# Patient Record
Sex: Male | Born: 1950
Health system: Southern US, Community
[De-identification: ages and names within clinical notes are randomized; demographics above are authoritative.]

## PROBLEM LIST (undated history)

## (undated) ENCOUNTER — Emergency Department (HOSPITAL_COMMUNITY): Admission: EM | Payer: Self-pay | Source: Home / Self Care

## (undated) DIAGNOSIS — Z950 Presence of cardiac pacemaker: Secondary | ICD-10-CM

## (undated) DIAGNOSIS — L259 Unspecified contact dermatitis, unspecified cause: Secondary | ICD-10-CM

## (undated) DIAGNOSIS — I10 Essential (primary) hypertension: Secondary | ICD-10-CM

## (undated) DIAGNOSIS — N179 Acute kidney failure, unspecified: Secondary | ICD-10-CM

## (undated) DIAGNOSIS — I2510 Atherosclerotic heart disease of native coronary artery without angina pectoris: Secondary | ICD-10-CM

## (undated) DIAGNOSIS — E109 Type 1 diabetes mellitus without complications: Secondary | ICD-10-CM

## (undated) DIAGNOSIS — N189 Chronic kidney disease, unspecified: Secondary | ICD-10-CM

## (undated) DIAGNOSIS — I6350 Cerebral infarction due to unspecified occlusion or stenosis of unspecified cerebral artery: Secondary | ICD-10-CM

## (undated) DIAGNOSIS — I509 Heart failure, unspecified: Secondary | ICD-10-CM

## (undated) DIAGNOSIS — E785 Hyperlipidemia, unspecified: Secondary | ICD-10-CM

## (undated) DIAGNOSIS — Z95810 Presence of automatic (implantable) cardiac defibrillator: Secondary | ICD-10-CM

## (undated) DIAGNOSIS — I219 Acute myocardial infarction, unspecified: Secondary | ICD-10-CM

## (undated) DIAGNOSIS — E039 Hypothyroidism, unspecified: Secondary | ICD-10-CM

## (undated) DIAGNOSIS — Z87442 Personal history of urinary calculi: Secondary | ICD-10-CM

## (undated) HISTORY — PX: TONSILLECTOMY: SUR1361

## (undated) HISTORY — DX: Hyperlipidemia, unspecified: E78.5

## (undated) HISTORY — PX: WISDOM TOOTH EXTRACTION: SHX21

## (undated) HISTORY — DX: Atherosclerotic heart disease of native coronary artery without angina pectoris: I25.10

## (undated) HISTORY — DX: Acute kidney failure, unspecified: N17.9

## (undated) HISTORY — PX: TONSILECTOMY, ADENOIDECTOMY, BILATERAL MYRINGOTOMY AND TUBES: SHX2538

## (undated) HISTORY — DX: Unspecified contact dermatitis, unspecified cause: L25.9

## (undated) HISTORY — DX: Cerebral infarction due to unspecified occlusion or stenosis of unspecified cerebral artery: I63.50

## (undated) HISTORY — DX: Type 1 diabetes mellitus without complications: E10.9

## (undated) HISTORY — DX: Essential (primary) hypertension: I10

## (undated) HISTORY — PX: INSERT / REPLACE / REMOVE PACEMAKER: SUR710

## (undated) HISTORY — PX: CORONARY STENT PLACEMENT: SHX1402

## (undated) HISTORY — DX: Chronic kidney disease, unspecified: N18.9

---
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
## 1997-06-28 ENCOUNTER — Ambulatory Visit (HOSPITAL_COMMUNITY): Admission: RE | Admit: 1997-06-28 | Discharge: 1997-06-28 | Payer: Self-pay | Admitting: Podiatry

## 2001-08-04 ENCOUNTER — Encounter (HOSPITAL_COMMUNITY): Admission: RE | Admit: 2001-08-04 | Discharge: 2001-11-02 | Payer: Self-pay | Admitting: Cardiovascular Disease

## 2001-08-26 ENCOUNTER — Ambulatory Visit (HOSPITAL_COMMUNITY): Admission: RE | Admit: 2001-08-26 | Discharge: 2001-08-26 | Payer: Self-pay | Admitting: Cardiovascular Disease

## 2001-09-01 ENCOUNTER — Encounter: Admission: RE | Admit: 2001-09-01 | Discharge: 2001-11-04 | Payer: Self-pay | Admitting: Cardiovascular Disease

## 2002-01-15 ENCOUNTER — Encounter: Payer: Self-pay | Admitting: Cardiovascular Disease

## 2002-01-15 ENCOUNTER — Ambulatory Visit (HOSPITAL_COMMUNITY): Admission: RE | Admit: 2002-01-15 | Discharge: 2002-01-15 | Payer: Self-pay | Admitting: Cardiovascular Disease

## 2005-01-07 ENCOUNTER — Ambulatory Visit: Payer: Self-pay | Admitting: Internal Medicine

## 2005-01-18 ENCOUNTER — Ambulatory Visit: Payer: Self-pay | Admitting: Internal Medicine

## 2005-03-01 ENCOUNTER — Ambulatory Visit: Payer: Self-pay | Admitting: Internal Medicine

## 2005-03-06 ENCOUNTER — Encounter: Admission: RE | Admit: 2005-03-06 | Discharge: 2005-06-04 | Payer: Self-pay | Admitting: Internal Medicine

## 2005-05-02 ENCOUNTER — Ambulatory Visit: Payer: Self-pay | Admitting: Internal Medicine

## 2005-07-04 ENCOUNTER — Ambulatory Visit: Payer: Self-pay | Admitting: Internal Medicine

## 2005-09-06 ENCOUNTER — Ambulatory Visit: Payer: Self-pay | Admitting: Endocrinology

## 2005-09-17 ENCOUNTER — Ambulatory Visit: Payer: Self-pay | Admitting: Endocrinology

## 2005-10-30 ENCOUNTER — Ambulatory Visit: Payer: Self-pay | Admitting: Endocrinology

## 2006-01-03 ENCOUNTER — Ambulatory Visit: Payer: Self-pay | Admitting: Endocrinology

## 2006-04-11 ENCOUNTER — Ambulatory Visit: Payer: Self-pay | Admitting: Internal Medicine

## 2006-04-11 LAB — CONVERTED CEMR LAB
ALT: 40 units/L (ref 0–40)
AST: 35 units/L (ref 0–37)
Albumin: 3.4 g/dL — ABNORMAL LOW (ref 3.5–5.2)
Alkaline Phosphatase: 67 units/L (ref 39–117)
BUN: 19 mg/dL (ref 6–23)
Basophils Absolute: 0.1 10*3/uL (ref 0.0–0.1)
Basophils Relative: 0.9 % (ref 0.0–1.0)
Bilirubin, Direct: 0.1 mg/dL (ref 0.0–0.3)
CO2: 32 meq/L (ref 19–32)
Calcium: 9.3 mg/dL (ref 8.4–10.5)
Chloride: 104 meq/L (ref 96–112)
Cholesterol: 154 mg/dL (ref 0–200)
Creatinine, Ser: 1.1 mg/dL (ref 0.4–1.5)
Creatinine,U: 197.7 mg/dL
Eosinophils Absolute: 0.4 10*3/uL (ref 0.0–0.6)
Eosinophils Relative: 4.9 % (ref 0.0–5.0)
GFR calc Af Amer: 90 mL/min
GFR calc non Af Amer: 74 mL/min
Glucose, Bld: 167 mg/dL — ABNORMAL HIGH (ref 70–99)
HCT: 43.9 % (ref 39.0–52.0)
HDL: 54 mg/dL (ref >39.0)
Hemoglobin: 15.4 g/dL (ref 13.0–17.0)
Hgb A1c MFr Bld: 7.2 % — ABNORMAL HIGH (ref 4.6–6.0)
LDL Cholesterol: 87 mg/dL (ref 0–99)
Lymphocytes Relative: 18.3 % (ref 12.0–46.0)
MCHC: 35 g/dL (ref 30.0–36.0)
MCV: 95 fL (ref 78.0–100.0)
Microalb Creat Ratio: 4 mg/g (ref 0.0–30.0)
Microalb, Ur: 0.8 mg/dL (ref 0.0–1.9)
Monocytes Absolute: 1.5 10*3/uL — ABNORMAL HIGH (ref 0.2–0.7)
Monocytes Relative: 17.6 % — ABNORMAL HIGH (ref 3.0–11.0)
Neutro Abs: 5 10*3/uL (ref 1.4–7.7)
Neutrophils Relative %: 58.3 % (ref 43.0–77.0)
PSA: 0.95 ng/mL (ref 0.10–4.00)
Platelets: 210 10*3/uL (ref 150–400)
Potassium: 5 meq/L (ref 3.5–5.1)
RBC: 4.63 M/uL (ref 4.22–5.81)
RDW: 12.1 % (ref 11.5–14.6)
Sodium: 141 meq/L (ref 135–145)
TSH: 3.98 microintl units/mL (ref 0.35–5.50)
Total Bilirubin: 0.7 mg/dL (ref 0.3–1.2)
Total CHOL/HDL Ratio: 2.9
Total Protein: 6.5 g/dL (ref 6.0–8.3)
Triglycerides: 65 mg/dL (ref 0–149)
VLDL: 13 mg/dL (ref 0–40)
WBC: 8.6 10*3/uL (ref 4.5–10.5)

## 2006-04-18 ENCOUNTER — Ambulatory Visit: Payer: Self-pay | Admitting: Internal Medicine

## 2006-05-09 ENCOUNTER — Ambulatory Visit: Payer: Self-pay | Admitting: Endocrinology

## 2006-05-12 ENCOUNTER — Ambulatory Visit: Payer: Self-pay

## 2006-05-14 ENCOUNTER — Ambulatory Visit: Payer: Self-pay | Admitting: Cardiovascular Disease

## 2006-05-30 ENCOUNTER — Ambulatory Visit: Payer: Self-pay | Admitting: Cardiovascular Disease

## 2006-05-30 LAB — CONVERTED CEMR LAB
BUN: 21 mg/dL (ref 6–23)
Basophils Absolute: 0 10*3/uL (ref 0.0–0.1)
Basophils Relative: 0.5 % (ref 0.0–1.0)
CO2: 31 meq/L (ref 19–32)
Calcium: 9 mg/dL (ref 8.4–10.5)
Chloride: 110 meq/L (ref 96–112)
Creatinine, Ser: 1.2 mg/dL (ref 0.4–1.5)
Eosinophils Absolute: 0.3 10*3/uL (ref 0.0–0.6)
Eosinophils Relative: 3.8 % (ref 0.0–5.0)
GFR calc Af Amer: 81 mL/min
GFR calc non Af Amer: 67 mL/min
Glucose, Bld: 122 mg/dL — ABNORMAL HIGH (ref 70–99)
HCT: 40.7 % (ref 39.0–52.0)
Hemoglobin: 14.4 g/dL (ref 13.0–17.0)
INR: 0.8 — ABNORMAL LOW (ref 0.9–2.0)
Lymphocytes Relative: 23.5 % (ref 12.0–46.0)
MCHC: 35.4 g/dL (ref 30.0–36.0)
MCV: 94.7 fL (ref 78.0–100.0)
Monocytes Absolute: 1 10*3/uL — ABNORMAL HIGH (ref 0.2–0.7)
Monocytes Relative: 12.1 % — ABNORMAL HIGH (ref 3.0–11.0)
Neutro Abs: 4.7 10*3/uL (ref 1.4–7.7)
Neutrophils Relative %: 60.1 % (ref 43.0–77.0)
Platelets: 232 10*3/uL (ref 150–400)
Potassium: 4.2 meq/L (ref 3.5–5.1)
Prothrombin Time: 10.9 s (ref 10.0–14.0)
RBC: 4.3 M/uL (ref 4.22–5.81)
RDW: 11.9 % (ref 11.5–14.6)
Sodium: 144 meq/L (ref 135–145)
WBC: 7.9 10*3/uL (ref 4.5–10.5)
aPTT: 21.1 s — ABNORMAL LOW (ref 26.5–36.5)

## 2006-06-02 ENCOUNTER — Ambulatory Visit: Payer: Self-pay | Admitting: Cardiovascular Disease

## 2006-06-02 ENCOUNTER — Inpatient Hospital Stay (HOSPITAL_BASED_OUTPATIENT_CLINIC_OR_DEPARTMENT_OTHER): Admission: RE | Admit: 2006-06-02 | Discharge: 2006-06-02 | Payer: Self-pay | Admitting: Cardiovascular Disease

## 2006-06-03 ENCOUNTER — Ambulatory Visit (HOSPITAL_COMMUNITY): Admission: RE | Admit: 2006-06-03 | Discharge: 2006-06-03 | Payer: Self-pay | Admitting: Cardiovascular Disease

## 2006-06-16 ENCOUNTER — Ambulatory Visit: Payer: Self-pay | Admitting: Internal Medicine

## 2006-08-06 ENCOUNTER — Ambulatory Visit: Payer: Self-pay | Admitting: Internal Medicine

## 2006-08-11 DIAGNOSIS — I10 Essential (primary) hypertension: Secondary | ICD-10-CM

## 2006-08-11 DIAGNOSIS — I252 Old myocardial infarction: Secondary | ICD-10-CM

## 2006-08-11 DIAGNOSIS — E785 Hyperlipidemia, unspecified: Secondary | ICD-10-CM

## 2006-08-11 DIAGNOSIS — Z9861 Coronary angioplasty status: Secondary | ICD-10-CM

## 2006-08-11 DIAGNOSIS — I2510 Atherosclerotic heart disease of native coronary artery without angina pectoris: Secondary | ICD-10-CM

## 2006-08-11 DIAGNOSIS — E109 Type 1 diabetes mellitus without complications: Secondary | ICD-10-CM

## 2006-08-11 HISTORY — DX: Hyperlipidemia, unspecified: E78.5

## 2006-08-11 HISTORY — DX: Type 1 diabetes mellitus without complications: E10.9

## 2006-08-11 HISTORY — DX: Atherosclerotic heart disease of native coronary artery without angina pectoris: I25.10

## 2006-08-11 HISTORY — DX: Essential (primary) hypertension: I10

## 2006-08-15 ENCOUNTER — Ambulatory Visit: Payer: Self-pay | Admitting: Endocrinology

## 2006-10-31 ENCOUNTER — Encounter: Payer: Self-pay | Admitting: *Deleted

## 2006-12-13 ENCOUNTER — Encounter: Payer: Self-pay | Admitting: Emergency Medicine

## 2006-12-14 ENCOUNTER — Ambulatory Visit: Payer: Self-pay | Admitting: Cardiology

## 2006-12-14 ENCOUNTER — Ambulatory Visit: Payer: Self-pay | Admitting: *Deleted

## 2006-12-14 ENCOUNTER — Ambulatory Visit: Payer: Self-pay | Admitting: Internal Medicine

## 2006-12-14 ENCOUNTER — Inpatient Hospital Stay (HOSPITAL_COMMUNITY): Admission: EM | Admit: 2006-12-14 | Discharge: 2006-12-15 | Payer: Self-pay | Admitting: Internal Medicine

## 2006-12-15 ENCOUNTER — Ambulatory Visit: Payer: Self-pay | Admitting: Internal Medicine

## 2006-12-15 ENCOUNTER — Encounter: Payer: Self-pay | Admitting: Internal Medicine

## 2006-12-15 ENCOUNTER — Telehealth: Payer: Self-pay | Admitting: Internal Medicine

## 2006-12-19 ENCOUNTER — Ambulatory Visit: Payer: Self-pay | Admitting: Internal Medicine

## 2006-12-19 DIAGNOSIS — Z8673 Personal history of transient ischemic attack (TIA), and cerebral infarction without residual deficits: Secondary | ICD-10-CM

## 2006-12-19 DIAGNOSIS — I6350 Cerebral infarction due to unspecified occlusion or stenosis of unspecified cerebral artery: Secondary | ICD-10-CM

## 2006-12-19 HISTORY — DX: Cerebral infarction due to unspecified occlusion or stenosis of unspecified cerebral artery: I63.50

## 2006-12-23 ENCOUNTER — Telehealth: Payer: Self-pay | Admitting: Internal Medicine

## 2007-01-20 ENCOUNTER — Ambulatory Visit: Payer: Self-pay | Admitting: Internal Medicine

## 2007-02-09 ENCOUNTER — Ambulatory Visit: Payer: Self-pay | Admitting: Internal Medicine

## 2007-02-11 ENCOUNTER — Telehealth: Payer: Self-pay | Admitting: Internal Medicine

## 2007-02-19 ENCOUNTER — Ambulatory Visit: Payer: Self-pay | Admitting: Endocrinology

## 2007-03-27 ENCOUNTER — Encounter: Payer: Self-pay | Admitting: Endocrinology

## 2007-06-05 ENCOUNTER — Ambulatory Visit: Payer: Self-pay | Admitting: Internal Medicine

## 2007-06-05 LAB — CONVERTED CEMR LAB
ALT: 24 units/L (ref 0–53)
AST: 25 units/L (ref 0–37)
Albumin: 3.4 g/dL — ABNORMAL LOW (ref 3.5–5.2)
Alkaline Phosphatase: 61 units/L (ref 39–117)
Bilirubin, Direct: 0.1 mg/dL (ref 0.0–0.3)
Cholesterol: 155 mg/dL (ref 0–200)
HDL: 39.2 mg/dL (ref >39.0)
Hgb A1c MFr Bld: 8.1 % — ABNORMAL HIGH (ref 4.6–6.0)
LDL Cholesterol: 101 mg/dL — ABNORMAL HIGH (ref 0–99)
Total Bilirubin: 0.8 mg/dL (ref 0.3–1.2)
Total CHOL/HDL Ratio: 4
Total Protein: 5.8 g/dL — ABNORMAL LOW (ref 6.0–8.3)
Triglycerides: 73 mg/dL (ref 0–149)
VLDL: 15 mg/dL (ref 0–40)

## 2007-06-26 ENCOUNTER — Ambulatory Visit: Payer: Self-pay | Admitting: Internal Medicine

## 2007-10-09 ENCOUNTER — Ambulatory Visit: Payer: Self-pay | Admitting: Internal Medicine

## 2007-10-09 LAB — CONVERTED CEMR LAB
ALT: 24 units/L (ref 0–53)
AST: 25 units/L (ref 0–37)
Albumin: 3.2 g/dL — ABNORMAL LOW (ref 3.5–5.2)
Alkaline Phosphatase: 60 units/L (ref 39–117)
BUN: 17 mg/dL (ref 6–23)
Bilirubin, Direct: 0.1 mg/dL (ref 0.0–0.3)
CO2: 31 meq/L (ref 19–32)
Calcium: 8.7 mg/dL (ref 8.4–10.5)
Chloride: 113 meq/L — ABNORMAL HIGH (ref 96–112)
Cholesterol: 137 mg/dL (ref 0–200)
Creatinine, Ser: 1.1 mg/dL (ref 0.4–1.5)
GFR calc Af Amer: 89 mL/min
GFR calc non Af Amer: 74 mL/min
Glucose, Bld: 100 mg/dL — ABNORMAL HIGH (ref 70–99)
HDL: 43.7 mg/dL (ref >39.0)
Hgb A1c MFr Bld: 7.5 % — ABNORMAL HIGH (ref 4.6–6.0)
LDL Cholesterol: 81 mg/dL (ref 0–99)
Potassium: 4.5 meq/L (ref 3.5–5.1)
Sodium: 147 meq/L — ABNORMAL HIGH (ref 135–145)
Total Bilirubin: 0.7 mg/dL (ref 0.3–1.2)
Total CHOL/HDL Ratio: 3.1
Total Protein: 6.2 g/dL (ref 6.0–8.3)
Triglycerides: 64 mg/dL (ref 0–149)
VLDL: 13 mg/dL (ref 0–40)

## 2007-10-23 ENCOUNTER — Ambulatory Visit: Payer: Self-pay | Admitting: Internal Medicine

## 2007-11-20 ENCOUNTER — Encounter: Payer: Self-pay | Admitting: Internal Medicine

## 2007-12-24 ENCOUNTER — Telehealth (INDEPENDENT_AMBULATORY_CARE_PROVIDER_SITE_OTHER): Payer: Self-pay | Admitting: *Deleted

## 2008-01-28 ENCOUNTER — Telehealth (INDEPENDENT_AMBULATORY_CARE_PROVIDER_SITE_OTHER): Payer: Self-pay | Admitting: *Deleted

## 2008-02-12 ENCOUNTER — Ambulatory Visit: Payer: Self-pay | Admitting: Internal Medicine

## 2008-02-12 LAB — CONVERTED CEMR LAB
ALT: 26 units/L (ref 0–53)
AST: 25 units/L (ref 0–37)
Albumin: 3.6 g/dL (ref 3.5–5.2)
Alkaline Phosphatase: 76 units/L (ref 39–117)
BUN: 19 mg/dL (ref 6–23)
Bilirubin, Direct: 0.1 mg/dL (ref 0.0–0.3)
CO2: 29 meq/L (ref 19–32)
Calcium: 9.1 mg/dL (ref 8.4–10.5)
Chloride: 108 meq/L (ref 96–112)
Creatinine, Ser: 1.2 mg/dL (ref 0.4–1.5)
GFR calc Af Amer: 81 mL/min
GFR calc non Af Amer: 67 mL/min
Glucose, Bld: 317 mg/dL — ABNORMAL HIGH (ref 70–99)
Hgb A1c MFr Bld: 8.3 % — ABNORMAL HIGH (ref 4.6–6.0)
Potassium: 5.3 meq/L — ABNORMAL HIGH (ref 3.5–5.1)
Sodium: 142 meq/L (ref 135–145)
Total Bilirubin: 0.7 mg/dL (ref 0.3–1.2)
Total Protein: 6.2 g/dL (ref 6.0–8.3)

## 2008-02-26 ENCOUNTER — Ambulatory Visit: Payer: Self-pay | Admitting: Internal Medicine

## 2008-04-13 ENCOUNTER — Ambulatory Visit: Payer: Self-pay | Admitting: Endocrinology

## 2008-06-17 ENCOUNTER — Ambulatory Visit: Payer: Self-pay | Admitting: Internal Medicine

## 2008-06-17 LAB — CONVERTED CEMR LAB
Albumin: 3.4 g/dL — ABNORMAL LOW (ref 3.5–5.2)
Alkaline Phosphatase: 66 units/L (ref 39–117)
BUN: 17 mg/dL (ref 6–23)
Bilirubin, Direct: 0 mg/dL (ref 0.0–0.3)
CO2: 30 meq/L (ref 19–32)
Chloride: 112 meq/L (ref 96–112)
Cholesterol: 152 mg/dL (ref 0–200)
Creatinine, Ser: 1.3 mg/dL (ref 0.4–1.5)
Hgb A1c MFr Bld: 8 % — ABNORMAL HIGH (ref 4.6–6.5)
LDL Cholesterol: 94 mg/dL (ref 0–99)
Potassium: 4.6 meq/L (ref 3.5–5.1)
Total CHOL/HDL Ratio: 4
Total Protein: 6.2 g/dL (ref 6.0–8.3)
VLDL: 14.6 mg/dL (ref 0.0–40.0)

## 2008-07-07 ENCOUNTER — Ambulatory Visit: Payer: Self-pay | Admitting: Internal Medicine

## 2008-11-18 ENCOUNTER — Ambulatory Visit: Payer: Self-pay | Admitting: Internal Medicine

## 2008-11-18 LAB — CONVERTED CEMR LAB
ALT: 42 units/L (ref 0–53)
Chloride: 105 meq/L (ref 96–112)
Cholesterol: 173 mg/dL (ref 0–200)
GFR calc non Af Amer: 66.28 mL/min (ref >60)
HDL: 50.9 mg/dL (ref >39.00)
Hgb A1c MFr Bld: 8 % — ABNORMAL HIGH (ref 4.6–6.5)
LDL Cholesterol: 109 mg/dL — ABNORMAL HIGH (ref 0–99)
Potassium: 4 meq/L (ref 3.5–5.1)
Sodium: 139 meq/L (ref 135–145)
Total Protein: 6.2 g/dL (ref 6.0–8.3)
VLDL: 13.4 mg/dL (ref 0.0–40.0)

## 2008-12-02 ENCOUNTER — Ambulatory Visit: Payer: Self-pay | Admitting: Internal Medicine

## 2009-03-10 ENCOUNTER — Ambulatory Visit: Payer: Self-pay | Admitting: Internal Medicine

## 2009-03-10 LAB — CONVERTED CEMR LAB
ALT: 26 units/L (ref 0–53)
AST: 23 units/L (ref 0–37)
Albumin: 3.5 g/dL (ref 3.5–5.2)
Calcium: 8.8 mg/dL (ref 8.4–10.5)
Cholesterol: 138 mg/dL (ref 0–200)
GFR calc non Af Amer: 60.36 mL/min (ref >60)
Glucose, Bld: 161 mg/dL — ABNORMAL HIGH (ref 70–99)
HDL: 57 mg/dL (ref >39.00)
Hgb A1c MFr Bld: 8.2 % — ABNORMAL HIGH (ref 4.6–6.5)
Sodium: 142 meq/L (ref 135–145)
Total Protein: 6.3 g/dL (ref 6.0–8.3)
Triglycerides: 62 mg/dL (ref 0.0–149.0)
VLDL: 12.4 mg/dL (ref 0.0–40.0)

## 2009-03-24 ENCOUNTER — Ambulatory Visit: Payer: Self-pay | Admitting: Internal Medicine

## 2009-04-21 ENCOUNTER — Ambulatory Visit: Payer: Self-pay | Admitting: Endocrinology

## 2009-04-21 LAB — CONVERTED CEMR LAB
Creatinine,U: 31.1 mg/dL
Microalb, Ur: 0.2 mg/dL (ref 0.0–1.9)

## 2009-06-16 ENCOUNTER — Ambulatory Visit: Payer: Self-pay | Admitting: Endocrinology

## 2009-07-14 ENCOUNTER — Ambulatory Visit: Payer: Self-pay | Admitting: Internal Medicine

## 2009-07-14 LAB — CONVERTED CEMR LAB
Albumin: 3.6 g/dL (ref 3.5–5.2)
Alkaline Phosphatase: 68 units/L (ref 39–117)
CO2: 31 meq/L (ref 19–32)
Chloride: 107 meq/L (ref 96–112)
Creatinine, Ser: 1.1 mg/dL (ref 0.4–1.5)
Glucose, Bld: 141 mg/dL — ABNORMAL HIGH (ref 70–99)
LDL Cholesterol: 99 mg/dL (ref 0–99)
Sodium: 146 meq/L — ABNORMAL HIGH (ref 135–145)
Total Bilirubin: 0.9 mg/dL (ref 0.3–1.2)
Total CHOL/HDL Ratio: 3
Triglycerides: 78 mg/dL (ref 0.0–149.0)

## 2009-08-08 ENCOUNTER — Ambulatory Visit: Payer: Self-pay | Admitting: Internal Medicine

## 2009-08-24 ENCOUNTER — Ambulatory Visit: Payer: Self-pay | Admitting: Psychology

## 2009-09-08 ENCOUNTER — Ambulatory Visit: Payer: Self-pay | Admitting: Endocrinology

## 2009-09-08 ENCOUNTER — Ambulatory Visit: Payer: Self-pay | Admitting: Psychology

## 2009-09-08 LAB — CONVERTED CEMR LAB: Hgb A1c MFr Bld: 8.2 % — ABNORMAL HIGH (ref 4.6–6.5)

## 2009-09-21 ENCOUNTER — Telehealth: Payer: Self-pay | Admitting: Endocrinology

## 2009-10-27 ENCOUNTER — Ambulatory Visit: Payer: Self-pay | Admitting: Psychology

## 2009-12-01 ENCOUNTER — Ambulatory Visit: Payer: Self-pay | Admitting: Internal Medicine

## 2009-12-01 LAB — CONVERTED CEMR LAB
BUN: 20 mg/dL (ref 6–23)
Cholesterol: 217 mg/dL — ABNORMAL HIGH (ref 0–200)
Creatinine, Ser: 1.2 mg/dL (ref 0.4–1.5)
Direct LDL: 133 mg/dL
GFR calc non Af Amer: 66.04 mL/min (ref >60)
HDL: 52.6 mg/dL (ref >39.00)
Hgb A1c MFr Bld: 8.4 % — ABNORMAL HIGH (ref 4.6–6.5)
Total Bilirubin: 1.1 mg/dL (ref 0.3–1.2)
VLDL: 16.8 mg/dL (ref 0.0–40.0)

## 2009-12-15 ENCOUNTER — Ambulatory Visit: Payer: Self-pay | Admitting: Internal Medicine

## 2009-12-15 ENCOUNTER — Ambulatory Visit: Payer: Self-pay | Admitting: Endocrinology

## 2009-12-15 DIAGNOSIS — L259 Unspecified contact dermatitis, unspecified cause: Secondary | ICD-10-CM

## 2009-12-15 DIAGNOSIS — L309 Dermatitis, unspecified: Secondary | ICD-10-CM

## 2009-12-15 HISTORY — DX: Unspecified contact dermatitis, unspecified cause: L25.9

## 2010-01-12 ENCOUNTER — Encounter: Payer: Self-pay | Admitting: Endocrinology

## 2010-03-06 NOTE — Assessment & Plan Note (Signed)
Summary: F/U PER PT/CD   Vital Signs:  Patient profile:   60 year old male Height:      69 inches (175.26 cm) Weight:      188.31 pounds (85.60 kg) O2 Sat:      97 % on Room air Temp:     97.6 degrees F (36.44 degrees C) oral Pulse rate:   73 / minute BP sitting:   128 / 80  (left arm) Cuff size:   regular  Vitals Entered By: Josph Macho RMA (April 21, 2009 9:27 AM)  O2 Flow:  Room air CC: Follow-up visit/ CF Is Patient Diabetic? Yes   CC:  Follow-up visit/ CF.  History of Present Illness: pt states he feels well in general.  no cbg record, but states cbg's are often low in the afternoon.  he says it is highest in am (higher than at hs, even if no hs-snack).  Current Medications (verified): 1)  Ramipril 5 Mg  Caps (Ramipril) .... 2 By Mouth Once Daily or As Directed 2)  Lipitor 80 Mg  Tabs (Atorvastatin Calcium) .... Take 1 By Mouth Qhs 3)  Nitroglycerin 0.4 Mg Subl (Nitroglycerin) .... One Tablet Under Tongue Every 5 Minutes As Needed For Chest Pain---May Repeat Times Three 4)  Advil 200 Mg Tabs (Ibuprofen) .... Take 1 Tablet By Mouth Every Eight Hours As Needed 5)  Co Q-10 100 Mg Caps (Coenzyme Q10) .... Take Once A Day 6)  Freestyle Lancets  Misc (Lancets) 7)  Levemir Flexpen 100 Unit/ml Soln (Insulin Detemir) .... Inject 14  Unit Subcutaneously  Every Night 8)  Novolog Flexpen 100 Unit/ml Soln (Insulin Aspart) .... Tid (Qac) 11-18-10 Units 9)  Aspirin Ec 325 Mg  Tbec (Aspirin) .... Two Times A Day 10)  Freestyle Test   Strp (Glucose Blood) .... Qid, and Lancets, Variable Glucoses 250.01 11)  Mucinex Dm 30-600 Mg Xr12h-Tab (Dextromethorphan-Guaifenesin) .[name].. 1 By Mouth Qd 12)  Bd U/f Short Pen Needle 31g X 8 Mm Misc (Insulin Pen Needle) .... Qid  Allergies (verified): 1)  * No Known Drug Allergies Group  Past History:  Past Medical History: Last updated: 12/19/2006 Diabetes mellitus, type I Hyperlipidemia Hypertension Myocardial infarction, hx of  07/14/01 Coronary artery disease stroke - 2008  Social History: Reviewed history from 04/13/2008 and no changes required. 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    Physical Exam  General:  normal appearance.   Pulses:  dorsalis pedis intact bilat.   Extremities:  no deformity.  no ulcer on the feet.  feet are of normal color and temp.  no edema  Neurologic:  sensation is intact to touch on the feet  Additional Exam:  a1c=8.2   Impression & Recommendations:  Problem # 1:  DIABETES MELLITUS, TYPE I (ICD-250.01) the pattern of cbg's indicates he needs more levemir, and less ovolog  Medications Added to Medication List This Visit: 1)  Levemir Flexpen 100 Unit/ml Soln (Insulin detemir) .... Inject 15  unit subcutaneously  every night 2)  Novolog Flexpen 100 Unit/ml Soln (Insulin aspart) .... Tid (qac) 11-17-10 units  Other Orders: TLB-Microalbumin/Creat Ratio, Urine (82043-MALB) Est. Patient Level III ([address]109)  Patient Instructions: 1)  increase levemir to 15 units at night. 2)  decrease novolog to (just before each meal) 11-17-10 units 3)  check your blood sugar 4 times a day--before the 3 meals, and at bedtime.  also check if you have symptoms of your blood  sugar being too high or too low.  please keep a record of the readings and bring it to your next appointment here.  please call us sooner if you are having low blood sugar episodes. 4)  Please schedule a follow-up appointment in 2 months.

## 2010-03-06 NOTE — Assessment & Plan Note (Signed)
Summary: 4 mo rov/mm PT RSC/NJR   Vital Signs:  Patient profile:   60 year old male Weight:      186 pounds BMI:     27.57 Temp:     98.1 degrees F oral Pulse rate:   66 / minute Pulse rhythm:   regular Resp:     12 per minute BP sitting:   106 / 78  (left arm) Cuff size:   regular  Vitals Entered By: Gladis Riffle, RN (August 08, 2009 9:12 AM) CC: 4 month rov, labs done--CBGs usually 110-118 in AM except last week 200 at home, lower in PM--recovering from the "crud" Is Patient Diabetic? Yes Did you bring your meter with you today? Yes   CC:  4 month rov, labs done--CBGs usually 110-118 in AM except last week 200 at home, and lower in PM--recovering from the "crud".  History of Present Illness:  Follow-Up Visit      This is a 60 year old man who presents for Follow-up visit.  The patient denies chest pain and palpitations.  Since the last visit the patient notes no new problems or concerns.  The patient reports taking meds as prescribed.  When questioned about possible medication side effects, the patient notes none.    All other systems reviewed and were negative   Preventive Screening-Counseling & Management  Alcohol-Tobacco     Smoking Status: quit     Year Quit: 2002  Current Problems (verified): 1)  Cva  (ICD-434.91) 2)  Coronary Artery Disease  (ICD-414.00) 3)  Myocardial Infarction, Hx of  (ICD-412) 4)  Hypertension  (ICD-401.9) 5)  Hyperlipidemia  (ICD-272.4) 6)  Diabetes Mellitus, Type I  (ICD-250.01)  Current Medications (verified): 1)  Ramipril 5 Mg  Caps (Ramipril) .... 2 By Mouth Once Daily or As Directed 2)  Lipitor 80 Mg  Tabs (Atorvastatin Calcium) .... Take 1 By Mouth Qhs 3)  Nitroglycerin 0.4 Mg Subl (Nitroglycerin) .... One Tablet Under Tongue Every 5 Minutes As Needed For Chest Pain---May Repeat Times Three 4)  Advil 200 Mg Tabs (Ibuprofen) .... Take 1 Tablet By Mouth Every Eight Hours As Needed 5)  Co Q-10 100 Mg Caps (Coenzyme Q10) .... Take Once A  Day 6)  Freestyle Lancets  Misc (Lancets) 7)  Levemir Flexpen 100 Unit/ml Soln (Insulin Detemir) .... Inject 16  Unit Subcutaneously  Every Night 8)  Novolog Flexpen 100 Unit/ml Soln (Insulin Aspart) .... Tid (Qac) 11-16-10 Units 9)  Aspirin Ec 325 Mg  Tbec (Aspirin) .... Two Times A Day 10)  Freestyle Test   Strp (Glucose Blood) .... Qid, and Lancets, Variable Glucoses 250.01 11)  Mucinex Dm 30-600 Mg Xr12h-Tab (Dextromethorphan-Guaifenesin) .[name].. 1 By Mouth Qd 12)  Bd U/f Short Pen Needle 31g X 8 Mm Misc (Insulin Pen Needle) .... 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   Head:  normocephalic and atraumatic.   Eyes:  pupils equal and pupils round.   Ears:  R ear normal and L ear normal.  Nose:  no external deformity and no external erythema.   Neck:  supple, full ROM, and no masses.   Chest Wall:  No deformities, masses, tenderness or gynecomastia noted. Lungs:  normal respiratory effort and no accessory muscle use.   Heart:  normal rate and regular rhythm.   Abdomen:  soft, non-tender, normal bowel sounds, no distention, no masses, no guarding, and no rigidity.   Msk:  No deformity or scoliosis noted of thoracic or lumbar spine.   Neurologic:  cranial nerves II-XII intact and gait normal.   Skin:  turgor normal and color normal.   Psych:  good eye contact and not anxious  appearing.     Impression & Recommendations:  Problem # 1:  DIABETES MELLITUS, TYPE I (ICD-250.01)  not adequately controlled continue current medications  home CBGs are variable (50-320) he will try to write down his sliding scale His updated medication list for this problem includes:    Ramipril 5 Mg Caps (Ramipril) .[name]... 2 by mouth once daily or as directed    Levemir Flexpen 100 Unit/ml Soln (Insulin detemir) ..... Inject 16  unit subcutaneously  every night    Novolog Flexpen 100 Unit/ml Soln (Insulin aspart) .[name]... Tid (qac) 11-16-10 units    Aspirin Ec 325 Mg Tbec (Aspirin) .[name]..[name] Two times a day  Labs Reviewed: Creat: 1.1 (07/14/2009)     Last Eye Exam: normal (12/05/2008) Reviewed HgBA1c results: 8.1 (07/14/2009)  8.1 (06/16/2009)  Problem # 2:  CORONARY ARTERY DISEASE (ICD-414.00) controlled continue current medications  His updated medication list for this problem includes:    Ramipril 5 Mg Caps (Ramipril) .[name]... 2 by mouth once daily or as directed    Nitroglycerin 0.4 Mg Subl (Nitroglycerin) ..... One tablet under tongue every 5 minutes as needed for chest pain---may repeat times three    Aspirin Ec 325 Mg Tbec (Aspirin) .[name]..[name] Two times a day  Labs Reviewed: Chol: 166 (07/14/2009)   HDL: 51.80 (07/14/2009)   LDL: 99 (07/14/2009)   TG: 78.0 (07/14/2009)  Problem # 3:  HYPERLIPIDEMIA (ICD-272.4)  His updated medication list for this problem includes:    Lipitor 80 Mg Tabs (Atorvastatin calcium) .[name]... Take 1 by mouth qhs  Labs Reviewed: SGOT: 32 (07/14/2009)   SGPT: 30 (07/14/2009)   HDL:51.80 (07/14/2009), 57.00 (03/10/2009)  LDL:99 (07/14/2009), 69 (03/10/2009)  Chol:166 (07/14/2009), 138 (03/10/2009)  Trig:78.0 (07/14/2009), 62.0 (03/10/2009)  Problem # 4:  HYPERTENSION (ICD-401.9) conttrolled continue current medications  His updated medication list for this problem includes:    Ramipril 5 Mg Caps (Ramipril) .[name]... 2 by mouth once daily or as directed  BP  today: 106/78 Prior BP: 110/80 (06/16/2009)  Labs Reviewed: K+: 4.1 (07/14/2009) Creat: : 1.1 (07/14/2009)   Chol: 166 (07/14/2009)   HDL: 51.80 (07/14/2009)   LDL: 99 (07/14/2009)   TG: 78.0 (07/14/2009)  Complete Medication List: 1)  Ramipril 5 Mg Caps (Ramipril) .... 2 by mouth once daily or as directed 2)  Lipitor 80 Mg Tabs (Atorvastatin calcium) .... Take 1 by mouth qhs 3)  Nitroglycerin 0.4 Mg Subl (Nitroglycerin) .... One tablet under tongue every 5 minutes as needed for chest pain---may repeat times three 4)  Advil 200 Mg Tabs (Ibuprofen) .... Take 1 tablet by mouth every eight hours as needed 5)  Co Q-10 100 Mg Caps (Coenzyme q10) .... Take once a day 6)  Freestyle Lancets Misc (Lancets) 7)  Levemir Flexpen 100 Unit/ml Soln (Insulin detemir) .... Inject 16  unit subcutaneously  every night 8)  Novolog Flexpen 100 Unit/ml Soln (Insulin aspart) .... Tid (qac) 11-16-10 units 9)  Aspirin Ec 325 Mg Tbec (Aspirin) .... Two times a day 10)  Freestyle Test Strp (Glucose blood) .... Qid, and lancets, variable glucoses 250.01 11)  Mucinex Dm 30-600 Mg Xr12h-tab (Dextromethorphan-guaifenesin) .[name].. 1 by mouth qd 12)  Bd U/f Short Pen Needle 31g X 8 Mm Misc (Insulin pen needle) .... Qid  Other Orders: Psychology Referral (Psychology)  Patient Instructions: 1)  Please schedule a follow-up appointment in 4 months. 2)  labs one week prior to visit 3)  lipids---272.4 4)  lfts-995.2 5)  bmet-995.2 6)  A1C-250.02 7)

## 2010-03-06 NOTE — Assessment & Plan Note (Signed)
Summary: 4 MONTH FUP//CCM   Vital Signs:  Patient profile:   60 year old male Weight:      185 pounds Temp:     98.7 degrees F oral Pulse rate:   68 / minute Pulse rhythm:   regular BP sitting:   112 / 72  (left arm) Cuff size:   regular  Vitals Entered By: Alfred Levins, CMA (December 15, 2009 8:02 AM) CC: f/u on labs   CC:  f/u on labs.  History of Present Illness:  Follow-Up Visit      This is a 60 year old man who presents for Follow-up visit.  The patient denies chest pain and palpitations.  Since the last visit the patient notes no new problems or concerns.  The patient reports taking meds as prescribed, monitoring blood sugars, and dietary compliance.  When questioned about possible medication side effects, the patient notes none.  (not taking lipitor)  All other systems reviewed and were negative   Current Problems (verified): 1)  Anger  (ICD-312.00) 2)  Cva  (ICD-434.91) 3)  Coronary Artery Disease  (ICD-414.00) 4)  Myocardial Infarction, Hx of  (ICD-412) 5)  Hypertension  (ICD-401.9) 6)  Hyperlipidemia  (ICD-272.4) 7)  Diabetes Mellitus, Type I  (ICD-250.01)  Current Medications (verified): 1)  Ramipril 5 Mg  Caps (Ramipril) .... 2 By Mouth Once Daily or As Directed 2)  Nitroglycerin 0.4 Mg Subl (Nitroglycerin) .... One Tablet Under Tongue Every 5 Minutes As Needed For Chest Pain---May Repeat Times Three 3)  Advil 200 Mg Tabs (Ibuprofen) .... Take 1 Tablet By Mouth Every Eight Hours As Needed 4)  Co Q-10 100 Mg Caps (Coenzyme Q10) .... Take Once A Day 5)  Freestyle Lancets  Misc (Lancets) 6)  Aspirin Ec 325 Mg  Tbec (Aspirin) .... Two Times A Day 7)  Freestyle Test   Strp (Glucose Blood) .... Qid, and Lancets, Variable Glucoses 250.01 8)  Mucinex Dm 30-600 Mg Xr12h-Tab (Dextromethorphan-Guaifenesin) .[name].. 1 By Mouth Qd 9)  Bd U/f Short Pen Needle 31g X 8 Mm Misc (Insulin Pen Needle) .... Qid 10)  Novolog Mix 70/30 Flexpen 70-30 % Susp (Insulin Aspart Prot &  Aspart) .... 26 Units Am (With Breakfast) and 12 Units Pm (With Evening Meal), and Pen Needles Bid  Allergies (verified): No Known Drug Allergies  Physical Exam  General:  well-developed well-nourished male in no acute distress. HEENT exam atraumatic, normocephalic. Chest clear to auscultation cardiac exam S1-S2 are regular. Neck is supple without lymphadenopathy or jugular venous distention. Abdominal exam active bowel sounds, soft, nontender. Extremities with no clubbing cyanosis or edema. Peripheral pulses are normal.   Impression & Recommendations:  Problem # 1:  ANGER (ICD-312.00) resolved  Problem # 2:  CORONARY ARTERY DISEASE (ICD-414.00) no symptoms. Continue current medications. His updated medication list for this problem includes:    Ramipril 5 Mg Caps (Ramipril) .[name]... 2 by mouth once daily or as directed    Nitroglycerin 0.4 Mg Subl (Nitroglycerin) ..... One tablet under tongue every 5 minutes as needed for chest pain---may repeat times three    Aspirin Ec 325 Mg Tbec (Aspirin) .[name]..[name] Two times a day  Labs Reviewed: Chol: 217 (12/01/2009)   HDL: 52.60 (12/01/2009)   LDL: 99 (07/14/2009)   TG: 84.0 (12/01/2009)  Problem # 3:  DIABETES MELLITUS, TYPE I (ICD-250.01) controlled continue current medications  His updated medication list for this problem includes:    Ramipril 5 Mg Caps (Ramipril) .[name]... 2 by mouth once daily or  as directed    Aspirin Ec 325 Mg Tbec (Aspirin) .[name]..[name] Two times a day    Novolog Mix 70/30 Flexpen 70-30 % Susp (Insulin aspart prot & aspart) .[name]... 26 units am (with breakfast) and 12 units pm (with evening meal), and pen needles bid  Labs Reviewed: Creat: 1.2 (12/01/2009)     Last Eye Exam: normal (12/05/2008) Reviewed HgBA1c results: 8.4 (12/01/2009)  8.2 (09/08/2009)  Problem # 4:  CVA (ICD-434.91) no recurrence His updated medication list for this problem includes:    Aspirin Ec 325 Mg Tbec (Aspirin) .[name]..[name] Two times a day  Complete  Medication List: 1)  Ramipril 5 Mg Caps (Ramipril) .... 2 by mouth once daily or as directed 2)  Nitroglycerin 0.4 Mg Subl (Nitroglycerin) .... One tablet under tongue every 5 minutes as needed for chest pain---may repeat times three 3)  Advil 200 Mg Tabs (Ibuprofen) .... Take 1 tablet by mouth every eight hours as needed 4)  Co Q-10 100 Mg Caps (Coenzyme q10) .... Take once a day 5)  Freestyle Lancets Misc (Lancets) 6)  Aspirin Ec 325 Mg Tbec (Aspirin) .... Two times a day 7)  Freestyle Test Strp (Glucose blood) .... Qid, and lancets, variable glucoses 250.01 8)  Mucinex Dm 30-600 Mg Xr12h-tab (Dextromethorphan-guaifenesin) .[name].. 1 by mouth qd 9)  Bd U/f Short Pen Needle 31g X 8 Mm Misc (Insulin pen needle) .... Qid 10)  Novolog Mix 70/30 Flexpen 70-30 % Susp (Insulin aspart prot & aspart) .... 26 units am (with breakfast) and 12 units pm (with evening meal), and pen needles bid 11)  Lipitor 40 Mg Tabs (Atorvastatin calcium) .... Take 1 tab by mouth daily 12)  Triamcinolone Acetonide 0.5 % Oint (Triamcinolone acetonide) .... Apply at bedtime and wear latex gloves at night  Patient Instructions: 1)  Please schedule a follow-up appointment in 4 months. 2)  labs one week prior to visit 3)  lipids---272.4 4)  lfts-995.2 5)  bmet-995.2 6)  A1C-250.02 7)     Prescriptions: TRIAMCINOLONE ACETONIDE 0.5 % OINT (TRIAMCINOLONE ACETONIDE) apply at bedtime and wear latex gloves at night  #60 grams x 2   Entered and Authorized by:   Birdie Sons MD   Signed by:   Birdie Sons MD on 12/15/2009   Method used:   Electronically to        [facility] [phone]* (retail)       [address]       [place], [place]  [address]       Ph: [phone] or [phone]       Fax: [phone]   RxID:   [id number] LIPITOR 40 MG TABS (ATORVASTATIN CALCIUM) Take 1 tab by mouth daily  #90 x 3   Entered and Authorized by:   Birdie Sons MD   Signed by:   Birdie Sons MD on  12/15/2009   Method used:   Electronically to        [facility] [phone]* (retail)       [address]       [place], [place]  [address]       Ph: [phone] or [phone]       Fax: [phone]   RxID:   [id number]    Orders Added: 1)  Est. Patient Level IV [70623]

## 2010-03-06 NOTE — Assessment & Plan Note (Signed)
Summary: 3 month follow up-lb   Vital Signs:  Patient profile:   60 year old male Height:      69 inches (175.26 cm) Weight:      188.13 pounds (85.51 kg) BMI:     27.88 O2 Sat:      97 % on Room air Temp:     97.3 degrees F (36.28 degrees C) oral Pulse rate:   67 / minute Pulse rhythm:   regular BP sitting:   120 / 78  (left arm) Cuff size:   regular  Vitals Entered By: Brenton Grills CMA Duncan Dull) (December 15, 2009 11:19 AM)  O2 Flow:  Room air CC: 3 month F/U/aj Is Patient Diabetic? Yes   CC:  3 month F/U/aj.  History of Present Illness: pt says he likes the two times a day insulin.  no cbg record, but states cbg's are 250 in am and 150 in the afternoon.  pt states he feels well in general.  Current Medications (verified): 1)  Ramipril 5 Mg  Caps (Ramipril) .... 2 By Mouth Once Daily or As Directed 2)  Nitroglycerin 0.4 Mg Subl (Nitroglycerin) .... One Tablet Under Tongue Every 5 Minutes As Needed For Chest Pain---May Repeat Times Three 3)  Advil 200 Mg Tabs (Ibuprofen) .... Take 1 Tablet By Mouth Every Eight Hours As Needed 4)  Co Q-10 100 Mg Caps (Coenzyme Q10) .... Take Once A Day 5)  Freestyle Lancets  Misc (Lancets) 6)  Aspirin Ec 325 Mg  Tbec (Aspirin) .... Two Times A Day 7)  Freestyle Test   Strp (Glucose Blood) .... Qid, and Lancets, Variable Glucoses 250.01 8)  Mucinex Dm 30-600 Mg Xr12h-Tab (Dextromethorphan-Guaifenesin) .[name].. 1 By Mouth Qd 9)  Bd U/f Short Pen Needle 31g X 8 Mm Misc (Insulin Pen Needle) .... Qid 10)  Novolog Mix 70/30 Flexpen 70-30 % Susp (Insulin Aspart Prot & Aspart) .... 26 Units Am (With Breakfast) and 12 Units Pm (With Evening Meal), and Pen Needles Bid 11)  Lipitor 40 Mg Tabs (Atorvastatin Calcium) .... Take 1 Tab By Mouth Daily 12)  Triamcinolone Acetonide 0.5 % Oint (Triamcinolone Acetonide) .... Apply At Bedtime and Wear Latex Gloves At Night  Allergies (verified): No Known Drug Allergies  Past History:  Past Medical History: Last  updated: 12/19/2006 Diabetes mellitus, type I Hyperlipidemia Hypertension Myocardial infarction, hx of 07/14/01 Coronary artery disease stroke - 2008  Review of Systems  The patient denies hypoglycemia.    Physical Exam  General:  normal appearance.   Pulses:  dorsalis pedis intact bilat.   Extremities:  no deformity.  no ulcer on the feet.  feet are of normal color and temp.  no edema  Neurologic:  sensation is intact to touch on the feet  Additional Exam:   Hemoglobin A1C       [H]  8.4 %                       4.6-6.5    Impression & Recommendations:  Problem # 1:  DIABETES MELLITUS, TYPE I (ICD-250.01) needs increased rx  HgbA1C: 8.4 (12/01/2009)  Medications Added to Medication List This Visit: 1)  Novolog Mix 70/30 Flexpen 70-30 % Susp (Insulin aspart prot & aspart) .... 26 units am (with breakfast) and 28 units pm (with evening meal), and pen needles bid  Other Orders: Est. Patient Level III ([address])  Patient Instructions: 1)  tests are being ordered for you today.  a few days after the  on this type of insulin, meals should not be missed or delayed. 2)  take novolog 70/30, 26 units am and 18 units with the evening meal. 3)  howver, if you anticipate exertion during the day, take only 20 units that morning.   4)  Please schedule a follow-up appointment in 3 months. Prescriptions: NOVOLOG MIX 70/30 FLEXPEN 70-30 % SUSP (INSULIN ASPART PROT & ASPART) 26 units am (with breakfast) and 28 units pm (with evening meal), and pen needles bid  #2 boxes x 11   Entered and Authorized by:   Minus Breeding MD   Signed by:   Minus Breeding MD on 12/15/2009   Method used:   Electronically to        [facility] [phone]* (retail)       [address]       [place], [place]  [address]       Ph: [phone] or [phone]       Fax: [phone]   RxID:   [id number]    Orders Added: 1)  Est. Patient Level III [66440]

## 2010-03-06 NOTE — Miscellaneous (Signed)
Summary: [facility]   Imported By: Lester Austin 04/28/2009 08:54:08  _____________________________________________________________________  External Attachment:    Type:   Image     Comment:   External Document

## 2010-03-06 NOTE — Assessment & Plan Note (Signed)
Summary: 3 MTH FU----D/T---STC   Vital Signs:  Patient profile:   60 year old male Height:      69 inches (175.26 cm) Weight:      190.50 pounds (86.59 kg) BMI:     28.23 O2 Sat:      95 % on Room air Temp:     97.4 degrees F (36.33 degrees C) oral Pulse rate:   62 / minute Pulse rhythm:   regular BP sitting:   108 / 76  (left arm) Cuff size:   regular  Vitals Entered By: Brenton Grills MA (September 08, 2009 8:33 AM)  O2 Flow:  Room air CC: 3 mo F/U/pt states he is using 18 units of Levemir at night and 6 unitf of Novolog before bed/aj   CC:  3 mo F/U/pt states he is using 18 units of Levemir at night and 6 unitf of Novolog before bed/aj.  History of Present Illness: no cbg record, but states cbg's are sometimes low before lunch, or in the afternoon.  it is highest in am.  he says his hs-snack is small.  he takes 6 units novolog for the hs-snack.  pt states he feels well in general.  Current Medications (verified): 1)  Ramipril 5 Mg  Caps (Ramipril) .... 2 By Mouth Once Daily or As Directed 2)  Lipitor 80 Mg  Tabs (Atorvastatin Calcium) .... Take 1 By Mouth Qhs 3)  Nitroglycerin 0.4 Mg Subl (Nitroglycerin) .... One Tablet Under Tongue Every 5 Minutes As Needed For Chest Pain---May Repeat Times Three 4)  Advil 200 Mg Tabs (Ibuprofen) .... Take 1 Tablet By Mouth Every Eight Hours As Needed 5)  Co Q-10 100 Mg Caps (Coenzyme Q10) .... Take Once A Day 6)  Freestyle Lancets  Misc (Lancets) 7)  Levemir Flexpen 100 Unit/ml Soln (Insulin Detemir) .... Inject 16  Unit Subcutaneously  Every Night 8)  Novolog Flexpen 100 Unit/ml Soln (Insulin Aspart) .... Tid (Qac) 11-16-10 Units 9)  Aspirin Ec 325 Mg  Tbec (Aspirin) .... Two Times A Day 10)  Freestyle Test   Strp (Glucose Blood) .... Qid, and Lancets, Variable Glucoses 250.01 11)  Mucinex Dm 30-600 Mg Xr12h-Tab (Dextromethorphan-Guaifenesin) .[name].. 1 By Mouth Qd 12)  Bd U/f Short Pen Needle 31g X 8 Mm Misc (Insulin Pen Needle) ....  Qid  Allergies (verified): No Known Drug Allergies  Past History:  Past Medical History: Last updated: 12/19/2006 Diabetes mellitus, type I Hyperlipidemia Hypertension Myocardial infarction, hx of 07/14/01 Coronary artery disease stroke - 2008  Social History: Reviewed history from 04/21/2009 and no changes required. 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    Physical Exam  General:  normal appearance.   Neck:  Supple without thyroid enlargement or tenderness.    Impression & Recommendations:  Problem # 1:  DIABETES MELLITUS, TYPE I (ICD-250.01) he may need a simpler regimen  Medications Added to Medication List This Visit: 1)  Levemir Flexpen 100 Unit/ml Soln (Insulin detemir) .... Inject 18 units subcutaneously  every night 2)  Novolog Flexpen 100 Unit/ml Soln (Insulin aspart) .... Tid (qac) 10-16-10 units  Other Orders: TLB-A1C / Hgb A1C (Glycohemoglobin) (83036-A1C) TLB-A1C / Hgb A1C (Glycohemoglobin) (83036-A1C) Est. Patient Level III (29562)  Patient Instructions: 1)  tests are being ordered for you today.  a few days after the test(s), please call [phone] to hear your test results. 2)  pending the test results, please: 3)  increase  levemir to 18 units at night. 4)  decrease novolog to (just before each meal) 10-16-10 units.  if you eat a bedtime snack, continue to take 6 units novolog for it.   5)  check your blood sugar 4 times a day--before the 3 meals, and at bedtime.  also check if you have symptoms of your blood sugar being too high or too low.  please keep a record of the readings and bring it to your next appointment here.  please call us sooner if you are having low blood sugar episodes. 6)  Please schedule a follow-up appointment in 3 months. 7)  (update: i left message on phone-tree:  i offered two times a day premix).

## 2010-03-06 NOTE — Assessment & Plan Note (Signed)
Summary: 4 month rov/njr   Vital Signs:  Patient profile:   60 year old male Height:      69 inches Weight:      184 pounds BMI:     27.27 Temp:     98.1 degrees F oral Pulse rate:   74 / minute BP sitting:   104 / 78  (left arm) Cuff size:   regular  Vitals Entered By: Kern Reap CMA Duncan Dull) (March 24, 2009 7:59 AM)   History of Present Illness:  Follow-Up Visit      This is a 60 year old man who presents for Follow-up visit.  The patient denies chest pain and palpitations.  Since the last visit the patient notes no new problems or concerns.  The patient reports taking meds as prescribed and dietary noncompliance.  When questioned about possible medication side effects, the patient notes none.    All other systems reviewed and were negative   Current Problems (verified): 1)  Cva  (ICD-434.91) 2)  Coronary Artery Disease  (ICD-414.00) 3)  Myocardial Infarction, Hx of  (ICD-412) 4)  Hypertension  (ICD-401.9) 5)  Hyperlipidemia  (ICD-272.4) 6)  Diabetes Mellitus, Type I  (ICD-250.01)  Current Medications (verified): 1)  Ramipril 5 Mg  Caps (Ramipril) .... 2 By Mouth Once Daily or As Directed 2)  Lipitor 80 Mg  Tabs (Atorvastatin Calcium) .... Take 1 By Mouth Qhs 3)  Nitroglycerin 0.4 Mg Subl (Nitroglycerin) .... One Tablet Under Tongue Every 5 Minutes As Needed For Chest Pain---May Repeat Times Three 4)  Advil 200 Mg Tabs (Ibuprofen) .... Take 1 Tablet By Mouth Every Eight Hours As Needed 5)  Co Q-10 100 Mg Caps (Coenzyme Q10) .... Take Once A Day 6)  Freestyle Lancets  Misc (Lancets) 7)  Levemir Flexpen 100 Unit/ml Soln (Insulin Detemir) .... Inject 14  Unit Subcutaneously  Every Night 8)  Novolog Flexpen 100 Unit/ml Soln (Insulin Aspart) .... Tid (Qac) 11-18-10 Units 9)  Aspirin Ec 325 Mg  Tbec (Aspirin) .... Two Times A Day 10)  Freestyle Test   Strp (Glucose Blood) .... Qid, and Lancets, Variable Glucoses 250.01 11)  Mucinex Dm 30-600 Mg Xr12h-Tab  (Dextromethorphan-Guaifenesin) .[name].. 1 By Mouth Qd 12)  Bd U/f Short Pen Needle 31g X 8 Mm Misc (Insulin Pen Needle) .... 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   Head:  normocephalic and atraumatic.   Eyes:  pupils equal and pupils round.   Ears:  R ear normal and L ear normal.   Neck:  supple, full ROM, and no masses.   Chest Wall:  No deformities, masses, tenderness or gynecomastia noted. Lungs:  normal respiratory effort and no accessory muscle use.   Heart:  normal rate and regular rhythm.   Abdomen:  soft, non-tender, normal bowel sounds, no distention, no masses, no guarding, and no rigidity.   Msk:  normal ROM, no joint tenderness, no joint swelling, no joint warmth, and no redness over joints.   Pulses:  R radial normal and L radial normal.  Neurologic:  cranial nerves II-XII intact and gait normal.   Skin:  turgor normal and color normal.   Psych:  normally interactive and good eye contact.    Diabetes Management Exam:    Eye Exam:       Eye Exam done elsewhere          Date: 12/05/2008          Results: normal          Done by: dr scott   Impression & Recommendations:  Problem # 1:  DIABETES MELLITUS, TYPE I (ICD-250.01) reasonable control but could be better advised increase diet control do not want to increase insulin as he does have occasonal hypoglycemia His updated medication list for this  problem includes:    Ramipril 5 Mg Caps (Ramipril) .[name]... 2 by mouth once daily or as directed    Levemir Flexpen 100 Unit/ml Soln (Insulin detemir) ..... Inject 14  unit subcutaneously  every night    Novolog Flexpen 100 Unit/ml Soln (Insulin aspart) .[name]... Tid (qac) 11-18-10 units    Aspirin Ec 325 Mg Tbec (Aspirin) .[name]..[name] Two times a day  Problem # 2:  CORONARY ARTERY DISEASE (ICD-414.00)  His updated medication list for this problem includes:    Ramipril 5 Mg Caps (Ramipril) .[name]... 2 by mouth once daily or as directed    Nitroglycerin 0.4 Mg Subl (Nitroglycerin) ..... One tablet under tongue every 5 minutes as needed for chest pain---may repeat times three    Aspirin Ec 325 Mg Tbec (Aspirin) .[name]..[name] Two times a day  Labs Reviewed: Chol: 138 (03/10/2009)   HDL: 57.00 (03/10/2009)   LDL: 69 (03/10/2009)   TG: 62.0 (03/10/2009)  Problem # 3:  HYPERLIPIDEMIA (ICD-272.4)  His updated medication list for this problem includes:    Lipitor 80 Mg Tabs (Atorvastatin calcium) .[name]... Take 1 by mouth qhs  Labs Reviewed: SGOT: 23 (03/10/2009)   SGPT: 26 (03/10/2009)   HDL:57.00 (03/10/2009), 50.90 (11/18/2008)  LDL:69 (03/10/2009), 109 (16/11/9602)  Chol:138 (03/10/2009), 173 (11/18/2008)  Trig:62.0 (03/10/2009), 67.0 (11/18/2008)  Complete Medication List: 1)  Ramipril 5 Mg Caps (Ramipril) .... 2 by mouth once daily or as directed 2)  Lipitor 80 Mg Tabs (Atorvastatin calcium) .... Take 1 by mouth qhs 3)  Nitroglycerin 0.4 Mg Subl (Nitroglycerin) .... One tablet under tongue every 5 minutes as needed for chest pain---may repeat times three 4)  Advil 200 Mg Tabs (Ibuprofen) .... Take 1 tablet by mouth every eight hours as needed 5)  Co Q-10 100 Mg Caps (Coenzyme q10) .... Take once a day 6)  Freestyle Lancets Misc (Lancets) 7)  Levemir Flexpen 100 Unit/ml Soln (Insulin detemir) .... Inject 14  unit subcutaneously  every night 8)  Novolog Flexpen 100 Unit/ml Soln (Insulin aspart) .... Tid (qac)  11-18-10 units 9)  Aspirin Ec 325 Mg Tbec (Aspirin) .... Two times a day 10)  Freestyle Test Strp (Glucose blood) .... Qid, and lancets, variable glucoses 250.01 11)  Mucinex Dm 30-600 Mg Xr12h-tab (Dextromethorphan-guaifenesin) .[name].. 1 by mouth qd 12)  Bd U/f Short Pen Needle 31g X 8 Mm Misc (Insulin pen needle) .... Qid  Patient Instructions: 1)  Please schedule a follow-up appointment in 4 months. 2)  labs one week prior to visit 3)  lipids---272.4 4)  lfts-995.2 5)  bmet-995.2 6)  A1C-250.02 7)

## 2010-03-06 NOTE — Assessment & Plan Note (Signed)
Summary: 2 MTH FU  STC   Vital Signs:  Patient profile:   60 year old male Height:      69 inches (175.26 cm) Weight:      190.38 pounds (86.54 kg) O2 Sat:      95 % on Room air Temp:     97.1 degrees F (36.17 degrees C) oral Pulse rate:   76 / minute BP sitting:   110 / 80  (left arm) Cuff size:   regular  Vitals Entered By: Josph Macho RMA (Jun 16, 2009 8:15 AM)  O2 Flow:  Room air CC: 2 month follow up/ CF Is Patient Diabetic? Yes   CC:  2 month follow up/ CF.  History of Present Illness: pt states he feels well in general.  no cbg record, but states cbg's are still low in the afternoon.  it is still highest in the morning (higher than at hs).  he again declines pump therapy.  Current Medications (verified): 1)  Ramipril 5 Mg  Caps (Ramipril) .... 2 By Mouth Once Daily or As Directed 2)  Lipitor 80 Mg  Tabs (Atorvastatin Calcium) .... Take 1 By Mouth Qhs 3)  Nitroglycerin 0.4 Mg Subl (Nitroglycerin) .... One Tablet Under Tongue Every 5 Minutes As Needed For Chest Pain---May Repeat Times Three 4)  Advil 200 Mg Tabs (Ibuprofen) .... Take 1 Tablet By Mouth Every Eight Hours As Needed 5)  Co Q-10 100 Mg Caps (Coenzyme Q10) .... Take Once A Day 6)  Freestyle Lancets  Misc (Lancets) 7)  Levemir Flexpen 100 Unit/ml Soln (Insulin Detemir) .... Inject 15  Unit Subcutaneously  Every Night 8)  Novolog Flexpen 100 Unit/ml Soln (Insulin Aspart) .... Tid (Qac) 11-17-10 Units 9)  Aspirin Ec 325 Mg  Tbec (Aspirin) .... Two Times A Day 10)  Freestyle Test   Strp (Glucose Blood) .... Qid, and Lancets, Variable Glucoses 250.01 11)  Mucinex Dm 30-600 Mg Xr12h-Tab (Dextromethorphan-Guaifenesin) .[name].. 1 By Mouth Qd 12)  Bd U/f Short Pen Needle 31g X 8 Mm Misc (Insulin Pen Needle) .... Qid  Allergies (verified): 1)  * No Known Drug Allergies Group  Past History:  Past Medical History: Last updated: 12/19/2006 Diabetes mellitus, type I Hyperlipidemia Hypertension Myocardial  infarction, hx of 07/14/01 Coronary artery disease stroke - 2008  Review of Systems  The patient denies syncope.    Physical Exam  General:  normal appearance.   Skin:  insulin injection sites at anterior abdomen are normal  Additional Exam:  Hemoglobin A1C       [H]  8.1 %   Impression & Recommendations:  Problem # 1:  DIABETES MELLITUS, TYPE I (ICD-250.01) needs increased rx  Medications Added to Medication List This Visit: 1)  Levemir Flexpen 100 Unit/ml Soln (Insulin detemir) .... Inject 16  unit subcutaneously  every night 2)  Novolog Flexpen 100 Unit/ml Soln (Insulin aspart) .... Tid (qac) 11-16-10 units  Other Orders: TLB-A1C / Hgb A1C (Glycohemoglobin) (83036-A1C) Est. Patient Level III ([address]109)  Patient Instructions: 1)  tests are being ordered for you today.  a few days after the test(s), please call [phone] to hear your test results. 2)  pending the test results, please: 3)  increase levemir to 16 units at night. 4)  decrease novolog to (just before each meal) 11-16-10 units 5)  check your blood sugar 4 times a day--before the 3 meals, and at bedtime.  also check if you have symptoms of your blood sugar being too high or too low.  please keep a record of the readings and bring it to your next appointment here.  please call us sooner if you are having low blood sugar episodes. 6)  Please schedule a follow-up appointment in 3 months. 7)  (update: i left message on phone-tree:  rx as we discussed)

## 2010-03-06 NOTE — Progress Notes (Signed)
  Phone Note Call from Patient Call back at [facility] Phone [phone]   Caller: Patient Summary of Call: Pt called stating that he would like to start two times a day premix Insulin as MD suggested on PT. Pt advised MD out of office and will wait for his return. Initial call taken by: Margaret Pyle, CMA,  September 21, 2009 4:14 PM  Follow-up for Phone Call        change to novolog mix pen 26 units am and 12 units pm.  ret < 2 weeks Follow-up by: Minus Breeding MD,  September 25, 2009 12:34 PM  Additional Follow-up for Phone Call Additional follow up Details #1::        Pt informed, will call back to sch ROV Additional Follow-up by: Margaret Pyle, CMA,  September 25, 2009 1:53 PM    New/Updated Medications: NOVOLOG MIX 70/30 FLEXPEN 70-30 % SUSP (INSULIN ASPART PROT & ASPART) 26 units am (with breakfast) and 12 units pm (with evening meal), and pen needles bid Prescriptions: NOVOLOG MIX 70/30 FLEXPEN 70-30 % SUSP (INSULIN ASPART PROT & ASPART) 26 units am (with breakfast) and 12 units pm (with evening meal), and pen needles bid  #1 box x 11   Entered and Authorized by:   Minus Breeding MD   Signed by:   Minus Breeding MD on 09/25/2009   Method used:   Electronically to        [facility] [phone]* (retail)       [address]       [place], [place]  [address]       Ph: [phone] or [phone]       Fax: [phone]   RxID:   [id number]

## 2010-03-08 NOTE — Letter (Signed)
Summary: [facility]   Imported By: Sherian Rein 01/17/2010 10:43:03  _____________________________________________________________________  External Attachment:    Type:   Image     Comment:   External Document

## 2010-03-23 ENCOUNTER — Ambulatory Visit: Payer: Self-pay | Admitting: Endocrinology

## 2010-03-25 ENCOUNTER — Other Ambulatory Visit: Payer: Self-pay | Admitting: Internal Medicine

## 2010-04-06 ENCOUNTER — Encounter: Payer: Self-pay | Admitting: Endocrinology

## 2010-04-06 ENCOUNTER — Ambulatory Visit (INDEPENDENT_AMBULATORY_CARE_PROVIDER_SITE_OTHER): Payer: 59 | Admitting: Endocrinology

## 2010-04-06 ENCOUNTER — Other Ambulatory Visit: Payer: Self-pay

## 2010-04-06 ENCOUNTER — Other Ambulatory Visit (INDEPENDENT_AMBULATORY_CARE_PROVIDER_SITE_OTHER): Payer: Managed Care, Other (non HMO) | Admitting: Internal Medicine

## 2010-04-06 DIAGNOSIS — E119 Type 2 diabetes mellitus without complications: Secondary | ICD-10-CM

## 2010-04-06 DIAGNOSIS — T887XXA Unspecified adverse effect of drug or medicament, initial encounter: Secondary | ICD-10-CM

## 2010-04-06 DIAGNOSIS — E785 Hyperlipidemia, unspecified: Secondary | ICD-10-CM

## 2010-04-06 DIAGNOSIS — I10 Essential (primary) hypertension: Secondary | ICD-10-CM

## 2010-04-06 DIAGNOSIS — E109 Type 1 diabetes mellitus without complications: Secondary | ICD-10-CM

## 2010-04-06 LAB — LIPID PANEL
HDL: 60.5 mg/dL (ref >39.00)
Total CHOL/HDL Ratio: 3
VLDL: 20.4 mg/dL (ref 0.0–40.0)

## 2010-04-06 LAB — BASIC METABOLIC PANEL
Calcium: 9.7 mg/dL (ref 8.4–10.5)
GFR: 54.76 mL/min — ABNORMAL LOW (ref >60.00)
Sodium: 145 mEq/L (ref 135–145)

## 2010-04-06 LAB — HEPATIC FUNCTION PANEL
Albumin: 3.9 g/dL (ref 3.5–5.2)
Alkaline Phosphatase: 64 U/L (ref 39–117)
Bilirubin, Direct: 0.1 mg/dL (ref 0.0–0.3)

## 2010-04-06 LAB — HEMOGLOBIN A1C: Hgb A1c MFr Bld: 9.1 % — ABNORMAL HIGH (ref 4.6–6.5)

## 2010-04-07 ENCOUNTER — Encounter: Payer: Self-pay | Admitting: Internal Medicine

## 2010-04-12 NOTE — Assessment & Plan Note (Signed)
Summary: 3 MTH FU STC   Vital Signs:  Patient profile:   60 year old male Height:      69 inches (175.26 cm) Weight:      184.13 pounds (83.70 kg) BMI:     27.29 O2 Sat:      95 % on Room air Temp:     97.5 degrees F (36.39 degrees C) oral Pulse rate:   60 / minute Pulse rhythm:   regular BP sitting:   118 / 74  (left arm) Cuff size:   regular  Vitals Entered By: Brenton Grills CMA Duncan Dull) (April 06, 2010 8:32 AM)  O2 Flow:  Room air CC: 3 month F/U/aj Is Patient Diabetic? Yes   CC:  3 month F/U/aj.  History of Present Illness: pt states he feels well in general.  he seldom has hypoglycemia, and these episodes are mild.  it usually happens before lunch, or before supper, if meals are smaller than anticipated.  it is highest in am (sometimes over 200).    Current Medications (verified): 1)  Ramipril 5 Mg  Caps (Ramipril) .... 2 By Mouth Once Daily or As Directed 2)  Nitroglycerin 0.4 Mg Subl (Nitroglycerin) .... One Tablet Under Tongue Every 5 Minutes As Needed For Chest Pain---May Repeat Times Three 3)  Advil 200 Mg Tabs (Ibuprofen) .... Take 1 Tablet By Mouth Every Eight Hours As Needed 4)  Co Q-10 100 Mg Caps (Coenzyme Q10) .... Take Once A Day 5)  Freestyle Lancets  Misc (Lancets) 6)  Aspirin Ec 325 Mg  Tbec (Aspirin) .... Two Times A Day 7)  Freestyle Test   Strp (Glucose Blood) .... Qid, and Lancets, Variable Glucoses 250.01 8)  Mucinex Dm 30-600 Mg Xr12h-Tab (Dextromethorphan-Guaifenesin) .[name].. 1 By Mouth Qd 9)  Bd U/f Short Pen Needle 31g X 8 Mm Misc (Insulin Pen Needle) .... Qid 10)  Novolog Mix 70/30 Flexpen 70-30 % Susp (Insulin Aspart Prot & Aspart) .... 26 Units Am (With Breakfast) and 28 Units Pm (With Evening Meal), and Pen Needles Bid 11)  Lipitor 40 Mg Tabs (Atorvastatin Calcium) .... Take 1 Tab By Mouth Daily 12)  Triamcinolone Acetonide 0.5 % Oint (Triamcinolone Acetonide) .... Apply At Bedtime and Wear Latex Gloves At Night  Allergies (verified): No Known  Drug Allergies  Past History:  Past Medical History: Last updated: 12/19/2006 Diabetes mellitus, type I Hyperlipidemia Hypertension Myocardial infarction, hx of 07/14/01 Coronary artery disease stroke - 2008  Review of Systems  The patient denies syncope.    Physical Exam  General:  normal appearance.   Skin:  injection sites at the anterior adomen are without lesions, except for a few ecchymoses.     Impression & Recommendations:  Problem # 1:  DIABETES MELLITUS, TYPE I (ICD-250.01) uncertain control  Other Orders: TLB-A1C / Hgb A1C (Glycohemoglobin) (83036-A1C) Est. Patient Level III (16[address])  Patient Instructions: 1)  blood tests are being ordered for you today.  please call [phone] to hear your test results. 2)  pending the test results, please continue the same insulin for now.   3)  Please schedule a follow-up appointment in 3 months.   Orders Added: 1)  TLB-A1C / Hgb A1C (Glycohemoglobin) [83036-A1C] 2)  Est. Patient Level III [81191]   Immunization History:  Influenza Immunization History:    Influenza:  historical (11/04/2009)   Immunization History:  Influenza Immunization History:    Influenza:  Historical (11/04/2009)

## 2010-04-20 ENCOUNTER — Ambulatory Visit: Payer: Self-pay | Admitting: Internal Medicine

## 2010-05-04 ENCOUNTER — Ambulatory Visit: Payer: Self-pay | Admitting: Internal Medicine

## 2010-05-04 ENCOUNTER — Ambulatory Visit (INDEPENDENT_AMBULATORY_CARE_PROVIDER_SITE_OTHER): Payer: Managed Care, Other (non HMO) | Admitting: Internal Medicine

## 2010-05-04 ENCOUNTER — Encounter: Payer: Self-pay | Admitting: Internal Medicine

## 2010-05-04 ENCOUNTER — Other Ambulatory Visit: Payer: Self-pay | Admitting: Endocrinology

## 2010-05-04 VITALS — BP 112/80 | HR 88 | Temp 97.8°F | Ht 68.5 in | Wt 185.0 lb

## 2010-05-04 DIAGNOSIS — I6350 Cerebral infarction due to unspecified occlusion or stenosis of unspecified cerebral artery: Secondary | ICD-10-CM

## 2010-05-04 DIAGNOSIS — E109 Type 1 diabetes mellitus without complications: Secondary | ICD-10-CM

## 2010-05-04 DIAGNOSIS — E785 Hyperlipidemia, unspecified: Secondary | ICD-10-CM

## 2010-05-04 DIAGNOSIS — I2510 Atherosclerotic heart disease of native coronary artery without angina pectoris: Secondary | ICD-10-CM

## 2010-05-04 MED ORDER — GLUCOSE BLOOD VI STRP: ORAL_STRIP | Status: DC

## 2010-05-04 MED ORDER — INSULIN DETEMIR 100 UNIT/ML ~~LOC~~ SOLN: 20.0000 [IU] | Freq: Every day | SUBCUTANEOUS | Status: DC

## 2010-05-04 MED ORDER — INSULIN LISPRO 100 UNIT/ML ~~LOC~~ SOLN: SUBCUTANEOUS | Status: DC

## 2010-05-04 NOTE — Assessment & Plan Note (Signed)
Fair control Note HDL

## 2010-05-04 NOTE — Assessment & Plan Note (Signed)
Poor control on 7030 insulin. Needs aggressive care. Discussed options. He would like to go back to a basal insulin with meal time short acting insulin. I have reviewed medications with him. We'll start Levemir 20 units with a sliding scale of Humalog. Patient is intelligent and understands the instructions. He also started exercising. He has noted in the past with stress exercise they will have hypoglycemia. He will call me with any symptoms.

## 2010-05-04 NOTE — Progress Notes (Signed)
  Subjective:    Patient ID: John Carrillo, male    DOB: Jul 13, 1950, 60 y.o.   MRN: [id number]  HPI   patient comes in for followup of multiple medical problems including type 2 diabetes, hyperlipidemia, hypertension. The patient does not check blood sugar or blood pressure at home. The patetient does not follow an exercise or diet program. The patient denies any polyuria, polydipsia.  In the past the patient has gone to diabetic treatment center. The patient is tolerating medications  Without difficulty. The patient does admit to medication compliance.    Past Medical History  Diagnosis Date  . CORONARY ARTERY DISEASE 08/11/2006  . CVA 12/19/2006  . DIABETES MELLITUS, TYPE I 08/11/2006  . ECZEMA, HANDS 12/15/2009  . HYPERLIPIDEMIA 08/11/2006  . HYPERTENSION 08/11/2006  . MYOCARDIAL INFARCTION, HX OF 08/11/2006   Past Surgical History  Procedure Date  . Tonsilectomy, adenoidectomy, bilateral myringotomy and tubes   . Coronary stent placement     reports that he quit smoking about 10 years ago. He does not have any smokeless tobacco history on file. He reports that he drinks alcohol. His drug history not on file. family history is negative for Diabetes. No Known Allergies   Review of Systems     patient denies chest pain, shortness of breath, orthopnea. Denies lower extremity edema, abdominal pain, change in appetite, change in bowel movements. Patient denies rashes, musculoskeletal complaints. No other specific complaints in a complete review of systems.   Objective:   Physical Exam   well-developed well-nourished male in no acute distress. HEENT exam atraumatic, normocephalic, neck supple without jugular venous distention. Chest clear to auscultation cardiac exam S1-S2 are regular. Abdominal exam overweight with bowel sounds, soft and nontender. Extremities no edema. Neurologic exam is alert with a normal gait.        Assessment & Plan:

## 2010-05-04 NOTE — Assessment & Plan Note (Signed)
Has no symptoms. We'll continue current medications. Needs aggressive risk factor modification especially in relation to diabetes.

## 2010-05-04 NOTE — Assessment & Plan Note (Signed)
No symptoms. No recurrent symptoms. Aggressive risk factor modification.

## 2010-06-19 NOTE — Letter (Signed)
Jun 16, 2006    John Pick. [name], MD, [facility]  [address], [address]  [place], [place] [address]   RE:  John Carrillo  MRN:  [id number]  /  DOB:  1950-12-29   Dear John Carrillo:   It was a pleasure to see John Carrillo at your request.  As you recall  he is a 60 year old gentleman who had an MI in 2003 that [place].  He was given lytic therapy which failed, and  he was then airvac'd to [place] at [facility] where he was stented.  He has had no  problems with recurrent chest pain.  He does have some modest residual  disease which is by catheterization done two weeks ago.  Ejection  fraction at catheterization was 30-35%.   MRI was also undertaken which demonstrated ejection fraction of 37%.  He  was referred for consideration of ICD implantation.   He has no orthopnea, peripheral edema or nocturnal dyspnea.  He does  have mild restriction in his stamina, but is able to hike and  competitively sail and do most everything he wants.   He has had no syncope.   He does have a history of palpitations which are aggravated by caffeine.  He describes these as irregular.   In the event that they are thromboembolic, risk factors are notable for  the recent diagnosis of diabetes.  They are, otherwise, negative for  hypertension or prior stroke.  His LV dysfunction is concerning in this  regard.   PAST MEDICAL HISTORY:  In addition to the above is largely negative.   REVIEW OF SYSTEMS:  Noncontributory for multiple organ systems.   PRIOR SURGICAL HISTORY:  Negative apart from T&A.   SOCIAL HISTORY:  Does not use cigarettes or recreational drugs.  He does  drink alcohol occasionally.  He is married.  He has four children  ranging in age from 68 to 31.  He works at [employer] with  [employer].   CURRENT MEDICATIONS:  1. Aspirin 325.  2. Altace 2.5.  3. NovoLog.  4. __________  .  5. Lipitor.  6. He is currently not on a beta blocker.   ALLERGIES:  He has no  known drug allergies.   PHYSICAL EXAMINATION:  GENERAL:  He is a middle-aged Caucasian male  appearing his stated age of 37.  VITAL SIGNS:  Blood pressure 112/76, pulse 79, weight 184.  HEENT:  Demonstrated no __________  .  NECK:  Neck veins flat, carotids brisk and full bilaterally without  bruits.  BACK:  Without kyphosis, scoliosis.  LUNGS:  Clear.  HEART:  Heart sounds regular without murmurs or gallops.  ABDOMEN:  Soft with active bowel sounds without midline pulsation or  hepatomegaly.  Femoral pulses 2+, distal pulses were intact.  EXTREMITIES:  There is no clubbing, cyanosis or edema.  NEUROLOGICAL:  Grossly normal.  SKIN:  Warm and dry.   His electrocardiogram demonstrated sinus rhythm at 79 with intervals of  0.2/0.16/0.44.  The axis was leftward at -40.   IMPRESSION:  1. Ischemic cardiomyopathy      a.     Prior myocardial infarction.      b.     Ejection fraction 30-35% by catheterization, 37% by MR.  2. Left bundle branch block with left axis deviation.  3. Class 2a congestive heart failure.  4. Irregular tachycardic palpitations with question atrial      fibrillation with thromboembolic risk factors including:  a.     Hypertension.      b.     Left ventricle dysfunction.   John Carrillo,  John Carrillo has ischemic heart disease with depressed left  ventricle function.  Based on current criteria he would be a candidate  for ICD implantation.  We reviewed this extensively including the  potential complications including but not limited to death, perforation,  infection, lead dislodgement, device malfunction.  He understands these  risks.   We also discussed enrollment in the DETERMINE registry, and he will  consider that if he proceeds with ICD implantation.   I note that he is not on a beta blocker, and I will plan to review this  with you.  It may have been that his previous bradycardia precluded  this.   Further I am concerned about his irregular palpitations,  and I wonder  whether this does not represent atrial fibrillation.  In the event that  he comes to ICD implantation, I would anticipate a dual chamber device  which would allow [place] to monitor for atrial fibrillation as well as to  treat with beta blockers and avoid the concerns for underlying  bradycardia.   Thank you for the consultation.    Sincerely,      John Salvia, MD, [facility]  Electronically Signed    SCK/MedQ  DD: 06/16/2006  DT: 06/16/2006  Job #: [id number]   CC:   John Mole. Swords, MD  John Dunker. Everardo All, MD

## 2010-06-19 NOTE — Cardiovascular Report (Signed)
John Carrillo, MASSING              ACCOUNT NO.:  [id number]   MEDICAL RECORD NO.:  [id number]          PATIENT TYPE:  OIB   LOCATION:  1962                         FACILITY:  [facility]   PHYSICIAN:  Peter C. [name], MD, FACCDATE OF BIRTH:  1950-10-17   DATE OF PROCEDURE:  DATE OF DISCHARGE:  06/02/2006                            CARDIAC CATHETERIZATION   __________   INDICATIONS:  The patient is a 60 year old gentleman with previous large  anterior wall MI,  late lytic therapy and rescue angioplasty at [facility] in 2003.  Had abnormal Myoview with large anterior wall  scar and inferior wall ischemia mid apical level.   FINDINGS:  Cine catheterization done with 4-French catheter from right  femoral artery.   Left main coronary was normal.   The stent in the proximal LAD was widely patent.  The first diagonal  branch had 20-30 multiple discrete lesions.  There three small diagonal  branches following which were normal.  The mid and distal LAD had 20-30%  multiple discrete lesions.   Circumflex coronary artery had an AV groove branch and large OM.  It was  normal.   Right coronary artery had 30% tubular disease proximally.  The distal  RCA appeared to have a 50% to at most 60% lesion.  There appeared to be  an aneurysmal sac segment adjacent to this.  The distal RCA had 20-30%  multiple discrete lesions.   RAO ventriculography showed akinesis of the mid anterior wall and  dyskinesis of the distal anterior wall apex and inferior apex.  There  was no mural thrombus.  Overall LV ejection fraction was in the 30-35%  range.  There was no MR.  No gradient across the aortic valve.  Left  ventricular pressure was 115/11.  Aortic pressure is 115/71.   IMPRESSION:  I will review the films Dr. Juanda Chance.  However, I do not  think that the RCA need intervention.   Of more concern is the fact that the patient has appears to have had  some remodeling and a decrease in his ejection  fraction.  He will have a  cardiac MRI to quantitate his ejection fraction and assess scar.  I  suspect he will need an AICD and we will tentatively make plans for him  to see our EP doctors in the office as soon as possible and talk to him  about implantation of a defibrillator.      Noralyn Pick. [name], MD, [facility]  Electronically Signed     PCN/MEDQ  D:  06/02/2006  T:  06/02/2006  Job:  [id number]

## 2010-06-19 NOTE — Discharge Summary (Signed)
NAMECEDARIUS, John Carrillo              ACCOUNT NO.:  [id number]   MEDICAL RECORD NO.:  [id number]         PATIENT TYPE:  LINP   LOCATION:                               FACILITY[facility]  [facility]   PHYSICIAN:  Rosalyn Gess. Norins, MD  DATE OF BIRTH:  1950/08/04   DATE OF ADMISSION:  12/14/2006  DATE OF DISCHARGE:  12/15/2006                               DISCHARGE SUMMARY   ADMITTING DIAGNOSIS:  History of possible cerebrovascular accident with  speech changes and visual changes.   CONSULTANTS:  None.   PROCEDURES:  CT of the brain performed November 8th with patchy  subcortical white matter disease in the left parietal lobe suggestive of  subacute stroke.  No evidence of acute hemorrhage or midline shift.  1. MRI of the brain with and without contrast, which revealed a      cluster of acute to subacute strokes in the left parietal cortical,      subcortical region, consistent with branch vessel territory in the      left MCA.  No sign of hemorrhage, swelling or mass effect.  The      area of involvement of this clusters stroke was 4 cm in diameter,      consistent with middle cerebral artery territory infarction.   HISTORY OF PRESENT ILLNESS:  The patient has been traveling back from  [facility].  He experienced 24 hours of confusion with slurred speech,  headache, dizziness, and did report visual changes with constriction or  loss of peripheral vision.  By the time of this presentation to the  emergency department, he was generally improved with resolution of most  of his symptoms.  He had no fever or chills.  He denied any trauma.  He  had no alcohol or substance use.  CT the brain in the ER as noted.  He  was admitted for further evaluation and rule-out.   Please see the past medical history, family history, and social history  contained in the admission note.  Please see medication list at that  time including:  1. Aspirin 325 daily.  2. Altace 2.5 mg daily.  3. Lipitor 80 mg daily.  4.  NovoLog 6 units before breakfast, 10 units before lunch, 6 units      before supper.  5. Levemir 8 units nightly.   HOSPITAL COURSE:  1. The patient was admitted to a telemetry unit.  Cardiac enzymes were      negative with troponin of 0.02 and CK-MB of 2.2.  Telemetry      tracings were normal and telemetry was discontinued.  The patient's      symptoms had resolved.  He did have followup MRI scan with findings      as noted.  A carotid Doppler is ordered and pending at time of      discharge dictation.  With the patient's symptoms having resolved,      it is felt reasonable to discharge the patient home.  He will be      sent home on Aggrenox instead of aspirin, and continue his other  medications.  Based on the findings of his carotid Doppler, he may      need further evaluation, and this will be coronary by Dr. Birdie Sons, the patient's primary physician.  2. Diabetes.  The patient had a hemoglobin A1c of 7.7%, indicating      suboptimal control.  We will leave adjustment of his home regimen      to Dr. Cato Mulligan.  3. Hypertension.  The patient's blood pressure is very stable on his      present medical regimen, and he will continue the same.   DISCHARGE EXAMINATION:  Temperature of 98.2, blood pressure 127/76,  pulse 62, respirations 18, O2 sats 97% on room air.  GENERAL APPEARANCE:  Well-nourished, well-developed, Caucasian gentleman  in no acute distress.  HEENT: Exam was unremarkable.  CHEST:  Clear with no rales, wheezes or rhonchi.  CARDIOVASCULAR:  2+ radial pulse.  He had no JVD.  There were no carotid  bruits.  His precordium was quiet.  He had a regular rate and rhythm  without murmurs, rubs or gallops.  NEUROLOGIC EXAM:  The patient is awake, alert, oriented to person,  place, time and context.  Speech is clear.  Cognition is normal.  Cranial nerves II-XII are grossly intact with normal facial symmetry and  movement.  Extraocular muscles were intact.  Pupils  were equal, round,  reactive.  The patient had normal shoulder shrug.  He had no deviation  of the tongue or uvula.  Motor strength was 5/5 and equal throughout.  DTRs were 2+ and symmetrical throughout.  Cerebellar function:  The  patient had no resting tremor.  No cogwheeling.  He had normal gait and  station.  He had a negative Romberg.  The patient had normal gait.  He  was noted to have slight difficulty with tandem gait, indicated mild  ataxia.   FINAL LABORATORY:  CK-MB was 2.01 on November 9th with a relative index  of 1.  Hemoglobin A1c 7.7%.  Homocystine 6.9.  Lipid panel with a  cholesterol of 203, triglycerides of 81, HDL was 54, LDL cholesterol was  133.   DISPOSITION:  The patient is discharged home.  He will follow up with  Dr. Birdie Sons.  He will continue on his present medical regimen,  although I suspect he may need modification of his anti-lipemic  medications to attain a goal of less 100 for sure, and possibly less  than 80.  He will be started on Aggrenox.  He will continue on Altace  2.5 mg daily.  He will continue on his present insulin regimen of  Levemir 8 units at bedtime with NovoLog at 6, 10 and 6.   The patient will see Dr. Birdie Sons in followup in 5-10 days.   CONDITION ON DISCHARGE:  At time of discharge dictation is stable and  improved.      Rosalyn Gess Norins, MD  Electronically Signed     MEN/MEDQ  D:  12/15/2006  T:  12/15/2006  Job:  [id number]   cc:   Valetta Mole. Swords, MD  [address] [place]  [place] [address]

## 2010-06-19 NOTE — H&P (Signed)
John Carrillo, [name] NO.:  [id number]   MEDICAL RECORD NO.:  [id number]          PATIENT TYPE:  EMS   LOCATION:  MAJO                         FACILITY:  [facility]   PHYSICIAN:  Corwin Levins, MD      DATE OF BIRTH:  10-Sep-1950   DATE OF ADMISSION:  12/13/2006  DATE OF DISCHARGE:                              HISTORY & PHYSICAL   CHIEF COMPLAINT:  24 hours confused and then slurred speech with  headache and dizziness.   HISTORY OF PRESENT ILLNESS:  [name] is a 60 year old white male here  with the above.  He is already much improved overall, per the wife.  There is no fever, trauma, alcohol use, chest pain, shortness of breath,  or other symptoms.  Head CT is negative in the ER.  He is now for  admission for probable TIA and CVA.  Of note is that he and the wife has  spent the last several days in [facility], and has flown back and  basically came straight to the ER.   PAST MEDICAL HISTORY:  ILLNESSES:  1. History of coronary artery disease, status post MI 2003 with known      ejection fraction of 30 to 35%, but with ischemic cardiomyopathy.  2. Hypertension.  3. Hyperlipidemia.  4. Diabetes mellitus.  5. Status post coronary stent.  6. Status post T&A.   ALLERGIES:  No known drug allergies.   CURRENT MEDICATIONS:  1. Aspirin 325 mg 1 p.o. daily.  2. Altace 2.5 mg 1 p.o. daily.  3. Nitroglycerin sublingual p.r.n.  4. Lipitor 80 mg 1 p.o. daily.  5. NovoLog sliding scale insulin, usually on the lines of 6, 10 and 6.  6. Levemir 8 units subcu q.h.s.   SOCIAL HISTORY:  No tobacco, no alcohol.  Currently married.  Lives in  [place].   FAMILY HISTORY:  Otherwise noncontributory.   REVIEW OF SYSTEMS:  Otherwise noncontributory.   PHYSICAL EXAMINATION:  VITAL SIGNS:  He is afebrile.  Heart rate 85,  blood pressure 146/96, O2 saturation 97%, respirations 18.  EENT:  Sclerae are clear.  TMs are clear.  Pharynx within normal limits.  NECK:  Without  lymphadenopathy, JVD, thyromegaly.  CHEST:  No rales or wheezing.  CARDIAC:  Regular rate and rhythm.  ABDOMEN:  Soft, nontender, positive bowel sounds.  EXTREMITIES:  No edema.  NEUROLOGIC:  Mild aphasia persists.  Otherwise, nonfocal.   LABS:  Head CT negative.  CPK-MB negative x1.  CMET with glucose 46.  Otherwise within normal limits.  White blood cell count 9.1, hemoglobin  13.8.   ASSESSMENT AND PLAN:  1. Neurological:  Probable transient ischemic attack/cerebrovascular      accident.  He is to be admitted. Check head MRI as well as carotid      Doppler and 2-D echocardiogram.  Apply telemetry, rule out      arrhythmia.  Add Aggrenox b.i.d. and assess for risk factor as      above.  2. Diabetes mellitus with hyperlipidemia:  Will check lipids and A1c.  3. Hypertension:  Otherwise stable.  Continue  medications.  4. Cardiovascular:  Otherwise stable.  Continue medications.  5. Prophylaxis:  Give Lovenox subcutaneous.  6. Code status:  Full code.      Corwin Levins, MD  Electronically Signed     JWJ/MEDQ  D:  12/14/2006  T:  12/14/2006  Job:  [id number]   cc:   John [name] John Mulligan, MD  Duke Salvia, MD, [facility]  John Carrillo All, MD

## 2010-06-22 NOTE — Cardiovascular Report (Signed)
[place]  Patient:    John Carrillo, John A. Visit Number: [id number] MRN: [id number]          Service Type: CAT Location: [facility] 2856 01 Attending Physician:  Colon Branch Dictated by:   Noralyn Pick [name], M.D. [facility] Proc. Date: 08/26/01 Admit Date:  08/26/2001 Discharge Date: 08/26/2001                          Cardiac Catheterization  PROCEDURE PERFORMED: Coronary arteriography.  INDICATIONS: Recent anterior MI, treated with lytics and rescue angioplasty at [facility]. Early followup stress Cardiolite study showing significant anterior apical wall defect with peri-infarct ischemia. This study is done to rule out early re-stenosis  Standard catheterization was done from the right femoral artery.  Standard Judkins catheters were used. A Noto catheter was used to engage the right coronary artery without damping.  CORONARY ARTERIOGRAPHY: Left main coronary artery had 20% discrete stenosis.  Left anterior descending artery was normal. The proximal stent was widely patent with normal TIMI flow. The first diagonal branch had 20-30% multiple discrete lesions.  The circumflex coronary artery was nondominant and it was normal.  The right coronary artery was dominant. It had aneurysmal segments in the proximal and distal portion. There were 30-40% multiple discrete lesions in the proximal mid and distal portion. The PDA and posterolateral branches were normal sized diseased.  RIGHT ANTERIOR OBLIQUE VENTRICULOGRAPHY: The RAO ventriculography revealed significant hypokinesis of the anterior wall and apex. The ejection fraction was in the 40% range. There was no mural thrombus. There was no gradient across the aortic valve and no MR.  Aortic pressure was 99/60, LV pressure was 99/5.  IMPRESSION: Unfortunately, the patient has had a significant anterior wall myocardial infarction, most likely due to failed lytics and late  rescue angioplasty. However, there is no evidence of myocardial in jeopardy in regards to re-stenosis. The patient will finish his cardiac rehabilitation. He needs aggressive cholesterol modification given the aneurysmal disease in his right coronary artery. Hopefully, he will continue to have some improved LV function. We will probably check a resting MUGA scan in 6-12 months to further assess his LV function. He is on an ACE inhibitor and his blood pressure is well-controlled. He will be discharged later today so long as his groin heals well. Dictated by:   Noralyn Pick [name], M.D. [facility] Attending Physician:  Colon Branch DD:  08/26/01 TD:  08/30/01 Job: [id number]

## 2010-06-22 NOTE — Consult Note (Signed)
[facility]                            ENDOCRINOLOGY CONSULTATION   John Carrillo, John Carrillo                     MRN:          [id number]  DATE:09/06/2005                            DOB:          06-17-1950    REFERRING PHYSICIAN:  Valetta Mole. Swords, MD   REASON FOR REFERRAL:  Diabetes.   HISTORY OF PRESENT ILLNESS:  This is a 60 year old man with an 3-month  history of diabetes. His only known complication is coronary artery disease.  He currently takes three oral agents and his most recent hemoglobin A1C was  good several months ago, but he states his glucose's since then have  increased to the high 100's. He describes his diet as excellent and his  exercise regimen is fair.  Symptomatically, he has 1 year of moderate hair  loss throughout the head, but no associated numbness of the feet.   PAST MEDICAL HISTORY:  Dyslipidemia.   MEDICATIONS:  Januvia, Starlix, Metformin, Lipitor, Altace, and aspirin.   SOCIAL HISTORY:  He is married. 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   FAMILY HISTORY:  Negative for diabetes.   REVIEW OF SYSTEMS:  He has lost 60 pounds in the past year. He denies  hypoglycemia.   PHYSICAL EXAMINATION:  VITAL SIGNS:  Blood pressure is 114/75, heart rate  74, temperature is 97.4, the weight is 154.  GENERAL:  No distress. He is of lean body habitus.  SKIN:  Not diaphoretic.  I do not see a rash.  HEENT:  Normal hair distribution. Pharynx - mucous membranes are moist.  NECK:  Thyroid normal.  CHEST:  Clear to auscultation, no respiratory distress.  CARDIOVASCULAR:  No JVD, no edema. Regular rate and rhythm, no murmur. Pedal  pulses are intact and there is no bruit at the carotid arteries.  FEET:  Normal color and temperature. Dorsalis pedis pulses are intact  bilaterally.  NEUROLOGIC:  Alert and oriented, does not appear anxious nor depressed and  sensation is intact to touch on the feet.   LABORATORY STUDIES:  Hemoglobin A1C 6.4 on May 31 of 2007. Urine micro  albumin normal in January, 2007.   IMPRESSION:  #1. He has several factors favoring a diagnosis of adult onset  type 1 diabetes. These include his lean body habitus, failure of oral  agents, lack of family history, and relatively abrupt onset.  #2. Hair loss of uncertain etiology.   PLAN:  1.  Refer to Georgiana Spinner, diabetes educator to learn insulin injections.      Because he may be very insulin sensitive, I have advised Lantus 3 units      daily, and NovoLog 1 unit t.i.d. (q.a.c.).  2.  Return in about 3 weeks with a record of home glucose's.  3.  I see him as being a pump candidate soon, so he is going to come in for      fasting, C-peptide and anti-gad antibodies.  4.  Check TSH.  Sean A. Everardo All, MD   SAE/MedQ  DD:  09/08/2005  DT:  09/08/2005  Job #:  [id number]   cc:   Alvira Monday, C.D.E.

## 2010-06-22 NOTE — Assessment & Plan Note (Signed)
[facility]                            CARDIOLOGY OFFICE NOTE   John Carrillo                     MRN:          [id number]  DATE:05/14/2006                            DOB:          06-08-1950    John Carrillo returns today for followup.   He is status post a distant anterior wall MI in June 2003.  He was  treated at [facility].  He got thrombolytics and late rescue  angioplasty.  He has a significant anterior apical defect with an EF of  45 percent.   He has been having occasional twinges of chest pain, although they do  not sound quite anginal to me.  He had a perfusion study done May 12, 2006, which showed an anterior apical wall infarct as well as an  inferior infarct with periinfarct ischemia involving the inferior wall  at mid and apical levels.  I brought John Carrillo in and showed him the  pictures and explained to him that I was a little bit concerned about  the inferior changes and his EF had also dropped to 36 percent.   Given his history of ischemic cardiomyopathy, I think that further  investigation is warranted.   REVIEW OF SYSTEMS:  No significant PND, orthopnea.  No palpitations or  syncope.   Ten point review of systems is otherwise negative.   ALLERGIES:  No known allergies.   MEDICATIONS:  1. An aspirin a day.  2. Coenzyme-Q  3. Altace 2.5 a day.  4. Levemir.  5. Lipitor   The patient is currently not on Plavix.   His EKG has shown anterior wall infarct with left axis deviation and he  developed a left bundle branch block at the time of the stress test.   PHYSICAL EXAMINATION:  VITAL SIGNS:  Blood pressure of 120/70.  Pulse  70, regular.  HEENT:  Normal.  NECK:  Carotids normal without bruit.  LUNGS:  Clear.  CARDIAC:  S1, S2, without murmur, rub, gallop, or click.  ABDOMEN:  Non __________  EXTREMITIES:  No edema.  NEUROLOGIC:  Nonfocal.  SKIN:  Warm and dry.   IMPRESSION:  History of anterior  wall myocardial infarction.  Decrease  in left ventricular function.  Question new inferior defect with  reversibility.  The patient will continue his aspirin, beta blocker, and  ACE inhibitor.   We will make arrangements to do a heart catheterization to define his  anatomy.  He did not have significant disease in the right or circumflex  at the time of his anterior myocardial infarction in 2003.  I think that  depending on what we need to do with his coronaries, the patient would  be a good candidate for a cardiac MRI to assess viability and scar  tissue, and possibly be enrolled in the Determine trial.  His ejection  fraction is fairly low and he is getting close to meeting criteria for  defibrillator.  He will probably be evaluated by EP after his heart  catheterization.   His twinges of pain do not sound unstable and I think that we  can do his  heart catheterization as an outpatient.   Further recommendations will be based on this.     Noralyn Pick. [name], MD, [facility]  Electronically Signed    PCN/MedQ  DD: 05/14/2006  DT: 05/15/2006  Job #: [id number]

## 2010-07-13 ENCOUNTER — Ambulatory Visit: Payer: Managed Care, Other (non HMO) | Admitting: Endocrinology

## 2010-07-27 ENCOUNTER — Ambulatory Visit: Payer: Managed Care, Other (non HMO) | Admitting: Endocrinology

## 2010-08-14 ENCOUNTER — Encounter: Payer: Self-pay | Admitting: *Deleted

## 2010-08-17 ENCOUNTER — Other Ambulatory Visit (INDEPENDENT_AMBULATORY_CARE_PROVIDER_SITE_OTHER): Payer: 59

## 2010-08-17 DIAGNOSIS — E109 Type 1 diabetes mellitus without complications: Secondary | ICD-10-CM

## 2010-08-17 LAB — BASIC METABOLIC PANEL
BUN: 21 mg/dL (ref 6–23)
CO2: 30 mEq/L (ref 19–32)
Chloride: 109 mEq/L (ref 96–112)
Glucose, Bld: 213 mg/dL — ABNORMAL HIGH (ref 70–99)
Potassium: 5.6 mEq/L — ABNORMAL HIGH (ref 3.5–5.1)

## 2010-08-17 LAB — LIPID PANEL
HDL: 68 mg/dL (ref >39.00)
LDL Cholesterol: 107 mg/dL — ABNORMAL HIGH (ref 0–99)
Total CHOL/HDL Ratio: 3
Triglycerides: 90 mg/dL (ref 0.0–149.0)
VLDL: 18 mg/dL (ref 0.0–40.0)

## 2010-08-17 LAB — HEPATIC FUNCTION PANEL
Albumin: 4.1 g/dL (ref 3.5–5.2)
Total Bilirubin: 0.7 mg/dL (ref 0.3–1.2)

## 2010-08-24 ENCOUNTER — Ambulatory Visit: Payer: Managed Care, Other (non HMO) | Admitting: Internal Medicine

## 2010-08-24 ENCOUNTER — Encounter: Payer: Self-pay | Admitting: Endocrinology

## 2010-08-24 ENCOUNTER — Ambulatory Visit (INDEPENDENT_AMBULATORY_CARE_PROVIDER_SITE_OTHER): Payer: 59 | Admitting: Endocrinology

## 2010-08-24 VITALS — BP 118/78 | HR 74 | Temp 98.3°F | Ht 69.0 in | Wt 185.8 lb

## 2010-08-24 DIAGNOSIS — E109 Type 1 diabetes mellitus without complications: Secondary | ICD-10-CM

## 2010-08-24 NOTE — Patient Instructions (Addendum)
good diet and exercise habits significanly improve the control of your diabetes.  please let me know if you wish to be referred to a dietician.  high blood sugar is very risky to your health.  you should see an eye doctor every year. controlling your blood pressure and cholesterol drastically reduces the damage diabetes does to your body.  this also applies to quitting smoking.  please discuss these with your doctor.  you should take an aspirin every day, unless you have been advised by a doctor not to. check your blood sugar 4 times a day--before the 3 meals, and at bedtime.  also check if you have symptoms of your blood sugar being too high or too low.  please keep a record of the readings and bring it to your next appointment here.  please call us sooner if you are having low blood sugar episodes. Please make a follow-up appointment in 3 months. Continue levemir 20 units at bedtime Take humalog 3x a day (just before each meal) 06-13-13 units.

## 2010-08-24 NOTE — Progress Notes (Signed)
Subjective:    Patient ID: John Carrillo, male    DOB: 1950-05-25, 60 y.o.   MRN: [id number]  HPI no cbg record, but states cbg's are sometimes as low as 50, before lunch.  He takes a varying dosage of humalog, based on the size of the meal.  He says he usually takes humalog tid (qac) approx 07-14-08 units.  He says cbg's are highest at hs, and in am.  pt states he feels well in general. Past Medical History  Diagnosis Date  . CORONARY ARTERY DISEASE 08/11/2006  . CVA 12/19/2006    2008  . DIABETES MELLITUS, TYPE I 08/11/2006  . ECZEMA, HANDS 12/15/2009  . HYPERLIPIDEMIA 08/11/2006  . HYPERTENSION 08/11/2006  . MYOCARDIAL INFARCTION, HX OF 08/11/2006    07/14/01    Past Surgical History  Procedure Date  . Tonsilectomy, adenoidectomy, bilateral myringotomy and tubes   . Coronary stent placement     History   Social History  . Marital Status: Married    Spouse Name: N/A    Number of Children: N/A  . Years of Education: N/A   Occupational History  . Writer   Social History Main Topics  . Smoking status: Former Smoker    Quit date: 05/03/2000  . Smokeless tobacco: Not on file  . Alcohol Use: Yes     1-2/week  . Drug Use: Not on file  . Sexually Active: Not on file   Other Topics Concern  . Not on file   Social History Narrative   Regular exercise-no    Current Outpatient Prescriptions on File Prior to Visit  Medication Sig Dispense Refill  . aspirin 325 MG tablet Take 325 mg by mouth daily.        [name] atorvastatin (LIPITOR) 40 MG tablet Take 40 mg by mouth daily.        [name] dextromethorphan-guaiFENesin (MUCINEX DM) 30-600 MG per 12 hr tablet Take 1 tablet by mouth every 12 (twelve) hours.        [name] glucose blood (FREESTYLE TEST STRIPS) test strip Use as instructed  300 each  11  . ibuprofen (ADVIL,MOTRIN) 200 MG tablet Take 200 mg by mouth every 6 (six) hours as needed.        . insulin detemir (LEVEMIR FLEXPEN) 100 UNIT/ML injection Inject 20  Units into the skin at bedtime.  10 mL  12  . insulin lispro (HUMALOG KWIKPEN) 100 UNIT/ML injection 6 units with each meal or as directed  10 mL  12  . Insulin Pen Needle (B-D ULTRAFINE III SHORT PEN) 31G X 8 MM MISC by Does not apply route 4 (four) times daily.        . Lancets (FREESTYLE) lancets 1 each by Other route 4 (four) times daily. Use as instructed       . nitroGLYCERIN (NITROSTAT) 0.4 MG SL tablet Place 0.4 mg under the tongue every 5 (five) minutes as needed.        . ramipril (ALTACE) 5 MG capsule take 2 capsules by mouth once daily or as directed  60 capsule  2  . triamcinolone (KENALOG) 0.5 % cream Apply 1 application topically at bedtime and may repeat dose one time if needed.          No Known Allergies  Family History  Problem Relation Age of Onset  . Diabetes Neg Hx     BP 118/78  Pulse 74  Temp(Src) 98.3 F (36.8 C) (Oral)  Ht 5\' 9"  (  1.753 m)  Wt 185 lb 12.8 oz (84.278 kg)  BMI 27.44 kg/m2  SpO2 96%    Review of Systems Denies loc.    Objective:   Physical Exam Pulses: dorsalis pedis intact bilat.   Feet: no deformity.  no ulcer on the feet.  feet are of normal color and temp.  no edema Neuro: sensation is intact to touch on the feet.     Lab Results  Component Value Date   HGBA1C 8.3* 08/17/2010     Assessment & Plan:

## 2010-08-31 ENCOUNTER — Ambulatory Visit: Payer: Managed Care, Other (non HMO) | Admitting: Internal Medicine

## 2010-09-05 ENCOUNTER — Telehealth: Payer: Self-pay | Admitting: Endocrinology

## 2010-09-05 NOTE — Telephone Encounter (Signed)
Please change--same sig

## 2010-09-05 NOTE — Telephone Encounter (Signed)
CHANGED INSURANCE TO [employer].  HUMALOG AND FREESTYLE STRIPS ARE NOT COVERED.  NOVOLOG AND ACCU-CHEK TESTER AND STRIPS ARE COVERED.  PT WANTS TO CHANGE TO THESE.

## 2010-09-06 MED ORDER — ACCU-CHEK AVIVA PLUS W/DEVICE KIT: 1.0000 | PACK | Freq: Once | Status: DC

## 2010-09-06 MED ORDER — GLUCOSE BLOOD VI STRP: ORAL_STRIP | Status: DC

## 2010-09-06 MED ORDER — INSULIN ASPART 100 UNIT/ML ~~LOC~~ SOLN: SUBCUTANEOUS | Status: DC

## 2010-09-06 NOTE — Telephone Encounter (Signed)
Rx changed and sent to pharmacy. Pt informed.

## 2010-09-11 ENCOUNTER — Other Ambulatory Visit: Payer: Self-pay | Admitting: *Deleted

## 2010-09-11 MED ORDER — INSULIN ASPART 100 UNIT/ML ~~LOC~~ SOLN: SUBCUTANEOUS | Status: DC

## 2010-09-11 NOTE — Telephone Encounter (Signed)
R'cd fax from [facility] stating that pt is requesting Novolog Flexpen instead of vials

## 2010-09-17 ENCOUNTER — Other Ambulatory Visit: Payer: Self-pay | Admitting: *Deleted

## 2010-09-17 MED ORDER — ACCU-CHEK SOFT TOUCH LANCETS MISC: Status: DC

## 2010-09-17 NOTE — Telephone Encounter (Signed)
R'cd fax from [facility] for refill of Accu-Chek lancets  Last OV-08/24/2010

## 2010-10-09 ENCOUNTER — Ambulatory Visit: Payer: Self-pay | Admitting: Internal Medicine

## 2010-10-09 ENCOUNTER — Other Ambulatory Visit: Payer: Self-pay | Admitting: Internal Medicine

## 2010-10-15 ENCOUNTER — Other Ambulatory Visit: Payer: Self-pay | Admitting: *Deleted

## 2010-10-15 MED ORDER — ACCU-CHEK MULTICLIX LANCETS MISC: Status: DC

## 2010-10-15 NOTE — Telephone Encounter (Signed)
R'cd fax from [facility] stating that pt needs new rx for lancets. Accu-Chek Multiclick lancets are compatible with pt's meter.

## 2010-11-13 LAB — COMPREHENSIVE METABOLIC PANEL
ALT: 22
AST: 22
Albumin: 3.4 — ABNORMAL LOW
Alkaline Phosphatase: 68
BUN: 23
CO2: 28
Calcium: 9.1
Chloride: 103
Creatinine, Ser: 1.21
GFR calc Af Amer: >60
GFR calc non Af Amer: >60
Glucose, Bld: 46 — ABNORMAL LOW
Potassium: 3.7
Sodium: 140
Total Bilirubin: 0.4
Total Protein: 6.4

## 2010-11-13 LAB — BASIC METABOLIC PANEL
Calcium: 9.1
Chloride: 103
Creatinine, Ser: 1.31
GFR calc Af Amer: >60

## 2010-11-13 LAB — CBC
HCT: 40.1
Hemoglobin: 13.8
MCHC: 34.4
MCV: 95.2
MCV: 96.7
Platelets: 250
RBC: 4.21 — ABNORMAL LOW
RBC: 4.05 — ABNORMAL LOW
RDW: 13
WBC: 9.1
WBC: 10.5

## 2010-11-13 LAB — POCT CARDIAC MARKERS: CKMB, poc: 1.4

## 2010-11-13 LAB — DIFFERENTIAL
Basophils Absolute: 0.1
Basophils Relative: 1
Eosinophils Absolute: 0.4
Eosinophils Relative: 4
Lymphocytes Relative: 32
Lymphs Abs: 2.9
Monocytes Absolute: 1 — ABNORMAL HIGH
Monocytes Relative: 11
Neutro Abs: 4.7
Neutrophils Relative %: 52

## 2010-11-13 LAB — TROPONIN I
Troponin I: 0.02
Troponin I: 0.03

## 2010-11-13 LAB — URINALYSIS, ROUTINE W REFLEX MICROSCOPIC
Bilirubin Urine: NEGATIVE
Glucose, UA: 100 — AB
Hgb urine dipstick: NEGATIVE
Ketones, ur: NEGATIVE
Nitrite: NEGATIVE
Protein, ur: NEGATIVE
Specific Gravity, Urine: 1.022
Urobilinogen, UA: 1
pH: 7

## 2010-11-13 LAB — HEMOGLOBIN A1C
Hgb A1c MFr Bld: 7.7 — ABNORMAL HIGH
Mean Plasma Glucose: 197

## 2010-11-13 LAB — CK TOTAL AND CKMB (NOT AT ARMC)
CK, MB: 2
CK, MB: 2.1
Relative Index: 1
Relative Index: 1.1
Total CK: 233 — ABNORMAL HIGH
Total CK: 190
Total CK: 192

## 2010-11-13 LAB — LIPID PANEL: VLDL: 16

## 2010-11-13 LAB — APTT: aPTT: 22 — ABNORMAL LOW

## 2010-11-13 LAB — PROTIME-INR
INR: 0.8
Prothrombin Time: 11.7

## 2010-11-14 ENCOUNTER — Ambulatory Visit: Payer: Self-pay | Admitting: Internal Medicine

## 2010-11-15 ENCOUNTER — Ambulatory Visit (INDEPENDENT_AMBULATORY_CARE_PROVIDER_SITE_OTHER): Payer: 59 | Admitting: Internal Medicine

## 2010-11-15 ENCOUNTER — Encounter: Payer: Self-pay | Admitting: Internal Medicine

## 2010-11-15 VITALS — BP 114/76 | HR 72 | Temp 98.2°F | Ht 68.0 in | Wt 186.0 lb

## 2010-11-15 DIAGNOSIS — E109 Type 1 diabetes mellitus without complications: Secondary | ICD-10-CM

## 2010-11-15 DIAGNOSIS — I2510 Atherosclerotic heart disease of native coronary artery without angina pectoris: Secondary | ICD-10-CM

## 2010-11-15 DIAGNOSIS — Z0000 Encounter for general adult medical examination without abnormal findings: Secondary | ICD-10-CM

## 2010-11-15 DIAGNOSIS — Z23 Encounter for immunization: Secondary | ICD-10-CM

## 2010-11-15 DIAGNOSIS — I10 Essential (primary) hypertension: Secondary | ICD-10-CM

## 2010-11-15 LAB — BASIC METABOLIC PANEL
CO2: 28 mEq/L (ref 19–32)
Calcium: 9.4 mg/dL (ref 8.4–10.5)
Creatinine, Ser: 1.4 mg/dL (ref 0.4–1.5)
GFR: 55.55 mL/min — ABNORMAL LOW (ref >60.00)

## 2010-11-15 LAB — LIPID PANEL
Cholesterol: 176 mg/dL (ref 0–200)
LDL Cholesterol: 84 mg/dL (ref 0–99)

## 2010-11-15 LAB — HEPATIC FUNCTION PANEL
Alkaline Phosphatase: 60 U/L (ref 39–117)
Bilirubin, Direct: 0 mg/dL (ref 0.0–0.3)
Total Protein: 6.9 g/dL (ref 6.0–8.3)

## 2010-11-15 NOTE — Progress Notes (Signed)
  Subjective:    Patient ID: John Carrillo, male    DOB: 1950-11-12, 60 y.o.   MRN: [id number]  HPI  patient comes in for followup of multiple medical problems including type 2 diabetes, hyperlipidemia, hypertension. The patient does not check blood sugar or blood pressure at home. The patetient does not follow an exercise or diet program. The patient denies any polyuria, polydipsia.  In the past the patient has gone to diabetic treatment center. The patient is tolerating medications  Without difficulty. The patient does admit to medication compliance.   A.m. cbgs are the highest---can be as high as 160.  Pm sugars as high as 150.   Has some hypoglycemic events: maybe one weekly.   Past Medical History  Diagnosis Date  . CORONARY ARTERY DISEASE 08/11/2006  . CVA 12/19/2006    2008  . DIABETES MELLITUS, TYPE I 08/11/2006  . ECZEMA, HANDS 12/15/2009  . HYPERLIPIDEMIA 08/11/2006  . HYPERTENSION 08/11/2006  . MYOCARDIAL INFARCTION, HX OF 08/11/2006    07/14/01   Past Surgical History  Procedure Date  . Tonsilectomy, adenoidectomy, bilateral myringotomy and tubes   . Coronary stent placement     reports that he quit smoking about 10 years ago. He does not have any smokeless tobacco history on file. He reports that he drinks alcohol. His drug history not on file. family history is negative for Diabetes. No Known Allergies    Review of Systems  patient comes in for followup of multiple medical problems including type 2 diabetes, hyperlipidemia, hypertension. The patient does not check blood sugar or blood pressure at home. The patetient does not follow an exercise or diet program. The patient denies any polyuria, polydipsia.  In the past the patient has gone to diabetic treatment center. The patient is tolerating medications  Without difficulty. The patient does admit to medication compliance.      Objective:   Physical Exam  well-developed well-nourished male in no acute distress. HEENT  exam atraumatic, normocephalic, neck supple without jugular venous distention. Chest clear to auscultation cardiac exam S1-S2 are regular. Abdominal exam overweight with bowel sounds, soft and nontender. Extremities no edema. Neurologic exam is alert with a normal gait.        Assessment & Plan:

## 2010-11-15 NOTE — Assessment & Plan Note (Signed)
Check labs today.

## 2010-11-15 NOTE — Assessment & Plan Note (Signed)
No sxs Risk factor modification 

## 2010-11-15 NOTE — Assessment & Plan Note (Signed)
Well controlled Continue meds 

## 2010-12-14 ENCOUNTER — Ambulatory Visit (INDEPENDENT_AMBULATORY_CARE_PROVIDER_SITE_OTHER): Payer: 59 | Admitting: Endocrinology

## 2010-12-14 ENCOUNTER — Encounter: Payer: Self-pay | Admitting: Endocrinology

## 2010-12-14 VITALS — BP 114/80 | HR 64 | Temp 98.5°F | Ht 69.0 in | Wt 187.0 lb

## 2010-12-14 DIAGNOSIS — E109 Type 1 diabetes mellitus without complications: Secondary | ICD-10-CM

## 2010-12-14 NOTE — Patient Instructions (Addendum)
good diet and exercise habits significanly improve the control of your diabetes.  please let me know if you wish to be referred to a dietician.  high blood sugar is very risky to your health.  you should see an eye doctor every year. controlling your blood pressure and cholesterol drastically reduces the damage diabetes does to your body.  this also applies to quitting smoking.  please discuss these with your doctor.  you should take an aspirin every day, unless you have been advised by a doctor not to. check your blood sugar 4 times a day--before the 3 meals, and at bedtime.  also check if you have symptoms of your blood sugar being too high or too low.  please keep a record of the readings and bring it to your next appointment here.  please call us sooner if you are having low blood sugar episodes. Please make a follow-up appointment in 3 months. increase levemir to 23 units at bedtime Take a fixed amount of humalog 3x a day (just before each meal) 05-17-10 units.  If exertion is anticipated, subtract 3 units from that injection.

## 2010-12-14 NOTE — Progress Notes (Signed)
Subjective:    Patient ID: John Carrillo, male    DOB: 05-16-1950, 60 y.o.   MRN: [id number]  HPI Pt returns for f/u of type 1 dm (2008).  He takes a varying dosage of novolog, approx 1 unit/15 grams of carbohydrate.  pt states he feels well in general.  He has hypoglycemia with exertion.  no cbg record, but states cbg's are highest in am (200).   Past Medical History  Diagnosis Date  . CORONARY ARTERY DISEASE 08/11/2006  . CVA 12/19/2006    2008  . DIABETES MELLITUS, TYPE I 08/11/2006  . ECZEMA, HANDS 12/15/2009  . HYPERLIPIDEMIA 08/11/2006  . HYPERTENSION 08/11/2006  . MYOCARDIAL INFARCTION, HX OF 08/11/2006    07/14/01    Past Surgical History  Procedure Date  . Tonsilectomy, adenoidectomy, bilateral myringotomy and tubes   . Coronary stent placement     History   Social History  . Marital Status: Married    Spouse Name: N/A    Number of Children: N/A  . Years of Education: N/A   Occupational History  . Writer   Social History Main Topics  . Smoking status: Former Smoker    Quit date: 05/03/2000  . Smokeless tobacco: Not on file  . Alcohol Use: Yes     1-2/week  . Drug Use: Not on file  . Sexually Active: Not on file   Other Topics Concern  . Not on file   Social History Narrative   Regular exercise-no    Current Outpatient Prescriptions on File Prior to Visit  Medication Sig Dispense Refill  . aspirin 325 MG tablet Take 325 mg by mouth daily.        [name] atorvastatin (LIPITOR) 40 MG tablet Take 40 mg by mouth daily.        . Blood Glucose Monitoring Suppl (ACCU-CHEK AVIVA PLUS) W/DEVICE KIT 1 Device by Does not apply route once.  1 kit  0  . dextromethorphan-guaiFENesin (MUCINEX DM) 30-600 MG per 12 hr tablet Take 1 tablet by mouth every 12 (twelve) hours.        [name] glucose blood (ACCU-CHEK AVIVA) test strip Use as directed to check blood sugar four times daily dx 250.01. Variable glucoses  120 each  11  . ibuprofen (ADVIL,MOTRIN) 200  MG tablet Take 200 mg by mouth every 6 (six) hours as needed.        . Insulin Pen Needle (B-D ULTRAFINE III SHORT PEN) 31G X 8 MM MISC by Does not apply route 4 (four) times daily.        . Lancets (ACCU-CHEK MULTICLIX) lancets Use as instructed to check blood sugar four times daily  200 each  3  . nitroGLYCERIN (NITROSTAT) 0.4 MG SL tablet Place 0.4 mg under the tongue every 5 (five) minutes as needed.        . ramipril (ALTACE) 5 MG capsule take 2 capsules by mouth once daily or as directed  60 capsule  2  . triamcinolone (KENALOG) 0.5 % cream Apply 1 application topically at bedtime and may repeat dose one time if needed.          No Known Allergies  Family History  Problem Relation Age of Onset  . Diabetes Neg Hx     BP 114/80  Pulse 64  Temp(Src) 98.5 F (36.9 C) (Oral)  Ht 5\' 9"  (1.753 m)  Wt 187 lb (84.823 kg)  BMI 27.62 kg/m2  SpO2 98%  Review of Systems Denies  loc    Objective:   Physical Exam VITAL SIGNS:  See vs page GENERAL: no distress SKIN:  Insulin injection sites at the anterior abdomen are normal   Lab Results  Component Value Date   HGBA1C 8.0* 11/15/2010      Assessment & Plan:  Type 1 dm, needs increased rx.  therapy limited by noncompliance with insulin dosing.  i'll do the best i can.

## 2010-12-20 ENCOUNTER — Other Ambulatory Visit: Payer: 59 | Admitting: Internal Medicine

## 2010-12-27 ENCOUNTER — Other Ambulatory Visit: Payer: Self-pay | Admitting: Internal Medicine

## 2010-12-27 ENCOUNTER — Other Ambulatory Visit: Payer: Self-pay | Admitting: Endocrinology

## 2011-03-22 ENCOUNTER — Encounter: Payer: Self-pay | Admitting: Endocrinology

## 2011-03-22 ENCOUNTER — Ambulatory Visit (INDEPENDENT_AMBULATORY_CARE_PROVIDER_SITE_OTHER): Payer: 59 | Admitting: Endocrinology

## 2011-03-22 ENCOUNTER — Other Ambulatory Visit (INDEPENDENT_AMBULATORY_CARE_PROVIDER_SITE_OTHER): Payer: 59

## 2011-03-22 VITALS — BP 112/84 | HR 64 | Temp 96.8°F | Ht 69.0 in | Wt 186.0 lb

## 2011-03-22 DIAGNOSIS — E109 Type 1 diabetes mellitus without complications: Secondary | ICD-10-CM

## 2011-03-22 LAB — HEMOGLOBIN A1C: Hgb A1c MFr Bld: 8.1 % — ABNORMAL HIGH (ref 4.6–6.5)

## 2011-03-22 NOTE — Progress Notes (Signed)
Subjective:    Patient ID: John Carrillo, male    DOB: 1950/05/19, 61 y.o.   MRN: [id number]  HPI Pt returns for f/u of type 1 dm (2008).  He had to reduce levemir to 20 units qhs, due to hypoglycemia.  He says exercise (which is much better recently) lowers his cbg's.  pt states he feels well in general. Past Medical History  Diagnosis Date  . CORONARY ARTERY DISEASE 08/11/2006  . CVA 12/19/2006    2008  . DIABETES MELLITUS, TYPE I 08/11/2006  . ECZEMA, HANDS 12/15/2009  . HYPERLIPIDEMIA 08/11/2006  . HYPERTENSION 08/11/2006  . MYOCARDIAL INFARCTION, HX OF 08/11/2006    07/14/01    Past Surgical History  Procedure Date  . Tonsilectomy, adenoidectomy, bilateral myringotomy and tubes   . Coronary stent placement     History   Social History  . Marital Status: Married    Spouse Name: N/A    Number of Children: N/A  . Years of Education: N/A   Occupational History  . Writer   Social History Main Topics  . Smoking status: Former Smoker    Quit date: 05/03/2000  . Smokeless tobacco: Not on file  . Alcohol Use: Yes     1-2/week  . Drug Use: Not on file  . Sexually Active: Not on file   Other Topics Concern  . Not on file   Social History Narrative   Regular exercise-no    Current Outpatient Prescriptions on File Prior to Visit  Medication Sig Dispense Refill  . aspirin 325 MG tablet Take 325 mg by mouth daily.        [name] atorvastatin (LIPITOR) 40 MG tablet take 1 tablet by mouth once daily  90 tablet  3  . BD ULTRA-FINE PEN NEEDLES 29G X 12.7MM MISC use as directed twice a day  100 each  11  . Blood Glucose Monitoring Suppl (ACCU-CHEK AVIVA PLUS) W/DEVICE KIT 1 Device by Does not apply route once.  1 kit  0  . dextromethorphan-guaiFENesin (MUCINEX DM) 30-600 MG per 12 hr tablet Take 1 tablet by mouth every 12 (twelve) hours.        [name] glucose blood (ACCU-CHEK AVIVA) test strip Use as directed to check blood sugar four times daily dx 250.01.  Variable glucoses  120 each  11  . ibuprofen (ADVIL,MOTRIN) 200 MG tablet Take 200 mg by mouth every 6 (six) hours as needed.        . insulin aspart (NOVOLOG) 100 UNIT/ML injection Inject into the skin 3 times daily before meals 4-12-12units       . insulin detemir (LEVEMIR) 100 UNIT/ML injection Inject 23 Units into the skin at bedtime.        . Insulin Pen Needle (B-D ULTRAFINE III SHORT PEN) 31G X 8 MM MISC by Does not apply route 4 (four) times daily.        . Lancets (ACCU-CHEK MULTICLIX) lancets Use as instructed to check blood sugar four times daily  200 each  3  . nitroGLYCERIN (NITROSTAT) 0.4 MG SL tablet Place 0.4 mg under the tongue every 5 (five) minutes as needed.        . ramipril (ALTACE) 5 MG capsule take 2 capsules by mouth once daily or as directed  60 capsule  2  . triamcinolone (KENALOG) 0.5 % cream Apply 1 application topically at bedtime and may repeat dose one time if needed.  No Known Allergies  Family History  Problem Relation Age of Onset  . Diabetes Neg Hx     BP 112/84  Pulse 64  Temp(Src) 96.8 F (36 C) (Oral)  Ht 5\' 9"  (1.753 m)  Wt 186 lb (84.369 kg)  BMI 27.47 kg/m2  SpO2 97%  Review of Systems Denies loc    Objective:   Physical Exam VITAL SIGNS:  See vs page GENERAL: no distress Pulses: dorsalis pedis intact bilat.   Feet: no deformity.  no ulcer on the feet.  feet are of normal color and temp.  no edema.  On the left gret toenail, he has a minor injury from toenail clipper.   Neuro: sensation is intact to touch on the feet   Lab Results  Component Value Date   HGBA1C 8.1* 03/22/2011      Assessment & Plan:  DM.  needs increased rx

## 2011-03-22 NOTE — Patient Instructions (Addendum)
check your blood sugar 4 times a day--before the 3 meals, and at bedtime.  also check if you have symptoms of your blood sugar being too high or too low.  please keep a record of the readings and bring it to your next appointment here.  please call us sooner if you are having low blood sugar episodes. Please make a follow-up appointment in 3 months. continue levemir 20 units at bedtime.   Take a fixed amount of humalog 3x a day (just before each meal) 05-17-10 units.  If exertion is anticipated, subtract 3 units from that injection.   (update: i left message on phone-tree:  increase humalog to (just before each meal) 06-17-11 units).

## 2011-04-22 ENCOUNTER — Other Ambulatory Visit: Payer: Self-pay | Admitting: Internal Medicine

## 2011-05-17 ENCOUNTER — Ambulatory Visit: Payer: 59 | Admitting: Internal Medicine

## 2011-05-20 ENCOUNTER — Encounter: Payer: Self-pay | Admitting: Internal Medicine

## 2011-05-20 ENCOUNTER — Ambulatory Visit (INDEPENDENT_AMBULATORY_CARE_PROVIDER_SITE_OTHER): Payer: 59 | Admitting: Internal Medicine

## 2011-05-20 VITALS — BP 112/74 | HR 84 | Temp 98.0°F | Wt 184.0 lb

## 2011-05-20 DIAGNOSIS — I2510 Atherosclerotic heart disease of native coronary artery without angina pectoris: Secondary | ICD-10-CM

## 2011-05-20 DIAGNOSIS — E109 Type 1 diabetes mellitus without complications: Secondary | ICD-10-CM

## 2011-05-20 DIAGNOSIS — I10 Essential (primary) hypertension: Secondary | ICD-10-CM

## 2011-05-20 NOTE — Assessment & Plan Note (Signed)
No sxs Will continue current medications

## 2011-05-20 NOTE — Assessment & Plan Note (Signed)
Followed by endocrinology 

## 2011-05-20 NOTE — Assessment & Plan Note (Signed)
BP Readings from Last 3 Encounters:  05/20/11 112/74  03/22/11 112/84  12/14/10 114/80   Controlled Continue meds

## 2011-05-20 NOTE — Progress Notes (Signed)
Patient ID: John Carrillo, male   DOB: [birth year], 61 y.o.   MRN: [id number] patient comes in for followup of multiple medical problems including type 2 diabetes, hyperlipidemia, hypertension. The patient does not check blood sugar or blood pressure at home. The patetient does not follow an exercise or diet program. The patient denies any polyuria, polydipsia.  In the past the patient has gone to diabetic treatment center. The patient is tolerating medications  Without difficulty. The patient does admit to medication compliance.   Past Medical History  Diagnosis Date  . CORONARY ARTERY DISEASE 08/11/2006  . CVA 12/19/2006    2008  . DIABETES MELLITUS, TYPE I 08/11/2006  . ECZEMA, HANDS 12/15/2009  . HYPERLIPIDEMIA 08/11/2006  . HYPERTENSION 08/11/2006  . MYOCARDIAL INFARCTION, HX OF 08/11/2006    07/14/01    History   Social History  . Marital Status: Married    Spouse Name: N/A    Number of Children: N/A  . Years of Education: N/A   Occupational History  . Writer   Social History Main Topics  . Smoking status: Former Smoker    Quit date: 05/03/2000  . Smokeless tobacco: Not on file  . Alcohol Use: Yes     1-2/week  . Drug Use: Not on file  . Sexually Active: Not on file   Other Topics Concern  . Not on file   Social History Narrative   Regular exercise-no    Past Surgical History  Procedure Date  . Tonsilectomy, adenoidectomy, bilateral myringotomy and tubes   . Coronary stent placement     Family History  Problem Relation Age of Onset  . Diabetes Neg Hx     No Known Allergies  Current Outpatient Prescriptions on File Prior to Visit  Medication Sig Dispense Refill  . aspirin 325 MG tablet Take 325 mg by mouth daily.        [name] atorvastatin (LIPITOR) 40 MG tablet take 1 tablet by mouth once daily  90 tablet  3  . BD ULTRA-FINE PEN NEEDLES 29G X 12.7MM MISC use as directed twice a day  100 each  11  . Blood Glucose Monitoring Suppl  (ACCU-CHEK AVIVA PLUS) W/DEVICE KIT 1 Device by Does not apply route once.  1 kit  0  . glucose blood (ACCU-CHEK AVIVA) test strip Use as directed to check blood sugar four times daily dx 250.01. Variable glucoses  120 each  11  . ibuprofen (ADVIL,MOTRIN) 200 MG tablet Take 200 mg by mouth every 6 (six) hours as needed.        . insulin aspart (NOVOLOG) 100 UNIT/ML injection Inject into the skin 3 times daily before meals 4-12-12units       . insulin detemir (LEVEMIR) 100 UNIT/ML injection Inject 20 Units into the skin at bedtime.       . Insulin Pen Needle (B-D ULTRAFINE III SHORT PEN) 31G X 8 MM MISC by Does not apply route 4 (four) times daily.        . Lancets (ACCU-CHEK MULTICLIX) lancets Use as instructed to check blood sugar four times daily  200 each  3  . nitroGLYCERIN (NITROSTAT) 0.4 MG SL tablet Place 0.4 mg under the tongue every 5 (five) minutes as needed.        . ramipril (ALTACE) 5 MG capsule take 2 capsules by mouth once daily or as directed  60 capsule  2     patient denies chest pain, shortness of breath,  orthopnea. Denies lower extremity edema, abdominal pain, change in appetite, change in bowel movements. Patient denies rashes, musculoskeletal complaints. No other specific complaints in a complete review of systems.   BP 112/74  Pulse 84  Temp(Src) 98 F (36.7 C) (Oral)  Wt 184 lb (83.462 kg)  well-developed well-nourished male in no acute distress. HEENT exam atraumatic, normocephalic, neck supple without jugular venous distention. Chest clear to auscultation cardiac exam S1-S2 are regular. Abdominal exam overweight with bowel sounds, soft and nontender. Extremities no edema. Neurologic exam is alert with a normal gait.

## 2011-06-14 ENCOUNTER — Other Ambulatory Visit (INDEPENDENT_AMBULATORY_CARE_PROVIDER_SITE_OTHER): Payer: 59

## 2011-06-14 ENCOUNTER — Ambulatory Visit (INDEPENDENT_AMBULATORY_CARE_PROVIDER_SITE_OTHER): Payer: 59 | Admitting: Endocrinology

## 2011-06-14 ENCOUNTER — Encounter: Payer: Self-pay | Admitting: Endocrinology

## 2011-06-14 VITALS — BP 122/82 | HR 62 | Temp 98.2°F | Ht 69.0 in | Wt 183.0 lb

## 2011-06-14 DIAGNOSIS — I798 Other disorders of arteries, arterioles and capillaries in diseases classified elsewhere: Secondary | ICD-10-CM

## 2011-06-14 DIAGNOSIS — E109 Type 1 diabetes mellitus without complications: Secondary | ICD-10-CM

## 2011-06-14 DIAGNOSIS — E1059 Type 1 diabetes mellitus with other circulatory complications: Secondary | ICD-10-CM

## 2011-06-14 LAB — HEMOGLOBIN A1C: Hgb A1c MFr Bld: 8.3 % — ABNORMAL HIGH (ref 4.6–6.5)

## 2011-06-14 LAB — MICROALBUMIN / CREATININE URINE RATIO
Creatinine,U: 133 mg/dL
Microalb Creat Ratio: 0.5 mg/g (ref 0.0–30.0)

## 2011-06-14 NOTE — Patient Instructions (Addendum)
check your blood sugar 4 times a day--before the 3 meals, and at bedtime.  also check if you have symptoms of your blood sugar being too high or too low.  please keep a record of the readings and bring it to your next appointment here.  please call us sooner if you are having low blood sugar episodes. Please make a follow-up appointment in 3 months. continue levemir 20 units at bedtime.   Take a fixed amount of humalog 3x a day (just before each meal) 06-17-11 units.  If exertion is anticipated, subtract 3 units from that injection.    blood tests are being requested for you today.  You will receive a letter with results.  good diet and exercise habits significanly improve the control of your diabetes.  please let me know if you wish to be referred to a dietician.  high blood sugar is very risky to your health.  you should see an eye doctor every year. controlling your blood pressure and cholesterol drastically reduces the damage diabetes does to your body.  this also applies to quitting smoking.  please discuss these with your doctor.  you should take an aspirin every day, unless you have been advised by a doctor not to.

## 2011-06-14 NOTE — Progress Notes (Signed)
Subjective:    Patient ID: John Carrillo, male    DOB: 1950/04/10, 61 y.o.   MRN: [id number]  HPI Pt returns for f/u of type 1 dm (dx'ed 2008, complicated by CVA and CAD).  no cbg record, but states cbg's are highest in am, but he does not check at hs.  pt states he feels well in general. Past Medical History  Diagnosis Date  . CORONARY ARTERY DISEASE 08/11/2006  . CVA 12/19/2006    2008  . DIABETES MELLITUS, TYPE I 08/11/2006  . ECZEMA, HANDS 12/15/2009  . HYPERLIPIDEMIA 08/11/2006  . HYPERTENSION 08/11/2006  . MYOCARDIAL INFARCTION, HX OF 08/11/2006    07/14/01    Past Surgical History  Procedure Date  . Tonsilectomy, adenoidectomy, bilateral myringotomy and tubes   . Coronary stent placement     History   Social History  . Marital Status: Married    Spouse Name: N/A    Number of Children: N/A  . Years of Education: N/A   Occupational History  . Writer   Social History Main Topics  . Smoking status: Former Smoker    Quit date: 05/03/2000  . Smokeless tobacco: Not on file  . Alcohol Use: Yes     1-2/week  . Drug Use: Not on file  . Sexually Active: Not on file   Other Topics Concern  . Not on file   Social History Narrative   Regular exercise-no    Current Outpatient Prescriptions on File Prior to Visit  Medication Sig Dispense Refill  . aspirin 325 MG tablet Take 325 mg by mouth daily.        [name] atorvastatin (LIPITOR) 40 MG tablet take 1 tablet by mouth once daily  90 tablet  3  . BD ULTRA-FINE PEN NEEDLES 29G X 12.7MM MISC use as directed twice a day  100 each  11  . Blood Glucose Monitoring Suppl (ACCU-CHEK AVIVA PLUS) W/DEVICE KIT 1 Device by Does not apply route once.  1 kit  0  . glucose blood (ACCU-CHEK AVIVA) test strip Use as directed to check blood sugar four times daily dx 250.01. Variable glucoses  120 each  11  . ibuprofen (ADVIL,MOTRIN) 200 MG tablet Take 200 mg by mouth every 6 (six) hours as needed.        . insulin  aspart (NOVOLOG) 100 UNIT/ML injection Inject into the skin 3 times daily before meals 4-12-12units       . insulin detemir (LEVEMIR) 100 UNIT/ML injection Inject 20 Units into the skin at bedtime.       . Insulin Pen Needle (B-D ULTRAFINE III SHORT PEN) 31G X 8 MM MISC by Does not apply route 4 (four) times daily.        . Lancets (ACCU-CHEK MULTICLIX) lancets Use as instructed to check blood sugar four times daily  200 each  3  . nitroGLYCERIN (NITROSTAT) 0.4 MG SL tablet Place 0.4 mg under the tongue every 5 (five) minutes as needed.        . ramipril (ALTACE) 5 MG capsule take 2 capsules by mouth once daily or as directed  60 capsule  2    No Known Allergies  Family History  Problem Relation Age of Onset  . Diabetes Neg Hx     BP 122/82  Pulse 62  Temp(Src) 98.2 F (36.8 C) (Oral)  Ht 5\' 9"  (1.753 m)  Wt 183 lb (83.008 kg)  BMI 27.02 kg/m2  SpO2 96%  Review of  Systems denies hypoglycemia    Objective:   Physical Exam VITAL SIGNS:  See vs page GENERAL: no distress SKIN:  Insulin injection sites at the anterior abdomen are normal.     Assessment & Plan:  DM, with apparently improved control

## 2011-06-17 ENCOUNTER — Telehealth: Payer: Self-pay | Admitting: *Deleted

## 2011-06-17 NOTE — Telephone Encounter (Signed)
Called pt to inform of A1c results, pt informed (letter also mailed to pt). 

## 2011-06-21 ENCOUNTER — Other Ambulatory Visit: Payer: Self-pay | Admitting: Internal Medicine

## 2011-09-16 ENCOUNTER — Other Ambulatory Visit: Payer: Self-pay | Admitting: Endocrinology

## 2011-09-27 ENCOUNTER — Other Ambulatory Visit (INDEPENDENT_AMBULATORY_CARE_PROVIDER_SITE_OTHER): Payer: 59

## 2011-09-27 ENCOUNTER — Encounter: Payer: Self-pay | Admitting: Endocrinology

## 2011-09-27 ENCOUNTER — Ambulatory Visit (INDEPENDENT_AMBULATORY_CARE_PROVIDER_SITE_OTHER): Payer: 59 | Admitting: Endocrinology

## 2011-09-27 VITALS — BP 130/90 | HR 80 | Temp 97.9°F | Resp 16 | Ht 70.0 in | Wt 185.0 lb

## 2011-09-27 DIAGNOSIS — E1059 Type 1 diabetes mellitus with other circulatory complications: Secondary | ICD-10-CM

## 2011-09-27 DIAGNOSIS — I798 Other disorders of arteries, arterioles and capillaries in diseases classified elsewhere: Secondary | ICD-10-CM

## 2011-09-27 LAB — MICROALBUMIN / CREATININE URINE RATIO: Microalb Creat Ratio: 0.2 mg/g (ref 0.0–30.0)

## 2011-09-27 NOTE — Progress Notes (Signed)
Subjective:    Patient ID: John Carrillo, male    DOB: 1950-03-31, 61 y.o.   MRN: [id number]  HPI Pt returns for f/u of type 1 dm (dx'ed 2008, complicated by CVA and CAD).  no cbg record, but states cbg's are highest in am, but he does not check at hs.  He says it is lowest in the afternoon.  pt states he feels well in general.  He still takes a varying dosage of novolog.   Past Medical History  Diagnosis Date  . CORONARY ARTERY DISEASE 08/11/2006  . CVA 12/19/2006    2008  . DIABETES MELLITUS, TYPE I 08/11/2006  . ECZEMA, HANDS 12/15/2009  . HYPERLIPIDEMIA 08/11/2006  . HYPERTENSION 08/11/2006  . MYOCARDIAL INFARCTION, HX OF 08/11/2006    07/14/01    Past Surgical History  Procedure Date  . Tonsilectomy, adenoidectomy, bilateral myringotomy and tubes   . Coronary stent placement     History   Social History  . Marital Status: Married    Spouse Name: N/A    Number of Children: N/A  . Years of Education: N/A   Occupational History  . Writer   Social History Main Topics  . Smoking status: Former Smoker    Quit date: 05/03/2000  . Smokeless tobacco: Not on file  . Alcohol Use: Yes     1-2/week  . Drug Use: Not on file  . Sexually Active: Not on file   Other Topics Concern  . Not on file   Social History Narrative   Regular exercise-no    Current Outpatient Prescriptions on File Prior to Visit  Medication Sig Dispense Refill  . aspirin 325 MG tablet Take 325 mg by mouth daily.        [name] atorvastatin (LIPITOR) 40 MG tablet take 1 tablet by mouth once daily  90 tablet  3  . BD ULTRA-FINE PEN NEEDLES 29G X 12.7MM MISC use as directed twice a day  100 each  11  . Blood Glucose Monitoring Suppl (ACCU-CHEK AVIVA PLUS) W/DEVICE KIT 1 Device by Does not apply route once.  1 kit  0  . ibuprofen (ADVIL,MOTRIN) 200 MG tablet Take 200 mg by mouth every 6 (six) hours as needed.        . insulin aspart (NOVOLOG FLEXPEN) 100 UNIT/ML injection Inject  subcutaneously three times a day (just before each meal) 06-17-11 units  15 mL  5  . insulin detemir (LEVEMIR FLEXPEN) 100 UNIT/ML injection       . Insulin Pen Needle (B-D ULTRAFINE III SHORT PEN) 31G X 8 MM MISC by Does not apply route 4 (four) times daily.        . Lancets (ACCU-CHEK MULTICLIX) lancets Use as instructed to check blood sugar four times daily  200 each  3  . nitroGLYCERIN (NITROSTAT) 0.4 MG SL tablet Place 0.4 mg under the tongue every 5 (five) minutes as needed.        . ramipril (ALTACE) 5 MG capsule take 2 capsules by mouth once daily or as directed  60 capsule  2    No Known Allergies  Family History  Problem Relation Age of Onset  . Diabetes Neg Hx     BP 130/90  Pulse 80  Temp 97.9 F (36.6 C) (Oral)  Resp 16  Ht 5\' 10"  (1.778 m)  Wt 185 lb (83.915 kg)  BMI 26.54 kg/m2    Review of Systems Denies LOC    Objective:  Physical Exam Pulses: dorsalis pedis intact bilat.   Feet: no deformity.  no ulcer on the feet.  feet are of normal color and temp.  no edema Neuro: sensation is intact to touch on the feet.     Lab Results  Component Value Date   HGBA1C 7.9* 09/27/2011      Assessment & Plan:  DM.  needs increased rx

## 2011-09-27 NOTE — Patient Instructions (Addendum)
check your blood sugar 4 times a day--before the 3 meals, and at bedtime.  also check if you have symptoms of your blood sugar being too high or too low.  please keep a record of the readings and bring it to your next appointment here.  please call us sooner if you are having low blood sugar episodes. Please make a follow-up appointment in 3 months. increase levemir to 22 units at bedtime.   Take a fixed amount of novolog 3x a day (just before each meal) 06-17-11 units.  If exertion is anticipated, subtract 3 units from that injection.    blood tests are being requested for you today.  You will receive a letter with results.

## 2011-10-01 ENCOUNTER — Telehealth: Payer: Self-pay | Admitting: *Deleted

## 2011-10-01 NOTE — Telephone Encounter (Signed)
Called pt to inform of lab results, pt informed (letter also mailed to pt). 

## 2011-11-15 ENCOUNTER — Encounter: Payer: Self-pay | Admitting: Internal Medicine

## 2011-11-15 ENCOUNTER — Ambulatory Visit: Payer: 59 | Admitting: Internal Medicine

## 2011-11-15 ENCOUNTER — Ambulatory Visit (INDEPENDENT_AMBULATORY_CARE_PROVIDER_SITE_OTHER): Payer: 59 | Admitting: Internal Medicine

## 2011-11-15 VITALS — BP 124/84 | HR 68 | Temp 98.0°F | Ht 69.0 in | Wt 183.0 lb

## 2011-11-15 DIAGNOSIS — Z2911 Encounter for prophylactic immunotherapy for respiratory syncytial virus (RSV): Secondary | ICD-10-CM

## 2011-11-15 DIAGNOSIS — Z23 Encounter for immunization: Secondary | ICD-10-CM

## 2011-11-15 DIAGNOSIS — I10 Essential (primary) hypertension: Secondary | ICD-10-CM

## 2011-11-15 DIAGNOSIS — E785 Hyperlipidemia, unspecified: Secondary | ICD-10-CM

## 2011-11-15 DIAGNOSIS — Z0000 Encounter for general adult medical examination without abnormal findings: Secondary | ICD-10-CM

## 2011-11-15 DIAGNOSIS — E1059 Type 1 diabetes mellitus with other circulatory complications: Secondary | ICD-10-CM

## 2011-11-15 DIAGNOSIS — I2510 Atherosclerotic heart disease of native coronary artery without angina pectoris: Secondary | ICD-10-CM

## 2011-11-15 LAB — BASIC METABOLIC PANEL
CO2: 29 mEq/L (ref 19–32)
Chloride: 108 mEq/L (ref 96–112)
Glucose, Bld: 88 mg/dL (ref 70–99)
Potassium: 4.9 mEq/L (ref 3.5–5.1)
Sodium: 144 mEq/L (ref 135–145)

## 2011-11-15 LAB — CBC WITH DIFFERENTIAL/PLATELET
Basophils Absolute: 0 10*3/uL (ref 0.0–0.1)
Eosinophils Absolute: 0.3 10*3/uL (ref 0.0–0.7)
HCT: 44.4 % (ref 39.0–52.0)
Hemoglobin: 14.6 g/dL (ref 13.0–17.0)
Lymphs Abs: 2.4 10*3/uL (ref 0.7–4.0)
MCHC: 32.8 g/dL (ref 30.0–36.0)
Neutro Abs: 5 10*3/uL (ref 1.4–7.7)
RDW: 13.3 % (ref 11.5–14.6)

## 2011-11-15 LAB — HEPATIC FUNCTION PANEL: Total Bilirubin: 0.8 mg/dL (ref 0.3–1.2)

## 2011-11-15 LAB — LIPID PANEL
HDL: 53.5 mg/dL (ref >39.00)
Total CHOL/HDL Ratio: 3
VLDL: 17.8 mg/dL (ref 0.0–40.0)

## 2011-11-15 NOTE — Progress Notes (Signed)
Patient ID: John Carrillo, male   DOB: Sep 01, 1950, 61 y.o.   MRN: [id number]   patient comes in for followup of multiple medical problems including type 1 diabetes, hyperlipidemia, hypertension. The patient does check blood sugar (40s-300s)- "they are all over the place (mostly "my fault") .[name] The patetient does not follow an exercise or diet program. The patient denies any polyuria, polydipsia.  In the past the patient has gone to diabetic treatment center. The patient is tolerating medications  Without difficulty. The patient does admit to medication compliance.   Past Medical History  Diagnosis Date  . CORONARY ARTERY DISEASE 08/11/2006  . CVA 12/19/2006    2008  . DIABETES MELLITUS, TYPE I 08/11/2006  . ECZEMA, HANDS 12/15/2009  . HYPERLIPIDEMIA 08/11/2006  . HYPERTENSION 08/11/2006  . MYOCARDIAL INFARCTION, HX OF 08/11/2006    07/14/01    History   Social History  . Marital Status: Married    Spouse Name: N/A    Number of Children: N/A  . Years of Education: N/A   Occupational History  . Writer   Social History Main Topics  . Smoking status: Former Smoker    Quit date: 05/03/2000  . Smokeless tobacco: Not on file  . Alcohol Use: Yes     1-2/week  . Drug Use: Not on file  . Sexually Active: Not on file   Other Topics Concern  . Not on file   Social History Narrative   Regular exercise-no    Past Surgical History  Procedure Date  . Tonsilectomy, adenoidectomy, bilateral myringotomy and tubes   . Coronary stent placement     Family History  Problem Relation Age of Onset  . Diabetes Neg Hx     No Known Allergies  Current Outpatient Prescriptions on File Prior to Visit  Medication Sig Dispense Refill  . ACCU-CHEK AVIVA PLUS test strip 1 each by Other route 4 (four) times daily.       [name] aspirin 325 MG tablet Take 325 mg by mouth daily.        . BD ULTRA-FINE PEN NEEDLES 29G X 12.7MM MISC use as directed twice a day  100 each  11  .  ibuprofen (ADVIL,MOTRIN) 200 MG tablet Take 200 mg by mouth every 6 (six) hours as needed.        . insulin detemir (LEVEMIR FLEXPEN) 100 UNIT/ML injection 21-22 units qhs      . Insulin Pen Needle (B-D ULTRAFINE III SHORT PEN) 31G X 8 MM MISC by Does not apply route 4 (four) times daily.        . nitroGLYCERIN (NITROSTAT) 0.4 MG SL tablet Place 0.4 mg under the tongue every 5 (five) minutes as needed.        [name] DISCONTD: atorvastatin (LIPITOR) 40 MG tablet take 1 tablet by mouth once daily  90 tablet  3  . DISCONTD: ramipril (ALTACE) 5 MG capsule take 2 capsules by mouth once daily or as directed  60 capsule  2     patient denies chest pain, shortness of breath, orthopnea. Denies lower extremity edema, abdominal pain, change in appetite, change in bowel movements. Patient denies rashes, musculoskeletal complaints. No other specific complaints in a complete review of systems.   BP 124/84  Pulse 68  Temp 98 F (36.7 C) (Oral)  Ht 5\' 9"  (1.753 m)  Wt 183 lb (83.008 kg)  BMI 27.02 kg/m2   well-developed well-nourished male in no acute distress. HEENT exam atraumatic, normocephalic,  neck supple without jugular venous distention. Chest clear to auscultation cardiac exam S1-S2 are regular. Abdominal exam overweight with bowel sounds, soft and nontender. Extremities no edema. Neurologic exam is alert with a normal gait.

## 2011-11-15 NOTE — Assessment & Plan Note (Signed)
Needs labs Check today 

## 2011-11-16 ENCOUNTER — Other Ambulatory Visit: Payer: Self-pay | Admitting: Endocrinology

## 2011-11-17 NOTE — Assessment & Plan Note (Signed)
BP Readings from Last 3 Encounters:  11/15/11 124/84  09/27/11 130/90  06/14/11 122/82

## 2011-11-17 NOTE — Assessment & Plan Note (Signed)
No sxs Continue risk factor modification 

## 2011-11-17 NOTE — Assessment & Plan Note (Signed)
Lipid Panel     Component Value Date/Time   CHOL 179 11/15/2011 1050   TRIG 89.0 11/15/2011 1050   HDL 53.50 11/15/2011 1050   CHOLHDL 3 11/15/2011 1050   VLDL 17.8 11/15/2011 1050   LDLCALC 108* 11/15/2011 1050    Fair control

## 2011-12-02 ENCOUNTER — Other Ambulatory Visit: Payer: Self-pay | Admitting: Internal Medicine

## 2011-12-27 ENCOUNTER — Encounter: Payer: Self-pay | Admitting: Endocrinology

## 2011-12-27 ENCOUNTER — Ambulatory Visit (INDEPENDENT_AMBULATORY_CARE_PROVIDER_SITE_OTHER): Payer: 59 | Admitting: Endocrinology

## 2011-12-27 VITALS — BP 132/80 | HR 76 | Temp 97.8°F | Wt 184.0 lb

## 2011-12-27 DIAGNOSIS — E1059 Type 1 diabetes mellitus with other circulatory complications: Secondary | ICD-10-CM

## 2011-12-27 NOTE — Progress Notes (Signed)
Subjective:    Patient ID: John Carrillo, male    DOB: January 13, 1951, 61 y.o.   MRN: [id number]  HPI Pt returns for f/u of type 1 dm (dx'ed 2008, complicated by CVA and CAD).  no cbg record, but states cbg's are sometimes low during the day, with exertion.  He takes a varying dosage of novolog.   Past Medical History  Diagnosis Date  . CORONARY ARTERY DISEASE 08/11/2006  . CVA 12/19/2006    2008  . DIABETES MELLITUS, TYPE I 08/11/2006  . ECZEMA, HANDS 12/15/2009  . HYPERLIPIDEMIA 08/11/2006  . HYPERTENSION 08/11/2006  . MYOCARDIAL INFARCTION, HX OF 08/11/2006    07/14/01    Past Surgical History  Procedure Date  . Tonsilectomy, adenoidectomy, bilateral myringotomy and tubes   . Coronary stent placement     History   Social History  . Marital Status: Married    Spouse Name: N/A    Number of Children: N/A  . Years of Education: N/A   Occupational History  . Writer   Social History Main Topics  . Smoking status: Former Smoker    Quit date: 05/03/2000  . Smokeless tobacco: Not on file  . Alcohol Use: Yes     Comment: 1-2/week  . Drug Use: Not on file  . Sexually Active: Not on file   Other Topics Concern  . Not on file   Social History Narrative   Regular exercise-no    Current Outpatient Prescriptions on File Prior to Visit  Medication Sig Dispense Refill  . ACCU-CHEK AVIVA PLUS test strip 1 each by Other route 4 (four) times daily.       [name] ACCU-CHEK AVIVA PLUS test strip CHECK BLOOD SUGAR four times a day  100 strip  11  . aspirin 325 MG tablet Take 325 mg by mouth daily.        [name] atorvastatin (LIPITOR) 40 MG tablet       . BD ULTRA-FINE PEN NEEDLES 29G X 12.7MM MISC use as directed twice a day  100 each  11  . ibuprofen (ADVIL,MOTRIN) 200 MG tablet Take 200 mg by mouth every 6 (six) hours as needed.        . insulin aspart (NOVOLOG) 100 UNIT/ML injection Inject subcutaneously three times a day (just before each meal) 05-17-10 units      .  insulin detemir (LEVEMIR FLEXPEN) 100 UNIT/ML injection 21-22 units qhs      . Insulin Pen Needle (B-D ULTRAFINE III SHORT PEN) 31G X 8 MM MISC by Does not apply route 4 (four) times daily.        . nitroGLYCERIN (NITROSTAT) 0.4 MG SL tablet Place 0.4 mg under the tongue every 5 (five) minutes as needed.        . ramipril (ALTACE) 5 MG capsule       . ramipril (ALTACE) 5 MG capsule take 2 capsules by mouth once daily or as directed  30 capsule  2    No Known Allergies  Family History  Problem Relation Age of Onset  . Diabetes Neg Hx     BP 132/80  Pulse 76  Temp 97.8 F (36.6 C) (Oral)  Wt 184 lb (83.462 kg)  SpO2 96%    Review of Systems Denies LOC    Objective:   Physical Exam VITAL SIGNS:  See vs page GENERAL: no distress SKIN:  Insulin injection sites at the anterior abdomen are normal.  Assessment & Plan:  DM, uncertain control

## 2011-12-27 NOTE — Patient Instructions (Addendum)
check your blood sugar 4 times a day--before the 3 meals, and at bedtime.  also check if you have symptoms of your blood sugar being too high or too low.  please keep a record of the readings and bring it to your next appointment here.  please call us sooner if you are having low blood sugar episodes. Please make a follow-up appointment in 3 months. Take levemir, 22 units at bedtime.   Take novolog 3x a day (just before each meal) 06-17-11 units.  If exertion is anticipated, subtract 3 units from that injection.    blood tests are being requested for you today.  We'll contact you with results.

## 2011-12-30 ENCOUNTER — Telehealth: Payer: Self-pay | Admitting: *Deleted

## 2011-12-30 NOTE — Telephone Encounter (Signed)
Message copied by Elnora Morrison on Mon Dec 30, 2011  3:27 PM ------      Message from: Romero Belling      Created: Mon Dec 30, 2011  8:23 AM       please call patient:      Blood sugar is still high      Please take a fixed amount of novolog, as listed on the paper      i'll see you next time.

## 2011-12-30 NOTE — Telephone Encounter (Signed)
PATIENT NOTIFIED OF HGBA1C RESULTS AND INSTRUCTIONS OF TAKING FIXED AMOUNT OF NOVOLOG AS LISTED ON PATIENT PAPER . PATIENT UNDERSTANDS DIRECTIONS OF THIS.

## 2012-02-21 ENCOUNTER — Other Ambulatory Visit: Payer: Self-pay | Admitting: Internal Medicine

## 2012-03-18 ENCOUNTER — Encounter: Payer: Self-pay | Admitting: Family

## 2012-03-18 ENCOUNTER — Ambulatory Visit (INDEPENDENT_AMBULATORY_CARE_PROVIDER_SITE_OTHER): Payer: 59 | Admitting: Family

## 2012-03-18 VITALS — BP 140/90 | HR 81 | Temp 98.6°F | Wt 191.0 lb

## 2012-03-18 DIAGNOSIS — B354 Tinea corporis: Secondary | ICD-10-CM

## 2012-03-18 MED ORDER — CLOTRIMAZOLE-BETAMETHASONE 1-0.05 % EX CREA: TOPICAL_CREAM | Freq: Two times a day (BID) | CUTANEOUS | Status: DC

## 2012-03-18 MED ORDER — FLUCONAZOLE 150 MG PO TABS: 150.0000 mg | ORAL_TABLET | Freq: Once | ORAL | Status: DC

## 2012-03-18 NOTE — Patient Instructions (Addendum)
1. Selsun blue shampoo OTC twice daily to the affected areas. Leave x 5-10 minutes then rinse 2. Diflucan 150mg  once a week x 4 weeks.  3. Lotrisone cream to the affected area twice a day.   Yeast Infection of the Skin Some yeast on the skin is normal, but sometimes it causes an infection. If you have a yeast infection, it shows up as white or light brown patches on brown skin. You can see it better in the summer on tan skin. It causes light-colored holes in your suntan. It can happen on any area of the body. This cannot be passed from person to person. HOME CARE  Scrub your skin daily with a dandruff shampoo. Your rash may take a couple weeks to get well.  Do not scratch or itch the rash. GET HELP RIGHT AWAY IF:   You get another infection from scratching. The skin may get warm, red, and may ooze fluid.  The infection does not seem to be getting better. MAKE SURE YOU:  Understand these instructions.  Will watch your condition.  Will get help right away if you are not doing well or get worse. Document Released: 01/04/2008 Document Revised: 04/15/2011 Document Reviewed: 01/04/2008 [facility] Patient Information 2013 [place], [place].

## 2012-03-18 NOTE — Progress Notes (Signed)
Subjective:    Patient ID: John Carrillo, male    DOB: 08/06/1950, 62 y.o.   MRN: [id number]  HPI 62 year old white male, nonsmoker is in today with complaints of a red rash noted originally began underneath his armpits x3 weeks ago. It has since spread to his chest and in his groin area. He has changed laundry detergents. Denies any contact with any other rashes. No one at home with the rash. Has a history of eczema but feels this rash is different. He has a history of type 2 diabetes uncontrolled with last hemoglobin A1c 8.1. He has not taken his insulin this morning.   Review of Systems  Constitutional: Negative.   HENT: Negative.   Respiratory: Negative.   Cardiovascular: Negative.   Gastrointestinal: Negative.   Endocrine: Negative for polydipsia and polyphagia.  Genitourinary: Negative.   Musculoskeletal: Negative.   Skin: Positive for rash.       Red, waxy rash noted underneath arms scattered over the chest and in the groin area.  Allergic/Immunologic: Negative.   Neurological: Negative.   Hematological: Negative.   Psychiatric/Behavioral: Negative.    Past Medical History  Diagnosis Date  . CORONARY ARTERY DISEASE 08/11/2006  . CVA 12/19/2006    2008  . DIABETES MELLITUS, TYPE I 08/11/2006  . ECZEMA, HANDS 12/15/2009  . HYPERLIPIDEMIA 08/11/2006  . HYPERTENSION 08/11/2006  . MYOCARDIAL INFARCTION, HX OF 08/11/2006    07/14/01    History   Social History  . Marital Status: Married    Spouse Name: N/A    Number of Children: N/A  . Years of Education: N/A   Occupational History  . Writer   Social History Main Topics  . Smoking status: Former Smoker    Quit date: 05/03/2000  . Smokeless tobacco: Not on file  . Alcohol Use: Yes     Comment: 1-2/week  . Drug Use: Not on file  . Sexually Active: Not on file   Other Topics Concern  . Not on file   Social History Narrative   Regular exercise-no    Past Surgical History  Procedure  Laterality Date  . Tonsilectomy, adenoidectomy, bilateral myringotomy and tubes    . Coronary stent placement      Family History  Problem Relation Age of Onset  . Diabetes Neg Hx     No Known Allergies  Current Outpatient Prescriptions on File Prior to Visit  Medication Sig Dispense Refill  . ACCU-CHEK AVIVA PLUS test strip 1 each by Other route 4 (four) times daily.       [name] ACCU-CHEK AVIVA PLUS test strip CHECK BLOOD SUGAR four times a day  100 strip  11  . aspirin 325 MG tablet Take 325 mg by mouth daily.        [name] atorvastatin (LIPITOR) 40 MG tablet       . atorvastatin (LIPITOR) 40 MG tablet take 1 tablet by mouth once daily  30 tablet  3  . BD ULTRA-FINE PEN NEEDLES 29G X 12.7MM MISC use as directed twice a day  100 each  11  . ibuprofen (ADVIL,MOTRIN) 200 MG tablet Take 200 mg by mouth every 6 (six) hours as needed.        . insulin aspart (NOVOLOG) 100 UNIT/ML injection Inject subcutaneously three times a day (just before each meal) 05-17-10 units      . insulin detemir (LEVEMIR FLEXPEN) 100 UNIT/ML injection 21-22 units qhs      . Insulin Pen Needle (B-D  ULTRAFINE III SHORT PEN) 31G X 8 MM MISC by Does not apply route 4 (four) times daily.        . nitroGLYCERIN (NITROSTAT) 0.4 MG SL tablet Place 0.4 mg under the tongue every 5 (five) minutes as needed.        . ramipril (ALTACE) 5 MG capsule       . ramipril (ALTACE) 5 MG capsule take 2 capsules by mouth once daily or as directed  30 capsule  2  . Lancets (ACCU-CHEK MULTICLIX) lancets Use as instructed to check blood sugar four times daily  200 each  3   No current facility-administered medications on file prior to visit.    BP 140/90  Pulse 81  Temp(Src) 98.6 F (37 C) (Oral)  Wt 191 lb (86.637 kg)  BMI 28.19 kg/m2  SpO2 98%chart    Objective:   Physical Exam  Constitutional: He is oriented to person, place, and time. He appears well-developed and well-nourished.  Neck: Normal range of motion. Neck supple.   Cardiovascular: Normal rate, regular rhythm and normal heart sounds.   Pulmonary/Chest: Effort normal and breath sounds normal.  Neurological: He is alert and oriented to person, place, and time.  Skin: Rash noted. There is erythema.  Rash to the underarms bilaterally that spread over the chest and in the groin area.   Psychiatric: He has a normal mood and affect.          Assessment & Plan:  Assessment: 1. Tinea corporis 2. Type 2 diabetes uncontrolled 3. Eczema  Plan: Diflucan 150 mg once weekly x4 weeks. Selsun Blue shampoo  applied the affected area and leave 5-10 minutes and redness twice a. Lotrisone cream  Applied to the affected area twice a day. Patient advised to take his medications as scheduled to prevent hyperglycemia. Recheck as scheduled, and as needed.

## 2012-04-03 ENCOUNTER — Ambulatory Visit: Payer: 59 | Admitting: Endocrinology

## 2012-04-15 ENCOUNTER — Other Ambulatory Visit: Payer: Self-pay

## 2012-04-15 MED ORDER — CLOTRIMAZOLE-BETAMETHASONE 1-0.05 % EX CREA: TOPICAL_CREAM | Freq: Two times a day (BID) | CUTANEOUS | Status: DC

## 2012-04-17 ENCOUNTER — Encounter: Payer: Self-pay | Admitting: Endocrinology

## 2012-04-17 ENCOUNTER — Ambulatory Visit (INDEPENDENT_AMBULATORY_CARE_PROVIDER_SITE_OTHER): Payer: 59 | Admitting: Endocrinology

## 2012-04-17 VITALS — BP 132/74 | HR 80 | Wt 190.0 lb

## 2012-04-17 DIAGNOSIS — E1059 Type 1 diabetes mellitus with other circulatory complications: Secondary | ICD-10-CM

## 2012-04-17 MED ORDER — CLOTRIMAZOLE-BETAMETHASONE 1-0.05 % EX CREA: TOPICAL_CREAM | Freq: Two times a day (BID) | CUTANEOUS | Status: DC

## 2012-04-17 NOTE — Patient Instructions (Addendum)
check your blood sugar 4 times a day--before the 3 meals, and at bedtime.  also check if you have symptoms of your blood sugar being too high or too low.  please keep a record of the readings and bring it to your next appointment here.  please call us sooner if you are having low blood sugar episodes. Please make a follow-up appointment in 3 months.   Take levemir, 22 units at bedtime.   Take novolog 3x a day (just before each meal) 06-17-11 units.  If exertion is anticipated, subtract 5 units from that injection.    blood tests are being requested for you today.  We'll contact you with results.

## 2012-04-17 NOTE — Progress Notes (Signed)
Subjective:    Patient ID: John Carrillo, male    DOB: [birth year], 62 y.o.   MRN: [id number]  HPI Pt returns for f/u of type 1 dm (dx'ed 2008, complicated by CVA and CAD; he has never had severe hypoglycemia or DKA).  no cbg record, but states cbg's are still extremely variable.  He has hypoglycemia approx qod.  There is no trend throughout the day.  He still takes a varying dosage of novolog.  Past Medical History  Diagnosis Date  . CORONARY ARTERY DISEASE 08/11/2006  . CVA 12/19/2006    2008  . DIABETES MELLITUS, TYPE I 08/11/2006  . ECZEMA, HANDS 12/15/2009  . HYPERLIPIDEMIA 08/11/2006  . HYPERTENSION 08/11/2006  . MYOCARDIAL INFARCTION, HX OF 08/11/2006    07/14/01    Past Surgical History  Procedure Laterality Date  . Tonsilectomy, adenoidectomy, bilateral myringotomy and tubes    . Coronary stent placement      History   Social History  . Marital Status: Married    Spouse Name: N/A    Number of Children: N/A  . Years of Education: N/A   Occupational History  . Writer   Social History Main Topics  . Smoking status: Former Smoker    Quit date: 05/03/2000  . Smokeless tobacco: Not on file  . Alcohol Use: Yes     Comment: 1-2/week  . Drug Use: Not on file  . Sexually Active: Not on file   Other Topics Concern  . Not on file   Social History Narrative   Regular exercise-no    Current Outpatient Prescriptions on File Prior to Visit  Medication Sig Dispense Refill  . ACCU-CHEK AVIVA PLUS test strip 1 each by Other route 4 (four) times daily.       [name] ACCU-CHEK AVIVA PLUS test strip CHECK BLOOD SUGAR four times a day  100 strip  11  . aspirin 325 MG tablet Take 325 mg by mouth daily.        [name] atorvastatin (LIPITOR) 40 MG tablet       . atorvastatin (LIPITOR) 40 MG tablet take 1 tablet by mouth once daily  30 tablet  3  . BD ULTRA-FINE PEN NEEDLES 29G X 12.7MM MISC use as directed twice a day  100 each  11  . fluconazole (DIFLUCAN) 150 MG  tablet Take 1 tablet (150 mg total) by mouth once. Once a week x 4 weeks  4 tablet  0  . ibuprofen (ADVIL,MOTRIN) 200 MG tablet Take 200 mg by mouth every 6 (six) hours as needed.        . insulin aspart (NOVOLOG) 100 UNIT/ML injection Inject subcutaneously three times a day (just before each meal) 05-17-10 units      . insulin detemir (LEVEMIR FLEXPEN) 100 UNIT/ML injection 21-22 units qhs      . Insulin Pen Needle (B-D ULTRAFINE III SHORT PEN) 31G X 8 MM MISC by Does not apply route 4 (four) times daily.        . nitroGLYCERIN (NITROSTAT) 0.4 MG SL tablet Place 0.4 mg under the tongue every 5 (five) minutes as needed.        . ramipril (ALTACE) 5 MG capsule       . ramipril (ALTACE) 5 MG capsule take 2 capsules by mouth once daily or as directed  30 capsule  2  . Lancets (ACCU-CHEK MULTICLIX) lancets Use as instructed to check blood sugar four times daily  200 each  3  No current facility-administered medications on file prior to visit.   No Known Allergies  Family History  Problem Relation Age of Onset  . Diabetes Neg Hx    BP 132/74  Pulse 80  Wt 190 lb (86.183 kg)  BMI 28.05 kg/m2  SpO2 97%  Review of Systems Denies LOC    Objective:   Physical Exam Pulses: dorsalis pedis intact bilat.   Feet: no deformity.  no ulcer on the feet.  feet are of normal color and temp.  no edema Neuro: sensation is intact to touch on the feet   Lab Results  Component Value Date   HGBA1C 8.2* 04/17/2012      Assessment & Plan:  DM: needs increased rx.  i again advised pt to take a fixed dosage of insulin--he agrees

## 2012-05-04 ENCOUNTER — Ambulatory Visit: Payer: 59 | Admitting: Internal Medicine

## 2012-05-29 ENCOUNTER — Ambulatory Visit: Payer: 59 | Admitting: Internal Medicine

## 2012-06-27 ENCOUNTER — Other Ambulatory Visit: Payer: Self-pay | Admitting: Endocrinology

## 2012-06-27 ENCOUNTER — Other Ambulatory Visit: Payer: Self-pay | Admitting: Internal Medicine

## 2012-06-30 ENCOUNTER — Other Ambulatory Visit: Payer: Self-pay | Admitting: *Deleted

## 2012-06-30 ENCOUNTER — Ambulatory Visit: Payer: 59 | Admitting: Internal Medicine

## 2012-06-30 MED ORDER — INSULIN ASPART 100 UNIT/ML ~~LOC~~ SOLN: SUBCUTANEOUS | Status: DC

## 2012-07-24 ENCOUNTER — Encounter: Payer: Self-pay | Admitting: Endocrinology

## 2012-07-24 ENCOUNTER — Ambulatory Visit (INDEPENDENT_AMBULATORY_CARE_PROVIDER_SITE_OTHER): Payer: 59 | Admitting: Endocrinology

## 2012-07-24 ENCOUNTER — Telehealth: Payer: Self-pay | Admitting: *Deleted

## 2012-07-24 VITALS — BP 122/80 | HR 78 | Ht 69.0 in | Wt 186.0 lb

## 2012-07-24 DIAGNOSIS — E1059 Type 1 diabetes mellitus with other circulatory complications: Secondary | ICD-10-CM

## 2012-07-24 MED ORDER — INSULIN ASPART PROT & ASPART (70-30 MIX) 100 UNIT/ML PEN: 15.0000 [IU] | PEN_INJECTOR | Freq: Two times a day (BID) | SUBCUTANEOUS | Status: DC

## 2012-07-24 NOTE — Progress Notes (Signed)
Subjective:    Patient ID: John Carrillo, male    DOB: 19-Mar-1950, 62 y.o.   MRN: [id number]  HPI Pt returns for f/u of type 1 dm (dx'ed 2008; He has mild if any neuropathy of the lower extremities; he has associated complications of CVA and CAD; he has never had severe hypoglycemia or DKA).  no cbg record, but states cbg's are still extremely variable.  He has hypoglycemia in the middle of almost every night.  There is no trend throughout the day.  He still takes a varying dosage of novolog.   Past Medical History  Diagnosis Date  . CORONARY ARTERY DISEASE 08/11/2006  . CVA 12/19/2006    2008  . DIABETES MELLITUS, TYPE I 08/11/2006  . ECZEMA, HANDS 12/15/2009  . HYPERLIPIDEMIA 08/11/2006  . HYPERTENSION 08/11/2006  . MYOCARDIAL INFARCTION, HX OF 08/11/2006    07/14/01    Past Surgical History  Procedure Laterality Date  . Tonsilectomy, adenoidectomy, bilateral myringotomy and tubes    . Coronary stent placement      History   Social History  . Marital Status: Married    Spouse Name: N/A    Number of Children: N/A  . Years of Education: N/A   Occupational History  . Writer   Social History Main Topics  . Smoking status: Former Smoker    Quit date: 05/03/2000  . Smokeless tobacco: Not on file  . Alcohol Use: Yes     Comment: 1-2/week  . Drug Use: Not on file  . Sexually Active: Not on file   Other Topics Concern  . Not on file   Social History Narrative   Regular exercise-no    Current Outpatient Prescriptions on File Prior to Visit  Medication Sig Dispense Refill  . ACCU-CHEK AVIVA PLUS test strip 1 each by Other route 4 (four) times daily.       [name] ACCU-CHEK AVIVA PLUS test strip CHECK BLOOD SUGAR four times a day  100 strip  11  . aspirin 325 MG tablet Take 325 mg by mouth daily.        [name] atorvastatin (LIPITOR) 40 MG tablet       . atorvastatin (LIPITOR) 40 MG tablet take 1 tablet by mouth once daily  30 tablet  3  . BD ULTRA-FINE PEN  NEEDLES 29G X 12.7MM MISC use as directed twice a day  100 each  11  . clotrimazole-betamethasone (LOTRISONE) cream Apply topically 2 (two) times daily.  45 g  0  . fluconazole (DIFLUCAN) 150 MG tablet Take 1 tablet (150 mg total) by mouth once. Once a week x 4 weeks  4 tablet  0  . ibuprofen (ADVIL,MOTRIN) 200 MG tablet Take 200 mg by mouth every 6 (six) hours as needed.        . Insulin Pen Needle (B-D ULTRAFINE III SHORT PEN) 31G X 8 MM MISC by Does not apply route 4 (four) times daily.        . nitroGLYCERIN (NITROSTAT) 0.4 MG SL tablet Place 0.4 mg under the tongue every 5 (five) minutes as needed.        . ramipril (ALTACE) 5 MG capsule take 2 capsules by mouth once daily or as directed  60 capsule  2  . Lancets (ACCU-CHEK MULTICLIX) lancets Use as instructed to check blood sugar four times daily  200 each  3   No current facility-administered medications on file prior to visit.   No Known Allergies  Family  History  Problem Relation Age of Onset  . Diabetes Neg Hx    BP 122/80  Pulse 78  Ht 5\' 9"  (1.753 m)  Wt 186 lb (84.369 kg)  BMI 27.45 kg/m2  SpO2 98%  Review of Systems Denies LOC and weight change    Objective:   Physical Exam VITAL SIGNS:  See vs page GENERAL: no distress  Lab Results  Component Value Date   HGBA1C 7.9* 07/24/2012      Assessment & Plan:  DM: he needs a simpler insulin regimen.  The benefits of glycemic control must be weighed against the risks of hypoglycemia.

## 2012-07-24 NOTE — Patient Instructions (Addendum)
check your blood sugar 4 times a day--before the 3 meals, and at bedtime.  also check if you have symptoms of your blood sugar being too high or too low.  please keep a record of the readings and bring it to your next appointment here.  please call us sooner if you are having low blood sugar episodes. Please make a follow-up appointment in 3 months.   Please change both current insulins to humalog 75/25, 25 units with breakfast, and 15 units with supper.  This is probably not enough units.  If this is the case, call and tell us what time of day the sugar is highest.  blood tests are being requested for you today.  We'll contact you with results.

## 2012-07-24 NOTE — Telephone Encounter (Signed)
Called pt and told him his results were not too bad. HgbA1C was 7.9 down from 8.2. However, please change insulins as we discussed, to get rid of the lows at night. Pt understood.

## 2012-08-11 ENCOUNTER — Encounter: Payer: Self-pay | Admitting: Internal Medicine

## 2012-08-11 ENCOUNTER — Ambulatory Visit (INDEPENDENT_AMBULATORY_CARE_PROVIDER_SITE_OTHER): Payer: 59 | Admitting: Internal Medicine

## 2012-08-11 VITALS — BP 114/82 | HR 64 | Temp 98.0°F | Wt 185.0 lb

## 2012-08-11 DIAGNOSIS — E1059 Type 1 diabetes mellitus with other circulatory complications: Secondary | ICD-10-CM

## 2012-08-11 DIAGNOSIS — Z0000 Encounter for general adult medical examination without abnormal findings: Secondary | ICD-10-CM

## 2012-08-11 LAB — BASIC METABOLIC PANEL
BUN: 18 mg/dL (ref 6–23)
CO2: 33 mEq/L — ABNORMAL HIGH (ref 19–32)
Chloride: 106 mEq/L (ref 96–112)
Glucose, Bld: 210 mg/dL — ABNORMAL HIGH (ref 70–99)
Potassium: 5.2 mEq/L — ABNORMAL HIGH (ref 3.5–5.1)

## 2012-08-11 LAB — HEPATIC FUNCTION PANEL
Albumin: 3.7 g/dL (ref 3.5–5.2)
Alkaline Phosphatase: 55 U/L (ref 39–117)

## 2012-08-11 LAB — LIPID PANEL: VLDL: 11 mg/dL (ref 0.0–40.0)

## 2012-08-11 LAB — CBC
HCT: 45.5 % (ref 39.0–52.0)
Hemoglobin: 15.4 g/dL (ref 13.0–17.0)
MCHC: 33.7 g/dL (ref 30.0–36.0)
MCV: 99.6 fl (ref 78.0–100.0)
RDW: 13.5 % (ref 11.5–14.6)

## 2012-08-11 LAB — MICROALBUMIN / CREATININE URINE RATIO
Microalb Creat Ratio: 0.1 mg/g (ref 0.0–30.0)
Microalb, Ur: 0.2 mg/dL (ref 0.0–1.9)

## 2012-08-11 LAB — TSH: TSH: 4.98 u[IU]/mL (ref 0.35–5.50)

## 2012-08-11 MED ORDER — NITROGLYCERIN 0.4 MG SL SUBL: 0.4000 mg | SUBLINGUAL_TABLET | SUBLINGUAL | Status: DC | PRN

## 2012-08-11 NOTE — Progress Notes (Signed)
Patient ID: John Carrillo, male   DOB: 26-Jun-1950, 62 y.o.   MRN: [id number]  Dm- type 1. Home cbgs- 70-220 No polyuria-  Low cbgs are much rarer on current insulin regimen.  Coronary artery disease: Patient has no symptoms.  History of a stroke: No recurrent symptoms. Patient remains active. He sails competitively.  Hypertension hyperlipidemia: Patient complaint with medications.  Reviewed past medical history, medications, social history.  Review of systems no specific complaints in a complete review of systems.  Physical exam reviewed vital signs. Chest clear auscultation. Cardiac exam S1-S2 are regular. Abdominal exam active muscles, soft. Extremities no edema. Neurologic exam is alert. He has a normal gait.

## 2012-08-12 NOTE — Assessment & Plan Note (Signed)
Will check laboratory work today. Patient needs aggressive risk factor modification for coronary artery disease and for secondary stroke prevention. Patient also has hypertension hyperlipidemia. Will check laboratory work. We'll concentrate on eating A1c to goal which is less than 7%.

## 2012-08-13 ENCOUNTER — Other Ambulatory Visit: Payer: Self-pay

## 2012-10-16 ENCOUNTER — Encounter: Payer: Self-pay | Admitting: Endocrinology

## 2012-10-16 ENCOUNTER — Ambulatory Visit (INDEPENDENT_AMBULATORY_CARE_PROVIDER_SITE_OTHER): Payer: 59 | Admitting: Endocrinology

## 2012-10-16 VITALS — BP 124/80 | HR 80 | Ht 68.0 in | Wt 187.0 lb

## 2012-10-16 DIAGNOSIS — E1059 Type 1 diabetes mellitus with other circulatory complications: Secondary | ICD-10-CM

## 2012-10-16 LAB — HEMOGLOBIN A1C: Hgb A1c MFr Bld: 9.3 % — ABNORMAL HIGH (ref 4.6–6.5)

## 2012-10-16 NOTE — Progress Notes (Signed)
Subjective:    Patient ID: John Carrillo, male    DOB: 1950-07-27, 62 y.o.   MRN: [id number]  HPI Pt returns for f/u of type 1 dm (dx'ed 2008; He has mild if any neuropathy of the lower extremities; he has associated complications of CVA, PAD, and CAD; he has never had severe hypoglycemia or DKA; he chose a bid insulin regimen).  no cbg record, but states cbg's are still extremely variable.  He has hypoglycemia approx once a week.  It happens most frequently after lunch is delayed.   It is highest in am (often over 200). Past Medical History  Diagnosis Date  . CORONARY ARTERY DISEASE 08/11/2006  . CVA 12/19/2006    2008  . DIABETES MELLITUS, TYPE I 08/11/2006  . ECZEMA, HANDS 12/15/2009  . HYPERLIPIDEMIA 08/11/2006  . HYPERTENSION 08/11/2006  . MYOCARDIAL INFARCTION, HX OF 08/11/2006    07/14/01    Past Surgical History  Procedure Laterality Date  . Tonsilectomy, adenoidectomy, bilateral myringotomy and tubes    . Coronary stent placement      History   Social History  . Marital Status: Married    Spouse Name: N/A    Number of Children: N/A  . Years of Education: N/A   Occupational History  . Writer   Social History Main Topics  . Smoking status: Former Smoker    Quit date: 05/03/2000  . Smokeless tobacco: Not on file  . Alcohol Use: Yes     Comment: 1-2/week  . Drug Use: Not on file  . Sexual Activity: Not on file   Other Topics Concern  . Not on file   Social History Narrative   Regular exercise-no    Current Outpatient Prescriptions on File Prior to Visit  Medication Sig Dispense Refill  . ACCU-CHEK AVIVA PLUS test strip CHECK BLOOD SUGAR four times a day  100 strip  11  . aspirin 325 MG tablet Take 325 mg by mouth daily.        [name] atorvastatin (LIPITOR) 20 MG tablet Take 20 mg by mouth daily.      . BD ULTRA-FINE PEN NEEDLES 29G X 12.7MM MISC use as directed twice a day  100 each  11  . ibuprofen (ADVIL,MOTRIN) 200 MG tablet Take 200  mg by mouth every 6 (six) hours as needed.        . Insulin Aspart Prot & Aspart (70-30) 100 UNIT/ML SUPN Inject 15-25 Units into the skin 2 (two) times daily. 21 units with breakfast, and 25 units with the evening meal      . Lancets (ACCU-CHEK MULTICLIX) lancets Use as instructed to check blood sugar four times daily  200 each  3  . nitroGLYCERIN (NITROSTAT) 0.4 MG SL tablet Place 1 tablet (0.4 mg total) under the tongue every 5 (five) minutes as needed.  30 tablet  5  . ramipril (ALTACE) 5 MG capsule        No current facility-administered medications on file prior to visit.   No Known Allergies  Family History  Problem Relation Age of Onset  . Diabetes Neg Hx    BP 124/80  Pulse 80  Ht 5\' 8"  (1.727 m)  Wt 187 lb (84.823 kg)  BMI 28.44 kg/m2  SpO2 98%  Review of Systems Denies LOC and weight change.     Objective:   Physical Exam VITAL SIGNS:  See vs page. GENERAL: no distress. SKIN:  Insulin injection sites at the anterior abdomen are  normal.       Assessment & Plan:  DM: This insulin regimen was chosen from multiple options, for its simplicity.  The benefits of glycemic control must be weighed against the risks of hypoglycemia.  He needs increased rx.   CAD: in this context, he should avoid hypoglycemia.

## 2012-10-16 NOTE — Patient Instructions (Addendum)
check your blood sugar 4 times a day--before the 3 meals, and at bedtime.  also check if you have symptoms of your blood sugar being too high or too low.  please keep a record of the readings and bring it to your next appointment here.  please call us sooner if you are having low blood sugar episodes. Please make a follow-up appointment in 3 months.   Please increase the insulin to 20 units with breakfast, and 30 units with the evening meal On this type of insulin schedule, you should eat meals on a regular schedule.  If a meal is missed or significantly delayed, your blood sugar could go low. blood tests are being requested for you today.  We'll contact you with results.

## 2012-11-15 ENCOUNTER — Other Ambulatory Visit: Payer: Self-pay | Admitting: Internal Medicine

## 2012-12-19 ENCOUNTER — Other Ambulatory Visit: Payer: Self-pay | Admitting: Endocrinology

## 2013-01-18 ENCOUNTER — Other Ambulatory Visit: Payer: Self-pay | Admitting: Internal Medicine

## 2013-01-22 ENCOUNTER — Ambulatory Visit (INDEPENDENT_AMBULATORY_CARE_PROVIDER_SITE_OTHER): Payer: 59 | Admitting: Endocrinology

## 2013-01-22 ENCOUNTER — Encounter: Payer: Self-pay | Admitting: Endocrinology

## 2013-01-22 VITALS — BP 118/78 | HR 58 | Temp 98.0°F | Ht 68.0 in | Wt 187.0 lb

## 2013-01-22 DIAGNOSIS — E1059 Type 1 diabetes mellitus with other circulatory complications: Secondary | ICD-10-CM

## 2013-01-22 LAB — HEMOGLOBIN A1C: Hgb A1c MFr Bld: 9.1 % — ABNORMAL HIGH (ref 4.6–6.5)

## 2013-01-22 MED ORDER — GLUCOSE BLOOD VI STRP: 1.0000 | ORAL_STRIP | Freq: Four times a day (QID) | Status: DC

## 2013-01-22 MED ORDER — ONETOUCH VERIO SYNC SYSTEM W/DEVICE KIT
1.0000 | PACK | Freq: Once | Status: DC
Start: 2013-01-22 — End: 2013-02-15

## 2013-01-22 NOTE — Patient Instructions (Addendum)
check your blood sugar 4 times a day--before the 3 meals, and at bedtime.  also check if you have symptoms of your blood sugar being too high or too low.  please keep a record of the readings and bring it to your next appointment here.  please call us sooner if you are having low blood sugar episodes. Please make a follow-up appointment in 3 months.   On this type of insulin schedule, you should eat meals on a regular schedule.  If a meal is missed or significantly delayed, your blood sugar could go low. A diabetes blood test is requested for you today.  We'll contact you with results.

## 2013-01-22 NOTE — Progress Notes (Signed)
Subjective:    Patient ID: John Carrillo, male    DOB: 09/20/1950, 62 y.o.   MRN: [id number]  HPI Pt returns for f/u of type 1 dm (dx'ed 2008, when he presented with weight loss, and glycose was 500; he has mild if any neuropathy of the lower extremities; he has associated complications of CVA, PAD, and CAD; he has been on insulin since 6 months after dx; he has never had severe hypoglycemia or DKA; he chose a simple bid insulin regimen).  no cbg record, but states cbg's are much better recently.  It is in general higher in am than later in the day.  pt states he feels well in general. Past Medical History  Diagnosis Date  . CORONARY ARTERY DISEASE 08/11/2006  . CVA 12/19/2006    2008  . DIABETES MELLITUS, TYPE I 08/11/2006  . ECZEMA, HANDS 12/15/2009  . HYPERLIPIDEMIA 08/11/2006  . HYPERTENSION 08/11/2006  . MYOCARDIAL INFARCTION, HX OF 08/11/2006    07/14/01    Past Surgical History  Procedure Laterality Date  . Tonsilectomy, adenoidectomy, bilateral myringotomy and tubes    . Coronary stent placement      History   Social History  . Marital Status: Married    Spouse Name: N/A    Number of Children: N/A  . Years of Education: N/A   Occupational History  . Writer   Social History Main Topics  . Smoking status: Former Smoker    Quit date: 05/03/2000  . Smokeless tobacco: Not on file  . Alcohol Use: Yes     Comment: 1-2/week  . Drug Use: Not on file  . Sexual Activity: Not on file   Other Topics Concern  . Not on file   Social History Narrative   Regular exercise-no    Current Outpatient Prescriptions on File Prior to Visit  Medication Sig Dispense Refill  . ACCU-CHEK AVIVA PLUS test strip CHECK BLOOD SUGAR 4 TIMES A DAY  100 each  11  . aspirin 325 MG tablet Take 325 mg by mouth daily.        [name] atorvastatin (LIPITOR) 40 MG tablet take 1 tablet by mouth once daily  30 tablet  5  . BD ULTRA-FINE PEN NEEDLES 29G X 12.7MM MISC use as  directed twice a day  100 each  11  . ibuprofen (ADVIL,MOTRIN) 200 MG tablet Take 200 mg by mouth every 6 (six) hours as needed.        . Insulin Aspart Prot & Aspart (70-30) 100 UNIT/ML SUPN Inject into the skin 2 (two) times daily. 20 units with breakfast, and 40 units with the evening meal      . Lancets (ACCU-CHEK MULTICLIX) lancets Use as instructed to check blood sugar four times daily  200 each  3  . nitroGLYCERIN (NITROSTAT) 0.4 MG SL tablet Place 1 tablet (0.4 mg total) under the tongue every 5 (five) minutes as needed.  30 tablet  5  . ramipril (ALTACE) 5 MG capsule take 2 capsules by mouth once daily or as directed  60 capsule  2   No current facility-administered medications on file prior to visit.    No Known Allergies  Family History  Problem Relation Age of Onset  . Diabetes Neg Hx     BP 118/78  Pulse 58  Temp(Src) 98 F (36.7 C) (Oral)  Ht 5\' 8"  (1.727 m)  Wt 187 lb (84.823 kg)  BMI 28.44 kg/m2  SpO2 97%  Review  of Systems denies hypoglycemia and weight change.    Objective:   Physical Exam VITAL SIGNS:  See vs page GENERAL: no distress   Lab Results  Component Value Date   HGBA1C 9.1* 01/22/2013      Assessment & Plan:  DM: This insulin regimen was chosen from multiple options, for its simplicity.  The benefits of glycemic control must be weighed against the risks of hypoglycemia.  He needs increased rx.   CAD: in this context, he should avoid hypoglycemia.

## 2013-02-15 ENCOUNTER — Other Ambulatory Visit: Payer: Self-pay

## 2013-02-15 MED ORDER — ONETOUCH VERIO SYNC SYSTEM W/DEVICE KIT: 1.0000 | PACK | Freq: Once | Status: DC

## 2013-02-15 MED ORDER — GLUCOSE BLOOD VI STRP: 1.0000 | ORAL_STRIP | Freq: Four times a day (QID) | Status: DC

## 2013-02-15 NOTE — Telephone Encounter (Signed)
Pt called stating that insurance would no longer be covering Accu-Check. Insurance will cover One touch. Scripts sent in for One touch monitor and test strips.

## 2013-04-23 ENCOUNTER — Ambulatory Visit: Payer: 59 | Admitting: Endocrinology

## 2013-04-30 ENCOUNTER — Encounter: Payer: Self-pay | Admitting: Endocrinology

## 2013-04-30 ENCOUNTER — Ambulatory Visit (INDEPENDENT_AMBULATORY_CARE_PROVIDER_SITE_OTHER): Payer: 59 | Admitting: Endocrinology

## 2013-04-30 VITALS — BP 124/82 | HR 70 | Temp 97.9°F | Ht 68.0 in | Wt 192.0 lb

## 2013-04-30 DIAGNOSIS — E1059 Type 1 diabetes mellitus with other circulatory complications: Secondary | ICD-10-CM

## 2013-04-30 LAB — HEMOGLOBIN A1C: Hgb A1c MFr Bld: 8.4 % — ABNORMAL HIGH (ref 4.6–6.5)

## 2013-04-30 NOTE — Progress Notes (Signed)
Subjective:    Patient ID: John Carrillo, male    DOB: [birth year], 63 y.o.   MRN: [id number]  HPI Pt returns for f/u of type 1 dm (dx'ed 2008, when he presented with weight loss, and glycose was 500; he has mild if any neuropathy of the lower extremities; he has associated complications of CVA, PAD, and CAD; he has been on insulin since 6 months after dx; he has never had severe hypoglycemia or DKA; he chose a simple bid insulin regimen).  he brings a record of his cbg's, on his phone-app, which i have reviewed today.  It varies from 55-300.  It is in general highest in the afternoon.  However, this depends heavily on his activity level.   Past Medical History  Diagnosis Date  . CORONARY ARTERY DISEASE 08/11/2006  . CVA 12/19/2006    2008  . DIABETES MELLITUS, TYPE I 08/11/2006  . ECZEMA, HANDS 12/15/2009  . HYPERLIPIDEMIA 08/11/2006  . HYPERTENSION 08/11/2006  . MYOCARDIAL INFARCTION, HX OF 08/11/2006    07/14/01    Past Surgical History  Procedure Laterality Date  . Tonsilectomy, adenoidectomy, bilateral myringotomy and tubes    . Coronary stent placement      History   Social History  . Marital Status: Married    Spouse Name: N/A    Number of Children: N/A  . Years of Education: N/A   Occupational History  . Occupational hygienist   Social History Main Topics  . Smoking status: Former Smoker    Quit date: 05/03/2000  . Smokeless tobacco: Not on file  . Alcohol Use: Yes     Comment: 1-2/week  . Drug Use: Not on file  . Sexual Activity: Not on file   Other Topics Concern  . Not on file   Social History Narrative   Regular exercise-no    Current Outpatient Prescriptions on File Prior to Visit  Medication Sig Dispense Refill  . aspirin 325 MG tablet Take 325 mg by mouth daily.        [name] atorvastatin (LIPITOR) 40 MG tablet take 1 tablet by mouth once daily  30 tablet  5  . BD ULTRA-FINE PEN NEEDLES 29G X 12.7MM MISC use as directed twice a day  100 each   11  . Blood Glucose Monitoring Suppl (ONETOUCH VERIO [facility] SYSTEM) W/DEVICE KIT 1 Device by Does not apply route once.  1 kit  0  . glucose blood (ONETOUCH VERIO) test strip 1 each by Other route 4 (four) times daily. And lances 4/day 250.01  120 each  12  . ibuprofen (ADVIL,MOTRIN) 200 MG tablet Take 200 mg by mouth every 6 (six) hours as needed.        . Insulin Aspart Prot & Aspart (70-30) 100 UNIT/ML SUPN Inject into the skin 2 (two) times daily. 25 units with breakfast, and 40 units with the evening meal      . Lancets (ACCU-CHEK MULTICLIX) lancets Use as instructed to check blood sugar four times daily  200 each  3  . nitroGLYCERIN (NITROSTAT) 0.4 MG SL tablet Place 1 tablet (0.4 mg total) under the tongue every 5 (five) minutes as needed.  30 tablet  5  . ramipril (ALTACE) 5 MG capsule take 2 capsules by mouth once daily or as directed  60 capsule  2   No current facility-administered medications on file prior to visit.    No Known Allergies  Family History  Problem Relation Age of Onset  .  Diabetes Neg Hx     BP 124/82  Pulse 70  Temp(Src) 97.9 F (36.6 C) (Oral)  Ht _0  (1.727 m)  Wt 192 lb (87.091 kg)  BMI 29.20 kg/m2  SpO2 97%  Review of Systems He has mild hypoglycemia approx twice a month, usually with activity.  Denies weight change    Objective:   Physical Exam VITAL SIGNS:  See vs page GENERAL: no distress.      Assessment & Plan:  DM: This insulin regimen was chosen from multiple options, for its simplicity.  The benefits of glycemic control must be weighed against the risks of hypoglycemia.  He needs increased rx.   CAD: in this context, he should avoid hypoglycemia.

## 2013-04-30 NOTE — Patient Instructions (Addendum)
check your blood sugar 4 times a day--before the 3 meals, and at bedtime.  also check if you have symptoms of your blood sugar being too high or too low.  please keep a record of the readings and bring it to your next appointment here.  please call us sooner if you are having low blood sugar episodes. Please make a follow-up appointment in 3 months.   On this type of insulin schedule, you should eat meals on a regular schedule.  If a meal is missed or significantly delayed, your blood sugar could go low. A diabetes blood test is requested for you today.  We'll contact you with results.   Please change the morning insulin to 25 units (but only 15-20 units if you are going to be active).

## 2013-07-02 ENCOUNTER — Other Ambulatory Visit: Payer: Self-pay

## 2013-07-02 MED ORDER — INSULIN ASPART PROT & ASPART (70-30 MIX) 100 UNIT/ML PEN: PEN_INJECTOR | SUBCUTANEOUS | Status: DC

## 2013-08-19 ENCOUNTER — Other Ambulatory Visit: Payer: Self-pay | Admitting: Internal Medicine

## 2013-08-20 ENCOUNTER — Ambulatory Visit (INDEPENDENT_AMBULATORY_CARE_PROVIDER_SITE_OTHER): Payer: 59 | Admitting: Endocrinology

## 2013-08-20 ENCOUNTER — Encounter: Payer: Self-pay | Admitting: Endocrinology

## 2013-08-20 VITALS — BP 122/84 | HR 67 | Temp 97.9°F | Ht 68.0 in | Wt 189.0 lb

## 2013-08-20 DIAGNOSIS — E1059 Type 1 diabetes mellitus with other circulatory complications: Secondary | ICD-10-CM

## 2013-08-20 LAB — HEMOGLOBIN A1C: Hgb A1c MFr Bld: 8.9 % — ABNORMAL HIGH (ref 4.6–6.5)

## 2013-08-20 LAB — MICROALBUMIN / CREATININE URINE RATIO
CREATININE, U: 107.7 mg/dL
Microalb Creat Ratio: 0.1 mg/g (ref 0.0–30.0)
Microalb, Ur: 0.1 mg/dL (ref 0.0–1.9)

## 2013-08-20 NOTE — Patient Instructions (Addendum)
check your blood sugar 4 times a day--before the 3 meals, and at bedtime.  also check if you have symptoms of your blood sugar being too high or too low.  please keep a record of the readings and bring it to your next appointment here.  please call us sooner if you are having low blood sugar episodes. Please make a follow-up appointment in 3 months.   On this type of insulin schedule, you should eat meals on a regular schedule.  If a meal is missed or significantly delayed, your blood sugar could go low.   A diabetes blood test is requested for you today.  We'll contact you with results.   Please change the insulin to 20 units with breakfast (but only 15-20 units if you are going to be active), and 35 units with the evening meal.

## 2013-08-20 NOTE — Progress Notes (Signed)
Subjective:    Patient ID: John Carrillo, male    DOB: May 14, 1950, 63 y.o.   MRN: [id number]  HPI Pt returns for f/u of type 1 dm (dx'ed 2008, when he presented with weight loss, and glycose was 500; he has mild if any neuropathy of the lower extremities; he has associated complications of CVA, PAD, and CAD; he has been on insulin since 6 months after dx; he has never had severe hypoglycemia, pancreatitis, or DKA; he chose a simple bid insulin regimen).  no cbg record, but states he says he has hypoglycemia during the day after taking 25 units with breakfast, even if he is not active.  If he takes 40 units in the evening, he has hypoglycemia in the middle of the night.   Past Medical History  Diagnosis Date  . CORONARY ARTERY DISEASE 08/11/2006  . CVA 12/19/2006    2008  . DIABETES MELLITUS, TYPE I 08/11/2006  . ECZEMA, HANDS 12/15/2009  . HYPERLIPIDEMIA 08/11/2006  . HYPERTENSION 08/11/2006  . MYOCARDIAL INFARCTION, HX OF 08/11/2006    07/14/01    Past Surgical History  Procedure Laterality Date  . Tonsilectomy, adenoidectomy, bilateral myringotomy and tubes    . Coronary stent placement      History   Social History  . Marital Status: Married    Spouse Name: N/A    Number of Children: N/A  . Years of Education: N/A   Occupational History  . Occupational hygienist   Social History Main Topics  . Smoking status: Former Smoker    Quit date: 05/03/2000  . Smokeless tobacco: Not on file  . Alcohol Use: Yes     Comment: 1-2/week  . Drug Use: Not on file  . Sexual Activity: Not on file   Other Topics Concern  . Not on file   Social History Narrative   Regular exercise-no    Current Outpatient Prescriptions on File Prior to Visit  Medication Sig Dispense Refill  . aspirin 325 MG tablet Take 325 mg by mouth daily.        [name] atorvastatin (LIPITOR) 40 MG tablet take 1 tablet by mouth once daily  30 tablet  5  . BD ULTRA-FINE PEN NEEDLES 29G X 12.7MM MISC use as  directed twice a day  100 each  11  . Blood Glucose Monitoring Suppl (ONETOUCH VERIO [facility] SYSTEM) W/DEVICE KIT 1 Device by Does not apply route once.  1 kit  0  . glucose blood (ONETOUCH VERIO) test strip 1 each by Other route 4 (four) times daily. And lances 4/day 250.01  120 each  12  . ibuprofen (ADVIL,MOTRIN) 200 MG tablet Take 200 mg by mouth every 6 (six) hours as needed.        . nitroGLYCERIN (NITROSTAT) 0.4 MG SL tablet Place 1 tablet (0.4 mg total) under the tongue every 5 (five) minutes as needed.  30 tablet  5  . ramipril (ALTACE) 5 MG capsule take 2 capsules by mouth once daily as directed  60 capsule  1  . Lancets (ACCU-CHEK MULTICLIX) lancets Use as instructed to check blood sugar four times daily  200 each  3   No current facility-administered medications on file prior to visit.    No Known Allergies  Family History  Problem Relation Age of Onset  . Diabetes Neg Hx     BP 122/84  Pulse 67  Temp(Src) 97.9 F (36.6 C) (Oral)  Ht 5' 8"  (1.727 m)  Wt 189  lb (85.73 kg)  BMI 28.74 kg/m2  SpO2 97%  Review of Systems Denies LOC and weight change.      Objective:   Physical Exam VITAL SIGNS:  See vs page GENERAL: no distress Pulses: dorsalis pedis intact bilat.   Feet: no deformity. normal color and temp.  no edema.   Skin:  no ulcer on the feet.   Neuro: sensation is intact to touch on the feet.    Lab Results  Component Value Date   HGBA1C 8.9* 08/20/2013      Assessment & Plan:  DM: moderate exacerbation. Side-effect of medication: hypoglycemia: this is limiting insulin rx.   Noncompliance with cbg recording: I'll work around this as best I can.     Patient is advised the following: Patient Instructions  check your blood sugar 4 times a day--before the 3 meals, and at bedtime.  also check if you have symptoms of your blood sugar being too high or too low.  please keep a record of the readings and bring it to your next appointment here.  please call us  sooner if you are having low blood sugar episodes. Please make a follow-up appointment in 3 months.   On this type of insulin schedule, you should eat meals on a regular schedule.  If a meal is missed or significantly delayed, your blood sugar could go low.   A diabetes blood test is requested for you today.  We'll contact you with results.   Please change the insulin to 20 units with breakfast (but only 15-20 units if you are going to be active), and 35 units with the evening meal.

## 2013-09-02 ENCOUNTER — Telehealth: Payer: Self-pay | Admitting: Endocrinology

## 2013-09-02 NOTE — Telephone Encounter (Signed)
Rather than change to nph he would rather to vasal and bollous

## 2013-09-03 MED ORDER — INSULIN PEN NEEDLE 29G X 12.7MM MISC: Status: DC

## 2013-09-03 NOTE — Telephone Encounter (Signed)
Called pt. He states that he would prefer to go back to basal and bolus as he was before. Pt informed that Dr. Everardo AllEllison is out of the office and would be back on 09/13/2013. Pt states that he will wait until MD returns to address changes.

## 2013-10-20 ENCOUNTER — Other Ambulatory Visit: Payer: Self-pay

## 2013-10-20 MED ORDER — INSULIN ASPART PROT & ASPART (70-30 MIX) 100 UNIT/ML PEN: PEN_INJECTOR | SUBCUTANEOUS | Status: DC

## 2013-11-26 ENCOUNTER — Encounter: Payer: Self-pay | Admitting: Endocrinology

## 2013-11-26 ENCOUNTER — Ambulatory Visit (INDEPENDENT_AMBULATORY_CARE_PROVIDER_SITE_OTHER): Payer: 59 | Admitting: Endocrinology

## 2013-11-26 VITALS — BP 126/84 | HR 72 | Temp 97.5°F | Wt 183.0 lb

## 2013-11-26 DIAGNOSIS — E1059 Type 1 diabetes mellitus with other circulatory complications: Secondary | ICD-10-CM

## 2013-11-26 DIAGNOSIS — IMO0002 Reserved for concepts with insufficient information to code with codable children: Secondary | ICD-10-CM

## 2013-11-26 DIAGNOSIS — E1051 Type 1 diabetes mellitus with diabetic peripheral angiopathy without gangrene: Secondary | ICD-10-CM

## 2013-11-26 DIAGNOSIS — E1065 Type 1 diabetes mellitus with hyperglycemia: Principal | ICD-10-CM

## 2013-11-26 LAB — LIPID PANEL
Cholesterol: 172 mg/dL (ref 0–200)
HDL: 58 mg/dL (ref >39)
LDL Cholesterol: 102 mg/dL — ABNORMAL HIGH (ref 0–99)
TRIGLYCERIDES: 60 mg/dL (ref <150)
Total CHOL/HDL Ratio: 3 Ratio
VLDL: 12 mg/dL (ref 0–40)

## 2013-11-26 LAB — BASIC METABOLIC PANEL
BUN: 23 mg/dL (ref 6–23)
CO2: 30 meq/L (ref 19–32)
CREATININE: 1.2 mg/dL (ref 0.50–1.35)
Calcium: 9.1 mg/dL (ref 8.4–10.5)
Chloride: 106 mEq/L (ref 96–112)
Glucose, Bld: 64 mg/dL — ABNORMAL LOW (ref 70–99)
Potassium: 5.2 mEq/L (ref 3.5–5.3)
Sodium: 144 mEq/L (ref 135–145)

## 2013-11-26 MED ORDER — INSULIN DETEMIR 100 UNIT/ML FLEXPEN: 20.0000 [IU] | PEN_INJECTOR | Freq: Every day | SUBCUTANEOUS | Status: DC

## 2013-11-26 MED ORDER — INSULIN ASPART 100 UNIT/ML FLEXPEN
PEN_INJECTOR | SUBCUTANEOUS | Status: DC
Start: 2013-11-26 — End: 2013-11-30

## 2013-11-26 NOTE — Progress Notes (Signed)
Subjective:    Patient ID: John Carrillo, male    DOB: 07-Jun-1950, 63 y.o.   MRN: [id number]  HPI Pt returns for f/u of diabetes mellitus: DM type: 1 Dx'ed: 2008. Complications: CVA, PAD, and CAD. Therapy: insulin since 6 months after dx. DKA: never Severe hypoglycemia: never Pancreatitis: never Other: he declines pump Interval history: he brings a record of his cbg's which i have reviewed today, on his phone.  He now takes the novolog 70/30, bid.  He has mild hypoglycemia almost daily, at any time of day.  He says he never misses the insulin.  He wants to go back to multiple daily injections.   Past Medical History  Diagnosis Date  . CORONARY ARTERY DISEASE 08/11/2006  . CVA 12/19/2006    2008  . DIABETES MELLITUS, TYPE I 08/11/2006  . ECZEMA, HANDS 12/15/2009  . HYPERLIPIDEMIA 08/11/2006  . HYPERTENSION 08/11/2006  . MYOCARDIAL INFARCTION, HX OF 08/11/2006    07/14/01    Past Surgical History  Procedure Laterality Date  . Tonsilectomy, adenoidectomy, bilateral myringotomy and tubes    . Coronary stent placement      History   Social History  . Marital Status: Married    Spouse Name: N/A    Number of Children: N/A  . Years of Education: N/A   Occupational History  . Occupational hygienist   Social History Main Topics  . Smoking status: Former Smoker    Quit date: 05/03/2000  . Smokeless tobacco: Not on file  . Alcohol Use: Yes     Comment: 1-2/week  . Drug Use: Not on file  . Sexual Activity: Not on file   Other Topics Concern  . Not on file   Social History Narrative   Regular exercise-no    Current Outpatient Prescriptions on File Prior to Visit  Medication Sig Dispense Refill  . aspirin 325 MG tablet Take 325 mg by mouth daily.        [name] atorvastatin (LIPITOR) 40 MG tablet take 1 tablet by mouth once daily  30 tablet  5  . Blood Glucose Monitoring Suppl (ONETOUCH VERIO [facility] SYSTEM) W/DEVICE KIT 1 Device by Does not apply route once.  1 kit   0  . glucose blood (ONETOUCH VERIO) test strip 1 each by Other route 4 (four) times daily. And lances 4/day 250.01  120 each  12  . ibuprofen (ADVIL,MOTRIN) 200 MG tablet Take 200 mg by mouth every 6 (six) hours as needed.        . Insulin Pen Needle (BD ULTRA-FINE PEN NEEDLES) 29G X 12.7MM MISC use as directed twice a day  200 each  2  . nitroGLYCERIN (NITROSTAT) 0.4 MG SL tablet Place 1 tablet (0.4 mg total) under the tongue every 5 (five) minutes as needed.  30 tablet  5  . ramipril (ALTACE) 5 MG capsule take 2 capsules by mouth once daily as directed  60 capsule  1  . Lancets (ACCU-CHEK MULTICLIX) lancets Use as instructed to check blood sugar four times daily  200 each  3   No current facility-administered medications on file prior to visit.    No Known Allergies  Family History  Problem Relation Age of Onset  . Diabetes Neg Hx     BP 126/84  Pulse 72  Temp(Src) 97.5 F (36.4 C) (Oral)  Wt 183 lb (83.008 kg)  SpO2 95%  Review of Systems Denies LOC and n/v.  He has lost a few lbs.  Objective:   Physical Exam VITAL SIGNS:  See vs page GENERAL: no distress Pulses: dorsalis pedis intact bilat.   Feet: no deformity.  no edema Skin:  no ulcer on the feet.  normal color and temp. Neuro: sensation is intact to touch on the feet.   Lab Results  Component Value Date   HGBA1C 7.0* 11/26/2013       Assessment & Plan:  DM: control is much better Side-effect of rx: hypoglycemia: if he chooses to continue bid insulin, he should decrease the dosage. Weight loss: this is also contributing to improved glycemic control.    Patient is advised the following: Patient Instructions  check your blood sugar 4 times a day--before the 3 meals, and at bedtime.  also check if you have symptoms of your blood sugar being too high or too low.  please keep a record of the readings and bring it to your next appointment here.  please call us sooner if you are having low blood sugar  episodes. Please make a follow-up appointment in 3 months.   A diabetes blood test is requested for you today.  We'll contact you with results.   Please change the insulin back to:  Novolog: 20 units with breakfast 20 units with lunch, and 35 units with the evening meal, and: Levemir, 20 units at bedtime Please continue your weight-loss efforts. As you have had heart problems, you should avoid low-blood sugar.

## 2013-11-26 NOTE — Patient Instructions (Addendum)
check your blood sugar 4 times a day--before the 3 meals, and at bedtime.  also check if you have symptoms of your blood sugar being too high or too low.  please keep a record of the readings and bring it to your next appointment here.  please call us sooner if you are having low blood sugar episodes. Please make a follow-up appointment in 3 months.   A diabetes blood test is requested for you today.  We'll contact you with results.   Please change the insulin back to:  Novolog: 20 units with breakfast 20 units with lunch, and 35 units with the evening meal, and: Levemir, 20 units at bedtime Please continue your weight-loss efforts. As you have had heart problems, you should avoid low-blood sugar.

## 2013-11-27 LAB — HEMOGLOBIN A1C
Hgb A1c MFr Bld: 7 % — ABNORMAL HIGH (ref <5.7)
MEAN PLASMA GLUCOSE: 154 mg/dL — AB (ref <117)

## 2013-11-30 ENCOUNTER — Other Ambulatory Visit: Payer: Self-pay | Admitting: Endocrinology

## 2013-11-30 MED ORDER — INSULIN ASPART 100 UNIT/ML FLEXPEN: PEN_INJECTOR | SUBCUTANEOUS | Status: DC

## 2013-11-30 MED ORDER — INSULIN DETEMIR 100 UNIT/ML FLEXPEN: 20.0000 [IU] | PEN_INJECTOR | Freq: Every day | SUBCUTANEOUS | Status: DC

## 2013-12-16 ENCOUNTER — Other Ambulatory Visit: Payer: Self-pay | Admitting: Family

## 2014-01-07 ENCOUNTER — Other Ambulatory Visit: Payer: Self-pay | Admitting: Family

## 2014-03-02 ENCOUNTER — Other Ambulatory Visit: Payer: Self-pay

## 2014-03-02 MED ORDER — INSULIN ASPART 100 UNIT/ML FLEXPEN: PEN_INJECTOR | SUBCUTANEOUS | Status: DC

## 2014-03-04 ENCOUNTER — Ambulatory Visit (INDEPENDENT_AMBULATORY_CARE_PROVIDER_SITE_OTHER): Payer: 59 | Admitting: Endocrinology

## 2014-03-04 ENCOUNTER — Encounter: Payer: Self-pay | Admitting: Endocrinology

## 2014-03-04 VITALS — BP 128/82 | HR 67 | Temp 98.7°F | Ht 68.0 in | Wt 186.0 lb

## 2014-03-04 DIAGNOSIS — E1059 Type 1 diabetes mellitus with other circulatory complications: Secondary | ICD-10-CM

## 2014-03-04 DIAGNOSIS — E1051 Type 1 diabetes mellitus with diabetic peripheral angiopathy without gangrene: Secondary | ICD-10-CM

## 2014-03-04 DIAGNOSIS — IMO0002 Reserved for concepts with insufficient information to code with codable children: Secondary | ICD-10-CM

## 2014-03-04 DIAGNOSIS — E1065 Type 1 diabetes mellitus with hyperglycemia: Principal | ICD-10-CM

## 2014-03-04 LAB — HEMOGLOBIN A1C: HEMOGLOBIN A1C: 7.7 % — AB (ref 4.6–6.5)

## 2014-03-04 MED ORDER — INSULIN ASPART PROT & ASPART (70-30 MIX) 100 UNIT/ML PEN: PEN_INJECTOR | SUBCUTANEOUS | Status: DC

## 2014-03-04 NOTE — Progress Notes (Signed)
Subjective:    Patient ID: John Carrillo, male    DOB: 02/06/1950, 64 y.o.   MRN: [id number]  HPI Pt returns for f/u of diabetes mellitus: DM type: 1 Dx'ed: 2008. Complications: CVA, PAD, and CAD. Therapy: insulin since 6 months after dx. DKA: never Severe hypoglycemia: never Pancreatitis: never Other: he declines pump Interval history: no cbg record, but states cbg's are well-controlled.  He has hypoglycemia approx once per month.  This usually happens at lunch.  It is highest at hs. He has gone back to the 70/30, and wishes to continue this.   Past Medical History  Diagnosis Date  . CORONARY ARTERY DISEASE 08/11/2006  . CVA 12/19/2006    2008  . DIABETES MELLITUS, TYPE I 08/11/2006  . ECZEMA, HANDS 12/15/2009  . HYPERLIPIDEMIA 08/11/2006  . HYPERTENSION 08/11/2006  . MYOCARDIAL INFARCTION, HX OF 08/11/2006    07/14/01    Past Surgical History  Procedure Laterality Date  . Tonsilectomy, adenoidectomy, bilateral myringotomy and tubes    . Coronary stent placement      History   Social History  . Marital Status: Married    Spouse Name: N/A    Number of Children: N/A  . Years of Education: N/A   Occupational History  . Occupational hygienist   Social History Main Topics  . Smoking status: Former Smoker    Quit date: 05/03/2000  . Smokeless tobacco: Not on file  . Alcohol Use: Yes     Comment: 1-2/week  . Drug Use: Not on file  . Sexual Activity: Not on file   Other Topics Concern  . Not on file   Social History Narrative   Regular exercise-no    Current Outpatient Prescriptions on File Prior to Visit  Medication Sig Dispense Refill  . aspirin 325 MG tablet Take 325 mg by mouth daily.      [name] atorvastatin (LIPITOR) 40 MG tablet take 1 tablet by mouth once daily 30 tablet 5  . Blood Glucose Monitoring Suppl (ONETOUCH VERIO [facility] SYSTEM) W/DEVICE KIT 1 Device by Does not apply route once. 1 kit 0  . glucose blood (ONETOUCH VERIO) test strip 1 each by  Other route 4 (four) times daily. And lances 4/day 250.01 120 each 12  . ibuprofen (ADVIL,MOTRIN) 200 MG tablet Take 200 mg by mouth every 6 (six) hours as needed.      . Insulin Pen Needle (BD ULTRA-FINE PEN NEEDLES) 29G X 12.7MM MISC use as directed twice a day 200 each 2  . nitroGLYCERIN (NITROSTAT) 0.4 MG SL tablet Place 1 tablet (0.4 mg total) under the tongue every 5 (five) minutes as needed. 30 tablet 5  . ramipril (ALTACE) 5 MG capsule take 2 capsule by mouth once daily as directed 60 capsule 0  . Lancets (ACCU-CHEK MULTICLIX) lancets Use as instructed to check blood sugar four times daily 200 each 3   No current facility-administered medications on file prior to visit.    No Known Allergies  Family History  Problem Relation Age of Onset  . Diabetes Neg Hx     BP 128/82 mmHg  Pulse 67  Temp(Src) 98.7 F (37.1 C) (Oral)  Ht _0  (1.727 m)  Wt 186 lb (84.369 kg)  BMI 28.29 kg/m2  SpO2 97%  Review of Systems He denies LOC and weight change    Objective:   Physical Exam VITAL SIGNS:  See vs page GENERAL: no distress Pulses: dorsalis pedis intact bilat.   MSK: no  deformity of the feet CV: no leg edema Skin:  no ulcer on the feet.  normal color and temp on the feet. Neuro: sensation is intact to touch on the feet.    Lab Results  Component Value Date   HGBA1C 7.7* 03/04/2014      Assessment & Plan:  DM: this is the best control this pt should aim for, given this regimen, which does match insulin to her changing needs throughout the day.   Patient is advised the following: Patient Instructions  check your blood sugar 4 times a day--before the 3 meals, and at bedtime.  also check if you have symptoms of your blood sugar being too high or too low.  please keep a record of the readings and bring it to your next appointment here.  please call us sooner if you are having low blood sugar episodes. Please make a follow-up appointment in 4 months.   A diabetes blood test  is requested for you today.  We'll contact you with results.

## 2014-03-04 NOTE — Patient Instructions (Addendum)
check your blood sugar 4 times a day--before the 3 meals, and at bedtime.  also check if you have symptoms of your blood sugar being too high or too low.  please keep a record of the readings and bring it to your next appointment here.  please call us sooner if you are having low blood sugar episodes. Please make a follow-up appointment in 4 months.   A diabetes blood test is requested for you today.  We'll contact you with results.

## 2014-03-11 ENCOUNTER — Telehealth: Payer: Self-pay

## 2014-03-11 ENCOUNTER — Other Ambulatory Visit: Payer: Self-pay

## 2014-03-11 MED ORDER — GLUCOSE BLOOD VI STRP: 1.0000 | ORAL_STRIP | Freq: Four times a day (QID) | Status: DC

## 2014-03-11 NOTE — Telephone Encounter (Signed)
Pt hasnt been on this med since 07/15. Is this ok to refill?

## 2014-03-11 NOTE — Telephone Encounter (Signed)
Rx request for ramipril 5 mg capusle-Take 2 capsule by mouth once daily as directed #60  Pharm:  [facility]  Pls advise.  Pt has upcoming establish appt on 5.20.2016

## 2014-03-11 NOTE — Telephone Encounter (Signed)
Yes that's fine 

## 2014-03-14 MED ORDER — RAMIPRIL 5 MG PO CAPS: 5.0000 mg | ORAL_CAPSULE | Freq: Two times a day (BID) | ORAL | Status: DC

## 2014-03-14 NOTE — Telephone Encounter (Signed)
Medication refilled

## 2014-06-23 ENCOUNTER — Encounter: Payer: Self-pay | Admitting: Family Medicine

## 2014-06-24 ENCOUNTER — Ambulatory Visit (INDEPENDENT_AMBULATORY_CARE_PROVIDER_SITE_OTHER): Payer: 59 | Admitting: Endocrinology

## 2014-06-24 ENCOUNTER — Ambulatory Visit (INDEPENDENT_AMBULATORY_CARE_PROVIDER_SITE_OTHER): Payer: 59 | Admitting: Family Medicine

## 2014-06-24 ENCOUNTER — Encounter: Payer: Self-pay | Admitting: Family Medicine

## 2014-06-24 ENCOUNTER — Encounter: Payer: Self-pay | Admitting: Endocrinology

## 2014-06-24 VITALS — BP 132/88 | HR 68 | Temp 97.6°F | Wt 185.0 lb

## 2014-06-24 VITALS — BP 134/88 | HR 77 | Ht 68.0 in | Wt 183.0 lb

## 2014-06-24 DIAGNOSIS — I10 Essential (primary) hypertension: Secondary | ICD-10-CM | POA: Diagnosis not present

## 2014-06-24 DIAGNOSIS — E1059 Type 1 diabetes mellitus with other circulatory complications: Secondary | ICD-10-CM | POA: Diagnosis not present

## 2014-06-24 DIAGNOSIS — E1051 Type 1 diabetes mellitus with diabetic peripheral angiopathy without gangrene: Secondary | ICD-10-CM

## 2014-06-24 DIAGNOSIS — I2510 Atherosclerotic heart disease of native coronary artery without angina pectoris: Secondary | ICD-10-CM | POA: Diagnosis not present

## 2014-06-24 DIAGNOSIS — I639 Cerebral infarction, unspecified: Secondary | ICD-10-CM | POA: Diagnosis not present

## 2014-06-24 DIAGNOSIS — Z1211 Encounter for screening for malignant neoplasm of colon: Secondary | ICD-10-CM

## 2014-06-24 DIAGNOSIS — E785 Hyperlipidemia, unspecified: Secondary | ICD-10-CM

## 2014-06-24 DIAGNOSIS — Z23 Encounter for immunization: Secondary | ICD-10-CM | POA: Diagnosis not present

## 2014-06-24 DIAGNOSIS — IMO0002 Reserved for concepts with insufficient information to code with codable children: Secondary | ICD-10-CM

## 2014-06-24 DIAGNOSIS — Z87891 Personal history of nicotine dependence: Secondary | ICD-10-CM | POA: Insufficient documentation

## 2014-06-24 DIAGNOSIS — E1065 Type 1 diabetes mellitus with hyperglycemia: Secondary | ICD-10-CM

## 2014-06-24 LAB — LIPID PANEL
CHOL/HDL RATIO: 4
Cholesterol: 252 mg/dL — ABNORMAL HIGH (ref 0–200)
HDL: 61.7 mg/dL (ref >39.00)
LDL Cholesterol: 172 mg/dL — ABNORMAL HIGH (ref 0–99)
NONHDL: 190.3
Triglycerides: 94 mg/dL (ref 0.0–149.0)
VLDL: 18.8 mg/dL (ref 0.0–40.0)

## 2014-06-24 LAB — CBC
HCT: 45.8 % (ref 39.0–52.0)
Hemoglobin: 15.6 g/dL (ref 13.0–17.0)
MCHC: 34 g/dL (ref 30.0–36.0)
MCV: 96.7 fl (ref 78.0–100.0)
Platelets: 254 K/uL (ref 150.0–400.0)
RBC: 4.74 Mil/uL (ref 4.22–5.81)
RDW: 13.9 % (ref 11.5–15.5)
WBC: 8.7 K/uL (ref 4.0–10.5)

## 2014-06-24 LAB — COMPREHENSIVE METABOLIC PANEL WITH GFR
ALT: 19 U/L (ref 0–53)
AST: 19 U/L (ref 0–37)
Albumin: 4 g/dL (ref 3.5–5.2)
Alkaline Phosphatase: 67 U/L (ref 39–117)
BUN: 22 mg/dL (ref 6–23)
CO2: 31 meq/L (ref 19–32)
Calcium: 9.8 mg/dL (ref 8.4–10.5)
Chloride: 107 meq/L (ref 96–112)
Creatinine, Ser: 1.26 mg/dL (ref 0.40–1.50)
GFR: 61.47 mL/min
Glucose, Bld: 92 mg/dL (ref 70–99)
Potassium: 4.8 meq/L (ref 3.5–5.1)
Sodium: 144 meq/L (ref 135–145)
Total Bilirubin: 0.6 mg/dL (ref 0.2–1.2)
Total Protein: 7 g/dL (ref 6.0–8.3)

## 2014-06-24 LAB — TSH: TSH: 7.3 u[IU]/mL — ABNORMAL HIGH (ref 0.35–4.50)

## 2014-06-24 LAB — HEMOGLOBIN A1C: Hgb A1c MFr Bld: 7.6 % — ABNORMAL HIGH (ref 4.6–6.5)

## 2014-06-24 MED ORDER — NITROGLYCERIN 0.4 MG SL SUBL: 0.4000 mg | SUBLINGUAL_TABLET | SUBLINGUAL | Status: DC | PRN

## 2014-06-24 MED ORDER — TRIAMCINOLONE ACETONIDE 0.1 % EX CREA: 1.0000 "application " | TOPICAL_CREAM | Freq: Two times a day (BID) | CUTANEOUS | Status: DC

## 2014-06-24 MED ORDER — RAMIPRIL 5 MG PO CAPS: 5.0000 mg | ORAL_CAPSULE | Freq: Two times a day (BID) | ORAL | Status: DC

## 2014-06-24 MED ORDER — ATORVASTATIN CALCIUM 40 MG PO TABS: 40.0000 mg | ORAL_TABLET | Freq: Every day | ORAL | Status: DC

## 2014-06-24 NOTE — Patient Instructions (Addendum)
check your blood sugar 4 times a day--before the 3 meals, and at bedtime.  also check if you have symptoms of your blood sugar being too high or too low.  please keep a record of the readings and bring it to your next appointment here.  please call us sooner if you are having low blood sugar episodes. Please make a follow-up appointment in 4 months.   A diabetes blood test is requested for you today.  We'll contact you with results.   For now, please change the insulin to 12 units with breakfast, and 25 units with the evening meal.

## 2014-06-24 NOTE — Assessment & Plan Note (Signed)
Asymptomatic. Continue medical management with BP, lipid control and ASA

## 2014-06-24 NOTE — Progress Notes (Signed)
John ConchStephen Elainna Eshleman, MD Phone: [phone]  Subjective:  Patient presents today to establish care with me as their new primary care provider. Patient was formerly a patient of Dr. Cato MulliganSwords. Chief complaint-noted.   Hypertension-controlled on manual repeat CAD with history MI- asymptomatic, controlled with medical management. Does not see cards.  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 No myalgias.    The following were reviewed and entered/updated in epic: Past Medical History  Diagnosis Date  . CORONARY ARTERY DISEASE 08/11/2006  . CVA 12/19/2006    2008  . DIABETES MELLITUS, TYPE I 08/11/2006  . ECZEMA, HANDS 12/15/2009  . HYPERLIPIDEMIA 08/11/2006  . HYPERTENSION 08/11/2006  . MYOCARDIAL INFARCTION, HX OF 08/11/2006    07/14/01   Patient Active Problem List   Diagnosis Date Noted  . Type 1 diabetes, uncontrolled, with peripheral circulatory disorder 06/14/2011    Priority: High  . CVA (cerebral infarction) 12/19/2006    Priority: High  . CAD (coronary artery disease) with history MI 08/11/2006    Priority: High  . Hyperlipidemia 08/11/2006    Priority: Medium  . Essential hypertension 08/11/2006    Priority: Medium  . Former smoker 06/24/2014    Priority: Low  . Eczema of both hands 12/15/2009    Priority: Low   Past Surgical History  Procedure Laterality Date  . Tonsilectomy, adenoidectomy, bilateral myringotomy and tubes    . Coronary stent placement      Family History  Problem Relation Age of Onset  .  Diabetes Neg Hx   . CAD Father     age 63  . CAD Paternal Grandfather     8770s  . CAD Paternal Grandmother     1270s  . Brain cancer Maternal Grandmother     Medications- reviewed and updated Current Outpatient Prescriptions  Medication Sig Dispense Refill  . aspirin 325 MG tablet Take 325 mg by mouth daily.      [name]. glucose blood (ONETOUCH VERIO) test strip 1 each by Other route 4 (four) times daily. And lances 4/day E11.9 120 each 3  . ibuprofen (ADVIL,MOTRIN) 200 MG tablet Take 200 mg by mouth every 6 (six) hours as needed.      . insulin aspart (NOVOLOG) 100 UNIT/ML FlexPen Inject into the skin 3 (three) times daily with meals. 12 units with breakfast, and 25 units with the evening meal, and pen needles 3/day    . Insulin Pen Needle (BD ULTRA-FINE PEN NEEDLES) 29G X 12.7MM MISC use as directed twice a day 200 each 2  . ramipril (ALTACE) 5 MG capsule Take 1 capsule (5 mg total) by mouth 2 (two) times daily. 60 capsule 3  . atorvastatin (LIPITOR) 40 MG tablet take 1 tablet by mouth once daily 30 tablet 5  . Lancets (ACCU-CHEK MULTICLIX) lancets Use as instructed to check blood sugar four times daily 200 each 3  . nitroGLYCERIN (NITROSTAT) 0.4 MG SL tablet Place 1 tablet (0.4 mg total) under the tongue every 5 (five) minutes as needed. (Patient not taking: Reported on 06/24/2014) 30 tablet 5   Allergies-reviewed and  updated No Known Allergies  History   Social History  . Marital Status: Married    Spouse Name: N/A  . Number of Children: N/A  . Years of Education: N/A   Occupational History  . Writer   Social History Main Topics  . Smoking status: Former Smoker -- 1.00 packs/day for 30 years    Types: Cigarettes    Quit date: 05/03/2000  . Smokeless tobacco: Not on file  . Alcohol Use: 0.0 oz/week    0 Standard drinks or equivalent per week     Comment: 1-2/month  . Drug Use: No  . Sexual Activity: Not on file   Other Topics Concern  . None    Social History Narrative   Married (kids are patients here, wife seen elsewhere). 3 step children, 1 biological. No grandkids.       Emergency planning/management officer for general [employer]      Hobbies: race sail boats, adrenaline related activities    ROS--See HPI   Objective: BP 132/88 mmHg  Pulse 68  Temp(Src) 97.6 F (36.4 C)  Wt 185 lb (83.915 kg) Gen: NAD, resting comfortably HEENT: Mucous membranes are moist. Oropharynx normal. Turbinates erythematous with some yellow drainage. TM largely normal. R sided sinus pressure.  CV: RRR no murmurs rubs or gallops Lungs: CTAB no crackles, wheeze, rhonchi Abdomen: soft/nontender/nondistended/normal bowel sounds. No rebound or guarding.  Ext: no edema Skin: warm, dry, no rash Neuro: grossly normal, moves all extremities, PERRLA   Assessment/Plan:  CAD (coronary artery disease) with history MI Asymptomatic. Continue medical management with BP, lipid control and ASA   CVA (cerebral infarction) No recurrence. Continue medical management with BP, lipid control and ASA   Hyperlipidemia Very poor control off atorvastatin . Refilled and advised daily use   Essential hypertension Actually only taking Ramipril  daily and controlled so continue current usage   Viral sinusitis S: history of sinus issues. 1 week sinus pressure with nasal congestion primarily on right side A/P: discussed would call in augmentin if symptoms persist 1-2 weeks despite flonase start, patient lieks to avoid oral meds as possible as one of reasons for delay  Agrees to colonoscopy Orders Placed This Encounter  Procedures  . Pneumococcal conjugate vaccine 13-valent  . CBC    [place]  . Comprehensive metabolic panel    [place]    Order Specific Question:  Has the patient fasted?    Answer:  No  . Lipid panel    [place]    Order Specific Question:  Has the patient fasted?    Answer:  No  . TSH    [place]  . Ambulatory referral to Gastroenterology     Referral Priority:  Routine    Referral Type:  Consultation    Referral Reason:  Specialty Services Required    Requested Specialty:  Gastroenterology    Number of Visits Requested:  1    Meds ordered this encounter  Medications  . ramipril (ALTACE) 5 MG capsule    Sig: Take 1 capsule (5 mg total) by mouth 2 (two) times daily.    Dispense:  60 capsule    Refill:  11  . nitroGLYCERIN (NITROSTAT) 0.4 MG SL tablet    Sig: Place 1 tablet (0.4 mg total) under the tongue every 5 (five) minutes as needed.    Dispense:  30 tablet    Refill:  5  . atorvastatin (LIPITOR) 40 MG tablet    Sig: Take 1 tablet (40 mg total) by  mouth daily.    Dispense:  30 tablet    Refill:  11  . triamcinolone cream (KENALOG) 0.1 %    Sig: Apply 1 application topically 2 (two) times daily. 10 days only at a time    Dispense:  80 g    Refill:  0

## 2014-06-24 NOTE — Assessment & Plan Note (Signed)
Actually only taking Ramipril 5mg  daily and controlled so continue current usage

## 2014-06-24 NOTE — Progress Notes (Signed)
Subjective:    Patient ID: John BolusStephen A Cavazos, male    DOB: 1950-10-26, 64 y.o.   MRN: [id number]  HPI Pt returns for f/u of diabetes mellitus: DM type: 1 Dx'ed: 2008. Complications: CVA, PAD, and CAD. Therapy: insulin since soon after dx. DKA: never Severe hypoglycemia: never Pancreatitis: never Other: he declines pump; he chose BID premixed insulin.  Interval history: no cbg record, but states cbg's are well-controlled.  He has hypoglycemia approx once per week.  This usually happens at lunch.  It is highest at hs and in am.  He sometimes takes as much as 30 units with the evening meal.  Past Medical History  Diagnosis Date  . CORONARY ARTERY DISEASE 08/11/2006  . CVA 12/19/2006    2008  . DIABETES MELLITUS, TYPE I 08/11/2006  . ECZEMA, HANDS 12/15/2009  . HYPERLIPIDEMIA 08/11/2006  . HYPERTENSION 08/11/2006  . MYOCARDIAL INFARCTION, HX OF 08/11/2006    07/14/01    Past Surgical History  Procedure Laterality Date  . Tonsilectomy, adenoidectomy, bilateral myringotomy and tubes    . Coronary stent placement      History   Social History  . Marital Status: Married    Spouse Name: N/A  . Number of Children: N/A  . Years of Education: N/A   Occupational History  . Writerroduction Manager [employer]   Social History Main Topics  . Smoking status: Former Smoker -- 1.00 packs/day for 30 years    Types: Cigarettes    Quit date: 05/03/2000  . Smokeless tobacco: Not on file  . Alcohol Use: 0.0 oz/week    0 Standard drinks or equivalent per week     Comment: 1-2/month  . Drug Use: No  . Sexual Activity: Not on file   Other Topics Concern  . Not on file   Social History Narrative   Married (kids are patients here, wife seen elsewhere). 3 step children, 1 biological. No grandkids.       Emergency planning/management officerroject manager for [employer]      Hobbies: race sail boats, adrenaline related activities    Current Outpatient Prescriptions on File Prior to Visit  Medication Sig Dispense  Refill  . aspirin 325 MG tablet Take 325 mg by mouth daily.      [name]. glucose blood (ONETOUCH VERIO) test strip 1 each by Other route 4 (four) times daily. And lances 4/day E11.9 120 each 3  . ibuprofen (ADVIL,MOTRIN) 200 MG tablet Take 200 mg by mouth every 6 (six) hours as needed.      . Insulin Pen Needle (BD ULTRA-FINE PEN NEEDLES) 29G X 12.7MM MISC use as directed twice a day 200 each 2  . atorvastatin (LIPITOR) 40 MG tablet Take 1 tablet (40 mg total) by mouth daily. 30 tablet 11  . Lancets (ACCU-CHEK MULTICLIX) lancets Use as instructed to check blood sugar four times daily 200 each 3  . nitroGLYCERIN (NITROSTAT) 0.4 MG SL tablet Place 1 tablet (0.4 mg total) under the tongue every 5 (five) minutes as needed. 30 tablet 5  . ramipril (ALTACE) 5 MG capsule Take 1 capsule (5 mg total) by mouth 2 (two) times daily. 60 capsule 11  . triamcinolone cream (KENALOG) 0.1 % Apply 1 application topically 2 (two) times daily. 10 days only at a time 80 g 0   No current facility-administered medications on file prior to visit.    No Known Allergies  Family History  Problem Relation Age of Onset  . Diabetes Neg Hx   .  CAD Father     age 63  . CAD Paternal Grandfather     3870s  . CAD Paternal Grandmother     7870s  . Brain cancer Maternal Grandmother     BP 134/88 mmHg  Pulse 77  Ht 5\' 8"  (1.727 m)  Wt 183 lb (83.008 kg)  BMI 27.83 kg/m2  SpO2 97%  Review of Systems Denies LOC    Objective:   Physical Exam VITAL SIGNS:  See vs page GENERAL: no distress Pulses: dorsalis pedis intact bilat.   MSK: no deformity of the feet CV: no leg edema Skin:  no ulcer on the feet.  normal color and temp on the feet. Neuro: sensation is intact to touch on the feet   Lab Results  Component Value Date   HGBA1C 7.6* 06/24/2014       Assessment & Plan:  DM: this is the best control this pt should aim for, given this regimen, which does match insulin to his changing needs throughout the day.  The  pattern of his cbg's indicates he needs some adjustment in his therapy.  Patient is advised the following: Patient Instructions  check your blood sugar 4 times a day--before the 3 meals, and at bedtime.  also check if you have symptoms of your blood sugar being too high or too low.  please keep a record of the readings and bring it to your next appointment here.  please call us sooner if you are having low blood sugar episodes. Please make a follow-up appointment in 4 months.   A diabetes blood test is requested for you today.  We'll contact you with results.   For now, please change the insulin to 12 units with breakfast, and 25 units with the evening meal.

## 2014-06-24 NOTE — Assessment & Plan Note (Signed)
Very poor control off atorvastatin 40mg . Refilled and advised daily use

## 2014-06-24 NOTE — Patient Instructions (Addendum)
Get eye exam record faxed to us at [phone].  [facility] will call you to schedule repeat colonoscopy.  Great to meet you  BP looked fine this morning- no changes  Check cholesterol off meds. Suspect we will need to restart.   Happy to see you in 6 months for recheck bad cholesterol level back on meds  Would add flonase and if that sinus pressure not doing better in a week or two, give us a call and would call augmentin in for you.

## 2014-06-24 NOTE — Assessment & Plan Note (Signed)
No recurrence. Continue medical management with BP, lipid control and ASA

## 2014-07-14 ENCOUNTER — Telehealth: Payer: Self-pay | Admitting: Internal Medicine

## 2014-07-14 MED ORDER — AMOXICILLIN-POT CLAVULANATE 875-125 MG PO TABS: 1.0000 | ORAL_TABLET | Freq: Two times a day (BID) | ORAL | Status: DC

## 2014-07-14 NOTE — Telephone Encounter (Signed)
What strength of augmentin and directions?

## 2014-07-14 NOTE — Telephone Encounter (Signed)
Pt was seen on 5-20 and per pt was told if sinuses no better md will call abx into [facility] [place].

## 2014-07-14 NOTE — Telephone Encounter (Signed)
Pt notifed.

## 2014-07-14 NOTE — Telephone Encounter (Signed)
I sent it in. Please inform patient.

## 2014-10-28 ENCOUNTER — Ambulatory Visit (INDEPENDENT_AMBULATORY_CARE_PROVIDER_SITE_OTHER): Payer: 59 | Admitting: Endocrinology

## 2014-10-28 ENCOUNTER — Encounter: Payer: Self-pay | Admitting: Endocrinology

## 2014-10-28 ENCOUNTER — Ambulatory Visit (INDEPENDENT_AMBULATORY_CARE_PROVIDER_SITE_OTHER): Payer: 59

## 2014-10-28 VITALS — BP 122/84 | HR 78 | Temp 97.9°F | Ht 68.0 in | Wt 185.0 lb

## 2014-10-28 DIAGNOSIS — Z23 Encounter for immunization: Secondary | ICD-10-CM | POA: Diagnosis not present

## 2014-10-28 DIAGNOSIS — E1059 Type 1 diabetes mellitus with other circulatory complications: Secondary | ICD-10-CM

## 2014-10-28 DIAGNOSIS — E1051 Type 1 diabetes mellitus with diabetic peripheral angiopathy without gangrene: Secondary | ICD-10-CM

## 2014-10-28 DIAGNOSIS — IMO0002 Reserved for concepts with insufficient information to code with codable children: Secondary | ICD-10-CM

## 2014-10-28 DIAGNOSIS — E1065 Type 1 diabetes mellitus with hyperglycemia: Principal | ICD-10-CM

## 2014-10-28 LAB — MICROALBUMIN / CREATININE URINE RATIO
CREATININE, U: 33.2 mg/dL
MICROALB/CREAT RATIO: 2.1 mg/g (ref 0.0–30.0)
Microalb, Ur: <0.7 mg/dL (ref 0.0–1.9)

## 2014-10-28 LAB — POCT GLYCOSYLATED HEMOGLOBIN (HGB A1C): HEMOGLOBIN A1C: 7.8

## 2014-10-28 NOTE — Addendum Note (Signed)
Addended by: Bethann Punches E on: 10/28/2014 08:27 AM   Modules accepted: Orders

## 2014-10-28 NOTE — Progress Notes (Signed)
Subjective:    Patient ID: John Carrillo, male    DOB: 03-17-1950, 64 y.o.   MRN: [id number]  HPI Pt returns for f/u of diabetes mellitus: DM type: 1 Dx'ed: 2008. Complications: CVA, PAD, and CAD. Therapy: insulin since soon after dx. DKA: never Severe hypoglycemia: never Pancreatitis: never Other: he declines pump; he chose BID premixed insulin.  Interval history: He seldom has hypoglycemia, and these episodes are mild.  no cbg record, but states cbg's are highest in am.  pt states he feels well in general. Past Medical History  Diagnosis Date  . CORONARY ARTERY DISEASE 08/11/2006  . CVA 12/19/2006    2008  . DIABETES MELLITUS, TYPE I 08/11/2006  . ECZEMA, HANDS 12/15/2009  . HYPERLIPIDEMIA 08/11/2006  . HYPERTENSION 08/11/2006  . MYOCARDIAL INFARCTION, HX OF 08/11/2006    07/14/01    Past Surgical History  Procedure Laterality Date  . Tonsilectomy, adenoidectomy, bilateral myringotomy and tubes    . Coronary stent placement      Social History   Social History  . Marital Status: Married    Spouse Name: N/A  . Number of Children: N/A  . Years of Education: N/A   Occupational History  . Writer   Social History Main Topics  . Smoking status: Former Smoker -- 1.00 packs/day for 30 years    Types: Cigarettes    Quit date: 05/03/2000  . Smokeless tobacco: Not on file  . Alcohol Use: 0.0 oz/week    0 Standard drinks or equivalent per week     Comment: 1-2/month  . Drug Use: No  . Sexual Activity: Not on file   Other Topics Concern  . Not on file   Social History Narrative   Married (kids are patients here, wife seen elsewhere). 3 step children, 1 biological. No grandkids.       Emergency planning/management officer for [employer]      Hobbies: race sail boats, adrenaline related activities    Current Outpatient Prescriptions on File Prior to Visit  Medication Sig Dispense Refill  . aspirin 325 MG tablet Take 325 mg by mouth daily.      [name]  atorvastatin (LIPITOR) 40 MG tablet Take 1 tablet (40 mg total) by mouth daily. 30 tablet 11  . glucose blood (ONETOUCH VERIO) test strip 1 each by Other route 4 (four) times daily. And lances 4/day E11.9 120 each 3  . ibuprofen (ADVIL,MOTRIN) 200 MG tablet Take 200 mg by mouth every 6 (six) hours as needed.      . insulin aspart (NOVOLOG) 100 UNIT/ML FlexPen Inject into the skin 3 (three) times daily with meals. 11 units with breakfast, and 26 units with the evening meal, and pen needles 3/day    . Insulin Pen Needle (BD ULTRA-FINE PEN NEEDLES) 29G X 12.7MM MISC use as directed twice a day 200 each 2  . nitroGLYCERIN (NITROSTAT) 0.4 MG SL tablet Place 1 tablet (0.4 mg total) under the tongue every 5 (five) minutes as needed. 30 tablet 5  . ramipril (ALTACE) 5 MG capsule Take 1 capsule (5 mg total) by mouth 2 (two) times daily. 60 capsule 11  . triamcinolone cream (KENALOG) 0.1 % Apply 1 application topically 2 (two) times daily. 10 days only at a time 80 g 0  . Lancets (ACCU-CHEK MULTICLIX) lancets Use as instructed to check blood sugar four times daily 200 each 3   No current facility-administered medications on file prior to visit.    No  Known Allergies  Family History  Problem Relation Age of Onset  . Diabetes Neg Hx   . CAD Father     age 60  . CAD Paternal Grandfather     78s  . CAD Paternal Grandmother     10s  . Brain cancer Maternal Grandmother     BP 122/84 mmHg  Pulse 78  Temp(Src) 97.9 F (36.6 C) (Oral)  Ht  (1.727 m)  Wt 185 lb (83.915 kg)  BMI 28.14 kg/m2  SpO2 97%  Review of Systems Denies LOC    Objective:   Physical Exam VITAL SIGNS:  See vs page GENERAL: no distress Pulses: dorsalis pedis intact bilat.   MSK: no deformity of the feet CV: no leg edema Skin:  no ulcer on the feet.  normal color and temp on the feet. Neuro: sensation is intact to touch on the feet  A1c=7.8%     Assessment & Plan:  DM: The pattern of his cbg's indicates he  needs some adjustment in his therapy  Patient is advised the following: Patient Instructions  check your blood sugar 4 times a day--before the 3 meals, and at bedtime.  also check if you have symptoms of your blood sugar being too high or too low.  please keep a record of the readings and bring it to your next appointment here.  please call us sooner if you are having low blood sugar episodes. Please make a follow-up appointment in 4-5 months.   A diabetes urine test is requested for you today.  We'll contact you with results.   For now, please change the insulin to 11 units with breakfast, and 26 units with the evening meal.

## 2014-10-28 NOTE — Patient Instructions (Addendum)
check your blood sugar 4 times a day--before the 3 meals, and at bedtime.  also check if you have symptoms of your blood sugar being too high or too low.  please keep a record of the readings and bring it to your next appointment here.  please call us sooner if you are having low blood sugar episodes. Please make a follow-up appointment in 4-5 months.   A diabetes urine test is requested for you today.  We'll contact you with results.   For now, please change the insulin to 11 units with breakfast, and 26 units with the evening meal.

## 2014-12-19 ENCOUNTER — Other Ambulatory Visit: Payer: Self-pay

## 2014-12-19 MED ORDER — GLUCOSE BLOOD VI STRP
1.0000 | ORAL_STRIP | Freq: Four times a day (QID) | Status: DC
Start: 2014-12-19 — End: 2015-06-16

## 2014-12-23 ENCOUNTER — Encounter: Payer: Self-pay | Admitting: Gastroenterology

## 2014-12-23 ENCOUNTER — Encounter: Payer: Self-pay | Admitting: Family Medicine

## 2014-12-23 ENCOUNTER — Ambulatory Visit (INDEPENDENT_AMBULATORY_CARE_PROVIDER_SITE_OTHER): Payer: 59 | Admitting: Family Medicine

## 2014-12-23 ENCOUNTER — Other Ambulatory Visit: Payer: Self-pay | Admitting: Family Medicine

## 2014-12-23 VITALS — BP 120/82 | HR 75 | Temp 98.4°F | Wt 178.0 lb

## 2014-12-23 DIAGNOSIS — I10 Essential (primary) hypertension: Secondary | ICD-10-CM

## 2014-12-23 DIAGNOSIS — E785 Hyperlipidemia, unspecified: Secondary | ICD-10-CM

## 2014-12-23 DIAGNOSIS — Z20828 Contact with and (suspected) exposure to other viral communicable diseases: Secondary | ICD-10-CM

## 2014-12-23 DIAGNOSIS — I2510 Atherosclerotic heart disease of native coronary artery without angina pectoris: Secondary | ICD-10-CM | POA: Diagnosis not present

## 2014-12-23 DIAGNOSIS — Z1211 Encounter for screening for malignant neoplasm of colon: Secondary | ICD-10-CM | POA: Diagnosis not present

## 2014-12-23 LAB — TSH: TSH: 7.31 u[IU]/mL — AB (ref 0.35–4.50)

## 2014-12-23 LAB — LDL CHOLESTEROL, DIRECT: Direct LDL: 100 mg/dL

## 2014-12-23 NOTE — Progress Notes (Signed)
Tana Conch, MD  Subjective:  John Carrillo is a 64 y.o. year old very pleasant male patient who presents for/with See problem oriented charting ROS- No chest pain or shortness of breath or dyspnea on exertion. No headache or blurry vision.   Past Medical History-  Patient Active Problem List   Diagnosis Date Noted  . Type 1 diabetes, uncontrolled, with peripheral circulatory disorder (HCC) 06/14/2011    Priority: High  . CVA (cerebral infarction) 12/19/2006    Priority: High  . CAD (coronary artery disease) with history MI 08/11/2006    Priority: High  . Hyperlipidemia 08/11/2006    Priority: Medium  . Essential hypertension 08/11/2006    Priority: Medium  . Former smoker 06/24/2014    Priority: Low  . Eczema of both hands 12/15/2009    Priority: Low    Medications- reviewed and updated Current Outpatient Prescriptions  Medication Sig Dispense Refill  . aspirin 325 MG tablet Take 325 mg by mouth daily.      [name] atorvastatin (LIPITOR) 40 MG tablet Take 1 tablet (40 mg total) by mouth daily. 30 tablet 11  . glucose blood (ONETOUCH VERIO) test strip 1 each by Other route 4 (four) times daily. And lances 4/day E11.9 120 each 3  . insulin aspart (NOVOLOG) 100 UNIT/ML FlexPen Inject into the skin 3 (three) times daily with meals. 11 units with breakfast, and 26 units with the evening meal, and pen needles 3/day    . Insulin Pen Needle (BD ULTRA-FINE PEN NEEDLES) 29G X 12.7MM MISC use as directed twice a day 200 each 2  . ramipril (ALTACE) 5 MG capsule Take 1 capsule (5 mg total) by mouth 2 (two) times daily. 60 capsule 11  . triamcinolone cream (KENALOG) 0.1 % Apply 1 application topically 2 (two) times daily. 10 days only at a time 80 g 0  . ibuprofen (ADVIL,MOTRIN) 200 MG tablet Take 200 mg by mouth every 6 (six) hours as needed.      . Lancets (ACCU-CHEK MULTICLIX) lancets Use as instructed to check blood sugar four times daily 200 each 3  . nitroGLYCERIN (NITROSTAT) 0.4 MG SL  tablet Place 1 tablet (0.4 mg total) under the tongue every 5 (five) minutes as needed. (Patient not taking: Reported on 12/23/2014) 30 tablet 5   No current facility-administered medications for this visit.    Objective: BP 120/82 mmHg  Pulse 75  Temp(Src) 98.4 F (36.9 C)  Wt 178 lb (80.74 kg) Gen: NAD, resting comfortably CV: RRR no murmurs rubs or gallops Lungs: CTAB no crackles, wheeze, rhonchi Abdomen: soft/nontender/nondistended/normal bowel sounds. No rebound or guarding.  Ext: no edema, 2+ PT pulses Skin: warm, dry Neuro: grossly normal, moves all extremities  Assessment/Plan:  CAD (coronary artery disease) with history MI S: asymptomatic. Compliant with aspirin and statin though LDL above goal (could be thyroid related)  Saw Dr. [name] previously. Age 16, large anterior wall MI, late lytic therapy and rescue stent at [facility] in 2003. Had abnormal Myoview with large anterior wall scar and inferior wall ischemia mid apical level after MI. Has been at least >5 years since stress testing.  A/P: Patient has done very well but I would at least like for him to touch base with cardiology. Have referred at this time. Also, need to push LDL to <70 (see HLD)  Hyperlipidemia S: improved controlled on atorvastatin  again. No myalgias. But LDL goal <70 with CAD Lab Results  Component Value Date   CHOL  252* 06/24/2014   HDL 61.70 06/24/2014   LDLCALC 172* 06/24/2014   LDLDIRECT 100.0 12/23/2014   TRIG 94.0 06/24/2014   CHOLHDL 4 06/24/2014  A/P: TSH mildly elevated- repeat 6-8 weeks both LDL and TSH. If TSH normal and LDL remains above goal- increase to 80mg  atorvastatin    Essential hypertension S: controlled.  On ramipril 5mg  daily  BP Readings from Last 3 Encounters:  12/23/14 120/82  10/28/14 122/84  06/24/14 132/88  A/P:Continue current meds:  Doing well   6 months- if patient wants to see cards once yearly and see me every 6 months in between  reasonable Return precautions advised.   Orders Placed This Encounter  . Ambulatory referral to Gastroenterology    Referral Priority:  Routine    Referral Type:  Consultation    Referral Reason:  Specialty Services Required    Number of Visits Requested:  1  . Ambulatory referral to Cardiology    Referral Priority:  Routine    Referral Type:  Consultation    Referral Reason:  Specialty Services Required    Referred to Provider:  Wendall StadePeter C Nishan, MD    Requested Specialty:  Cardiology    Number of Visits Requested:  1  Dr. [name]  nonfasting Results for orders placed or performed in visit on 12/23/14 (from the past 24 hour(s))  LDL cholesterol, direct     Status: None   Collection Time: 12/23/14 10:38 AM  Result Value Ref Range   Direct LDL 100.0 mg/dL  TSH     Status: Abnormal   Collection Time: 12/23/14 10:38 AM  Result Value Ref Range   TSH 7.31 (H) 0.35 - 4.50 uIU/mL

## 2014-12-23 NOTE — Patient Instructions (Addendum)
Get eye exam scheduled and have result faxed to us at [phone].  [facility] will call you to schedule colonoscopy.  Refer to Dr. [name]- will likely take some time to get in but reasonable as long as no symptoms  Blood pressure looks great  Update labs- LDL (bad cholesterol), TSH (slightly off last visit), and Hepatitis C per national screening recommendations

## 2014-12-24 LAB — HEPATITIS C ANTIBODY: HCV Ab: NEGATIVE

## 2014-12-24 NOTE — Assessment & Plan Note (Signed)
S: asymptomatic. Compliant with aspirin and statin though LDL above goal (could be thyroid related)  Saw Dr. [name] previously. Age 64, large anterior wall MI, late lytic therapy and rescue stent at [facility] in 2003. Had abnormal Myoview with large anterior wall scar and inferior wall ischemia mid apical level after MI. Has been at least >5 years since stress testing.  A/P: Patient has done very well but I would at least like for him to touch base with cardiology. Have referred at this time. Also, need to push LDL to <70 (see HLD)

## 2014-12-24 NOTE — Assessment & Plan Note (Signed)
S: controlled.  On ramipril 5mg  daily  BP Readings from Last 3 Encounters:  12/23/14 120/82  10/28/14 122/84  06/24/14 132/88  A/P:Continue current meds:  Doing well

## 2014-12-24 NOTE — Assessment & Plan Note (Signed)
S: improved controlled on atorvastatin 40mg  again. No myalgias. But LDL goal <70 with CAD Lab Results  Component Value Date   CHOL 252* 06/24/2014   HDL 61.70 06/24/2014   LDLCALC 172* 06/24/2014   LDLDIRECT 100.0 12/23/2014   TRIG 94.0 06/24/2014   CHOLHDL 4 06/24/2014  A/P: TSH mildly elevated- repeat 6-8 weeks both LDL and TSH. If TSH normal and LDL remains above goal- increase to 80mg  atorvastatin

## 2014-12-26 ENCOUNTER — Other Ambulatory Visit: Payer: Self-pay | Admitting: Family Medicine

## 2014-12-26 DIAGNOSIS — E785 Hyperlipidemia, unspecified: Secondary | ICD-10-CM

## 2014-12-28 ENCOUNTER — Other Ambulatory Visit: Payer: Self-pay | Admitting: Family Medicine

## 2014-12-28 DIAGNOSIS — E785 Hyperlipidemia, unspecified: Secondary | ICD-10-CM

## 2015-01-14 ENCOUNTER — Emergency Department (HOSPITAL_COMMUNITY): Payer: 59

## 2015-01-14 ENCOUNTER — Inpatient Hospital Stay (HOSPITAL_COMMUNITY)
Admission: EM | Admit: 2015-01-14 | Discharge: 2015-01-17 | DRG: 247 | Disposition: A | Discharge status: Home / Self Care | Payer: 59 | Attending: Cardiology | Admitting: Cardiology

## 2015-01-14 ENCOUNTER — Encounter (HOSPITAL_COMMUNITY): Payer: Self-pay | Admitting: Oncology

## 2015-01-14 DIAGNOSIS — R079 Chest pain, unspecified: Secondary | ICD-10-CM

## 2015-01-14 DIAGNOSIS — I252 Old myocardial infarction: Secondary | ICD-10-CM

## 2015-01-14 DIAGNOSIS — E1129 Type 2 diabetes mellitus with other diabetic kidney complication: Secondary | ICD-10-CM

## 2015-01-14 DIAGNOSIS — Z955 Presence of coronary angioplasty implant and graft: Secondary | ICD-10-CM

## 2015-01-14 DIAGNOSIS — I255 Ischemic cardiomyopathy: Secondary | ICD-10-CM | POA: Diagnosis present

## 2015-01-14 DIAGNOSIS — L309 Dermatitis, unspecified: Secondary | ICD-10-CM | POA: Diagnosis present

## 2015-01-14 DIAGNOSIS — I25110 Atherosclerotic heart disease of native coronary artery with unstable angina pectoris: Secondary | ICD-10-CM | POA: Diagnosis present

## 2015-01-14 DIAGNOSIS — E785 Hyperlipidemia, unspecified: Secondary | ICD-10-CM | POA: Diagnosis present

## 2015-01-14 DIAGNOSIS — R7989 Other specified abnormal findings of blood chemistry: Secondary | ICD-10-CM

## 2015-01-14 DIAGNOSIS — I509 Heart failure, unspecified: Secondary | ICD-10-CM

## 2015-01-14 DIAGNOSIS — Z8249 Family history of ischemic heart disease and other diseases of the circulatory system: Secondary | ICD-10-CM | POA: Diagnosis not present

## 2015-01-14 DIAGNOSIS — Z794 Long term (current) use of insulin: Secondary | ICD-10-CM

## 2015-01-14 DIAGNOSIS — I5022 Chronic systolic (congestive) heart failure: Secondary | ICD-10-CM | POA: Diagnosis present

## 2015-01-14 DIAGNOSIS — N182 Chronic kidney disease, stage 2 (mild): Secondary | ICD-10-CM | POA: Diagnosis present

## 2015-01-14 DIAGNOSIS — I130 Hypertensive heart and chronic kidney disease with heart failure and stage 1 through stage 4 chronic kidney disease, or unspecified chronic kidney disease: Secondary | ICD-10-CM | POA: Diagnosis present

## 2015-01-14 DIAGNOSIS — Z7982 Long term (current) use of aspirin: Secondary | ICD-10-CM

## 2015-01-14 DIAGNOSIS — R778 Other specified abnormalities of plasma proteins: Secondary | ICD-10-CM

## 2015-01-14 DIAGNOSIS — E1022 Type 1 diabetes mellitus with diabetic chronic kidney disease: Secondary | ICD-10-CM | POA: Diagnosis present

## 2015-01-14 DIAGNOSIS — I447 Left bundle-branch block, unspecified: Secondary | ICD-10-CM | POA: Diagnosis present

## 2015-01-14 DIAGNOSIS — Z8673 Personal history of transient ischemic attack (TIA), and cerebral infarction without residual deficits: Secondary | ICD-10-CM

## 2015-01-14 DIAGNOSIS — I471 Supraventricular tachycardia: Secondary | ICD-10-CM | POA: Diagnosis present

## 2015-01-14 DIAGNOSIS — I2510 Atherosclerotic heart disease of native coronary artery without angina pectoris: Secondary | ICD-10-CM

## 2015-01-14 DIAGNOSIS — N183 Chronic kidney disease, stage 3 unspecified: Secondary | ICD-10-CM | POA: Diagnosis present

## 2015-01-14 DIAGNOSIS — I10 Essential (primary) hypertension: Secondary | ICD-10-CM | POA: Diagnosis present

## 2015-01-14 DIAGNOSIS — I214 Non-ST elevation (NSTEMI) myocardial infarction: Secondary | ICD-10-CM | POA: Diagnosis present

## 2015-01-14 DIAGNOSIS — Z9861 Coronary angioplasty status: Secondary | ICD-10-CM

## 2015-01-14 DIAGNOSIS — I2583 Coronary atherosclerosis due to lipid rich plaque: Secondary | ICD-10-CM | POA: Diagnosis present

## 2015-01-14 DIAGNOSIS — Z87891 Personal history of nicotine dependence: Secondary | ICD-10-CM

## 2015-01-14 DIAGNOSIS — R0789 Other chest pain: Secondary | ICD-10-CM | POA: Diagnosis present

## 2015-01-14 DIAGNOSIS — I200 Unstable angina: Secondary | ICD-10-CM | POA: Diagnosis present

## 2015-01-14 LAB — CBC
HCT: 47.5 % (ref 39.0–52.0)
HEMOGLOBIN: 16.2 g/dL (ref 13.0–17.0)
MCH: 33.4 pg (ref 26.0–34.0)
MCHC: 34.1 g/dL (ref 30.0–36.0)
MCV: 97.9 fL (ref 78.0–100.0)
Platelets: 211 10*3/uL (ref 150–400)
RBC: 4.85 MIL/uL (ref 4.22–5.81)
RDW: 13.2 % (ref 11.5–15.5)
WBC: 15.1 10*3/uL — ABNORMAL HIGH (ref 4.0–10.5)

## 2015-01-14 LAB — BASIC METABOLIC PANEL
ANION GAP: 11 (ref 5–15)
BUN: 22 mg/dL — ABNORMAL HIGH (ref 6–20)
CALCIUM: 9.2 mg/dL (ref 8.9–10.3)
CO2: 25 mmol/L (ref 22–32)
Chloride: 106 mmol/L (ref 101–111)
Creatinine, Ser: 1.38 mg/dL — ABNORMAL HIGH (ref 0.61–1.24)
GFR, EST NON AFRICAN AMERICAN: 53 mL/min — AB (ref >60)
GLUCOSE: 177 mg/dL — AB (ref 65–99)
Potassium: 3.8 mmol/L (ref 3.5–5.1)
Sodium: 142 mmol/L (ref 135–145)

## 2015-01-14 LAB — I-STAT TROPONIN, ED: TROPONIN I, POC: 0.18 ng/mL — AB (ref 0.00–0.08)

## 2015-01-14 LAB — CBG MONITORING, ED: Glucose-Capillary: 162 mg/dL — ABNORMAL HIGH (ref 65–99)

## 2015-01-14 MED ORDER — DILTIAZEM HCL 25 MG/5ML IV SOLN: 15.0000 mg | Freq: Once | INTRAVENOUS | Status: DC

## 2015-01-14 MED ORDER — MORPHINE SULFATE (PF) 4 MG/ML IV SOLN
4.0000 mg | Freq: Once | INTRAVENOUS | Status: AC
Start: 2015-01-14 — End: 2015-01-14
  Administered 2015-01-14: 4 mg via INTRAVENOUS
  Filled 2015-01-14: qty 1

## 2015-01-14 MED ORDER — DILTIAZEM LOAD VIA INFUSION
15.0000 mg | Freq: Once | INTRAVENOUS | Status: AC
  Administered 2015-01-14: 15 mg via INTRAVENOUS
  Filled 2015-01-14: qty 15

## 2015-01-14 MED ORDER — ONDANSETRON HCL 4 MG/2ML IJ SOLN
4.0000 mg | Freq: Once | INTRAMUSCULAR | Status: AC
  Administered 2015-01-14: 4 mg via INTRAVENOUS
  Filled 2015-01-14: qty 2

## 2015-01-14 MED ORDER — HEPARIN (PORCINE) IN NACL 100-0.45 UNIT/ML-% IJ SOLN
1300.0000 [IU]/h | INTRAMUSCULAR | Status: DC
  Administered 2015-01-14: 900 [IU]/h via INTRAVENOUS
  Administered 2015-01-15: 1200 [IU]/h via INTRAVENOUS
  Filled 2015-01-14 (×3): qty 250

## 2015-01-14 MED ORDER — HEPARIN BOLUS VIA INFUSION
4000.0000 [IU] | Freq: Once | INTRAVENOUS | Status: AC
  Administered 2015-01-14: 4000 [IU] via INTRAVENOUS
  Filled 2015-01-14: qty 4000

## 2015-01-14 MED ORDER — DILTIAZEM HCL 100 MG IV SOLR
INTRAVENOUS | Status: AC
  Administered 2015-01-14: 15 mg/h
  Filled 2015-01-14: qty 100

## 2015-01-14 NOTE — ED Provider Notes (Addendum)
CSN: [id number]     Arrival date & time 01/14/15  2120 History   First MD Initiated Contact with Patient 01/14/15 2146     Chief Complaint  Patient presents with  . Chest Pain     (Consider location/radiation/quality/duration/timing/severity/associated sxs/prior Treatment) HPI Comments: Patient is a 64 year old male history of coronary artery disease status post stenting in the LAD, CVA, diabetes, hyperlipidemia, hypertension who presents today with right-sided chest pain radiating into the shoulder, shortness of breath and palpitations. Patient states for the last 3-4 days he's had URI symptoms with nasal congestion, cough, general malaise. He denies any fever, abdominal pain or vomiting. Earlier today he had  about 30 minutes of chest discomfort which resolved but then after dinner tonight a proximally 2 hours prior to arrival he developed sudden sharp intense right chest pain into the right shoulder, shortness of breath, palpitations that did not improve. No prior history of same. He sees Dr. [name] from cardiology and has a stress test scheduled for January which is just routine.  Patient is a 64 y.o. male presenting with chest pain. The history is provided by the patient.  Chest Pain   Past Medical History  Diagnosis Date  . CORONARY ARTERY DISEASE 08/11/2006  . CVA 12/19/2006    2008  . DIABETES MELLITUS, TYPE I 08/11/2006  . ECZEMA, HANDS 12/15/2009  . HYPERLIPIDEMIA 08/11/2006  . HYPERTENSION 08/11/2006  . MYOCARDIAL INFARCTION, HX OF 08/11/2006    07/14/01   Past Surgical History  Procedure Laterality Date  . Tonsilectomy, adenoidectomy, bilateral myringotomy and tubes    . Coronary stent placement     Family History  Problem Relation Age of Onset  . Diabetes Neg Hx   . CAD Father     age 63  . CAD Paternal Grandfather     5670s  . CAD Paternal Grandmother     3070s  . Brain cancer Maternal Grandmother    Social History  Substance Use Topics  . Smoking status: Former Smoker  -- 1.00 packs/day for 30 years    Types: Cigarettes    Quit date: 05/03/2000  . Smokeless tobacco: None  . Alcohol Use: 0.0 oz/week    0 Standard drinks or equivalent per week     Comment: 1-2/month    Review of Systems  Cardiovascular: Positive for chest pain.  All other systems reviewed and are negative.     Allergies  Review of patient's allergies indicates no known allergies.  Home Medications   Prior to Admission medications   Medication Sig Start Date End Date Taking? Authorizing Provider  aspirin 325 MG tablet Take 325 mg by mouth daily.      Historical Provider, MD  atorvastatin (LIPITOR) 40 MG tablet Take 1 tablet (40 mg total) by mouth daily. 06/24/14   Shelva MajesticStephen O Hunter, MD  glucose blood (ONETOUCH VERIO) test strip 1 each by Other route 4 (four) times daily. And lances 4/day E11.9 12/19/14   Romero BellingSean Ellison, MD  ibuprofen (ADVIL,MOTRIN) 200 MG tablet Take 200 mg by mouth every 6 (six) hours as needed.      Historical Provider, MD  insulin aspart (NOVOLOG) 100 UNIT/ML FlexPen Inject into the skin 3 (three) times daily with meals. 11 units with breakfast, and 26 units with the evening meal, and pen needles 3/day    Historical Provider, MD  Insulin Pen Needle (BD ULTRA-FINE PEN NEEDLES) 29G X 12.7MM MISC use as directed twice a day 09/03/13   Romero BellingSean Ellison, MD  Lancets (ACCU-CHEK  MULTICLIX) lancets Use as instructed to check blood sugar four times daily 10/15/10 08/11/13  Romero Belling, MD  nitroGLYCERIN (NITROSTAT) 0.4 MG SL tablet Place 1 tablet (0.4 mg total) under the tongue every 5 (five) minutes as needed. Patient not taking: Reported on 12/23/2014 06/24/14   [name], MD  ramipril (ALTACE) 5 MG capsule Take 1 capsule (5 mg total) by mouth 2 (two) times daily. 06/24/14   [name], MD  triamcinolone cream (KENALOG) 0.1 % Apply 1 application topically 2 (two) times daily. 10 days only at a time 06/24/14   [name], MD   BP 142/107 mmHg  Pulse 153  Resp 13   SpO2 99% Physical Exam  Constitutional: He is oriented to person, place, and time. He appears well-developed and well-nourished.  Appears uncomfortable but able speak in complete sentences  HENT:  Head: Normocephalic and atraumatic.  Mouth/Throat: Oropharynx is clear and moist.  Eyes: Conjunctivae and EOM are normal. Pupils are equal, round, and reactive to light.  Neck: Normal range of motion. Neck supple.  Cardiovascular: Regular rhythm and intact distal pulses.  Tachycardia present.   No murmur heard. Pulmonary/Chest: Effort normal and breath sounds normal. No respiratory distress. He has no wheezes. He has no rales.  Abdominal: Soft. He exhibits no distension. There is no tenderness. There is no rebound and no guarding.  Musculoskeletal: Normal range of motion. He exhibits edema. He exhibits no tenderness.  Trace edema in bilateral lower extremities  Neurological: He is alert and oriented to person, place, and time.  Skin: Skin is warm. No rash noted. He is diaphoretic. No erythema.  Psychiatric: He has a normal mood and affect. His behavior is normal.  Nursing note and vitals reviewed.   ED Course  .Cardioversion Date/Time: 01/14/2015 11:09 PM Performed by: Gwyneth Sprout Authorized by: Gwyneth Sprout Consent: Verbal consent obtained. Risks and benefits: risks, benefits and alternatives were discussed Consent given by: patient Patient understanding: patient states understanding of the procedure being performed Patient identity confirmed: verbally with patient Time out: Immediately prior to procedure a "time out" was called to verify the correct patient, procedure, equipment, support staff and site/side marked as required. Patient sedated: no Cardioversion basis: emergent Pre-procedure rhythm: supraventricular tachycardia Electrodes placed: anterior-posterior Number of attempts: chemical cardioversion preformed with 2 boluses of cardizem. Post-procedure rhythm: normal  sinus rhythm Complications: no complications Patient tolerance: Patient tolerated the procedure well with no immediate complications Comments: Only chemical cardioversion preformed.  No electrical cardioversion required.   (including critical care time) Labs Review Labs Reviewed  BASIC METABOLIC PANEL - Abnormal; Notable for the following:    Glucose, Bld 177 (*)    BUN 22 (*)    Creatinine, Ser 1.38 (*)    GFR calc non Af Amer 53 (*)    All other components within normal limits  CBC - Abnormal; Notable for the following:    WBC 15.1 (*)    All other components within normal limits  CBG MONITORING, ED - Abnormal; Notable for the following:    Glucose-Capillary 162 (*)    All other components within normal limits  I-STAT TROPOININ, ED - Abnormal; Notable for the following:    Troponin i, poc 0.18 (*)    All other components within normal limits    Imaging Review Dg Chest Portable 1 View  01/14/2015  CLINICAL DATA:  Chest pain. EXAM: PORTABLE CHEST 1 VIEW COMPARISON:  None. FINDINGS: Borderline cardiomegaly is noted. No pneumothorax or pleural effusion is  noted. Right lung is clear. Minimal left basilar subsegmental atelectasis. The visualized skeletal structures are unremarkable. IMPRESSION: Minimal left basilar subsegmental atelectasis. Borderline cardiomegaly. Electronically Signed   By: Lupita Raider, M.D.   On: 01/14/2015 21:58   I have personally reviewed and evaluated these images and lab results as part of my medical decision-making.   EKG Interpretation   Date/Time:  Saturday January 14 2015 21:44:37 EST Ventricular Rate:  87 PR Interval:  195 QRS Duration: 167 QT Interval:  379 QTC Calculation: 456 R Axis:   -24 Text Interpretation:  Sinus rhythm Left bundle branch block similar to EKG  on 12/13/2006 Confirmed by Anitra Lauth  MD, Alphonzo Lemmings ([id number]) on 01/14/2015  9:49:03 PM      MDM   Final diagnoses:  SVT (supraventricular tachycardia) (HCC)  Chest pain,  unspecified chest pain type  Elevated troponin    Patient is a 64 year old male with a significant cardiac history including MI, LAD stent, diabetes who presents today with mild chest discomfort earlier today and then approximately 2 hours prior to arrival said and onset of palpitations, right chest pain radiating into the shoulder, shortness of breath. Patient states for the last several days he has had URI symptoms but denies taking any over-the-counter medications, stimulants, caffeine. No fever, abdominal pain or vomiting. He has had no recent medication changes. He is taken 325 mg of aspirin today prior to arrival.  Upon arrival here patient's heart rate is 170 with an EKG that is abnormal with a widened QRS that appears to be regular. Patient has a known history of left bundle branch block. EKG concern for SVT versus a flutter with a 1:1.  Started with Cardizem bolus. Patient does not take beta blockers chronically. Improvement of heart rate to the 140s after a 15 mg bolus. Patient been given a second milligram Cardizem bolus with conversion to sinus rhythm. Feel most likely that this was SVT. No prior history of the same.  Currently patient is still having right chest pain and shoulder pain but has improved since his rhythm converted.  Troponin is mildly elevated at 0.18. CBC with a leukocytosis of 15,000. Chest x-ray pending to ensure patient has no signs of pneumonia but lung sounds are clear.  11:09 PM Now pain is almost completely resolved. Spoke with cardiology and patient started on a heparin drip. Will admit to [facility].  CRITICAL CARE Performed by: Gwyneth Sprout Total critical care time: 45 minutes Critical care time was exclusive of separately billable procedures and treating other patients. Critical care was necessary to treat or prevent imminent or life-threatening deterioration. Critical care was time spent personally by me on the following activities: development of  treatment plan with patient and/or surrogate as well as nursing, discussions with consultants, evaluation of patient's response to treatment, examination of patient, obtaining history from patient or surrogate, ordering and performing treatments and interventions, ordering and review of laboratory studies, ordering and review of radiographic studies, pulse oximetry and re-evaluation of patient's condition.   Gwyneth Sprout, MD 01/14/15 2310  Gwyneth Sprout, MD 01/14/15 [id number]  Gwyneth Sprout, MD 01/26/15 [phone]

## 2015-01-14 NOTE — ED Notes (Signed)
Verbal order for 15 mg bolus of Cardizem x 2.  First bolus in, second bolus infusing.  Dr. Anitra LauthPlunkett at bedside.

## 2015-01-14 NOTE — Progress Notes (Signed)
ANTICOAGULATION CONSULT NOTE - Initial Consult  Pharmacy Consult for Heparin Indication: chest pain/ACS  No Known Allergies  Patient Measurements: Weight: 177 lb 14.6 oz (80.7 kg) Heparin Dosing Weight:   Vital Signs: Temp: 98.8 F (37.1 C) (12/10 2207) Temp Source: Oral (12/10 2207) BP: 113/95 mmHg (12/10 2330) Pulse Rate: 82 (12/10 2330)  Labs:  Recent Labs  01/14/15 2129  HGB 16.2  HCT 47.5  PLT 211  CREATININE 1.38*    Estimated Creatinine Clearance: 53 mL/min (by C-G formula based on Cr of 1.38).   Medical History: Past Medical History  Diagnosis Date  . CORONARY ARTERY DISEASE 08/11/2006  . CVA 12/19/2006    2008  . DIABETES MELLITUS, TYPE I 08/11/2006  . ECZEMA, HANDS 12/15/2009  . HYPERLIPIDEMIA 08/11/2006  . HYPERTENSION 08/11/2006  . MYOCARDIAL INFARCTION, HX OF 08/11/2006    07/14/01    Medications:  Infusions:  . heparin 900 Units/hr (01/14/15 2347)    Assessment: Patient with chest pain in ED.  Elevated troponin noted.  Cardiology wants heparin per pharmacy for ACS dosing. No oral anticoagulants noted on med rec.   Goal of Therapy:  Heparin level 0.3-0.7 units/ml Monitor platelets by anticoagulation protocol: Yes   Plan:  Heparin bolus 4000 units iv x1 Heparin drip at 900 units/hr Daily CBC Next heparin level at 0800    Darlina GuysGrimsley Jr, Jacquenette ShoneJulian Crowford 01/14/2015,11:48 PM

## 2015-01-14 NOTE — ED Notes (Signed)
Pt presents d/t CP.  Hx of MI.  Tachycardic.

## 2015-01-14 NOTE — ED Notes (Signed)
MD at bedside. 

## 2015-01-14 NOTE — ED Notes (Signed)
ALL EKGs GIVEN TO EDP PLUNKETT FOR REVIEW WHILE AT PT BEDSIDE

## 2015-01-14 NOTE — ED Notes (Signed)
Cardizem infusing at 5 mg per verbal order from Dr. Anitra LauthPlunkett.

## 2015-01-15 DIAGNOSIS — I214 Non-ST elevation (NSTEMI) myocardial infarction: Principal | ICD-10-CM

## 2015-01-15 LAB — TROPONIN I
TROPONIN I: 0.19 ng/mL — AB (ref <0.031)
TROPONIN I: 0.13 ng/mL — AB (ref <0.031)
Troponin I: 0.2 ng/mL — ABNORMAL HIGH (ref <0.031)

## 2015-01-15 LAB — BASIC METABOLIC PANEL
ANION GAP: 6 (ref 5–15)
BUN: 20 mg/dL (ref 6–20)
CALCIUM: 8.5 mg/dL — AB (ref 8.9–10.3)
CO2: 28 mmol/L (ref 22–32)
Chloride: 107 mmol/L (ref 101–111)
Creatinine, Ser: 1.3 mg/dL — ABNORMAL HIGH (ref 0.61–1.24)
GFR, EST NON AFRICAN AMERICAN: 57 mL/min — AB (ref >60)
Glucose, Bld: 84 mg/dL (ref 65–99)
Potassium: 4 mmol/L (ref 3.5–5.1)
Sodium: 141 mmol/L (ref 135–145)

## 2015-01-15 LAB — I-STAT TROPONIN, ED: TROPONIN I, POC: 0.16 ng/mL — AB (ref 0.00–0.08)

## 2015-01-15 LAB — CBC
HCT: 42.2 % (ref 39.0–52.0)
Hemoglobin: 14.1 g/dL (ref 13.0–17.0)
MCH: 32.9 pg (ref 26.0–34.0)
MCHC: 33.4 g/dL (ref 30.0–36.0)
MCV: 98.6 fL (ref 78.0–100.0)
PLATELETS: 147 10*3/uL — AB (ref 150–400)
RBC: 4.28 MIL/uL (ref 4.22–5.81)
RDW: 13.5 % (ref 11.5–15.5)
WBC: 12.6 10*3/uL — AB (ref 4.0–10.5)

## 2015-01-15 LAB — HEPARIN LEVEL (UNFRACTIONATED)
Heparin Unfractionated: 0.27 IU/mL — ABNORMAL LOW (ref 0.30–0.70)
Heparin Unfractionated: 0.25 IU/mL — ABNORMAL LOW (ref 0.30–0.70)

## 2015-01-15 LAB — GLUCOSE, CAPILLARY
GLUCOSE-CAPILLARY: 157 mg/dL — AB (ref 65–99)
Glucose-Capillary: 47 mg/dL — ABNORMAL LOW (ref 65–99)
Glucose-Capillary: 92 mg/dL (ref 65–99)
Glucose-Capillary: 340 mg/dL — ABNORMAL HIGH (ref 65–99)
Glucose-Capillary: 283 mg/dL — ABNORMAL HIGH (ref 65–99)

## 2015-01-15 MED ORDER — SODIUM CHLORIDE 0.9 % IV SOLN
INTRAVENOUS | Status: DC
  Administered 2015-01-15 (×2): via INTRAVENOUS

## 2015-01-15 MED ORDER — INSULIN ASPART 100 UNIT/ML ~~LOC~~ SOLN
0.0000 [IU] | Freq: Every day | SUBCUTANEOUS | Status: DC
  Administered 2015-01-15: 3 [IU] via SUBCUTANEOUS
  Administered 2015-01-16: 5 [IU] via SUBCUTANEOUS

## 2015-01-15 MED ORDER — CARVEDILOL 3.125 MG PO TABS
3.1250 mg | ORAL_TABLET | Freq: Two times a day (BID) | ORAL | Status: DC
  Administered 2015-01-15 – 2015-01-17 (×3): 3.125 mg via ORAL
  Filled 2015-01-15 (×3): qty 1

## 2015-01-15 MED ORDER — ACETAMINOPHEN 325 MG PO TABS: 650.0000 mg | ORAL_TABLET | Freq: Four times a day (QID) | ORAL | Status: DC | PRN

## 2015-01-15 MED ORDER — ASPIRIN EC 81 MG PO TBEC
81.0000 mg | DELAYED_RELEASE_TABLET | Freq: Every day | ORAL | Status: DC
  Administered 2015-01-15: 81 mg via ORAL
  Filled 2015-01-15: qty 1

## 2015-01-15 MED ORDER — INSULIN ASPART 100 UNIT/ML ~~LOC~~ SOLN
0.0000 [IU] | Freq: Three times a day (TID) | SUBCUTANEOUS | Status: DC
  Administered 2015-01-15: 7 [IU] via SUBCUTANEOUS
  Administered 2015-01-15: 2 [IU] via SUBCUTANEOUS
  Administered 2015-01-16: 5 [IU] via SUBCUTANEOUS
  Administered 2015-01-16: 1 [IU] via SUBCUTANEOUS
  Administered 2015-01-17: 07:00:00 5 [IU] via SUBCUTANEOUS

## 2015-01-15 MED ORDER — ATORVASTATIN CALCIUM 40 MG PO TABS
40.0000 mg | ORAL_TABLET | Freq: Every day | ORAL | Status: DC
  Administered 2015-01-15 – 2015-01-17 (×3): 40 mg via ORAL
  Filled 2015-01-15 (×3): qty 1

## 2015-01-15 MED ORDER — DEXTROSE 50 % IV SOLN
INTRAVENOUS | Status: AC
  Administered 2015-01-15: 50 mL
  Filled 2015-01-15: qty 50

## 2015-01-15 MED ORDER — ASPIRIN 81 MG PO CHEW
81.0000 mg | CHEWABLE_TABLET | ORAL | Status: AC
  Administered 2015-01-16: 81 mg via ORAL
  Filled 2015-01-15: qty 1

## 2015-01-15 MED ORDER — ACETAMINOPHEN 650 MG RE SUPP: 650.0000 mg | Freq: Four times a day (QID) | RECTAL | Status: DC | PRN

## 2015-01-15 MED ORDER — SODIUM CHLORIDE 0.9 % WEIGHT BASED INFUSION
3.0000 mL/kg/h | INTRAVENOUS | Status: DC
  Administered 2015-01-16: 3 mL/kg/h via INTRAVENOUS

## 2015-01-15 MED ORDER — SODIUM CHLORIDE 0.9 % IV SOLN: 250.0000 mL | INTRAVENOUS | Status: DC | PRN

## 2015-01-15 MED ORDER — SODIUM CHLORIDE 0.9 % IJ SOLN: 3.0000 mL | INTRAMUSCULAR | Status: DC | PRN

## 2015-01-15 MED ORDER — SODIUM CHLORIDE 0.9 % WEIGHT BASED INFUSION
1.0000 mL/kg/h | INTRAVENOUS | Status: DC
  Administered 2015-01-16: 1 mL/kg/h via INTRAVENOUS

## 2015-01-15 MED ORDER — GLUCAGON HCL RDNA (DIAGNOSTIC) 1 MG IJ SOLR
INTRAMUSCULAR | Status: AC
  Filled 2015-01-15: qty 1

## 2015-01-15 MED ORDER — RAMIPRIL 5 MG PO CAPS
5.0000 mg | ORAL_CAPSULE | Freq: Two times a day (BID) | ORAL | Status: DC
  Administered 2015-01-15 – 2015-01-17 (×5): 5 mg via ORAL
  Filled 2015-01-15 (×6): qty 1

## 2015-01-15 MED ORDER — SODIUM CHLORIDE 0.9 % IJ SOLN
3.0000 mL | Freq: Two times a day (BID) | INTRAMUSCULAR | Status: DC
  Administered 2015-01-16: 3 mL via INTRAVENOUS

## 2015-01-15 MED ORDER — SODIUM CHLORIDE 0.9 % IJ SOLN
3.0000 mL | Freq: Two times a day (BID) | INTRAMUSCULAR | Status: DC
  Administered 2015-01-15 – 2015-01-16 (×3): 3 mL via INTRAVENOUS

## 2015-01-15 NOTE — Progress Notes (Signed)
    Primary cardiologist: Dr. Peter Nishan (not seen in several years)  Patient admitted earlier this morning by the cardiology fellow. He has a reported history of LAD stenting greater than 10 years ago, large anterior wall infarct managed with lytic therapy and rescue PCI at [facility] in 2003. He has not seen Dr. Nishan for several years, had a recent visit with primary care in November with plan for follow-up stress testing arranged in January.  He developed fairly sudden onset "sharp" right lower thoracic discomfort yesterday evening while at a holiday party, this was followed by a feeling of warmth and diaphoresis, subsequently nausea. He never felt his heart racing, but on assessment in the ER was found to have a wide-complex tachycardia in the 150s to 160s. He was treated with Cardizem in the ER with subsequent conversion to sinus rhythm (I do not have the transition for review), and at baseline he has a left bundle-branch block when in sinus rhythm. At this point it is suspected that this was SVT. He does not report any prior history of cardiac arrhythmias.  He does mention what sounds like a cardiomyopathy, LVEF perhaps as low as the 30% range years ago. No recent follow-up for review. His cardiac enzymes are mildly abnormal with troponin I of 0.18 up to 0.20. He has had no further arrhythmias on telemetry monitoring, and is comfortable this morning without active symptoms.  Plan at this time is to manage him for NSTEMI, type I versus type II with SVT, and arrange for diagnostic cardiac catheterization tomorrow. Echocardiogram will be obtained to follow up on LVEF. Continue aspirin, Lipitor, Altace, and heparin. Add low-dose Coreg.   John Carrillo, M.D., F.A.C.C.  

## 2015-01-15 NOTE — ED Notes (Signed)
Report given to [facility]. 

## 2015-01-15 NOTE — Progress Notes (Addendum)
ANTICOAGULATION CONSULT NOTE  Pharmacy Consult for Heparin Indication: chest pain/ACS  No Known Allergies  Patient Measurements: Height: 5\' 9"  (175.3 cm) Weight: 180 lb 1.6 oz (81.693 kg) IBW/kg (Calculated) : 70.7 Heparin Dosing Weight: 81.7kg  Vital Signs: Temp: 97.8 F (36.6 C) (12/11 0527) Temp Source: Oral (12/11 0527) BP: 114/67 mmHg (12/11 0527) Pulse Rate: 72 (12/11 0527)  Labs:  Recent Labs  01/14/15 2129 01/15/15 0244 01/15/15 0913  HGB 16.2 14.1  --   HCT 47.5 42.2  --   PLT 211 147*  --   HEPARINUNFRC  --   --  0.27*  CREATININE 1.38*  --  1.30*  TROPONINI  --  0.20* 0.19*    Estimated Creatinine Clearance: 58.2 mL/min (by C-G formula based on Cr of 1.3).   Medical History: Past Medical History  Diagnosis Date  . CORONARY ARTERY DISEASE 08/11/2006  . CVA 12/19/2006    2008  . DIABETES MELLITUS, TYPE I 08/11/2006  . ECZEMA, HANDS 12/15/2009  . HYPERLIPIDEMIA 08/11/2006  . HYPERTENSION 08/11/2006  . MYOCARDIAL INFARCTION, HX OF 08/11/2006    07/14/01    Medications:  Infusions:  . sodium chloride 75 mL/hr at 01/15/15 0306  . heparin 900 Units/hr (01/14/15 2347)    Assessment: 64 yom with chest pain in ED.  Elevated troponin noted.  Cardiology wants heparin per pharmacy for ACS dosing. No oral anticoagulants noted on med rec. Initial HL slightly low (0.27) on 900 units/h. Hg wnl, plt down to 147. No bleed documented. Cath 12/12.  Goal of Therapy:  Heparin level 0.3-0.7 units/ml Monitor platelets by anticoagulation protocol: Yes   Plan:  Increase heparin to 1050 units/h 6h HL Daily HL/CBC Mon s/sx bleeding Cath 12/12    Babs BertinHaley Donae Kueker, PharmD, [facility]ba [facility] Matre[facility]PS Clinical Pharmacist Pager [phone] 01/15/2015 10:44 AM  ADDENDUM:  HL remains slightly low (0.25) after increase to 1050 units/h. Hg wnl, plt down to 147. No bleed or IV line issues per RN and gtt has not been stopped today. Cath 12/12.  Plan Increase heparin to 1200 units/h 6h HL, daily  HL/CBC Mon s/sx bleeding Cath 12/12  Babs BertinHaley Ericca Labra, PharmD, [facility] Clinical Pharmacist Pager [phone] 01/15/2015 6:08 PM

## 2015-01-15 NOTE — H&P (Signed)
C.c transfer from W/L ED for cp and elevated trop and palpitations  HPI:  64 y/o man with male history of coronary artery disease status post stenting in the LAD, CVA, diabetes, hyperlipidemia, hypertension who presents today with right-sided chest pain radiating into the shoulder, shortness of breath and palpitations. He sees Dr. [name] from cardiology and has a stress test scheduled for January which is just routine. His stent was placed in 2001. He is compliant with meds. He has  Baseline LBBB on ekg. In ed his initial ekg showed wide complex tachycardia lbbb with hr in 170s . He was give cardizem in ed and it converted him to Sinus.  First trop was mildly elevated so transferred to [place]. He was give asa and started on heparin drip  Non smoker, cp free now  Review of Systems:     Cardiac Review of Systems: {Y] = yes  = no  Chest Pain Cove.Etienne    ]  Resting SOB [   ] Exertional SOB  [  ]  Orthopnea [  ]   Pedal Edema [   ]    Palpitations Cove.Etienne  ] Syncope  [  ]   Presyncope [   ]  General Review of Systems: [Y] = yes [  ]=no Constitional: recent weight change [  ]; anorexia [  ]; fatigue [  ]; nausea [  ]; night sweats [  ]; fever [  ]; or chills [  ];                                                                     Dental: poor dentition[  ];   Eye : blurred vision [  ]; diplopia [   ]; vision changes [  ];  Amaurosis fugax[  ]; Resp: cough [  ];  wheezing[  ];  hemoptysis[  ]; shortness of breath[  ]; paroxysmal nocturnal dyspnea[  ]; dyspnea on exertion[  ]; or orthopnea[  ];  GI:  gallstones[  ], vomiting[  ];  dysphagia[  ]; melena[  ];  hematochezia [  ]; heartburn[  ];   GU: kidney stones [  ]; hematuria[  ];   dysuria [  ];  nocturia[  ];               Skin: rash [  ], swelling[  ];, hair loss[  ];  peripheral edema[  ];  or itching[  ]; Musculosketetal: myalgias[  ];  joint swelling[  ];  joint erythema[  ];  joint pain[  ];  back pain[  ];  Heme/Lymph: bruising[  ];   bleeding[  ];  anemia[  ];  Neuro: TIA[  ];  headaches[  ];  stroke[  ];  vertigo[  ];  seizures[  ];   paresthesias[  ];  difficulty walking[  ];  Psych:depression[  ]; anxiety[  ];  Endocrine: diabetes[  ];  thyroid dysfunction[  ];  Other:  Past Medical History  Diagnosis Date  . CORONARY ARTERY DISEASE 08/11/2006  . CVA 12/19/2006    2008  . DIABETES MELLITUS, TYPE I 08/11/2006  . ECZEMA, HANDS 12/15/2009  . HYPERLIPIDEMIA 08/11/2006  . HYPERTENSION 08/11/2006  . MYOCARDIAL INFARCTION,  HX OF 08/11/2006    07/14/01    No current facility-administered medications on file prior to encounter.   Current Outpatient Prescriptions on File Prior to Encounter  Medication Sig Dispense Refill  . aspirin 325 MG tablet Take 325 mg by mouth daily.      [name]. atorvastatin (LIPITOR) 40 MG tablet Take 1 tablet (40 mg total) by mouth daily. 30 tablet 11  . ibuprofen (ADVIL,MOTRIN) 200 MG tablet Take 400 mg by mouth every 6 (six) hours as needed for moderate pain.     . nitroGLYCERIN (NITROSTAT) 0.4 MG SL tablet Place 1 tablet (0.4 mg total) under the tongue every 5 (five) minutes as needed. (Patient taking differently: Place 0.4 mg under the tongue every 5 (five) minutes as needed for chest pain. Chest pain) 30 tablet 5  . ramipril (ALTACE) 5 MG capsule Take 1 capsule (5 mg total) by mouth 2 (two) times daily. 60 capsule 11  . glucose blood (ONETOUCH VERIO) test strip 1 each by Other route 4 (four) times daily. And lances 4/day E11.9 120 each 3  . Insulin Pen Needle (BD ULTRA-FINE PEN NEEDLES) 29G X 12.7MM MISC use as directed twice a day 200 each 2  . Lancets (ACCU-CHEK MULTICLIX) lancets Use as instructed to check blood sugar four times daily 200 each 3      No Known Allergies  Social History   Social History  . Marital Status: Married    Spouse Name: N/A  . Number of Children: N/A  . Years of Education: N/A   Occupational History  . Writerroduction Manager [employer]   Social History Main  Topics  . Smoking status: Former Smoker -- 1.00 packs/day for 30 years    Types: Cigarettes    Quit date: 05/03/2000  . Smokeless tobacco: Not on file  . Alcohol Use: 0.0 oz/week    0 Standard drinks or equivalent per week     Comment: 1-2/month  . Drug Use: No  . Sexual Activity: Not on file   Other Topics Concern  . Not on file   Social History Narrative   Married (kids are patients here, wife seen elsewhere). 3 step children, 1 biological. No grandkids.    In late 2016:   64 yo sophomore at [employer]- consider age 64 retirement   Oldest daughter - [employer]- social work intl business minor religion      Emergency planning/management officerroject manager for [employer]      Hobbies: race sail boats, adrenaline related activities    Family History  Problem Relation Age of Onset  . Diabetes Neg Hx   . CAD Father     age 63  . CAD Paternal Grandfather     8270s  . CAD Paternal Grandmother     1670s  . Brain cancer Maternal Grandmother     PHYSICAL EXAM: Filed Vitals:   01/15/15 0140 01/15/15 0216  BP: 119/80 114/78  Pulse: 80 81  Temp:  97.7 F (36.5 C)  Resp: 18 18   General:  Well appearing. No respiratory difficulty HEENT: normal Neck: supple. no JVD. Carotids 2+ bilat; no bruits. No lymphadenopathy or thryomegaly appreciated. Cor: PMI nondisplaced. Regular rate & rhythm. No rubs, gallops or murmurs. Lungs: clear Abdomen: soft, nontender, nondistended. No hepatosplenomegaly. No bruits or masses. Good bowel sounds. Extremities: no cyanosis, clubbing, rash, edema Neuro: alert & oriented x 3, cranial nerves grossly intact. moves all 4 extremities w/o difficulty. Affect pleasant.  ECG:  Results for orders placed or  performed during the hospital encounter of 01/14/15 (from the past 24 hour(s))  Basic metabolic panel     Status: Abnormal   Collection Time: 01/14/15  9:29 PM  Result Value Ref Range   Sodium 142 135 - 145 mmol/L   Potassium 3.8 3.5 - 5.1 mmol/L   Chloride 106 101 - 111  mmol/L   CO2 25 22 - 32 mmol/L   Glucose, Bld 177 (H) 65 - 99 mg/dL   BUN 22 (H) 6 - 20 mg/dL   Creatinine, Ser 8.29 (H) 0.61 - 1.24 mg/dL   Calcium 9.2 8.9 - 56.2 mg/dL   GFR calc non Af Amer 53 (L) >60 mL/min   GFR calc Af Amer >60 >60 mL/min   Anion gap 11 5 - 15  CBC     Status: Abnormal   Collection Time: 01/14/15  9:29 PM  Result Value Ref Range   WBC 15.1 (H) 4.0 - 10.5 K/uL   RBC 4.85 4.22 - 5.81 MIL/uL   Hemoglobin 16.2 13.0 - 17.0 g/dL   HCT 13.0 86.5 - 78.4 %   MCV 97.9 78.0 - 100.0 fL   MCH 33.4 26.0 - 34.0 pg   MCHC 34.1 30.0 - 36.0 g/dL   RDW 69.6 29.5 - 28.4 %   Platelets 211 150 - 400 K/uL  CBG monitoring, ED     Status: Abnormal   Collection Time: 01/14/15  9:32 PM  Result Value Ref Range   Glucose-Capillary 162 (H) 65 - 99 mg/dL  I-stat troponin, ED (not at [facility], [facility])     Status: Abnormal   Collection Time: 01/14/15  9:34 PM  Result Value Ref Range   Troponin i, poc 0.18 (HH) 0.00 - 0.08 ng/mL   Comment NOTIFIED PHYSICIAN    Comment 3          I-Stat Troponin, ED - 0, 3, 6 hours (not at [facility])     Status: Abnormal   Collection Time: 01/15/15 12:49 AM  Result Value Ref Range   Troponin i, poc 0.16 (HH) 0.00 - 0.08 ng/mL   Comment NOTIFIED PHYSICIAN    Comment 3           Dg Chest Portable 1 View  01/14/2015  CLINICAL DATA:  Chest pain. EXAM: PORTABLE CHEST 1 VIEW COMPARISON:  None. FINDINGS: Borderline cardiomegaly is noted. No pneumothorax or pleural effusion is noted. Right lung is clear. Minimal left basilar subsegmental atelectasis. The visualized skeletal structures are unremarkable. IMPRESSION: Minimal left basilar subsegmental atelectasis. Borderline cardiomegaly. Electronically Signed   By: Lupita Raider, M.D.   On: 01/14/2015 21:58    EKG 1 lbbb, wide complex tachy likely SVT   EKG2 LBBB SR    ASSESSMENT: Elevated trop nstemi in setting of SVT likely type II but he has underlying CAD  HLP IDDM   PLAN/DISCUSSION: 1. Admit to  inpatient 2. Npo 3. Asa , heparin drip 4. Statins, bb,  5. i think its reasonable to proceed directly with left cardiac cath given his prior cad 6. Sliding scale  7. Standard labs  [employer]

## 2015-01-15 NOTE — Progress Notes (Signed)
Blood glucose level was 47 and pt was asymptomatic.  Pt was given dextrose 50% solution increase glucose level while pt is nothing by mouth.  Pt's blood glucose 30 minutes later was 157.  The pt was given 2 Units of Novolog insulin according to the sliding scale.  Will inform next nurse. Harriet Massonavidson, Sander Remedios E, RN

## 2015-01-15 NOTE — Progress Notes (Signed)
01/15/2015 4:44 AM Troponin level has increased from 0.16 to 0.20.  Dr. Loney Lohathore was paged about the increase in troponin level.  No new orders were given.  Will continue to monitor pt. John Carrillo, [name], RN

## 2015-01-15 NOTE — Progress Notes (Signed)
Pt arrived on stretcher with heparin drip going and on 2L oxygen by cannula.  Pt is alert and oriented.  No chest pain currently.  [facility] gave report to nurse.  Pt is resting in bed with call bell at side.  Will continue to monitor pt. Harriet Massonavidson, Amariya Liskey E, RN

## 2015-01-16 ENCOUNTER — Inpatient Hospital Stay (HOSPITAL_COMMUNITY): Payer: 59

## 2015-01-16 ENCOUNTER — Encounter (HOSPITAL_COMMUNITY)
Admission: EM | Disposition: A | Discharge status: Home / Self Care | Payer: 59 | Source: Home / Self Care | Attending: Cardiology

## 2015-01-16 DIAGNOSIS — I200 Unstable angina: Secondary | ICD-10-CM | POA: Diagnosis present

## 2015-01-16 DIAGNOSIS — I2510 Atherosclerotic heart disease of native coronary artery without angina pectoris: Secondary | ICD-10-CM

## 2015-01-16 DIAGNOSIS — R7989 Other specified abnormal findings of blood chemistry: Secondary | ICD-10-CM | POA: Insufficient documentation

## 2015-01-16 DIAGNOSIS — R778 Other specified abnormalities of plasma proteins: Secondary | ICD-10-CM | POA: Insufficient documentation

## 2015-01-16 HISTORY — PX: CARDIAC CATHETERIZATION: SHX172

## 2015-01-16 LAB — HEPARIN LEVEL (UNFRACTIONATED)
HEPARIN UNFRACTIONATED: 0.58 [IU]/mL (ref 0.30–0.70)
Heparin Unfractionated: 0.3 IU/mL (ref 0.30–0.70)

## 2015-01-16 LAB — GLUCOSE, CAPILLARY
GLUCOSE-CAPILLARY: 253 mg/dL — AB (ref 65–99)
GLUCOSE-CAPILLARY: 173 mg/dL — AB (ref 65–99)
GLUCOSE-CAPILLARY: 355 mg/dL — AB (ref 65–99)
Glucose-Capillary: 150 mg/dL — ABNORMAL HIGH (ref 65–99)

## 2015-01-16 LAB — PROTIME-INR
INR: 1.04 (ref 0.00–1.49)
Prothrombin Time: 13.8 seconds (ref 11.6–15.2)

## 2015-01-16 LAB — CBC
HEMATOCRIT: 39.4 % (ref 39.0–52.0)
HEMATOCRIT: 41.9 % (ref 39.0–52.0)
HEMOGLOBIN: 13.8 g/dL (ref 13.0–17.0)
Hemoglobin: 13.3 g/dL (ref 13.0–17.0)
MCH: 32.6 pg (ref 26.0–34.0)
MCH: 32.8 pg (ref 26.0–34.0)
MCHC: 32.9 g/dL (ref 30.0–36.0)
MCHC: 33.8 g/dL (ref 30.0–36.0)
MCV: 99.1 fL (ref 78.0–100.0)
MCV: 97.3 fL (ref 78.0–100.0)
Platelets: 162 10*3/uL (ref 150–400)
Platelets: 180 10*3/uL (ref 150–400)
RBC: 4.23 MIL/uL (ref 4.22–5.81)
RBC: 4.05 MIL/uL — AB (ref 4.22–5.81)
RDW: 13.4 % (ref 11.5–15.5)
RDW: 13.3 % (ref 11.5–15.5)
WBC: 7.2 10*3/uL (ref 4.0–10.5)
WBC: 7.7 10*3/uL (ref 4.0–10.5)

## 2015-01-16 LAB — HEMOGLOBIN A1C
Hgb A1c MFr Bld: 9.3 % — ABNORMAL HIGH (ref 4.8–5.6)
Mean Plasma Glucose: 220 mg/dL

## 2015-01-16 LAB — POCT ACTIVATED CLOTTING TIME: ACTIVATED CLOTTING TIME: 446 s

## 2015-01-16 SURGERY — LEFT HEART CATH AND CORONARY ANGIOGRAPHY

## 2015-01-16 MED ORDER — SODIUM CHLORIDE 0.9 % IJ SOLN: 3.0000 mL | INTRAMUSCULAR | Status: DC | PRN

## 2015-01-16 MED ORDER — HEPARIN (PORCINE) IN NACL 2-0.9 UNIT/ML-% IJ SOLN
INTRAMUSCULAR | Status: AC
  Filled 2015-01-16: qty 1500

## 2015-01-16 MED ORDER — BIVALIRUDIN BOLUS VIA INFUSION - CUPID
INTRAVENOUS | Status: DC | PRN
  Administered 2015-01-16: 61.275 mg via INTRAVENOUS

## 2015-01-16 MED ORDER — ACETAMINOPHEN 325 MG PO TABS: 650.0000 mg | ORAL_TABLET | ORAL | Status: DC | PRN

## 2015-01-16 MED ORDER — SODIUM CHLORIDE 0.9 % IV SOLN: 250.0000 mL | INTRAVENOUS | Status: DC | PRN

## 2015-01-16 MED ORDER — TICAGRELOR 90 MG PO TABS
ORAL_TABLET | ORAL | Status: DC | PRN
  Administered 2015-01-16: 180 mg via ORAL

## 2015-01-16 MED ORDER — TICAGRELOR 90 MG PO TABS
ORAL_TABLET | ORAL | Status: AC
  Filled 2015-01-16: qty 2

## 2015-01-16 MED ORDER — BIVALIRUDIN 250 MG IV SOLR
INTRAVENOUS | Status: AC
  Filled 2015-01-16: qty 250

## 2015-01-16 MED ORDER — VERAPAMIL HCL 2.5 MG/ML IV SOLN
INTRA_ARTERIAL | Status: DC | PRN
  Administered 2015-01-16: 5 mL via INTRA_ARTERIAL

## 2015-01-16 MED ORDER — LIDOCAINE HCL (PF) 1 % IJ SOLN
INTRAMUSCULAR | Status: AC
  Filled 2015-01-16: qty 30

## 2015-01-16 MED ORDER — TICAGRELOR 90 MG PO TABS
90.0000 mg | ORAL_TABLET | Freq: Two times a day (BID) | ORAL | Status: DC
  Administered 2015-01-17 (×2): 90 mg via ORAL
  Filled 2015-01-16 (×2): qty 1

## 2015-01-16 MED ORDER — ONDANSETRON HCL 4 MG/2ML IJ SOLN: 4.0000 mg | Freq: Four times a day (QID) | INTRAMUSCULAR | Status: DC | PRN

## 2015-01-16 MED ORDER — ASPIRIN 81 MG PO CHEW
81.0000 mg | CHEWABLE_TABLET | Freq: Every day | ORAL | Status: DC
  Administered 2015-01-17: 81 mg via ORAL
  Filled 2015-01-16: qty 1

## 2015-01-16 MED ORDER — HEPARIN SODIUM (PORCINE) 1000 UNIT/ML IJ SOLN
INTRAMUSCULAR | Status: DC | PRN
  Administered 2015-01-16: 4000 [IU] via INTRAVENOUS

## 2015-01-16 MED ORDER — SODIUM CHLORIDE 0.9 % IV SOLN
250.0000 mg | INTRAVENOUS | Status: DC | PRN
  Administered 2015-01-16 (×2): 1.75 mg/kg/h via INTRAVENOUS

## 2015-01-16 MED ORDER — SODIUM CHLORIDE 0.9 % WEIGHT BASED INFUSION
1.0000 mL/kg/h | INTRAVENOUS | Status: AC
  Administered 2015-01-16: 19:00:00 1 mL/kg/h via INTRAVENOUS

## 2015-01-16 MED ORDER — NITROGLYCERIN 1 MG/10 ML FOR IR/CATH LAB
INTRA_ARTERIAL | Status: AC
  Filled 2015-01-16: qty 10

## 2015-01-16 MED ORDER — IOHEXOL 350 MG/ML SOLN
INTRAVENOUS | Status: DC | PRN
  Administered 2015-01-16: 160 mL via INTRA_ARTERIAL

## 2015-01-16 MED ORDER — SODIUM CHLORIDE 0.9 % IV SOLN
1.7500 mg/kg/h | INTRAVENOUS | Status: AC
  Administered 2015-01-16: 1.75 mg/kg/h via INTRAVENOUS
  Filled 2015-01-16: qty 250

## 2015-01-16 MED ORDER — MORPHINE SULFATE (PF) 2 MG/ML IV SOLN: 2.0000 mg | INTRAVENOUS | Status: DC | PRN

## 2015-01-16 MED ORDER — SODIUM CHLORIDE 0.9 % IJ SOLN
3.0000 mL | Freq: Two times a day (BID) | INTRAMUSCULAR | Status: DC
  Administered 2015-01-16 – 2015-01-17 (×2): 3 mL via INTRAVENOUS

## 2015-01-16 MED ORDER — VERAPAMIL HCL 2.5 MG/ML IV SOLN
INTRAVENOUS | Status: AC
  Filled 2015-01-16: qty 2

## 2015-01-16 SURGICAL SUPPLY — 25 items
BALLN EUPHORA RX 2.0X12 (BALLOONS) ×2
BALLN EUPHORA RX 2.5X12 (BALLOONS) ×2
BALLN ~~LOC~~ EMERGE MR 3.75X12 (BALLOONS) ×2
BALLN ~~LOC~~ EUPHORA RX 3.75X15 (BALLOONS) ×2
BALLOON EUPHORA RX 2.0X12 (BALLOONS) IMPLANT
BALLOON EUPHORA RX 2.5X12 (BALLOONS) IMPLANT
BALLOON ~~LOC~~ EMERGE MR 3.75X12 (BALLOONS) IMPLANT
BALLOON ~~LOC~~ EUPHORA RX 3.75X15 (BALLOONS) IMPLANT
CATH INFINITI 5FR ANG PIGTAIL (CATHETERS) ×2 IMPLANT
CATH INFINITI JR4 5F (CATHETERS) ×1 IMPLANT
CATH OPTITORQUE TIG 4.0 5F (CATHETERS) ×2 IMPLANT
DEVICE RAD COMP TR BAND LRG (VASCULAR PRODUCTS) ×2 IMPLANT
GLIDESHEATH SLEND A-KIT 6F 22G (SHEATH) ×2 IMPLANT
GLIDESHEATH SLEND SS 6F .021 (SHEATH) IMPLANT
GUIDE CATH RUNWAY 6FR FR4 (CATHETERS) ×1 IMPLANT
KIT ENCORE 26 ADVANTAGE (KITS) ×1 IMPLANT
KIT HEART LEFT (KITS) ×2 IMPLANT
PACK CARDIAC CATHETERIZATION (CUSTOM PROCEDURE TRAY) ×2 IMPLANT
STENT SYNERGY DES 3.5X20 (Permanent Stent) ×1 IMPLANT
SYR MEDRAD MARK V 150ML (SYRINGE) ×2 IMPLANT
TRANSDUCER W/STOPCOCK (MISCELLANEOUS) ×2 IMPLANT
TUBING CIL FLEX 10 FLL-RA (TUBING) ×2 IMPLANT
WIRE ASAHI PROWATER 180CM (WIRE) ×1 IMPLANT
WIRE HI TORQ VERSACORE-J 145CM (WIRE) ×2 IMPLANT
WIRE SAFE-T 1.5MM-J .035X260CM (WIRE) ×2 IMPLANT

## 2015-01-16 NOTE — Interval H&P Note (Signed)
Cath Lab Visit (complete for each Cath Lab visit)  Clinical Evaluation Leading to the Procedure:   ACS: Yes.    Non-ACS:    Anginal Classification: CCS IV  Anti-ischemic medical therapy: No Therapy  Non-Invasive Test Results: No non-invasive testing performed  Prior CABG: No previous CABG      History and Physical Interval Note:  01/16/2015 3:27 PM  John Carrillo  has presented today for surgery, with the diagnosis of cp  The various methods of treatment have been discussed with the patient and family. After consideration of risks, benefits and other options for treatment, the patient has consented to  Procedure(s): Left Heart Cath and Coronary Angiography (N/A) as a surgical intervention .  The patient's history has been reviewed, patient examined, no change in status, stable for surgery.  I have reviewed the patient's chart and labs.  Questions were answered to the patient's satisfaction.     Nanetta BattyBerry, Praise Dolecki

## 2015-01-16 NOTE — Progress Notes (Signed)
  Echocardiogram 2D Echocardiogram has been performed.  Leta JunglingCooper, Corrina Steffensen M 01/16/2015, 1:30 PM

## 2015-01-16 NOTE — Progress Notes (Signed)
ANTICOAGULATION CONSULT NOTE - Follow Up Consult  Pharmacy Consult for Heparin  Indication: chest pain/ACS  No Known Allergies  Patient Measurements: Height: 5\' 9"  (175.3 cm) Weight: 180 lb 1.6 oz (81.693 kg) IBW/kg (Calculated) : 70.7  Vital Signs: Temp: 97.4 F (36.3 C) (12/11 2036) Temp Source: Oral (12/11 2036) BP: 111/78 mmHg (12/11 2036) Pulse Rate: 72 (12/11 2036)  Labs:  Recent Labs  01/14/15 2129 01/15/15 0244 01/15/15 0913 01/15/15 1610 01/15/15 1720 01/16/15 0030  HGB 16.2 14.1  --   --   --  13.3  HCT 47.5 42.2  --   --   --  39.4  PLT 211 147*  --   --   --  162  HEPARINUNFRC  --   --  0.27*  --  0.25* 0.30  CREATININE 1.38*  --  1.30*  --   --   --   TROPONINI  --  0.20* 0.19* 0.13*  --   --     Estimated Creatinine Clearance: 58.2 mL/min (by C-G formula based on Cr of 1.3).   Assessment: Heparin level on low end of therapeutic range after rate increase  Goal of Therapy:  Heparin level 0.3-0.7 units/ml Monitor platelets by anticoagulation protocol: Yes   Plan:  -Increase heparin to 1300 units/hr to prevent sub-therapeutic level -Confirmatory HL at 1000 -Daily CBC/HL -Monitor for bleeding  Abran DukeLedford, Taelar Gronewold 01/16/2015,1:26 AM

## 2015-01-16 NOTE — Progress Notes (Signed)
ANTICOAGULATION CONSULT NOTE - Follow Up Consult  Pharmacy Consult for Heparin  Indication: NSTEMI  No Known Allergies  Patient Measurements: Height: 5\' 9"  (175.3 cm) Weight: 180 lb 1.6 oz (81.693 kg) IBW/kg (Calculated) : 70.7  Vital Signs: Temp: 97.7 F (36.5 C) (12/12 0450) Temp Source: Oral (12/12 0450) BP: 113/82 mmHg (12/12 1002) Pulse Rate: 72 (12/12 0813)  Labs:  Recent Labs  01/14/15 2129 01/15/15 0244  01/15/15 0913 01/15/15 1610 01/15/15 1720 01/16/15 0030 01/16/15 0540 01/16/15 1043  HGB 16.2 14.1  --   --   --   --  13.3 13.8  --   HCT 47.5 42.2  --   --   --   --  39.4 41.9  --   PLT 211 147*  --   --   --   --  162 180  --   LABPROT  --   --   --   --   --   --   --  13.8  --   INR  --   --   --   --   --   --   --  1.04  --   HEPARINUNFRC  --   --   < > 0.27*  --  0.25* 0.30  --  0.58  CREATININE 1.38*  --   --  1.30*  --   --   --   --   --   TROPONINI  --  0.20*  --  0.19* 0.13*  --   --   --   --   < > = values in this interval not displayed.  Estimated Creatinine Clearance: 58.2 mL/min (by C-G formula based on Cr of 1.3).   Assessment: 64 yo male who presented to ED with chest pain. He was started on IV heparin for NSTEMI and plan is for cath today. Heparin level is therapeutic at 0.58 on 1300 units/hr. No bleeding noted, CBC is stable.  Goal of Therapy:  Heparin level 0.3-0.7 units/ml Monitor platelets by anticoagulation protocol: Yes   Plan:  -Continue heparin drip at 1300 units/hr -Daily heparin level and CBC -Monitor for s/sx of bleeding -F/u after cath  [facility] Hospital MckeesportJennifer Barton[place], Pharm.D., BCPS Clinical Pharmacist Pager: [phone] 01/16/2015 11:41 AM

## 2015-01-16 NOTE — Progress Notes (Signed)
Inpatient Diabetes Program Recommendations  AACE/ADA: New Consensus Statement on Inpatient Glycemic Control (2015)  Target Ranges:  Prepandial:   less than 140 mg/dL      Peak postprandial:   less than 180 mg/dL (1-2 hours)      Critically ill patients:  140 - 180 mg/dL   Review of Glycemic Control:  Results for Jethro BolusMORRIS, Sherwin A (MRN [id number]) as of 01/16/2015 12:16  Ref. Range 01/15/2015 06:11 01/15/2015 06:53 01/15/2015 11:35 01/15/2015 16:30 01/15/2015 21:08 01/16/2015 06:50 01/16/2015 11:27  Glucose-Capillary Latest Ref Range: 65-99 mg/dL 47 (L) 536157 (H) 92 644340 (H) 283 (H) 253 (H) 150 (H)  Results for Jethro BolusMORRIS, Bhavesh A (MRN [id number]) as of 01/16/2015 12:16  Ref. Range 10/28/2014 08:26 01/15/2015 05:50  Hemoglobin A1C Latest Ref Range: 4.8-5.6 % 7.8 9.3 (H)   Diabetes history: Type 1 diabetes per H&P Outpatient Diabetes medications: 70/30 Flexpen- 12 units in the AM and 25-30 units q PM Current orders for Inpatient glycemic control:  Novolog sensitive tid with meals and HS  Inpatient Diabetes Program Recommendations:   Note history of Type 1 diabetes per problem list.  Note that patient is NPO.  Please consider increasing frequency of Novolog correction to q 4 hours.  Also consider adding Lantus 20 units daily while NPO.   Thanks, Beryl MeagerJenny Gilmer Kaminsky, RN, BC-ADM Inpatient Diabetes Coordinator Pager [phone] (8a-5p)

## 2015-01-16 NOTE — Progress Notes (Signed)
Utilization review completed. Saverio Kader, RN, BSN. 

## 2015-01-16 NOTE — H&P (View-Only) (Signed)
    Primary cardiologist: Dr. Charlton HawsPeter Nishan (not seen in several years)  Patient admitted earlier this morning by the cardiology fellow. He has a reported history of LAD stenting greater than 10 years ago, large anterior wall infarct managed with lytic therapy and rescue PCI [facility] in 2003. He has not seen Dr. [name] for several years, had a recent visit with primary care in November with plan for follow-up stress testing arranged in January.  He developed fairly sudden onset "sharp" right lower thoracic discomfort yesterday evening while at a holiday party, this was followed by a feeling of warmth and diaphoresis, subsequently nausea. He never felt his heart racing, but on assessment in the ER was found to have a wide-complex tachycardia in the 150s to 160s. He was treated with Cardizem in the ER with subsequent conversion to sinus rhythm (I do not have the transition for review), and at baseline he has a left bundle-branch block when in sinus rhythm. At this point it is suspected that this was SVT. He does not report any prior history of cardiac arrhythmias.  He does mention what sounds like a cardiomyopathy, LVEF perhaps as low as the 30% range years ago. No recent follow-up for review. His cardiac enzymes are mildly abnormal with troponin I of 0.18 up to 0.20. He has had no further arrhythmias on telemetry monitoring, and is comfortable this morning without active symptoms.  Plan at this time is to manage him for NSTEMI, type I versus type II with SVT, and arrange for diagnostic cardiac catheterization tomorrow. Echocardiogram will be obtained to follow up on LVEF. Continue aspirin, Lipitor, Altace, and heparin. Add low-dose Coreg.   Jonelle SidleSamuel G. McDowell, M.D., F.A.C.C.

## 2015-01-17 ENCOUNTER — Inpatient Hospital Stay (HOSPITAL_COMMUNITY): Payer: 59

## 2015-01-17 ENCOUNTER — Encounter (HOSPITAL_COMMUNITY): Payer: Self-pay | Admitting: Cardiovascular Disease

## 2015-01-17 DIAGNOSIS — I447 Left bundle-branch block, unspecified: Secondary | ICD-10-CM | POA: Diagnosis present

## 2015-01-17 DIAGNOSIS — N183 Chronic kidney disease, stage 3 unspecified: Secondary | ICD-10-CM | POA: Diagnosis present

## 2015-01-17 DIAGNOSIS — I471 Supraventricular tachycardia: Secondary | ICD-10-CM

## 2015-01-17 DIAGNOSIS — I255 Ischemic cardiomyopathy: Secondary | ICD-10-CM | POA: Diagnosis present

## 2015-01-17 LAB — BASIC METABOLIC PANEL
ANION GAP: 9 (ref 5–15)
ANION GAP: 8 (ref 5–15)
BUN: 14 mg/dL (ref 6–20)
BUN: 17 mg/dL (ref 6–20)
CALCIUM: 9 mg/dL (ref 8.9–10.3)
CO2: 27 mmol/L (ref 22–32)
CO2: 27 mmol/L (ref 22–32)
Calcium: 8.4 mg/dL — ABNORMAL LOW (ref 8.9–10.3)
Chloride: 101 mmol/L (ref 101–111)
Chloride: 103 mmol/L (ref 101–111)
Creatinine, Ser: 1.37 mg/dL — ABNORMAL HIGH (ref 0.61–1.24)
Creatinine, Ser: 1.4 mg/dL — ABNORMAL HIGH (ref 0.61–1.24)
GFR calc Af Amer: >60 mL/min (ref >60)
GFR, EST NON AFRICAN AMERICAN: 53 mL/min — AB (ref >60)
GFR, EST NON AFRICAN AMERICAN: 52 mL/min — AB (ref >60)
Glucose, Bld: 278 mg/dL — ABNORMAL HIGH (ref 65–99)
Glucose, Bld: 342 mg/dL — ABNORMAL HIGH (ref 65–99)
POTASSIUM: 4.1 mmol/L (ref 3.5–5.1)
Potassium: 4.9 mmol/L (ref 3.5–5.1)
SODIUM: 137 mmol/L (ref 135–145)
Sodium: 138 mmol/L (ref 135–145)

## 2015-01-17 LAB — CBC
HCT: 39.5 % (ref 39.0–52.0)
Hemoglobin: 12.9 g/dL — ABNORMAL LOW (ref 13.0–17.0)
MCH: 32.1 pg (ref 26.0–34.0)
MCHC: 32.7 g/dL (ref 30.0–36.0)
MCV: 98.3 fL (ref 78.0–100.0)
PLATELETS: 167 10*3/uL (ref 150–400)
RBC: 4.02 MIL/uL — AB (ref 4.22–5.81)
RDW: 13.1 % (ref 11.5–15.5)
WBC: 8.1 10*3/uL (ref 4.0–10.5)

## 2015-01-17 LAB — GLUCOSE, CAPILLARY: Glucose-Capillary: 294 mg/dL — ABNORMAL HIGH (ref 65–99)

## 2015-01-17 MED ORDER — NITROGLYCERIN 0.4 MG SL SUBL: 0.4000 mg | SUBLINGUAL_TABLET | SUBLINGUAL | Status: DC | PRN

## 2015-01-17 MED ORDER — SPIRONOLACTONE 25 MG PO TABS
25.0000 mg | ORAL_TABLET | Freq: Every day | ORAL | Status: DC
  Administered 2015-01-17: 11:00:00 25 mg via ORAL
  Filled 2015-01-17: qty 1

## 2015-01-17 MED ORDER — ASPIRIN 81 MG PO CHEW: 81.0000 mg | CHEWABLE_TABLET | Freq: Every day | ORAL | Status: DC

## 2015-01-17 MED ORDER — LIVING WELL WITH DIABETES BOOK
Freq: Once | Status: AC
  Administered 2015-01-17: 1
  Filled 2015-01-17: qty 1

## 2015-01-17 MED ORDER — TICAGRELOR 90 MG PO TABS
90.0000 mg | ORAL_TABLET | Freq: Two times a day (BID) | ORAL | Status: DC
Start: 2015-01-17 — End: 2015-12-11

## 2015-01-17 MED ORDER — ANGIOPLASTY BOOK
Freq: Once | Status: AC
  Administered 2015-01-17: 1
  Filled 2015-01-17: qty 1

## 2015-01-17 MED ORDER — FUROSEMIDE 10 MG/ML IJ SOLN
40.0000 mg | Freq: Once | INTRAMUSCULAR | Status: AC
  Administered 2015-01-17: 40 mg via INTRAVENOUS
  Filled 2015-01-17: qty 4

## 2015-01-17 MED ORDER — ACETAMINOPHEN 325 MG PO TABS: 650.0000 mg | ORAL_TABLET | ORAL | Status: DC | PRN

## 2015-01-17 MED ORDER — CARVEDILOL 3.125 MG PO TABS: 6.2500 mg | ORAL_TABLET | Freq: Two times a day (BID) | ORAL | Status: DC

## 2015-01-17 MED ORDER — HEART ATTACK BOUNCING BOOK
Freq: Once | Status: AC
  Administered 2015-01-17: 04:00:00 1
  Filled 2015-01-17: qty 1

## 2015-01-17 MED ORDER — SPIRONOLACTONE 25 MG PO TABS: 25.0000 mg | ORAL_TABLET | Freq: Every day | ORAL | Status: DC

## 2015-01-17 MED ORDER — CARVEDILOL 6.25 MG PO TABS: 6.2500 mg | ORAL_TABLET | Freq: Two times a day (BID) | ORAL | Status: DC

## 2015-01-17 MED FILL — Heparin Sodium (Porcine) 2 Unit/ML in Sodium Chloride 0.9%: INTRAMUSCULAR | Qty: 1000 | Status: AC

## 2015-01-17 NOTE — Progress Notes (Signed)
CM spoke with pt via phone regarding Brilinta. CM called and confirmed [facility] @ [phone]. CM to  provide pt with Brilinta booklet with 30 day free card enclosed and copay card with explanation. Gae Gallopngela Zachry Hopfensperger RN,BSN,CM [phone]

## 2015-01-17 NOTE — Progress Notes (Addendum)
Patient had episode of short of breath during initial rounds  O2  saturation was 97%RA. Complained cannot catch his breath. Patient HR stable and BP. Breath sound bilaterally diminished lower base and slight fine crackles rt. Side. With coughing episode, placed oxygen and patient preferred sitting upright at the bedside. Had  A first dose of brilinta earlier and was given caffeine drink. Patient was relieved and able to rest and sleep. Dr. Delton SeeNelson made aware of patients symptoms will continue to monitor patient.

## 2015-01-17 NOTE — Progress Notes (Signed)
Inpatient Diabetes Program Recommendations  AACE/ADA: New Consensus Statement on Inpatient Glycemic Control (2015)  Target Ranges:  Prepandial:   less than 140 mg/dL      Peak postprandial:   less than 180 mg/dL (1-2 hours)      Critically ill patients:  140 - 180 mg/dL   Review of Glycemic Control  Diabetes history: DM 2 Outpatient Diabetes medications: 70/30 12 units QAM, 25-30 units QPM depending on CBG Current orders for Inpatient glycemic control: Novolog Sensitive TID + HS scale  Inpatient Diabetes Program Recommendations: Insulin - Basal: Glucose in the 200-300 range currently. Patient takes 70/30 at home with basal insulin component. If eating, please consider ordering 70/30 12 units QAM, and 25 units QPM.  Thanks,  Christena DeemShannon Early Steel RN, MSN, [facility] HospitalCCN Inpatient Diabetes Coordinator Team Pager [phone] (8a-5p)

## 2015-01-17 NOTE — Progress Notes (Signed)
CARDIAC REHAB PHASE I   PRE:  Rate/Rhythm: 78 Sr LBBB    BP: sitting 129/89    SaO2:   MODE:  Ambulation: 900 ft   POST:  Rate/Rhythm: 93 SR LBBB    BP: sitting 157/91     SaO2:   Pt tolerated well until walked up 30 degree incline while talking, became SOB and had to rest. Slowed pace coming back to room. Pt sts this is not his norm, he normally can do any activity he wants without SOB. Pt does sts he had 1 week PTA of feeling like he was drowning while lying down. Long discussion of MI, stent, Brilinta, risk factor modification, HF, daily wts, ex, NTG and CRPII. Voiced understanding. Will refer to CRPII [place] (pt did before). [id number]   Elissa LovettReeve, Ivor Kishi PleasantvilleKristan CES, ACSM 01/17/2015 10:13 AM

## 2015-01-17 NOTE — Progress Notes (Signed)
Subjective:  SOB this am, no chest pain  Objective:  Vital Signs in the last 24 hours: Temp:  [97.3 F (36.3 C)-97.7 F (36.5 C)] 97.3 F (36.3 C) (12/13 0230) Pulse Rate:  [0-85] 85 (12/13 0230) Resp:  [4-25] 22 (12/13 0230) BP: (113-159)/(78-107) 129/90 mmHg (12/13 0230) SpO2:  [0 %-100 %] 98 % (12/13 0230) Weight:  [181 lb 7 oz (82.3 kg)] 181 lb 7 oz (82.3 kg) (12/13 0230)  Intake/Output from previous day:  Intake/Output Summary (Last 24 hours) at 01/17/15 [id number] Last data filed at 01/16/15 2100  Gross per 24 hour  Intake 611.32 ml  Output      0 ml  Net 611.32 ml    Physical Exam: General appearance: alert, cooperative and no distress Lungs: clear to auscultation bilaterally Heart: regular rate and rhythm Extremities: Rt radial site without hematoma Neurologic: Grossly normal   Rate: 82  Rhythm: normal sinus rhythm, premature ventricular contractions (PVC) and LBBB, one couplet  Lab Results:  Recent Labs  01/16/15 0540 01/17/15 0320  WBC 7.2 8.1  HGB 13.8 12.9*  PLT 180 167    Recent Labs  01/15/15 0913 01/17/15 0320  NA 141 137  K 4.0 4.1  CL 107 101  CO2 28 27  GLUCOSE 84 278*  BUN 20 14  CREATININE 1.30* 1.37*    Recent Labs  01/15/15 0913 01/15/15 1610  TROPONINI 0.19* 0.13*    Recent Labs  01/16/15 0540  INR 1.04    Scheduled Meds: . aspirin  81 mg Oral Daily  . atorvastatin  40 mg Oral Daily  . carvedilol  3.125 mg Oral BID WC  . insulin aspart  0-5 Units Subcutaneous QHS  . insulin aspart  0-9 Units Subcutaneous TID WC  . ramipril  5 mg Oral BID  . sodium chloride  3 mL Intravenous Q12H  . ticagrelor  90 mg Oral BID   Continuous Infusions:  PRN Meds:.sodium chloride, acetaminophen, morphine injection, ondansetron (ZOFRAN) IV, sodium chloride   Imaging: Imaging results have been reviewed  Cardiac Studies: Echo 01/16/15 pending  Assessment/Plan:  64 year old male with a history of DM, HLD, and CAD. He was  formerly a patient of Dr. Fabio BeringNishan's. He has a history of anterior wall myocardial infarction 15 years ago [facility]  He was admitted on 01/14/15 after developing chest pain shortness of breath and diaphoresis holiday party. In ED he was noted to be in an supraventricular tachycardia with baseline left bundle-branch block. His enzymes were mildly elevated. He underwent coronary angiogram 12/12 which revealed a patent LAD stent and a high grade RCA stenosis. He received an RCA DES. His LVF was noted to be severly depressed. Dr Allyson SabalBerry has suggested an EP evaluation before discharge.  Principal Problem:   NSTEMI (non-ST elevated myocardial infarction) [facility](HCC) Active Problems:   CAD S/P LAD PCI'03, RCA DES 01/16/15   SVT (supraventricular tachycardia) (HCC)   Unstable angina (HCC)   Dyslipidemia   Type 2 diabetes mellitus with renal manifestations, controlled (HCC)   Cardiomyopathy, ischemic   Chronic renal disease, stage II   Essential hypertension   LBBB (left bundle branch block)   PLAN: I think his dyspnea is secondary to Brilinta. I have contacted EP. He recently increased his Lipitor to 40 mg -LDL 170, (Dr Everardo AllEllison follows).   Corine ShelterLuke Kilroy PA-C 01/17/2015, 8:12 AM [phone]  His exam has rales and he is in CHF currently.  Has LBBB and  wouild benefit from AICD and BiV pacing His EF was known to be low 37% by MRI 2008.  He was referred for AICD then and saw SK.  He decided Not to have an AICD.  I hope he does not have to wait 3 months and wear life vest given chronic nature of his Ischemic DCM>  Will give additional lasix today.  Ok to change brillinta CXR f/u  [employer]

## 2015-01-17 NOTE — Consult Note (Signed)
ELECTROPHYSIOLOGY CONSULT NOTE    Patient ID: John Carrillo MRN: [id number], DOB/AGE: [birth year] 64 y.o.  Admit date: 01/14/2015 Date of Consult: 01/17/2015  Primary Physician: Tana Conch, MD Primary Cardiologist: [name]  Reason for Consultation: evaluate for ICD  HPI:  John Carrillo is a 63 y.o. male with a past medical history significant for CAD, prior CVA, diabetes, hypertension, and hyperlipidemia.  He has a persistently decreased EF and was evaluated by Dr Graciela Husbands in 2008 for primary prevention ICD which he declined at that time.  He did well until the day of admission when he developed right sided chest pain with radiation into the shoulder, shortness of breath and palpitations. On arrival to the ER, he was found to be in SVT which terminated with Cardizem.  Catheterization yesterday demonstrated 95% stenosed mid RCA to distal RCA lesion that was successfully intervened on.  LVEF 25% by cath.  Echo pending.   He has not been seen by cardiology since 2008.  Home medications included ACE-I but no BB.   He currently denies chest pain, recent fevers, chills, nausea or vomiting. He states that he was unaware of tachycardia until he reached the ER and was told that he was in SVT. This is the first episode of tachycardia that he is aware of.   Past Medical History  Diagnosis Date  . CORONARY ARTERY DISEASE 08/11/2006  . CVA 12/19/2006    2008  . DIABETES MELLITUS, TYPE I 08/11/2006  . ECZEMA, HANDS 12/15/2009  . HYPERLIPIDEMIA 08/11/2006  . HYPERTENSION 08/11/2006  . MYOCARDIAL INFARCTION, HX OF 08/11/2006    07/14/01     Surgical History:  Past Surgical History  Procedure Laterality Date  . Tonsilectomy, adenoidectomy, bilateral myringotomy and tubes    . Coronary stent placement    . Cardiac catheterization N/A 01/16/2015    Procedure: Left Heart Cath and Coronary Angiography;  Surgeon: [name], MD;  Location: [facility] INVASIVE CV LAB;  Service: Cardiovascular;   Laterality: N/A;     Prescriptions prior to admission  Medication Sig Dispense Refill Last Dose  . aspirin 325 MG tablet Take 325 mg by mouth daily.     01/14/2015 at Unknown time  . atorvastatin (LIPITOR) 40 MG tablet Take 1 tablet (40 mg total) by mouth daily. 30 tablet 11 01/14/2015 at Unknown time  . ibuprofen (ADVIL,MOTRIN) 200 MG tablet Take 400 mg by mouth every 6 (six) hours as needed for moderate pain.    01/13/2015 at Unknown time  . nitroGLYCERIN (NITROSTAT) 0.4 MG SL tablet Place 1 tablet (0.4 mg total) under the tongue every 5 (five) minutes as needed. (Patient taking differently: Place 0.4 mg under the tongue every 5 (five) minutes as needed for chest pain. Chest pain) 30 tablet 5 01/14/2015 at Unknown time  . NOVOLOG MIX 70/30 FLEXPEN (70-30) 100 UNIT/ML FlexPen Inject 12-30 Units into the skin 2 (two) times daily with a meal. Inject 12 units in the morning and 25-30 units in the evening depending on blood glucose reading  1 01/14/2015 at 2000  . Probiotic Product (PROBIOTIC PO) Take 1 capsule by mouth every morning.   01/13/2015 at Unknown time  . ramipril (ALTACE) 5 MG capsule Take 1 capsule (5 mg total) by mouth 2 (two) times daily. 60 capsule 11 01/14/2015 at Unknown time  . glucose blood (ONETOUCH VERIO) test strip 1 each by Other route 4 (four) times daily. And lances 4/day E11.9 120 each 3 Taking  . Insulin Pen Needle (BD  ULTRA-FINE PEN NEEDLES) 29G X 12.7MM MISC use as directed twice a day 200 each 2 Taking  . Lancets (ACCU-CHEK MULTICLIX) lancets Use as instructed to check blood sugar four times daily 200 each 3 Taking    Inpatient Medications:  . aspirin  81 mg Oral Daily  . atorvastatin  40 mg Oral Daily  . carvedilol  6.25 mg Oral BID WC  . furosemide  40 mg Intravenous Once  . insulin aspart  0-5 Units Subcutaneous QHS  . insulin aspart  0-9 Units Subcutaneous TID WC  . ramipril  5 mg Oral BID  . sodium chloride  3 mL Intravenous Q12H  . spironolactone  25 mg Oral  Daily  . ticagrelor  90 mg Oral BID    Allergies: No Known Allergies  Social History   Social History  . Marital Status: Married    Spouse Name: N/A  . Number of Children: N/A  . Years of Education: N/A   Occupational History  . Writerroduction Manager [employer]   Social History Main Topics  . Smoking status: Former Smoker -- 1.00 packs/day for 30 years    Types: Cigarettes    Quit date: 05/03/2000  . Smokeless tobacco: Not on file  . Alcohol Use: 0.0 oz/week    0 Standard drinks or equivalent per week     Comment: 1-2/month  . Drug Use: No  . Sexual Activity: Not on file   Other Topics Concern  . Not on file   Social History Narrative   Married (kids are patients here, wife seen elsewhere). 3 step children, 1 biological. No grandkids.    In late 2016:   64 yo sophomore at [employer]- consider age 64 retirement   Oldest daughter - college of [employer]- social work intl business minor religion      Emergency planning/management officerroject manager for [employer]      Hobbies: race sail boats, adrenaline related activities     Family History  Problem Relation Age of Onset  . Diabetes Neg Hx   . CAD Father     age 956  . CAD Paternal Grandfather     2570s  . CAD Paternal Grandmother     1670s  . Brain cancer Maternal Grandmother      Review of Systems: All other systems reviewed and are otherwise negative except as noted above.  Physical Exam: Filed Vitals:   01/17/15 0100 01/17/15 0200 01/17/15 0230 01/17/15 0819  BP: 114/79 128/83 129/90 129/73  Pulse: 72 73 85 77  Temp:   97.3 F (36.3 C) 97.6 F (36.4 C)  TempSrc:   Oral Oral  Resp: 23 19 22 20   Height:      Weight:   181 lb 7 oz (82.3 kg)   SpO2: 99% 99% 98% 99%    GEN- The patient is well appearing, alert and oriented x 3 today.   HEENT: normocephalic, atraumatic; sclera clear, conjunctiva pink; hearing intact; oropharynx clear; neck supple  Lungs- Clear to ausculation bilaterally, normal work of breathing. Scattered  rales Heart- Regular rate and rhythm, no murmurs, rubs or gallops  GI- soft, non-tender, non-distended, bowel sounds present  Extremities- no clubbing, cyanosis, or edema; DP/PT/radial pulses 2+ bilaterally MS- no significant deformity or atrophy Skin- warm and dry, no rash or lesion Psych- euthymic mood, full affect Neuro- strength and sensation are intact  Labs:   Lab Results  Component Value Date   WBC 8.1 01/17/2015   HGB 12.9* 01/17/2015   HCT 39.5 01/17/2015  MCV 98.3 01/17/2015   PLT 167 01/17/2015     Recent Labs Lab 01/17/15 0320  NA 137  K 4.1  CL 101  CO2 27  BUN 14  CREATININE 1.37*  CALCIUM 8.4*  GLUCOSE 278*      Radiology/Studies: Dg Chest Portable 1 View 01/14/2015  CLINICAL DATA:  Chest pain. EXAM: PORTABLE CHEST 1 VIEW COMPARISON:  None. FINDINGS: Borderline cardiomegaly is noted. No pneumothorax or pleural effusion is noted. Right lung is clear. Minimal left basilar subsegmental atelectasis. The visualized skeletal structures are unremarkable. IMPRESSION: Minimal left basilar subsegmental atelectasis. Borderline cardiomegaly. Electronically Signed   By: Lupita Raider, M.D.   On: 01/14/2015 21:58    EKG: 01/14/15 short RP tachycardia, rate 170, LBBB 01/17/15 sinus rhythm, rate 69, LBBB, QRS 170  TELEMETRY: sinus rhythm with occasional PVC's  Assessment/Plan: 1.   ICM/CAD Status post RCA intervention this admission Medications have been optimized and beta blocker added this admission.  Per guidelines, would continue to up-titrate BB and ACE-I and reassess EF 90 days post revascularization. If EF remains depressed at that time, would recommend ICD. With LBBB and QRS >12msec, he would be expected to benefit from CRT therapy.  LifeVest could be considered, although the patient has had a depressed EF for several years without VT, syncope, or palpitations.  A thorough conversation was had with Jethro Bolus by an informed provider in which the true  expected benefit from wearable cardiac defibrillators based on current data was presented ["save rate" - 2.2% (1 shock/7.8 patient years) for patients with ischemic cardiomyopathy and 0% for patients with NICM based on recently published data].  He would like to discuss LifeVest further with EP MD.   2.  SVT - short RP tachycardia Terminated with Cardizem Up-titrate BB as able  3.  Chronic systolic heart failure Continue to optimize medications as above Will likely benefit from CRT with QRS >13msec as above  Repeat echo in 3 months. If EF remains depressed despite optimal medical therapy, would recommend CRTD at that time.  Dr [name] to see later today.    Signed, Gypsy Balsam, NP 01/17/2015 10:10 AM   I have seen, examined the patient, and reviewed the above assessment and plan.  On exam, RRR.  Changes to above are made where necessary.  Pt admitted with SVT (short RP) which terminated with adenosine.  S/p revascularization and not previously medically optimized.  I had a long discussion with patient and spouse (by phone) about importance of compliance and medical optimization.  Will need at least 3 months of adequate medical therapy before we could consider ICD implantation.  No indication for lifevest at this time.  Outpatient optimization of medicines by general cardiology If EF remains <35% after 90 days of optimization, I would be happy to see again to discuss ICD.    Co Sign: Hillis Range, MD 01/17/2015 10:27 AM

## 2015-01-17 NOTE — Discharge Summary (Signed)
Patient ID: John Carrillo,  MRN: [id number], DOB/AGE: [birth year] 64 y.o.  Admit date: 01/14/2015 Discharge date: 01/17/2015  Primary Care Provider: Tana Conch, MD Primary Cardiologist: Dr [name] Dr Allred  Discharge Diagnoses Principal Problem:   NSTEMI (non-ST elevated myocardial infarction) [facility]) Active Problems:   CAD S/P LAD PCI'03, RCA DES 01/16/15   SVT (supraventricular tachycardia) (HCC)   Unstable angina (HCC)   Dyslipidemia   Type 2 diabetes mellitus with renal manifestations, controlled (HCC)   Cardiomyopathy, ischemic   Chronic renal disease, stage II   Essential hypertension   LBBB (left bundle branch block)    Procedures: Coronary angiogram and RCA DES 01/16/15   Hospital Course:  64 year old male with a history of DM, HLD, and CAD. He was formerly a patient of Dr. Fabio Bering. He has a history of anterior wall myocardial infarction 15 years ago treated with PCI and stenting of the LAD via right radial approach at [facility]. He was admitted on 01/14/15 after developing chest pain, shortness of breath and diaphoresis at a holiday party. In the ED he was noted to be in an supraventricular tachycardia with baseline left bundle-branch block. He converted to NSR with Adenosine. His enzymes were mildly elevated. He underwent coronary angiogram 12/12 which revealed a patent LAD stent and a high grade RCA stenosis. He received an RCA DES. His LVF was noted to be severely depressed. Dr Allyson Sabal suggested an EP evaluation before discharge and the pt was seen by Dr [name]. ICD and Life Vest were discussed with the pt and his wife. The plan if for optimization of his medical therapy over the next 3 months and then reevaluation of LVF, Life Vest not recommended. He'll f/u with an APP in 7-14 days as a TOC pt and adjustments to his Coreg can be made at that time.    Discharge Vitals:  Blood pressure 129/73, pulse 77, temperature 97.6 F (36.4 C), temperature source Oral, resp.  rate 20, height  (1.753 m), weight 181 lb 7 oz (82.3 kg), SpO2 99 %.    Labs: Results for orders placed or performed during the hospital encounter of 01/14/15 (from the past 24 hour(s))  Glucose, capillary     Status: Abnormal   Collection Time: 01/16/15 11:27 AM  Result Value Ref Range   Glucose-Capillary 150 (H) 65 - 99 mg/dL   Comment 1 Notify RN   POCT Activated clotting time     Status: None   Collection Time: 01/16/15  4:20 PM  Result Value Ref Range   Activated Clotting Time 446 seconds  Glucose, capillary     Status: Abnormal   Collection Time: 01/16/15  5:49 PM  Result Value Ref Range   Glucose-Capillary 173 (H) 65 - 99 mg/dL  Glucose, capillary     Status: Abnormal   Collection Time: 01/16/15  9:41 PM  Result Value Ref Range   Glucose-Capillary 355 (H) 65 - 99 mg/dL   Comment 1 Notify RN    Comment 2 Document in Chart   CBC     Status: Abnormal   Collection Time: 01/17/15  3:20 AM  Result Value Ref Range   WBC 8.1 4.0 - 10.5 K/uL   RBC 4.02 (L) 4.22 - 5.81 MIL/uL   Hemoglobin 12.9 (L) 13.0 - 17.0 g/dL   HCT 46.9 62.9 - 52.8 %   MCV 98.3 78.0 - 100.0 fL   MCH 32.1 26.0 - 34.0 pg   MCHC 32.7 30.0 - 36.0 g/dL  RDW 13.1 11.5 - 15.5 %   Platelets 167 150 - 400 K/uL  Basic metabolic panel     Status: Abnormal   Collection Time: 01/17/15  3:20 AM  Result Value Ref Range   Sodium 137 135 - 145 mmol/L   Potassium 4.1 3.5 - 5.1 mmol/L   Chloride 101 101 - 111 mmol/L   CO2 27 22 - 32 mmol/L   Glucose, Bld 278 (H) 65 - 99 mg/dL   BUN 14 6 - 20 mg/dL   Creatinine, Ser 1.611.37 (H) 0.61 - 1.24 mg/dL   Calcium 8.4 (L) 8.9 - 10.3 mg/dL   GFR calc non Af Amer 53 (L) >60 mL/min   GFR calc Af Amer >60 >60 mL/min   Anion gap 9 5 - 15  Glucose, capillary     Status: Abnormal   Collection Time: 01/17/15  6:37 AM  Result Value Ref Range   Glucose-Capillary 294 (H) 65 - 99 mg/dL   Comment 1 Notify RN    Comment 2 Document in Chart     Disposition:      Follow-up  Information    Follow up with Hillis RangeJames Allred, MD.   Specialty:  Cardiology   Why:  office will contact you    Contact information:   [address] [place] [address] [phone]       Follow up with Charlton HawsPeter Nishan, MD.   Specialty:  Cardiology   Why:  office will contact you to see Dr [name] or an NP/PA in 1-2 weeks   Contact information:   [address] [place] [address] [phone]       Discharge Medications:    Medication List    STOP taking these medications        accu-chek multiclix lancets     aspirin 325 MG tablet  Replaced by:  aspirin 81 MG chewable tablet      TAKE these medications        acetaminophen 325 MG tablet  Commonly known as:  TYLENOL  Take 2 tablets (650 mg total) by mouth every 4 (four) hours as needed for headache or mild pain.     aspirin 81 MG chewable tablet  Chew 1 tablet (81 mg total) by mouth daily.     atorvastatin 40 MG tablet  Commonly known as:  LIPITOR  Take 1 tablet (40 mg total) by mouth daily.     carvedilol 6.25 MG tablet  Commonly known as:  COREG  Take 1 tablet (6.25 mg total) by mouth 2 (two) times daily with a meal.     glucose blood test strip  Commonly known as:  ONETOUCH VERIO  1 each by Other route 4 (four) times daily. And lances 4/day E11.9     ibuprofen 200 MG tablet  Commonly known as:  ADVIL,MOTRIN  Take 400 mg by mouth every 6 (six) hours as needed for moderate pain.     Insulin Pen Needle 29G X 12.7MM Misc  Commonly known as:  BD ULTRA-FINE PEN NEEDLES  use as directed twice a day     nitroGLYCERIN 0.4 MG SL tablet  Commonly known as:  NITROSTAT  Place 1 tablet (0.4 mg total) under the tongue every 5 (five) minutes as needed for chest pain. Chest pain     NOVOLOG MIX 70/30 FLEXPEN (70-30) 100 UNIT/ML FlexPen  Generic drug:  insulin aspart protamine - aspart  Inject 12-30 Units into the skin 2 (two) times daily  with a meal. Inject 12 units in the morning and  25-30 units in the evening depending on blood glucose reading     PROBIOTIC PO  Take 1 capsule by mouth every morning.     ramipril 5 MG capsule  Commonly known as:  ALTACE  Take 1 capsule (5 mg total) by mouth 2 (two) times daily.     spironolactone 25 MG tablet  Commonly known as:  ALDACTONE  Take 1 tablet (25 mg total) by mouth daily.     ticagrelor 90 MG Tabs tablet  Commonly known as:  BRILINTA  Take 1 tablet (90 mg total) by mouth 2 (two) times daily.         Duration of Discharge Encounter: Greater than 30 minutes including physician time.  Jolene Provost PA-C 01/17/2015 11:08 AM

## 2015-01-17 NOTE — Discharge Instructions (Signed)
Cardiomyopathy Cardiomyopathy is a long-term (chronic) disease of the heart muscle (myocardium). Over time, the heart becomes abnormally large, thick, or stiff. This makes it harder for the heart to pump blood and can lead to heart failure. There are several types of cardiomyopathy:  Dilated cardiomyopathy. This type causes the ventricles become large and weak.  Hypertrophic cardiomyopathy. This type causes the heart muscle to thicken.  Restrictive cardiomyopathy. This type causes the heart muscle to become rigid and less elastic.  Ischemic cardiomyopathy. This type involves narrowing arteries that cause the walls of the heart get thinner.  Peripartum cardiomyopathy. This type occurs during pregnancy or shortly after pregnancy. CAUSES The cause of cardiomyopathy is often not known. In some cases, it is passed down (inherited) from a family member who also had cardiomyopathy. The disease may develop as a complication of another medical condition. These conditions can include:  Diabetes.  High blood pressure.  Viral infection of the heart.  Heart attack.  Coronary heart disease. RISK FACTORS You may be more likely to develop cardiomyopathy if you:  Have a family history of cardiomyopathy or other heart problems.  Are overweight or obese.  Use illegal drugs.  Abuse alcohol.  Have diabetes.  Have another disease that can cause cardiomyopathy as a complication. SIGNS AND SYMPTOMS Often, cardiomyopathy has no signs or symptoms. If you do have symptoms, they may include:  Shortness of breath, especially during activity.  Fatigue.  An irregular heartbeat (arrhythmia).  Dizziness, light-headedness, or fainting.  Chest pain.  Swelling in the lower leg or ankle. DIAGNOSIS Your health care provider may suspect cardiomyopathy based on your symptoms and medical history. Your health care provider will also do a physical exam. Other tests done may include:  Blood  tests.  Imaging studies of your heart. These may be done using:  X-rays to check if your heart is enlarged.  Echocardiogram to show the size of your heart and how well it pumps.  MRI.  A test to record the electrical activity of your heart (electrocardiogram or ECG).  A test in which you wear a portable device (event monitor) to record your heart's electrical activity while you go about your day.  A test to monitor your heart's activity while you exercise (stress test).   A procedure to check the blood pressure and blood flow in your heart(cardiac catheterization).  Injection of dye into your arteries before imaging studies are taken (angiogram).  Removal of a sample of heart tissue (biopsy). The sample is examined for problems. TREATMENT Treatment depends on the type of cardiomyopathy you have and the severity of your symptoms. If you are not having any symptoms, you might not need treatment. If you need treatment, it may include:  Lifestyle changes.  Quit smoking, if you smoke.  Maintain a healthy weight. Lose weight if directed by your health care provider.  Eat a healthy diet. Include plenty of fruits, vegetables, and whole grains.  Get regular exercise. Ask your health care provider to suggest some activities that are good for you.  Medicine. You may need to take medicine to:  Lower your blood pressure.  Slow down your heart rate.  Keep your heart beating in a steady rhythm.  Clear excess fluids from your body.  Prevent blood clots.  Surgery. You may need surgery to:  Repair a defect.  Remove thickened tissue.  Implant a device to treat serious heart rhythm problems (implantable cardioverter-defibrillator or ICD).  Replace your heart (heart transplant) if all other treatments  have failed (end stage). HOME CARE INSTRUCTIONS  Take medicines only as directed by your health care provider.  Eat a heart-healthy diet. Work with your health care provider or a  registered dietitian to learn about healthy eating options.  Maintain a healthy weight and stay physically active.  Do not use any tobacco products, including cigarettes, chewing tobacco, or electronic cigarettes. If you need help quitting, ask your health care provider.  Work closely with your health care provider to manage chronic conditions, such as diabetes and high blood pressure.  Limit alcohol intake to no more than one drink per day for nonpregnant women and no more than two drinks per day for men. One drink equals 12 ounces of beer, 5 ounces of wine, or 1 ounces of hard liquor.  Try to get at least 7 hours of sleep each night.  Find ways to manage stress.  Keep all follow-up visits as directed by your health care provider. This is important. SEEK MEDICAL CARE IF:  Your symptoms get worse, even after treatment.  You have new symptoms. SEEK IMMEDIATE MEDICAL CARE IF:  You have severe chest pain.  You have shortness of breath.  You cough up pink, bubbly material.  You have sudden sweating.  You feel nauseous and vomit.  You suddenly become light-headed or dizzy.  You feel your heart beating very fast.  It feels like your heart is skipping beats. These symptoms may represent a serious problem that is an emergency. Do not wait to see if the symptoms will go away. Get medical help right away. Call your local emergency services (911 in the [place] Do not drive yourself to the hospital.   This information is not intended to replace advice given to you by your health care provider. Make sure you discuss any questions you have with your health care provider.   Document Released: 04/05/2004 Document Revised: 02/11/2014 Document Reviewed: 07/01/2013 Elsevier Interactive Patient Education 2016 [employer] Coronary Angiogram With Stent, Care After Refer to this sheet in the next few weeks. These instructions provide you with information about caring for yourself after your  procedure. Your health care provider may also give you more specific instructions. Your treatment has been planned according to current medical practices, but problems sometimes occur. Call your health care provider if you have any problems or questions after your procedure. WHAT TO EXPECT AFTER THE PROCEDURE  After your procedure, it is typical to have the following:  Bruising at the catheter insertion site that usually fades within 1-2 weeks.  Blood collecting in the tissue (hematoma) that may be painful to the touch. It should usually decrease in size and tenderness within 1-2 weeks. HOME CARE INSTRUCTIONS  Take medicines only as directed by your health care provider. Blood thinners may be prescribed after your procedure to improve blood flow through the stent.  You may shower 24-48 hours after the procedure or as directed by your health care provider. Remove the bandage (dressing) and gently wash the catheter insertion site with plain soap and water. Pat the area dry with a clean towel. Do not rub the site, because this may cause bleeding.  Do not take baths, swim, or use a hot tub until your health care provider approves.  Check your catheter insertion site every day for redness, swelling, or drainage.  Do not apply powder or lotion to the site.  Do not lift over 10 lb (4.5 kg) for 5 days after your procedure or as directed by your health  care provider.  Ask your health care provider when it is okay to:  Return to work or school.  Resume usual physical activities or sports.  Resume sexual activity.  Eat a heart-healthy diet. This should include plenty of fresh fruits and vegetables. Meat should be lean cuts. Avoid the following types of food:  Food that is high in salt.  Canned or highly processed food.  Food that is high in saturated fat or sugar.  Fried food.  Make any other lifestyle changes as recommended by your health care provider. These may include:  Not using  any tobacco products, including cigarettes, chewing tobacco, or electronic cigarettes.If you need help quitting, ask your health care provider.  Managing your weight.  Getting regular exercise.  Managing your blood pressure.  Limiting your alcohol intake.  Managing other health problems, such as diabetes.  If you need an MRI after your heart stent has been placed, be sure to tell the health care provider who orders the MRI that you have a heart stent.  Keep all follow-up visits as directed by your health care provider. This is important. SEEK MEDICAL CARE IF:  You have a fever.  You have chills.  You have increased bleeding from the catheter insertion site. Hold pressure on the site. SEEK IMMEDIATE MEDICAL CARE IF:  You develop chest pain or shortness of breath, feel faint, or pass out.  You have unusual pain at the catheter insertion site.  You have redness, warmth, or swelling at the catheter insertion site.  You have drainage (other than a small amount of blood on the dressing) from the catheter insertion site.  The catheter insertion site is bleeding, and the bleeding does not stop after 30 minutes of holding steady pressure on the site.  You develop bleeding from any other place, such as from your rectum. There may be bright red blood in your urine or stool, or it may appear as black, tarry stool.   This information is not intended to replace advice given to you by your health care provider. Make sure you discuss any questions you have with your health care provider.   Document Released: 08/10/2004 Document Revised: 02/11/2014 Document Reviewed: 06/15/2012 Elsevier Interactive Patient Education [employer]

## 2015-01-24 ENCOUNTER — Emergency Department (HOSPITAL_COMMUNITY): Payer: 59

## 2015-01-24 ENCOUNTER — Encounter (HOSPITAL_COMMUNITY): Payer: Self-pay | Admitting: Emergency Medicine

## 2015-01-24 ENCOUNTER — Inpatient Hospital Stay (HOSPITAL_COMMUNITY)
Admission: EM | Admit: 2015-01-24 | Discharge: 2015-01-26 | DRG: 065 | Disposition: A | Discharge status: Home / Self Care | Payer: 59 | Attending: Internal Medicine | Admitting: Internal Medicine

## 2015-01-24 DIAGNOSIS — E875 Hyperkalemia: Secondary | ICD-10-CM | POA: Diagnosis present

## 2015-01-24 DIAGNOSIS — Z8249 Family history of ischemic heart disease and other diseases of the circulatory system: Secondary | ICD-10-CM

## 2015-01-24 DIAGNOSIS — R402362 Coma scale, best motor response, obeys commands, at arrival to emergency department: Secondary | ICD-10-CM | POA: Diagnosis present

## 2015-01-24 DIAGNOSIS — E1065 Type 1 diabetes mellitus with hyperglycemia: Secondary | ICD-10-CM | POA: Diagnosis present

## 2015-01-24 DIAGNOSIS — E785 Hyperlipidemia, unspecified: Secondary | ICD-10-CM | POA: Diagnosis present

## 2015-01-24 DIAGNOSIS — E1165 Type 2 diabetes mellitus with hyperglycemia: Secondary | ICD-10-CM

## 2015-01-24 DIAGNOSIS — G43809 Other migraine, not intractable, without status migrainosus: Secondary | ICD-10-CM | POA: Diagnosis present

## 2015-01-24 DIAGNOSIS — I63411 Cerebral infarction due to embolism of right middle cerebral artery: Principal | ICD-10-CM | POA: Diagnosis present

## 2015-01-24 DIAGNOSIS — I255 Ischemic cardiomyopathy: Secondary | ICD-10-CM | POA: Diagnosis present

## 2015-01-24 DIAGNOSIS — R7309 Other abnormal glucose: Secondary | ICD-10-CM

## 2015-01-24 DIAGNOSIS — I130 Hypertensive heart and chronic kidney disease with heart failure and stage 1 through stage 4 chronic kidney disease, or unspecified chronic kidney disease: Secondary | ICD-10-CM | POA: Diagnosis present

## 2015-01-24 DIAGNOSIS — I447 Left bundle-branch block, unspecified: Secondary | ICD-10-CM | POA: Diagnosis present

## 2015-01-24 DIAGNOSIS — I2510 Atherosclerotic heart disease of native coronary artery without angina pectoris: Secondary | ICD-10-CM

## 2015-01-24 DIAGNOSIS — I252 Old myocardial infarction: Secondary | ICD-10-CM

## 2015-01-24 DIAGNOSIS — H5442 Blindness, left eye, normal vision right eye: Secondary | ICD-10-CM | POA: Diagnosis present

## 2015-01-24 DIAGNOSIS — Z9861 Coronary angioplasty status: Secondary | ICD-10-CM

## 2015-01-24 DIAGNOSIS — R402252 Coma scale, best verbal response, oriented, at arrival to emergency department: Secondary | ICD-10-CM | POA: Diagnosis present

## 2015-01-24 DIAGNOSIS — E119 Type 2 diabetes mellitus without complications: Secondary | ICD-10-CM | POA: Diagnosis present

## 2015-01-24 DIAGNOSIS — E669 Obesity, unspecified: Secondary | ICD-10-CM | POA: Diagnosis present

## 2015-01-24 DIAGNOSIS — H5462 Unqualified visual loss, left eye, normal vision right eye: Secondary | ICD-10-CM | POA: Diagnosis present

## 2015-01-24 DIAGNOSIS — I10 Essential (primary) hypertension: Secondary | ICD-10-CM | POA: Diagnosis present

## 2015-01-24 DIAGNOSIS — Z955 Presence of coronary angioplasty implant and graft: Secondary | ICD-10-CM

## 2015-01-24 DIAGNOSIS — N189 Chronic kidney disease, unspecified: Secondary | ICD-10-CM | POA: Diagnosis present

## 2015-01-24 DIAGNOSIS — N179 Acute kidney failure, unspecified: Secondary | ICD-10-CM | POA: Diagnosis present

## 2015-01-24 DIAGNOSIS — E1022 Type 1 diabetes mellitus with diabetic chronic kidney disease: Secondary | ICD-10-CM | POA: Diagnosis present

## 2015-01-24 DIAGNOSIS — H5440 Blindness, one eye, unspecified eye: Secondary | ICD-10-CM

## 2015-01-24 DIAGNOSIS — Z6825 Body mass index (BMI) 25.0-25.9, adult: Secondary | ICD-10-CM

## 2015-01-24 DIAGNOSIS — Z7982 Long term (current) use of aspirin: Secondary | ICD-10-CM

## 2015-01-24 DIAGNOSIS — Z79899 Other long term (current) drug therapy: Secondary | ICD-10-CM

## 2015-01-24 DIAGNOSIS — I5022 Chronic systolic (congestive) heart failure: Secondary | ICD-10-CM | POA: Diagnosis present

## 2015-01-24 DIAGNOSIS — Z87891 Personal history of nicotine dependence: Secondary | ICD-10-CM

## 2015-01-24 DIAGNOSIS — Z794 Long term (current) use of insulin: Secondary | ICD-10-CM

## 2015-01-24 DIAGNOSIS — I639 Cerebral infarction, unspecified: Secondary | ICD-10-CM | POA: Diagnosis present

## 2015-01-24 DIAGNOSIS — R402142 Coma scale, eyes open, spontaneous, at arrival to emergency department: Secondary | ICD-10-CM | POA: Diagnosis present

## 2015-01-24 DIAGNOSIS — Z8673 Personal history of transient ischemic attack (TIA), and cerebral infarction without residual deficits: Secondary | ICD-10-CM | POA: Diagnosis present

## 2015-01-24 DIAGNOSIS — N183 Chronic kidney disease, stage 3 (moderate): Secondary | ICD-10-CM | POA: Diagnosis present

## 2015-01-24 LAB — CBC
HEMATOCRIT: 44.1 % (ref 39.0–52.0)
Hemoglobin: 14.7 g/dL (ref 13.0–17.0)
MCH: 32.8 pg (ref 26.0–34.0)
MCHC: 33.3 g/dL (ref 30.0–36.0)
MCV: 98.4 fL (ref 78.0–100.0)
PLATELETS: 232 10*3/uL (ref 150–400)
RBC: 4.48 MIL/uL (ref 4.22–5.81)
RDW: 13.2 % (ref 11.5–15.5)
WBC: 7.2 10*3/uL (ref 4.0–10.5)

## 2015-01-24 LAB — DIFFERENTIAL
BASOS ABS: 0 10*3/uL (ref 0.0–0.1)
Basophils Relative: 1 %
Eosinophils Absolute: 0.4 10*3/uL (ref 0.0–0.7)
Eosinophils Relative: 6 %
LYMPHS PCT: 29 %
Lymphs Abs: 2.1 10*3/uL (ref 0.7–4.0)
MONO ABS: 0.8 10*3/uL (ref 0.1–1.0)
Monocytes Relative: 12 %
NEUTROS ABS: 3.8 10*3/uL (ref 1.7–7.7)
Neutrophils Relative %: 52 %

## 2015-01-24 LAB — COMPREHENSIVE METABOLIC PANEL
ALK PHOS: 60 U/L (ref 38–126)
ALT: 38 U/L (ref 17–63)
AST: 30 U/L (ref 15–41)
Albumin: 3.5 g/dL (ref 3.5–5.0)
Anion gap: 8 (ref 5–15)
BUN: 20 mg/dL (ref 6–20)
CALCIUM: 9.4 mg/dL (ref 8.9–10.3)
CO2: 28 mmol/L (ref 22–32)
CREATININE: 1.86 mg/dL — AB (ref 0.61–1.24)
Chloride: 99 mmol/L — ABNORMAL LOW (ref 101–111)
GFR calc non Af Amer: 37 mL/min — ABNORMAL LOW (ref >60)
GFR, EST AFRICAN AMERICAN: 43 mL/min — AB (ref >60)
GLUCOSE: 551 mg/dL — AB (ref 65–99)
Potassium: 5.5 mmol/L — ABNORMAL HIGH (ref 3.5–5.1)
SODIUM: 135 mmol/L (ref 135–145)
Total Bilirubin: 0.8 mg/dL (ref 0.3–1.2)
Total Protein: 6.1 g/dL — ABNORMAL LOW (ref 6.5–8.1)

## 2015-01-24 LAB — I-STAT CHEM 8, ED
BUN: 24 mg/dL — AB (ref 6–20)
CALCIUM ION: 1.15 mmol/L (ref 1.13–1.30)
CHLORIDE: 96 mmol/L — AB (ref 101–111)
CREATININE: 1.8 mg/dL — AB (ref 0.61–1.24)
GLUCOSE: 483 mg/dL — AB (ref 65–99)
HCT: 48 % (ref 39.0–52.0)
Hemoglobin: 16.3 g/dL (ref 13.0–17.0)
POTASSIUM: 5.7 mmol/L — AB (ref 3.5–5.1)
Sodium: 135 mmol/L (ref 135–145)
TCO2: 30 mmol/L (ref 0–100)

## 2015-01-24 LAB — APTT: aPTT: 25 seconds (ref 24–37)

## 2015-01-24 LAB — I-STAT TROPONIN, ED: Troponin i, poc: 0.04 ng/mL (ref 0.00–0.08)

## 2015-01-24 LAB — PROTIME-INR
INR: 0.94 (ref 0.00–1.49)
Prothrombin Time: 12.8 seconds (ref 11.6–15.2)

## 2015-01-24 MED ORDER — SODIUM CHLORIDE 0.9 % IV BOLUS (SEPSIS): 500.0000 mL | Freq: Once | INTRAVENOUS | Status: DC

## 2015-01-24 MED ORDER — INSULIN ASPART 100 UNIT/ML ~~LOC~~ SOLN
10.0000 [IU] | Freq: Once | SUBCUTANEOUS | Status: AC
  Administered 2015-01-24: 10 [IU] via INTRAVENOUS
  Filled 2015-01-24: qty 1

## 2015-01-24 MED ORDER — SODIUM CHLORIDE 0.9 % IV BOLUS (SEPSIS)
1000.0000 mL | Freq: Once | INTRAVENOUS | Status: AC
  Administered 2015-01-24: 1000 mL via INTRAVENOUS

## 2015-01-24 MED ORDER — SODIUM POLYSTYRENE SULFONATE 15 GM/60ML PO SUSP: 15.0000 g | Freq: Once | ORAL | Status: DC

## 2015-01-24 NOTE — ED Notes (Signed)
Pt states normal peripheral vision on right side, but decreased on left. Peripheral vision only out to approximately 45 degrees.

## 2015-01-24 NOTE — ED Notes (Signed)
MD at bedside. 

## 2015-01-24 NOTE — ED Notes (Signed)
Dr. Seymore at the bedside.  

## 2015-01-24 NOTE — ED Provider Notes (Signed)
CSN: [id number]     Arrival date & time 01/24/15  2052 History   First MD Initiated Contact with Patient 01/24/15 2104     Chief Complaint  Patient presents with  . Code Stroke    The patient said he lost vision in his left eye about an hour ago.  The patient also complained of a headache on the left side of his head rating it 5/10.       (Consider location/radiation/quality/duration/timing/severity/associated sxs/prior Treatment) Patient is a 64 y.o. male presenting with eye problem. The history is provided by the patient.  Eye Problem Location:  L eye Quality: blindness. Severity:  Severe Onset quality:  Sudden Duration:  1 hour Timing:  Constant Progression:  Partially resolved Chronicity:  New Context: not contact lens problem, not direct trauma, not using machinery, not scratch and not smoke exposure   Relieved by:  None tried Worsened by:  Nothing tried Ineffective treatments:  None tried Associated symptoms: blurred vision, decreased vision, headaches and numbness   Associated symptoms: no crusting, no discharge, no double vision, no facial rash, no foreign body sensation, no inflammation, no itching, no nausea, no photophobia, no redness, no scotomas, no swelling, no tearing, no tingling, no vomiting and no weakness   Risk factors: no previous injury to eye and no recent URI     Past Medical History  Diagnosis Date  . CORONARY ARTERY DISEASE 08/11/2006  . CVA 12/19/2006    2008  . DIABETES MELLITUS, TYPE I 08/11/2006  . ECZEMA, HANDS 12/15/2009  . HYPERLIPIDEMIA 08/11/2006  . HYPERTENSION 08/11/2006  . MYOCARDIAL INFARCTION, HX OF 08/11/2006    07/14/01   Past Surgical History  Procedure Laterality Date  . Tonsilectomy, adenoidectomy, bilateral myringotomy and tubes    . Coronary stent placement    . Cardiac catheterization N/A 01/16/2015    Procedure: Left Heart Cath and Coronary Angiography;  Surgeon: [name], MD;  Location: [facility] INVAS[facility] CV LAB;  Service:  Cardiovascular;  Laterality: N/A;   Family History  Problem Relation Age of Onset  . Diabetes Neg Hx   . CAD Father     age 62  . CAD Paternal Grandfather     60s  . CAD Paternal Grandmother     43s  . Brain cancer Maternal Grandmother    Social History  Substance Use Topics  . Smoking status: Former Smoker -- 1.00 packs/day for 30 years    Types: Cigarettes    Quit date: 05/03/2000  . Smokeless tobacco: None  . Alcohol Use: 0.0 oz/week    0 Standard drinks or equivalent per week     Comment: 1-2/month    Review of Systems  Constitutional: Negative for fever, appetite change and fatigue.  HENT: Negative for ear pain and trouble swallowing.   Eyes: Positive for blurred vision and visual disturbance. Negative for double vision, photophobia, pain, discharge, redness and itching.  Respiratory: Negative for chest tightness and shortness of breath.   Cardiovascular: Negative for chest pain.  Gastrointestinal: Negative for nausea, vomiting, diarrhea and constipation.  Genitourinary: Negative for dysuria.  Musculoskeletal: Negative for myalgias, back pain and neck pain.  Skin: Negative for rash and wound.  Neurological: Positive for numbness and headaches. Negative for dizziness, tingling, seizures, syncope, facial asymmetry, weakness and light-headedness.  Psychiatric/Behavioral: Negative for confusion and agitation.      Allergies  Review of patient's allergies indicates no known allergies.  Home Medications   Prior to Admission medications   Medication Sig Start  Date End Date Taking? Authorizing Provider  acetaminophen (TYLENOL) 325 MG tablet Take 2 tablets (650 mg total) by mouth every 4 (four) hours as needed for headache or mild pain. 01/17/15  Yes Abelino Derrick, PA-C  aspirin 81 MG chewable tablet Chew 1 tablet (81 mg total) by mouth daily. 01/17/15  Yes Luke K Kilroy, PA-C  atorvastatin (LIPITOR) 40 MG tablet Take 1 tablet (40 mg total) by mouth daily. 06/24/14  Yes  [name], MD  carvedilol (COREG) 6.25 MG tablet Take 1 tablet (6.25 mg total) by mouth 2 (two) times daily with a meal. 01/17/15  Yes Abelino Derrick, PA-C  ibuprofen (ADVIL,MOTRIN) 200 MG tablet Take 400 mg by mouth every 6 (six) hours as needed for moderate pain.    Yes Historical Provider, MD  nitroGLYCERIN (NITROSTAT) 0.4 MG SL tablet Place 1 tablet (0.4 mg total) under the tongue every 5 (five) minutes as needed for chest pain. Chest pain 01/17/15  Yes Luke K Kilroy, PA-C  NOVOLOG MIX 70/30 FLEXPEN (70-30) 100 UNIT/ML FlexPen Inject 12-30 Units into the skin 2 (two) times daily with a meal. Inject 12 units in the morning and 25-30 units in the evening depending on blood glucose reading 12/19/14  Yes Historical Provider, MD  Probiotic Product (PROBIOTIC PO) Take 1 capsule by mouth every morning.   Yes Historical Provider, MD  ramipril (ALTACE) 5 MG capsule Take 1 capsule (5 mg total) by mouth 2 (two) times daily. 06/24/14  Yes [name], MD  spironolactone (ALDACTONE) 25 MG tablet Take 1 tablet (25 mg total) by mouth daily. 01/17/15  Yes Abelino Derrick, PA-C  ticagrelor (BRILINTA) 90 MG TABS tablet Take 1 tablet (90 mg total) by mouth 2 (two) times daily. 01/17/15  Yes Luke K Kilroy, PA-C  glucose blood (ONETOUCH VERIO) test strip 1 each by Other route 4 (four) times daily. And lances 4/day E11.9 12/19/14   Romero Belling, MD  Insulin Pen Needle (BD ULTRA-FINE PEN NEEDLES) 29G X 12.7MM MISC use as directed twice a day 09/03/13   Romero Belling, MD   BP 98/67 mmHg  Pulse 55  Temp(Src) 97.3 F (36.3 C) (Oral)  Resp 13  SpO2 96% Physical Exam  Constitutional: He is oriented to person, place, and time. He appears well-developed and well-nourished. No distress.  HENT:  Head: Normocephalic and atraumatic.  Eyes: Conjunctivae and EOM are normal. Pupils are equal, round, and reactive to light. Right eye exhibits no chemosis, no discharge and no exudate. Left eye exhibits no chemosis and no  discharge.  Neck: Normal range of motion. Neck supple.  Cardiovascular: Normal rate and regular rhythm.  Exam reveals no gallop and no friction rub.   No murmur heard. Pulmonary/Chest: Effort normal and breath sounds normal. No respiratory distress. He has no wheezes. He has no rales.  Abdominal: Soft. Bowel sounds are normal. He exhibits no distension and no mass. There is no tenderness. There is no rebound and no guarding.  Musculoskeletal: Normal range of motion.  Neurological: He is alert and oriented to person, place, and time. He has normal strength. No cranial nerve deficit or sensory deficit. Coordination normal. GCS eye subscore is 4. GCS verbal subscore is 5. GCS motor subscore is 6.  Pt with normal finger to nose and heel to shin exam. Vision change resolved by time of my evaluation.  Skin: Skin is warm and dry. He is not diaphoretic.  Psychiatric: He has a normal mood and affect. His behavior is  normal.    ED Course  Procedures (including critical care time) Labs Review Labs Reviewed  COMPREHENSIVE METABOLIC PANEL - Abnormal; Notable for the following:    Potassium 5.5 (*)    Chloride 99 (*)    Glucose, Bld 551 (*)    Creatinine, Ser 1.86 (*)    Total Protein 6.1 (*)    GFR calc non Af Amer 37 (*)    GFR calc Af Amer 43 (*)    All other components within normal limits  I-STAT CHEM 8, ED - Abnormal; Notable for the following:    Potassium 5.7 (*)    Chloride 96 (*)    BUN 24 (*)    Creatinine, Ser 1.80 (*)    Glucose, Bld 483 (*)    All other components within normal limits  PROTIME-INR  APTT  CBC  DIFFERENTIAL  URINALYSIS, ROUTINE W REFLEX MICROSCOPIC (NOT AT [facility])  I-STAT TROPOININ, ED  CBG MONITORING, ED    Imaging Review Ct Head Wo Contrast  01/24/2015  CLINICAL DATA:  Left visual loss with left-sided headaches EXAM: CT HEAD WITHOUT CONTRAST TECHNIQUE: Contiguous axial images were obtained from the base of the skull through the vertex without intravenous  contrast. COMPARISON:  12/13/2006 FINDINGS: Bony calvarium is intact. Changes of prior ischemia are noted in the left parietal lobe posteriorly. No acute areas of ischemia are noted. No hemorrhage or space-occupying mass lesion is seen. IMPRESSION: Chronic changes without acute abnormality. Electronically Signed   By: Alcide Clever M.D.   On: 01/24/2015 21:48   I have personally reviewed and evaluated these images and lab results as part of my medical decision-making.   EKG Interpretation None      MDM   Final diagnoses:  Blindness of left eye      64 year old Caucasian male with past medical history of CVA, coronary artery disease with stent placement last week currently on Brilinta who presents in the setting of left eye blindness. Patient reports he was at home playing games with family when he had sudden onset of left eye blindness with no prodromal symptoms. Patient reports this lasted for 10-15 minutes before he was brought to the department for further evaluation. On arrival to emergency department patient reported he had some return of vision mostly in temporal side. He reports this vision was blurred. By the time of my evaluation patient reported improvement in all visual fields with mild blurring as compared to opposite side. No significant difference noted on visual acuity examination. Patient did report mild headache on left side of head and some mild numbness around his eye during the event. The symptoms are improving at this time. Patient denies trauma, chest pain, shortness of breath, medication noncompliance, any numbness or tingling in hands or feet or any slurred speech or confusion.  Considering these findings differential diagnoses includes TIA, CRAO, other intraocular abnormality. As patient's symptoms are resolving this time this is likely TIA versus CRAO. We'll obtain full stroke workup including CT head and laboratory analysis. Additionally since patient had recent stent  placement we'll obtain EKG.  EKG was relatively unchanged from previous EKGs and no significant peaked T waves. Patient noted to be hyperkalemic and have acute kidney injury on laboratory analysis. Additionally patient noted to be hyperglycemic. Patient does have history of diabetes. Considering this finding patient given IV fluids and IV insulin. Patient's CT head did not reveal any acute signs of CVA. Laboratory analysis otherwise not abnormal. Due to patient's presentation consistent with likely TIA  neurology was consult. Neurology recommended patient be admitted for further inpatient workup for TIA. Additionally recommended ophthalmology be contacted.  I have discussed case with ophthalmology and they will assess patient inpatient. I discussed case with hospitalist and patient will be admitted to medicine for further TIA workup. Patient stable at time of admission.  Attending has seen and evaluated patient and Dr. Denton LankSteinl is in agreement with plan.  Stacy GardnerAndrew Alvis Pulcini, MD 01/25/15 [id number]  Cathren LaineKevin Steinl, MD 01/30/15 1106

## 2015-01-24 NOTE — Consult Note (Signed)
Consult Reason for Consult: left-eye visual deficit  Referring Physician: Dr Denton Lank  CC: vision loss in left eye  HPI: John Carrillo is an 64 y.o. male hx of CAD, prior CVA, DM, HLD, HTN presenting with sudden change in vision in his left eye. He notes sudden loss of vision in his left eye. Reports vision loss was the majority of the temporal visual field with a crescent shaped intact visual field in his nasal field of the left eye. Reports intact vision and visual fields in his right eye. Denies any associated weakness, sensory or speech deficits. Symptoms lasted around 1-2 hours and is now back to his baseline. Had recent heart cath and stent placement 1 week ago. Had 2D echo on 12/12 showing LV EF of 15% with diffuse hypokinesis. No eye pain, no visual auras during the episode. No jaw claudication, no temporal pain.   Also had transient left periorbital pressure headache during this episode. Not his typical migraine headache. Patient does have a history of migraine with visual aura though he has never had a visual deficit during a migraine.   CT head imaging reviewed, no acute process noted.   Past Medical History  Diagnosis Date  . CORONARY ARTERY DISEASE 08/11/2006  . CVA 12/19/2006    2008  . DIABETES MELLITUS, TYPE I 08/11/2006  . ECZEMA, HANDS 12/15/2009  . HYPERLIPIDEMIA 08/11/2006  . HYPERTENSION 08/11/2006  . MYOCARDIAL INFARCTION, HX OF 08/11/2006    07/14/01    Past Surgical History  Procedure Laterality Date  . Tonsilectomy, adenoidectomy, bilateral myringotomy and tubes    . Coronary stent placement    . Cardiac catheterization N/A 01/16/2015    Procedure: Left Heart Cath and Coronary Angiography;  Surgeon: [name], MD;  Location: [facility] INVASIVE CV LAB;  Service: Cardiovascular;  Laterality: N/A;    Family History  Problem Relation Age of Onset  . Diabetes Neg Hx   . CAD Father     age 72  . CAD Paternal Grandfather     2s  . CAD Paternal Grandmother     8s   . Brain cancer Maternal Grandmother     Social History:  reports that he quit smoking about 14 years ago. His smoking use included Cigarettes. He has a 30 pack-year smoking history. He does not have any smokeless tobacco history on file. He reports that he drinks alcohol. He reports that he does not use illicit drugs.  No Known Allergies  Medications: I have reviewed the patient's current medications.  ROS: Out of a complete 14 system review, the patient complains of only the following symptoms, and all other reviewed systems are negative. +visual field cut  Physical Examination: Filed Vitals:   01/24/15 2315 01/24/15 2330  BP: 116/74 113/78  Pulse: 50 57  Temp:    Resp:  19   Physical Exam  Constitutional: He appears well-developed and well-nourished.  Psych: Affect appropriate to situation Eyes: No scleral injection HENT: No OP obstrucion Head: Normocephalic.  Cardiovascular: Normal rate and regular rhythm.  Respiratory: Effort normal and breath sounds normal.  GI: Soft. Bowel sounds are normal. No distension. There is no tenderness.  Skin: WDI  Neurologic Examination Mental Status: Alert, oriented, thought content appropriate.  Speech fluent without evidence of aphasia.  Able to follow 3 step commands without difficulty. Cranial Nerves: II: optic discs difficult to fully visualize but no apparent defect noted, visual fields grossly normal, pupils equal, round, reactive to light and accommodation III,IV, VI: ptosis  not present, extra-ocular motions intact bilaterally V,VII: smile symmetric, facial light touch sensation normal bilaterally VIII: hearing normal bilaterally IX,X: gag reflex present XI: trapezius strength/neck flexion strength normal bilaterally XII: tongue strength normal  Motor: Right : Upper extremity    Left:     Upper extremity 5/5 deltoid       5/5 deltoid 5/5 biceps      5/5 biceps  5/5 triceps      5/5 triceps 5/5 hand grip      5/5 hand  grip  Lower extremity     Lower extremity 5/5 hip flexor      5/5 hip flexor 5/5 quadricep      5/5 quadriceps  5/5 hamstrings     5/5 hamstrings 5/5 plantar flexion       5/5 plantar flexion 5/5 plantar extension     5/5 plantar extension Tone and bulk:normal tone throughout; no atrophy noted Sensory: Pinprick and light touch intact throughout, bilaterally Deep Tendon Reflexes: 2+ and symmetric throughout Plantars: Right: downgoing   Left: downgoing Cerebellar: normal finger-to-nose, and normal heel-to-shin test Gait: deferred  Laboratory Studies:   Basic Metabolic Panel:  Recent Labs Lab 01/24/15 2116 01/24/15 2118  NA 135 135  K 5.7* 5.5*  CL 96* 99*  CO2  --  28  GLUCOSE 483* 551*  BUN 24* 20  CREATININE 1.80* 1.86*  CALCIUM  --  9.4    Liver Function Tests:  Recent Labs Lab 01/24/15 2118  AST 30  ALT 38  ALKPHOS 60  BILITOT 0.8  PROT 6.1*  ALBUMIN 3.5   No results for input(s): LIPASE, AMYLASE in the last 168 hours. No results for input(s): AMMONIA in the last 168 hours.  CBC:  Recent Labs Lab 01/24/15 2116 01/24/15 2118  WBC  --  7.2  NEUTROABS  --  3.8  HGB 16.3 14.7  HCT 48.0 44.1  MCV  --  98.4  PLT  --  232    Cardiac Enzymes: No results for input(s): CKTOTAL, CKMB, CKMBINDEX, TROPONINI in the last 168 hours.  BNP: Invalid input(s): POCBNP  CBG: No results for input(s): GLUCAP in the last 168 hours.  Microbiology: No results found for this or any previous visit.  Coagulation Studies:  Recent Labs  01/24/15 2118  LABPROT 12.8  INR 0.94    Urinalysis: No results for input(s): COLORURINE, LABSPEC, PHURINE, GLUCOSEU, HGBUR, BILIRUBINUR, KETONESUR, PROTEINUR, UROBILINOGEN, NITRITE, LEUKOCYTESUR in the last 168 hours.  Invalid input(s): APPERANCEUR  Lipid Panel:     Component Value Date/Time   CHOL 252* 06/24/2014 1140   TRIG 94.0 06/24/2014 1140   HDL 61.70 06/24/2014 1140   CHOLHDL 4 06/24/2014 1140   VLDL 18.8  06/24/2014 1140   LDLCALC 172* 06/24/2014 1140    HgbA1C:  Lab Results  Component Value Date   HGBA1C 9.3* 01/15/2015    Urine Drug Screen:  No results found for: LABOPIA, COCAINSCRNUR, LABBENZ, AMPHETMU, THCU, LABBARB  Alcohol Level: No results for input(s): ETH in the last 168 hours.  Other results:  Imaging: Ct Head Wo Contrast  01/24/2015  CLINICAL DATA:  Left visual loss with left-sided headaches EXAM: CT HEAD WITHOUT CONTRAST TECHNIQUE: Contiguous axial images were obtained from the base of the skull through the vertex without intravenous contrast. COMPARISON:  12/13/2006 FINDINGS: Bony calvarium is intact. Changes of prior ischemia are noted in the left parietal lobe posteriorly. No acute areas of ischemia are noted. No hemorrhage or space-occupying mass lesion is seen. IMPRESSION: Chronic  changes without acute abnormality. Electronically Signed   By: Alcide Clever M.D.   On: 01/24/2015 21:48     Assessment/Plan:  64y/o gentleman with history of HTN, HLD, DM, CAD, prior CVA presenting with transient episode of visual deficit in his left eye. Currently asymptomatic. With multiple risk factors cannot rule out TIA. Atypical history for occular migraine. ED discussed case with on-call ophthalmology.   -MRI brain and MRA head -carotid doppler -HbA1c of 9.3 on 12/11 -check lipid panel -continue ASA and Brilinta   Elspeth Cho, DO [facility] [phone]  If 7pm- 7am, please page neurology on call as listed in AMION. 01/24/2015, 11:47 PM

## 2015-01-24 NOTE — ED Notes (Signed)
Patient allowed to eat per Dr. Lonia MadSeymore.

## 2015-01-24 NOTE — ED Notes (Signed)
The patient said he lost vision in his left eye about an hour ago.  The patient also complained of a headache on the left side of his head rating it 5/10.   The patient denies any other  Symptoms and says the vision is getting better.  He also said he just had a stent placed on Monday.  He does have a history of previous stroke but no residual.

## 2015-01-24 NOTE — ED Notes (Signed)
Dr Freida BusmanAllen discussed putting pt straight into room. While code stoke is activated, MD stated symptoms were already resolving. Pt to CT now.

## 2015-01-24 NOTE — ED Notes (Signed)
Called Dr. Denton LankSteinl and reported cbg of 456, preparing to go to CT.

## 2015-01-24 NOTE — ED Notes (Signed)
Critical Lab  551 CBG per lab. Notified MD.

## 2015-01-24 NOTE — ED Notes (Signed)
Pt CBG of 456 mg/dL

## 2015-01-24 NOTE — ED Notes (Signed)
Dr. Steinl at the bedside.  

## 2015-01-24 NOTE — ED Provider Notes (Signed)
MSE was initiated and I personally evaluated the patient and placed orders (if any) at  9:11 PM on January 24, 2015.  The patient appears stable so that the remainder of the MSE may be completed by another provider.  Patient here with sudden onset of left eye blindness approximate one hour prior to arrival. His vision has improved now to mostly involve the temporal aspect of his left visual field. Care has been given to the next team  Lorre NickAnthony Drevin Ortner, MD 01/24/15 2112

## 2015-01-25 ENCOUNTER — Encounter: Payer: Self-pay | Admitting: Ophthalmology

## 2015-01-25 ENCOUNTER — Encounter (HOSPITAL_COMMUNITY): Payer: 59

## 2015-01-25 ENCOUNTER — Inpatient Hospital Stay (HOSPITAL_COMMUNITY): Payer: 59

## 2015-01-25 DIAGNOSIS — Z8673 Personal history of transient ischemic attack (TIA), and cerebral infarction without residual deficits: Secondary | ICD-10-CM | POA: Diagnosis not present

## 2015-01-25 DIAGNOSIS — I2510 Atherosclerotic heart disease of native coronary artery without angina pectoris: Secondary | ICD-10-CM | POA: Diagnosis present

## 2015-01-25 DIAGNOSIS — H5442 Blindness, left eye, normal vision right eye: Secondary | ICD-10-CM | POA: Diagnosis present

## 2015-01-25 DIAGNOSIS — I252 Old myocardial infarction: Secondary | ICD-10-CM | POA: Diagnosis not present

## 2015-01-25 DIAGNOSIS — E1065 Type 1 diabetes mellitus with hyperglycemia: Secondary | ICD-10-CM | POA: Diagnosis present

## 2015-01-25 DIAGNOSIS — E785 Hyperlipidemia, unspecified: Secondary | ICD-10-CM | POA: Diagnosis present

## 2015-01-25 DIAGNOSIS — Z955 Presence of coronary angioplasty implant and graft: Secondary | ICD-10-CM

## 2015-01-25 DIAGNOSIS — N189 Chronic kidney disease, unspecified: Secondary | ICD-10-CM

## 2015-01-25 DIAGNOSIS — E875 Hyperkalemia: Secondary | ICD-10-CM | POA: Diagnosis present

## 2015-01-25 DIAGNOSIS — Z87891 Personal history of nicotine dependence: Secondary | ICD-10-CM | POA: Diagnosis not present

## 2015-01-25 DIAGNOSIS — R402142 Coma scale, eyes open, spontaneous, at arrival to emergency department: Secondary | ICD-10-CM | POA: Diagnosis present

## 2015-01-25 DIAGNOSIS — I6320 Cerebral infarction due to unspecified occlusion or stenosis of unspecified precerebral arteries: Secondary | ICD-10-CM

## 2015-01-25 DIAGNOSIS — R402252 Coma scale, best verbal response, oriented, at arrival to emergency department: Secondary | ICD-10-CM | POA: Diagnosis present

## 2015-01-25 DIAGNOSIS — I340 Nonrheumatic mitral (valve) insufficiency: Secondary | ICD-10-CM | POA: Diagnosis not present

## 2015-01-25 DIAGNOSIS — I5022 Chronic systolic (congestive) heart failure: Secondary | ICD-10-CM | POA: Diagnosis present

## 2015-01-25 DIAGNOSIS — E1165 Type 2 diabetes mellitus with hyperglycemia: Secondary | ICD-10-CM | POA: Diagnosis not present

## 2015-01-25 DIAGNOSIS — Z794 Long term (current) use of insulin: Secondary | ICD-10-CM

## 2015-01-25 DIAGNOSIS — Z7982 Long term (current) use of aspirin: Secondary | ICD-10-CM | POA: Diagnosis not present

## 2015-01-25 DIAGNOSIS — E1022 Type 1 diabetes mellitus with diabetic chronic kidney disease: Secondary | ICD-10-CM | POA: Diagnosis present

## 2015-01-25 DIAGNOSIS — I639 Cerebral infarction, unspecified: Secondary | ICD-10-CM | POA: Diagnosis present

## 2015-01-25 DIAGNOSIS — N183 Chronic kidney disease, stage 3 (moderate): Secondary | ICD-10-CM | POA: Diagnosis present

## 2015-01-25 DIAGNOSIS — R402362 Coma scale, best motor response, obeys commands, at arrival to emergency department: Secondary | ICD-10-CM | POA: Diagnosis present

## 2015-01-25 DIAGNOSIS — H5462 Unqualified visual loss, left eye, normal vision right eye: Secondary | ICD-10-CM

## 2015-01-25 DIAGNOSIS — G43809 Other migraine, not intractable, without status migrainosus: Secondary | ICD-10-CM | POA: Diagnosis present

## 2015-01-25 DIAGNOSIS — Z6825 Body mass index (BMI) 25.0-25.9, adult: Secondary | ICD-10-CM | POA: Diagnosis not present

## 2015-01-25 DIAGNOSIS — I447 Left bundle-branch block, unspecified: Secondary | ICD-10-CM | POA: Diagnosis present

## 2015-01-25 DIAGNOSIS — E119 Type 2 diabetes mellitus without complications: Secondary | ICD-10-CM | POA: Diagnosis present

## 2015-01-25 DIAGNOSIS — Z8249 Family history of ischemic heart disease and other diseases of the circulatory system: Secondary | ICD-10-CM | POA: Diagnosis not present

## 2015-01-25 DIAGNOSIS — N179 Acute kidney failure, unspecified: Secondary | ICD-10-CM | POA: Diagnosis present

## 2015-01-25 DIAGNOSIS — I130 Hypertensive heart and chronic kidney disease with heart failure and stage 1 through stage 4 chronic kidney disease, or unspecified chronic kidney disease: Secondary | ICD-10-CM | POA: Diagnosis present

## 2015-01-25 DIAGNOSIS — I63411 Cerebral infarction due to embolism of right middle cerebral artery: Secondary | ICD-10-CM | POA: Diagnosis present

## 2015-01-25 DIAGNOSIS — E669 Obesity, unspecified: Secondary | ICD-10-CM | POA: Diagnosis present

## 2015-01-25 DIAGNOSIS — I255 Ischemic cardiomyopathy: Secondary | ICD-10-CM | POA: Diagnosis present

## 2015-01-25 DIAGNOSIS — H5440 Blindness, one eye, unspecified eye: Secondary | ICD-10-CM | POA: Insufficient documentation

## 2015-01-25 DIAGNOSIS — I25119 Atherosclerotic heart disease of native coronary artery with unspecified angina pectoris: Secondary | ICD-10-CM

## 2015-01-25 DIAGNOSIS — Z79899 Other long term (current) drug therapy: Secondary | ICD-10-CM | POA: Diagnosis not present

## 2015-01-25 HISTORY — DX: Acute kidney failure, unspecified: N17.9

## 2015-01-25 HISTORY — DX: Acute kidney failure, unspecified: N18.9

## 2015-01-25 LAB — URINALYSIS, ROUTINE W REFLEX MICROSCOPIC
Bilirubin Urine: NEGATIVE
HGB URINE DIPSTICK: NEGATIVE
Ketones, ur: NEGATIVE mg/dL
Leukocytes, UA: NEGATIVE
Nitrite: NEGATIVE
Protein, ur: NEGATIVE mg/dL
SPECIFIC GRAVITY, URINE: 1.014 (ref 1.005–1.030)
pH: 6.5 (ref 5.0–8.0)

## 2015-01-25 LAB — BASIC METABOLIC PANEL
Anion gap: 6 (ref 5–15)
BUN: 23 mg/dL — AB (ref 6–20)
CHLORIDE: 106 mmol/L (ref 101–111)
CO2: 27 mmol/L (ref 22–32)
CREATININE: 1.41 mg/dL — AB (ref 0.61–1.24)
Calcium: 8.6 mg/dL — ABNORMAL LOW (ref 8.9–10.3)
GFR calc Af Amer: 60 mL/min — ABNORMAL LOW (ref >60)
GFR calc non Af Amer: 52 mL/min — ABNORMAL LOW (ref >60)
GLUCOSE: 207 mg/dL — AB (ref 65–99)
Potassium: 4.2 mmol/L (ref 3.5–5.1)
Sodium: 139 mmol/L (ref 135–145)

## 2015-01-25 LAB — GLUCOSE, CAPILLARY
GLUCOSE-CAPILLARY: 198 mg/dL — AB (ref 65–99)
GLUCOSE-CAPILLARY: 115 mg/dL — AB (ref 65–99)
GLUCOSE-CAPILLARY: 377 mg/dL — AB (ref 65–99)
Glucose-Capillary: 152 mg/dL — ABNORMAL HIGH (ref 65–99)
Glucose-Capillary: 255 mg/dL — ABNORMAL HIGH (ref 65–99)

## 2015-01-25 LAB — LIPID PANEL
CHOL/HDL RATIO: 3.1 ratio
CHOLESTEROL: 170 mg/dL (ref 0–200)
HDL: 55 mg/dL (ref >40)
LDL Cholesterol: 102 mg/dL — ABNORMAL HIGH (ref 0–99)
Triglycerides: 63 mg/dL (ref <150)
VLDL: 13 mg/dL (ref 0–40)

## 2015-01-25 LAB — URINE MICROSCOPIC-ADD ON
RBC / HPF: NONE SEEN RBC/hpf (ref 0–5)
Squamous Epithelial / LPF: NONE SEEN

## 2015-01-25 MED ORDER — STROKE: EARLY STAGES OF RECOVERY BOOK
Freq: Once | Status: AC
  Administered 2015-01-25: 01:00:00
  Filled 2015-01-25 (×2): qty 1

## 2015-01-25 MED ORDER — ASPIRIN 81 MG PO CHEW
81.0000 mg | CHEWABLE_TABLET | Freq: Every day | ORAL | Status: DC
  Administered 2015-01-25: 81 mg via ORAL
  Filled 2015-01-25 (×2): qty 1

## 2015-01-25 MED ORDER — CARVEDILOL 6.25 MG PO TABS
6.2500 mg | ORAL_TABLET | Freq: Two times a day (BID) | ORAL | Status: DC
  Administered 2015-01-25 – 2015-01-26 (×3): 6.25 mg via ORAL
  Filled 2015-01-25 (×3): qty 1

## 2015-01-25 MED ORDER — INSULIN ASPART PROT & ASPART (70-30 MIX) 100 UNIT/ML ~~LOC~~ SUSP
25.0000 [IU] | Freq: Every day | SUBCUTANEOUS | Status: DC
  Administered 2015-01-25: 25 [IU] via SUBCUTANEOUS
  Filled 2015-01-25 (×2): qty 10

## 2015-01-25 MED ORDER — INSULIN ASPART PROT & ASPART (70-30 MIX) 100 UNIT/ML ~~LOC~~ SUSP
12.0000 [IU] | Freq: Every day | SUBCUTANEOUS | Status: DC
  Administered 2015-01-25 – 2015-01-26 (×2): 12 [IU] via SUBCUTANEOUS
  Filled 2015-01-25: qty 10

## 2015-01-25 MED ORDER — NITROGLYCERIN 0.4 MG SL SUBL: 0.4000 mg | SUBLINGUAL_TABLET | SUBLINGUAL | Status: DC | PRN

## 2015-01-25 MED ORDER — SACCHAROMYCES BOULARDII 250 MG PO CAPS
250.0000 mg | ORAL_CAPSULE | Freq: Every morning | ORAL | Status: DC
  Administered 2015-01-25 – 2015-01-26 (×2): 250 mg via ORAL
  Filled 2015-01-25 (×2): qty 1

## 2015-01-25 MED ORDER — ACETAMINOPHEN 325 MG PO TABS: 650.0000 mg | ORAL_TABLET | ORAL | Status: DC | PRN

## 2015-01-25 MED ORDER — INSULIN ASPART 100 UNIT/ML ~~LOC~~ SOLN: 0.0000 [IU] | Freq: Three times a day (TID) | SUBCUTANEOUS | Status: DC

## 2015-01-25 MED ORDER — SODIUM CHLORIDE 0.9 % IV SOLN
INTRAVENOUS | Status: AC
  Administered 2015-01-25: 01:00:00 via INTRAVENOUS

## 2015-01-25 MED ORDER — ENOXAPARIN SODIUM 40 MG/0.4ML ~~LOC~~ SOLN
40.0000 mg | SUBCUTANEOUS | Status: DC
  Administered 2015-01-25: 40 mg via SUBCUTANEOUS
  Filled 2015-01-25 (×2): qty 0.4

## 2015-01-25 MED ORDER — ATORVASTATIN CALCIUM 40 MG PO TABS
40.0000 mg | ORAL_TABLET | Freq: Every day | ORAL | Status: DC
  Administered 2015-01-25 – 2015-01-26 (×2): 40 mg via ORAL
  Filled 2015-01-25 (×2): qty 1

## 2015-01-25 MED ORDER — TICAGRELOR 90 MG PO TABS
90.0000 mg | ORAL_TABLET | Freq: Two times a day (BID) | ORAL | Status: DC
  Administered 2015-01-25 – 2015-01-26 (×3): 90 mg via ORAL
  Filled 2015-01-25 (×4): qty 1

## 2015-01-25 NOTE — Progress Notes (Signed)
Patient Demographics  John Carrillo, is a 64 y.o. male, DOB - Apr 10, 1950, ZOX:[id number]  Admit date - 01/24/2015   Admitting Physician Clydie Braun, MD  Outpatient Primary MD for the patient is Tana Conch, MD  LOS - 0   Chief Complaint  Patient presents with  . Code Stroke    The patient said he lost vision in his left eye about an hour ago.  The patient also complained of a headache on the left side of his head rating it 5/10.         Admission HPI/Brief narrative: 64 y.o. male with hx of CAD, prior CVA, DM, HLD, HTN,  presenting with sudden vision loss in his left eye, MRI with evidence of acute CVA in right occipital lobe.  Subjective:   John Carrillo today has, No headache, No chest pain, No abdominal pain - No Nausea, reports vision back at baseline, No Cough - SOB.   Assessment & Plan    Principal Problem:   Visual loss, left eye Active Problems:   Hyperkalemia   Diabetes mellitus with hyperglycemia (HCC)   Acute kidney injury superimposed on chronic kidney disease (HCC)   Stroke (HCC)  Acute CVA - MRI with evidence of acute CVA in nondominant right occipital lobe infarct in the setting of previous L MCA cortical infarcts. - Neurology consult appreciated, felt to be embolic secondary to low EF , plan is for TEE in a.m. by cardiology. - MRI brain acute right occipital lobe and old L MCA territory infarct - MRA distal right PCA occlusion - 2-D echo, done recently, EF 15% - Weighted Doppler pending - In for TEE in a.m. to look for embolic source. - If TEE is negative, will need EP consult for loop recorder. - LDL 102 - Hemoglobin A1c 9.3 - On aspirin and Brilinta.  Hypertension - Continue with home medication  Hyperlipidemia - On Lipitor  CAD - Recent stent placement, continue with aspirin and Brilinta.  Ischemic cardiomyopathy EF 15% - Continue with beta  blockers, hold Aldactone, ramipril, given worsening renal function.  Acute on chronic kidney disease stage III - Continue to hold nephrotoxic medication including NSAIDs, ramipril, Aldactone, isn't IV contrast with cardiac cath.  Diabetes mellitus - GEN with insulin 70/30   Code Status: Full  Family Communication: none at bedside  Disposition Plan: Pending further workup   Procedures  None   Consults   Neurology   Medications  Scheduled Meds: . aspirin  81 mg Oral Daily  . atorvastatin  40 mg Oral Daily  . carvedilol  6.25 mg Oral BID WC  . enoxaparin (LOVENOX) injection  40 mg Subcutaneous Q24H  . insulin aspart protamine- aspart  12 Units Subcutaneous Q breakfast  . insulin aspart protamine- aspart  25 Units Subcutaneous Q supper  . saccharomyces boulardii  250 mg Oral q morning - 10a  . ticagrelor  90 mg Oral BID   Continuous Infusions:  PRN Meds:.acetaminophen, nitroGLYCERIN  DVT Prophylaxis  Lovenox   Lab Results  Component Value Date   PLT 232 01/24/2015    Antibiotics    Anti-infectives    None          Objective:   Filed Vitals:   01/25/15 0232 01/25/15  0303 01/25/15 0502 01/25/15 0900  BP: 103/71 110/66 100/66 97/59  Pulse: 60 54 62 54  Temp: 97.7 F (36.5 C) 97.5 F (36.4 C) 97.6 F (36.4 C) 97.8 F (36.6 C)  TempSrc: Oral Oral Oral Oral  Resp: Weight:  78.472 kg (173 lb)    SpO2: 98% 100% 98% 99%    Wt Readings from Last 3 Encounters:  01/25/15 78.472 kg (173 lb)  01/17/15 82.3 kg (181 lb 7 oz)  12/23/14 80.74 kg (178 lb)     Intake/Output Summary (Last 24 hours) at 01/25/15 1303 Last data filed at 01/25/15 0950  Gross per 24 hour  Intake   1480 ml  Output    500 ml  Net    980 ml     Physical Exam  Awake Alert, Oriented X 3 [place].AT,PERRAL Supple Neck,No JVD Symmetrical Chest wall movement, Good air movement bilaterally, CTAB RRR,No Gallops,Rubs or new Murmurs +ve B.Sounds, Abd Soft, No tenderness,  No organomegaly appriciated, No rebound - guarding or rigidity. No Cyanosis, Clubbing or edema, No new Rash or bruise    Data Review   Micro Results No results found for this or any previous visit (from the past 240 hour(s)).  Radiology Reports Dg Chest 2 View  01/17/2015  CLINICAL DATA:  CHF, recent coronary stent, acute shortness of breath EXAM: CHEST  2 VIEW COMPARISON:  01/14/2015 FINDINGS: mild cardiac enlargement without CHF. Small pleural effusions noted with slight increased bibasilar atelectasis compared 01/14/2015. No pneumothorax. Trachea midline. Atherosclerosis of the aorta. No acute osseous finding. IMPRESSION: Small bilateral pleural effusions with increased bibasilar atelectasis. Borderline cardiomegaly without CHF. Electronically Signed   By: Judie Petit.  Shick M.D.   On: 01/17/2015 11:01   Ct Head Wo Contrast  01/24/2015  CLINICAL DATA:  Left visual loss with left-sided headaches EXAM: CT HEAD WITHOUT CONTRAST TECHNIQUE: Contiguous axial images were obtained from the base of the skull through the vertex without intravenous contrast. COMPARISON:  12/13/2006 FINDINGS: Bony calvarium is intact. Changes of prior ischemia are noted in the left parietal lobe posteriorly. No acute areas of ischemia are noted. No hemorrhage or space-occupying mass lesion is seen. IMPRESSION: Chronic changes without acute abnormality. Electronically Signed   By: Alcide Clever M.D.   On: 01/24/2015 21:48   Mr Maxine Glenn Head Wo Contrast  01/25/2015  CLINICAL DATA:  Temporary LEFT eye blindness concerning for transient ischemic attack. LEFT-sided headaches. EXAM: MRI HEAD WITHOUT CONTRAST MRA HEAD WITHOUT CONTRAST TECHNIQUE: Multiplanar, multiecho pulse sequences of the brain and surrounding structures were obtained without intravenous contrast. Angiographic images of the head were obtained using MRA technique without contrast. COMPARISON:  CT head February 01, 2015 MRI of the brain December 14, 2006 FINDINGS: MRI HEAD  FINDINGS The ventricles and sulci are normal for patient's age. No abnormal parenchymal signal, mass lesions, mass effect. 11 mm focus of faint reduced diffusion in RIGHT periatrial white matter, occipital lobe with low ADC value. Ventricles and sulci are normal for patient's age. Patchy supratentorial white matter T2 hyperintensities. No midline shift, mass effect or mass lesions. LEFT periatrial white matter T2 hyperintensity with low T1 signal. No susceptibility artifact to suggest hemorrhage. No abnormal extra-axial fluid collections. No extra-axial masses though, contrast enhanced sequences would be more sensitive. Normal major intracranial vascular flow voids seen at the skull base. Ocular globes and orbital contents are unremarkable though not tailored for evaluation. No abnormal sellar expansion. No suspicious calvarial bone marrow signal. Craniocervical junction maintained. RIGHT  sphenoid mucosal retention cyst without paranasal sinus air-fluid levels. MRA HEAD FINDINGS Anterior circulation: Normal flow related enhancement of the included cervical, petrous, cavernous and supraclinoid internal carotid arteries. Patent anterior communicating artery. Normal flow related enhancement of the anterior and middle cerebral arteries, including distal segments. Mild luminal irregularity of the middle cerebral arteries. No large vessel occlusion, high-grade stenosis, aneurysm. Posterior circulation: LEFT vertebral artery is dominant. Basilar artery is patent, with normal flow related enhancement of the main branch vessels. Robust LEFT, small RIGHT posterior communicating artery present. Loss of flow related enhancement in distal RIGHT posterior cerebral artery. No large vessel occlusion, high-grade stenosis, abnormal luminal irregularity, aneurysm. IMPRESSION: MRI HEAD: 11 mm focus of acute ischemia RIGHT occipital lobe. Old LEFT MCA territory infarcts and mild to moderate chronic small vessel ischemic disease. MRA  HEAD: Distal RIGHT posterior cerebral artery occlusion versus artifact. Mild luminal irregularity of the middle cerebral arteries compatible with atherosclerosis. Electronically Signed   By: Awilda Metro M.D.   On: 01/25/2015 03:17   Mr Brain Wo Contrast  01/25/2015  CLINICAL DATA:  Temporary LEFT eye blindness concerning for transient ischemic attack. LEFT-sided headaches. EXAM: MRI HEAD WITHOUT CONTRAST MRA HEAD WITHOUT CONTRAST TECHNIQUE: Multiplanar, multiecho pulse sequences of the brain and surrounding structures were obtained without intravenous contrast. Angiographic images of the head were obtained using MRA technique without contrast. COMPARISON:  CT head February 01, 2015 MRI of the brain December 14, 2006 FINDINGS: MRI HEAD FINDINGS The ventricles and sulci are normal for patient's age. No abnormal parenchymal signal, mass lesions, mass effect. 11 mm focus of faint reduced diffusion in RIGHT periatrial white matter, occipital lobe with low ADC value. Ventricles and sulci are normal for patient's age. Patchy supratentorial white matter T2 hyperintensities. No midline shift, mass effect or mass lesions. LEFT periatrial white matter T2 hyperintensity with low T1 signal. No susceptibility artifact to suggest hemorrhage. No abnormal extra-axial fluid collections. No extra-axial masses though, contrast enhanced sequences would be more sensitive. Normal major intracranial vascular flow voids seen at the skull base. Ocular globes and orbital contents are unremarkable though not tailored for evaluation. No abnormal sellar expansion. No suspicious calvarial bone marrow signal. Craniocervical junction maintained. RIGHT sphenoid mucosal retention cyst without paranasal sinus air-fluid levels. MRA HEAD FINDINGS Anterior circulation: Normal flow related enhancement of the included cervical, petrous, cavernous and supraclinoid internal carotid arteries. Patent anterior communicating artery. Normal flow related  enhancement of the anterior and middle cerebral arteries, including distal segments. Mild luminal irregularity of the middle cerebral arteries. No large vessel occlusion, high-grade stenosis, aneurysm. Posterior circulation: LEFT vertebral artery is dominant. Basilar artery is patent, with normal flow related enhancement of the main branch vessels. Robust LEFT, small RIGHT posterior communicating artery present. Loss of flow related enhancement in distal RIGHT posterior cerebral artery. No large vessel occlusion, high-grade stenosis, abnormal luminal irregularity, aneurysm. IMPRESSION: MRI HEAD: 11 mm focus of acute ischemia RIGHT occipital lobe. Old LEFT MCA territory infarcts and mild to moderate chronic small vessel ischemic disease. MRA HEAD: Distal RIGHT posterior cerebral artery occlusion versus artifact. Mild luminal irregularity of the middle cerebral arteries compatible with atherosclerosis. Electronically Signed   By: Awilda Metro M.D.   On: 01/25/2015 03:17   Dg Chest Portable 1 View  01/14/2015  CLINICAL DATA:  Chest pain. EXAM: PORTABLE CHEST 1 VIEW COMPARISON:  None. FINDINGS: Borderline cardiomegaly is noted. No pneumothorax or pleural effusion is noted. Right lung is clear. Minimal left basilar subsegmental atelectasis. The visualized skeletal structures  are unremarkable. IMPRESSION: Minimal left basilar subsegmental atelectasis. Borderline cardiomegaly. Electronically Signed   By: Lupita RaiderJames  Green Jr, M.D.   On: 01/14/2015 21:58     CBC  Recent Labs Lab 01/24/15 2116 01/24/15 2118  WBC  --  7.2  HGB 16.3 14.7  HCT 48.0 44.1  PLT  --  232  MCV  --  98.4  MCH  --  32.8  MCHC  --  33.3  RDW  --  13.2  LYMPHSABS  --  2.1  MONOABS  --  0.8  EOSABS  --  0.4  BASOSABS  --  0.0    Chemistries   Recent Labs Lab 01/24/15 2116 01/24/15 2118 01/25/15 0626  NA 135 135 139  K 5.7* 5.5* 4.2  CL 96* 99* 106  CO2  --  28 27  GLUCOSE 483* 551* 207*  BUN 24* 20 23*  CREATININE  1.80* 1.86* 1.41*  CALCIUM  --  9.4 8.6*  AST  --  30  --   ALT  --  38  --   ALKPHOS  --  60  --   BILITOT  --  0.8  --    ------------------------------------------------------------------------------------------------------------------ estimated creatinine clearance is 53.6 mL/min (by C-G formula based on Cr of 1.41). ------------------------------------------------------------------------------------------------------------------ No results for input(s): HGBA1C in the last 72 hours. ------------------------------------------------------------------------------------------------------------------  Recent Labs  01/25/15 0626  CHOL 170  HDL 55  LDLCALC 102*  TRIG 63  CHOLHDL 3.1   ------------------------------------------------------------------------------------------------------------------ No results for input(s): TSH, T4TOTAL, T3FREE, THYROIDAB in the last 72 hours.  Invalid input(s): FREET3 ------------------------------------------------------------------------------------------------------------------ No results for input(s): VITAMINB12, FOLATE, FERRITIN, TIBC, IRON, RETICCTPCT in the last 72 hours.  Coagulation profile  Recent Labs Lab 01/24/15 2118  INR 0.94    No results for input(s): DDIMER in the last 72 hours.  Cardiac Enzymes No results for input(s): CKMB, TROPONINI, MYOGLOBIN in the last 168 hours.  Invalid input(s): CK ------------------------------------------------------------------------------------------------------------------ Invalid input(s): POCBNP     Time Spent in minutes   25 minutes   Dorothea Yow M.D on 01/25/2015 at 1:03 PM  Between 7am to 7pm - Pager - [phone]  After 7pm go to [email or web address] - password [email or web address]  [facility]   Office  [phone]

## 2015-01-25 NOTE — Progress Notes (Unsigned)
CC: Loss of va os   HPI: Asked to eval pt with loss of va os today. "blurred vision left eye" that gradually came back. Denies eye pain, ha, f/f/f. No previous occurrence.          Past Medical History  Diagnosis Date  . CORONARY ARTERY DISEASE 08/11/2006  . CVA 12/19/2006    2008  . DIABETES MELLITUS, TYPE I 08/11/2006  . ECZEMA, HANDS 12/15/2009  . HYPERLIPIDEMIA 08/11/2006  . HYPERTENSION 08/11/2006  . MYOCARDIAL INFARCTION, HX OF 08/11/2006    07/14/01     Past Surgical History  Procedure Laterality Date  . Tonsilectomy, adenoidectomy, bilateral myringotomy and tubes    . Coronary stent placement    . Cardiac catheterization N/A 01/16/2015    Procedure: Left Heart Cath and Coronary Angiography; Surgeon: Runell GessJonathan J Berry, MD; [facility]; Service: Cardiovascular; Laterality: N/A;      Social History:  reports that he quit smoking about 14 years ago. His smoking use included Cigarettes. He has a 30 pack-year smoking history. He does not have any smokeless tobacco history on file. He reports that he drinks alcohol. He reports that he does not use illicit drugs. Where does patient live--home and with whom if at home? Can patient participate in ADLs? Yes  No Known Allergies  Family History  Problem Relation Age of Onset  . Diabetes Neg Hx   . CAD Father     age 63  . CAD Paternal Grandfather     1870s  . CAD Paternal Grandmother     2170s  . Brain cancer Maternal Grandmother         Prior to Admission medications   Medication Sig Start Date End Date Taking? Authorizing Provider  acetaminophen (TYLENOL) 325 MG tablet Take 2 tablets (650 mg total) by mouth every 4 (four) hours as needed for headache or mild pain. 01/17/15  Yes Abelino DerrickLuke K Kilroy, PA-C  aspirin 81 MG chewable tablet Chew 1 tablet (81 mg total) by mouth daily. 01/17/15  Yes Luke K  Kilroy, PA-C  atorvastatin (LIPITOR) 40 MG tablet Take 1 tablet (40 mg total) by mouth daily. 06/24/14  Yes Shelva MajesticStephen O Hunter, MD  carvedilol (COREG) 6.25 MG tablet Take 1 tablet (6.25 mg total) by mouth 2 (two) times daily with a meal. 01/17/15  Yes Abelino DerrickLuke K Kilroy, PA-C  ibuprofen (ADVIL,MOTRIN) 200 MG tablet Take 400 mg by mouth every 6 (six) hours as needed for moderate pain.    Yes Historical Provider, MD  nitroGLYCERIN (NITROSTAT) 0.4 MG SL tablet Place 1 tablet (0.4 mg total) under the tongue every 5 (five) minutes as needed for chest pain. Chest pain 01/17/15  Yes Luke K Kilroy, PA-C  NOVOLOG MIX 70/30 FLEXPEN (70-30) 100 UNIT/ML FlexPen Inject 12-30 Units into the skin 2 (two) times daily with a meal. Inject 12 units in the morning and 25-30 units in the evening depending on blood glucose reading 12/19/14  Yes Historical Provider, MD  Probiotic Product (PROBIOTIC PO) Take 1 capsule by mouth every morning.   Yes Historical Provider, MD  ramipril (ALTACE) 5 MG capsule Take 1 capsule (5 mg total) by mouth 2 (two) times daily. 06/24/14  Yes Shelva MajesticStephen O Hunter, MD  spironolactone (ALDACTONE) 25 MG tablet Take 1 tablet (25 mg total) by mouth daily. 01/17/15  Yes Abelino DerrickLuke K Kilroy, PA-C  ticagrelor (BRILINTA) 90 MG TABS tablet Take 1 tablet (90 mg total) by mouth 2 (two) times daily. 01/17/15  Yes Eda PaschalLuke K  Kilroy, PA-C  glucose blood (ONETOUCH VERIO) test strip 1 each by Other route 4 (four) times daily. And lances 4/day E11.9 12/19/14   Romero Belling, MD  Insulin Pen Needle (BD ULTRA-FINE PEN NEEDLES) 29G X 12.7MM MISC use as directed twice a day 09/03/13   Romero Belling, MD     Physical Exam: Filed Vitals:   01/24/15 2315 01/24/15 2330 01/24/15 2345 01/25/15 0000  BP: 116/74 113/78 107/70 117/75  Pulse: 50 57 59 64  Temp:      TempSrc:      Resp:  SpO2: 98% 100% 98% 98%       EXAM:  VA:  20/20 od,os iop 14,16 eom full ou cvf full ou Pupil perrl no apd ou  L/l: wnl ou Conj: pinguecula od, w+q os K: clear ou Ac D+Q ou Iris: wnl ou Lens: +ns ou Vit: clear ou  dfe Optic nerve 0.4  S+p ou Mac flat ou Vessel: wnl ou Peripher: flat x 4  A/p: 1. Subjective visual loss os: returned vision. Exam wnl. CVA work up per neurology.  2. Dm: no retinopathy or csme. Min cv risk factors. 3. Cataracts ou: not visually sig.  Fu with his eye provider for routine fu

## 2015-01-25 NOTE — ED Notes (Signed)
Dr. Sumner, neurology, at the bedside.  

## 2015-01-25 NOTE — Progress Notes (Signed)
STROKE TEAM PROGRESS NOTE   HISTORY John Carrillo is an 64 y.o. male hx of CAD, prior CVA, DM, HLD, HTN presenting with sudden change in vision in his left eye. He notes sudden loss of vision in his left eye. Reports vision loss was the majority of the temporal visual field with a crescent shaped intact visual field in his nasal field of the left eye. Reports intact vision and visual fields in his right eye. Denies any associated weakness, sensory or speech deficits. Symptoms lasted around 1-2 hours and is now back to his baseline. Had recent heart cath and stent placement 1 week ago. Had 2D echo on 12/12 showing LV EF of 15% with diffuse hypokinesis. No eye pain, no visual auras during the episode. No jaw claudication, no temporal pain.   Also had transient left periorbital pressure headache during this episode. Not his typical migraine headache. Patient does have a history of migraine with visual aura though he has never had a visual deficit during a migraine.   CT head imaging reviewed, no acute process noted.   Patient was not administered TPA secondary to resolved symptoms. He was admitted for further evaluation and treatment.   SUBJECTIVE (INTERVAL HISTORY) His wufe is at the bedside.  Overall he feels his condition is stable. (they are neighbors of Dr. Pearlean Brownie)   OBJECTIVE Temp:  [97.3 F (36.3 C)-97.8 F (36.6 C)] 97.8 F (36.6 C) (12/21 0900) Pulse Rate:  [50-73] 54 (12/21 0900) Cardiac Rhythm:  [-] Heart block (12/21 0800) Resp:  [10-19] 16 (12/21 0900) BP: (97-137)/(59-100) 97/59 mmHg (12/21 0900) SpO2:  [96 %-100 %] 99 % (12/21 0900) Weight:  [78.472 kg (173 lb)] 78.472 kg (173 lb) (12/21 0303)  CBC:   Recent Labs Lab 01/24/15 2116 01/24/15 2118  WBC  --  7.2  NEUTROABS  --  3.8  HGB 16.3 14.7  HCT 48.0 44.1  MCV  --  98.4  PLT  --  232    Basic Metabolic Panel:   Recent Labs Lab 01/24/15 2118 01/25/15 0626  NA 135 139  K 5.5* 4.2  CL 99* 106  CO2 28 27   GLUCOSE 551* 207*  BUN 20 23*  CREATININE 1.86* 1.41*  CALCIUM 9.4 8.6*    Lipid Panel:     Component Value Date/Time   CHOL 170 01/25/2015 0626   TRIG 63 01/25/2015 0626   HDL 55 01/25/2015 0626   CHOLHDL 3.1 01/25/2015 0626   VLDL 13 01/25/2015 0626   LDLCALC 102* 01/25/2015 0626   HgbA1c:  Lab Results  Component Value Date   HGBA1C 9.3* 01/15/2015   Urine Drug Screen: No results found for: LABOPIA, COCAINSCRNUR, LABBENZ, AMPHETMU, THCU, LABBARB    IMAGING  Ct Head Wo Contrast 01/24/2015  Chronic changes without acute abnormality.   MRI HEAD:  01/25/2015  11 mm focus of acute ischemia RIGHT occipital lobe. Old LEFT MCA territory infarcts and mild to moderate chronic small vessel ischemic disease.   MRA HEAD:  01/25/2015  Distal RIGHT posterior cerebral artery occlusion versus artifact. Mild luminal irregularity of the middle cerebral arteries compatible with atherosclerosis.   2D Echocardiogram  - Left ventricle: Diffuse hypokinesis septal and apical akinesis. The cavity size was severely dilated. Wall thickness was normal.The estimated ejection fraction was 15%. - Mitral valve: There was mild regurgitation. - Left atrium: The atrium was mildly dilated. - Right atrium: The atrium was mildly dilated. - Atrial septum: No defect or patent foramen ovale was identified. -  Tricuspid valve: There was moderate regurgitation. - Pulmonary arteries: PA peak pressure: 59 mm Hg (S).   PHYSICAL EXAM Frail middle-aged Caucasian male not i.n distress. . Afebrile. Head is nontraumatic. Neck is supple without bruit.    Cardiac exam no murmur or gallop. Lungs are clear to auscultation. Distal pulses are well felt. Neurological Exam ;  Awake  Alert oriented x 3. Normal speech and language.eye movements full without nystagmus.fundi were not visualized. Vision acuity and fields appear normal. Hearing is normal. Palatal movements are normal. Face symmetric. Tongue midline. Normal  strength, tone, reflexes and coordination. Normal sensation. Gait deferred. ASSESSMENT/PLAN John Carrillo is a 64 y.o. male with history of CAD, prior CVA, DM, HLD, HTN presenting with loss of vision in his L eye. He did not receive IV t-PA due to resolved symptoms.   Stroke:  Non-dominant right occipital lobe infarct in setting of previous L MCA cortical infarcts, all felt to be  Embolic secondary to low EF source  Resultant  Neurologic deficits resolved  MRI  Acute right occipital lobe and old L MCA territory infarcts. small vessel disease   MRA  Distal R PCA occlusion  Carotid Doppler  pending   2D Echo  EF 15% TEE to look for embolic source. Arranged with [facility] for tomorrow.  If positive for PFO (patent foramen ovale), check bilateral lower extremity venous dopplers to rule out DVT as possible source of stroke. (I have made patient NPO after midnight tonight).  If TEE negative, a [facility] electrophysiologist will consult and consider placement of an implantable loop recorder to evaluate for atrial fibrillation as etiology of stroke. This has been explained to patient/family by Dr. Pearlean BrownieSethi and they are agreeable. - Dr. Johney FrameAllred saw in Sept. Known low EF. Defer decision about loop to EP  LDL 102  HgbA1c 9.3  Lovenox 40 mg sq daily for VTE prophylaxis Diet Carb Modified Fluid consistency:: Thin; Room service appropriate?: Yes  aspirin 81 mg daily and Brilinta prior to admission, now on aspirin 81 mg daily and Brilinta. Given low EF, discussed treatment options with pt and reasoning for various options. For now, will leave and discuss.  Ongoing aggressive stroke risk factor management  Therapy recommendations:  No therapy needs  Disposition:  Return home  Hypertension  Stable  Hyperlipidemia  Home meds:  lipitor 40, resumed in hospital  LDL 102, goal < 70  Continue statin at discharge  Diabetes type I  HgbA1c  9.3, goal < 7.0  Uncontrolled  Other Stroke Risk Factors  Advanced age  Former Cigarette smoker, quit smoking 14 years ago   ETOH use  hx drug use  Obesity, Body mass index is 25.54 kg/(m^2).   Hx stroke/TIA - 2008 without deficits  Coronary artery disease; SVD, chronic systolic HF  s/p stent last week MI  Seen by Dr. Johney FrameAllred 01/17/2015 for LifeVest consideration. Recommended repeat echo in 3 months. If EF remains depressed despite optimal medical therapy, would recommend CRTD at that time.   Atypical hx of occular migraine   Other Active Problems  CKD stage III  Hospital day # 0  Rhoderick MoodyBIBY,SHARON  [facility] See Amion for Pager information 01/25/2015 10:28 AM  I have personally examined this patient, reviewed notes, independently viewed imaging studies, participated in medical decision making and plan of care. I have made any additions or clarifications directly to the above note. Agree with note above. He presented with transient left  hand. Vision loss likely from a cardioembolic right parieto-occipital infarct. He remains at risk for neurological worsening, recurrent stroke, TIA needs ongoing stroke evaluation. Patient clearly needs to be on dual antiplatelet therapy given recent cardiac stent 10 days ago. We will obtain cardiology consult to find  the utility of doing TEE as well as loop recorder implant to determine if he has atrial fibrillation clot in his heart to justify adding anticoagulant. I had a long discussion with the patient and his wife and answered questions.  Delia Heady, MD Medical Director [facility] Pager: [phone] 01/25/2015 8:01 PM    To contact Stroke Continuity provider, please refer to [email or web address]. After hours, contact General Neurology

## 2015-01-25 NOTE — ED Notes (Signed)
Patient returned from MRI.

## 2015-01-25 NOTE — Progress Notes (Signed)
    [facility] has been requested to perform a transesophageal echocardiogram on [name] to r/o embolic source of stroke.  After careful review of history and examination, the risks and benefits of transesophageal echocardiogram have been explained including risks of esophageal damage, perforation (1:10,000 risk), bleeding, pharyngeal hematoma as well as other potential complications associated with conscious sedation including aspiration, arrhythmia, respiratory failure and death. Alternatives to treatment were discussed. The patient had many questions including initiation of anticoagulation without invasive procedure. All questions were answered. Patient is willing to proceed.  TEE - Dr. Thurmon FairMihai Croitoru, MD @ 9000  . NPO after midnight. Meds with sips.   Delshawn Stech, PA-C 01/25/2015 4:05 PM

## 2015-01-25 NOTE — ED Notes (Signed)
Admitting MD at the bedside, patient to travel to MRI, then to new room assignment.

## 2015-01-25 NOTE — ED Notes (Signed)
CBG 178 

## 2015-01-25 NOTE — Consult Note (Signed)
CARDIOLOGY CONSULT NOTE     Patient ID: John Carrillo MRN: [id number] DOB/AGE: [birth year] 64 y.o.  Admit date: 01/24/2015 Referring Physician Delia Heady MD Primary Physician Tana Conch, MD Primary Cardiologist Charlton Haws MD Reason for Consultation CVA  HPI: John Carrillo is a very pleasant 64 yo WM seen at the request of Dr. Randol Kern for evaluation of CVA. He has a history of CAD, prior CVA, HTN, HL and ischemic cardiomyopathy. EF 15%. Presented on 12/10 with SVT and chest pain. He underwent cardiac cath and had stenting of the mid to distal RCA with a DES. He is now on ASA and Brilinta. He was readmitted on 12/20 with sudden vision loss in his left eye.  CT showed no acute hemorrhage. MRI showed a new right occipital lobe CVA. There are old left MCA territory CVAs. Patient has no known history of atrial fibrillation. Was seen by Dr. [name] for EP evaluation for possible ICD on 12/13. Plan was to wait and recheck LV function 3 months after revascularization. He denies any current chest pain, SOB, or palpitations.   Past Medical History  Diagnosis Date  . CORONARY ARTERY DISEASE 08/11/2006  . CVA 12/19/2006    2008  . DIABETES MELLITUS, TYPE I 08/11/2006  . ECZEMA, HANDS 12/15/2009  . HYPERLIPIDEMIA 08/11/2006  . HYPERTENSION 08/11/2006  . MYOCARDIAL INFARCTION, HX OF 08/11/2006    07/14/01    Family History  Problem Relation Age of Onset  . Diabetes Neg Hx   . CAD Father     age 31  . CAD Paternal Grandfather     80s  . CAD Paternal Grandmother     38s  . Brain cancer Maternal Grandmother     Social History   Social History  . Marital Status: Married    Spouse Name: N/A  . Number of Children: N/A  . Years of Education: N/A   Occupational History  . Writer   Social History Main Topics  . Smoking status: Former Smoker -- 1.00 packs/day for 30 years    Types: Cigarettes    Quit date: 05/03/2000  . Smokeless tobacco: Not on file  . Alcohol  Use: 0.0 oz/week    0 Standard drinks or equivalent per week     Comment: 1-2/month  . Drug Use: No  . Sexual Activity: Not on file   Other Topics Concern  . Not on file   Social History Narrative   Married (kids are patients here, wife seen elsewhere). 3 step children, 1 biological. No grandkids.    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 Tonsilectomy, adenoidectomy, bilateral myringotomy and tubes    . Coronary stent placement    . Cardiac catheterization N/A 01/16/2015    Procedure: Left Heart Cath and Coronary Angiography;  Surgeon: [name], MD;  Location: [facility] INVASIVE CV LAB;  Service: Cardiovascular;  Laterality: N/A;     Prescriptions prior to admission  Medication Sig Dispense Refill Last Dose  . acetaminophen (TYLENOL) 325 MG tablet Take 2 tablets (650 mg total) by mouth every 4 (four) hours as needed for headache or mild pain.   01/24/2015 at Unknown time  .  aspirin 81 MG chewable tablet Chew 1 tablet (81 mg total) by mouth daily.   01/24/2015 at Unknown time  . atorvastatin (LIPITOR) 40 MG tablet Take 1 tablet (40 mg total) by mouth daily. 30 tablet 11 01/24/2015 at Unknown time  . carvedilol (COREG) 6.25 MG tablet Take 1 tablet (6.25 mg total) by mouth 2 (two) times daily with a meal. 60 tablet 11 01/24/2015 at 1800  . ibuprofen (ADVIL,MOTRIN) 200 MG tablet Take 400 mg by mouth every 6 (six) hours as needed for moderate pain.    01/24/2015 at Unknown time  . nitroGLYCERIN (NITROSTAT) 0.4 MG SL tablet Place 1 tablet (0.4 mg total) under the tongue every 5 (five) minutes as needed for chest pain. Chest pain 30 tablet 5 unknown  . NOVOLOG MIX 70/30 FLEXPEN (70-30) 100 UNIT/ML FlexPen Inject 12-30  Units into the skin 2 (two) times daily with a meal. Inject 12 units in the morning and 25-30 units in the evening depending on blood glucose reading  1 01/24/2015 at Unknown time  . Probiotic Product (PROBIOTIC PO) Take 1 capsule by mouth every morning.   01/24/2015 at Unknown time  . ramipril (ALTACE) 5 MG capsule Take 1 capsule (5 mg total) by mouth 2 (two) times daily. 60 capsule 11 01/24/2015 at Unknown time  . spironolactone (ALDACTONE) 25 MG tablet Take 1 tablet (25 mg total) by mouth daily. 30 tablet 11 01/24/2015 at Unknown time  . ticagrelor (BRILINTA) 90 MG TABS tablet Take 1 tablet (90 mg total) by mouth 2 (two) times daily. 60 tablet 11 01/24/2015 at Unknown time  . glucose blood (ONETOUCH VERIO) test strip 1 each by Other route 4 (four) times daily. And lances 4/day E11.9 120 each 3 Taking  . Insulin Pen Needle (BD ULTRA-FINE PEN NEEDLES) 29G X 12.7MM MISC use as directed twice a day 200 each 2 Taking    @  ROS: As noted in HPI. All other systems are reviewed and are negative unless otherwise mentioned.   Physical Exam: Blood pressure 97/59, pulse 54, temperature 97.8 F (36.6 C), temperature source Oral, resp. rate 16, weight 78.472 kg (173 lb), SpO2 99 %. Current Weight  01/25/15 78.472 kg (173 lb)  01/17/15 82.3 kg (181 lb 7 oz)  12/23/14 80.74 kg (178 lb)    GENERAL:  Well appearing WM in NAD HEENT:  PERRL, EOMI, sclera are clear. Oropharynx is clear. NECK:  No jugular venous distention, carotid upstroke brisk and symmetric, no bruits, no thyromegaly or adenopathy LUNGS:  Clear to auscultation bilaterally CHEST:  Unremarkable HEART:  RRR,  PMI not displaced or sustained,S1 and S2 within normal limits, no S3, no S4: no clicks, no rubs, no murmurs ABD:  Soft, nontender. BS +, no masses or bruits. No hepatomegaly, no splenomegaly EXT:  2 + pulses throughout, no edema, no cyanosis no clubbing SKIN:  Warm and dry.  No rashes NEURO:  Alert and oriented x 3. Cranial  nerves II through XII intact. PSYCH:  Cognitively intact    Labs:   Lab Results  Component Value Date   WBC 7.2 01/24/2015   HGB 14.7 01/24/2015   HCT 44.1 01/24/2015   MCV 98.4 01/24/2015   PLT 232 01/24/2015    Recent Labs Lab 01/24/15 2118 01/25/15 0626  NA 135 139  K 5.5* 4.2  CL 99* 106  CO2 28 27  BUN 20 23*  CREATININE 1.86* 1.41*  CALCIUM 9.4 8.6*  PROT 6.1*  --   BILITOT 0.8  --  ALKPHOS 60  --   ALT 38  --   AST 30  --   GLUCOSE 551* 207*   Lab Results  Component Value Date   CKTOTAL 192 12/14/2006   CKTOTAL 190 12/14/2006   CKTOTAL 233* 12/14/2006   CKMB 2.0 12/14/2006   CKMB 2.1 12/14/2006   CKMB 2.2 12/14/2006   TROPONINI 0.13* 01/15/2015   TROPONINI 0.19* 01/15/2015   TROPONINI 0.20* 01/15/2015   Lab Results  Component Value Date   CHOL 170 01/25/2015   CHOL 252* 06/24/2014   CHOL 172 11/26/2013   Lab Results  Component Value Date   HDL 55 01/25/2015   HDL 61.70 06/24/2014   HDL 58 11/26/2013   Lab Results  Component Value Date   LDLCALC 102* 01/25/2015   LDLCALC 172* 06/24/2014   LDLCALC 102* 11/26/2013   Lab Results  Component Value Date   TRIG 63 01/25/2015   TRIG 94.0 06/24/2014   TRIG 60 11/26/2013   Lab Results  Component Value Date   CHOLHDL 3.1 01/25/2015   CHOLHDL 4 06/24/2014   CHOLHDL 3.0 11/26/2013   Lab Results  Component Value Date   LDLDIRECT 100.0 12/23/2014   LDLDIRECT 133.0 12/01/2009    No results found for: PROBNP Lab Results  Component Value Date   TSH 7.31* 12/23/2014   Lab Results  Component Value Date   HGBA1C 9.3* 01/15/2015    Radiology: Ct Head Wo Contrast  01/24/2015  CLINICAL DATA:  Left visual loss with left-sided headaches EXAM: CT HEAD WITHOUT CONTRAST TECHNIQUE: Contiguous axial images were obtained from the base of the skull through the vertex without intravenous contrast. COMPARISON:  12/13/2006 FINDINGS: Bony calvarium is intact. Changes of prior ischemia are noted in the  left parietal lobe posteriorly. No acute areas of ischemia are noted. No hemorrhage or space-occupying mass lesion is seen. IMPRESSION: Chronic changes without acute abnormality. Electronically Signed   By: Alcide CleverMark  Lukens M.D.   On: 01/24/2015 21:48   Mr Maxine GlennMra Head Wo Contrast  01/25/2015  CLINICAL DATA:  Temporary LEFT eye blindness concerning for transient ischemic attack. LEFT-sided headaches. EXAM: MRI HEAD WITHOUT CONTRAST MRA HEAD WITHOUT CONTRAST TECHNIQUE: Multiplanar, multiecho pulse sequences of the brain and surrounding structures were obtained without intravenous contrast. Angiographic images of the head were obtained using MRA technique without contrast. COMPARISON:  CT head February 01, 2015 MRI of the brain December 14, 2006 FINDINGS: MRI HEAD FINDINGS The ventricles and sulci are normal for patient's age. No abnormal parenchymal signal, mass lesions, mass effect. 11 mm focus of faint reduced diffusion in RIGHT periatrial white matter, occipital lobe with low ADC value. Ventricles and sulci are normal for patient's age. Patchy supratentorial white matter T2 hyperintensities. No midline shift, mass effect or mass lesions. LEFT periatrial white matter T2 hyperintensity with low T1 signal. No susceptibility artifact to suggest hemorrhage. No abnormal extra-axial fluid collections. No extra-axial masses though, contrast enhanced sequences would be more sensitive. Normal major intracranial vascular flow voids seen at the skull base. Ocular globes and orbital contents are unremarkable though not tailored for evaluation. No abnormal sellar expansion. No suspicious calvarial bone marrow signal. Craniocervical junction maintained. RIGHT sphenoid mucosal retention cyst without paranasal sinus air-fluid levels. MRA HEAD FINDINGS Anterior circulation: Normal flow related enhancement of the included cervical, petrous, cavernous and supraclinoid internal carotid arteries. Patent anterior communicating artery. Normal  flow related enhancement of the anterior and middle cerebral arteries, including distal segments. Mild luminal irregularity of the middle cerebral arteries. No  large vessel occlusion, high-grade stenosis, aneurysm. Posterior circulation: LEFT vertebral artery is dominant. Basilar artery is patent, with normal flow related enhancement of the main branch vessels. Robust LEFT, small RIGHT posterior communicating artery present. Loss of flow related enhancement in distal RIGHT posterior cerebral artery. No large vessel occlusion, high-grade stenosis, abnormal luminal irregularity, aneurysm. IMPRESSION: MRI HEAD: 11 mm focus of acute ischemia RIGHT occipital lobe. Old LEFT MCA territory infarcts and mild to moderate chronic small vessel ischemic disease. MRA HEAD: Distal RIGHT posterior cerebral artery occlusion versus artifact. Mild luminal irregularity of the middle cerebral arteries compatible with atherosclerosis. Electronically Signed   By: Awilda Metro M.D.   On: 01/25/2015 03:17   Mr Brain Wo Contrast  01/25/2015  CLINICAL DATA:  Temporary LEFT eye blindness concerning for transient ischemic attack. LEFT-sided headaches. EXAM: MRI HEAD WITHOUT CONTRAST MRA HEAD WITHOUT CONTRAST TECHNIQUE: Multiplanar, multiecho pulse sequences of the brain and surrounding structures were obtained without intravenous contrast. Angiographic images of the head were obtained using MRA technique without contrast. COMPARISON:  CT head February 01, 2015 MRI of the brain December 14, 2006 FINDINGS: MRI HEAD FINDINGS The ventricles and sulci are normal for patient's age. No abnormal parenchymal signal, mass lesions, mass effect. 11 mm focus of faint reduced diffusion in RIGHT periatrial white matter, occipital lobe with low ADC value. Ventricles and sulci are normal for patient's age. Patchy supratentorial white matter T2 hyperintensities. No midline shift, mass effect or mass lesions. LEFT periatrial white matter T2 hyperintensity  with low T1 signal. No susceptibility artifact to suggest hemorrhage. No abnormal extra-axial fluid collections. No extra-axial masses though, contrast enhanced sequences would be more sensitive. Normal major intracranial vascular flow voids seen at the skull base. Ocular globes and orbital contents are unremarkable though not tailored for evaluation. No abnormal sellar expansion. No suspicious calvarial bone marrow signal. Craniocervical junction maintained. RIGHT sphenoid mucosal retention cyst without paranasal sinus air-fluid levels. MRA HEAD FINDINGS Anterior circulation: Normal flow related enhancement of the included cervical, petrous, cavernous and supraclinoid internal carotid arteries. Patent anterior communicating artery. Normal flow related enhancement of the anterior and middle cerebral arteries, including distal segments. Mild luminal irregularity of the middle cerebral arteries. No large vessel occlusion, high-grade stenosis, aneurysm. Posterior circulation: LEFT vertebral artery is dominant. Basilar artery is patent, with normal flow related enhancement of the main branch vessels. Robust LEFT, small RIGHT posterior communicating artery present. Loss of flow related enhancement in distal RIGHT posterior cerebral artery. No large vessel occlusion, high-grade stenosis, abnormal luminal irregularity, aneurysm. IMPRESSION: MRI HEAD: 11 mm focus of acute ischemia RIGHT occipital lobe. Old LEFT MCA territory infarcts and mild to moderate chronic small vessel ischemic disease. MRA HEAD: Distal RIGHT posterior cerebral artery occlusion versus artifact. Mild luminal irregularity of the middle cerebral arteries compatible with atherosclerosis. Electronically Signed   By: Awilda Metro M.D.   On: 01/25/2015 03:17    EKG: NSR with LAE and LBBB. I have personally reviewed and interpreted this study.   ASSESSMENT AND PLAN:  1. Recurrent CVA- possibly embolic. Potential sources include LV thrombus with  low EF or LA thrombus with intermittent Afib. No LV thrombus by Echo 2 weeks ago. No documented Afib. Normally would consider switching from antiplatelet to anticoagulant therapy but patient requires antiplatelet therapy with recent DES. Before commiting him to triple therapy with significant increased risk of bleeding would like to document cardiac thrombus or Afib. Discussed with Dr. Pearlean Brownie. Will proceed with TEE tomorrow. If thrombus is noted will  start anticoagulation. If not then will proceed with ILR to try and document Afib. If anticoagulation indicated would continue Brilinta and add Eliquis. Would stop ASA at one month post PCI. TEE procedure reviewed with patient in detail and will schedule for am. 2. Ischemic CM with chronic systolic CHF. Well compensated. On aldactone, Coreg, and Altace 3. CAD s/p recent DES RCA. Prior stenting of LAD 2001 4. HTN 5. Hyperlipidemia on lipitor. 6. DM on insulin.   Signed: Peter [place], MDFACC  01/25/2015, 5:10 PM

## 2015-01-25 NOTE — ED Notes (Signed)
Attempted to call report

## 2015-01-25 NOTE — H&P (Addendum)
[facility] History and Physical  John Carrillo WUJ:[id number] DOB: 09-01-1950 DOA: 01/24/2015  Referring physician: ED PCP: Tana Conch, MD   Chief Complaint: left eye blindness  HPI:  Patient is a 64 y/o male with a past medical history of CAD s/p stent (last week), cva w/o residual deficit 8 years ago, and CKD stage III; who present with acute onset of left eye vision loss. Symptoms started prior to dinner at 9-9:30 pm. Lost vision out of left eye field except for a crescent slither of the medial aspect of his visual field. He notified his wife and she brought him to the hospital. During ride to the hospital patient notes that slowly regained vision out of the left eye laterally with complete vision restored 1-2 hours after onset. Associated symptoms include a right sided nasal pressure. Denies any eye discharge, redness, or tearing during the event in question. Reports last eye exam 2 years ago and was told no signs of glaucoma or diabetic retinopathy. Patient recently had a cardiac cath with stent placement one week ago. His last echo showed EF of 15% with diffuse hypokinesis. Patient reports that his hemoglobin A1c was elevated at 9 last week when checked. He also reports associated symptoms of decreased urine output and increased thirst the last few days.   Upon arrival to the emergency department initial CT scan showed no acute infarction. Neurology and ophthalmology consulted in the ED. Initial lab work showed elevated blood glucose 551, anion gap 8, potassium 5.5, Cr 1.86,  BUN 20    Review of Systems  Constitutional: Negative for weight loss and malaise/fatigue.  HENT: Negative for ear pain and tinnitus.   Eyes: Positive for blurred vision and pain. Negative for redness.  Respiratory: Negative for hemoptysis and sputum production.   Cardiovascular: Negative for chest pain, palpitations and leg swelling.  Gastrointestinal: Negative for vomiting and abdominal pain.   Genitourinary: Negative for urgency and hematuria.       Decreased urine output  Musculoskeletal: Positive for joint pain. Negative for falls.  Skin: Negative for itching and rash.  Neurological: Negative for sensory change, speech change and focal weakness.  Endo/Heme/Allergies: Positive for polydipsia. Negative for environmental allergies.  Psychiatric/Behavioral: Negative for hallucinations and substance abuse.    Past Medical History  Diagnosis Date  . CORONARY ARTERY DISEASE 08/11/2006  . CVA 12/19/2006    2008  . DIABETES MELLITUS, TYPE I 08/11/2006  . ECZEMA, HANDS 12/15/2009  . HYPERLIPIDEMIA 08/11/2006  . HYPERTENSION 08/11/2006  . MYOCARDIAL INFARCTION, HX OF 08/11/2006    07/14/01     Past Surgical History  Procedure Laterality Date  . Tonsilectomy, adenoidectomy, bilateral myringotomy and tubes    . Coronary stent placement    . Cardiac catheterization N/A 01/16/2015    Procedure: Left Heart Cath and Coronary Angiography;  Surgeon: [name], MD;  Location: [facility] INVASIVE CV LAB;  Service: Cardiovascular;  Laterality: N/A;      Social History:  reports that he quit smoking about 14 years ago. His smoking use included Cigarettes. He has a 30 pack-year smoking history. He does not have any smokeless tobacco history on file. He reports that he drinks alcohol. He reports that he does not use illicit drugs. Where does patient live--home   and with whom if at home? Can patient participate in ADLs? Yes  No Known Allergies  Family History  Problem Relation Age of Onset  . Diabetes Neg Hx   . CAD Father  age 65  . CAD Paternal Grandfather     67s  . CAD Paternal Grandmother     3s  . Brain cancer Maternal Grandmother         Prior to Admission medications   Medication Sig Start Date End Date Taking? Authorizing Provider  acetaminophen (TYLENOL) 325 MG tablet Take 2 tablets (650 mg total) by mouth every 4 (four) hours as needed for headache or mild pain.  01/17/15  Yes Abelino Derrick, PA-C  aspirin 81 MG chewable tablet Chew 1 tablet (81 mg total) by mouth daily. 01/17/15  Yes Luke K Kilroy, PA-C  atorvastatin (LIPITOR) 40 MG tablet Take 1 tablet (40 mg total) by mouth daily. 06/24/14  Yes [name], MD  carvedilol (COREG) 6.25 MG tablet Take 1 tablet (6.25 mg total) by mouth 2 (two) times daily with a meal. 01/17/15  Yes Abelino Derrick, PA-C  ibuprofen (ADVIL,MOTRIN) 200 MG tablet Take 400 mg by mouth every 6 (six) hours as needed for moderate pain.    Yes Historical Provider, MD  nitroGLYCERIN (NITROSTAT) 0.4 MG SL tablet Place 1 tablet (0.4 mg total) under the tongue every 5 (five) minutes as needed for chest pain. Chest pain 01/17/15  Yes Luke K Kilroy, PA-C  NOVOLOG MIX 70/30 FLEXPEN (70-30) 100 UNIT/ML FlexPen Inject 12-30 Units into the skin 2 (two) times daily with a meal. Inject 12 units in the morning and 25-30 units in the evening depending on blood glucose reading 12/19/14  Yes Historical Provider, MD  Probiotic Product (PROBIOTIC PO) Take 1 capsule by mouth every morning.   Yes Historical Provider, MD  ramipril (ALTACE) 5 MG capsule Take 1 capsule (5 mg total) by mouth 2 (two) times daily. 06/24/14  Yes [name], MD  spironolactone (ALDACTONE) 25 MG tablet Take 1 tablet (25 mg total) by mouth daily. 01/17/15  Yes Abelino Derrick, PA-C  ticagrelor (BRILINTA) 90 MG TABS tablet Take 1 tablet (90 mg total) by mouth 2 (two) times daily. 01/17/15  Yes Luke K Kilroy, PA-C  glucose blood (ONETOUCH VERIO) test strip 1 each by Other route 4 (four) times daily. And lances 4/day E11.9 12/19/14   Romero Belling, MD  Insulin Pen Needle (BD ULTRA-FINE PEN NEEDLES) 29G X 12.7MM MISC use as directed twice a day 09/03/13   Romero Belling, MD     Physical Exam: Filed Vitals:   01/24/15 2315 01/24/15 2330 01/24/15 2345 01/25/15 0000  BP: 116/74 113/78 107/70 117/75  Pulse: 50 57 59 64  Temp:      TempSrc:      Resp:  SpO2: 98% 100% 98%  98%     Constitutional: Vital signs reviewed. Patient is a well-developed and well-nourished in no acute distress and cooperative with exam. Alert and oriented x3.  Head: Normocephalic and atraumatic  Ear: TM normal bilaterally  Mouth: no erythema or exudates, MMM  Eyes: PERRL, EOMI, conjunctivae normal, No scleral icterus.  Neck: Supple, Trachea midline normal ROM, No JVD, mass, thyromegaly, or carotid bruit present.  Cardiovascular: RRR, S1 normal, S2 normal, no MRG, pulses symmetric and intact bilaterally  Pulmonary/Chest: CTAB, no wheezes, rales, or rhonchi  Abdominal: Soft. Non-tender, non-distended, bowel sounds are normal, no masses, organomegaly, or guarding present.  GU: no CVA tenderness Musculoskeletal: No joint deformities, erythema, or stiffness, ROM full and no nontender Ext: no edema and no cyanosis, pulses palpable bilaterally (DP and PT)  Hematology: no cervical, inginal, or axillary adenopathy.  Neurological: A&O x3, Strenght is normal and symmetric bilaterally, cranial nerve II-XII are grossly intact, no focal motor deficit, sensory intact to light touch bilaterally.  Skin: Warm, dry and intact. No rash, cyanosis, or clubbing.  Psychiatric: Normal mood and affect. speech and behavior is normal. Judgment and thought content normal. Cognition and memory are normal.      Data Review   Micro Results No results found for this or any previous visit (from the past 240 hour(s)).  Radiology Reports Dg Chest 2 View  01/17/2015  CLINICAL DATA:  CHF, recent coronary stent, acute shortness of breath EXAM: CHEST  2 VIEW COMPARISON:  01/14/2015 FINDINGS: mild cardiac enlargement without CHF. Small pleural effusions noted with slight increased bibasilar atelectasis compared 01/14/2015. No pneumothorax. Trachea midline. Atherosclerosis of the aorta. No acute osseous finding. IMPRESSION: Small bilateral pleural effusions with increased bibasilar atelectasis. Borderline cardiomegaly  without CHF. Electronically Signed   By: Judie Petit.  Shick M.D.   On: 01/17/2015 11:01   Ct Head Wo Contrast  01/24/2015  CLINICAL DATA:  Left visual loss with left-sided headaches EXAM: CT HEAD WITHOUT CONTRAST TECHNIQUE: Contiguous axial images were obtained from the base of the skull through the vertex without intravenous contrast. COMPARISON:  12/13/2006 FINDINGS: Bony calvarium is intact. Changes of prior ischemia are noted in the left parietal lobe posteriorly. No acute areas of ischemia are noted. No hemorrhage or space-occupying mass lesion is seen. IMPRESSION: Chronic changes without acute abnormality. Electronically Signed   By: Alcide Clever M.D.   On: 01/24/2015 21:48   Dg Chest Portable 1 View  01/14/2015  CLINICAL DATA:  Chest pain. EXAM: PORTABLE CHEST 1 VIEW COMPARISON:  None. FINDINGS: Borderline cardiomegaly is noted. No pneumothorax or pleural effusion is noted. Right lung is clear. Minimal left basilar subsegmental atelectasis. The visualized skeletal structures are unremarkable. IMPRESSION: Minimal left basilar subsegmental atelectasis. Borderline cardiomegaly. Electronically Signed   By: Lupita Raider, M.D.   On: 01/14/2015 21:58     CBC  Recent Labs Lab 01/24/15 2116 01/24/15 2118  WBC  --  7.2  HGB 16.3 14.7  HCT 48.0 44.1  PLT  --  232  MCV  --  98.4  MCH  --  32.8  MCHC  --  33.3  RDW  --  13.2  LYMPHSABS  --  2.1  MONOABS  --  0.8  EOSABS  --  0.4  BASOSABS  --  0.0    Chemistries   Recent Labs Lab 01/24/15 2116 01/24/15 2118  NA 135 135  K 5.7* 5.5*  CL 96* 99*  CO2  --  28  GLUCOSE 483* 551*  BUN 24* 20  CREATININE 1.80* 1.86*  CALCIUM  --  9.4  AST  --  30  ALT  --  38  ALKPHOS  --  60  BILITOT  --  0.8   ------------------------------------------------------------------------------------------------------------------ estimated creatinine clearance is 40.7 mL/min (by C-G formula based on Cr of  1.86). ------------------------------------------------------------------------------------------------------------------ No results for input(s): HGBA1C in the last 72 hours. ------------------------------------------------------------------------------------------------------------------ No results for input(s): CHOL, HDL, LDLCALC, TRIG, CHOLHDL, LDLDIRECT in the last 72 hours. ------------------------------------------------------------------------------------------------------------------ No results for input(s): TSH, T4TOTAL, T3FREE, THYROIDAB in the last 72 hours.  Invalid input(s): FREET3 ------------------------------------------------------------------------------------------------------------------ No results for input(s): VITAMINB12, FOLATE, FERRITIN, TIBC, IRON, RETICCTPCT in the last 72 hours.  Coagulation profile  Recent Labs Lab 01/24/15 2118  INR 0.94    No results for input(s): DDIMER in the last 72 hours.  Cardiac Enzymes No results  for input(s): CKMB, TROPONINI, MYOGLOBIN in the last 168 hours.  Invalid input(s): CK ------------------------------------------------------------------------------------------------------------------ Invalid input(s): POCBNP   CBG: No results for input(s): GLUCAP in the last 168 hours.     EKG: Independently reviewed. Sinus rhythm   Assessment/Plan Principal Problem:  Acute left eye visual field deficit with question of possible TIA/stroke: Now resolved. Patient reports period with left eye visual field deficit which slowly resolved since being admitted to the ED. Initial CT imaging negative for any signs of stroke. Neurology consulted in ED and recommend complete TIA/stroke workup - Ophthalmology/neurology consulted f/u recommendations - MRI/MRA pending - stroke protocol for monitoring - telemetry  Hyperkalemia: History reports recently being started on Brilinta and spironolactone. Initial potassium 5.5 on admission. No  significant t wave peaking. Given IVF and glucose in ed. - continue  NS IVF - Held spironolactone secondary to hyperkalemia    Diabetes mellitus with hyperglycemia: Initial blood glucose 551, with no anion gap. Patient received 1 L of IV fluids and 10 units of NovoLog insulin. Prior to admission reports taking his 7030 NovoLog dose. Patient reports last hemoglobin A1c taken last week was elevated at 9. - Urinalysis pending - Continue patient's 70/30 NovoLog dose in am - Blood glucose checks every 4 hours    Acute kidney injury superimposed on chronic kidney disease stage III:  Baseline creatinine around 1.2-1.3 patient acutely found to have a creatinine of 1.86 on admission. Suspect prerenal in nature as patient found to have hyperglycemia. - IV fluids of normal saline   Previous history of CVA without residual deficits  CADs/p stent:  Patient with recent stent placement last week started on Brilinta and spironolactone EF noted to be 15%. - continue Brilinta  Code Status:   full Family Communication: bedside Disposition Plan: admit   Total time spent 55 minutes.Greater than 50% of this time was spent in counseling, explanation of diagnosis, planning of further management, and coordination of care  Clydie BraunRondell A Smith [facility] Pager [phone]  If 7PM-7AM, please contact night-coverage [email or web address] Password [email or web address] 01/25/2015, 12:21 AM

## 2015-01-26 ENCOUNTER — Encounter (HOSPITAL_COMMUNITY): Payer: Self-pay | Admitting: *Deleted

## 2015-01-26 ENCOUNTER — Inpatient Hospital Stay (HOSPITAL_COMMUNITY): Payer: 59

## 2015-01-26 ENCOUNTER — Encounter (HOSPITAL_COMMUNITY)
Admission: EM | Disposition: A | Discharge status: Home / Self Care | Payer: Self-pay | Source: Home / Self Care | Attending: Internal Medicine

## 2015-01-26 DIAGNOSIS — I639 Cerebral infarction, unspecified: Secondary | ICD-10-CM

## 2015-01-26 DIAGNOSIS — I340 Nonrheumatic mitral (valve) insufficiency: Secondary | ICD-10-CM

## 2015-01-26 HISTORY — PX: TEE WITHOUT CARDIOVERSION: SHX5443

## 2015-01-26 LAB — BASIC METABOLIC PANEL
Anion gap: 10 (ref 5–15)
BUN: 18 mg/dL (ref 6–20)
CALCIUM: 8.7 mg/dL — AB (ref 8.9–10.3)
CHLORIDE: 104 mmol/L (ref 101–111)
CO2: 24 mmol/L (ref 22–32)
CREATININE: 1.12 mg/dL (ref 0.61–1.24)
GFR calc non Af Amer: >60 mL/min (ref >60)
Glucose, Bld: 204 mg/dL — ABNORMAL HIGH (ref 65–99)
Potassium: 3.8 mmol/L (ref 3.5–5.1)
SODIUM: 138 mmol/L (ref 135–145)

## 2015-01-26 LAB — GLUCOSE, CAPILLARY
GLUCOSE-CAPILLARY: 198 mg/dL — AB (ref 65–99)
GLUCOSE-CAPILLARY: 259 mg/dL — AB (ref 65–99)
Glucose-Capillary: 94 mg/dL (ref 65–99)
Glucose-Capillary: 182 mg/dL — ABNORMAL HIGH (ref 65–99)

## 2015-01-26 SURGERY — ECHOCARDIOGRAM, TRANSESOPHAGEAL
Anesthesia: Moderate Sedation

## 2015-01-26 SURGERY — LOOP RECORDER INSERTION
Anesthesia: LOCAL

## 2015-01-26 MED ORDER — FENTANYL CITRATE (PF) 100 MCG/2ML IJ SOLN
INTRAMUSCULAR | Status: DC | PRN
  Administered 2015-01-26: 50 ug via INTRAVENOUS
  Administered 2015-01-26: 25 ug via INTRAVENOUS

## 2015-01-26 MED ORDER — SODIUM CHLORIDE 0.9 % IV SOLN
INTRAVENOUS | Status: DC
  Administered 2015-01-26: 500 mL via INTRAVENOUS

## 2015-01-26 MED ORDER — APIXABAN 5 MG PO TABS: 5.0000 mg | ORAL_TABLET | Freq: Two times a day (BID) | ORAL | Status: DC

## 2015-01-26 MED ORDER — BUTAMBEN-TETRACAINE-BENZOCAINE 2-2-14 % EX AERO
INHALATION_SPRAY | CUTANEOUS | Status: DC | PRN
  Administered 2015-01-26: 2 via TOPICAL

## 2015-01-26 MED ORDER — ASPIRIN 81 MG PO CHEW: 81.0000 mg | CHEWABLE_TABLET | Freq: Every day | ORAL | Status: DC

## 2015-01-26 MED ORDER — APIXABAN 5 MG PO TABS
5.0000 mg | ORAL_TABLET | Freq: Two times a day (BID) | ORAL | Status: DC
  Administered 2015-01-26: 5 mg via ORAL
  Filled 2015-01-26: qty 1

## 2015-01-26 MED ORDER — MIDAZOLAM HCL 5 MG/ML IJ SOLN
INTRAMUSCULAR | Status: AC
  Filled 2015-01-26: qty 2

## 2015-01-26 MED ORDER — FENTANYL CITRATE (PF) 100 MCG/2ML IJ SOLN
INTRAMUSCULAR | Status: AC
  Filled 2015-01-26: qty 2

## 2015-01-26 MED ORDER — MIDAZOLAM HCL 10 MG/2ML IJ SOLN
INTRAMUSCULAR | Status: DC | PRN
  Administered 2015-01-26 (×2): 2 mg via INTRAVENOUS

## 2015-01-26 NOTE — Progress Notes (Signed)
OT Cancellation Note  Patient Details Name: Jethro BolusStephen A Bassinger MRN: [id number] DOB: [birth year]   Cancelled Treatment:    Reason Eval/Treat Not Completed: Patient at procedure or test/ unavailable (Endo). Will attempt to see later today if time allows.  Nils PyleJulia Jarome Trull 01/26/2015, 8:19 AM  Pager: [phone]

## 2015-01-26 NOTE — Discharge Instructions (Signed)
Follow with Primary MD Tana Conch, MD in 7 days   Get CBC, CMP,  checked  by Primary MD next visit.    Activity: As tolerated with Full fall precautions use walker/cane & assistance as needed   Disposition Home    Diet: Heart Healthy  , with feeding assistance and aspiration precautions.  For Heart failure patients - Check your Weight same time everyday, if you gain over 2 pounds, or you develop in leg swelling, experience more shortness of breath or chest pain, call your Primary MD immediately. Follow Cardiac Low Salt Diet and 1.5 lit/day fluid restriction.   On your next visit with your primary care physician please Get Medicines reviewed and adjusted.   Please request your Prim.MD to go over all Hospital Tests and Procedure/Radiological results at the follow up, please get all Hospital records sent to your Prim MD by signing hospital release before you go home.   If you experience worsening of your admission symptoms, develop shortness of breath, life threatening emergency, suicidal or homicidal thoughts you must seek medical attention immediately by calling 911 or calling your MD immediately  if symptoms less severe.  You Must read complete instructions/literature along with all the possible adverse reactions/side effects for all the Medicines you take and that have been prescribed to you. Take any new Medicines after you have completely understood and accpet all the possible adverse reactions/side effects.   Do not drive, operating heavy machinery, perform activities at heights, swimming or participation in water activities or provide baby sitting services if your were admitted for syncope or siezures until you have seen by Primary MD or a Neurologist and advised to do so again.  Do not drive when taking Pain medications.    Do not take more than prescribed Pain, Sleep and Anxiety Medications  Special Instructions: If you have smoked or chewed Tobacco  in the last 2 yrs  please stop smoking, stop any regular Alcohol  and or any Recreational drug use.  Wear Seat belts while driving.   Please note  You were cared for by a hospitalist during your hospital stay. If you have any questions about your discharge medications or the care you received while you were in the hospital after you are discharged, you can call the unit and asked to speak with the hospitalist on call if the hospitalist that took care of you is not available. Once you are discharged, your primary care physician will handle any further medical issues. Please note that NO REFILLS for any discharge medications will be authorized once you are discharged, as it is imperative that you return to your primary care physician (or establish a relationship with a primary care physician if you do not have one) for your aftercare needs so that they can reassess your need for medications and monitor your lab values. Information on my medicine - ELIQUIS (apixaban)  This medication education was reviewed with me or my healthcare representative as part of my discharge preparation.  The pharmacist that spoke with me during my hospital stay was:  Elwin Sleight, [facility]  Why was Eliquis prescribed for you? Eliquis was prescribed for you to reduce the risk of forming blood clots that can cause a stroke if you have a medical condition called atrial fibrillation (a type of irregular heartbeat) OR to reduce the risk of a blood clots forming after orthopedic surgery.  What do You need to know about Eliquis ? Take your Eliquis TWICE DAILY - one tablet  in the morning and one tablet in the evening with or without food.  It would be best to take the doses about the same time each day.  If you have difficulty swallowing the tablet whole please discuss with your pharmacist how to take the medication safely.  Take Eliquis exactly as prescribed by your doctor and DO NOT stop taking Eliquis without talking to the doctor who  prescribed the medication.  Stopping may increase your risk of developing a new clot or stroke.  Refill your prescription before you run out.  After discharge, you should have regular check-up appointments with your healthcare provider that is prescribing your Eliquis.  In the future your dose may need to be changed if your kidney function or weight changes by a significant amount or as you get older.  What do you do if you miss a dose? If you miss a dose, take it as soon as you remember on the same day and resume taking twice daily.  Do not take more than one dose of ELIQUIS at the same time.  Important Safety Information A possible side effect of Eliquis is bleeding. You should call your healthcare provider right away if you experience any of the following: ? Bleeding from an injury or your nose that does not stop. ? Unusual colored urine (red or dark brown) or unusual colored stools (red or black). ? Unusual bruising for unknown reasons. ? A serious fall or if you hit your head (even if there is no bleeding).  Some medicines may interact with Eliquis and might increase your risk of bleeding or clotting while on Eliquis. To help avoid this, consult your healthcare provider or pharmacist prior to using any new prescription or non-prescription medications, including herbals, vitamins, non-steroidal anti-inflammatory drugs (NSAIDs) and supplements.  This website has more information on Eliquis (apixaban): [email or web address].

## 2015-01-26 NOTE — Progress Notes (Signed)
Pt discharged to home via car with wife.  He verbalizes he understands all discharge instructions, future appointments, etc.  He was given a prescription for Eliquis and CM gave him information and packets.  He had all belongings with him. Verbalizes received excellent care.

## 2015-01-26 NOTE — Consult Note (Signed)
ELECTROPHYSIOLOGY CONSULT NOTE  Patient ID: John Carrillo MRN: [id number], DOB/AGE: 03-13-1950   Admit date: 01/24/2015 Date of Consult: 01/26/2015  Primary Physician: Tana Conch, MD Primary Cardiologist: Dr. [name]  Reason for Consultation: Cryptogenic stroke - 01/24/15; recommendations regarding Implantable Loop Recorder  History of Present Illness John Carrillo was admitted on 01/24/2015 with new/recurrent CVA.  His PMHx is of CAD recently presented to [facility] with and SVT/CP underwent cath and PCI to RCA with DES 01/16/15 and is on DAPT, ICM with known EF 15% at that time (no ICD pending re-eval of his EF post intervention), HTN, HLD, and prior CVA.  They first developed symptoms of acute vision loss in the left eye the day of admission while playing pictionary.  Imaging demonstrated Non-dominant right occipital lobe infarct in setting of previous L MCA cortical infarcts, all felt to be Embolic secondary to low EF source.  he has undergone workup for stroke including echocardiogram and carotid dopplers.  The patient has been monitored on telemetry which has demonstrated sinus rhythm with no arrhythmias.  Inpatient stroke work-up is to be completed with a TEE this morning and his carotid US result is pending this morning.  He feels well this morning, denies any kind of symptoms.  Echocardiogram 01/16/15 demonstrated  Study Conclusions - Left ventricle: Diffuse hypokinesis septal and apical akinesis. The cavity size was severely dilated. Wall thickness was normal. The estimated ejection fraction was 15%. - Mitral valve: There was mild regurgitation. - Left atrium: The atrium was mildly dilated. - Right atrium: The atrium was mildly dilated. - Atrial septum: No defect or patent foramen ovale was identified. - Tricuspid valve: There was moderate regurgitation. - Pulmonary arteries: PA peak pressure: 59 mm Hg (S). LA 44mm  Lab work is reviewed.  Prior to admission, the  patient denies chest pain, shortness of breath, dizziness, palpitations, or syncope.   EP has been asked to evaluate for placement of an implantable loop recorder to monitor for atrial fibrillation.     Past Medical History  Diagnosis Date  . CORONARY ARTERY DISEASE 08/11/2006  . CVA 12/19/2006    2008  . DIABETES MELLITUS, TYPE I 08/11/2006  . ECZEMA, HANDS 12/15/2009  . HYPERLIPIDEMIA 08/11/2006  . HYPERTENSION 08/11/2006  . MYOCARDIAL INFARCTION, HX OF 08/11/2006    07/14/01     Surgical History:  Past Surgical History  Procedure Laterality Date  . Tonsilectomy, adenoidectomy, bilateral myringotomy and tubes    . Coronary stent placement    . Cardiac catheterization N/A 01/16/2015    Procedure: Left Heart Cath and Coronary Angiography;  Surgeon: [name], MD;  [facility] INVASIVE CV LAB;  Service: Cardiovascular;  Laterality: N/A;     Prescriptions prior to admission  Medication Sig Dispense Refill Last Dose  . acetaminophen (TYLENOL) 325 MG tablet Take 2 tablets (650 mg total) by mouth every 4 (four) hours as needed for headache or mild pain.   01/24/2015 at Unknown time  . aspirin 81 MG chewable tablet Chew 1 tablet (81 mg total) by mouth daily.   01/24/2015 at Unknown time  . atorvastatin (LIPITOR) 40 MG tablet Take 1 tablet (40 mg total) by mouth daily. 30 tablet 11 01/24/2015 at Unknown time  . carvedilol (COREG) 6.25 MG tablet Take 1 tablet (6.25 mg total) by mouth 2 (two) times daily with a meal. 60 tablet 11 01/24/2015 at 1800  . ibuprofen (ADVIL,MOTRIN) 200 MG tablet Take 400 mg by mouth every 6 (six) hours  as needed for moderate pain.    01/24/2015 at Unknown time  . nitroGLYCERIN (NITROSTAT) 0.4 MG SL tablet Place 1 tablet (0.4 mg total) under the tongue every 5 (five) minutes as needed for chest pain. Chest pain 30 tablet 5 unknown  . NOVOLOG MIX 70/30 FLEXPEN (70-30) 100 UNIT/ML FlexPen Inject 12-30 Units into the skin 2 (two) times daily with a meal. Inject 12  units in the morning and 25-30 units in the evening depending on blood glucose reading  1 01/24/2015 at Unknown time  . Probiotic Product (PROBIOTIC PO) Take 1 capsule by mouth every morning.   01/24/2015 at Unknown time  . ramipril (ALTACE) 5 MG capsule Take 1 capsule (5 mg total) by mouth 2 (two) times daily. 60 capsule 11 01/24/2015 at Unknown time  . spironolactone (ALDACTONE) 25 MG tablet Take 1 tablet (25 mg total) by mouth daily. 30 tablet 11 01/24/2015 at Unknown time  . ticagrelor (BRILINTA) 90 MG TABS tablet Take 1 tablet (90 mg total) by mouth 2 (two) times daily. 60 tablet 11 01/24/2015 at Unknown time  . glucose blood (ONETOUCH VERIO) test strip 1 each by Other route 4 (four) times daily. And lances 4/day E11.9 120 each 3 Taking  . Insulin Pen Needle (BD ULTRA-FINE PEN NEEDLES) 29G X 12.7MM MISC use as directed twice a day 200 each 2 Taking    Inpatient Medications:  . aspirin  81 mg Oral Daily  . atorvastatin  40 mg Oral Daily  . carvedilol  6.25 mg Oral BID WC  . enoxaparin (LOVENOX) injection  40 mg Subcutaneous Q24H  . insulin aspart protamine- aspart  12 Units Subcutaneous Q breakfast  . insulin aspart protamine- aspart  25 Units Subcutaneous Q supper  . saccharomyces boulardii  250 mg Oral q morning - 10a  . ticagrelor  90 mg Oral BID    Allergies: No Known Allergies  Social History   Social History  . Marital Status: Married    Spouse Name: N/A  . Number of Children: N/A  . Years of Education: N/A   Occupational History  . Writer   Social History Main Topics  . Smoking status: Former Smoker -- 1.00 packs/day for 30 years    Types: Cigarettes    Quit date: 05/03/2000  . Smokeless tobacco: Not on file  . Alcohol Use: 0.0 oz/week    0 Standard drinks or equivalent per week     Comment: 1-2/month  . Drug Use: No  . Sexual Activity: Not on file   Other Topics Concern  . Not on file   Social History Narrative   Married (kids  are patients here, wife seen elsewhere). 3 step children, 1 biological. No grandkids.    In late 2016:   35 yo sophomore at [employer]- consider age 60 retirement   Oldest daughter - college of [employer] attorney- social work intl business minor religion      Emergency planning/management officer for [employer]      Hobbies: race sail boats, adrenaline related activities     Family History  Problem Relation Age of Onset  . Diabetes Neg Hx   . CAD Father     age 14  . CAD Paternal Grandfather     4s  . CAD Paternal Grandmother     44s  . Brain cancer Maternal Grandmother       Review of Systems: All other systems reviewed and are otherwise negative except as noted above.  Physical Exam: Ceasar Mons  Vitals:   01/25/15 2100 01/25/15 2137 01/26/15 0207 01/26/15 0627  BP:  115/77 118/83 118/75  Pulse:  58 62 95  Temp:  98 F (36.7 C) 98.3 F (36.8 C) 98 F (36.7 C)  TempSrc:  Oral Oral Oral  Resp:  Height:  (1.753 m)     Weight: 173 lb (78.472 kg)     SpO2:  100% 100% 97%    GEN- The patient is well appearing, alert and oriented x 3 today.   Head- normocephalic, atraumatic Eyes-  Sclera clear, conjunctiva pink Ears- hearing intact Oropharynx- clear Neck- supple Lungs- Clear to ausculation bilaterally, normal work of breathing Heart- Regular rate and rhythm, no murmurs, rubs or gallops  GI- soft, NT, ND Extremities- no clubbing, cyanosis, or edema MS- no significant deformity or atrophy Skin- no rash or lesion Psych- euthymic mood, full affect   Labs:   Lab Results  Component Value Date   WBC 7.2 01/24/2015   HGB 14.7 01/24/2015   HCT 44.1 01/24/2015   MCV 98.4 01/24/2015   PLT 232 01/24/2015    Recent Labs Lab 01/24/15 2118  01/26/15 0340  NA 135  < > 138  K 5.5*  < > 3.8  CL 99*  < > 104  CO2 28  < > 24  BUN 20  < > 18  CREATININE 1.86*  < > 1.12  CALCIUM 9.4  < > 8.7*  PROT 6.1*  --   --   BILITOT 0.8  --   --   ALKPHOS 60  --   --   ALT 38  --   --   AST 30   --   --   GLUCOSE 551*  < > 204*  < > = values in this interval not displayed. Lab Results  Component Value Date   CKTOTAL 192 12/14/2006   CKMB 2.0 12/14/2006   TROPONINI 0.13* 01/15/2015   Lab Results  Component Value Date   CHOL 170 01/25/2015   CHOL 252* 06/24/2014   CHOL 172 11/26/2013   Lab Results  Component Value Date   HDL 55 01/25/2015   HDL 61.70 06/24/2014   HDL 58 11/26/2013   Lab Results  Component Value Date   LDLCALC 102* 01/25/2015   LDLCALC 172* 06/24/2014   LDLCALC 102* 11/26/2013   Lab Results  Component Value Date   TRIG 63 01/25/2015   TRIG 94.0 06/24/2014   TRIG 60 11/26/2013   Lab Results  Component Value Date   CHOLHDL 3.1 01/25/2015   CHOLHDL 4 06/24/2014   CHOLHDL 3.0 11/26/2013   Lab Results  Component Value Date   LDLDIRECT 100.0 12/23/2014   LDLDIRECT 133.0 12/01/2009    No results found for: DDIMER   Radiology/Studies:  Ct Head Wo Contrast 01/24/2015  CLINICAL DATA:  Left visual loss with left-sided headaches EXAM: CT HEAD WITHOUT CONTRAST TECHNIQUE: Contiguous axial images were obtained from the base of the skull through the vertex without intravenous contrast. COMPARISON:  12/13/2006 FINDINGS: Bony calvarium is intact. Changes of prior ischemia are noted in the left parietal lobe posteriorly. No acute areas of ischemia are noted. No hemorrhage or space-occupying mass lesion is seen. IMPRESSION: Chronic changes without acute abnormality. Electronically Signed   By: Alcide Clever M.D.   On: 01/24/2015 21:48   Mr Maxine Glenn Head Wo Contrast 01/25/2015  CLINICAL DATA:  Temporary LEFT eye blindness concerning for transient ischemic attack. LEFT-sided headaches. EXAM: MRI HEAD WITHOUT CONTRAST MRA  HEAD WITHOUT CONTRAST TECHNIQUE: Multiplanar, multiecho pulse sequences of the brain and surrounding structures were obtained without intravenous contrast. Angiographic images of the head were obtained using MRA technique without contrast. COMPARISON:   CT head February 01, 2015 MRI of the brain December 14, 2006 FINDINGS: MRI HEAD FINDINGS The ventricles and sulci are normal for patient's age. No abnormal parenchymal signal, mass lesions, mass effect. 11 mm focus of faint reduced diffusion in RIGHT periatrial white matter, occipital lobe with low ADC value. Ventricles and sulci are normal for patient's age. Patchy supratentorial white matter T2 hyperintensities. No midline shift, mass effect or mass lesions. LEFT periatrial white matter T2 hyperintensity with low T1 signal. No susceptibility artifact to suggest hemorrhage. No abnormal extra-axial fluid collections. No extra-axial masses though, contrast enhanced sequences would be more sensitive. Normal major intracranial vascular flow voids seen at the skull base. Ocular globes and orbital contents are unremarkable though not tailored for evaluation. No abnormal sellar expansion. No suspicious calvarial bone marrow signal. Craniocervical junction maintained. RIGHT sphenoid mucosal retention cyst without paranasal sinus air-fluid levels. MRA HEAD FINDINGS Anterior circulation: Normal flow related enhancement of the included cervical, petrous, cavernous and supraclinoid internal carotid arteries. Patent anterior communicating artery. Normal flow related enhancement of the anterior and middle cerebral arteries, including distal segments. Mild luminal irregularity of the middle cerebral arteries. No large vessel occlusion, high-grade stenosis, aneurysm. Posterior circulation: LEFT vertebral artery is dominant. Basilar artery is patent, with normal flow related enhancement of the main branch vessels. Robust LEFT, small RIGHT posterior communicating artery present. Loss of flow related enhancement in distal RIGHT posterior cerebral artery. No large vessel occlusion, high-grade stenosis, abnormal luminal irregularity, aneurysm. IMPRESSION: MRI HEAD: 11 mm focus of acute ischemia RIGHT occipital lobe. Old LEFT MCA  territory infarcts and mild to moderate chronic small vessel ischemic disease. MRA HEAD: Distal RIGHT posterior cerebral artery occlusion versus artifact. Mild luminal irregularity of the middle cerebral arteries compatible with atherosclerosis. Electronically Signed   By: Awilda Metro M.D.   On: 01/25/2015 03:17   Mr Brain Wo Contrast 01/25/2015  CLINICAL DATA:  Temporary LEFT eye blindness concerning for transient ischemic attack. LEFT-sided headaches. EXAM: MRI HEAD WITHOUT CONTRAST MRA HEAD WITHOUT CONTRAST TECHNIQUE: Multiplanar, multiecho pulse sequences of the brain and surrounding structures were obtained without intravenous contrast. Angiographic images of the head were obtained using MRA technique without contrast. COMPARISON:  CT head February 01, 2015 MRI of the brain December 14, 2006 FINDINGS: MRI HEAD FINDINGS The ventricles and sulci are normal for patient's age. No abnormal parenchymal signal, mass lesions, mass effect. 11 mm focus of faint reduced diffusion in RIGHT periatrial white matter, occipital lobe with low ADC value. Ventricles and sulci are normal for patient's age. Patchy supratentorial white matter T2 hyperintensities. No midline shift, mass effect or mass lesions. LEFT periatrial white matter T2 hyperintensity with low T1 signal. No susceptibility artifact to suggest hemorrhage. No abnormal extra-axial fluid collections. No extra-axial masses though, contrast enhanced sequences would be more sensitive. Normal major intracranial vascular flow voids seen at the skull base. Ocular globes and orbital contents are unremarkable though not tailored for evaluation. No abnormal sellar expansion. No suspicious calvarial bone marrow signal. Craniocervical junction maintained. RIGHT sphenoid mucosal retention cyst without paranasal sinus air-fluid levels. MRA HEAD FINDINGS Anterior circulation: Normal flow related enhancement of the included cervical, petrous, cavernous and supraclinoid  internal carotid arteries. Patent anterior communicating artery. Normal flow related enhancement of the anterior and middle cerebral arteries, including distal  segments. Mild luminal irregularity of the middle cerebral arteries. No large vessel occlusion, high-grade stenosis, aneurysm. Posterior circulation: LEFT vertebral artery is dominant. Basilar artery is patent, with normal flow related enhancement of the main branch vessels. Robust LEFT, small RIGHT posterior communicating artery present. Loss of flow related enhancement in distal RIGHT posterior cerebral artery. No large vessel occlusion, high-grade stenosis, abnormal luminal irregularity, aneurysm. IMPRESSION: MRI HEAD: 11 mm focus of acute ischemia RIGHT occipital lobe. Old LEFT MCA territory infarcts and mild to moderate chronic small vessel ischemic disease. MRA HEAD: Distal RIGHT posterior cerebral artery occlusion versus artifact. Mild luminal irregularity of the middle cerebral arteries compatible with atherosclerosis. Electronically Signed   By: Awilda Metroourtnay  Bloomer M.D.   On: 01/25/2015 03:17     12-lead ECG SR, 1st degree AV block,LBBB All prior EKG's in EPIC reviewed with no documented atrial fibrillation, noting SVT 01/14/15 admission  Telemetry SR only  Assessment and Plan:  1. Cryptogenic stroke The patient presents with cryptogenic stroke.  The patient has a TEE planned for this AM, carotids are pending.  I spoke at length with the patient about monitoring for afib with either a 30 day event monitor or an implantable loop recorder.  Risks, benefits, and alteratives to implantable loop recorder were discussed with the patient today.   At this time, the patient is very clear in his decision not to proceed with implantable loop recorder with concerns about his line of work and a device that has the compability to transmit signal.  A [employer] representative visited with the patient though he remained concerned and at least as of now, he  does not want to proceed with loop implant.  In regards to a event monitor, he said he would let us know if/when he was ready to proceed with this option.   Please call with questions.   Renee Norberto SorensonLynn Ursuy, PA-C 01/26/2015  EP Attending  I agree with the plan as noted above. The patient was not seen by me. He preferred not to undergo insertion of an ILR. He will let us know if he wishes to proceed with ILR.  Leonia ReevesGregg Keeley Sussman,M.D.

## 2015-01-26 NOTE — Progress Notes (Signed)
PT Cancellation Note  Patient Details Name: John Carrillo MRN: [id number] DOB: 25-Oct-1950   Cancelled Treatment:    Reason Eval/Treat Not Completed: Patient at procedure or test/unavailable   [name] 01/26/2015, 8:44 AM Skip Mayerary Ethan Kasperski PT [phone]

## 2015-01-26 NOTE — Op Note (Signed)
INDICATIONS: cryptogenic stroke  PROCEDURE:   Informed consent was obtained prior to the procedure. The risks, benefits and alternatives for the procedure were discussed and the patient comprehended these risks.  Risks include, but are not limited to, cough, sore throat, vomiting, nausea, somnolence, esophageal and stomach trauma or perforation, bleeding, low blood pressure, aspiration, pneumonia, infection, trauma to the teeth and death.    After a procedural time-out, the oropharynx was anesthetized with 20% benzocaine spray. The patient was given 4 mg versed and 75 mcg fentanyl for moderate sedation.   The transesophageal probe was inserted in the esophagus and stomach without difficulty and multiple views were obtained.  The patient was kept under observation until the patient left the procedure room.  The patient left the procedure room in stable condition.   Agitated microbubble saline contrast was administered.  COMPLICATIONS:    There were no immediate complications.  FINDINGS:  Severe LV dysfunction with anteroapical akinesis. No thrombus seen, but prominent spontaneous echo contrast is seen in the LV apex, the left atrium and atrial appendage. No intracardiac shunt. There is late arrival of contrast in the left atrium, pulmonary recirculation.   RECOMMENDATIONS:   Consider anticoagulation, although obvious increased bleeding risk due to mandatory dual antiplatelet therapy. Discussed with Dr. SwazilandJordan.  Time Spent Directly with the Patient:   60 minutes   John Carrillo 01/26/2015, 8:26 AM

## 2015-01-26 NOTE — Progress Notes (Signed)
STROKE TEAM PROGRESS NOTE   HISTORY John BolusStephen A Carrillo is an 64 y.o. male hx of CAD, prior CVA, DM, HLD, HTN presenting with sudden change in vision in his left eye. He notes sudden loss of vision in his left eye. Reports vision loss was the majority of the temporal visual field with a crescent shaped intact visual field in his nasal field of the left eye. Reports intact vision and visual fields in his right eye. Denies any associated weakness, sensory or speech deficits. Symptoms lasted around 1-2 hours and is now back to his baseline. Had recent heart cath and stent placement 1 week ago. Had 2D echo on 12/12 showing LV EF of 15% with diffuse hypokinesis. No eye pain, no visual auras during the episode. No jaw claudication, no temporal pain.   Also had transient left periorbital pressure headache during this episode. Not his typical migraine headache. Patient does have a history of migraine with visual aura though he has never had a visual deficit during a migraine.   CT head imaging reviewed, no acute process noted.   Patient was not administered TPA secondary to resolved symptoms. He was admitted for further evaluation and treatment.   SUBJECTIVE (INTERVAL HISTORY) His wufe is at the bedside.  Overall he feels his condition is stable. Patient returned from TEE  OBJECTIVE Temp:  [97.6 F (36.4 C)-98.3 F (36.8 C)] 97.6 F (36.4 C) (12/22 1014) Pulse Rate:  [55-95] 62 (12/22 1014) Cardiac Rhythm:  [-] Sinus bradycardia;Heart block (12/21 1900) Resp:  [11-18] 16 (12/22 1014) BP: (115-132)/(75-92) 122/75 mmHg (12/22 1014) SpO2:  [94 %-100 %] 100 % (12/22 1014) Weight:  [173 lb (78.472 kg)] 173 lb (78.472 kg) (12/21 2100)  CBC:   Recent Labs Lab 01/24/15 2116 01/24/15 2118  WBC  --  7.2  NEUTROABS  --  3.8  HGB 16.3 14.7  HCT 48.0 44.1  MCV  --  98.4  PLT  --  232    Basic Metabolic Panel:   Recent Labs Lab 01/25/15 0626 01/26/15 0340  NA 139 138  K 4.2 3.8  CL 106 104   CO2 27 24  GLUCOSE 207* 204*  BUN 23* 18  CREATININE 1.41* 1.12  CALCIUM 8.6* 8.7*    Lipid Panel:     Component Value Date/Time   CHOL 170 01/25/2015 0626   TRIG 63 01/25/2015 0626   HDL 55 01/25/2015 0626   CHOLHDL 3.1 01/25/2015 0626   VLDL 13 01/25/2015 0626   LDLCALC 102* 01/25/2015 0626   HgbA1c:  Lab Results  Component Value Date   HGBA1C 9.3* 01/15/2015   Urine Drug Screen: No results found for: LABOPIA, COCAINSCRNUR, LABBENZ, AMPHETMU, THCU, LABBARB    IMAGING  Ct Head Wo Contrast 01/24/2015  Chronic changes without acute abnormality.   MRI HEAD:  01/25/2015  11 mm focus of acute ischemia RIGHT occipital lobe. Old LEFT MCA territory infarcts and mild to moderate chronic small vessel ischemic disease.   MRA HEAD:  01/25/2015  Distal RIGHT posterior cerebral artery occlusion versus artifact. Mild luminal irregularity of the middle cerebral arteries compatible with atherosclerosis.   2D Echocardiogram  - Left ventricle: Diffuse hypokinesis septal and apical akinesis. The cavity size was severely dilated. Wall thickness was normal.The estimated ejection fraction was 15%. - Mitral valve: There was mild regurgitation. - Left atrium: The atrium was mildly dilated. - Right atrium: The atrium was mildly dilated. - Atrial septum: No defect or patent foramen ovale was identified. - Tricuspid valve:  There was moderate regurgitation. - Pulmonary arteries: PA peak pressure: 59 mm Hg (S).   PHYSICAL EXAM Frail middle-aged Caucasian male not i.n distress. . Afebrile. Head is nontraumatic. Neck is supple without bruit.    Cardiac exam no murmur or gallop. Lungs are clear to auscultation. Distal pulses are well felt. Neurological Exam ;  Awake  Alert oriented x 3. Normal speech and language.eye movements full without nystagmus.fundi were not visualized. Vision acuity and fields appear normal. Hearing is normal. Palatal movements are normal. Face symmetric. Tongue  midline. Normal strength, tone, reflexes and coordination. Normal sensation. Gait deferred. ASSESSMENT/PLAN John Carrillo is a 64 y.o. male with history of CAD, prior CVA, DM, HLD, HTN presenting with loss of vision in his L eye. He did not receive IV t-PA due to resolved symptoms.   Stroke:  Non-dominant right occipital lobe infarct in setting of previous L MCA cortical infarcts, all felt to be  Embolic secondary to low EF source  Resultant  Neurologic deficits resolved  MRI  Acute right occipital lobe and old L MCA territory infarcts. small vessel disease   MRA  Distal R PCA occlusion  Carotid Doppler  pending   2D Echo  EF 15% TEE Severe LV dysfunction with anteroapical akinesis. No thrombus seen, but prominent spontaneous echo contrast is seen in the LV apex, the left atrium and atrial appendage.  No intracardiac shunt.    LDL 102  HgbA1c 9.3  Lovenox 40 mg sq daily for VTE prophylaxis Diet Heart Room service appropriate?: Yes; Fluid consistency:: Thin  aspirin 81 mg daily and Brilinta prior to admission, now on aspirin 81 mg daily and Brilinta. Given low EF, discussed treatment options with pt and reasoning for various options. For now, will leave and discuss.  Ongoing aggressive stroke risk factor management  Therapy recommendations:  No therapy needs  Disposition:  Return home  Hypertension  Stable  Hyperlipidemia  Home meds:  lipitor 40, resumed in hospital  LDL 102, goal < 70  Continue statin at discharge  Diabetes type I  HgbA1c 9.3, goal < 7.0  Uncontrolled  Other Stroke Risk Factors  Advanced age  Former Cigarette smoker, quit smoking 14 years ago   ETOH use  hx drug use  Obesity, Body mass index is 25.54 kg/(m^2).   Hx stroke/TIA - 2008 without deficits  Coronary artery disease; SVD, chronic systolic HF  s/p stent last week MI  Seen by Dr. [name] 01/17/2015 for LifeVest consideration. Recommended repeat echo in 3 months. If  EF remains depressed despite optimal medical therapy, would recommend CRTD at that time.   Atypical hx of occular migraine   Other Active Problems  CKD stage III  Hospital day # 1  Annalucia Laino  [facility] See Amion for Pager information 01/26/2015 1:11 PM  I have personally examined this patient, reviewed notes, independently viewed imaging studies, participated in medical decision making and plan of care. I have made any additions or clarifications directly to the above note. Agree with note above. He presented with transient left hand. Vision loss likely from a cardioembolic right parieto-occipital infarct.TEE shows no definite clot but left atrial and LV apex smoke which are precursors for clot and place him at high risk of cardioembolic strokes necessitating anticoagulation for stroke prevention. Patient clearly needs to be on dual antiplatelet therapy given recent cardiac stent 10 days ago.Discussed with Dr Jomarie Longs and Mliss Fritz who all are in agreement.Continue aspirin, brillinta and eliquis 5 mg bid x  4 weeks then stop aspirin and continue brillinta and eliquis long term.No need for loop recorder at present time. I had a long discussion with the patient and his wife and answered questions. I counseled him to avoid injury and to watch for bleeding.Follow up in stroke clinic 2 months. Call for questions.  Delia Heady, MD Medical Director [facility] Pager: [phone] 01/26/2015 1:11 PM    To contact Stroke Continuity provider, please refer to [email or web address]. After hours, contact General Neurology

## 2015-01-26 NOTE — Progress Notes (Signed)
OT Cancellation Note  Patient Details Name: John Carrillo MRN: [id number] DOB: 08/11/1950   Cancelled Treatment:    Reason Eval/Treat Not Completed: OT screened, no needs identified, will sign off. Pt is independent/back to baseline with ADLs and mobility and vision deficits have completely resolved.   Nils PyleJulia Chameka Mcmullen, OTR/L Pager: [phone] 01/26/2015, 1:26 PM

## 2015-01-26 NOTE — Interval H&P Note (Signed)
History and Physical Interval Note:  01/26/2015 8:18 AM  Jethro BolusStephen A Sousa  has presented today for surgery, with the diagnosis of STROKE  The various methods of treatment have been discussed with the patient and family. After consideration of risks, benefits and other options for treatment, the patient has consented to  Procedure(s): TRANSESOPHAGEAL ECHOCARDIOGRAM (TEE) (N/A) as a surgical intervention .  The patient's history has been reviewed, patient examined, no change in status, stable for surgery.  I have reviewed the patient's chart and labs.  Questions were answered to the patient's satisfaction.     Kamron Vanwyhe

## 2015-01-26 NOTE — Progress Notes (Signed)
Echocardiogram Echocardiogram Transesophageal has been performed.  John Carrillo, John Carrillo 01/26/2015, 10:11 AM

## 2015-01-26 NOTE — Progress Notes (Signed)
Pt refused insulin morning.  States he prefers to take after he returns from TEE.

## 2015-01-26 NOTE — Discharge Summary (Signed)
John Carrillo, is a 64 y.o. male  DOB 06/16/1950  MRN [id number].  Admission date:  01/24/2015  Admitting Physician  Clydie Braun, MD  Discharge Date:  01/26/2015   Primary MD  Tana Conch, MD  Recommendations for primary care physician for things to follow:  - Check basic labs including CBC, BMP during next visit. - Patient to continue with aspirin for another 20 days then to stop after that(total of 30 days post stent placement) -  to follow with cardiology in 2 weeks from discharge - To follow with neurology in 2 months from discharge   Admission Diagnosis  Hyperkalemia [E87.5] Elevated glucose [R73.09] Vision loss of left eye [H54.62] Blindness of left eye [H54.42] AKI (acute kidney injury) (HCC) [N17.9]   Discharge Diagnosis  Hyperkalemia [E87.5] Elevated glucose [R73.09] Vision loss of left eye [H54.62] Blindness of left eye [H54.42] AKI (acute kidney injury) (HCC) [N17.9]    Principal Problem:   Visual loss, left eye Active Problems:   Essential hypertension   CAD S/P LAD PCI'03, RCA DES 01/16/15   CVA (cerebral infarction)   LBBB (left bundle branch block)   Hyperkalemia   Diabetes mellitus with hyperglycemia (HCC)   Acute kidney injury superimposed on chronic kidney disease (HCC)   Stroke (HCC)   Chronic systolic CHF (congestive heart failure) (HCC)   Embolic stroke (HCC)   Stroke (cerebrum) (HCC)      Past Medical History  Diagnosis Date  . CORONARY ARTERY DISEASE 08/11/2006  . CVA 12/19/2006    2008  . DIABETES MELLITUS, TYPE I 08/11/2006  . ECZEMA, HANDS 12/15/2009  . HYPERLIPIDEMIA 08/11/2006  . HYPERTENSION 08/11/2006  . MYOCARDIAL INFARCTION, HX OF 08/11/2006    07/14/01    Past Surgical History  Procedure Laterality Date  . Tonsilectomy, adenoidectomy, bilateral myringotomy and tubes    . Coronary stent placement    . Cardiac catheterization N/A 01/16/2015      Procedure: Left Heart Cath and Coronary Angiography;  Surgeon: [name], MD;  Location: [facility] INVASIVE CV LAB;  Service: Cardiovascular;  Laterality: N/A;       History of present illness and  Hospital Course:     Kindly see H&P for history of present illness and admission details, please review complete Labs, Consult reports and Test reports for all details in brief  HPI  from the history and physical done on the day of admission on 01/26/2015 Patient is a 64 y/o male with a past medical history of CAD s/p stent (last week), cva w/o residual deficit 8 years ago, and CKD stage III; who present with acute onset of left eye vision loss. Symptoms started prior to dinner at 9-9:30 pm. Lost vision out of left eye field except for a crescent slither of the medial aspect of his visual field. He notified his wife and she brought him to the hospital. During ride to the hospital patient notes that slowly regained vision out of the left eye laterally with complete vision restored 1-2 hours after  onset. Associated symptoms include a right sided nasal pressure. Denies any eye discharge, redness, or tearing during the event in question. Reports last eye exam 2 years ago and was told no signs of glaucoma or diabetic retinopathy. Patient recently had a cardiac cath with stent placement one week ago. His last echo showed EF of 15% with diffuse hypokinesis. Patient reports that his hemoglobin A1c was elevated at 9 last week when checked. He also reports associated symptoms of decreased urine output and increased thirst the last few days.   Upon arrival to the emergency department initial CT scan showed no acute infarction. Neurology and ophthalmology consulted in the ED. Initial lab work showed elevated blood glucose 551, anion gap 8, potassium 5.5, Cr 1.86, BUN 20    Hospital Course  64 y.o. male with hx of CAD, prior CVA, DM, HLD, HTN, presenting with sudden vision loss in his left eye, MRI with  evidence of acute CVA in right occipital lobe.  Acute CVA - MRI with evidence of acute CVA in nondominant right occipital lobe infarct in the setting of previous L MCA cortical infarcts. - Neurology consult appreciated, felt to be embolic secondary to low EF , patient had TEE 12/22, no thrombus seen, but prominent spontaneous echo contrast is seen in LV apex, as discussed with the neurology Dr. Pearlean Browniesethi and cardiology Dr. Loney Heringritoru would start patient on anticoagulation with Eliquis, continue with Brilinta given  recent stent, and to continue with aspirin total of 30 days post stent placement(another 20 days as an outpatient), and to follow with cardiology and nephrology as an outpatient. - MRI brain acute right occipital lobe and old L MCA territory infarct - MRA distal right PCA occlusion - 2-D echo, done recently, EF 15% - Weighted Doppler pending - LDL 102 - Hemoglobin A1c 9.3 - Continue with Brilinta, started on Eliquis, patient to stop aspirin in 20 days(to finish total 30 days post stent placement)  Hypertension - Continue with home medication  Hyperlipidemia - On Lipitor  CAD - Recent stent placement, continue with aspirin and Brilinta.  Ischemic cardiomyopathy EF 15% - Continue with beta blockers, Aldactone, ramipril,   Acute on chronic kidney disease stage III - Resolved, back to baseline  Diabetes mellitus - Continue  with home meds      Discharge Condition:  stable   Follow UP  Follow-up Information    Follow up with Tana ConchStephen Hunter, MD. Schedule an appointment as soon as possible for a visit in 1 week.   Specialty:  Family Medicine   Contact information:   [address]  [place] [address] [phone]       Follow up with SETHI,PRAMOD, MD. Schedule an appointment as soon as possible for a visit in 2 months.   Specialties:  Neurology, Radiology   Contact information:   [address] [place] [address] [phone]       Follow  up with Charlton HawsPeter Nishan, MD In 2 weeks.   Specialty:  Cardiology   Contact information:   [address] [place] [address] [phone]         Discharge Instructions  and  Discharge Medications    Discharge Instructions    Discharge instructions    Complete by:  As directed   Follow with Primary MD Tana ConchStephen Hunter, MD in 7 days   Get CBC, CMP,  checked  by Primary MD next visit.    Activity: As tolerated with Full fall precautions use walker/cane &  assistance as needed   Disposition Home    Diet: Heart Healthy  , with feeding assistance and aspiration precautions.  For Heart failure patients - Check your Weight same time everyday, if you gain over 2 pounds, or you develop in leg swelling, experience more shortness of breath or chest pain, call your Primary MD immediately. Follow Cardiac Low Salt Diet and 1.5 lit/day fluid restriction.   On your next visit with your primary care physician please Get Medicines reviewed and adjusted.   Please request your Prim.MD to go over all Hospital Tests and Procedure/Radiological results at the follow up, please get all Hospital records sent to your Prim MD by signing hospital release before you go home.   If you experience worsening of your admission symptoms, develop shortness of breath, life threatening emergency, suicidal or homicidal thoughts you must seek medical attention immediately by calling 911 or calling your MD immediately  if symptoms less severe.  You Must read complete instructions/literature along with all the possible adverse reactions/side effects for all the Medicines you take and that have been prescribed to you. Take any new Medicines after you have completely understood and accpet all the possible adverse reactions/side effects.   Do not drive, operating heavy machinery, perform activities at heights, swimming or participation in water activities or provide baby sitting services if your were  admitted for syncope or siezures until you have seen by Primary MD or a Neurologist and advised to do so again.  Do not drive when taking Pain medications.    Do not take more than prescribed Pain, Sleep and Anxiety Medications  Special Instructions: If you have smoked or chewed Tobacco  in the last 2 yrs please stop smoking, stop any regular Alcohol  and or any Recreational drug use.  Wear Seat belts while driving.   Please note  You were cared for by a hospitalist during your hospital stay. If you have any questions about your discharge medications or the care you received while you were in the hospital after you are discharged, you can call the unit and asked to speak with the hospitalist on call if the hospitalist that took care of you is not available. Once you are discharged, your primary care physician will handle any further medical issues. Please note that NO REFILLS for any discharge medications will be authorized once you are discharged, as it is imperative that you return to your primary care physician (or establish a relationship with a primary care physician if you do not have one) for your aftercare needs so that they can reassess your need for medications and monitor your lab values.     Increase activity slowly    Complete by:  As directed             Medication List    STOP taking these medications        ibuprofen 200 MG tablet  Commonly known as:  ADVIL,MOTRIN      TAKE these medications        acetaminophen 325 MG tablet  Commonly known as:  TYLENOL  Take 2 tablets (650 mg total) by mouth every 4 (four) hours as needed for headache or mild pain.     apixaban 5 MG Tabs tablet  Commonly known as:  ELIQUIS  Take 1 tablet (5 mg total) by mouth 2 (two) times daily.     aspirin 81 MG chewable tablet  Chew 1 tablet (81 mg total) by mouth daily.  atorvastatin 40 MG tablet  Commonly known as:  LIPITOR  Take 1 tablet (40 mg total) by mouth daily.      carvedilol 6.25 MG tablet  Commonly known as:  COREG  Take 1 tablet (6.25 mg total) by mouth 2 (two) times daily with a meal.     glucose blood test strip  Commonly known as:  ONETOUCH VERIO  1 each by Other route 4 (four) times daily. And lances 4/day E11.9     Insulin Pen Needle 29G X 12.7MM Misc  Commonly known as:  BD ULTRA-FINE PEN NEEDLES  use as directed twice a day     nitroGLYCERIN 0.4 MG SL tablet  Commonly known as:  NITROSTAT  Place 1 tablet (0.4 mg total) under the tongue every 5 (five) minutes as needed for chest pain. Chest pain     NOVOLOG MIX 70/30 FLEXPEN (70-30) 100 UNIT/ML FlexPen  Generic drug:  insulin aspart protamine - aspart  Inject 12-30 Units into the skin 2 (two) times daily with a meal. Inject 12 units in the morning and 25-30 units in the evening depending on blood glucose reading     PROBIOTIC PO  Take 1 capsule by mouth every morning.     ramipril 5 MG capsule  Commonly known as:  ALTACE  Take 1 capsule (5 mg total) by mouth 2 (two) times daily.     spironolactone 25 MG tablet  Commonly known as:  ALDACTONE  Take 1 tablet (25 mg total) by mouth daily.     ticagrelor 90 MG Tabs tablet  Commonly known as:  BRILINTA  Take 1 tablet (90 mg total) by mouth 2 (two) times daily.          Diet and Activity recommendation: See Discharge Instructions above   Consults obtained - Neurology  Major procedures and Radiology Reports - PLEASE review detailed and final reports for all details, in brief -   TEE 01/26/2015   Dg Chest 2 View  01/17/2015  CLINICAL DATA:  CHF, recent coronary stent, acute shortness of breath EXAM: CHEST  2 VIEW COMPARISON:  01/14/2015 FINDINGS: mild cardiac enlargement without CHF. Small pleural effusions noted with slight increased bibasilar atelectasis compared 01/14/2015. No pneumothorax. Trachea midline. Atherosclerosis of the aorta. No acute osseous finding. IMPRESSION: Small bilateral pleural effusions with increased  bibasilar atelectasis. Borderline cardiomegaly without CHF. Electronically Signed   By: Judie Petit.  Shick M.D.   On: 01/17/2015 11:01   Ct Head Wo Contrast  01/24/2015  CLINICAL DATA:  Left visual loss with left-sided headaches EXAM: CT HEAD WITHOUT CONTRAST TECHNIQUE: Contiguous axial images were obtained from the base of the skull through the vertex without intravenous contrast. COMPARISON:  12/13/2006 FINDINGS: Bony calvarium is intact. Changes of prior ischemia are noted in the left parietal lobe posteriorly. No acute areas of ischemia are noted. No hemorrhage or space-occupying mass lesion is seen. IMPRESSION: Chronic changes without acute abnormality. Electronically Signed   By: Alcide Clever M.D.   On: 01/24/2015 21:48   Mr Maxine Glenn Head Wo Contrast  01/25/2015  CLINICAL DATA:  Temporary LEFT eye blindness concerning for transient ischemic attack. LEFT-sided headaches. EXAM: MRI HEAD WITHOUT CONTRAST MRA HEAD WITHOUT CONTRAST TECHNIQUE: Multiplanar, multiecho pulse sequences of the brain and surrounding structures were obtained without intravenous contrast. Angiographic images of the head were obtained using MRA technique without contrast. COMPARISON:  CT head February 01, 2015 MRI of the brain December 14, 2006 FINDINGS: MRI HEAD FINDINGS The ventricles and sulci are normal  for patient's age. No abnormal parenchymal signal, mass lesions, mass effect. 11 mm focus of faint reduced diffusion in RIGHT periatrial white matter, occipital lobe with low ADC value. Ventricles and sulci are normal for patient's age. Patchy supratentorial white matter T2 hyperintensities. No midline shift, mass effect or mass lesions. LEFT periatrial white matter T2 hyperintensity with low T1 signal. No susceptibility artifact to suggest hemorrhage. No abnormal extra-axial fluid collections. No extra-axial masses though, contrast enhanced sequences would be more sensitive. Normal major intracranial vascular flow voids seen at the skull base.  Ocular globes and orbital contents are unremarkable though not tailored for evaluation. No abnormal sellar expansion. No suspicious calvarial bone marrow signal. Craniocervical junction maintained. RIGHT sphenoid mucosal retention cyst without paranasal sinus air-fluid levels. MRA HEAD FINDINGS Anterior circulation: Normal flow related enhancement of the included cervical, petrous, cavernous and supraclinoid internal carotid arteries. Patent anterior communicating artery. Normal flow related enhancement of the anterior and middle cerebral arteries, including distal segments. Mild luminal irregularity of the middle cerebral arteries. No large vessel occlusion, high-grade stenosis, aneurysm. Posterior circulation: LEFT vertebral artery is dominant. Basilar artery is patent, with normal flow related enhancement of the main branch vessels. Robust LEFT, small RIGHT posterior communicating artery present. Loss of flow related enhancement in distal RIGHT posterior cerebral artery. No large vessel occlusion, high-grade stenosis, abnormal luminal irregularity, aneurysm. IMPRESSION: MRI HEAD: 11 mm focus of acute ischemia RIGHT occipital lobe. Old LEFT MCA territory infarcts and mild to moderate chronic small vessel ischemic disease. MRA HEAD: Distal RIGHT posterior cerebral artery occlusion versus artifact. Mild luminal irregularity of the middle cerebral arteries compatible with atherosclerosis. Electronically Signed   By: Awilda Metro M.D.   On: 01/25/2015 03:17   Mr Brain Wo Contrast  01/25/2015  CLINICAL DATA:  Temporary LEFT eye blindness concerning for transient ischemic attack. LEFT-sided headaches. EXAM: MRI HEAD WITHOUT CONTRAST MRA HEAD WITHOUT CONTRAST TECHNIQUE: Multiplanar, multiecho pulse sequences of the brain and surrounding structures were obtained without intravenous contrast. Angiographic images of the head were obtained using MRA technique without contrast. COMPARISON:  CT head February 01, 2015  MRI of the brain December 14, 2006 FINDINGS: MRI HEAD FINDINGS The ventricles and sulci are normal for patient's age. No abnormal parenchymal signal, mass lesions, mass effect. 11 mm focus of faint reduced diffusion in RIGHT periatrial white matter, occipital lobe with low ADC value. Ventricles and sulci are normal for patient's age. Patchy supratentorial white matter T2 hyperintensities. No midline shift, mass effect or mass lesions. LEFT periatrial white matter T2 hyperintensity with low T1 signal. No susceptibility artifact to suggest hemorrhage. No abnormal extra-axial fluid collections. No extra-axial masses though, contrast enhanced sequences would be more sensitive. Normal major intracranial vascular flow voids seen at the skull base. Ocular globes and orbital contents are unremarkable though not tailored for evaluation. No abnormal sellar expansion. No suspicious calvarial bone marrow signal. Craniocervical junction maintained. RIGHT sphenoid mucosal retention cyst without paranasal sinus air-fluid levels. MRA HEAD FINDINGS Anterior circulation: Normal flow related enhancement of the included cervical, petrous, cavernous and supraclinoid internal carotid arteries. Patent anterior communicating artery. Normal flow related enhancement of the anterior and middle cerebral arteries, including distal segments. Mild luminal irregularity of the middle cerebral arteries. No large vessel occlusion, high-grade stenosis, aneurysm. Posterior circulation: LEFT vertebral artery is dominant. Basilar artery is patent, with normal flow related enhancement of the main branch vessels. Robust LEFT, small RIGHT posterior communicating artery present. Loss of flow related enhancement in distal RIGHT posterior cerebral  artery. No large vessel occlusion, high-grade stenosis, abnormal luminal irregularity, aneurysm. IMPRESSION: MRI HEAD: 11 mm focus of acute ischemia RIGHT occipital lobe. Old LEFT MCA territory infarcts and mild to  moderate chronic small vessel ischemic disease. MRA HEAD: Distal RIGHT posterior cerebral artery occlusion versus artifact. Mild luminal irregularity of the middle cerebral arteries compatible with atherosclerosis. Electronically Signed   By: Awilda Metro M.D.   On: 01/25/2015 03:17   Dg Chest Portable 1 View  01/14/2015  CLINICAL DATA:  Chest pain. EXAM: PORTABLE CHEST 1 VIEW COMPARISON:  None. FINDINGS: Borderline cardiomegaly is noted. No pneumothorax or pleural effusion is noted. Right lung is clear. Minimal left basilar subsegmental atelectasis. The visualized skeletal structures are unremarkable. IMPRESSION: Minimal left basilar subsegmental atelectasis. Borderline cardiomegaly. Electronically Signed   By: Lupita Raider, M.D.   On: 01/14/2015 21:58    Micro Results     No results found for this or any previous visit (from the past 240 hour(s)).     Today   Subjective:   John Carrillo today has no headache,no chest abdominal pain,no new weakness tingling or numbness, feels much better wants to go home today.   Objective:   Blood pressure 122/75, pulse 62, temperature 97.6 F (36.4 C), temperature source Oral, resp. rate 16, height 5\' 9"  (1.753 m), weight 78.472 kg (173 lb), SpO2 100 %.   Intake/Output Summary (Last 24 hours) at 01/26/15 1257 Last data filed at 01/25/15 1823  Gross per 24 hour  Intake    480 ml  Output      0 ml  Net    480 ml    Exam Awake Alert, Oriented x 3, No new F.N deficits, Normal affect [place].AT,PERRAL Supple Neck,No JVD, No cervical lymphadenopathy appriciated.  Symmetrical Chest wall movement, Good air movement bilaterally, CTAB RRR,No Gallops,Rubs or new Murmurs, No Parasternal Heave +ve B.Sounds, Abd Soft, Non tender, No organomegaly appriciated, No rebound -guarding or rigidity. No Cyanosis, Clubbing or edema, No new Rash or bruise  Data Review   CBC w Diff: Lab Results  Component Value Date   WBC 7.2 01/24/2015   HGB 14.7  01/24/2015   HCT 44.1 01/24/2015   PLT 232 01/24/2015   LYMPHOPCT 29 01/24/2015   MONOPCT 12 01/24/2015   EOSPCT 6 01/24/2015   BASOPCT 1 01/24/2015    CMP: Lab Results  Component Value Date   NA 138 01/26/2015   K 3.8 01/26/2015   CL 104 01/26/2015   CO2 24 01/26/2015   BUN 18 01/26/2015   CREATININE 1.12 01/26/2015   CREATININE 1.20 11/26/2013   PROT 6.1* 01/24/2015   ALBUMIN 3.5 01/24/2015   BILITOT 0.8 01/24/2015   ALKPHOS 60 01/24/2015   AST 30 01/24/2015   ALT 38 01/24/2015  .   Total Time in preparing paper work, data evaluation and todays exam - 35 minutes  Azaria Bartell M.D on 01/26/2015 at 12:57 PM  [facility]   Office  [phone]

## 2015-01-26 NOTE — Care Management Note (Addendum)
Case Management Note  Patient Details  Name: John BolusStephen A Carrillo MRN: [id number] Date of Birth: March 23, 1950  Subjective/Objective:                    Action/Plan: Patient discharging home self care on Eliquis. [facility] with [employer] [facility]. Pt given the 30 day free Eliquis card and the $10 co-pay card. Bedside RN updated.   Expected Discharge Date:                  Expected Discharge Plan:    In-House Referral:     Discharge planning Services  CM Consult  Post Acute Care Choice:   Choice offered to:  DME Arranged:    DME Agency:     HH Arranged:   HH Agency:    Status of Service:  Completed, signed off  Medicare Important Message Given:    Date Medicare IM Given:    Medicare IM give by:    Date Additional Medicare IM Given:    Additional Medicare Important Message give by:     If discussed at Long Length of Stay Meetings, dates discussed:    Additional Comments:  Kermit BaloKelli F Katharin Schneider, RN 01/26/2015, 2:00 PM

## 2015-01-26 NOTE — Progress Notes (Signed)
SLP Cancellation Note  Patient Details Name: John Carrillo MRN: [id number] DOB: [birth year]   Cancelled treatment:       Reason Eval/Treat Not Completed: SLP screened, no needs identified, will sign off. Patient and family reporting no speech/cognitive/language deficits other than old language deficits as a result of left CVA 8 years ago. Declined full evaluation.   Ferdinand LangoLeah Marquest Gunkel MA, CCC-SLP [phone]   Ferdinand LangoMcCoy John Carrillo 01/26/2015, 12:16 PM

## 2015-01-26 NOTE — Progress Notes (Signed)
PT Cancellation Note  Patient Details Name: John Carrillo MRN: [id number] DOB: 22-Jan-1951   Cancelled Treatment:    Reason Eval/Treat Not Completed: PT screened, no needs identified, will sign off. Pt independent and at baseline.   [name] 01/26/2015, 12:36 PM [facility] Feick PT [phone]

## 2015-01-26 NOTE — Progress Notes (Signed)
I reviewed findings of TEE with patient. No LA thrombus. Severe LV dysfunction with thick spontaneous Echo contrast in the LV. This is a marker for increased risk of thrombus. At this point I would recommend anticoagulation with Eliquis. Would continue Brilinta for his recent stent. I would stop ASA now since this adds little to Brilinta. Follow up with Dr. [name] as outpatient.  John Ellis [place] MD, [facility]  01/26/2015 11:16 AM

## 2015-01-26 NOTE — H&P (View-Only) (Signed)
CARDIOLOGY CONSULT NOTE     Patient ID: John Carrillo MRN: [id number] DOB/AGE: [birth year] 64 y.o.  Admit date: 01/24/2015 Referring Physician Delia Heady MD Primary Physician Tana Conch, MD Primary Cardiologist Charlton Haws MD Reason for Consultation CVA  HPI: John Carrillo is a very pleasant 64 yo WM seen at the request of John Carrillo for evaluation of CVA. He has a history of CAD, prior CVA, HTN, HL and ischemic cardiomyopathy. EF 15%. Presented on 12/10 with SVT and chest pain. He underwent cardiac cath and had stenting of the mid to distal RCA with a DES. He is now on ASA and Brilinta. He was readmitted on 12/20 with sudden vision loss in his left eye.  CT showed no acute hemorrhage. MRI showed a new right occipital lobe CVA. There are old left MCA territory CVAs. Patient has no known history of atrial fibrillation. Was seen by Dr. [name] for EP evaluation for possible ICD on 12/13. Plan was to wait and recheck LV function 3 months after revascularization. He denies any current chest pain, SOB, or palpitations.   Past Medical History  Diagnosis Date  . CORONARY ARTERY DISEASE 08/11/2006  . CVA 12/19/2006    2008  . DIABETES MELLITUS, TYPE I 08/11/2006  . ECZEMA, HANDS 12/15/2009  . HYPERLIPIDEMIA 08/11/2006  . HYPERTENSION 08/11/2006  . MYOCARDIAL INFARCTION, HX OF 08/11/2006    07/14/01    Family History  Problem Relation Age of Onset  . Diabetes Neg Hx   . CAD Father     age 31  . CAD Paternal Grandfather     80s  . CAD Paternal Grandmother     38s  . Brain cancer Maternal Grandmother     Social History   Social History  . Marital Status: Married    Spouse Name: N/A  . Number of Children: N/A  . Years of Education: N/A   Occupational History  . Writer   Social History Main Topics  . Smoking status: Former Smoker -- 1.00 packs/day for 30 years    Types: Cigarettes    Quit date: 05/03/2000  . Smokeless tobacco: Not on file  . Alcohol  Use: 0.0 oz/week    0 Standard drinks or equivalent per week     Comment: 1-2/month  . Drug Use: No  . Sexual Activity: Not on file   Other Topics Concern  . Not on file   Social History Narrative   Married (kids are patients here, wife seen elsewhere). 3 step children, 1 biological. No grandkids.    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 Tonsilectomy, adenoidectomy, bilateral myringotomy and tubes    . Coronary stent placement    . Cardiac catheterization N/A 01/16/2015    Procedure: Left Heart Cath and Coronary Angiography;  Surgeon: [name], MD;  Location: [facility] INVASIVE CV LAB;  Service: Cardiovascular;  Laterality: N/A;     Prescriptions prior to admission  Medication Sig Dispense Refill Last Dose  . acetaminophen (TYLENOL) 325 MG tablet Take 2 tablets (650 mg total) by mouth every 4 (four) hours as needed for headache or mild pain.   01/24/2015 at Unknown time  .  aspirin 81 MG chewable tablet Chew 1 tablet (81 mg total) by mouth daily.   01/24/2015 at Unknown time  . atorvastatin (LIPITOR) 40 MG tablet Take 1 tablet (40 mg total) by mouth daily. 30 tablet 11 01/24/2015 at Unknown time  . carvedilol (COREG) 6.25 MG tablet Take 1 tablet (6.25 mg total) by mouth 2 (two) times daily with a meal. 60 tablet 11 01/24/2015 at 1800  . ibuprofen (ADVIL,MOTRIN) 200 MG tablet Take 400 mg by mouth every 6 (six) hours as needed for moderate pain.    01/24/2015 at Unknown time  . nitroGLYCERIN (NITROSTAT) 0.4 MG SL tablet Place 1 tablet (0.4 mg total) under the tongue every 5 (five) minutes as needed for chest pain. Chest pain 30 tablet 5 unknown  . NOVOLOG MIX 70/30 FLEXPEN (70-30) 100 UNIT/ML FlexPen Inject 12-30  Units into the skin 2 (two) times daily with a meal. Inject 12 units in the morning and 25-30 units in the evening depending on blood glucose reading  1 01/24/2015 at Unknown time  . Probiotic Product (PROBIOTIC PO) Take 1 capsule by mouth every morning.   01/24/2015 at Unknown time  . ramipril (ALTACE) 5 MG capsule Take 1 capsule (5 mg total) by mouth 2 (two) times daily. 60 capsule 11 01/24/2015 at Unknown time  . spironolactone (ALDACTONE) 25 MG tablet Take 1 tablet (25 mg total) by mouth daily. 30 tablet 11 01/24/2015 at Unknown time  . ticagrelor (BRILINTA) 90 MG TABS tablet Take 1 tablet (90 mg total) by mouth 2 (two) times daily. 60 tablet 11 01/24/2015 at Unknown time  . glucose blood (ONETOUCH VERIO) test strip 1 each by Other route 4 (four) times daily. And lances 4/day E11.9 120 each 3 Taking  . Insulin Pen Needle (BD ULTRA-FINE PEN NEEDLES) 29G X 12.7MM MISC use as directed twice a day 200 each 2 Taking    @  ROS: As noted in HPI. All other systems are reviewed and are negative unless otherwise mentioned.   Physical Exam: Blood pressure 97/59, pulse 54, temperature 97.8 F (36.6 C), temperature source Oral, resp. rate 16, weight 78.472 kg (173 lb), SpO2 99 %. Current Weight  01/25/15 78.472 kg (173 lb)  01/17/15 82.3 kg (181 lb 7 oz)  12/23/14 80.74 kg (178 lb)    GENERAL:  Well appearing WM in NAD HEENT:  PERRL, EOMI, sclera are clear. Oropharynx is clear. NECK:  No jugular venous distention, carotid upstroke brisk and symmetric, no bruits, no thyromegaly or adenopathy LUNGS:  Clear to auscultation bilaterally CHEST:  Unremarkable HEART:  RRR,  PMI not displaced or sustained,S1 and S2 within normal limits, no S3, no S4: no clicks, no rubs, no murmurs ABD:  Soft, nontender. BS +, no masses or bruits. No hepatomegaly, no splenomegaly EXT:  2 + pulses throughout, no edema, no cyanosis no clubbing SKIN:  Warm and dry.  No rashes NEURO:  Alert and oriented x 3. Cranial  nerves II through XII intact. PSYCH:  Cognitively intact    Labs:   Lab Results  Component Value Date   WBC 7.2 01/24/2015   HGB 14.7 01/24/2015   HCT 44.1 01/24/2015   MCV 98.4 01/24/2015   PLT 232 01/24/2015    Recent Labs Lab 01/24/15 2118 01/25/15 0626  NA 135 139  K 5.5* 4.2  CL 99* 106  CO2 28 27  BUN 20 23*  CREATININE 1.86* 1.41*  CALCIUM 9.4 8.6*  PROT 6.1*  --   BILITOT 0.8  --  ALKPHOS 60  --   ALT 38  --   AST 30  --   GLUCOSE 551* 207*   Lab Results  Component Value Date   CKTOTAL 192 12/14/2006   CKTOTAL 190 12/14/2006   CKTOTAL 233* 12/14/2006   CKMB 2.0 12/14/2006   CKMB 2.1 12/14/2006   CKMB 2.2 12/14/2006   TROPONINI 0.13* 01/15/2015   TROPONINI 0.19* 01/15/2015   TROPONINI 0.20* 01/15/2015   Lab Results  Component Value Date   CHOL 170 01/25/2015   CHOL 252* 06/24/2014   CHOL 172 11/26/2013   Lab Results  Component Value Date   HDL 55 01/25/2015   HDL 61.70 06/24/2014   HDL 58 11/26/2013   Lab Results  Component Value Date   LDLCALC 102* 01/25/2015   LDLCALC 172* 06/24/2014   LDLCALC 102* 11/26/2013   Lab Results  Component Value Date   TRIG 63 01/25/2015   TRIG 94.0 06/24/2014   TRIG 60 11/26/2013   Lab Results  Component Value Date   CHOLHDL 3.1 01/25/2015   CHOLHDL 4 06/24/2014   CHOLHDL 3.0 11/26/2013   Lab Results  Component Value Date   LDLDIRECT 100.0 12/23/2014   LDLDIRECT 133.0 12/01/2009    No results found for: PROBNP Lab Results  Component Value Date   TSH 7.31* 12/23/2014   Lab Results  Component Value Date   HGBA1C 9.3* 01/15/2015    Radiology: Ct Head Wo Contrast  01/24/2015  CLINICAL DATA:  Left visual loss with left-sided headaches EXAM: CT HEAD WITHOUT CONTRAST TECHNIQUE: Contiguous axial images were obtained from the base of the skull through the vertex without intravenous contrast. COMPARISON:  12/13/2006 FINDINGS: Bony calvarium is intact. Changes of prior ischemia are noted in the  left parietal lobe posteriorly. No acute areas of ischemia are noted. No hemorrhage or space-occupying mass lesion is seen. IMPRESSION: Chronic changes without acute abnormality. Electronically Signed   By: Alcide CleverMark  Lukens M.D.   On: 01/24/2015 21:48   Mr Maxine GlennMra Head Wo Contrast  01/25/2015  CLINICAL DATA:  Temporary LEFT eye blindness concerning for transient ischemic attack. LEFT-sided headaches. EXAM: MRI HEAD WITHOUT CONTRAST MRA HEAD WITHOUT CONTRAST TECHNIQUE: Multiplanar, multiecho pulse sequences of the brain and surrounding structures were obtained without intravenous contrast. Angiographic images of the head were obtained using MRA technique without contrast. COMPARISON:  CT head February 01, 2015 MRI of the brain December 14, 2006 FINDINGS: MRI HEAD FINDINGS The ventricles and sulci are normal for patient's age. No abnormal parenchymal signal, mass lesions, mass effect. 11 mm focus of faint reduced diffusion in RIGHT periatrial white matter, occipital lobe with low ADC value. Ventricles and sulci are normal for patient's age. Patchy supratentorial white matter T2 hyperintensities. No midline shift, mass effect or mass lesions. LEFT periatrial white matter T2 hyperintensity with low T1 signal. No susceptibility artifact to suggest hemorrhage. No abnormal extra-axial fluid collections. No extra-axial masses though, contrast enhanced sequences would be more sensitive. Normal major intracranial vascular flow voids seen at the skull base. Ocular globes and orbital contents are unremarkable though not tailored for evaluation. No abnormal sellar expansion. No suspicious calvarial bone marrow signal. Craniocervical junction maintained. RIGHT sphenoid mucosal retention cyst without paranasal sinus air-fluid levels. MRA HEAD FINDINGS Anterior circulation: Normal flow related enhancement of the included cervical, petrous, cavernous and supraclinoid internal carotid arteries. Patent anterior communicating artery. Normal  flow related enhancement of the anterior and middle cerebral arteries, including distal segments. Mild luminal irregularity of the middle cerebral arteries. No  large vessel occlusion, high-grade stenosis, aneurysm. Posterior circulation: LEFT vertebral artery is dominant. Basilar artery is patent, with normal flow related enhancement of the main branch vessels. Robust LEFT, small RIGHT posterior communicating artery present. Loss of flow related enhancement in distal RIGHT posterior cerebral artery. No large vessel occlusion, high-grade stenosis, abnormal luminal irregularity, aneurysm. IMPRESSION: MRI HEAD: 11 mm focus of acute ischemia RIGHT occipital lobe. Old LEFT MCA territory infarcts and mild to moderate chronic small vessel ischemic disease. MRA HEAD: Distal RIGHT posterior cerebral artery occlusion versus artifact. Mild luminal irregularity of the middle cerebral arteries compatible with atherosclerosis. Electronically Signed   By: Awilda Metro M.D.   On: 01/25/2015 03:17   Mr Brain Wo Contrast  01/25/2015  CLINICAL DATA:  Temporary LEFT eye blindness concerning for transient ischemic attack. LEFT-sided headaches. EXAM: MRI HEAD WITHOUT CONTRAST MRA HEAD WITHOUT CONTRAST TECHNIQUE: Multiplanar, multiecho pulse sequences of the brain and surrounding structures were obtained without intravenous contrast. Angiographic images of the head were obtained using MRA technique without contrast. COMPARISON:  CT head February 01, 2015 MRI of the brain December 14, 2006 FINDINGS: MRI HEAD FINDINGS The ventricles and sulci are normal for patient's age. No abnormal parenchymal signal, mass lesions, mass effect. 11 mm focus of faint reduced diffusion in RIGHT periatrial white matter, occipital lobe with low ADC value. Ventricles and sulci are normal for patient's age. Patchy supratentorial white matter T2 hyperintensities. No midline shift, mass effect or mass lesions. LEFT periatrial white matter T2 hyperintensity  with low T1 signal. No susceptibility artifact to suggest hemorrhage. No abnormal extra-axial fluid collections. No extra-axial masses though, contrast enhanced sequences would be more sensitive. Normal major intracranial vascular flow voids seen at the skull base. Ocular globes and orbital contents are unremarkable though not tailored for evaluation. No abnormal sellar expansion. No suspicious calvarial bone marrow signal. Craniocervical junction maintained. RIGHT sphenoid mucosal retention cyst without paranasal sinus air-fluid levels. MRA HEAD FINDINGS Anterior circulation: Normal flow related enhancement of the included cervical, petrous, cavernous and supraclinoid internal carotid arteries. Patent anterior communicating artery. Normal flow related enhancement of the anterior and middle cerebral arteries, including distal segments. Mild luminal irregularity of the middle cerebral arteries. No large vessel occlusion, high-grade stenosis, aneurysm. Posterior circulation: LEFT vertebral artery is dominant. Basilar artery is patent, with normal flow related enhancement of the main branch vessels. Robust LEFT, small RIGHT posterior communicating artery present. Loss of flow related enhancement in distal RIGHT posterior cerebral artery. No large vessel occlusion, high-grade stenosis, abnormal luminal irregularity, aneurysm. IMPRESSION: MRI HEAD: 11 mm focus of acute ischemia RIGHT occipital lobe. Old LEFT MCA territory infarcts and mild to moderate chronic small vessel ischemic disease. MRA HEAD: Distal RIGHT posterior cerebral artery occlusion versus artifact. Mild luminal irregularity of the middle cerebral arteries compatible with atherosclerosis. Electronically Signed   By: Awilda Metro M.D.   On: 01/25/2015 03:17    EKG: NSR with LAE and LBBB. I have personally reviewed and interpreted this study.   ASSESSMENT AND PLAN:  1. Recurrent CVA- possibly embolic. Potential sources include LV thrombus with  low EF or LA thrombus with intermittent Afib. No LV thrombus by Echo 2 weeks ago. No documented Afib. Normally would consider switching from antiplatelet to anticoagulant therapy but patient requires antiplatelet therapy with recent DES. Before commiting him to triple therapy with significant increased risk of bleeding would like to document cardiac thrombus or Afib. Discussed with Dr. Pearlean Brownie. Will proceed with TEE tomorrow. If thrombus is noted will  start anticoagulation. If not then will proceed with ILR to try and document Afib. If anticoagulation indicated would continue Brilinta and add Eliquis. Would stop ASA at one month post PCI. TEE procedure reviewed with patient in detail and will schedule for am. 2. Ischemic CM with chronic systolic CHF. Well compensated. On aldactone, Coreg, and Altace 3. CAD s/p recent DES RCA. Prior stenting of LAD 2001 4. HTN 5. Hyperlipidemia on lipitor. 6. DM on insulin.   Signed: Peter [place], MDFACC  01/25/2015, 5:10 PM

## 2015-01-27 ENCOUNTER — Encounter (HOSPITAL_COMMUNITY): Payer: Self-pay | Admitting: Cardiovascular Disease

## 2015-01-27 LAB — GLUCOSE, CAPILLARY
GLUCOSE-CAPILLARY: 456 mg/dL — AB (ref 65–99)
Glucose-Capillary: 173 mg/dL — ABNORMAL HIGH (ref 65–99)

## 2015-01-30 NOTE — Progress Notes (Addendum)
Cardiology Office Note    Date:  01/31/2015   ID:  John Carrillo, DOB 04-06-1950, MRN [id number]  PCP:  Tana Conch, MD  Cardiologist:  Dr. Charlton Haws   Electrophysiologist:  Dr. Hillis Range   Chief Complaint  Patient presents with  . Hospitalization Follow-up    1. NSTEMI >> PCI;  2. s/p CVA  . Coronary Artery Disease  . Cardiomyopathy    History of Present Illness:  John Carrillo is a 64 y.o. male with a hx of CAD status post anterior MI 15 years ago treated with stenting of the LAD, ICM, LBBB, prior CVA, HL, DM 2. Previously followed by Dr. [name]. He was evaluated by Dr. [name] in 2008 for ICD implantation for primary prevention.  He declined at that time.    He was admitted 12/10-12/13 with a non-STEMI. He presented to the emergency room with SVT with baseline LBBB. He converted to NSR with adenosine. LHC demonstrated patent LAD stent and high-grade RCA disease. This was treated with a DES. EF was severely depressed. By echocardiogram, ejection fraction was 15%. He was seen by Dr. [name] for EP evaluation. LifeVest was not recommended. Optimization of medical therapy was recommended over the next 3 months and then reevaluation of LV function.  Readmitted 12/20-12/22 with sudden vision loss in the left eye. MRI confirmed new right occipital lobe CVA. There was also old left MCA territory CVAs. He was seen by cardiology and EP. He declined ILR implantation. TEE demonstrated severe LV dysfunction with thick spontaneous echo contrast in LV. This is a marker for increased risk of thrombus. Anticoagulation was recommended. He was placed on Eliquis. Brilinta was continued. It was recommended that ASA could be stopped as it adds little to Brilinta.    Returns for follow-up. Since DC, he has been doing well. Denies recurrent chest symptoms. Denies significant dyspnea. Denies orthopnea, PND or edema. Denies syncope or near-syncope. Denies fatigue or weakness. He denies any  bleeding issues.   Past Medical History  Diagnosis Date  . CORONARY ARTERY DISEASE 08/11/2006  . CVA 12/19/2006    2008  . DIABETES MELLITUS, TYPE I 08/11/2006  . ECZEMA, HANDS 12/15/2009  . HYPERLIPIDEMIA 08/11/2006  . HYPERTENSION 08/11/2006  . MYOCARDIAL INFARCTION, HX OF 08/11/2006    07/14/01    Past Surgical History  Procedure Laterality Date  . Tonsilectomy, adenoidectomy, bilateral myringotomy and tubes    . Coronary stent placement    . Cardiac catheterization N/A 01/16/2015    Procedure: Left Heart Cath and Coronary Angiography;  Surgeon: [name], MD;  Location: [facility] INVASIVE CV LAB;  Service: Cardiovascular;  Laterality: N/A;  . Tee without cardioversion N/A 01/26/2015    Procedure: TRANSESOPHAGEAL ECHOCARDIOGRAM (TEE);  Surgeon: [name], MD;  Location: [facility] ENDOSCOPY;  Service: Cardiovascular;  Laterality: N/A;    Current Outpatient Prescriptions  Medication Sig Dispense Refill  . acetaminophen (TYLENOL) 325 MG tablet Take 2 tablets (650 mg total) by mouth every 4 (four) hours as needed for headache or mild pain.    [name] apixaban (ELIQUIS) 5 MG TABS tablet Take 1 tablet (5 mg total) by mouth 2 (two) times daily. 60 tablet 11  . atorvastatin (LIPITOR) 40 MG tablet Take 1 tablet (40 mg total) by mouth daily. 30 tablet 11  . carvedilol (COREG) 6.25 MG tablet Take 1 tablet (6.25 mg total) by mouth 2 (two) times daily with a meal. 60 tablet 11  . glucose blood (ONETOUCH VERIO) test strip 1  each by Other route 4 (four) times daily. And lances 4/day E11.9 120 each 3  . Insulin Pen Needle (BD ULTRA-FINE PEN NEEDLES) 29G X 12.7MM MISC use as directed twice a day 200 each 2  . nitroGLYCERIN (NITROSTAT) 0.4 MG SL tablet Place 1 tablet (0.4 mg total) under the tongue every 5 (five) minutes as needed for chest pain. Chest pain 30 tablet 5  . NOVOLOG MIX 70/30 FLEXPEN (70-30) 100 UNIT/ML FlexPen Inject 12-30 Units into the skin 2 (two) times daily with a meal. Inject 12 units in the  morning and 25-30 units in the evening depending on blood glucose reading  1  . Probiotic Product (PROBIOTIC PO) Take 1 capsule by mouth every morning.    . ramipril (ALTACE) 5 MG capsule Take 1 capsule (5 mg total) by mouth daily. 60 capsule 11  . spironolactone (ALDACTONE) 25 MG tablet Take 1 tablet (25 mg total) by mouth daily. 30 tablet 11  . ticagrelor (BRILINTA) 90 MG TABS tablet Take 1 tablet (90 mg total) by mouth 2 (two) times daily. 60 tablet 11   No current facility-administered medications for this visit.    Allergies:   Review of patient's allergies indicates no known allergies.   Social History   Social History  . Marital Status: Married    Spouse Name: N/A  . Number of Children: N/A  . Years of Education: N/A   Occupational History  . Writerroduction Manager [employer]   Social History Main Topics  . Smoking status: Former Smoker -- 1.00 packs/day for 30 years    Types: Cigarettes    Quit date: 05/03/2000  . Smokeless tobacco: None  . Alcohol Use: 0.0 oz/week    0 Standard drinks or equivalent per week     Comment: 1-2/month  . Drug Use: No  . Sexual Activity: Not Asked   Other Topics Concern  . None   Social History Narrative   Married (kids are patients here, wife seen elsewhere). 3 step children, 1 biological. No grandkids.    In late 2016:   89 yo sophomore at [employer]- consider age 89 retirement   Oldest daughter - [employer]- social work intl business minor religion      Emergency planning/management officerroject manager for [employer]      Hobbies: race sail boats, adrenaline related activities     Family History:  The patient's family history includes Brain cancer in his maternal grandmother; CAD in his father, paternal grandfather, and paternal grandmother. There is no history of Diabetes.   ROS:   Please see the history of present illness.    Review of Systems  Cardiovascular: Positive for chest pain.  Respiratory: Positive for shortness of breath.   All  other systems reviewed and are negative.   PHYSICAL EXAM:   VS:  BP 90/58 mmHg  Pulse 55  Ht 5\' 8"  (1.727 m)  Wt 172 lb (78.019 kg)  BMI 26.16 kg/m2  SpO2 96%   GEN: Well nourished, well developed, in no acute distress HEENT: normal Neck: no JVD, no masses Cardiac: Normal S1/S2,  RRR; no murmurs, rubs, or gallops, no edema;  right wrist without hematoma or mass    Respiratory:  clear to auscultation bilaterally; no wheezing, rhonchi or rales GI: soft, nontender, nondistended, + BS MS: no deformity or atrophy Skin: warm and dry, no rash Neuro:  Bilateral strength equal, no focal deficits  Psych: Alert and oriented x 3, normal affect  Wt Readings from Last 3 Encounters:  01/31/15 172 lb (78.019 kg)  01/25/15 173 lb (78.472 kg)  01/17/15 181 lb 7 oz (82.3 kg)      Studies/Labs Reviewed:   EKG:  EKG is  ordered today.  The ekg ordered today demonstrates sinus bradycardia, HR 55, LBBB, first-degree AV block (PR 212 ms), no significant change when compared to prior tracings  Recent Labs: 12/23/2014: TSH 7.31* 01/24/2015: ALT 38; Hemoglobin 14.7; Platelets 232 01/26/2015: BUN 18; Creatinine, Ser 1.12; Potassium 3.8; Sodium 138   Recent Lipid Panel    Component Value Date/Time   CHOL 170 01/25/2015 0626   TRIG 63 01/25/2015 0626   HDL 55 01/25/2015 0626   CHOLHDL 3.1 01/25/2015 0626   VLDL 13 01/25/2015 0626   LDLCALC 102* 01/25/2015 0626   LDLDIRECT 100.0 12/23/2014 1038    Additional studies/ records that were reviewed today include:   TEE 01/26/15 EF 20-25%, anteroseptal/anterior/apical akinesis, grade 2 diastolic dysfunction, moderate apical spontaneous echo contrast indicative of stasis, mild MR, mild LAE with mild intermittent spontaneous echo contrast in the LAA  [facility] 01/16/15 LAD proximal stent patent RCA mid 95% EF 25% PCI: 3.5 x 20 mm Promus premier DES to the mid RCA     Echo 01/16/15 Septal and apical akinesis, diffuse HK, EF 15%, mild MR, mild LAE,  mild RAE, PASP 59 mmHg, moderate TR   ASSESSMENT:    1. CAD S/P LAD PCI'03, RCA DES 01/16/15   2. Cardiomyopathy, ischemic   3. Chronic systolic CHF (congestive heart failure) (HCC)   4. Cerebrovascular accident (CVA) due to embolism of cerebral artery (HCC)   5. Essential hypertension   6. Dyslipidemia   7. SVT (supraventricular tachycardia) (HCC)   8. CKD (chronic kidney disease), stage II     PLAN:  In order of problems listed above:  1. CAD - s/p recent non-STEMI treated with a DES to the RCA. He is no longer on aspirin as he is on Eliquis and Brilinta. We discussed the importance of remaining on Brilinta for 12 months. Continue Brilinta, statin, beta blocker, ACE inhibitor. Refer to cardiac rehabilitation.  2. Ischemic CM - Continue beta blocker, ACE inhibitor, spironolactone. Blood pressure is running low. I will decrease his Ramipril to 5 mg daily. Check follow-up BMET today. Plan follow-up echocardiogram 90 days post PCI. If EF remains <35%, refer back to Dr. [name] for consideration of ICD.  3. Chronic Systolic HF - Volume stable. He is NYHA 2. Continue current therapy.  4. S/p Embolic CVA - As noted, TEE with thick spontaneous echo contrast in LV which is a high risk marker for thromboembolism.  He is now on Eliquis. Repeat echo 90 days.  Obtain follow-up BMET, CBC in 6 weeks.  5. HTN - Blood pressure running low. He is asymptomatic. As noted, I will decrease his ramipril to 5 mg daily.  6. HL -  Continue statin. Obtain follow-up lipids and LFTs in 6 weeks.  7. SVT - Continue beta blocker.  8. CKD - Repeat BMET today.  Creatinine prior to DC was improved.     Medication Adjustments/Labs and Tests Ordered: Current medicines are reviewed at length with the patient today.  Concerns regarding medicines are outlined above.  Medication changes, Labs and Tests ordered today are listed below. Patient Instructions  Medication Instructions:  Your physician has recommended you  make the following change in your medication:  I. Decrease Altace ( 5 mg ) daily 2. eliquis was sent in today for 1 year of refills  Labwork: Your physician recommends that you lab work today: bmet Your physician recommends that you return for a FASTING lipid profile: bmet/cbc/lft/lipid   Testing/Procedures: Your physician has requested that you have an echocardiogram. Echocardiography is a painless test that uses sound waves to create images of your heart. It provides your doctor with information about the size and shape of your heart and how well your heart's chambers and valves are working. This procedure takes approximately one hour. There are no restrictions for this procedure.    Follow-Up: Your physician recommends that you keep your scheduled  follow-up appointment with Dr. [name]   Any Other Special Instructions Will Be Listed Below (If Applicable).   A referral was placed today for Cardiac Rehab at [facility], some one will be contacting you to start rehab.  If you need a refill on your cardiac medications before your next appointment, please call your pharmacy.     Signed, Tereso Newcomer, PA-C  01/31/2015 12:34 PM    [facility] [address] [place], [place], [place]  [address] Phone: [phone]; Fax: [phone]

## 2015-01-31 ENCOUNTER — Encounter: Payer: Self-pay | Admitting: Physician Assistant

## 2015-01-31 ENCOUNTER — Telehealth: Payer: Self-pay | Admitting: Family Medicine

## 2015-01-31 ENCOUNTER — Ambulatory Visit (INDEPENDENT_AMBULATORY_CARE_PROVIDER_SITE_OTHER): Payer: 59 | Admitting: Physician Assistant

## 2015-01-31 VITALS — BP 90/58 | HR 55 | Ht 68.0 in | Wt 172.0 lb

## 2015-01-31 DIAGNOSIS — I10 Essential (primary) hypertension: Secondary | ICD-10-CM

## 2015-01-31 DIAGNOSIS — I6340 Cerebral infarction due to embolism of unspecified cerebral artery: Secondary | ICD-10-CM | POA: Diagnosis not present

## 2015-01-31 DIAGNOSIS — I471 Supraventricular tachycardia: Secondary | ICD-10-CM

## 2015-01-31 DIAGNOSIS — I255 Ischemic cardiomyopathy: Secondary | ICD-10-CM | POA: Diagnosis not present

## 2015-01-31 DIAGNOSIS — I2510 Atherosclerotic heart disease of native coronary artery without angina pectoris: Secondary | ICD-10-CM | POA: Diagnosis not present

## 2015-01-31 DIAGNOSIS — Z9861 Coronary angioplasty status: Secondary | ICD-10-CM

## 2015-01-31 DIAGNOSIS — N182 Chronic kidney disease, stage 2 (mild): Secondary | ICD-10-CM

## 2015-01-31 DIAGNOSIS — I5022 Chronic systolic (congestive) heart failure: Secondary | ICD-10-CM

## 2015-01-31 DIAGNOSIS — E785 Hyperlipidemia, unspecified: Secondary | ICD-10-CM

## 2015-01-31 LAB — BASIC METABOLIC PANEL WITH GFR
BUN: 20 mg/dL (ref 7–25)
CO2: 27 mmol/L (ref 20–31)
Calcium: 9.2 mg/dL (ref 8.6–10.3)
Chloride: 99 mmol/L (ref 98–110)
Creat: 1.24 mg/dL (ref 0.70–1.25)
Glucose, Bld: 231 mg/dL — ABNORMAL HIGH (ref 65–99)
Potassium: 4.4 mmol/L (ref 3.5–5.3)
Sodium: 134 mmol/L — ABNORMAL LOW (ref 135–146)

## 2015-01-31 MED ORDER — APIXABAN 5 MG PO TABS: 5.0000 mg | ORAL_TABLET | Freq: Two times a day (BID) | ORAL | Status: DC

## 2015-01-31 MED ORDER — RAMIPRIL 5 MG PO CAPS: 5.0000 mg | ORAL_CAPSULE | Freq: Every day | ORAL | Status: DC

## 2015-01-31 NOTE — Telephone Encounter (Signed)
Patient called to schedule hospital follow up. Is it ok to use a SDA?

## 2015-01-31 NOTE — Patient Instructions (Signed)
Medication Instructions:  Your physician has recommended you make the following change in your medication:  I. Decrease Altace ( 5 mg ) daily 2. eliquis was sent in today for 1 year of refills   Labwork: Your physician recommends that you lab work today: bmet Your physician recommends that you return for a FASTING lipid profile: bmet/cbc/lft/lipid   Testing/Procedures: Your physician has requested that you have an echocardiogram. Echocardiography is a painless test that uses sound waves to create images of your heart. It provides your doctor with information about the size and shape of your heart and how well your heart's chambers and valves are working. This procedure takes approximately one hour. There are no restrictions for this procedure.    Follow-Up: Your physician recommends that you keep your scheduled  follow-up appointment with Dr. [name]   Any Other Special Instructions Will Be Listed Below (If Applicable).   A referral was placed today for Cardiac Rehab at [facility], some one will be contacting you to start rehab.  If you need a refill on your cardiac medications before your next appointment, please call your pharmacy.

## 2015-01-31 NOTE — Telephone Encounter (Signed)
See below

## 2015-02-01 ENCOUNTER — Telehealth: Payer: Self-pay | Admitting: *Deleted

## 2015-02-01 NOTE — Telephone Encounter (Signed)
Use an 11:30 SDA if possible please

## 2015-02-01 NOTE — Telephone Encounter (Signed)
Lmptcb for lab results 

## 2015-02-01 NOTE — Telephone Encounter (Signed)
Erica see below. 

## 2015-02-02 NOTE — Telephone Encounter (Signed)
Pt notiified of lab results by phone with verbal understanding.

## 2015-02-02 NOTE — Telephone Encounter (Signed)
F/u  Pt returning rn phone call- lab work. Please call back and discuss.

## 2015-02-08 ENCOUNTER — Encounter: Payer: Self-pay | Admitting: Family Medicine

## 2015-02-08 ENCOUNTER — Ambulatory Visit (INDEPENDENT_AMBULATORY_CARE_PROVIDER_SITE_OTHER): Payer: 59 | Admitting: Family Medicine

## 2015-02-08 VITALS — BP 122/70 | HR 60 | Temp 97.8°F | Wt 177.0 lb

## 2015-02-08 DIAGNOSIS — N183 Chronic kidney disease, stage 3 unspecified: Secondary | ICD-10-CM

## 2015-02-08 DIAGNOSIS — I5022 Chronic systolic (congestive) heart failure: Secondary | ICD-10-CM | POA: Diagnosis not present

## 2015-02-08 DIAGNOSIS — Z9861 Coronary angioplasty status: Secondary | ICD-10-CM

## 2015-02-08 DIAGNOSIS — E1122 Type 2 diabetes mellitus with diabetic chronic kidney disease: Secondary | ICD-10-CM

## 2015-02-08 DIAGNOSIS — I2510 Atherosclerotic heart disease of native coronary artery without angina pectoris: Secondary | ICD-10-CM

## 2015-02-08 DIAGNOSIS — Z794 Long term (current) use of insulin: Secondary | ICD-10-CM

## 2015-02-08 DIAGNOSIS — I6310 Cerebral infarction due to embolism of unspecified precerebral artery: Secondary | ICD-10-CM | POA: Diagnosis not present

## 2015-02-08 NOTE — Assessment & Plan Note (Signed)
S: GFR generally just below 60 but also above this at times.  A/P: had AKI in hospital after CVA which resolved by time of discharge. Baseline creatinine in 1.1-1.4 range. On ace-i in case proteinuric. Also BP and lipids controlled. Already avoids nsaids

## 2015-02-08 NOTE — Assessment & Plan Note (Signed)
S: after NSTEMI EF dropped to 15%. Is on ace i ramipril 5mg  as well as spironolactone and coreg.  A/P: plan is for 3 month repeat and if still depressed consider life vest and defib. NYHA II and hopeful that patient's EF improves significantly on current medical therapy.

## 2015-02-08 NOTE — Assessment & Plan Note (Addendum)
S: a1c has slowly risen recently and now uncontrolled A/P:asked patient to return to Dr. Everardo AllEllison as needs tighter glycemic control given CVA and MI recently. CKD III will also benefit from tighter control.

## 2015-02-08 NOTE — Progress Notes (Signed)
Tana Conch, MD  Subjective:  John Carrillo is a 65 y.o. year old very pleasant male patient who presents for/with See problem oriented charting ROS- No chest pain or shortness of breath. No headache or blurry vision.   Past Medical History-  Patient Active Problem List   Diagnosis Date Noted  . Chronic systolic CHF (congestive heart failure) (HCC) 01/25/2015    Priority: High  . Cardiomyopathy, ischemic 01/17/2015    Priority: High  . Type 2 diabetes mellitus with renal manifestations, controlled (HCC) 06/14/2011    Priority: High  . CVA (cerebral infarction) 12/19/2006    Priority: High  . CAD S/P LAD PCI'03, RCA DES 01/16/15 08/11/2006    Priority: High  . CKD (chronic kidney disease), stage III 01/17/2015    Priority: Medium  . Dyslipidemia 08/11/2006    Priority: Medium  . Essential hypertension 08/11/2006    Priority: Medium  . LBBB (left bundle branch block) 01/17/2015    Priority: Low  . SVT (supraventricular tachycardia) (HCC) 01/14/2015    Priority: Low  . Former smoker 06/24/2014    Priority: Low  . Eczema of both hands 12/15/2009    Priority: Low    Medications- reviewed and updated Current Outpatient Prescriptions  Medication Sig Dispense Refill  . apixaban (ELIQUIS) 5 MG TABS tablet Take 1 tablet (5 mg total) by mouth 2 (two) times daily. 60 tablet 11  . atorvastatin (LIPITOR) 40 MG tablet Take 1 tablet (40 mg total) by mouth daily. 30 tablet 11  . carvedilol (COREG) 6.25 MG tablet Take 1 tablet (6.25 mg total) by mouth 2 (two) times daily with a meal. 60 tablet 11  . glucose blood (ONETOUCH VERIO) test strip 1 each by Other route 4 (four) times daily. And lances 4/day E11.9 120 each 3  . Insulin Pen Needle (BD ULTRA-FINE PEN NEEDLES) 29G X 12.7MM MISC use as directed twice a day 200 each 2  . NOVOLOG MIX 70/30 FLEXPEN (70-30) 100 UNIT/ML FlexPen Inject 12-30 Units into the skin 2 (two) times daily with a meal. Inject 12 units in the morning and 25-30  units in the evening depending on blood glucose reading  1  . Probiotic Product (PROBIOTIC PO) Take 1 capsule by mouth every morning.    . ramipril (ALTACE) 5 MG capsule Take 1 capsule (5 mg total) by mouth daily. 60 capsule 11  . spironolactone (ALDACTONE) 25 MG tablet Take 1 tablet (25 mg total) by mouth daily. 30 tablet 11  . ticagrelor (BRILINTA) 90 MG TABS tablet Take 1 tablet (90 mg total) by mouth 2 (two) times daily. 60 tablet 11  . acetaminophen (TYLENOL) 325 MG tablet Take 2 tablets (650 mg total) by mouth every 4 (four) hours as needed for headache or mild pain. (Patient not taking: Reported on 02/08/2015)    . nitroGLYCERIN (NITROSTAT) 0.4 MG SL tablet Place 1 tablet (0.4 mg total) under the tongue every 5 (five) minutes as needed for chest pain. Chest pain (Patient not taking: Reported on 02/08/2015) 30 tablet 5   Objective: BP 122/70 mmHg  Pulse 60  Temp(Src) 97.8 F (36.6 C)  Wt 177 lb (80.287 kg) Gen: NAD, resting comfortably CV: RRR no murmurs rubs or gallops Lungs: CTAB no crackles, wheeze, rhonchi Abdomen: soft/nontender/nondistended/normal bowel sounds. No rebound or guarding.  Ext: no edema Skin: warm, dry Neuro: grossly normal, moves all extremities  Assessment/Plan:  CVA (cerebral infarction) S:01/2016 2nd CVA- left eye blindness resolved before discharge. Started on eliquis as thought emoblic  from low EF and findings on TEE. brilinta was also continued with recent NSTEMI and PTCA in RCA.  A/P: Patient has neuro follow up in February. Given embolic source likely- continue eliquis. Discharge summary mentions using aspirin for 30 days but last cards note before discharge states eliquis and brilinta only.    CAD S/P LAD PCI'03, RCA DES 01/16/15 Recent NSTEMI  S: patient asymptomatic at last visit but we referred to cardiology to have him plugged backed in. Unfortunately developed an NSTEMI 01/14/15 and went to ED and ultimately had RCA DES 01/16/15 and is now on  Brilinta. Has been continued on statin while aspirin stopped as now on eliquis. NSTEMI predated CVA by about 7-10 days and apparently had prior CVA after catheterization.  A/P:continue cardiology follow up. Asymptomatic today again.    CKD (chronic kidney disease), stage III S: GFR generally just below 60 but also above this at times.  A/P: had AKI in hospital after CVA which resolved by time of discharge. Baseline creatinine in 1.1-1.4 range. On ace-i in case proteinuric. Also BP and lipids controlled. Already avoids nsaids   Chronic systolic CHF (congestive heart failure) (HCC) S: after NSTEMI EF dropped to 15%. Is on ace i ramipril 5mg  as well as spironolactone and coreg.  A/P: plan is for 3 month repeat and if still depressed consider life vest and defib. NYHA II and hopeful that patient's EF improves significantly on current medical therapy.    Type 2 diabetes mellitus with renal manifestations, controlled (HCC) S: a1c has slowly risen recently and now uncontrolled A/P:asked patient to return to Dr. Everardo AllEllison as needs tighter glycemic control given CVA and MI recently. CKD III will also benefit from tighter control.    6 months or sooner prn

## 2015-02-08 NOTE — Assessment & Plan Note (Signed)
S:01/2016 2nd CVA- left eye blindness resolved before discharge. Started on eliquis as thought emoblic from low EF and findings on TEE. brilinta was also continued with recent NSTEMI and PTCA in RCA.  A/P: Patient has neuro follow up in February. Given embolic source likely- continue eliquis. Discharge summary mentions using aspirin for 30 days but last cards note before discharge states eliquis and brilinta only.

## 2015-02-08 NOTE — Patient Instructions (Addendum)
No changes  I am going to label you as having chronic kidney disease (but honestly you are right on the border). You are already doing all the right stuff for this (rampril, blood pressure and cholesterol control)

## 2015-02-08 NOTE — Assessment & Plan Note (Addendum)
Recent NSTEMI  S: patient asymptomatic at last visit but we referred to cardiology to have him plugged backed in. Unfortunately developed an NSTEMI 01/14/15 and went to ED and ultimately had RCA DES 01/16/15 and is now on Brilinta. Has been continued on statin while aspirin stopped as now on eliquis. NSTEMI predated CVA by about 7-10 days and apparently had prior CVA after catheterization.  A/P:continue cardiology follow up. Asymptomatic today again.

## 2015-02-16 ENCOUNTER — Ambulatory Visit (HOSPITAL_COMMUNITY): Payer: 59

## 2015-02-17 ENCOUNTER — Other Ambulatory Visit: Payer: Self-pay | Admitting: Family Medicine

## 2015-02-17 ENCOUNTER — Encounter: Payer: Self-pay | Admitting: Family Medicine

## 2015-02-17 ENCOUNTER — Other Ambulatory Visit (INDEPENDENT_AMBULATORY_CARE_PROVIDER_SITE_OTHER): Payer: 59

## 2015-02-17 ENCOUNTER — Ambulatory Visit: Payer: 59 | Admitting: Family Medicine

## 2015-02-17 DIAGNOSIS — E039 Hypothyroidism, unspecified: Secondary | ICD-10-CM | POA: Insufficient documentation

## 2015-02-17 DIAGNOSIS — E785 Hyperlipidemia, unspecified: Secondary | ICD-10-CM | POA: Diagnosis not present

## 2015-02-17 LAB — TSH: TSH: 12.19 u[IU]/mL — AB (ref 0.35–4.50)

## 2015-02-17 LAB — LDL CHOLESTEROL, DIRECT: Direct LDL: 90 mg/dL

## 2015-02-18 ENCOUNTER — Other Ambulatory Visit: Payer: Self-pay | Admitting: Endocrinology

## 2015-02-20 ENCOUNTER — Encounter (HOSPITAL_COMMUNITY): Payer: 59

## 2015-02-20 MED ORDER — LEVOTHYROXINE SODIUM 50 MCG PO TABS: 50.0000 ug | ORAL_TABLET | Freq: Every day | ORAL | Status: DC

## 2015-02-22 ENCOUNTER — Encounter (HOSPITAL_COMMUNITY): Payer: 59

## 2015-02-23 ENCOUNTER — Encounter (HOSPITAL_COMMUNITY)
Admission: RE | Admit: 2015-02-23 | Discharge: 2015-02-23 | Disposition: A | Discharge status: Home / Self Care | Payer: 59 | Source: Ambulatory Visit | Attending: Cardiovascular Disease | Admitting: Cardiovascular Disease

## 2015-02-23 DIAGNOSIS — I471 Supraventricular tachycardia: Secondary | ICD-10-CM | POA: Insufficient documentation

## 2015-02-23 DIAGNOSIS — I5022 Chronic systolic (congestive) heart failure: Secondary | ICD-10-CM | POA: Insufficient documentation

## 2015-02-23 DIAGNOSIS — I10 Essential (primary) hypertension: Secondary | ICD-10-CM | POA: Insufficient documentation

## 2015-02-23 DIAGNOSIS — I255 Ischemic cardiomyopathy: Secondary | ICD-10-CM | POA: Insufficient documentation

## 2015-02-23 DIAGNOSIS — N182 Chronic kidney disease, stage 2 (mild): Secondary | ICD-10-CM | POA: Insufficient documentation

## 2015-02-23 DIAGNOSIS — I2510 Atherosclerotic heart disease of native coronary artery without angina pectoris: Secondary | ICD-10-CM | POA: Insufficient documentation

## 2015-02-23 DIAGNOSIS — E785 Hyperlipidemia, unspecified: Secondary | ICD-10-CM | POA: Insufficient documentation

## 2015-02-23 DIAGNOSIS — I6340 Cerebral infarction due to embolism of unspecified cerebral artery: Secondary | ICD-10-CM | POA: Insufficient documentation

## 2015-02-23 NOTE — Progress Notes (Signed)
Cardiac Rehab Medication Review by a Pharmacist  Does the patient  feel that his/her medications are working for him/her?  yes  Has the patient been experiencing any side effects to the medications prescribed?  yes  Does the patient measure his/her own blood pressure or blood glucose at home?  Only Blood glucose   Does the patient have any problems obtaining medications due to transportation or finances?   no  Understanding of regimen: excellent Understanding of indications: excellent Potential of compliance: excellent    Pharmacist comments: Pt had excellent understanding of his medications and demonstrated compliance with his regimen.  Pt had no cost barriers with his medications.  Pt denied bleeding with his antiplatelet/anticoagulant agents.    John Carrillo 02/23/2015 8:50 AM

## 2015-02-24 ENCOUNTER — Encounter (HOSPITAL_COMMUNITY): Payer: 59

## 2015-02-27 ENCOUNTER — Encounter (HOSPITAL_COMMUNITY)
Admission: RE | Admit: 2015-02-27 | Discharge: 2015-02-27 | Disposition: A | Discharge status: Home / Self Care | Payer: 59 | Source: Ambulatory Visit | Attending: Cardiovascular Disease | Admitting: Cardiovascular Disease

## 2015-02-27 ENCOUNTER — Encounter (HOSPITAL_COMMUNITY): Payer: 59

## 2015-02-27 DIAGNOSIS — I471 Supraventricular tachycardia: Secondary | ICD-10-CM | POA: Diagnosis not present

## 2015-02-27 DIAGNOSIS — E785 Hyperlipidemia, unspecified: Secondary | ICD-10-CM | POA: Diagnosis not present

## 2015-02-27 DIAGNOSIS — I6340 Cerebral infarction due to embolism of unspecified cerebral artery: Secondary | ICD-10-CM | POA: Diagnosis not present

## 2015-02-27 DIAGNOSIS — N182 Chronic kidney disease, stage 2 (mild): Secondary | ICD-10-CM | POA: Diagnosis not present

## 2015-02-27 DIAGNOSIS — I2510 Atherosclerotic heart disease of native coronary artery without angina pectoris: Secondary | ICD-10-CM | POA: Diagnosis not present

## 2015-02-27 DIAGNOSIS — I10 Essential (primary) hypertension: Secondary | ICD-10-CM | POA: Diagnosis not present

## 2015-02-27 DIAGNOSIS — I5022 Chronic systolic (congestive) heart failure: Secondary | ICD-10-CM | POA: Diagnosis not present

## 2015-02-27 DIAGNOSIS — I255 Ischemic cardiomyopathy: Secondary | ICD-10-CM | POA: Diagnosis not present

## 2015-02-27 LAB — GLUCOSE, CAPILLARY
GLUCOSE-CAPILLARY: 223 mg/dL — AB (ref 65–99)
GLUCOSE-CAPILLARY: 212 mg/dL — AB (ref 65–99)

## 2015-02-27 NOTE — Progress Notes (Signed)
Pt started cardiac rehab today.  Pt tolerated light exercise without difficulty. VSS, telemetry-Sinus with a bundle branch and a first degree heart block this has been previously documented, asymptomatic.  Medication list reconciled.  Pt verbalized compliance with medications and denies barriers to compliance. PSYCHOSOCIAL ASSESSMENT:  PHQ-0. Pt exhibits positive coping skills, hopeful outlook with supportive family. No psychosocial needs identified at this time, no psychosocial interventions necessary.    Pt enjoys racing saliboats.   Pt cardiac rehab  goal is  to improve muscle strength .  Pt encouraged to participate in exercising on your own and functional fitness  to increase ability to achieve these goals.   Pt long term cardiac rehab goal is to increase strength and to loose his waistline.  Pt oriented to exercise equipment and routine.  Understanding verbalized. John Carrillo plans to switch to the 6:45 class on Wednesday due to his work schedule. Will continue to monitor the patient throughout  the program.

## 2015-03-01 ENCOUNTER — Encounter (HOSPITAL_COMMUNITY): Payer: 59

## 2015-03-01 ENCOUNTER — Encounter (HOSPITAL_COMMUNITY)
Admission: RE | Admit: 2015-03-01 | Discharge: 2015-03-01 | Disposition: A | Discharge status: Home / Self Care | Payer: 59 | Source: Ambulatory Visit | Attending: Cardiovascular Disease | Admitting: Cardiovascular Disease

## 2015-03-01 DIAGNOSIS — I2510 Atherosclerotic heart disease of native coronary artery without angina pectoris: Secondary | ICD-10-CM | POA: Diagnosis not present

## 2015-03-01 LAB — GLUCOSE, CAPILLARY
GLUCOSE-CAPILLARY: 184 mg/dL — AB (ref 65–99)
Glucose-Capillary: 152 mg/dL — ABNORMAL HIGH (ref 65–99)

## 2015-03-03 ENCOUNTER — Encounter (HOSPITAL_COMMUNITY): Payer: 59

## 2015-03-03 ENCOUNTER — Encounter: Payer: Self-pay | Admitting: Endocrinology

## 2015-03-03 ENCOUNTER — Encounter: Payer: 59 | Admitting: Gastroenterology

## 2015-03-03 ENCOUNTER — Ambulatory Visit (INDEPENDENT_AMBULATORY_CARE_PROVIDER_SITE_OTHER): Payer: 59 | Admitting: Endocrinology

## 2015-03-03 VITALS — BP 122/86 | HR 59 | Temp 97.8°F | Ht 68.0 in | Wt 173.0 lb

## 2015-03-03 DIAGNOSIS — E1122 Type 2 diabetes mellitus with diabetic chronic kidney disease: Secondary | ICD-10-CM | POA: Diagnosis not present

## 2015-03-03 DIAGNOSIS — Z794 Long term (current) use of insulin: Secondary | ICD-10-CM | POA: Diagnosis not present

## 2015-03-03 DIAGNOSIS — N183 Chronic kidney disease, stage 3 unspecified: Secondary | ICD-10-CM

## 2015-03-03 MED ORDER — INSULIN ASPART PROT & ASPART (70-30 MIX) 100 UNIT/ML PEN: PEN_INJECTOR | SUBCUTANEOUS | Status: DC

## 2015-03-03 NOTE — Progress Notes (Signed)
Subjective:    Patient ID: John Carrillo, male    DOB: 08-10-1950, 64 y.o.   MRN: [id number]  HPI Pt returns for f/u of diabetes mellitus: DM type: 1 Dx'ed: 2008. Complications: CVA, PAD, renal insufficiency, and CAD. Therapy: insulin since soon after dx. DKA: never Severe hypoglycemia: never Pancreatitis: never Other: he declines pump; he declines multiple daily injections.   Interval history: He seldom has hypoglycemia, and these episodes are mild.  This usually happens at lunch.  no cbg record, but states cbg's are highest in am.  pt states he feels well in general.  Past Medical History  Diagnosis Date  . CORONARY ARTERY DISEASE 08/11/2006  . CVA 12/19/2006    2008  . DIABETES MELLITUS, TYPE I 08/11/2006  . ECZEMA, HANDS 12/15/2009  . HYPERLIPIDEMIA 08/11/2006  . HYPERTENSION 08/11/2006  . MYOCARDIAL INFARCTION, HX OF 08/11/2006    07/14/01  . Blindness of left eye 01/25/2015  . NSTEMI (non-ST elevated myocardial infarction) (HCC) 01/15/2015  . Acute kidney injury superimposed on chronic kidney disease (HCC) 01/25/2015    Past Surgical History  Procedure Laterality Date  . Tonsilectomy, adenoidectomy, bilateral myringotomy and tubes    . Coronary stent placement    . Cardiac catheterization N/A 01/16/2015    Procedure: Left Heart Cath and Coronary Angiography;  Surgeon: [name], MD;  Location: [facility] INVASIVE CV LAB;  Service: Cardiovascular;  Laterality: N/A;  . Tee without cardioversion N/A 01/26/2015    Procedure: TRANSESOPHAGEAL ECHOCARDIOGRAM (TEE);  Surgeon: [name], MD;  Location: [facility] ENDOSCOPY;  Service: Cardiovascular;  Laterality: N/A;    Social History   Social History  . Marital Status: Married    Spouse Name: N/A  . Number of Children: N/A  . Years of Education: N/A   Occupational History  . Writer   Social History Main Topics  . Smoking status: Former Smoker -- 1.00 packs/day for 30 years    Types: Cigarettes      Quit date: 05/03/2000  . Smokeless tobacco: Not on file  . Alcohol Use: 0.0 oz/week    0 Standard drinks or equivalent per week     Comment: 1-2/month  . Drug Use: No  . Sexual Activity: Not on file   Other Topics Concern  . Not on file   Social History Narrative   Married (kids are patients here, wife seen elsewhere). 3 step children, 1 biological. No grandkids.    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 acetaminophen (TYLENOL) 325 MG tablet Take 2 tablets (650 mg total) by mouth every 4 (four) hours as needed for headache or mild pain.    [name] apixaban (ELIQUIS) 5 MG TABS tablet Take 1 tablet (5 mg total) by mouth 2 (two) times daily. 60 tablet 11  . atorvastatin (LIPITOR) 40 MG tablet Take 1 tablet (40 mg total) by mouth daily. 30 tablet 11  . carvedilol (COREG) 6.25 MG tablet Take 1 tablet (6.25 mg total) by mouth 2 (two) times daily with a meal. 60 tablet 11  . glucose blood (ONETOUCH VERIO) test strip 1 each by Other route 4 (four) times daily.  And lances 4/day E11.9 120 each 3  . Insulin Pen Needle (BD ULTRA-FINE PEN NEEDLES) 29G X 12.7MM MISC use as directed twice a day 200 each 2  . levothyroxine (SYNTHROID, LEVOTHROID) 50 MCG tablet Take 1 tablet (50 mcg total) by mouth daily. 30 tablet 5  . nitroGLYCERIN (NITROSTAT) 0.4 MG SL tablet Place 1 tablet (0.4 mg total) under the tongue every 5 (five) minutes as needed for chest pain. Chest pain 30 tablet 5  . Probiotic Product (PROBIOTIC PO) Take 1 capsule by mouth every morning.    . ramipril (ALTACE) 5 MG capsule Take 1 capsule (5 mg total) by mouth daily. 60 capsule 11  . spironolactone (ALDACTONE) 25 MG tablet Take 1  tablet (25 mg total) by mouth daily. 30 tablet 11  . ticagrelor (BRILINTA) 90 MG TABS tablet Take 1 tablet (90 mg total) by mouth 2 (two) times daily. 60 tablet 11   No current facility-administered medications on file prior to visit.    No Known Allergies  Family History  Problem Relation Age of Onset  . Diabetes Neg Hx   . CAD Father     age 64  . CAD Paternal Grandfather     48s  . CAD Paternal Grandmother     64s  . Brain cancer Maternal Grandmother     BP 122/86 mmHg  Pulse 59  Temp(Src) 97.8 F (36.6 C) (Oral)  Ht  (1.727 m)  Wt 173 lb (78.472 kg)  BMI 26.31 kg/m2  SpO2 97%   Review of Systems Denies LOC    Objective:   Physical Exam VITAL SIGNS:  See vs page GENERAL: no distress Pulses: dorsalis pedis intact bilat.   MSK: no deformity of the feet CV: no leg edema Skin:  no ulcer on the feet.  normal color and temp on the feet. Neuro: sensation is intact to touch on the feet.  Lab Results  Component Value Date   HGBA1C 9.3* 01/15/2015      Assessment & Plan:  DM: poor control.  therapy limited by noncompliance with cbg recording.    Patient is advised the following: Patient Instructions  check your blood sugar 4 times a day--before the 3 meals, and at bedtime.  also check if you have symptoms of your blood sugar being too high or too low.  please keep a record of the readings and bring it to your next appointment here.  please call us sooner if you are having low blood sugar episodes.  Please come back a follow-up appointment in 2 months.   For now, please change the insulin to 10 units with breakfast, and 30 units with the evening meal.   Another option is to take lantus in the morning, and humalog in the evening.  Please let me know if you want to change.

## 2015-03-03 NOTE — Patient Instructions (Addendum)
check your blood sugar 4 times a day--before the 3 meals, and at bedtime.  also check if you have symptoms of your blood sugar being too high or too low.  please keep a record of the readings and bring it to your next appointment here.  please call us sooner if you are having low blood sugar episodes.  Please come back a follow-up appointment in 2 months.   For now, please change the insulin to 10 units with breakfast, and 30 units with the evening meal.   Another option is to take lantus in the morning, and humalog in the evening.  Please let me know if you want to change.

## 2015-03-06 ENCOUNTER — Encounter (HOSPITAL_COMMUNITY): Payer: 59

## 2015-03-08 ENCOUNTER — Encounter (HOSPITAL_COMMUNITY): Payer: 59

## 2015-03-08 ENCOUNTER — Encounter (HOSPITAL_COMMUNITY)
Admission: RE | Admit: 2015-03-08 | Discharge: 2015-03-08 | Disposition: A | Discharge status: Home / Self Care | Payer: 59 | Source: Ambulatory Visit | Attending: Cardiovascular Disease | Admitting: Cardiovascular Disease

## 2015-03-08 DIAGNOSIS — I471 Supraventricular tachycardia: Secondary | ICD-10-CM | POA: Insufficient documentation

## 2015-03-08 DIAGNOSIS — I5022 Chronic systolic (congestive) heart failure: Secondary | ICD-10-CM | POA: Insufficient documentation

## 2015-03-08 DIAGNOSIS — N182 Chronic kidney disease, stage 2 (mild): Secondary | ICD-10-CM | POA: Diagnosis not present

## 2015-03-08 DIAGNOSIS — I10 Essential (primary) hypertension: Secondary | ICD-10-CM | POA: Insufficient documentation

## 2015-03-08 DIAGNOSIS — I6340 Cerebral infarction due to embolism of unspecified cerebral artery: Secondary | ICD-10-CM | POA: Insufficient documentation

## 2015-03-08 DIAGNOSIS — E785 Hyperlipidemia, unspecified: Secondary | ICD-10-CM | POA: Insufficient documentation

## 2015-03-08 DIAGNOSIS — I255 Ischemic cardiomyopathy: Secondary | ICD-10-CM | POA: Diagnosis not present

## 2015-03-08 DIAGNOSIS — I2510 Atherosclerotic heart disease of native coronary artery without angina pectoris: Secondary | ICD-10-CM | POA: Insufficient documentation

## 2015-03-08 LAB — GLUCOSE, CAPILLARY
GLUCOSE-CAPILLARY: 256 mg/dL — AB (ref 65–99)
Glucose-Capillary: 292 mg/dL — ABNORMAL HIGH (ref 65–99)

## 2015-03-08 NOTE — Progress Notes (Signed)
QUALITY OF LIFE SCORE REVIEW  Pt completed Quality of Life survey as a participant in Cardiac Rehab. Scores 21.0 or below are considered low. Pt score very low in several areas Overall 21.90, Health and Function 21.13, socioeconomic 21.86, physiological and spiritual 22.07, family 24.00. Patient quality of life slightly altered by physical constraints which limits ability to perform as prior to recent cardiac illness.  Pt cardiac event was a surprise to him. Pt perceived he was faced with a near death experience. Pt realizes he needs to take better care of himself particular with his DM.  Offered emotional support and reassurance.  Will continue to monitor and intervene as necessary.

## 2015-03-10 ENCOUNTER — Encounter (HOSPITAL_COMMUNITY): Payer: 59

## 2015-03-10 ENCOUNTER — Encounter (HOSPITAL_COMMUNITY)
Admission: RE | Admit: 2015-03-10 | Discharge: 2015-03-10 | Disposition: A | Discharge status: Home / Self Care | Payer: 59 | Source: Ambulatory Visit | Attending: Cardiovascular Disease | Admitting: Cardiovascular Disease

## 2015-03-10 DIAGNOSIS — I2510 Atherosclerotic heart disease of native coronary artery without angina pectoris: Secondary | ICD-10-CM | POA: Diagnosis not present

## 2015-03-10 LAB — GLUCOSE, CAPILLARY
GLUCOSE-CAPILLARY: 165 mg/dL — AB (ref 65–99)
Glucose-Capillary: 114 mg/dL — ABNORMAL HIGH (ref 65–99)

## 2015-03-13 ENCOUNTER — Encounter (HOSPITAL_COMMUNITY): Payer: 59

## 2015-03-15 ENCOUNTER — Encounter (HOSPITAL_COMMUNITY)
Admission: RE | Admit: 2015-03-15 | Discharge: 2015-03-15 | Disposition: A | Discharge status: Home / Self Care | Payer: 59 | Source: Ambulatory Visit | Attending: Cardiovascular Disease | Admitting: Cardiovascular Disease

## 2015-03-15 ENCOUNTER — Encounter (HOSPITAL_COMMUNITY): Payer: 59

## 2015-03-15 DIAGNOSIS — I2510 Atherosclerotic heart disease of native coronary artery without angina pectoris: Secondary | ICD-10-CM | POA: Diagnosis not present

## 2015-03-15 NOTE — Progress Notes (Signed)
Reviewed home exercise with pt today.  Pt plans to walk and attend [facility] for exercise.  Reviewed THR, pulse, RPE, sign and symptoms, NTG use, and when to call 911 or MD.  Pt voiced understanding.    John Carrillo [employer]

## 2015-03-17 ENCOUNTER — Encounter (HOSPITAL_COMMUNITY): Payer: 59

## 2015-03-17 ENCOUNTER — Other Ambulatory Visit (INDEPENDENT_AMBULATORY_CARE_PROVIDER_SITE_OTHER): Payer: 59 | Admitting: *Deleted

## 2015-03-17 ENCOUNTER — Encounter (HOSPITAL_COMMUNITY)
Admission: RE | Admit: 2015-03-17 | Discharge: 2015-03-17 | Disposition: A | Discharge status: Home / Self Care | Payer: 59 | Source: Ambulatory Visit | Attending: Cardiovascular Disease | Admitting: Cardiovascular Disease

## 2015-03-17 DIAGNOSIS — Z9861 Coronary angioplasty status: Secondary | ICD-10-CM

## 2015-03-17 DIAGNOSIS — N182 Chronic kidney disease, stage 2 (mild): Secondary | ICD-10-CM

## 2015-03-17 DIAGNOSIS — I255 Ischemic cardiomyopathy: Secondary | ICD-10-CM

## 2015-03-17 DIAGNOSIS — I5022 Chronic systolic (congestive) heart failure: Secondary | ICD-10-CM

## 2015-03-17 DIAGNOSIS — I10 Essential (primary) hypertension: Secondary | ICD-10-CM

## 2015-03-17 DIAGNOSIS — I471 Supraventricular tachycardia, unspecified: Secondary | ICD-10-CM

## 2015-03-17 DIAGNOSIS — E785 Hyperlipidemia, unspecified: Secondary | ICD-10-CM

## 2015-03-17 DIAGNOSIS — I2510 Atherosclerotic heart disease of native coronary artery without angina pectoris: Secondary | ICD-10-CM | POA: Diagnosis not present

## 2015-03-17 DIAGNOSIS — I6340 Cerebral infarction due to embolism of unspecified cerebral artery: Secondary | ICD-10-CM

## 2015-03-17 LAB — CBC WITH DIFFERENTIAL/PLATELET
BASOS ABS: 0 10*3/uL (ref 0.0–0.1)
Basophils Relative: 0 % (ref 0–1)
EOS ABS: 0.2 10*3/uL (ref 0.0–0.7)
EOS PCT: 3 % (ref 0–5)
HCT: 41.9 % (ref 39.0–52.0)
Hemoglobin: 14.2 g/dL (ref 13.0–17.0)
LYMPHS ABS: 1.5 10*3/uL (ref 0.7–4.0)
Lymphocytes Relative: 20 % (ref 12–46)
MCH: 32.8 pg (ref 26.0–34.0)
MCHC: 33.9 g/dL (ref 30.0–36.0)
MCV: 96.8 fL (ref 78.0–100.0)
MPV: 10.6 fL (ref 8.6–12.4)
Monocytes Absolute: 1.1 10*3/uL — ABNORMAL HIGH (ref 0.1–1.0)
Monocytes Relative: 14 % — ABNORMAL HIGH (ref 3–12)
Neutro Abs: 4.9 10*3/uL (ref 1.7–7.7)
Neutrophils Relative %: 63 % (ref 43–77)
PLATELETS: 198 10*3/uL (ref 150–400)
RBC: 4.33 MIL/uL (ref 4.22–5.81)
RDW: 13.3 % (ref 11.5–15.5)
WBC: 7.7 10*3/uL (ref 4.0–10.5)

## 2015-03-17 LAB — BASIC METABOLIC PANEL
BUN: 23 mg/dL (ref 7–25)
CHLORIDE: 103 mmol/L (ref 98–110)
CO2: 25 mmol/L (ref 20–31)
Calcium: 9.2 mg/dL (ref 8.6–10.3)
Creat: 1.24 mg/dL (ref 0.70–1.25)
Glucose, Bld: 130 mg/dL — ABNORMAL HIGH (ref 65–99)
Potassium: 4.2 mmol/L (ref 3.5–5.3)
Sodium: 138 mmol/L (ref 135–146)

## 2015-03-17 LAB — LIPID PANEL
Cholesterol: 177 mg/dL (ref 125–200)
HDL: 52 mg/dL (ref >=40)
LDL Cholesterol: 105 mg/dL (ref <130)
Total CHOL/HDL Ratio: 3.4 Ratio (ref <=5.0)
Triglycerides: 102 mg/dL (ref <150)
VLDL: 20 mg/dL (ref <30)

## 2015-03-17 LAB — HEPATIC FUNCTION PANEL
ALBUMIN: 3.5 g/dL — AB (ref 3.6–5.1)
ALT: 26 U/L (ref 9–46)
AST: 26 U/L (ref 10–35)
Alkaline Phosphatase: 53 U/L (ref 40–115)
BILIRUBIN INDIRECT: 0.4 mg/dL (ref 0.2–1.2)
Bilirubin, Direct: 0.1 mg/dL (ref <=0.2)
TOTAL PROTEIN: 6.3 g/dL (ref 6.1–8.1)
Total Bilirubin: 0.5 mg/dL (ref 0.2–1.2)

## 2015-03-20 ENCOUNTER — Telehealth: Payer: Self-pay | Admitting: *Deleted

## 2015-03-20 ENCOUNTER — Encounter (HOSPITAL_COMMUNITY): Payer: 59

## 2015-03-20 ENCOUNTER — Encounter (HOSPITAL_COMMUNITY)
Admission: RE | Admit: 2015-03-20 | Discharge: 2015-03-20 | Disposition: A | Discharge status: Home / Self Care | Payer: 59 | Source: Ambulatory Visit | Attending: Cardiovascular Disease | Admitting: Cardiovascular Disease

## 2015-03-20 DIAGNOSIS — Z9861 Coronary angioplasty status: Secondary | ICD-10-CM

## 2015-03-20 DIAGNOSIS — I2510 Atherosclerotic heart disease of native coronary artery without angina pectoris: Secondary | ICD-10-CM | POA: Diagnosis not present

## 2015-03-20 DIAGNOSIS — E785 Hyperlipidemia, unspecified: Secondary | ICD-10-CM

## 2015-03-20 MED ORDER — ATORVASTATIN CALCIUM 80 MG PO TABS: 80.0000 mg | ORAL_TABLET | Freq: Every day | ORAL | Status: DC

## 2015-03-20 NOTE — Telephone Encounter (Signed)
pt has been notified of lab results and findings by phone with verbal understanding to increase lipitor to 80 mg daily, FLP/LFT 5/19.

## 2015-03-22 ENCOUNTER — Encounter (HOSPITAL_COMMUNITY)
Admission: RE | Admit: 2015-03-22 | Discharge: 2015-03-22 | Disposition: A | Discharge status: Home / Self Care | Payer: 59 | Source: Ambulatory Visit | Attending: Cardiovascular Disease | Admitting: Cardiovascular Disease

## 2015-03-22 ENCOUNTER — Encounter (HOSPITAL_COMMUNITY): Payer: 59

## 2015-03-22 DIAGNOSIS — I2510 Atherosclerotic heart disease of native coronary artery without angina pectoris: Secondary | ICD-10-CM | POA: Diagnosis not present

## 2015-03-24 ENCOUNTER — Encounter (HOSPITAL_COMMUNITY): Payer: 59

## 2015-03-24 ENCOUNTER — Encounter (HOSPITAL_COMMUNITY)
Admission: RE | Admit: 2015-03-24 | Discharge: 2015-03-24 | Disposition: A | Discharge status: Home / Self Care | Payer: 59 | Source: Ambulatory Visit | Attending: Cardiovascular Disease | Admitting: Cardiovascular Disease

## 2015-03-24 DIAGNOSIS — I2510 Atherosclerotic heart disease of native coronary artery without angina pectoris: Secondary | ICD-10-CM | POA: Diagnosis not present

## 2015-03-24 NOTE — Progress Notes (Signed)
Nutrition Note Spoke with pt. Pt has not returned his nutrition survey because "I can't find it." This Clinical research associate offered to complete the survey with pt. Pt requested another copy of nutrition survey to complete. Will f/u with pt re: nutrition plan after survey returned.Continue client-centered nutrition education by RD as part of interdisciplinary care.  Monitor and evaluate progress toward nutrition goal with team.  Mickle Plumb, M.Ed, RD, LDN, CDE 03/24/2015 8:33 AM

## 2015-03-27 ENCOUNTER — Encounter (HOSPITAL_COMMUNITY)
Admission: RE | Admit: 2015-03-27 | Discharge: 2015-03-27 | Disposition: A | Discharge status: Home / Self Care | Payer: 59 | Source: Ambulatory Visit | Attending: Cardiovascular Disease | Admitting: Cardiovascular Disease

## 2015-03-27 ENCOUNTER — Encounter (HOSPITAL_COMMUNITY): Payer: 59

## 2015-03-27 DIAGNOSIS — I2510 Atherosclerotic heart disease of native coronary artery without angina pectoris: Secondary | ICD-10-CM | POA: Diagnosis not present

## 2015-03-29 ENCOUNTER — Encounter (HOSPITAL_COMMUNITY)
Admission: RE | Admit: 2015-03-29 | Discharge: 2015-03-29 | Disposition: A | Discharge status: Home / Self Care | Payer: 59 | Source: Ambulatory Visit | Attending: Cardiovascular Disease | Admitting: Cardiovascular Disease

## 2015-03-29 ENCOUNTER — Encounter (HOSPITAL_COMMUNITY): Payer: 59

## 2015-03-29 DIAGNOSIS — I2510 Atherosclerotic heart disease of native coronary artery without angina pectoris: Secondary | ICD-10-CM | POA: Diagnosis not present

## 2015-03-31 ENCOUNTER — Ambulatory Visit: Payer: 59 | Admitting: Cardiovascular Disease

## 2015-03-31 ENCOUNTER — Encounter (HOSPITAL_COMMUNITY): Payer: 59

## 2015-03-31 ENCOUNTER — Encounter (HOSPITAL_COMMUNITY)
Admission: RE | Admit: 2015-03-31 | Discharge: 2015-03-31 | Disposition: A | Discharge status: Home / Self Care | Payer: 59 | Source: Ambulatory Visit | Attending: Cardiovascular Disease | Admitting: Cardiovascular Disease

## 2015-03-31 DIAGNOSIS — I2510 Atherosclerotic heart disease of native coronary artery without angina pectoris: Secondary | ICD-10-CM | POA: Diagnosis not present

## 2015-03-31 NOTE — Progress Notes (Signed)
John Carrillo 65 y.o. male Nutrition Note Spoke with pt. Nutrition Plan and Nutrition Survey goals reviewed with pt. Pt is following Step 2 of the Therapeutic Lifestyle Changes diet. Pt is diabetic. Last A1c indicates blood poorly well-controlled. Pt checks CBG's 4 times a day. Fasting CBG's reportedly "in the low 200's." Pt takes 70/30 Novolog mix BID. Per discussion, pre-lunch CBG's 80-100 mg/dL and pre-dinner CBG's "in the low 100's."  Discussed the need to improving fasting CBG's with pt. Pt stated, "I have an appointment in a month with Dr. Everardo All." Pt with dx of CHF. Per discussion, pt does not use canned/convenience foods often. Pt eats out 3-4 meals/week and tries to choose foods low in sodium (e.g. Salads at [facility]). Pt expressed understanding of the information reviewed. Pt aware of nutrition education classes offered and is unable to attend nutrition classes.  Lab Results  Component Value Date   HGBA1C 9.3* 01/15/2015   Wt Readings from Last 3 Encounters:  03/03/15 173 lb (78.472 kg)  02/23/15 178 lb 5.6 oz (80.9 kg)  02/08/15 177 lb (80.287 kg)    Nutrition Diagnosis ? Food-and nutrition-related knowledge deficit related to lack of exposure to information as related to diagnosis of: ? CVD ? DM  Nutrition RX/ Estimated Daily Nutrition Needs for: wt  maintenance  2200-2500 Kcal, 75-80 gm fat, 14-17 gm sat fat, 2.2-2.5 gm trans-fat, <1500 mg sodium, 325 gm CHO   Nutrition Intervention ? Pt's individual nutrition plan reviewed with pt. ? Benefits of adopting Therapeutic Lifestyle Changes discussed when Medficts reviewed. ? Pt to attend the Portion Distortion class ? Pt to attend the Diabetes Q & A class  ? Pt given handouts for: ? Nutrition I class ? Nutrition II class ? Diabetes Blitz Class ? Continue client-centered nutrition education by RD, as part of interdisciplinary care. Goal(s) ? CBG concentrations in the normal range or as close to normal as is safely  possible. ? Use pre-meal and post-meal CBG's and A1c to determine whether adjustments in food/meal planning will be beneficial or if any meds need to be combined with nutrition therapy. Monitor and Evaluate progress toward nutrition goal with team. Mickle Plumb, M.Ed, RD, LDN, CDE 03/31/2015 8:25 AM

## 2015-04-03 ENCOUNTER — Encounter (HOSPITAL_COMMUNITY): Payer: 59

## 2015-04-03 ENCOUNTER — Encounter (HOSPITAL_COMMUNITY)
Admission: RE | Admit: 2015-04-03 | Discharge: 2015-04-03 | Disposition: A | Discharge status: Home / Self Care | Payer: 59 | Source: Ambulatory Visit | Attending: Cardiovascular Disease | Admitting: Cardiovascular Disease

## 2015-04-03 DIAGNOSIS — I2510 Atherosclerotic heart disease of native coronary artery without angina pectoris: Secondary | ICD-10-CM | POA: Diagnosis not present

## 2015-04-04 ENCOUNTER — Ambulatory Visit (INDEPENDENT_AMBULATORY_CARE_PROVIDER_SITE_OTHER): Payer: 59 | Admitting: Neurology

## 2015-04-04 ENCOUNTER — Encounter: Payer: Self-pay | Admitting: Neurology

## 2015-04-04 VITALS — BP 99/71 | HR 67 | Ht 68.0 in | Wt 176.6 lb

## 2015-04-04 DIAGNOSIS — I639 Cerebral infarction, unspecified: Secondary | ICD-10-CM | POA: Diagnosis not present

## 2015-04-04 NOTE — Progress Notes (Signed)
[facility] [address] [place]. [place] [address] [phone]       OFFICE FOLLOW-UP NOTE  Mr. ANTRONE WALLA Date of Birth:  [birth year] Medical Record Number:  [id number]   HPI: Mr Albers is a 3 year Caucasian male seen today for first office follow-up visit following hospital consultation for stroke in December 2016. SAMER DUTTON is an 65 y.o. male hx of CAD, prior CVA, DM, HLD, HTN presenting with sudden change in vision in his left eye. He notes sudden loss of vision in his left eye. Reports vision loss was the majority of the temporal visual field with a crescent shaped intact visual field in his nasal field of the left eye. Reports intact vision and visual fields in his right eye. Denies any associated weakness, sensory or speech deficits. Symptoms lasted around 1-2 hours and is now back to his baseline. Had recent heart cath and stent placement 1 week ago. Had 2D echo on 12/12 showing LV EF of 15% with diffuse hypokinesis. No eye pain, no visual auras during the episode. No jaw claudication, no temporal pain. Also he had transient left periorbital pressure headache during this episode. Not his typical migraine headache. Patient does have a history of migraine with visual aura though he has never had a visual deficit during a migraine.  CT head imaging on admission personally reviewed, no acute abnormality noted. Patient was not administered TPA secondary to resolved symptoms. He was admitted for further evaluation and treatment. MRI scan of the brain personally reviewed on 01/25/15 showed 11 mm focus of acute ischemia in the right occipital lobe. Old remote age left MCA infarct was noted with mild to moderate changes of chronic small vessel disease. MRA of the brain showed distal right posterior cerebral artery occlusion. Transthoracic echo showed diminished ejection fraction of 15% with diffuse hypokinesis involving the septum and apex. There is no definite clot  noted. Transesophageal echocardiogram showed severe LV dysfunction and anterior septal and apical akinesis but no definite thrombus however prominent spontaneous echo contrast was seen in the LV apex as well as left atrium and atrial appendage. LDL cholesterol was elevated at 102 and hemoglobin A1c at 9.3. Patient was started on eliquis for his low ejection fraction and continued on aspirin and Brilinta given his recent cardiac stent for a month. His done well without significant bleeding or bruising complications. He has stopped aspirin and is currently on Brilinta and eliquis and tolerating both well with only minor bruising. He has not had any recurrent stroke or TIA symptoms. He states his blood pressure tends to run low in the 90-100 range. His repeat lipid profile showed LDL of 100 and Lipitor was increased to 80 mg daily which is tolerating well. His hemoglobin A1c is still elevated at 8 and he has increased his dose of insulin in the morning and at night. He is eating healthy and wants to exercise more but his stamina is limited due to his cardiac condition. He was advised to consider LifeVest or defibrillator but he seems reluctant ROS:   14 system review of systems is positive for  easy bruising, hearing loss, memory loss, snoring and all other systems negative PMH:  Past Medical History  Diagnosis Date  . CORONARY ARTERY DISEASE 08/11/2006  . CVA 12/19/2006    2008  . DIABETES MELLITUS, TYPE I 08/11/2006  . ECZEMA, HANDS 12/15/2009  . HYPERLIPIDEMIA 08/11/2006  . HYPERTENSION 08/11/2006  . MYOCARDIAL INFARCTION, HX OF 08/11/2006  07/14/01  . NSTEMI (non-ST elevated myocardial infarction) (HCC) 01/15/2015  . Acute kidney injury superimposed on chronic kidney disease (HCC) 01/25/2015    Social History:  Social History   Social History  . Marital Status: Married    Spouse Name: N/A  . Number of Children: N/A  . Years of Education: N/A   Occupational History  . Medical illustrator   Social History Main Topics  . Smoking status: Former Smoker -- 1.00 packs/day for 30 years    Types: Cigarettes    Quit date: 05/03/2000  . Smokeless tobacco: Not on file  . Alcohol Use: 1.8 oz/week    0 Standard drinks or equivalent, 1 Cans of beer, 1 Glasses of wine, 1 Shots of liquor per week     Comment: 1-2/month  . Drug Use: No  . Sexual Activity: Not on file   Other Topics Concern  . Not on file   Social History Narrative   Married (kids are patients here, wife seen elsewhere). 3 step children, 1 biological. No grandkids.    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 acetaminophen (TYLENOL) 325 MG tablet Take 2 tablets (650 mg total) by mouth every 4 (four) hours as needed for headache or mild pain.    [name] apixaban (ELIQUIS) 5 MG TABS tablet Take 1 tablet (5 mg total) by mouth 2 (two) times daily. 60 tablet 11  . atorvastatin (LIPITOR) 80 MG tablet Take 1 tablet (80 mg total) by mouth daily.    . carvedilol (COREG) 6.25 MG tablet Take 1 tablet (6.25 mg total) by mouth 2 (two) times daily with a meal. 60 tablet 11  . glucose blood (ONETOUCH VERIO) test strip 1 each by Other route 4 (four) times daily. And lances 4/day E11.9 120 each 3  . insulin aspart protamine - aspart (NOVOLOG MIX 70/30 FLEXPEN) (70-30) 100 UNIT/ML FlexPen 10 units with breakfast, and 30 units with the evening meal, and pen needles 2/day 15 mL 11  . Insulin Pen Needle (BD ULTRA-FINE PEN NEEDLES) 29G X 12.7MM MISC use as directed twice a day 200 each 2  . levothyroxine (SYNTHROID, LEVOTHROID) 50 MCG tablet Take 1 tablet (50 mcg total) by mouth daily. 30 tablet 5  . nitroGLYCERIN  (NITROSTAT) 0.4 MG SL tablet Place 1 tablet (0.4 mg total) under the tongue every 5 (five) minutes as needed for chest pain. Chest pain 30 tablet 5  . Probiotic Product (PROBIOTIC PO) Take 1 capsule by mouth every morning.    . ramipril (ALTACE) 5 MG capsule Take 1 capsule (5 mg total) by mouth daily. 60 capsule 11  . spironolactone (ALDACTONE) 25 MG tablet Take 1 tablet (25 mg total) by mouth daily. 30 tablet 11  . ticagrelor (BRILINTA) 90 MG TABS tablet Take 1 tablet (90 mg total) by mouth 2 (two) times daily. 60 tablet 11   No current facility-administered medications on file prior to visit.    Allergies:  No Known Allergies  Physical Exam General: well developed, well nourished Middle-aged Caucasian male, seated, in no evident distress Head: head normocephalic and atraumatic.  Neck: supple with no  carotid or supraclavicular bruits Cardiovascular: regular rate and rhythm, no murmurs Musculoskeletal: no deformity Skin:  no rash/petichiae Vascular:  Normal pulses all extremities Filed Vitals:   04/04/15 0859  BP: 99/71  Pulse: 67   Neurologic Exam Mental Status: Awake and fully alert. Oriented to place and time. Recent and remote memory intact. Attention span, concentration and fund of knowledge appropriate. Mood and affect appropriate.  Cranial Nerves: Fundoscopic exam reveals sharp disc margins. Pupils equal, briskly reactive to light. Extraocular movements full without nystagmus. Visual fields full to confrontation. Hearing intact. Facial sensation intact. Face, tongue, palate moves normally and symmetrically.  Motor: Normal bulk and tone. Normal strength in all tested extremity muscles. Sensory.: intact to touch ,pinprick .position and vibratory sensation.  Coordination: Rapid alternating movements normal in all extremities. Finger-to-nose and heel-to-shin performed accurately bilaterally. Gait and Station: Arises from chair without difficulty. Stance is normal. Gait demonstrates  normal stride length and balance . Able to heel, toe and tandem walk without difficulty.  Reflexes: 1+ and symmetric. Toes downgoing.   NIHSS  0 Modified Rankin  1   ASSESSMENT: 65 year old Caucasian male with right occipital infarct in December 2016 likely cardioembolic etiology from cardiomyopathy and low ejection fraction of 15%. Remote history of left MCA branch infarct in 2008. Multiple vascular risk factors of coronary artery disease, cardiomyopathy with low ejection fraction, diabetes, hyperlipidemia, remote smoking    PLAN: I had a long d/w patient about his recent stroke, low ejection fraction, risk for recurrent stroke/TIAs, personally independently reviewed imaging studies and stroke evaluation results and answered questions.Continue Eliquis for secondary stroke prevention and brillinta due to recent cardiac stent and maintain strict control of hypertension with blood pressure goal below 130/90, diabetes with hemoglobin A1c goal below 6.5% and lipids with LDL cholesterol goal below 70 mg/dL. I also advised the patient to eat a healthy diet with plenty of whole grains, cereals, fruits and vegetables, exercise regularly and maintain ideal body weight .he was advised close follow-up with his cardiologist and to consider defibrillator if his ejection fraction does not improve on follow-up echocardiogram.Greater than 50% of time during this 25 minute visit was spent on counseling,explanation of diagnosis, planning of further management, discussion with patient and family and coordination of care  Followup in the future with me only as necessary.  Delia Heady, MD  Note: This document was prepared with digital dictation and possible smart phrase technology. Any transcriptional errors that result from this process are unintentional

## 2015-04-04 NOTE — Patient Instructions (Signed)
I had a long d/w patient about his recent stroke, low ejection fraction, risk for recurrent stroke/TIAs, personally independently reviewed imaging studies and stroke evaluation results and answered questions.Continue Eliquis for secondary stroke prevention and brillinta due to recent cardiac stent and maintain strict control of hypertension with blood pressure goal below 130/90, diabetes with hemoglobin A1c goal below 6.5% and lipids with LDL cholesterol goal below 70 mg/dL. I also advised the patient to eat a healthy diet with plenty of whole grains, cereals, fruits and vegetables, exercise regularly and maintain ideal body weight .he was advised close follow-up with his cardiologist and to consider defibrillator if his ejection fraction does not improve on follow-up echocardiogram. Followup in the future with me only as necessary.  Stroke Prevention Some medical conditions and behaviors are associated with an increased chance of having a stroke. You may prevent a stroke by making healthy choices and managing medical conditions. HOW CAN I REDUCE MY RISK OF HAVING A STROKE?   Stay physically active. Get at least 30 minutes of activity on most or all days.  Do not smoke. It may also be helpful to avoid exposure to secondhand smoke.  Limit alcohol use. Moderate alcohol use is considered to be:  No more than 2 drinks per day for men.  No more than 1 drink per day for nonpregnant women.  Eat healthy foods. This involves:  Eating 5 or more servings of fruits and vegetables a day.  Making dietary changes that address high blood pressure (hypertension), high cholesterol, diabetes, or obesity.  Manage your cholesterol levels.  Making food choices that are high in fiber and low in saturated fat, trans fat, and cholesterol may control cholesterol levels.  Take any prescribed medicines to control cholesterol as directed by your health care provider.  Manage your diabetes.  Controlling your  carbohydrate and sugar intake is recommended to manage diabetes.  Take any prescribed medicines to control diabetes as directed by your health care provider.  Control your hypertension.  Making food choices that are low in salt (sodium), saturated fat, trans fat, and cholesterol is recommended to manage hypertension.  Ask your health care provider if you need treatment to lower your blood pressure. Take any prescribed medicines to control hypertension as directed by your health care provider.  If you are 9-28 years of age, have your blood pressure checked every 3-5 years. If you are 91 years of age or older, have your blood pressure checked every year.  Maintain a healthy weight.  Reducing calorie intake and making food choices that are low in sodium, saturated fat, trans fat, and cholesterol are recommended to manage weight.  Stop drug abuse.  Avoid taking birth control pills.  Talk to your health care provider about the risks of taking birth control pills if you are over 31 years old, smoke, get migraines, or have ever had a blood clot.  Get evaluated for sleep disorders (sleep apnea).  Talk to your health care provider about getting a sleep evaluation if you snore a lot or have excessive sleepiness.  Take medicines only as directed by your health care provider.  For some people, aspirin or blood thinners (anticoagulants) are helpful in reducing the risk of forming abnormal blood clots that can lead to stroke. If you have the irregular heart rhythm of atrial fibrillation, you should be on a blood thinner unless there is a good reason you cannot take them.  Understand all your medicine instructions.  Make sure that other conditions (such  as anemia or atherosclerosis) are addressed. SEEK IMMEDIATE MEDICAL CARE IF:   You have sudden weakness or numbness of the face, arm, or leg, especially on one side of the body.  Your face or eyelid droops to one side.  You have sudden  confusion.  You have trouble speaking (aphasia) or understanding.  You have sudden trouble seeing in one or both eyes.  You have sudden trouble walking.  You have dizziness.  You have a loss of balance or coordination.  You have a sudden, severe headache with no known cause.  You have new chest pain or an irregular heartbeat. Any of these symptoms may represent a serious problem that is an emergency. Do not wait to see if the symptoms will go away. Get medical help at once. Call your local emergency services (911 in [place] Do not drive yourself to the hospital.   This information is not intended to replace advice given to you by your health care provider. Make sure you discuss any questions you have with your health care provider.   Document Released: 02/29/2004 Document Revised: 02/11/2014 Document Reviewed: 07/24/2012 Elsevier Interactive Patient Education [employer]

## 2015-04-05 ENCOUNTER — Encounter (HOSPITAL_COMMUNITY): Payer: 59

## 2015-04-07 ENCOUNTER — Encounter (HOSPITAL_COMMUNITY): Payer: 59

## 2015-04-10 ENCOUNTER — Encounter (HOSPITAL_COMMUNITY): Payer: 59

## 2015-04-10 ENCOUNTER — Encounter (HOSPITAL_COMMUNITY)
Admission: RE | Admit: 2015-04-10 | Discharge: 2015-04-10 | Disposition: A | Discharge status: Home / Self Care | Payer: 59 | Source: Ambulatory Visit | Attending: Cardiovascular Disease | Admitting: Cardiovascular Disease

## 2015-04-10 DIAGNOSIS — I471 Supraventricular tachycardia: Secondary | ICD-10-CM | POA: Insufficient documentation

## 2015-04-10 DIAGNOSIS — E785 Hyperlipidemia, unspecified: Secondary | ICD-10-CM | POA: Diagnosis not present

## 2015-04-10 DIAGNOSIS — I6340 Cerebral infarction due to embolism of unspecified cerebral artery: Secondary | ICD-10-CM | POA: Diagnosis not present

## 2015-04-10 DIAGNOSIS — I255 Ischemic cardiomyopathy: Secondary | ICD-10-CM | POA: Diagnosis not present

## 2015-04-10 DIAGNOSIS — I5022 Chronic systolic (congestive) heart failure: Secondary | ICD-10-CM | POA: Insufficient documentation

## 2015-04-10 DIAGNOSIS — I2510 Atherosclerotic heart disease of native coronary artery without angina pectoris: Secondary | ICD-10-CM | POA: Diagnosis present

## 2015-04-10 DIAGNOSIS — I10 Essential (primary) hypertension: Secondary | ICD-10-CM | POA: Insufficient documentation

## 2015-04-10 DIAGNOSIS — N182 Chronic kidney disease, stage 2 (mild): Secondary | ICD-10-CM | POA: Insufficient documentation

## 2015-04-12 ENCOUNTER — Encounter (HOSPITAL_COMMUNITY): Payer: 59

## 2015-04-12 ENCOUNTER — Encounter (HOSPITAL_COMMUNITY)
Admission: RE | Admit: 2015-04-12 | Discharge: 2015-04-12 | Disposition: A | Discharge status: Home / Self Care | Payer: 59 | Source: Ambulatory Visit | Attending: Cardiovascular Disease | Admitting: Cardiovascular Disease

## 2015-04-12 DIAGNOSIS — I2510 Atherosclerotic heart disease of native coronary artery without angina pectoris: Secondary | ICD-10-CM | POA: Diagnosis not present

## 2015-04-14 ENCOUNTER — Encounter (HOSPITAL_COMMUNITY): Payer: 59

## 2015-04-14 ENCOUNTER — Encounter (HOSPITAL_COMMUNITY)
Admission: RE | Admit: 2015-04-14 | Discharge: 2015-04-14 | Disposition: A | Discharge status: Home / Self Care | Payer: 59 | Source: Ambulatory Visit | Attending: Cardiovascular Disease | Admitting: Cardiovascular Disease

## 2015-04-14 DIAGNOSIS — I2510 Atherosclerotic heart disease of native coronary artery without angina pectoris: Secondary | ICD-10-CM | POA: Diagnosis not present

## 2015-04-17 ENCOUNTER — Encounter (HOSPITAL_COMMUNITY)
Admission: RE | Admit: 2015-04-17 | Discharge: 2015-04-17 | Disposition: A | Discharge status: Home / Self Care | Payer: 59 | Source: Ambulatory Visit | Attending: Cardiovascular Disease | Admitting: Cardiovascular Disease

## 2015-04-17 ENCOUNTER — Encounter (HOSPITAL_COMMUNITY): Payer: 59

## 2015-04-17 DIAGNOSIS — I2510 Atherosclerotic heart disease of native coronary artery without angina pectoris: Secondary | ICD-10-CM | POA: Diagnosis not present

## 2015-04-19 ENCOUNTER — Encounter (HOSPITAL_COMMUNITY): Payer: 59

## 2015-04-19 ENCOUNTER — Encounter (HOSPITAL_COMMUNITY)
Admission: RE | Admit: 2015-04-19 | Discharge: 2015-04-19 | Disposition: A | Discharge status: Home / Self Care | Payer: 59 | Source: Ambulatory Visit | Attending: Cardiovascular Disease | Admitting: Cardiovascular Disease

## 2015-04-19 DIAGNOSIS — I2510 Atherosclerotic heart disease of native coronary artery without angina pectoris: Secondary | ICD-10-CM | POA: Diagnosis not present

## 2015-04-19 LAB — GLUCOSE, CAPILLARY
GLUCOSE-CAPILLARY: 61 mg/dL — AB (ref 65–99)
Glucose-Capillary: 145 mg/dL — ABNORMAL HIGH (ref 65–99)

## 2015-04-19 NOTE — Progress Notes (Signed)
Pt reported to rehab his home CBG 75.  Pt blood glucose checked - 61. Pt denies any symptoms and was surprised. Pt given lemonade and ginger ale to drink and a banana to eat.  Pt reports that he over treated his blood glucose on yesterday evening  Pt with high cbg reading 286.  Pt admits he had several slices of pie on "[employer]".  He gave himself extra insulin yesterday evening. Pt did not disclose how much extra he gave himself.  Pt awoke with low reading of 49.  Pt drank some juice and ate a banana.  Pt went back to bed and checked his blood glucose this morning around 6 am - 75.  Pt ate some breakfast and came on in for exercise.  Rechecked after 15 minutes 145.  Advised no exercise today.  Pt to go home and eat again with protein for glucose stabilization.  Pt reminded to monitor his blood glucose throughout the day.  Pt long history of diabetes verbalized understanding and is in agreement of this. Alanson Alyarlette Cherina Dhillon RN, BSN

## 2015-04-21 ENCOUNTER — Encounter (HOSPITAL_COMMUNITY): Payer: 59

## 2015-04-21 ENCOUNTER — Encounter (HOSPITAL_COMMUNITY)
Admission: RE | Admit: 2015-04-21 | Discharge: 2015-04-21 | Disposition: A | Discharge status: Home / Self Care | Payer: 59 | Source: Ambulatory Visit | Attending: Cardiovascular Disease | Admitting: Cardiovascular Disease

## 2015-04-21 DIAGNOSIS — I2510 Atherosclerotic heart disease of native coronary artery without angina pectoris: Secondary | ICD-10-CM | POA: Diagnosis not present

## 2015-04-24 ENCOUNTER — Encounter (HOSPITAL_COMMUNITY): Payer: 59

## 2015-04-24 ENCOUNTER — Encounter (HOSPITAL_COMMUNITY)
Admission: RE | Admit: 2015-04-24 | Payer: 59 | Source: Ambulatory Visit | Attending: Cardiovascular Disease | Admitting: Cardiovascular Disease

## 2015-04-26 ENCOUNTER — Encounter (HOSPITAL_COMMUNITY): Payer: 59

## 2015-04-28 ENCOUNTER — Encounter (HOSPITAL_COMMUNITY): Payer: 59

## 2015-04-28 ENCOUNTER — Ambulatory Visit (INDEPENDENT_AMBULATORY_CARE_PROVIDER_SITE_OTHER): Payer: 59 | Admitting: Endocrinology

## 2015-04-28 VITALS — BP 104/60 | HR 65 | Temp 98.2°F | Ht 68.0 in | Wt 174.0 lb

## 2015-04-28 DIAGNOSIS — E1122 Type 2 diabetes mellitus with diabetic chronic kidney disease: Secondary | ICD-10-CM | POA: Diagnosis not present

## 2015-04-28 DIAGNOSIS — Z794 Long term (current) use of insulin: Secondary | ICD-10-CM

## 2015-04-28 DIAGNOSIS — N183 Chronic kidney disease, stage 3 (moderate): Secondary | ICD-10-CM | POA: Diagnosis not present

## 2015-04-28 MED ORDER — INSULIN ASPART 100 UNIT/ML FLEXPEN
10.0000 [IU] | PEN_INJECTOR | Freq: Three times a day (TID) | SUBCUTANEOUS | Status: DC
Start: 2015-04-28 — End: 2015-06-23

## 2015-04-28 MED ORDER — BASAGLAR KWIKPEN 100 UNIT/ML ~~LOC~~ SOPN: 30.0000 [IU] | PEN_INJECTOR | SUBCUTANEOUS | Status: DC

## 2015-04-28 NOTE — Patient Instructions (Addendum)
check your blood sugar 4 times a day--before the 3 meals, and at bedtime.  also check if you have symptoms of your blood sugar being too high or too low.  please keep a record of the readings and bring it to your next appointment here.  please call us sooner if you are having low blood sugar episodes.   Please come back a follow-up appointment in 2 months.     Please change your current insulin to:  Basaglar, 30 units each morning, and humalog, 10 units with the evening meal.

## 2015-04-28 NOTE — Progress Notes (Signed)
Subjective:    Patient ID: John Carrillo, male    DOB: October 05, 1950, 65 y.o.   MRN: [id number]  HPI Pt returns for f/u of diabetes mellitus: DM type: 1 Dx'ed: 2008. Complications: CVA, PAD, renal insufficiency, and CAD. Therapy: insulin since soon after dx. DKA: never Severe hypoglycemia: never Pancreatitis: never Other: he declines pump; he declines multiple daily injections.   Interval history: He seldom has hypoglycemia, and these episodes are mild.  This still usually happens at lunch.  no cbg record, but states cbg's are still highest in am.  pt states he feels well in general.  Past Medical History  Diagnosis Date  . CORONARY ARTERY DISEASE 08/11/2006  . CVA 12/19/2006    2008  . DIABETES MELLITUS, TYPE I 08/11/2006  . ECZEMA, HANDS 12/15/2009  . HYPERLIPIDEMIA 08/11/2006  . HYPERTENSION 08/11/2006  . MYOCARDIAL INFARCTION, HX OF 08/11/2006    07/14/01  . NSTEMI (non-ST elevated myocardial infarction) (HCC) 01/15/2015  . Acute kidney injury superimposed on chronic kidney disease (HCC) 01/25/2015    Past Surgical History  Procedure Laterality Date  . Tonsilectomy, adenoidectomy, bilateral myringotomy and tubes    . Coronary stent placement    . Cardiac catheterization N/A 01/16/2015    Procedure: Left Heart Cath and Coronary Angiography;  Surgeon: [name], MD;  Location: [facility] INVASIVE CV LAB;  Service: Cardiovascular;  Laterality: N/A;  . Tee without cardioversion N/A 01/26/2015    Procedure: TRANSESOPHAGEAL ECHOCARDIOGRAM (TEE);  Surgeon: [name], MD;  Location: [facility] ENDOSCOPY;  Service: Cardiovascular;  Laterality: N/A;    Social History   Social History  . Marital Status: Married    Spouse Name: N/A  . Number of Children: N/A  . Years of Education: N/A   Occupational History  . Writer   Social History Main Topics  . Smoking status: Former Smoker -- 1.00 packs/day for 30 years    Types: Cigarettes    Quit date: 05/03/2000    . Smokeless tobacco: Not on file  . Alcohol Use: 1.8 oz/week    0 Standard drinks or equivalent, 1 Cans of beer, 1 Glasses of wine, 1 Shots of liquor per week     Comment: 1-2/month  . Drug Use: No  . Sexual Activity: Not on file   Other Topics Concern  . Not on file   Social History Narrative   Married (kids are patients here, wife seen elsewhere). 3 step children, 1 biological. No grandkids.    In late 2016:   75 yo sophomore at [employer]- consider age 51 retirement   Oldest daughter - [employer]- social work intl business minor religion      Emergency planning/management officer for [employer]      Hobbies: race sail boats, adrenaline related activities    Current Outpatient Prescriptions on File Prior to Visit  Medication Sig Dispense Refill  . acetaminophen (TYLENOL) 325 MG tablet Take 2 tablets (650 mg total) by mouth every 4 (four) hours as needed for headache or mild pain.    [name] apixaban (ELIQUIS) 5 MG TABS tablet Take 1 tablet (5 mg total) by mouth 2 (two) times daily. 60 tablet 11  . atorvastatin (LIPITOR) 80 MG tablet Take 1 tablet (80 mg total) by mouth daily.    . carvedilol (COREG) 6.25 MG tablet Take 1 tablet (6.25 mg total) by mouth 2 (two) times daily with a meal. 60 tablet 11  . glucose blood (ONETOUCH VERIO) test strip 1 each by  Other route 4 (four) times daily. And lances 4/day E11.9 120 each 3  . Insulin Pen Needle (BD ULTRA-FINE PEN NEEDLES) 29G X 12.7MM MISC use as directed twice a day 200 each 2  . levothyroxine (SYNTHROID, LEVOTHROID) 50 MCG tablet Take 1 tablet (50 mcg total) by mouth daily. 30 tablet 5  . nitroGLYCERIN (NITROSTAT) 0.4 MG SL tablet Place 1 tablet (0.4 mg total) under the tongue every 5 (five) minutes as needed for chest pain. Chest pain 30 tablet 5  . Probiotic Product (PROBIOTIC PO) Take 1 capsule by mouth every morning.    . ramipril (ALTACE) 5 MG capsule Take 1 capsule (5 mg total) by mouth daily. 60 capsule 11  . spironolactone (ALDACTONE) 25 MG  tablet Take 1 tablet (25 mg total) by mouth daily. 30 tablet 11  . ticagrelor (BRILINTA) 90 MG TABS tablet Take 1 tablet (90 mg total) by mouth 2 (two) times daily. 60 tablet 11   No current facility-administered medications on file prior to visit.    No Known Allergies  Family History  Problem Relation Age of Onset  . Diabetes Neg Hx   . CAD Father     age 64  . CAD Paternal Grandfather     5970s  . CAD Paternal Grandmother     3470s  . Brain cancer Maternal Grandmother     BP 104/60 mmHg  Pulse 65  Temp(Src) 98.2 F (36.8 C) (Oral)  Ht 5\' 8"  (1.727 m)  Wt 174 lb (78.926 kg)  BMI 26.46 kg/m2  SpO2 95%  Review of Systems Denies LOC    Objective:   Physical Exam VITAL SIGNS:  See vs page GENERAL: no distress Pulses: dorsalis pedis intact bilat.   MSK: no deformity of the feet CV: no leg edema Skin:  no ulcer on the feet.  normal color and temp on the feet. Neuro: sensation is intact to touch on the feet Ext: there is ecchymosis under the right 2nd toenail   A1c=8.8%  Lab Results  Component Value Date   CREATININE 1.24 03/17/2015   BUN 23 03/17/2015   NA 138 03/17/2015   K 4.2 03/17/2015   CL 103 03/17/2015   CO2 25 03/17/2015      Assessment & Plan:  DM: he needs increased rx.  Patient is advised the following: Patient Instructions  check your blood sugar 4 times a day--before the 3 meals, and at bedtime.  also check if you have symptoms of your blood sugar being too high or too low.  please keep a record of the readings and bring it to your next appointment here.  please call us sooner if you are having low blood sugar episodes.   Please come back a follow-up appointment in 2 months.     Please change your current insulin to:  Basaglar, 30 units each morning, and humalog, 10 units with the evening meal.

## 2015-05-01 ENCOUNTER — Encounter (HOSPITAL_COMMUNITY): Payer: 59

## 2015-05-01 ENCOUNTER — Encounter (HOSPITAL_COMMUNITY)
Admission: RE | Admit: 2015-05-01 | Discharge: 2015-05-01 | Disposition: A | Discharge status: Home / Self Care | Payer: 59 | Source: Ambulatory Visit | Attending: Cardiovascular Disease | Admitting: Cardiovascular Disease

## 2015-05-01 DIAGNOSIS — I2510 Atherosclerotic heart disease of native coronary artery without angina pectoris: Secondary | ICD-10-CM | POA: Diagnosis not present

## 2015-05-02 NOTE — Progress Notes (Signed)
Patient ID: John Carrillo, male   DOB: [birth year], 65 y.o.   MRN: [id number]    Cardiology Office Note    Date:  05/02/2015   ID:  John Carrillo, DOB [birth year], MRN [id number]  PCP:  Tana Conch, MD  Cardiologist:  Dr. Charlton Haws   Electrophysiologist:  Dr. Hillis Range   No chief complaint on file.   History of Present Illness:  John Carrillo is a 65 y.o. male with a hx of CAD status post anterior MI 15 years ago treated with stenting of the LAD, ICM, LBBB, prior CVA, HL, DM 2. I have not seen him in years. Seen by PA 01/31/15  He was evaluated by Dr. [name] in 2008 for ICD implantation for primary prevention.  He declined at that time.    He was admitted 12/10-12/13/16  with a non-STEMI. He presented to the emergency room with SVT with baseline LBBB. He converted to NSR with adenosine. LHC demonstrated patent LAD stent and high-grade RCA disease. This was treated with a DES. EF was severely depressed. By echocardiogram, ejection fraction was 15%. He was seen by Dr. [name] for EP evaluation. LifeVest was not recommended. Optimization of medical therapy was recommended over the next 3 months and then reevaluation of LV function.  Readmitted 12/20-12/22 with sudden vision loss in the left eye. MRI confirmed new right occipital lobe CVA. There was also old left MCA territory CVAs. He was seen by cardiology and EP. He declined ILR implantation. TEE demonstrated severe LV dysfunction EF 20-25%  with thick spontaneous echo contrast in LV. This is a marker for increased risk of thrombus. Anticoagulation was recommended. He was placed on Eliquis. Brilinta was continued. It was recommended that ASA could be stopped as it adds little to Brilinta.    Returns for follow-up. Since DC, he has been doing well. Denies recurrent chest symptoms. Denies significant dyspnea. Denies orthopnea, PND or edema. Denies syncope or near-syncope. Denies fatigue or weakness. He denies any bleeding  issues.  F/U Echo: ordered but not done      Past Medical History  Diagnosis Date  . CORONARY ARTERY DISEASE 08/11/2006  . CVA 12/19/2006    2008  . DIABETES MELLITUS, TYPE I 08/11/2006  . ECZEMA, HANDS 12/15/2009  . HYPERLIPIDEMIA 08/11/2006  . HYPERTENSION 08/11/2006  . MYOCARDIAL INFARCTION, HX OF 08/11/2006    07/14/01  . NSTEMI (non-ST elevated myocardial infarction) (HCC) 01/15/2015  . Acute kidney injury superimposed on chronic kidney disease (HCC) 01/25/2015    Past Surgical History  Procedure Laterality Date  . Tonsilectomy, adenoidectomy, bilateral myringotomy and tubes    . Coronary stent placement    . Cardiac catheterization N/A 01/16/2015    Procedure: Left Heart Cath and Coronary Angiography;  Surgeon: [name], MD;  Location: [facility] INVASIVE CV LAB;  Service: Cardiovascular;  Laterality: N/A;  . Tee without cardioversion N/A 01/26/2015    Procedure: TRANSESOPHAGEAL ECHOCARDIOGRAM (TEE);  Surgeon: [name], MD;  Location[facility] ENDOSCOPY;  Service: Cardiovascular;  Laterality: N/A;    Current Outpatient Prescriptions  Medication Sig Dispense Refill  . acetaminophen (TYLENOL) 325 MG tablet Take 2 tablets (650 mg total) by mouth every 4 (four) hours as needed for headache or mild pain.    [name] apixaban (ELIQUIS) 5 MG TABS tablet Take 1 tablet (5 mg total) by mouth 2 (two) times daily. 60 tablet 11  . atorvastatin (LIPITOR) 80 MG tablet Take 1 tablet (80 mg total) by mouth daily.    [name]  carvedilol (COREG) 6.25 MG tablet Take 1 tablet (6.25 mg total) by mouth 2 (two) times daily with a meal. 60 tablet 11  . Coenzyme Q10 60 MG CAPS Take by mouth.    [name]. glucose blood (ONETOUCH VERIO) test strip 1 each by Other route 4 (four) times daily. And lances 4/day E11.9 120 each 3  . insulin aspart (NOVOLOG FLEXPEN) 100 UNIT/ML FlexPen Inject 10 Units into the skin 3 (three) times daily with meals. And pen needles 2/day 15 mL 11  . Insulin Glargine (BASAGLAR KWIKPEN) 100 UNIT/ML SOPN  Inject 0.3 mLs (30 Units total) into the skin every morning. 5 pen 11  . Insulin Pen Needle (BD ULTRA-FINE PEN NEEDLES) 29G X 12.7MM MISC use as directed twice a day 200 each 2  . levothyroxine (SYNTHROID, LEVOTHROID) 50 MCG tablet Take 1 tablet (50 mcg total) by mouth daily. 30 tablet 5  . nitroGLYCERIN (NITROSTAT) 0.4 MG SL tablet Place 1 tablet (0.4 mg total) under the tongue every 5 (five) minutes as needed for chest pain. Chest pain 30 tablet 5  . Probiotic Product (PROBIOTIC PO) Take 1 capsule by mouth every morning.    . ramipril (ALTACE) 5 MG capsule Take 1 capsule (5 mg total) by mouth daily. 60 capsule 11  . spironolactone (ALDACTONE) 25 MG tablet Take 1 tablet (25 mg total) by mouth daily. 30 tablet 11  . ticagrelor (BRILINTA) 90 MG TABS tablet Take 1 tablet (90 mg total) by mouth 2 (two) times daily. 60 tablet 11   No current facility-administered medications for this visit.    Allergies:   Review of patient's allergies indicates no known allergies.   Social History   Social History  . Marital Status: Married    Spouse Name: N/A  . Number of Children: N/A  . Years of Education: N/A   Occupational History  . Writerroduction Manager [employer]   Social History Main Topics  . Smoking status: Former Smoker -- 1.00 packs/day for 30 years    Types: Cigarettes    Quit date: 05/03/2000  . Smokeless tobacco: Not on file  . Alcohol Use: 1.8 oz/week    0 Standard drinks or equivalent, 1 Cans of beer, 1 Glasses of wine, 1 Shots of liquor per week     Comment: 1-2/month  . Drug Use: No  . Sexual Activity: Not on file   Other Topics Concern  . Not on file   Social History Narrative   Married (kids are patients here, wife seen elsewhere). 3 step children, 1 biological. No grandkids.    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 There is no history of Diabetes.   ROS:   Please see the history of present illness.    Review of Systems  Cardiovascular: Positive for chest pain.  Respiratory: Positive for shortness of breath.   All other systems reviewed and are negative.   PHYSICAL EXAM:   VS:  There were no vitals taken for this visit.   GEN: Well nourished, well developed, in no acute distress HEENT: normal Neck: no JVD, no masses Cardiac:  Normal S1/S2,  RRR; no murmurs, rubs, or gallops, no edema;  right wrist without hematoma or mass    Respiratory:  clear to auscultation bilaterally; no wheezing, rhonchi or rales GI: soft, nontender, nondistended, + BS MS: no deformity or atrophy Skin: warm and dry, no rash Neuro:  Bilateral strength equal, no focal deficits  Psych: Alert and oriented x 3, normal affect  Wt Readings from Last 3 Encounters:  04/28/15 78.926 kg (174 lb)  04/04/15 80.105 kg (176 lb 9.6 oz)  03/03/15 78.472 kg (173 lb)      Studies/Labs Reviewed:   EKG:  EKG is  ordered today.  The ekg ordered today demonstrates sinus bradycardia, HR 55, LBBB, first-degree AV block (PR 212 ms), no significant change when compared to prior tracings  Recent Labs: 02/17/2015: TSH 12.19* 03/17/2015: ALT 26; BUN 23; Creat 1.24; Hemoglobin 14.2; Platelets 198; Potassium 4.2; Sodium 138   Recent Lipid Panel    Component Value Date/Time   CHOL 177 03/17/2015 0822   TRIG 102 03/17/2015 0822   HDL 52 03/17/2015 0822   CHOLHDL 3.4 03/17/2015 0822   VLDL 20 03/17/2015 0822   LDLCALC 105 03/17/2015 0822   LDLDIRECT 90.0 02/17/2015 0816    Additional studies/ records that were reviewed today include:   TEE 01/26/15 EF 20-25%, anteroseptal/anterior/apical akinesis,  grade 2 diastolic dysfunction, moderate apical spontaneous echo contrast indicative of stasis, mild MR, mild LAE with mild intermittent spontaneous echo contrast in the LAA  [facility] 01/16/15 LAD proximal stent patent RCA mid 95% EF 25% PCI: 3.5 x 20 mm Promus premier DES to the mid RCA     Echo 01/16/15 Septal and apical akinesis, diffuse HK, EF 15%, mild MR, mild LAE, mild RAE, PASP 59 mmHg, moderate TR   ASSESSMENT:    Ischemic DCM   PLAN:  In order of problems listed above:  1. CAD - s/p recent non-STEMI treated with a DES to the RCA. He is no longer on aspirin as he is on Eliquis and Brilinta. We discussed the importance of remaining on Brilinta for 12 months. Continue Brilinta, statin, beta blocker, ACE inhibitor. Refer to cardiac rehabilitation.  2. Ischemic CM - Continue beta blocker, ACE inhibitor, spironolactone.  Plan follow-up echocardiogram 90 days post PCI. If EF remains <35%, refer back to Dr. [name] for consideration of ICD.  3. Chronic Systolic HF - Volume stable. He is NYHA 2. Continue current therapy.  4. S/p Embolic CVA - As noted, TEE with thick spontaneous echo contrast in LV which is a high risk marker for thromboembolism.  He is now on Eliquis. Echo/MRI to reevaluate EF refer to [facility] for BiV AICD   5. HTN - Blood pressure running low. He is asymptomatic. As noted, I will decrease his ramipril to 5 mg daily.  6. HL -  Continue statin. Obtain follow-up lipids and LFTs in 6 weeks.  7. SVT - Continue beta blocker.  8. CKD -  Lab Results  Component Value Date   CREATININE 1.24 03/17/2015   BUN 23 03/17/2015   NA 138 03/17/2015   K 4.2 03/17/2015   CL 103 03/17/2015   CO2 25 03/17/2015      Charlton Haws  This encounter was created in error - please disregard.

## 2015-05-03 ENCOUNTER — Encounter (HOSPITAL_COMMUNITY): Payer: 59

## 2015-05-05 ENCOUNTER — Encounter (HOSPITAL_COMMUNITY): Payer: 59

## 2015-05-05 ENCOUNTER — Other Ambulatory Visit (HOSPITAL_COMMUNITY): Payer: 59

## 2015-05-05 ENCOUNTER — Encounter: Payer: 59 | Admitting: Cardiovascular Disease

## 2015-05-05 ENCOUNTER — Encounter (HOSPITAL_COMMUNITY)
Admission: RE | Admit: 2015-05-05 | Payer: 59 | Source: Ambulatory Visit | Attending: Cardiovascular Disease | Admitting: Cardiovascular Disease

## 2015-05-08 ENCOUNTER — Encounter (HOSPITAL_COMMUNITY)
Admission: RE | Admit: 2015-05-08 | Discharge: 2015-05-08 | Disposition: A | Discharge status: Home / Self Care | Payer: 59 | Source: Ambulatory Visit | Attending: Cardiovascular Disease | Admitting: Cardiovascular Disease

## 2015-05-08 ENCOUNTER — Encounter (HOSPITAL_COMMUNITY): Payer: 59

## 2015-05-08 DIAGNOSIS — N182 Chronic kidney disease, stage 2 (mild): Secondary | ICD-10-CM | POA: Insufficient documentation

## 2015-05-08 DIAGNOSIS — I10 Essential (primary) hypertension: Secondary | ICD-10-CM | POA: Insufficient documentation

## 2015-05-08 DIAGNOSIS — I255 Ischemic cardiomyopathy: Secondary | ICD-10-CM | POA: Insufficient documentation

## 2015-05-08 DIAGNOSIS — I2510 Atherosclerotic heart disease of native coronary artery without angina pectoris: Secondary | ICD-10-CM | POA: Insufficient documentation

## 2015-05-08 DIAGNOSIS — E785 Hyperlipidemia, unspecified: Secondary | ICD-10-CM | POA: Diagnosis not present

## 2015-05-08 DIAGNOSIS — I5022 Chronic systolic (congestive) heart failure: Secondary | ICD-10-CM | POA: Diagnosis not present

## 2015-05-08 DIAGNOSIS — I6340 Cerebral infarction due to embolism of unspecified cerebral artery: Secondary | ICD-10-CM | POA: Insufficient documentation

## 2015-05-08 DIAGNOSIS — I471 Supraventricular tachycardia: Secondary | ICD-10-CM | POA: Insufficient documentation

## 2015-05-08 NOTE — Progress Notes (Signed)
  Psychosocial followup assessment Patient psychosocial assessment reveals no barriers to cardiac rehab.  Patient  continues to exhibit  Positive coping skills and good support system.  Patient  Feels he is making progress towards cardiac rehab goals.  Patient reports health and activity level continue to improve.  Pt has good attendance to exercise.  Pt interacts with fellow participants.  Continue to monitor. Alanson Alyarlette Hershey Knauer RN, BSN

## 2015-05-10 ENCOUNTER — Encounter (HOSPITAL_COMMUNITY): Payer: 59

## 2015-05-10 ENCOUNTER — Encounter (HOSPITAL_COMMUNITY)
Admission: RE | Admit: 2015-05-10 | Discharge: 2015-05-10 | Disposition: A | Discharge status: Home / Self Care | Payer: 59 | Source: Ambulatory Visit | Attending: Cardiovascular Disease | Admitting: Cardiovascular Disease

## 2015-05-10 DIAGNOSIS — I2510 Atherosclerotic heart disease of native coronary artery without angina pectoris: Secondary | ICD-10-CM | POA: Diagnosis not present

## 2015-05-12 ENCOUNTER — Other Ambulatory Visit (HOSPITAL_COMMUNITY): Payer: 59

## 2015-05-12 ENCOUNTER — Encounter (HOSPITAL_COMMUNITY): Payer: 59

## 2015-05-12 ENCOUNTER — Encounter (HOSPITAL_COMMUNITY)
Admission: RE | Admit: 2015-05-12 | Discharge: 2015-05-12 | Disposition: A | Discharge status: Home / Self Care | Payer: 59 | Source: Ambulatory Visit | Attending: Cardiovascular Disease | Admitting: Cardiovascular Disease

## 2015-05-12 DIAGNOSIS — I2510 Atherosclerotic heart disease of native coronary artery without angina pectoris: Secondary | ICD-10-CM | POA: Diagnosis not present

## 2015-05-12 LAB — GLUCOSE, CAPILLARY: Glucose-Capillary: 113 mg/dL — ABNORMAL HIGH (ref 65–99)

## 2015-05-15 ENCOUNTER — Encounter (HOSPITAL_COMMUNITY): Payer: 59

## 2015-05-15 ENCOUNTER — Encounter (HOSPITAL_COMMUNITY)
Admission: RE | Admit: 2015-05-15 | Discharge: 2015-05-15 | Disposition: A | Discharge status: Home / Self Care | Payer: 59 | Source: Ambulatory Visit | Attending: Cardiovascular Disease | Admitting: Cardiovascular Disease

## 2015-05-15 DIAGNOSIS — I2510 Atherosclerotic heart disease of native coronary artery without angina pectoris: Secondary | ICD-10-CM | POA: Diagnosis not present

## 2015-05-17 ENCOUNTER — Encounter (HOSPITAL_COMMUNITY): Payer: 59

## 2015-05-17 ENCOUNTER — Encounter (HOSPITAL_COMMUNITY)
Admission: RE | Admit: 2015-05-17 | Discharge: 2015-05-17 | Disposition: A | Discharge status: Home / Self Care | Payer: 59 | Source: Ambulatory Visit | Attending: Cardiovascular Disease | Admitting: Cardiovascular Disease

## 2015-05-17 DIAGNOSIS — I2510 Atherosclerotic heart disease of native coronary artery without angina pectoris: Secondary | ICD-10-CM | POA: Diagnosis not present

## 2015-05-17 LAB — GLUCOSE, CAPILLARY: GLUCOSE-CAPILLARY: 227 mg/dL — AB (ref 65–99)

## 2015-05-19 ENCOUNTER — Encounter (HOSPITAL_COMMUNITY)
Admission: RE | Admit: 2015-05-19 | Discharge: 2015-05-19 | Disposition: A | Discharge status: Home / Self Care | Payer: 59 | Source: Ambulatory Visit | Attending: Cardiovascular Disease | Admitting: Cardiovascular Disease

## 2015-05-19 ENCOUNTER — Encounter (HOSPITAL_COMMUNITY): Payer: 59

## 2015-05-19 DIAGNOSIS — I2510 Atherosclerotic heart disease of native coronary artery without angina pectoris: Secondary | ICD-10-CM | POA: Diagnosis not present

## 2015-05-22 ENCOUNTER — Encounter (HOSPITAL_COMMUNITY)
Admission: RE | Admit: 2015-05-22 | Discharge: 2015-05-22 | Disposition: A | Discharge status: Home / Self Care | Payer: 59 | Source: Ambulatory Visit | Attending: Cardiovascular Disease | Admitting: Cardiovascular Disease

## 2015-05-22 ENCOUNTER — Encounter (HOSPITAL_COMMUNITY): Payer: 59

## 2015-05-22 DIAGNOSIS — I2510 Atherosclerotic heart disease of native coronary artery without angina pectoris: Secondary | ICD-10-CM | POA: Diagnosis not present

## 2015-05-24 ENCOUNTER — Telehealth: Payer: Self-pay | Admitting: Family Medicine

## 2015-05-24 ENCOUNTER — Encounter (HOSPITAL_COMMUNITY): Payer: 59

## 2015-05-24 ENCOUNTER — Encounter (HOSPITAL_COMMUNITY)
Admission: RE | Admit: 2015-05-24 | Discharge: 2015-05-24 | Disposition: A | Discharge status: Home / Self Care | Payer: 59 | Source: Ambulatory Visit | Attending: Cardiovascular Disease | Admitting: Cardiovascular Disease

## 2015-05-24 DIAGNOSIS — I2510 Atherosclerotic heart disease of native coronary artery without angina pectoris: Secondary | ICD-10-CM

## 2015-05-24 DIAGNOSIS — E785 Hyperlipidemia, unspecified: Secondary | ICD-10-CM

## 2015-05-24 DIAGNOSIS — Z9861 Coronary angioplasty status: Secondary | ICD-10-CM

## 2015-05-24 NOTE — Telephone Encounter (Signed)
He should be taking atorvastatin 80mg . I see an rx for this in his list. This is what he should be on- send this in if needed.

## 2015-05-24 NOTE — Telephone Encounter (Signed)
Pt need new Rx for Lipitor.  Pt stated that he was told on 02/08/2015 to take two lipitor and now he is out and the pharmacy will not refill.  [facility]

## 2015-05-24 NOTE — Telephone Encounter (Signed)
See below I dont see where you documented for pt to take 2 lipitor.

## 2015-05-25 MED ORDER — ATORVASTATIN CALCIUM 80 MG PO TABS: 80.0000 mg | ORAL_TABLET | Freq: Every day | ORAL | Status: DC

## 2015-05-25 NOTE — Telephone Encounter (Signed)
Rx refilled.

## 2015-05-26 ENCOUNTER — Encounter (HOSPITAL_COMMUNITY): Payer: 59

## 2015-05-29 ENCOUNTER — Encounter (HOSPITAL_COMMUNITY): Payer: 59

## 2015-05-29 ENCOUNTER — Encounter (HOSPITAL_COMMUNITY)
Admission: RE | Admit: 2015-05-29 | Discharge: 2015-05-29 | Disposition: A | Discharge status: Home / Self Care | Payer: 59 | Source: Ambulatory Visit | Attending: Cardiovascular Disease | Admitting: Cardiovascular Disease

## 2015-05-29 DIAGNOSIS — I2510 Atherosclerotic heart disease of native coronary artery without angina pectoris: Secondary | ICD-10-CM | POA: Diagnosis not present

## 2015-05-31 ENCOUNTER — Encounter (HOSPITAL_COMMUNITY): Payer: 59

## 2015-05-31 ENCOUNTER — Encounter (HOSPITAL_COMMUNITY)
Admission: RE | Admit: 2015-05-31 | Discharge: 2015-05-31 | Disposition: A | Discharge status: Home / Self Care | Payer: 59 | Source: Ambulatory Visit | Attending: Cardiovascular Disease | Admitting: Cardiovascular Disease

## 2015-05-31 DIAGNOSIS — I2510 Atherosclerotic heart disease of native coronary artery without angina pectoris: Secondary | ICD-10-CM | POA: Diagnosis not present

## 2015-06-02 ENCOUNTER — Encounter (HOSPITAL_COMMUNITY)
Admission: RE | Admit: 2015-06-02 | Discharge: 2015-06-02 | Disposition: A | Discharge status: Home / Self Care | Payer: 59 | Source: Ambulatory Visit | Attending: Cardiovascular Disease | Admitting: Cardiovascular Disease

## 2015-06-02 ENCOUNTER — Encounter (HOSPITAL_COMMUNITY): Payer: 59

## 2015-06-02 DIAGNOSIS — I2510 Atherosclerotic heart disease of native coronary artery without angina pectoris: Secondary | ICD-10-CM | POA: Diagnosis not present

## 2015-06-05 ENCOUNTER — Encounter (HOSPITAL_COMMUNITY)
Admission: RE | Admit: 2015-06-05 | Discharge: 2015-06-05 | Disposition: A | Discharge status: Home / Self Care | Payer: 59 | Source: Ambulatory Visit | Attending: Cardiovascular Disease | Admitting: Cardiovascular Disease

## 2015-06-05 DIAGNOSIS — E785 Hyperlipidemia, unspecified: Secondary | ICD-10-CM | POA: Diagnosis not present

## 2015-06-05 DIAGNOSIS — I2510 Atherosclerotic heart disease of native coronary artery without angina pectoris: Secondary | ICD-10-CM | POA: Diagnosis present

## 2015-06-05 DIAGNOSIS — I471 Supraventricular tachycardia: Secondary | ICD-10-CM | POA: Diagnosis not present

## 2015-06-05 DIAGNOSIS — I10 Essential (primary) hypertension: Secondary | ICD-10-CM | POA: Insufficient documentation

## 2015-06-05 DIAGNOSIS — N182 Chronic kidney disease, stage 2 (mild): Secondary | ICD-10-CM | POA: Insufficient documentation

## 2015-06-05 DIAGNOSIS — I255 Ischemic cardiomyopathy: Secondary | ICD-10-CM | POA: Diagnosis not present

## 2015-06-05 DIAGNOSIS — I5022 Chronic systolic (congestive) heart failure: Secondary | ICD-10-CM | POA: Insufficient documentation

## 2015-06-05 DIAGNOSIS — I6340 Cerebral infarction due to embolism of unspecified cerebral artery: Secondary | ICD-10-CM | POA: Insufficient documentation

## 2015-06-07 ENCOUNTER — Encounter (HOSPITAL_COMMUNITY)
Admission: RE | Admit: 2015-06-07 | Discharge: 2015-06-07 | Disposition: A | Discharge status: Home / Self Care | Payer: 59 | Source: Ambulatory Visit | Attending: Cardiovascular Disease | Admitting: Cardiovascular Disease

## 2015-06-07 DIAGNOSIS — I2510 Atherosclerotic heart disease of native coronary artery without angina pectoris: Secondary | ICD-10-CM | POA: Diagnosis not present

## 2015-06-09 ENCOUNTER — Encounter (HOSPITAL_COMMUNITY)
Admission: RE | Admit: 2015-06-09 | Discharge: 2015-06-09 | Disposition: A | Discharge status: Home / Self Care | Payer: 59 | Source: Ambulatory Visit | Attending: Cardiovascular Disease | Admitting: Cardiovascular Disease

## 2015-06-09 DIAGNOSIS — I2510 Atherosclerotic heart disease of native coronary artery without angina pectoris: Secondary | ICD-10-CM | POA: Diagnosis not present

## 2015-06-12 ENCOUNTER — Encounter (HOSPITAL_COMMUNITY)
Admission: RE | Admit: 2015-06-12 | Discharge: 2015-06-12 | Disposition: A | Discharge status: Home / Self Care | Payer: 59 | Source: Ambulatory Visit | Attending: Cardiovascular Disease | Admitting: Cardiovascular Disease

## 2015-06-12 DIAGNOSIS — I2510 Atherosclerotic heart disease of native coronary artery without angina pectoris: Secondary | ICD-10-CM | POA: Diagnosis not present

## 2015-06-13 ENCOUNTER — Telehealth: Payer: Self-pay | Admitting: *Deleted

## 2015-06-13 NOTE — Telephone Encounter (Signed)
Informed patient that he was fine to take his medications before his echo. Patient verbalized understanding.

## 2015-06-13 NOTE — Telephone Encounter (Signed)
Patient called and would like to know what the protocol is for taking his meds Friday before his echo.  He can be reached at [phone]. Thanks, MI

## 2015-06-14 ENCOUNTER — Encounter (HOSPITAL_COMMUNITY)
Admission: RE | Admit: 2015-06-14 | Discharge: 2015-06-14 | Disposition: A | Discharge status: Home / Self Care | Payer: 59 | Source: Ambulatory Visit | Attending: Cardiovascular Disease | Admitting: Cardiovascular Disease

## 2015-06-14 DIAGNOSIS — I2510 Atherosclerotic heart disease of native coronary artery without angina pectoris: Secondary | ICD-10-CM | POA: Diagnosis not present

## 2015-06-14 NOTE — Progress Notes (Signed)
Pt graduated from cardiac rehab program today with completion of 35 exercise sessions in Phase II. Pt due to graduate on Friday but has echo scheduled and opted to grad today verses returning on Monday for one session.  Pt maintained good attendance to exercise but unfortunately pt could not attend education class due to his work schedule. Pt progressed nicely during his participation in rehab as evidenced by increased MET level.  Pt MET level increased from 3.8 to 6.1!!  Weights were added to pt routine with no issues.  Vital signs - BP remained soft. Pt given rehab report to given to his cardiologist on his appt this Friday.  Pt is hopeful that due to some symptoms he is having his physician decreased his bp meds.Medication list reconciled. Pt splits his 30 units between two doses 15 units in the morning and 15 units in the evening for total 30 units. Repeat  PHQ score-0  .  Pt has made significant lifestyle changes and should be commended for his success. Pt feels he has achieved his goals during cardiac rehab. Pt has increased strength and muscle tone  Pt plans to continue exercise [facility] three times week treadmill, stationary bikes and weights.  Pt plans to walk the other days.  Pt plans to average one to one and half hours each session.   It was a delight to have this pt in the program. Maurice Small RN, BSN

## 2015-06-15 NOTE — Progress Notes (Signed)
Patient ID: John Carrillo, male   DOB: 09-Oct-1950, 65 y.o.   MRN: [id number]    Cardiology Office Note    Date:  06/16/2015   ID:  John Carrillo, DOB [birth year], MRN [id number]  PCP:  Tana Conch, MD  Cardiologist:  Dr. Charlton Haws   Electrophysiologist:  Dr. Hillis Range   Chief Complaint  Patient presents with  . Coronary Artery Disease    feeling some fatigue with the BP meds, per pt  . Cardiomyopathy    History of Present Illness:  John Carrillo is a 65 y.o. male with a hx of CAD status post anterior MI 15 years ago treated with stenting of the LAD, ICM, LBBB, prior CVA, HL, DM 2.  He was evaluated by Dr. [name] in 2008 for ICD implantation for primary prevention.  He declined at that time.    He was admitted 12/10-12/13 /16 with a non-STEMI. He presented to the emergency room with SVT with baseline LBBB. He converted to NSR with adenosine. LHC demonstrated patent LAD stent and high-grade RCA disease. This was treated with a DES. EF was severely depressed. By echocardiogram, ejection fraction was 15%. He was seen by Dr. [name] for EP evaluation. LifeVest was not recommended. Optimization of medical therapy was recommended over the next 3 months and then reevaluation of LV function.  Readmitted 12/20-12/22 with sudden vision loss in the left eye. MRI confirmed new right occipital lobe CVA. There was also old left MCA territory CVAs. He was seen by cardiology and EP. He declined ILR implantation. TEE demonstrated severe LV dysfunction with thick spontaneous echo contrast in LV. This is a marker for increased risk of thrombus. Anticoagulation was recommended. He was placed on Eliquis. Brilinta was continued. It was recommended that ASA could be stopped as it adds little to Brilinta.    Returns for follow-up. Since DC, he has been doing well. Denies recurrent chest symptoms. Denies significant dyspnea. Denies orthopnea, PND or edema. Denies syncope or near-syncope. Denies  fatigue or weakness. He denies any bleeding issues.  Echo reviewed from today which is 5 months post intervention EF still 20-25% mild MR diastolic parameters ok  Long discussion with patient about BiV AICD.  ECG with LBBB and first degree.  Only issue is wether inferior / or posterior lateral wall has viability for left ventricular Pacing through coronary sinus. He wants to resume sailing / racing He indicates yacht clubs have AED;s on sight.  Willing to see EP for evaluation  Feels wiped out. No edema. Not clear if this is from low output or over medication. BP low told him we could lower aldactone   Past Medical History  Diagnosis Date  . CORONARY ARTERY DISEASE 08/11/2006  . CVA 12/19/2006    2008  . DIABETES MELLITUS, TYPE I 08/11/2006  . ECZEMA, HANDS 12/15/2009  . HYPERLIPIDEMIA 08/11/2006  . HYPERTENSION 08/11/2006  . MYOCARDIAL INFARCTION, HX OF 08/11/2006    07/14/01  . NSTEMI (non-ST elevated myocardial infarction) (HCC) 01/15/2015  . Acute kidney injury superimposed on chronic kidney disease (HCC) 01/25/2015    Past Surgical History  Procedure Laterality Date  . Tonsilectomy, adenoidectomy, bilateral myringotomy and tubes    . Coronary stent placement    . Cardiac catheterization N/A 01/16/2015    Procedure: Left Heart Cath and Coronary Angiography;  Surgeon: [name], MD;  Location: [facility] INVASIVE CV LAB;  Service: Cardiovascular;  Laterality: N/A;  . Tee without cardioversion N/A 01/26/2015  Procedure: TRANSESOPHAGEAL ECHOCARDIOGRAM (TEE);  Surgeon: [name], MD;  Location: [facility] ENDOSCOPY;  Service: Cardiovascular;  Laterality: N/A;    Current Outpatient Prescriptions  Medication Sig Dispense Refill  . acetaminophen (TYLENOL) 325 MG tablet Take 2 tablets (650 mg total) by mouth every 4 (four) hours as needed for headache or mild pain.    [name] apixaban (ELIQUIS) 5 MG TABS tablet Take 1 tablet (5 mg total) by mouth 2 (two) times daily. 60 tablet 11  . atorvastatin  (LIPITOR) 80 MG tablet Take 1 tablet (80 mg total) by mouth daily. 30 tablet 5  . carvedilol (COREG) 6.25 MG tablet Take 1 tablet (6.25 mg total) by mouth 2 (two) times daily with a meal. 60 tablet 11  . Coenzyme Q10 60 MG CAPS Take 1 capsule by mouth daily.     . insulin aspart (NOVOLOG FLEXPEN) 100 UNIT/ML FlexPen Inject 10 Units into the skin 3 (three) times daily with meals. And pen needles 2/day 15 mL 11  . Insulin Glargine (BASAGLAR KWIKPEN) 100 UNIT/ML SOPN Inject 0.3 mLs (30 Units total) into the skin every morning. 5 pen 11  . levothyroxine (SYNTHROID, LEVOTHROID) 50 MCG tablet Take 1 tablet (50 mcg total) by mouth daily. 30 tablet 5  . nitroGLYCERIN (NITROSTAT) 0.4 MG SL tablet Place 1 tablet (0.4 mg total) under the tongue every 5 (five) minutes as needed for chest pain. Chest pain 30 tablet 5  . Probiotic Product (PROBIOTIC PO) Take 1 capsule by mouth every morning.    . ramipril (ALTACE) 5 MG capsule Take 1 capsule (5 mg total) by mouth daily. 60 capsule 11  . spironolactone (ALDACTONE) 25 MG tablet Take 0.5 tablets (12.5 mg total) by mouth daily. 45 tablet 11  . ticagrelor (BRILINTA) 90 MG TABS tablet Take 1 tablet (90 mg total) by mouth 2 (two) times daily. 60 tablet 11   No current facility-administered medications for this visit.    Allergies:   Review of patient's allergies indicates no known allergies.   Social History   Social History  . Marital Status: Married    Spouse Name: N/A  . Number of Children: N/A  . Years of Education: N/A   Occupational History  . Writer   Social History Main Topics  . Smoking status: Former Smoker -- 1.00 packs/day for 30 years    Types: Cigarettes    Quit date: 05/03/2000  . Smokeless tobacco: None  . Alcohol Use: 1.8 oz/week    0 Standard drinks or equivalent, 1 Cans of beer, 1 Glasses of wine, 1 Shots of liquor per week     Comment: 1-2/month  . Drug Use: No  . Sexual Activity: Not Asked   Other  Topics Concern  . None   Social History Narrative   Married (kids are patients here, wife seen elsewhere). 3 step children, 1 biological. No grandkids.    In late 2016:   62 yo sophomore at [employer]- consider age 67 retirement   Oldest daughter - [employer]- social work intl business minor religion      Emergency planning/management officer for [employer]      Hobbies: race sail boats, adrenaline related activities     Family History:  The patient's family history includes Brain cancer in his maternal grandmother; CAD in his father, paternal grandfather, and paternal grandmother. There is no history of Diabetes.   ROS:   Please see the history of present illness.    Review of Systems  Cardiovascular:  Positive for chest pain.  Respiratory: Positive for shortness of breath.   All other systems reviewed and are negative.   PHYSICAL EXAM:   VS:  BP 100/52 mmHg  Pulse 56  Ht  (1.753 m)  Wt 83.28 kg (183 lb 9.6 oz)  BMI 27.10 kg/m2  SpO2 95%   GEN: Well nourished, well developed, in no acute distress HEENT: normal Neck: no JVD, no masses Cardiac: Normal S1/S2,  RRR; no murmurs, rubs, or gallops, no edema;  right wrist without hematoma or mass    Respiratory:  clear to auscultation bilaterally; no wheezing, rhonchi or rales GI: soft, nontender, nondistended, + BS MS: no deformity or atrophy Skin: warm and dry, no rash Neuro:  Bilateral strength equal, no focal deficits  Psych: Alert and oriented x 3, normal affect  Wt Readings from Last 3 Encounters:  06/16/15 83.28 kg (183 lb 9.6 oz)  04/28/15 78.926 kg (174 lb)  04/04/15 80.105 kg (176 lb 9.6 oz)      Studies/Labs Reviewed:   EKG:  EKG is  ordered today.  The ekg ordered today demonstrates sinus bradycardia, HR 55, LBBB, first-degree AV block (PR 212 ms), no significant change when compared to prior tracings  Recent Labs: 02/17/2015: TSH 12.19* 03/17/2015: ALT 26; BUN 23; Creat 1.24; Hemoglobin 14.2; Platelets 198;  Potassium 4.2; Sodium 138   Recent Lipid Panel    Component Value Date/Time   CHOL 177 03/17/2015 0822   TRIG 102 03/17/2015 0822   HDL 52 03/17/2015 0822   CHOLHDL 3.4 03/17/2015 0822   VLDL 20 03/17/2015 0822   LDLCALC 105 03/17/2015 0822   LDLDIRECT 90.0 02/17/2015 0816    Additional studies/ records that were reviewed today include:   TEE 01/26/15 EF 20-25%, anteroseptal/anterior/apical akinesis, grade 2 diastolic dysfunction, moderate apical spontaneous echo contrast indicative of stasis, mild MR, mild LAE with mild intermittent spontaneous echo contrast in the LAA  [facility] 01/16/15 LAD proximal stent patent RCA mid 95% EF 25% PCI: 3.5 x 20 mm Promus premier DES to the mid RCA     Echo 01/16/15 Septal and apical akinesis, diffuse HK, EF 15%, mild MR, mild LAE, mild RAE, PASP 59 mmHg, moderate TR   ASSESSMENT:    1. Cardiomyopathy, ischemic   2. CAD S/P LAD PCI'03, RCA DES 01/16/15     PLAN:  In order of problems listed above:  1. CAD - s/p  non-STEMI treated with a DES to the RCA. He is no longer on aspirin as he is on Eliquis and Brilinta. We discussed the importance of remaining on Brilinta for 12 months. Continue Brilinta, statin, beta blocker, ACE inhibitor. Continue cardiac rehabilitation.  2. Ischemic CM - Continue beta blocker, ACE inhibitor, decrease aldactone.  Refer to EP for BiV AICD    3. Chronic Systolic HF - Volume stable. Decrease aldactone   4. S/p Embolic CVA - As noted, TEE with thick spontaneous echo contrast in LV which is a high risk marker for thromboembolism.  He is now on Eliquis. With AICD can internally monitor for PAF.  5. HTN - Blood pressure running low. He is asymptomatic. As noted, I will decrease his ramipril to 5 mg daily.  6. HL -  Continue statin. Obtain follow-up lipids and LFTs in 6 weeks.  7. SVT - Continue beta blocker.  8. CKD -  Lab Results  Component Value Date   CREATININE 1.24 03/17/2015   BUN 23 03/17/2015   NA  138 03/17/2015   K  4.2 03/17/2015   CL 103 03/17/2015   CO2 25 03/17/2015       Charlton HawsPeter Glady Ouderkirk

## 2015-06-16 ENCOUNTER — Encounter: Payer: Self-pay | Admitting: Cardiovascular Disease

## 2015-06-16 ENCOUNTER — Encounter (HOSPITAL_COMMUNITY): Payer: 59

## 2015-06-16 ENCOUNTER — Ambulatory Visit (INDEPENDENT_AMBULATORY_CARE_PROVIDER_SITE_OTHER): Payer: 59 | Admitting: Cardiovascular Disease

## 2015-06-16 ENCOUNTER — Ambulatory Visit (HOSPITAL_COMMUNITY): Payer: 59 | Attending: Cardiovascular Disease

## 2015-06-16 ENCOUNTER — Other Ambulatory Visit: Payer: Self-pay

## 2015-06-16 VITALS — BP 100/52 | HR 56 | Ht 69.0 in | Wt 183.6 lb

## 2015-06-16 DIAGNOSIS — N182 Chronic kidney disease, stage 2 (mild): Secondary | ICD-10-CM | POA: Insufficient documentation

## 2015-06-16 DIAGNOSIS — I130 Hypertensive heart and chronic kidney disease with heart failure and stage 1 through stage 4 chronic kidney disease, or unspecified chronic kidney disease: Secondary | ICD-10-CM | POA: Diagnosis not present

## 2015-06-16 DIAGNOSIS — I255 Ischemic cardiomyopathy: Secondary | ICD-10-CM | POA: Diagnosis not present

## 2015-06-16 DIAGNOSIS — I6340 Cerebral infarction due to embolism of unspecified cerebral artery: Secondary | ICD-10-CM | POA: Insufficient documentation

## 2015-06-16 DIAGNOSIS — R29898 Other symptoms and signs involving the musculoskeletal system: Secondary | ICD-10-CM | POA: Diagnosis not present

## 2015-06-16 DIAGNOSIS — I5022 Chronic systolic (congestive) heart failure: Secondary | ICD-10-CM | POA: Diagnosis not present

## 2015-06-16 DIAGNOSIS — I10 Essential (primary) hypertension: Secondary | ICD-10-CM

## 2015-06-16 DIAGNOSIS — I2510 Atherosclerotic heart disease of native coronary artery without angina pectoris: Secondary | ICD-10-CM

## 2015-06-16 DIAGNOSIS — E785 Hyperlipidemia, unspecified: Secondary | ICD-10-CM | POA: Insufficient documentation

## 2015-06-16 DIAGNOSIS — I340 Nonrheumatic mitral (valve) insufficiency: Secondary | ICD-10-CM | POA: Diagnosis not present

## 2015-06-16 DIAGNOSIS — Z9861 Coronary angioplasty status: Secondary | ICD-10-CM | POA: Diagnosis not present

## 2015-06-16 DIAGNOSIS — E1122 Type 2 diabetes mellitus with diabetic chronic kidney disease: Secondary | ICD-10-CM | POA: Diagnosis not present

## 2015-06-16 DIAGNOSIS — I471 Supraventricular tachycardia: Secondary | ICD-10-CM

## 2015-06-16 MED ORDER — SPIRONOLACTONE 25 MG PO TABS: 12.5000 mg | ORAL_TABLET | Freq: Every day | ORAL | Status: DC

## 2015-06-16 NOTE — Patient Instructions (Addendum)
Medication Instructions:  Decrease Aldactone to 12.5 mg daily  Labwork: None  Testing/Procedures: None  Follow-Up: Your physician recommends that you schedule a follow-up appointment in: 3 months. ASAP with EP for Defibrillator.     If you need a refill on your cardiac medications before your next appointment, please call your pharmacy.

## 2015-06-19 ENCOUNTER — Encounter (HOSPITAL_COMMUNITY): Payer: 59

## 2015-06-20 ENCOUNTER — Telehealth: Payer: Self-pay | Admitting: Cardiovascular Disease

## 2015-06-20 NOTE — Telephone Encounter (Signed)
NewMessage  Pt wanted to know if he can have labs done @ Dr Everardo AllEllison- 5/19. Please call back and discuss.

## 2015-06-20 NOTE — Telephone Encounter (Signed)
Informed patient he could have labs done at Dr. George HughEllison's office. Informed him to make sure our office gets a copy sent to Dr. [name]. Informed patient if this does not work that he could just come to our office after his appointment to have lab work done. Patient verbalized understanding.

## 2015-06-21 ENCOUNTER — Encounter (HOSPITAL_COMMUNITY): Payer: 59

## 2015-06-23 ENCOUNTER — Encounter (HOSPITAL_COMMUNITY): Payer: 59

## 2015-06-23 ENCOUNTER — Telehealth: Payer: Self-pay | Admitting: *Deleted

## 2015-06-23 ENCOUNTER — Encounter: Payer: Self-pay | Admitting: Endocrinology

## 2015-06-23 ENCOUNTER — Other Ambulatory Visit: Payer: 59

## 2015-06-23 ENCOUNTER — Ambulatory Visit (INDEPENDENT_AMBULATORY_CARE_PROVIDER_SITE_OTHER): Payer: 59 | Admitting: Endocrinology

## 2015-06-23 VITALS — BP 110/70 | HR 60 | Temp 97.8°F | Wt 180.2 lb

## 2015-06-23 DIAGNOSIS — E785 Hyperlipidemia, unspecified: Secondary | ICD-10-CM | POA: Diagnosis not present

## 2015-06-23 DIAGNOSIS — Z794 Long term (current) use of insulin: Secondary | ICD-10-CM | POA: Diagnosis not present

## 2015-06-23 DIAGNOSIS — E1122 Type 2 diabetes mellitus with diabetic chronic kidney disease: Secondary | ICD-10-CM

## 2015-06-23 DIAGNOSIS — E1129 Type 2 diabetes mellitus with other diabetic kidney complication: Secondary | ICD-10-CM

## 2015-06-23 DIAGNOSIS — N183 Chronic kidney disease, stage 3 (moderate): Secondary | ICD-10-CM

## 2015-06-23 DIAGNOSIS — I2510 Atherosclerotic heart disease of native coronary artery without angina pectoris: Secondary | ICD-10-CM

## 2015-06-23 DIAGNOSIS — Z9861 Coronary angioplasty status: Secondary | ICD-10-CM

## 2015-06-23 LAB — LIPID PANEL
CHOL/HDL RATIO: 4
Cholesterol: 161 mg/dL (ref 0–200)
HDL: 45.4 mg/dL (ref >39.00)
LDL CALC: 99 mg/dL (ref 0–99)
NONHDL: 115.7
Triglycerides: 82 mg/dL (ref 0.0–149.0)
VLDL: 16.4 mg/dL (ref 0.0–40.0)

## 2015-06-23 LAB — HEPATIC FUNCTION PANEL
ALK PHOS: 59 U/L (ref 39–117)
ALT: 33 U/L (ref 0–53)
AST: 27 U/L (ref 0–37)
Albumin: 4 g/dL (ref 3.5–5.2)
BILIRUBIN DIRECT: 0.1 mg/dL (ref 0.0–0.3)
BILIRUBIN TOTAL: 0.6 mg/dL (ref 0.2–1.2)
Total Protein: 6.7 g/dL (ref 6.0–8.3)

## 2015-06-23 LAB — POCT GLYCOSYLATED HEMOGLOBIN (HGB A1C): Hemoglobin A1C: 8.2

## 2015-06-23 MED ORDER — ROSUVASTATIN CALCIUM 40 MG PO TABS: 40.0000 mg | ORAL_TABLET | Freq: Every day | ORAL | Status: DC

## 2015-06-23 NOTE — Progress Notes (Signed)
Pre visit review using our clinic review tool, if applicable. No additional management support is needed unless otherwise documented below in the visit note. 

## 2015-06-23 NOTE — Progress Notes (Signed)
Subjective:    Patient ID: John Carrillo, male    DOB: [birth year], 65 y.o.   MRN: [id number]  HPI Pt returns for f/u of diabetes mellitus: DM type: 1 Dx'ed: 2008. Complications: CVA, PAD, renal insufficiency, and CAD. Therapy: insulin since soon after dx.  DKA: never Severe hypoglycemia: never. Pancreatitis: never Other: he declines pump; he declines multiple daily injections.   Interval history: He takes novolog 3 times a day (just before each meal), approx 09-14-10 units.  no cbg record, but states cbg's varies from 60-280.  It is highest at hs and in am. It is lowest after breakfast.   Past Medical History  Diagnosis Date  . CORONARY ARTERY DISEASE 08/11/2006  . CVA 12/19/2006    2008  . DIABETES MELLITUS, TYPE I 08/11/2006  . ECZEMA, HANDS 12/15/2009  . HYPERLIPIDEMIA 08/11/2006  . HYPERTENSION 08/11/2006  . MYOCARDIAL INFARCTION, HX OF 08/11/2006    07/14/01  . NSTEMI (non-ST elevated myocardial infarction) (HCC) 01/15/2015  . Acute kidney injury superimposed on chronic kidney disease (HCC) 01/25/2015    Past Surgical History  Procedure Laterality Date  . Tonsilectomy, adenoidectomy, bilateral myringotomy and tubes    . Coronary stent placement    . Cardiac catheterization N/A 01/16/2015    Procedure: Left Heart Cath and Coronary Angiography;  Surgeon: Runell GessJonathan J Berry, MD;  [facility];  Service: Cardiovascular;  Laterality: N/A;  . Tee without cardioversion N/A 01/26/2015    Procedure: TRANSESOPHAGEAL ECHOCARDIOGRAM (TEE);  Surgeon: Thurmon FairMihai Croitoru, MD;  [facility];  Service: Cardiovascular;  Laterality: N/A;    Social History   Social History  . Marital Status: Married    Spouse Name: N/A  . Number of Children: N/A  . Years of Education: N/A   Occupational History  . Writerroduction Manager [employer]   Social History Main Topics  . Smoking status: Former Smoker -- 1.00 packs/day for 30 years    Types: Cigarettes    Quit date:  05/03/2000  . Smokeless tobacco: Not on file  . Alcohol Use: 1.8 oz/week    0 Standard drinks or equivalent, 1 Cans of beer, 1 Glasses of wine, 1 Shots of liquor per week     Comment: 1-2/month  . Drug Use: No  . Sexual Activity: Not on file   Other Topics Concern  . Not on file   Social History Narrative   Married (kids are patients here, wife seen elsewhere). 3 step children, 1 biological. No grandkids.    In late 2016:   65 yo sophomore at [employer]- consider age 65 retirement   Oldest daughter - [employer]- social work intl business minor religion      Emergency planning/management officerroject manager for [employer]      Hobbies: race sail boats, adrenaline related activities    Current Outpatient Prescriptions on File Prior to Visit  Medication Sig Dispense Refill  . acetaminophen (TYLENOL) 325 MG tablet Take 2 tablets (650 mg total) by mouth every 4 (four) hours as needed for headache or mild pain.    [name]. apixaban (ELIQUIS) 5 MG TABS tablet Take 1 tablet (5 mg total) by mouth 2 (two) times daily. 60 tablet 11  . carvedilol (COREG) 6.25 MG tablet Take 1 tablet (6.25 mg total) by mouth 2 (two) times daily with a meal. 60 tablet 11  . Coenzyme Q10 60 MG CAPS Take 1 capsule by mouth daily.     . Insulin Glargine (BASAGLAR KWIKPEN) 100 UNIT/ML SOPN Inject  0.3 mLs (30 Units total) into the skin every morning. (Patient taking differently: Inject 30 Units into the skin every morning. 15 units in the AM and 15 units PM) 5 pen 11  . levothyroxine (SYNTHROID, LEVOTHROID) 50 MCG tablet Take 1 tablet (50 mcg total) by mouth daily. 30 tablet 5  . nitroGLYCERIN (NITROSTAT) 0.4 MG SL tablet Place 1 tablet (0.4 mg total) under the tongue every 5 (five) minutes as needed for chest pain. Chest pain 30 tablet 5  . Probiotic Product (PROBIOTIC PO) Take 1 capsule by mouth every morning.    . ramipril (ALTACE) 5 MG capsule Take 1 capsule (5 mg total) by mouth daily. 60 capsule 11  . spironolactone (ALDACTONE) 25 MG tablet  Take 0.5 tablets (12.5 mg total) by mouth daily. 45 tablet 11  . ticagrelor (BRILINTA) 90 MG TABS tablet Take 1 tablet (90 mg total) by mouth 2 (two) times daily. 60 tablet 11   No current facility-administered medications on file prior to visit.    No Known Allergies  Family History  Problem Relation Age of Onset  . Diabetes Neg Hx   . CAD Father     age 26  . CAD Paternal Grandfather     34s  . CAD Paternal Grandmother     87s  . Brain cancer Maternal Grandmother     BP 110/70 mmHg  Pulse 60  Temp(Src) 97.8 F (36.6 C) (Oral)  Wt 180 lb 4 oz (81.761 kg)  SpO2 96%  Review of Systems He denies LOC    Objective:   Physical Exam VITAL SIGNS:  See vs page GENERAL: no distress Pulses: dorsalis pedis intact bilat.   MSK: no deformity of the feet CV: no leg edema Skin:  no ulcer on the feet.  normal color and temp on the feet.  Neuro: sensation is intact to touch on the feet.    A1c=8.2%    Assessment & Plan:  DM: The pattern of his cbg's indicates he needs some adjustment in his therapy Noncompliance with cbg recording and insulin dosing: I'll work around this as best I can  Patient is advised the following: Patient Instructions  check your blood sugar 4 times a day--before the 3 meals, and at bedtime.  also check if you have symptoms of your blood sugar being too high or too low.  please keep a record of the readings and bring it to your next appointment here.  please call us sooner if you are having low blood sugar episodes.   Please come back a follow-up appointment in 3 months.   Please change your novolog to 3 times a day (just before each meal), 06-13-13 units.  Please continue the same basaglar

## 2015-06-23 NOTE — Telephone Encounter (Signed)
Pt notified of lab results and findings. Pt agreeable to d/c lipitor and start crestor 40 mg , FLP/LFT 8/25 when he see's Dr. [name]. Pt agreeable to plan of care.

## 2015-06-23 NOTE — Patient Instructions (Addendum)
check your blood sugar 4 times a day--before the 3 meals, and at bedtime.  also check if you have symptoms of your blood sugar being too high or too low.  please keep a record of the readings and bring it to your next appointment here.  please call us sooner if you are having low blood sugar episodes.   Please come back a follow-up appointment in 3 months.   Please change your novolog to 3 times a day (just before each meal), 06-13-13 units.  Please continue the same basaglar

## 2015-06-26 ENCOUNTER — Encounter (HOSPITAL_COMMUNITY): Payer: 59

## 2015-06-28 ENCOUNTER — Encounter (HOSPITAL_COMMUNITY): Payer: 59

## 2015-06-30 ENCOUNTER — Encounter (HOSPITAL_COMMUNITY): Payer: 59

## 2015-07-12 ENCOUNTER — Ambulatory Visit: Payer: 59 | Admitting: Cardiology

## 2015-07-31 ENCOUNTER — Ambulatory Visit (INDEPENDENT_AMBULATORY_CARE_PROVIDER_SITE_OTHER): Payer: 59 | Admitting: Internal Medicine

## 2015-07-31 ENCOUNTER — Encounter: Payer: Self-pay | Admitting: Internal Medicine

## 2015-07-31 VITALS — BP 102/66 | HR 55 | Ht 69.0 in | Wt 184.8 lb

## 2015-07-31 DIAGNOSIS — I471 Supraventricular tachycardia, unspecified: Secondary | ICD-10-CM

## 2015-07-31 DIAGNOSIS — I2510 Atherosclerotic heart disease of native coronary artery without angina pectoris: Secondary | ICD-10-CM

## 2015-07-31 DIAGNOSIS — I447 Left bundle-branch block, unspecified: Secondary | ICD-10-CM

## 2015-07-31 DIAGNOSIS — I255 Ischemic cardiomyopathy: Secondary | ICD-10-CM

## 2015-07-31 DIAGNOSIS — I5022 Chronic systolic (congestive) heart failure: Secondary | ICD-10-CM

## 2015-07-31 DIAGNOSIS — Z9861 Coronary angioplasty status: Secondary | ICD-10-CM

## 2015-07-31 NOTE — Progress Notes (Signed)
Electrophysiology Office Note   Date:  07/31/2015   ID:  John Carrillo, DOB [birth year], MRN [id number]  PCP:  Tana Conch, MD  Cardiologist:  Dr [name] Primary Electrophysiologist: Hillis Range, MD    Chief Complaint  Patient presents with  . ICM     History of Present Illness: John Carrillo is a 65 y.o. male who presents today for electrophysiology evaluation.   The patient has an ischemic CM (EF 20-25%) and NYHA Class III CHF.  He has LBBB. He presented to [facility] 12/17 with ACS in the setting of SVT. Catheterization demonstrated 95% stenosed mid RCA to distal RCA lesion that was successfully intervened on. LVEF 25% by cath. He had subsequent stroke.  He is on brilinta post PCI and has also been started on eliquis.  I do not see that he has ever had atrial fibrillation diagnosed.  He has been treated with an optimal medical regimen however EF remains depressed.  He has SOB with moderate activity.  His exercise tolerance is reduced.  He denies any further SVT.   He currently denies chest pain, recent fevers, chills, nausea or vomiting.    Past Medical History  Diagnosis Date  . CORONARY ARTERY DISEASE 08/11/2006  . CVA 12/19/2006    2008  . DIABETES MELLITUS, TYPE I 08/11/2006  . ECZEMA, HANDS 12/15/2009  . HYPERLIPIDEMIA 08/11/2006  . HYPERTENSION 08/11/2006  . MYOCARDIAL INFARCTION, HX OF 08/11/2006    07/14/01  . NSTEMI (non-ST elevated myocardial infarction) (HCC) 01/15/2015  . Acute kidney injury superimposed on chronic kidney disease (HCC) 01/25/2015   Past Surgical History  Procedure Laterality Date  . Tonsilectomy, adenoidectomy, bilateral myringotomy and tubes    . Coronary stent placement    . Cardiac catheterization N/A 01/16/2015    Procedure: Left Heart Cath and Coronary Angiography;  Surgeon: [name], MD;  Location: [facility] INVASIVE CV LAB;  Service: Cardiovascular;  Laterality: N/A;  . Tee without cardioversion N/A 01/26/2015    Procedure:  TRANSESOPHAGEAL ECHOCARDIOGRAM (TEE);  Surgeon: [name], MD;  Location: [facility] ENDOSCOPY;  Service: Cardiovascular;  Laterality: N/A;     Current Outpatient Prescriptions  Medication Sig Dispense Refill  . acetaminophen (TYLENOL) 325 MG tablet Take 2 tablets (650 mg total) by mouth every 4 (four) hours as needed for headache or mild pain.    [name] apixaban (ELIQUIS) 5 MG TABS tablet Take 1 tablet (5 mg total) by mouth 2 (two) times daily. 60 tablet 11  . carvedilol (COREG) 6.25 MG tablet Take 1 tablet (6.25 mg total) by mouth 2 (two) times daily with a meal. 60 tablet 11  . Coenzyme Q10 60 MG CAPS Take 1 capsule by mouth daily.     . insulin aspart (NOVOLOG FLEXPEN) 100 UNIT/ML FlexPen Inject into the skin 3 (three) times daily with meals. 3 times a day (just before each meal), 06-13-13 units,    . Insulin Glargine (BASAGLAR KWIKPEN) 100 UNIT/ML SOPN Inject 0.3 mLs (30 Units total) into the skin every morning. (Patient taking differently: Inject 30 Units into the skin every morning. 15 units in the AM and 15 units PM) 5 pen 11  . levothyroxine (SYNTHROID, LEVOTHROID) 50 MCG tablet Take 1 tablet (50 mcg total) by mouth daily. 30 tablet 5  . nitroGLYCERIN (NITROSTAT) 0.4 MG SL tablet Place 1 tablet (0.4 mg total) under the tongue every 5 (five) minutes as needed for chest pain. Chest pain 30 tablet 5  . Probiotic Product (PROBIOTIC PO) Take 1  capsule by mouth every morning.    . ramipril (ALTACE) 5 MG capsule Take 1 capsule (5 mg total) by mouth daily. 60 capsule 11  . rosuvastatin (CRESTOR) 40 MG tablet Take 1 tablet (40 mg total) by mouth daily. 90 tablet 3  . spironolactone (ALDACTONE) 25 MG tablet Take 0.5 tablets (12.5 mg total) by mouth daily. 45 tablet 11  . ticagrelor (BRILINTA) 90 MG TABS tablet Take 1 tablet (90 mg total) by mouth 2 (two) times daily. 60 tablet 11   No current facility-administered medications for this visit.    Allergies:   Review of patient's allergies indicates no  known allergies.   Social History:  The patient  reports that he quit smoking about 15 years ago. His smoking use included Cigarettes. He has a 30 pack-year smoking history. He does not have any smokeless tobacco history on file. He reports that he drinks about 1.8 oz of alcohol per week. He reports that he does not use illicit drugs.   Family History:  The patient's  family history includes Brain cancer in his maternal grandmother; CAD in his father, paternal grandfather, and paternal grandmother. There is no history of Diabetes.    ROS:  Please see the history of present illness.   All other systems are reviewed and negative.    PHYSICAL EXAM: VS:  BP 102/66 mmHg  Pulse 55  Ht 5\' 9"  (1.753 m)  Wt 184 lb 12.8 oz (83.825 kg)  BMI 27.28 kg/m2  SpO2 97% , BMI Body mass index is 27.28 kg/(m^2). GEN: Well nourished, well developed, in no acute distress HEENT: normal Neck: no JVD, carotid bruits, or masses Cardiac: RRR; no murmurs, rubs, or gallops,no edema  Respiratory:  clear to auscultation bilaterally, normal work of breathing GI: soft, nontender, nondistended, + BS MS: no deformity or atrophy Skin: warm and dry  Neuro:  Strength and sensation are intact Psych: euthymic mood, full affect  EKG:  EKG is ordered today. The ekg ordered today shows sinus bradycardia 55 bpm, PR 226 msec, LBBB (QRS 174 msec)   Recent Labs: 02/17/2015: TSH 12.19* 03/17/2015: BUN 23; Creat 1.24; Hemoglobin 14.2; Platelets 198; Potassium 4.2; Sodium 138 06/23/2015: ALT 33    Lipid Panel     Component Value Date/Time   CHOL 161 06/23/2015 0901   TRIG 82.0 06/23/2015 0901   HDL 45.40 06/23/2015 0901   CHOLHDL 4 06/23/2015 0901   VLDL 16.4 06/23/2015 0901   LDLCALC 99 06/23/2015 0901   LDLDIRECT 90.0 02/17/2015 0816     Wt Readings from Last 3 Encounters:  07/31/15 184 lb 12.8 oz (83.825 kg)  06/23/15 180 lb 4 oz (81.761 kg)  06/16/15 183 lb 9.6 oz (83.28 kg)      Other studies  Reviewed: Additional studies/ records that were reviewed today include: cath note, prior hospital records, Dr Ricki MillerNishans notes, recent echo    ASSESSMENT AND PLAN:  The patient has an ischemic CM (EF 20%), NYHA Class III CHF, LBBB and CAD. At this time, he meets MADIT II/ SCD-HeFT criteria for ICD implantation for primary prevention of sudden death.  Given LBBB with QRS > 150 msec, he also meets criteria for CRT.  Any further medicine titration is limited by hypotension.  Risks, benefits, alternatives to  BiVICD implantation were discussed in detail with the patient today. The patient  understands that the risks include but are not limited to bleeding, infection, pneumothorax, perforation, tamponade, vascular damage, renal failure, MI, stroke, death, inappropriate shocks, and lead  dislodgement and wishes to contemplate this further.  He had many questions answered today.  He would prefer a Medtronic MRI compatible device should he decide to proceed.  He worries as he works for [employer] and may have technology limitations related to his work.  I have instructed him to contact Medtronic tech services directly for these questions. Presently, he would prefer to continue his current medicines.  If he decides to consider BiV ICD he will contact my office.  He would require switching of brilinta to plavix at that time due to bleeding risks and would likely need to hold eliquis for 48 hours.  SVT- well controlled with current medicines, no further workup planned at this time  Follow-up with Dr [name] as scheduled  Current medicines are reviewed at length with the patient today.   The patient does not have concerns regarding his medicines.  The following changes were made today:  none   Signed, Hillis RangeJames Johniece Hornbaker, MD  07/31/2015 8:40 AM     [facility] [address] [place] [address] [phone] (office) [phone] (fax)

## 2015-07-31 NOTE — Patient Instructions (Addendum)
Medication Instructions:  Your physician recommends that you continue on your current medications as directed. Please refer to the Current Medication list given to you today.   Labwork: None ordered   Testing/Procedures: Your physician has recommended that you have a defibrillator inserted. An implantable cardioverter defibrillator (ICD) is a small device that is placed in your chest or, in rare cases, your abdomen. This device uses electrical pulses or shocks to help control life-threatening, irregular heartbeats that could lead the heart to suddenly stop beating (sudden cardiac arrest). Leads are attached to the ICD that goes into your heart. This is done in the hospital and usually requires an overnight stay. Please see the instruction sheet given to you today for more information.--(Bi-V Medtronic)  Will need to change Brilinta to Plavix if decide to have ICD(2 weeks before) Will need to hold Eliquis 48 hours prior  Please call [phone] if you decide to proceed--Keyshla Tunison,RN  Follow-Up: Your physician recommends that you schedule a follow-up appointment as needed with Dr Johney FrameAllred    Any Other Special Instructions Will Be Listed Below (If Applicable).     If you need a refill on your cardiac medications before your next appointment, please call your pharmacy.

## 2015-08-14 ENCOUNTER — Ambulatory Visit: Payer: 59 | Admitting: Cardiology

## 2015-08-15 ENCOUNTER — Other Ambulatory Visit: Payer: Self-pay | Admitting: Emergency Medicine

## 2015-08-15 MED ORDER — LEVOTHYROXINE SODIUM 50 MCG PO TABS: 50.0000 ug | ORAL_TABLET | Freq: Every day | ORAL | Status: DC

## 2015-09-14 ENCOUNTER — Encounter: Payer: Self-pay | Admitting: Cardiovascular Disease

## 2015-09-25 NOTE — Progress Notes (Signed)
Patient ID: John Carrillo, male   DOB: [birth year], 65 y.o.   MRN: [id number]    Cardiology Office Note    Date:  09/29/2015   ID:  MARE LUDTKE, DOB [birth year], MRN [id number]  PCP:  Tana Conch, MD  Cardiologist:  Dr. Charlton Haws   Electrophysiologist:  Dr. Hillis Range   Chief Complaint  Patient presents with  . LBBB    no sx    History of Present Illness:  John Carrillo is a 65 y.o. male with a hx of CAD status post anterior MI 15 years ago treated with stenting of the LAD, ICM, LBBB, prior CVA, HL, DM 2.  He was evaluated by Dr. [name] in 2008 for ICD implantation for primary prevention.  He declined at that time.    He was admitted 12/10-12/13 /16 with a non-STEMI. He presented to the emergency room with SVT with baseline LBBB. He converted to NSR with adenosine. LHC demonstrated patent LAD stent and high-grade RCA disease. This was treated with a DES. EF was severely depressed. By echocardiogram, ejection fraction was 15%. He was seen by Dr. [name] for EP evaluation. LifeVest was not recommended. Optimization of medical therapy was recommended over the next 3 months and then reevaluation of LV function.  Readmitted 12/20-12/22 with sudden vision loss in the left eye. MRI confirmed new right occipital lobe CVA. There was also old left MCA territory CVAs. He was seen by cardiology and EP. He declined ILR implantation. TEE demonstrated severe LV dysfunction with thick spontaneous echo contrast in LV. This is a marker for increased risk of thrombus. Anticoagulation was recommended. He was placed on Eliquis. Brilinta was continued. It was recommended that ASA could be stopped as it adds little to Brilinta.     Echo reviewed from 06/16/15  which is 5 months post intervention EF still 20-25% mild MR diastolic parameters ok  He wants to resume sailing / racing He indicates yacht clubs have AED;s on sight.  Saw doctor Allred 07/31/15 And they discussed medtronic MRI compatible  CRT device He declined at that time   Daughters back in country oldest was in [place] for 6 months and [name] who is a Holiday representative  At [employer] was in [place]   He feels washed out and BP is low   Past Medical History:  Diagnosis Date  . Acute kidney injury superimposed on chronic kidney disease (HCC) 01/25/2015  . CORONARY ARTERY DISEASE 08/11/2006  . CVA 12/19/2006   2008  . DIABETES MELLITUS, TYPE I 08/11/2006  . ECZEMA, HANDS 12/15/2009  . HYPERLIPIDEMIA 08/11/2006  . HYPERTENSION 08/11/2006  . MYOCARDIAL INFARCTION, HX OF 08/11/2006   07/14/01  . NSTEMI (non-ST elevated myocardial infarction) (HCC) 01/15/2015    Past Surgical History:  Procedure Laterality Date  . CARDIAC CATHETERIZATION N/A 01/16/2015   Procedure: Left Heart Cath and Coronary Angiography;  Surgeon: [name], MD;  Location: [facility] INVASIVE CV LAB;  Service: Cardiovascular;  Laterality: N/A;  . CORONARY STENT PLACEMENT    . TEE WITHOUT CARDIOVERSION N/A 01/26/2015   Procedure: TRANSESOPHAGEAL ECHOCARDIOGRAM (TEE);  Surgeon: [name], MD;  Location: [facility] ENDOSCOPY;  Service: Cardiovascular;  Laterality: N/A;  . TONSILECTOMY, ADENOIDECTOMY, BILATERAL MYRINGOTOMY AND TUBES      Current Outpatient Prescriptions  Medication Sig Dispense Refill  . acetaminophen (TYLENOL) 325 MG tablet Take 2 tablets (650 mg total) by mouth every 4 (four) hours as needed for headache or mild pain.    [name] apixaban (ELIQUIS) 5  MG TABS tablet Take 1 tablet (5 mg total) by mouth 2 (two) times daily. 60 tablet 11  . carvedilol (COREG) 3.125 MG tablet Take 1 tablet (3.125 mg total) by mouth 2 (two) times daily with a meal. 180 tablet 3  . Coenzyme Q10 60 MG CAPS Take 1 capsule by mouth daily.     . insulin aspart (NOVOLOG FLEXPEN) 100 UNIT/ML FlexPen Inject into the skin 3 (three) times daily with meals. 3 times a day (just before each meal), 06-13-13 units,    . Insulin Glargine (BASAGLAR KWIKPEN) 100 UNIT/ML SOPN Inject 0.3 mLs (30 Units total) into  the skin every morning. (Patient taking differently: Inject 30 Units into the skin every morning. 15 units in the AM and 15 units PM) 5 pen 11  . levothyroxine (SYNTHROID, LEVOTHROID) 50 MCG tablet Take 1 tablet (50 mcg total) by mouth daily. 30 tablet 5  . nitroGLYCERIN (NITROSTAT) 0.4 MG SL tablet Place 1 tablet (0.4 mg total) under the tongue every 5 (five) minutes as needed for chest pain. Chest pain 30 tablet 5  . Probiotic Product (PROBIOTIC PO) Take 1 capsule by mouth every morning.    . ramipril (ALTACE) 5 MG capsule Take 1 capsule (5 mg total) by mouth daily. 60 capsule 11  . rosuvastatin (CRESTOR) 40 MG tablet Take 1 tablet (40 mg total) by mouth daily. 90 tablet 3  . spironolactone (ALDACTONE) 25 MG tablet Take 0.5 tablets (12.5 mg total) by mouth every other day. 45 tablet 11  . ticagrelor (BRILINTA) 90 MG TABS tablet Take 1 tablet (90 mg total) by mouth 2 (two) times daily. 60 tablet 11   No current facility-administered medications for this visit.     Allergies:   Review of patient's allergies indicates no known allergies.   Social History   Social History  . Marital status: Married    Spouse name: N/A  . Number of children: N/A  . Years of education: N/A   Occupational History  . Writerroduction Manager [employer]   Social History Main Topics  . Smoking status: Former Smoker    Packs/day: 1.00    Years: 30.00    Types: Cigarettes    Quit date: 05/03/2000  . Smokeless tobacco: Never Used  . Alcohol use 1.8 oz/week    1 Glasses of wine, 1 Cans of beer, 1 Shots of liquor per week     Comment: 1-2/month  . Drug use: No  . Sexual activity: Not Asked   Other Topics Concern  . None   Social History Narrative   Married (kids are patients here, wife seen elsewhere). 3 step children, 1 biological. No grandkids.    In late 2016:   65 yo sophomore at [employer]- consider age 65 retirement   Oldest daughter - [employer]- social work intl business minor religion       Emergency planning/management officerroject manager for [employer]      Hobbies: race sail boats, adrenaline related activities     Family History:  The patient's family history includes Brain cancer in his maternal grandmother; CAD in his father, paternal grandfather, and paternal grandmother.   ROS:   Please see the history of present illness.    Review of Systems  Cardiovascular: Positive for chest pain.  Respiratory: Positive for shortness of breath.   All other systems reviewed and are negative.   PHYSICAL EXAM:   VS:  BP 94/70 (BP Location: Left Arm, Patient Position: Sitting, Cuff Size: Normal)   Pulse  63   Ht 5\' 9"  (1.753 m)   Wt 184 lb (83.5 kg)   SpO2 95%   BMI 27.17 kg/m    GEN: Well nourished, well developed, in no acute distress  HEENT: normal  Neck: no JVD, no masses Cardiac: Normal S1/S2,  RRR; no murmurs, rubs, or gallops, no edema;  right wrist without hematoma or mass    Respiratory:  clear to auscultation bilaterally; no wheezing, rhonchi or rales GI: soft, nontender, nondistended, + BS MS: no deformity or atrophy  Skin: warm and dry, no rash Neuro:  Bilateral strength equal, no focal deficits  Psych: Alert and oriented x 3, normal affect  Wt Readings from Last 3 Encounters:  09/29/15 184 lb (83.5 kg)  07/31/15 184 lb 12.8 oz (83.8 kg)  06/23/15 180 lb 4 oz (81.8 kg)      Studies/Labs Reviewed:   EKG:   07/31/15 sinus bradycardia, HR 55, LBBB, first-degree AV block (PR 212 ms), no significant change when compared to prior tracings  Recent Labs: 02/17/2015: TSH 12.19 03/17/2015: BUN 23; Creat 1.24; Hemoglobin 14.2; Platelets 198; Potassium 4.2; Sodium 138 06/23/2015: ALT 33   Recent Lipid Panel    Component Value Date/Time   CHOL 161 06/23/2015 0901   TRIG 82.0 06/23/2015 0901   HDL 45.40 06/23/2015 0901   CHOLHDL 4 06/23/2015 0901   VLDL 16.4 06/23/2015 0901   LDLCALC 99 06/23/2015 0901   LDLDIRECT 90.0 02/17/2015 0816    Additional studies/ records that were  reviewed today include:   TEE 01/26/15 EF 20-25%, anteroseptal/anterior/apical akinesis, grade 2 diastolic dysfunction, moderate apical spontaneous echo contrast indicative of stasis, mild MR, mild LAE with mild intermittent spontaneous echo contrast in the LAA  [facility] proximal stent patent RCA mid 95% EF 25% PCI: 3.5 x 20 mm Promus premier DES to the mid RCA     Echo 01/16/15 Septal and apical akinesis, diffuse HK, EF 15%, mild MR, mild LAE, mild RAE, PASP 59 mmHg, moderate TR   ASSESSMENT:    1. LBBB (left bundle branch block)   2. Medication management     PLAN:  In order of problems listed above:  1. CAD - s/p  non-STEMI treated with a DES to the RCA 01/16/15  He is no longer on aspirin as he is on Eliquis and Brilinta. We discussed the importance of remaining on Brilinta for 12 months. Continue Brilinta, statin, beta blocker, ACE inhibitor. Continue cardiac rehabilitation.  2. Ischemic CM - low output cut back coreg to 3.125 bid and take aldactone qod  Will make F/u with Allred to discuss BiV AICD again I suspect he will consent in next 6 months   3. Chronic Systolic HF - Volume stable. Decrease aldactone   4. S/p Embolic CVA - As noted, TEE with thick spontaneous echo contrast in LV which is a high risk marker for thromboembolism.  He is now on Eliquis. With AICD can internally monitor for PAF.  5. HTN - Blood pressure running low. He is asymptomatic. As noted, I will decrease his ramipril to 5 mg daily.  6. HL -  Continue statin. Obtain follow-up lipids and LFTs in 6 weeks.  7. SVT - Continue beta blocker.  8. CKD -  Lab Results  Component Value Date   CREATININE 1.24 03/17/2015   BUN 23 03/17/2015   NA 138 03/17/2015   K 4.2 03/17/2015   CL 103 03/17/2015   CO2 25 03/17/2015     Check BMET and  BNP    Charlton Haws

## 2015-09-29 ENCOUNTER — Encounter (INDEPENDENT_AMBULATORY_CARE_PROVIDER_SITE_OTHER): Payer: Self-pay

## 2015-09-29 ENCOUNTER — Ambulatory Visit (INDEPENDENT_AMBULATORY_CARE_PROVIDER_SITE_OTHER): Payer: 59 | Admitting: Endocrinology

## 2015-09-29 ENCOUNTER — Other Ambulatory Visit: Payer: 59 | Admitting: *Deleted

## 2015-09-29 ENCOUNTER — Encounter: Payer: Self-pay | Admitting: Endocrinology

## 2015-09-29 ENCOUNTER — Ambulatory Visit (INDEPENDENT_AMBULATORY_CARE_PROVIDER_SITE_OTHER): Payer: 59 | Admitting: Cardiovascular Disease

## 2015-09-29 ENCOUNTER — Encounter: Payer: Self-pay | Admitting: Cardiovascular Disease

## 2015-09-29 VITALS — BP 110/64 | HR 60 | Ht 69.0 in | Wt 183.0 lb

## 2015-09-29 VITALS — BP 94/70 | HR 63 | Ht 69.0 in | Wt 184.0 lb

## 2015-09-29 DIAGNOSIS — Z794 Long term (current) use of insulin: Secondary | ICD-10-CM | POA: Diagnosis not present

## 2015-09-29 DIAGNOSIS — N183 Chronic kidney disease, stage 3 (moderate): Secondary | ICD-10-CM | POA: Diagnosis not present

## 2015-09-29 DIAGNOSIS — Z79899 Other long term (current) drug therapy: Secondary | ICD-10-CM | POA: Diagnosis not present

## 2015-09-29 DIAGNOSIS — I447 Left bundle-branch block, unspecified: Secondary | ICD-10-CM | POA: Diagnosis not present

## 2015-09-29 DIAGNOSIS — E785 Hyperlipidemia, unspecified: Secondary | ICD-10-CM

## 2015-09-29 DIAGNOSIS — Z9861 Coronary angioplasty status: Secondary | ICD-10-CM

## 2015-09-29 DIAGNOSIS — E1122 Type 2 diabetes mellitus with diabetic chronic kidney disease: Secondary | ICD-10-CM

## 2015-09-29 DIAGNOSIS — I2510 Atherosclerotic heart disease of native coronary artery without angina pectoris: Secondary | ICD-10-CM

## 2015-09-29 LAB — HEPATIC FUNCTION PANEL
ALBUMIN: 4.1 g/dL (ref 3.6–5.1)
ALK PHOS: 59 U/L (ref 40–115)
ALT: 31 U/L (ref 9–46)
AST: 24 U/L (ref 10–35)
Bilirubin, Direct: 0.1 mg/dL (ref <=0.2)
Indirect Bilirubin: 0.5 mg/dL (ref 0.2–1.2)
TOTAL PROTEIN: 6.6 g/dL (ref 6.1–8.1)
Total Bilirubin: 0.6 mg/dL (ref 0.2–1.2)

## 2015-09-29 LAB — LIPID PANEL
CHOL/HDL RATIO: 2.3 ratio (ref <=5.0)
Cholesterol: 149 mg/dL (ref 125–200)
HDL: 65 mg/dL (ref >=40)
LDL CALC: 66 mg/dL (ref <130)
TRIGLYCERIDES: 88 mg/dL (ref <150)
VLDL: 18 mg/dL (ref <30)

## 2015-09-29 LAB — BASIC METABOLIC PANEL
BUN: 24 mg/dL (ref 7–25)
CO2: 28 mmol/L (ref 20–31)
Calcium: 9.1 mg/dL (ref 8.6–10.3)
Chloride: 106 mmol/L (ref 98–110)
Creat: 1.34 mg/dL — ABNORMAL HIGH (ref 0.70–1.25)
Glucose, Bld: 76 mg/dL (ref 65–99)
POTASSIUM: 4.5 mmol/L (ref 3.5–5.3)
Sodium: 141 mmol/L (ref 135–146)

## 2015-09-29 LAB — POCT GLYCOSYLATED HEMOGLOBIN (HGB A1C): HEMOGLOBIN A1C: 8.2

## 2015-09-29 LAB — BRAIN NATRIURETIC PEPTIDE: Brain Natriuretic Peptide: 136.5 pg/mL — ABNORMAL HIGH (ref <100)

## 2015-09-29 MED ORDER — INSULIN ASPART 100 UNIT/ML FLEXPEN: PEN_INJECTOR | SUBCUTANEOUS | 11 refills | Status: DC

## 2015-09-29 MED ORDER — CARVEDILOL 3.125 MG PO TABS: 3.1250 mg | ORAL_TABLET | Freq: Two times a day (BID) | ORAL | 3 refills | Status: DC

## 2015-09-29 MED ORDER — SPIRONOLACTONE 25 MG PO TABS: 12.5000 mg | ORAL_TABLET | ORAL | 11 refills | Status: DC

## 2015-09-29 NOTE — Patient Instructions (Addendum)
check your blood sugar 4 times a day--before the 3 meals, and at bedtime.  also check if you have symptoms of your blood sugar being too high or too low.  please keep a record of the readings and bring it to your next appointment here.  please call us sooner if you are having low blood sugar episodes.   Please come back a follow-up appointment in 2 months.   Please change your novolog to 3 times a day (just before each meal), 04-14-16 units.   Please continue the same basaglar.

## 2015-09-29 NOTE — Patient Instructions (Addendum)
Medication Instructions:  Your physician has recommended you make the following change in your medication:  1-Decrease Coreg 3.125 mg by mouth twice daily 2-Decrease aldactone 12.5 mg By mouth every other daily   Labwork: Your physician recommends that you have lab work today- BMET and BNP   Testing/Procedures: NONE  Follow-Up:  Your physician recommends that you schedule a follow-up appointment in: 3 months with Dr. Johney FrameAllred  Your physician wants you to follow-up in: 6 months with Dr. [name]. You will receive a reminder letter in the mail two months in advance. If you don't receive a letter, please call our office to schedule the follow-up appointment.   If you need a refill on your cardiac medications before your next appointment, please call your pharmacy.

## 2015-09-29 NOTE — Progress Notes (Signed)
Subjective:    Patient ID: John Carrillo, male    DOB: 1951-01-10, 65 y.o.   MRN: [id number]  HPI Pt returns for f/u of diabetes mellitus: DM type: 1 Dx'ed: 2008. Complications: CVA, PAD, renal insufficiency, and CAD. Therapy: insulin since soon after dx.  DKA: never Severe hypoglycemia: never. Pancreatitis: never Other: he declines pump; he takes multiple daily injections.   Interval history: no cbg record, but states cbg's varies from 60-300.  It is highest at hs and in am. It is lowest after breakfast.  He says he never misses the insulin.  He says his ability to care for his DM has been compromised by wife's illness.   Past Medical History:  Diagnosis Date  . Acute kidney injury superimposed on chronic kidney disease (HCC) 01/25/2015  . CORONARY ARTERY DISEASE 08/11/2006  . CVA 12/19/2006   2008  . DIABETES MELLITUS, TYPE I 08/11/2006  . ECZEMA, HANDS 12/15/2009  . HYPERLIPIDEMIA 08/11/2006  . HYPERTENSION 08/11/2006  . MYOCARDIAL INFARCTION, HX OF 08/11/2006   07/14/01  . NSTEMI (non-ST elevated myocardial infarction) (HCC) 01/15/2015    Past Surgical History:  Procedure Laterality Date  . CARDIAC CATHETERIZATION N/A 01/16/2015   Procedure: Left Heart Cath and Coronary Angiography;  Surgeon: [name], MD;  Location[facility] INVASIVE CV LAB;  Service: Cardiovascular;  Laterality: N/A;  . CORONARY STENT PLACEMENT    . TEE WITHOUT CARDIOVERSION N/A 01/26/2015   Procedure: TRANSESOPHAGEAL ECHOCARDIOGRAM (TEE);  Surgeon: [name], MD;  Location: [facility] ENDOSCOPY;  Service: Cardiovascular;  Laterality: N/A;  . TONSILECTOMY, ADENOIDECTOMY, BILATERAL MYRINGOTOMY AND TUBES      Social History   Social History  . Marital status: Married    Spouse name: N/A  . Number of children: N/A  . Years of education: N/A   Occupational History  . Writer   Social History Main Topics  . Smoking status: Former Smoker    Packs/day: 1.00    Years: 30.00    Types: Cigarettes    Quit date: 05/03/2000  . Smokeless tobacco: Never Used  . Alcohol use 1.8 oz/week    1 Glasses of wine, 1 Cans of beer, 1 Shots of liquor per week     Comment: 1-2/month  . Drug use: No  . Sexual activity: Not on file   Other Topics Concern  . Not on file   Social History Narrative   Married (kids are patients here, wife seen elsewhere). 3 step children, 1 biological. No grandkids.    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 acetaminophen (TYLENOL) 325 MG tablet Take 2 tablets (650 mg total) by mouth every 4 (four) hours as needed for headache or mild pain.    [name] apixaban (ELIQUIS) 5 MG TABS tablet Take 1 tablet (5 mg total) by mouth 2 (two) times daily. 60 tablet 11  . carvedilol (COREG) 3.125 MG tablet Take 1 tablet (3.125 mg total) by mouth 2 (two) times daily with a meal. 180 tablet 3  . Coenzyme Q10 60 MG CAPS Take 1 capsule by mouth daily.     . Insulin Glargine (BASAGLAR KWIKPEN) 100 UNIT/ML  SOPN Inject 0.3 mLs (30 Units total) into the skin every morning. (Patient taking differently: Inject 30 Units into the skin every morning. 15 units in the AM and 15 units PM) 5 pen 11  . levothyroxine (SYNTHROID, LEVOTHROID) 50 MCG tablet Take 1 tablet (50 mcg total) by mouth daily. 30 tablet 5  . nitroGLYCERIN (NITROSTAT) 0.4 MG SL tablet Place 1 tablet (0.4 mg total) under the tongue every 5 (five) minutes as needed for chest pain. Chest pain 30 tablet 5  . Probiotic Product (PROBIOTIC PO) Take 1 capsule by mouth every morning.    . ramipril (ALTACE) 5 MG capsule Take 1 capsule (5 mg total) by mouth daily. 60 capsule 11  . rosuvastatin (CRESTOR) 40 MG tablet Take  1 tablet (40 mg total) by mouth daily. 90 tablet 3  . spironolactone (ALDACTONE) 25 MG tablet Take 0.5 tablets (12.5 mg total) by mouth every other day. 45 tablet 11  . ticagrelor (BRILINTA) 90 MG TABS tablet Take 1 tablet (90 mg total) by mouth 2 (two) times daily. 60 tablet 11   No current facility-administered medications on file prior to visit.     No Known Allergies  Family History  Problem Relation Age of Onset  . CAD Father     age 64  . CAD Paternal Grandfather     8570s  . CAD Paternal Grandmother     5470s  . Brain cancer Maternal Grandmother   . Diabetes Neg Hx     BP 110/64   Pulse 60   Ht 5\' 9"  (1.753 m)   Wt 183 lb (83 kg)   BMI 27.02 kg/m    Review of Systems Denies LOC    Objective:   Physical Exam VITAL SIGNS:  See vs page GENERAL: no distress Pulses: dorsalis pedis intact bilat.   MSK: no deformity of the feet CV: no leg edema Skin:  no ulcer on the feet.  normal color and temp on the feet. Neuro: sensation is intact to touch on the feet.      A1c=8.2%    Assessment & Plan:  Insulin-requiring type 2 DM: he needs increased rx

## 2015-10-02 ENCOUNTER — Telehealth: Payer: Self-pay | Admitting: *Deleted

## 2015-10-02 NOTE — Telephone Encounter (Signed)
Pt notified of lab results by phone with verbal understanding.  

## 2015-10-06 ENCOUNTER — Other Ambulatory Visit: Payer: Self-pay

## 2015-10-06 DIAGNOSIS — N183 Chronic kidney disease, stage 3 (moderate): Principal | ICD-10-CM

## 2015-10-06 DIAGNOSIS — Z794 Long term (current) use of insulin: Principal | ICD-10-CM

## 2015-10-06 DIAGNOSIS — E1122 Type 2 diabetes mellitus with diabetic chronic kidney disease: Secondary | ICD-10-CM

## 2015-10-06 MED ORDER — GLUCOSE BLOOD VI STRP: ORAL_STRIP | 12 refills | Status: DC

## 2015-11-22 ENCOUNTER — Telehealth (HOSPITAL_COMMUNITY): Payer: Self-pay

## 2015-11-22 NOTE — Telephone Encounter (Signed)
-----   Message from Wendall StadePeter C Nishan, MD sent at 05/10/2015  2:23 PM EDT ----- Regarding: RE: request to strength train Ok to do strength training  ----- Message -----    From: Paulita FujitaAmber D Kobie Matkins    Sent: 05/10/2015   7:13 AM      To: Wendall StadePeter C Nishan, MD Subject: request to strength train                      Patient has been in cardiac rehab approximately 10 weeks and doing is well. Pt has been averaging 5.7 METS and BP/HR has been in stabled with exercise. Pt is interested in doing strength training. If you feel this patient is appropriate to begin strength training please f/u with cardiac rehab staff.    Truitt Cruey Genuine PartsFair,MS,ACSM RCEP

## 2015-11-24 ENCOUNTER — Encounter: Payer: Self-pay | Admitting: Endocrinology

## 2015-11-24 ENCOUNTER — Ambulatory Visit (INDEPENDENT_AMBULATORY_CARE_PROVIDER_SITE_OTHER): Payer: 59 | Admitting: Endocrinology

## 2015-11-24 ENCOUNTER — Telehealth: Payer: Self-pay | Admitting: Internal Medicine

## 2015-11-24 VITALS — BP 112/70 | HR 67 | Ht 69.0 in | Wt 184.0 lb

## 2015-11-24 DIAGNOSIS — E1122 Type 2 diabetes mellitus with diabetic chronic kidney disease: Secondary | ICD-10-CM

## 2015-11-24 DIAGNOSIS — N183 Chronic kidney disease, stage 3 unspecified: Secondary | ICD-10-CM

## 2015-11-24 DIAGNOSIS — Z794 Long term (current) use of insulin: Secondary | ICD-10-CM | POA: Diagnosis not present

## 2015-11-24 MED ORDER — BASAGLAR KWIKPEN 100 UNIT/ML ~~LOC~~ SOPN: 16.0000 [IU] | PEN_INJECTOR | Freq: Two times a day (BID) | SUBCUTANEOUS | 11 refills | Status: DC

## 2015-11-24 NOTE — Patient Instructions (Addendum)
check your blood sugar 4 times a day--before the 3 meals, and at bedtime.  also check if you have symptoms of your blood sugar being too high or too low.  please keep a record of the readings and bring it to your next appointment here.  please call us sooner if you are having low blood sugar episodes.   Please come back a follow-up appointment in 3 months.   Please reduce your novolog to 3 times a day (just before each meal), 03-17-16 units.   Please increase the basaglar to 16 units twice a day. blood tests are requested for you today.  We'll let you know about the results.

## 2015-11-24 NOTE — Telephone Encounter (Signed)
Please schedule a follow up appointment with Dr Johney FrameAllred to discuss ICD.  Its been 4 months since his last OV.

## 2015-11-24 NOTE — Telephone Encounter (Signed)
New message   Pt verbalized that he is calling to schedule his implant with Dr.Allred

## 2015-11-24 NOTE — Progress Notes (Signed)
Subjective:    Patient ID: John Carrillo, male    DOB: December 31, 1950, 66 y.o.   MRN: [id number]  HPI Pt returns for f/u of diabetes mellitus: DM type: 1 Dx'ed: 2008. Complications: CVA, PAD, renal insufficiency, and CAD. Therapy: insulin since soon after dx.  DKA: never Severe hypoglycemia: never. Pancreatitis: never Other: he declines pump; he takes multiple daily injections.   Interval history: no cbg record, but states cbg's varies from 49-300's.  It is still highest fasting. It is still lowest at lunch.  He says he never misses the insulin.  He says his ability to care for his DM has been compromised by wife's illness.   Past Medical History:  Diagnosis Date  . Acute kidney injury superimposed on chronic kidney disease (HCC) 01/25/2015  . CORONARY ARTERY DISEASE 08/11/2006  . CVA 12/19/2006   2008  . DIABETES MELLITUS, TYPE I 08/11/2006  . ECZEMA, HANDS 12/15/2009  . HYPERLIPIDEMIA 08/11/2006  . HYPERTENSION 08/11/2006  . MYOCARDIAL INFARCTION, HX OF 08/11/2006   07/14/01  . NSTEMI (non-ST elevated myocardial infarction) (HCC) 01/15/2015    Past Surgical History:  Procedure Laterality Date  . CARDIAC CATHETERIZATION N/A 01/16/2015   Procedure: Left Heart Cath and Coronary Angiography;  Surgeon: [name], MD;  Location: [facility] INVASIVE CV LAB;  Service: Cardiovascular;  Laterality: N/A;  . CORONARY STENT PLACEMENT    . TEE WITHOUT CARDIOVERSION N/A 01/26/2015   Procedure: TRANSESOPHAGEAL ECHOCARDIOGRAM (TEE);  Surgeon: [name], MD;  Location: [facility] ENDOSCOPY;  Service: Cardiovascular;  Laterality: N/A;  . TONSILECTOMY, ADENOIDECTOMY, BILATERAL MYRINGOTOMY AND TUBES      Social History   Social History  . Marital status: Married    Spouse name: N/A  . Number of children: N/A  . Years of education: N/A   Occupational History  . Writer   Social History Main Topics  . Smoking status: Former Smoker    Packs/day: 1.00    Years: 30.00   Types: Cigarettes    Quit date: 05/03/2000  . Smokeless tobacco: Never Used  . Alcohol use 1.8 oz/week    1 Glasses of wine, 1 Cans of beer, 1 Shots of liquor per week     Comment: 1-2/month  . Drug use: No  . Sexual activity: Not on file   Other Topics Concern  . Not on file   Social History Narrative   Married (kids are patients here, wife seen elsewhere). 3 step children, 1 biological. No grandkids.    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 acetaminophen (TYLENOL) 325 MG tablet Take 2 tablets (650 mg total) by mouth every 4 (four) hours as needed for headache or mild pain.    [name] apixaban (ELIQUIS) 5 MG TABS tablet Take 1 tablet (5 mg total) by mouth 2 (two) times daily. 60 tablet 11  . carvedilol (COREG) 3.125 MG tablet Take 1 tablet (3.125 mg total) by mouth 2 (two) times daily with a meal. 180 tablet 3  . Coenzyme Q10 60 MG CAPS Take 1 capsule by mouth daily.     [name] glucose blood (ONETOUCH VERIO) test strip Use as instructed  100 each 12  . insulin aspart (NOVOLOG FLEXPEN) 100 UNIT/ML FlexPen 3 times a day (just before each meal), 04-14-16 units, and pen needles 4/day. 15 mL 11  . levothyroxine (SYNTHROID, LEVOTHROID) 50 MCG tablet Take 1 tablet (50 mcg total) by mouth daily. 30 tablet 5  . nitroGLYCERIN (NITROSTAT) 0.4 MG SL tablet Place 1 tablet (0.4 mg total) under the tongue every 5 (five) minutes as needed for chest pain. Chest pain 30 tablet 5  . Probiotic Product (PROBIOTIC PO) Take 1 capsule by mouth every morning.    . ramipril (ALTACE) 5 MG capsule Take 1 capsule (5 mg total) by mouth daily. 60 capsule 11  . rosuvastatin (CRESTOR) 40 MG tablet Take 1 tablet (40 mg  total) by mouth daily. 90 tablet 3  . spironolactone (ALDACTONE) 25 MG tablet Take 0.5 tablets (12.5 mg total) by mouth every other day. 45 tablet 11  . ticagrelor (BRILINTA) 90 MG TABS tablet Take 1 tablet (90 mg total) by mouth 2 (two) times daily. 60 tablet 11   No current facility-administered medications on file prior to visit.     No Known Allergies  Family History  Problem Relation Age of Onset  . CAD Father     age 64  . CAD Paternal Grandfather     3570s  . CAD Paternal Grandmother     3870s  . Brain cancer Maternal Grandmother   . Diabetes Neg Hx     BP 112/70   Pulse 67   Ht 5\' 9"  (1.753 m)   Wt 184 lb (83.5 kg)   SpO2 95%   BMI 27.17 kg/m    Review of Systems Denies LOC    Objective:   Physical Exam VITAL SIGNS:  See vs page GENERAL: no distress Pulses: dorsalis pedis intact bilat.   MSK: no deformity of the feet CV: no leg edema Skin:  no ulcer on the feet.  normal color and temp on the feet. Neuro: sensation is intact to touch on the feet.    Lab Results  Component Value Date   HGBA1C 8.2 09/29/2015      Assessment & Plan:  Type 1 DM, with stage 3 renal disease: he needs increased rx.   Patient is advised the following: Patient Instructions  check your blood sugar 4 times a day--before the 3 meals, and at bedtime.  also check if you have symptoms of your blood sugar being too high or too low.  please keep a record of the readings and bring it to your next appointment here.  please call us sooner if you are having low blood sugar episodes.   Please come back a follow-up appointment in 3 months.   Please reduce your novolog to 3 times a day (just before each meal), 03-17-16 units.   Please increase the basaglar to 16 units twice a day. blood tests are requested for you today.  We'll let you know about the results.

## 2015-11-27 LAB — FRUCTOSAMINE: Fructosamine: 413 umol/L — ABNORMAL HIGH (ref 190–270)

## 2015-11-28 ENCOUNTER — Encounter: Payer: Self-pay | Admitting: Internal Medicine

## 2015-12-05 ENCOUNTER — Telehealth: Payer: Self-pay | Admitting: Internal Medicine

## 2015-12-05 NOTE — Telephone Encounter (Signed)
Please call patient back regarding scheduling an upcoming procedure/implant

## 2015-12-05 NOTE — Telephone Encounter (Signed)
Spoke with patient and let him know will get all scheduled at his office visit

## 2015-12-11 ENCOUNTER — Other Ambulatory Visit: Payer: 59

## 2015-12-11 ENCOUNTER — Ambulatory Visit (INDEPENDENT_AMBULATORY_CARE_PROVIDER_SITE_OTHER): Payer: 59 | Admitting: Internal Medicine

## 2015-12-11 ENCOUNTER — Encounter: Payer: Self-pay | Admitting: Internal Medicine

## 2015-12-11 ENCOUNTER — Encounter (INDEPENDENT_AMBULATORY_CARE_PROVIDER_SITE_OTHER): Payer: Self-pay

## 2015-12-11 VITALS — BP 130/82 | HR 61 | Ht 69.0 in | Wt 190.4 lb

## 2015-12-11 DIAGNOSIS — I447 Left bundle-branch block, unspecified: Secondary | ICD-10-CM

## 2015-12-11 DIAGNOSIS — I5022 Chronic systolic (congestive) heart failure: Secondary | ICD-10-CM

## 2015-12-11 DIAGNOSIS — I471 Supraventricular tachycardia: Secondary | ICD-10-CM | POA: Diagnosis not present

## 2015-12-11 DIAGNOSIS — I255 Ischemic cardiomyopathy: Secondary | ICD-10-CM | POA: Diagnosis not present

## 2015-12-11 MED ORDER — CLOPIDOGREL BISULFATE 75 MG PO TABS: 75.0000 mg | ORAL_TABLET | Freq: Every day | ORAL | 3 refills | Status: DC

## 2015-12-11 NOTE — Progress Notes (Signed)
 Electrophysiology Office Note   Date:  12/11/2015   ID:  John Carrillo, DOB 10/30/1950, MRN [id number]  PCP:  Cordera Hunter, MD  Cardiologist:  Dr Nishan Primary Electrophysiologist: Aundray Cartlidge, MD    Chief Complaint  Patient presents with  . Follow-up    ICM     History of Present Illness: John Carrillo is a 65 y.o. male who presents today for electrophysiology evaluation.   The patient has an ischemic CM (EF 20-25%) and NYHA Class II CHF.  He has LBBB. He presented to [place] 12/17 with ACS in the setting of SVT. Catheterization demonstrated 95% stenosed mid RCA to distal RCA lesion that was successfully intervened on. LVEF 25% by cath. He had subsequent stroke.  He is on brilinta post PCI and has also been started on eliquis without having afib diagnosed.  He has been treated with an optimal medical regimen however EF remains depressed.  He denies any further SVT.  He has resumed normal activity.      Past Medical History:  Diagnosis Date  . Acute kidney injury superimposed on chronic kidney disease (HCC) 01/25/2015  . CORONARY ARTERY DISEASE 08/11/2006  . CVA 12/19/2006   2008  . DIABETES MELLITUS, TYPE I 08/11/2006  . ECZEMA, HANDS 12/15/2009  . HYPERLIPIDEMIA 08/11/2006  . HYPERTENSION 08/11/2006  . MYOCARDIAL INFARCTION, HX OF 08/11/2006   07/14/01  . NSTEMI (non-ST elevated myocardial infarction) (HCC) 01/15/2015   Past Surgical History:  Procedure Laterality Date  . CARDIAC CATHETERIZATION N/A 01/16/2015   Procedure: Left Heart Cath and Coronary Angiography;  Surgeon: Jonathan J Berry, MD;  Location: [facility] INVASIVE CV LAB;  Service: Cardiovascular;  Laterality: N/A;  . CORONARY STENT PLACEMENT    . TEE WITHOUT CARDIOVERSION N/A 01/26/2015   Procedure: TRANSESOPHAGEAL ECHOCARDIOGRAM (TEE);  Surgeon: Mihai Croitoru, MD;  Location: [facility] ENDOSCOPY;  Service: Cardiovascular;  Laterality: N/A;  . TONSILECTOMY, ADENOIDECTOMY, BILATERAL MYRINGOTOMY AND TUBES        Current Outpatient Prescriptions  Medication Sig Dispense Refill  . acetaminophen (TYLENOL) 325 MG tablet Take 2 tablets (650 mg total) by mouth every 4 (four) hours as needed for headache or mild pain.    . apixaban (ELIQUIS) 5 MG TABS tablet Take 1 tablet (5 mg total) by mouth 2 (two) times daily. 60 tablet 11  . carvedilol (COREG) 3.125 MG tablet Take 1 tablet (3.125 mg total) by mouth 2 (two) times daily with a meal. 180 tablet 3  . Coenzyme Q10 60 MG CAPS Take 1 capsule by mouth daily.     . glucose blood (ONETOUCH VERIO) test strip Use as instructed 100 each 12  . insulin aspart (NOVOLOG FLEXPEN) 100 UNIT/ML FlexPen 3 times a day (just before each meal), 04-14-16 units, and pen needles 4/day. 15 mL 11  . Insulin Glargine (BASAGLAR KWIKPEN) 100 UNIT/ML SOPN Inject 0.16 mLs (16 Units total) into the skin 2 (two) times daily. 5 pen 11  . levothyroxine (SYNTHROID, LEVOTHROID) 50 MCG tablet Take 1 tablet (50 mcg total) by mouth daily. 30 tablet 5  . nitroGLYCERIN (NITROSTAT) 0.4 MG SL tablet Place 1 tablet (0.4 mg total) under the tongue every 5 (five) minutes as needed for chest pain. Chest pain 30 tablet 5  . Probiotic Product (PROBIOTIC PO) Take 1 capsule by mouth every morning.    . ramipril (ALTACE) 5 MG capsule Take 1 capsule (5 mg total) by mouth daily. 60 capsule 11  . rosuvastatin (CRESTOR) 40 MG tablet Take 1 tablet (  40 mg total) by mouth daily. 90 tablet 3  . spironolactone (ALDACTONE) 25 MG tablet Take 0.5 tablets (12.5 mg total) by mouth every other day. 45 tablet 11  . ticagrelor (BRILINTA) 90 MG TABS tablet Take 1 tablet (90 mg total) by mouth 2 (two) times daily. 60 tablet 11   No current facility-administered medications for this visit.     Allergies:   Patient has no known allergies.   Social History:  The patient  reports that he quit smoking about 15 years ago. His smoking use included Cigarettes. He has a 30.00 pack-year smoking history. He has never used smokeless  tobacco. He reports that he drinks about 1.8 oz of alcohol per week . He reports that he does not use drugs.   Family History:  The patient's  family history includes Brain cancer in his maternal grandmother; CAD in his father, paternal grandfather, and paternal grandmother.    ROS:  Please see the history of present illness.   All other systems are reviewed and negative.    PHYSICAL EXAM: VS:  BP 130/82   Pulse 61   Ht 5' 9" (1.753 m)   Wt 190 lb 6.4 oz (86.4 kg)   BMI 28.12 kg/m  , BMI Body mass index is 28.12 kg/m. GEN: Well nourished, well developed, in no acute distress  HEENT: normal  Neck: no JVD, carotid bruits, or masses Cardiac: RRR; no murmurs, rubs, or gallops,no edema  Respiratory:  clear to auscultation bilaterally, normal work of breathing GI: soft, nontender, nondistended, + BS MS: no deformity or atrophy  Skin: warm and dry  Neuro:  Strength and sensation are intact Psych: euthymic mood, full affect  EKG:  EKG is ordered today. The ekg ordered today shows sinus rhythm 61 bpm, PR 216 msec, LBBB (QRS 168 msec)   Recent Labs: 02/17/2015: TSH 12.19 03/17/2015: Hemoglobin 14.2; Platelets 198 09/29/2015: ALT 31; Brain Natriuretic Peptide 136.5; BUN 24; Creat 1.34; Potassium 4.5; Sodium 141    Lipid Panel     Component Value Date/Time   CHOL 149 09/29/2015 0814   TRIG 88 09/29/2015 0814   HDL 65 09/29/2015 0814   CHOLHDL 2.3 09/29/2015 0814   VLDL 18 09/29/2015 0814   LDLCALC 66 09/29/2015 0814   LDLDIRECT 90.0 02/17/2015 0816     Wt Readings from Last 3 Encounters:  12/11/15 190 lb 6.4 oz (86.4 kg)  11/24/15 184 lb (83.5 kg)  09/29/15 183 lb (83 kg)      Other studies Reviewed: Additional studies/ records that were reviewed today include:  Dr Nishans notes, recent echo    ASSESSMENT AND PLAN:  The patient has an ischemic CM (EF 20%), NYHA Class II CHF, LBBB and CAD. At this time, he meets MADIT II/ SCD-HeFT criteria for ICD implantation for  primary prevention of sudden death.  Given LBBB with QRS > 150 msec, he also meets criteria for CRT.  Risks, benefits, alternatives to  BiVICD implantation were discussed in detail with the patient today. The patient  understands that the risks include but are not limited to bleeding, infection, pneumothorax, perforation, tamponade, vascular damage, renal failure, MI, stroke, death, inappropriate shocks, and lead dislodgement and wishes to proceed.  He would prefer a Medtronic MRI compatible device.  He states that he has researched any potential conflicts with his job with [employer] and his concerns have been resolved. I will switch brilinta to plavix 1 week prior to implantdue to bleeding risks and have instructed him to hold   eliquis for 72 hours prior to the procedure.  SVT- well controlled with current medicines, no further workup planned at this time  Follow-up with Dr Nishan as scheduled  Current medicines are reviewed at length with the patient today.   The patient does not have concerns regarding his medicines.  The following changes were made today:  none   Signed, Charmane Protzman, MD  12/11/2015 10:08 AM     [facility] [address] [place] [address] [phone] (office) [phone] (fax) 

## 2015-12-11 NOTE — Patient Instructions (Addendum)
Medication Instructions:  Your physician has recommended you make the following change in your medication:  1) Start Plavix 75 mg daily on 01/03/16 2) Stop Brilinta after 01/02/16 dose   Labwork: Your physician recommends that you return for lab work on 01/05/16 at 7:30am.  You do not have to fast   Testing/Procedures:   Your physician has recommended that you have a defibrillator inserted. An implantable cardioverter defibrillator (ICD) is a small device that is placed in your chest or, in rare cases, your abdomen. This device uses electrical pulses or shocks to help control life-threatening, irregular heartbeats that could lead the heart to suddenly stop beating (sudden cardiac arrest). Leads are attached to the ICD that goes into your heart. This is done in the hospital and usually requires an overnight stay. Please see the instruction sheet given to you today for more information.---BI-V ICD implant on 01/10/16  [facility] 9[facility] Do not eat or drink after midnight the night prior to the procedure Do not take any medications the morning of procedure Use scrub as directed Plan for one night stay  Will need someone to drive you home from hospital  Hold Eliquis 3 days prior--last dose 01/06/16    Follow-Up: Your physician recommends that you schedule a follow-up appointment in: 10-14 days from 01/10/16 in the device clinic for a wound check and in 3 months from 01/10/16 with Dr Johney FrameAllred

## 2016-01-04 ENCOUNTER — Telehealth: Payer: Self-pay | Admitting: Family Medicine

## 2016-01-04 ENCOUNTER — Encounter: Payer: Self-pay | Admitting: Family Medicine

## 2016-01-04 ENCOUNTER — Ambulatory Visit (INDEPENDENT_AMBULATORY_CARE_PROVIDER_SITE_OTHER): Payer: 59 | Admitting: Family Medicine

## 2016-01-04 VITALS — BP 120/72 | HR 80 | Temp 98.6°F | Ht 69.0 in | Wt 185.4 lb

## 2016-01-04 DIAGNOSIS — R0981 Nasal congestion: Secondary | ICD-10-CM

## 2016-01-04 DIAGNOSIS — N183 Chronic kidney disease, stage 3 unspecified: Secondary | ICD-10-CM

## 2016-01-04 DIAGNOSIS — E1122 Type 2 diabetes mellitus with diabetic chronic kidney disease: Secondary | ICD-10-CM

## 2016-01-04 DIAGNOSIS — Z794 Long term (current) use of insulin: Secondary | ICD-10-CM

## 2016-01-04 DIAGNOSIS — R05 Cough: Secondary | ICD-10-CM | POA: Diagnosis not present

## 2016-01-04 DIAGNOSIS — R053 Chronic cough: Secondary | ICD-10-CM

## 2016-01-04 MED ORDER — AMOXICILLIN-POT CLAVULANATE 875-125 MG PO TABS: 1.0000 | ORAL_TABLET | Freq: Two times a day (BID) | ORAL | 0 refills | Status: DC

## 2016-01-04 NOTE — Patient Instructions (Signed)
Strong chance this is viral bronchitis. Given upcoming procedure, we did opt to cover you with 7 days of augmentin for potential bacterial bronchitis (chest congestion) vs. Sinusitis (sinus congestion). If fever or shortness of breath- see us back.

## 2016-01-04 NOTE — Telephone Encounter (Signed)
[name] notified

## 2016-01-04 NOTE — Progress Notes (Signed)
Pre visit review using our clinic review tool, if applicable. No additional management support is needed unless otherwise documented below in the visit note. 

## 2016-01-04 NOTE — Telephone Encounter (Signed)
Patient Name: John Carrillo DOB: 05/30/1950 [place] Comment Caller states he has a cold and a cough and is having a procedure. The caller is scheduled to have a procedure called CRTD on 01/10/2016. Will the caller have procedure if he is congested or coughing? Nurse Assessment Nurse: Yetta BarreJones, RN, Miranda Date/Time (Eastern Time): 01/04/2016 10:54:46 AM Confirm and document reason for call. If symptomatic, describe symptoms. ---Caller states he has a productive cough for 1-2 weeks. Denies fever Does the patient have any new or worsening symptoms? ---Yes Will a triage be completed? ---Yes Related visit to physician within the last 2 weeks? ---No Does the PT have any chronic conditions? (i.e. diabetes, asthma, etc.) ---Yes List chronic conditions. ---Cardiac, Diabetes Is this a behavioral health or substance abuse call? ---No Guidelines Guideline Title Affirmed Question Affirmed Notes Cough - Acute Productive Cough present > 10 days Final Disposition User See PCP When Office is Open (within 3 days) Yetta BarreJones, RN, Miranda Comments Appt scheduled for today at 4:15p with Dr. Durene CalHunter. Disagree/Comply: Comply

## 2016-01-04 NOTE — Progress Notes (Signed)
PCP: Tana ConchStephen Lenni Reckner, MD  Subjective:  John Carrillo is a 65 y.o. year old very pleasant male patient who presents with sinusitis symptoms including nasal congestion, persistent cough now for 3 weeks -other symptoms include: started out with URI symptoms which lasted a week but intermittent sinus congestion and cough have persisted. Admits normally has sinus congestion this time of year though and not sure very different. Primarily dry cough. Not a lot of sinus drainage.  -day of illness:over 21 days -Symptoms are improving gradually -previous treatments: Tussin-DM OTC helps.  -sick contacts/travel/risks: denies flu exposure.  -Hx of: allergies  ROS-denies fever, SOB, NVD, tooth pain  Pertinent Past Medical History-  Patient Active Problem List   Diagnosis Date Noted  . Chronic systolic CHF (congestive heart failure) (HCC) 01/25/2015    Priority: High  . Cardiomyopathy, ischemic 01/17/2015    Priority: High  . Type 2 diabetes mellitus with renal manifestations, controlled (HCC) 06/14/2011    Priority: High  . CVA (cerebral infarction) 12/19/2006    Priority: High  . CAD S/P LAD PCI'03, RCA DES 01/16/15 08/11/2006    Priority: High  . CKD (chronic kidney disease), stage III 01/17/2015    Priority: Medium  . Dyslipidemia 08/11/2006    Priority: Medium  . Essential hypertension 08/11/2006    Priority: Medium  . LBBB (left bundle branch block) 01/17/2015    Priority: Low  . SVT (supraventricular tachycardia) (HCC) 01/14/2015    Priority: Low  . Former smoker 06/24/2014    Priority: Low  . Eczema of both hands 12/15/2009    Priority: Low  . Occipital infarction (HCC) 04/04/2015  . Hypothyroidism 02/17/2015    Medications- reviewed  Current Outpatient Prescriptions  Medication Sig Dispense Refill  . acetaminophen (TYLENOL) 325 MG tablet Take 2 tablets (650 mg total) by mouth every 4 (four) hours as needed for headache or mild pain.    [name]. apixaban (ELIQUIS) 5 MG TABS tablet  Take 1 tablet (5 mg total) by mouth 2 (two) times daily. 60 tablet 11  . BRILINTA 90 MG TABS tablet Take 1 tablet by mouth 2 (two) times daily.  1  . carvedilol (COREG) 3.125 MG tablet Take 1 tablet (3.125 mg total) by mouth 2 (two) times daily with a meal. 180 tablet 3  . clopidogrel (PLAVIX) 75 MG tablet Take 1 tablet (75 mg total) by mouth daily. 90 tablet 3  . Coenzyme Q10 60 MG CAPS Take 1 capsule by mouth daily.     [name]. glucose blood (ONETOUCH VERIO) test strip Use as instructed 100 each 12  . insulin aspart (NOVOLOG FLEXPEN) 100 UNIT/ML FlexPen 3 times a day (just before each meal), 04-14-16 units, and pen needles 4/day. (Patient taking differently: 3 times a day (just before each meal), 11-18 units, and pen needles 4/day.) 15 mL 11  . Insulin Glargine (BASAGLAR KWIKPEN) 100 UNIT/ML SOPN Inject 0.16 mLs (16 Units total) into the skin 2 (two) times daily. 5 pen 11  . levothyroxine (SYNTHROID, LEVOTHROID) 50 MCG tablet Take 1 tablet (50 mcg total) by mouth daily. 30 tablet 5  . nitroGLYCERIN (NITROSTAT) 0.4 MG SL tablet Place 1 tablet (0.4 mg total) under the tongue every 5 (five) minutes as needed for chest pain. Chest pain 30 tablet 5  . Probiotic Product (PROBIOTIC PO) Take 1 capsule by mouth every morning.    . ramipril (ALTACE) 5 MG capsule Take 1 capsule (5 mg total) by mouth daily. 60 capsule 11  . rosuvastatin (CRESTOR) 40 MG  tablet Take 1 tablet (40 mg total) by mouth daily. 90 tablet 3  . spironolactone (ALDACTONE) 25 MG tablet Take 0.5 tablets (12.5 mg total) by mouth every other day. 45 tablet 11  . amoxicillin-clavulanate (AUGMENTIN) 875-125 MG tablet Take 1 tablet by mouth 2 (two) times daily. 14 tablet 0   No current facility-administered medications for this visit.     Objective: BP 120/72 (BP Location: Left Arm, Patient Position: Sitting, Cuff Size: Large)   Pulse 80   Temp 98.6 F (37 C) (Oral)   Ht 5\' 9"  (1.753 m)   Wt 185 lb 6.4 oz (84.1 kg)   SpO2 94%   BMI 27.38  kg/m  Gen: NAD, resting comfortably HEENT: Turbinates erythematous with yellow drainage, TM normal, pharynx mildly erythematous with no tonsilar exudate or edema, minimal sinus tenderness CV: RRR no murmurs rubs or gallops Lungs: CTAB no crackles, wheeze, rhonchi Skin: warm, dry, no rash Neuro: grossly normal, moves all extremities  Assessment/Plan:  Sinus congestion, persistent 3 week cough Discussed with patient greatest likelihood of viral bronchitis. Possible sinusitis bacterial given 3 weeks but symptoms of sinuses somewhat similar to years passed and not clear sinusitis. He has upcoming  CRTD Implantation planned next week. We opted to cover him for slim chance that this is bacterial bronchitis or moderate chance of bacterial sinusitis. augmentin for 7 days. Discussed benefits/risks of this- patient really does not want to postpone procedure if doesn't have to and wants to be aggressive today in abx treatment understanding risks of antibiotic overuse.   Also we discussed with poorly controlled diabetes could not use prednisone   Finally, we reviewed reasons to return to care including if symptoms worsen or persist or new concerns arise (particularly fever or shortness of breath)  Meds ordered this encounter  Medications  . amoxicillin-clavulanate (AUGMENTIN) 875-125 MG tablet    Sig: Take 1 tablet by mouth 2 (two) times daily.    Dispense:  14 tablet    Refill:  0    Tana ConchStephen Rolando Whitby, MD

## 2016-01-05 ENCOUNTER — Other Ambulatory Visit: Payer: 59 | Admitting: *Deleted

## 2016-01-05 DIAGNOSIS — I255 Ischemic cardiomyopathy: Secondary | ICD-10-CM

## 2016-01-05 LAB — CBC WITH DIFFERENTIAL/PLATELET
BASOS PCT: 0 %
Basophils Absolute: 0 cells/uL (ref 0–200)
EOS ABS: 462 {cells}/uL (ref 15–500)
EOS PCT: 6 %
HCT: 44.3 % (ref 38.5–50.0)
Hemoglobin: 14.7 g/dL (ref 13.2–17.1)
LYMPHS ABS: 1540 {cells}/uL (ref 850–3900)
Lymphocytes Relative: 20 %
MCH: 32.8 pg (ref 27.0–33.0)
MCHC: 33.2 g/dL (ref 32.0–36.0)
MCV: 98.9 fL (ref 80.0–100.0)
MONOS PCT: 16 %
MPV: 11 fL (ref 7.5–12.5)
Monocytes Absolute: 1232 cells/uL — ABNORMAL HIGH (ref 200–950)
NEUTROS ABS: 4466 {cells}/uL (ref 1500–7800)
Neutrophils Relative %: 58 %
PLATELETS: 206 10*3/uL (ref 140–400)
RBC: 4.48 MIL/uL (ref 4.20–5.80)
RDW: 13.2 % (ref 11.0–15.0)
WBC: 7.7 10*3/uL (ref 3.8–10.8)

## 2016-01-05 LAB — BASIC METABOLIC PANEL
BUN: 26 mg/dL — ABNORMAL HIGH (ref 7–25)
CO2: 28 mmol/L (ref 20–31)
CREATININE: 1.17 mg/dL (ref 0.70–1.25)
Calcium: 8.7 mg/dL (ref 8.6–10.3)
Chloride: 104 mmol/L (ref 98–110)
Glucose, Bld: 307 mg/dL — ABNORMAL HIGH (ref 65–99)
Potassium: 4.8 mmol/L (ref 3.5–5.3)
SODIUM: 139 mmol/L (ref 135–146)

## 2016-01-05 NOTE — Addendum Note (Signed)
Addended by: Tonita PhoenixBOWDEN, Elida Harbin K on: 01/05/2016 07:30 AM   Modules accepted: Orders

## 2016-01-10 ENCOUNTER — Encounter (HOSPITAL_COMMUNITY)
Admission: RE | Disposition: A | Discharge status: Home / Self Care | Payer: Self-pay | Source: Ambulatory Visit | Attending: Internal Medicine

## 2016-01-10 ENCOUNTER — Ambulatory Visit (HOSPITAL_COMMUNITY): Payer: 59

## 2016-01-10 ENCOUNTER — Encounter (HOSPITAL_COMMUNITY): Payer: Self-pay | Admitting: *Deleted

## 2016-01-10 ENCOUNTER — Ambulatory Visit (HOSPITAL_COMMUNITY)
Admission: RE | Admit: 2016-01-10 | Discharge: 2016-01-10 | Disposition: A | Discharge status: Home / Self Care | Payer: 59 | Source: Ambulatory Visit | Attending: Internal Medicine | Admitting: Internal Medicine

## 2016-01-10 DIAGNOSIS — I255 Ischemic cardiomyopathy: Secondary | ICD-10-CM | POA: Insufficient documentation

## 2016-01-10 DIAGNOSIS — I252 Old myocardial infarction: Secondary | ICD-10-CM | POA: Insufficient documentation

## 2016-01-10 DIAGNOSIS — Z9861 Coronary angioplasty status: Secondary | ICD-10-CM

## 2016-01-10 DIAGNOSIS — I5022 Chronic systolic (congestive) heart failure: Secondary | ICD-10-CM | POA: Diagnosis not present

## 2016-01-10 DIAGNOSIS — I2510 Atherosclerotic heart disease of native coronary artery without angina pectoris: Secondary | ICD-10-CM | POA: Diagnosis not present

## 2016-01-10 DIAGNOSIS — Z794 Long term (current) use of insulin: Secondary | ICD-10-CM | POA: Insufficient documentation

## 2016-01-10 DIAGNOSIS — Z8249 Family history of ischemic heart disease and other diseases of the circulatory system: Secondary | ICD-10-CM | POA: Diagnosis not present

## 2016-01-10 DIAGNOSIS — Z7902 Long term (current) use of antithrombotics/antiplatelets: Secondary | ICD-10-CM | POA: Insufficient documentation

## 2016-01-10 DIAGNOSIS — Z8673 Personal history of transient ischemic attack (TIA), and cerebral infarction without residual deficits: Secondary | ICD-10-CM | POA: Diagnosis not present

## 2016-01-10 DIAGNOSIS — I447 Left bundle-branch block, unspecified: Secondary | ICD-10-CM | POA: Diagnosis not present

## 2016-01-10 DIAGNOSIS — Z959 Presence of cardiac and vascular implant and graft, unspecified: Secondary | ICD-10-CM

## 2016-01-10 DIAGNOSIS — I130 Hypertensive heart and chronic kidney disease with heart failure and stage 1 through stage 4 chronic kidney disease, or unspecified chronic kidney disease: Secondary | ICD-10-CM | POA: Diagnosis not present

## 2016-01-10 DIAGNOSIS — Z95810 Presence of automatic (implantable) cardiac defibrillator: Secondary | ICD-10-CM

## 2016-01-10 DIAGNOSIS — Z87891 Personal history of nicotine dependence: Secondary | ICD-10-CM | POA: Insufficient documentation

## 2016-01-10 DIAGNOSIS — Z006 Encounter for examination for normal comparison and control in clinical research program: Secondary | ICD-10-CM | POA: Diagnosis not present

## 2016-01-10 DIAGNOSIS — I509 Heart failure, unspecified: Secondary | ICD-10-CM | POA: Insufficient documentation

## 2016-01-10 DIAGNOSIS — Z7901 Long term (current) use of anticoagulants: Secondary | ICD-10-CM | POA: Insufficient documentation

## 2016-01-10 DIAGNOSIS — E785 Hyperlipidemia, unspecified: Secondary | ICD-10-CM | POA: Insufficient documentation

## 2016-01-10 DIAGNOSIS — Z79899 Other long term (current) drug therapy: Secondary | ICD-10-CM | POA: Diagnosis not present

## 2016-01-10 DIAGNOSIS — I10 Essential (primary) hypertension: Secondary | ICD-10-CM

## 2016-01-10 DIAGNOSIS — I519 Heart disease, unspecified: Secondary | ICD-10-CM | POA: Diagnosis present

## 2016-01-10 DIAGNOSIS — I471 Supraventricular tachycardia: Secondary | ICD-10-CM | POA: Insufficient documentation

## 2016-01-10 DIAGNOSIS — N182 Chronic kidney disease, stage 2 (mild): Secondary | ICD-10-CM

## 2016-01-10 DIAGNOSIS — N189 Chronic kidney disease, unspecified: Secondary | ICD-10-CM | POA: Insufficient documentation

## 2016-01-10 DIAGNOSIS — I6340 Cerebral infarction due to embolism of unspecified cerebral artery: Secondary | ICD-10-CM

## 2016-01-10 DIAGNOSIS — E1122 Type 2 diabetes mellitus with diabetic chronic kidney disease: Secondary | ICD-10-CM | POA: Insufficient documentation

## 2016-01-10 HISTORY — PX: EP IMPLANTABLE DEVICE: SHX172B

## 2016-01-10 LAB — GLUCOSE, CAPILLARY
GLUCOSE-CAPILLARY: 133 mg/dL — AB (ref 65–99)
GLUCOSE-CAPILLARY: 41 mg/dL — AB (ref 65–99)
Glucose-Capillary: 288 mg/dL — ABNORMAL HIGH (ref 65–99)
Glucose-Capillary: 449 mg/dL — ABNORMAL HIGH (ref 65–99)

## 2016-01-10 LAB — SURGICAL PCR SCREEN
MRSA, PCR: NEGATIVE
Staphylococcus aureus: NEGATIVE

## 2016-01-10 SURGERY — BIV ICD INSERTION CRT-D

## 2016-01-10 MED ORDER — LEVOTHYROXINE SODIUM 50 MCG PO TABS: 50.0000 ug | ORAL_TABLET | Freq: Every day | ORAL | Status: DC

## 2016-01-10 MED ORDER — LIDOCAINE HCL (PF) 1 % IJ SOLN
INTRAMUSCULAR | Status: DC | PRN
  Administered 2016-01-10: 50 mL

## 2016-01-10 MED ORDER — CEFAZOLIN SODIUM-DEXTROSE 2-4 GM/100ML-% IV SOLN
2.0000 g | INTRAVENOUS | Status: AC
  Administered 2016-01-10: 2 g via INTRAVENOUS
  Filled 2016-01-10: qty 100

## 2016-01-10 MED ORDER — APIXABAN 5 MG PO TABS: 5.0000 mg | ORAL_TABLET | Freq: Two times a day (BID) | ORAL | 11 refills | Status: DC

## 2016-01-10 MED ORDER — IOPAMIDOL (ISOVUE-370) INJECTION 76%
INTRAVENOUS | Status: DC | PRN
  Administered 2016-01-10: 8 mL via INTRAVENOUS

## 2016-01-10 MED ORDER — FENTANYL CITRATE (PF) 100 MCG/2ML IJ SOLN
INTRAMUSCULAR | Status: DC | PRN
  Administered 2016-01-10 (×3): 12.5 ug via INTRAVENOUS

## 2016-01-10 MED ORDER — LIDOCAINE HCL (PF) 1 % IJ SOLN
INTRAMUSCULAR | Status: AC
  Filled 2016-01-10: qty 60

## 2016-01-10 MED ORDER — CEFAZOLIN SODIUM-DEXTROSE 2-4 GM/100ML-% IV SOLN
INTRAVENOUS | Status: AC
  Filled 2016-01-10: qty 100

## 2016-01-10 MED ORDER — INSULIN GLARGINE 100 UNIT/ML ~~LOC~~ SOLN
16.0000 [IU] | Freq: Two times a day (BID) | SUBCUTANEOUS | Status: DC
  Filled 2016-01-10 (×2): qty 0.16

## 2016-01-10 MED ORDER — CLOPIDOGREL BISULFATE 75 MG PO TABS
75.0000 mg | ORAL_TABLET | Freq: Every day | ORAL | Status: DC
Start: 2016-01-11 — End: 2016-01-10

## 2016-01-10 MED ORDER — SODIUM CHLORIDE 0.9 % IV SOLN: 250.0000 mL | INTRAVENOUS | Status: DC | PRN

## 2016-01-10 MED ORDER — SPIRONOLACTONE 25 MG PO TABS: 12.5000 mg | ORAL_TABLET | ORAL | Status: DC

## 2016-01-10 MED ORDER — SODIUM CHLORIDE 0.9% FLUSH
3.0000 mL | Freq: Two times a day (BID) | INTRAVENOUS | Status: DC
  Administered 2016-01-10: 3 mL via INTRAVENOUS

## 2016-01-10 MED ORDER — HEPARIN (PORCINE) IN NACL 2-0.9 UNIT/ML-% IJ SOLN
INTRAMUSCULAR | Status: DC | PRN
  Administered 2016-01-10: 09:00:00

## 2016-01-10 MED ORDER — SODIUM CHLORIDE 0.9% FLUSH: 3.0000 mL | INTRAVENOUS | Status: DC | PRN

## 2016-01-10 MED ORDER — MIDAZOLAM HCL 5 MG/5ML IJ SOLN
INTRAMUSCULAR | Status: DC | PRN
  Administered 2016-01-10 (×3): 1 mg via INTRAVENOUS

## 2016-01-10 MED ORDER — RAMIPRIL 5 MG PO CAPS: 5.0000 mg | ORAL_CAPSULE | Freq: Every day | ORAL | Status: DC

## 2016-01-10 MED ORDER — GENTAMICIN SULFATE 40 MG/ML IJ SOLN
INTRAMUSCULAR | Status: AC
  Filled 2016-01-10: qty 2

## 2016-01-10 MED ORDER — CHLORHEXIDINE GLUCONATE 4 % EX LIQD
60.0000 mL | Freq: Once | CUTANEOUS | Status: DC
  Filled 2016-01-10: qty 60

## 2016-01-10 MED ORDER — ONDANSETRON HCL 4 MG/2ML IJ SOLN: 4.0000 mg | Freq: Four times a day (QID) | INTRAMUSCULAR | Status: DC | PRN

## 2016-01-10 MED ORDER — ACETAMINOPHEN 325 MG PO TABS: 325.0000 mg | ORAL_TABLET | ORAL | Status: DC | PRN

## 2016-01-10 MED ORDER — VERAPAMIL HCL 2.5 MG/ML IV SOLN
INTRAVENOUS | Status: AC
  Filled 2016-01-10: qty 2

## 2016-01-10 MED ORDER — HEPARIN (PORCINE) IN NACL 2-0.9 UNIT/ML-% IJ SOLN
INTRAMUSCULAR | Status: AC
  Filled 2016-01-10: qty 500

## 2016-01-10 MED ORDER — IOPAMIDOL (ISOVUE-370) INJECTION 76%
INTRAVENOUS | Status: AC
  Filled 2016-01-10: qty 50

## 2016-01-10 MED ORDER — CARVEDILOL 3.125 MG PO TABS
3.1250 mg | ORAL_TABLET | Freq: Two times a day (BID) | ORAL | Status: DC
Start: 2016-01-10 — End: 2016-01-10

## 2016-01-10 MED ORDER — CEFAZOLIN IN D5W 1 GM/50ML IV SOLN
1.0000 g | Freq: Four times a day (QID) | INTRAVENOUS | Status: DC
  Administered 2016-01-10: 1 g via INTRAVENOUS
  Filled 2016-01-10 (×3): qty 50

## 2016-01-10 MED ORDER — MIDAZOLAM HCL 5 MG/5ML IJ SOLN
INTRAMUSCULAR | Status: AC
  Filled 2016-01-10: qty 5

## 2016-01-10 MED ORDER — MUPIROCIN 2 % EX OINT
1.0000 "application " | TOPICAL_OINTMENT | Freq: Once | CUTANEOUS | Status: AC
  Administered 2016-01-10: 1 via TOPICAL
  Filled 2016-01-10: qty 22

## 2016-01-10 MED ORDER — HYDROCODONE-ACETAMINOPHEN 5-325 MG PO TABS: 1.0000 | ORAL_TABLET | ORAL | Status: DC | PRN

## 2016-01-10 MED ORDER — GENTAMICIN SULFATE 40 MG/ML IJ SOLN
80.0000 mg | INTRAMUSCULAR | Status: AC
  Administered 2016-01-10: 80 mg
  Filled 2016-01-10: qty 2

## 2016-01-10 MED ORDER — SODIUM CHLORIDE 0.9 % IV SOLN
INTRAVENOUS | Status: DC
  Administered 2016-01-10: 08:00:00 via INTRAVENOUS

## 2016-01-10 MED ORDER — MUPIROCIN 2 % EX OINT
TOPICAL_OINTMENT | CUTANEOUS | Status: AC
  Administered 2016-01-10: 1 via TOPICAL
  Filled 2016-01-10: qty 22

## 2016-01-10 MED ORDER — FENTANYL CITRATE (PF) 100 MCG/2ML IJ SOLN
INTRAMUSCULAR | Status: AC
  Filled 2016-01-10: qty 2

## 2016-01-10 SURGICAL SUPPLY — 17 items
ADAPTER SEALING SSA-EW-09 (MISCELLANEOUS) ×2 IMPLANT
ADPR INTRO LNG 9FR SL XTD WNG (MISCELLANEOUS) ×1
CABLE SURGICAL S-101-97-12 (CABLE) ×2 IMPLANT
CATH ATTAIN COM SURV 6250V-MB2 (CATHETERS) ×2 IMPLANT
CATH HEXAPOLAR DAMATO 6F (CATHETERS) ×2 IMPLANT
ICD CLARIA MRI DTMA1QQ (ICD Generator) ×2 IMPLANT
KIT ESSENTIALS PG (KITS) ×2 IMPLANT
LEAD ATTAIN PERFORMA S 4598-88 (Lead) ×2 IMPLANT
LEAD CAPSURE NOVUS 5076-52CM (Lead) ×2 IMPLANT
LEAD SPRINT QUAT SEC 6935M-62 (Lead) ×2 IMPLANT
PAD DEFIB LIFELINK (PAD) ×2 IMPLANT
SHEATH CLASSIC 7F (SHEATH) ×2 IMPLANT
SHEATH CLASSIC 9.5F (SHEATH) ×2 IMPLANT
SHEATH CLASSIC 9F (SHEATH) ×2 IMPLANT
SLITTER 6232ADJ (MISCELLANEOUS) ×2 IMPLANT
TRAY PACEMAKER INSERTION (PACKS) ×2 IMPLANT
WIRE ACUITY WHISPER EDS 4648 (WIRE) ×2 IMPLANT

## 2016-01-10 NOTE — Discharge Instructions (Signed)
° ° °  Supplemental Discharge Instructions for  Defibrillator Patients  Activity No heavy lifting or vigorous activity with your left/right arm for 6 to 8 weeks.  Do not raise your left/right arm above your head for one week.     NO DRIVING for 1 week  WOUND CARE - Keep the wound area clean and dry.  Do not get this area wet for one week. No showers for one week  - The tape/steri-strips on your wound will fall off; do not pull them off.  No bandage is needed on the site.  DO  NOT apply any creams, oils, or ointments to the wound area. - If you notice any drainage or discharge from the wound, any swelling or bruising at the site, or you develop a fever > 101? F after you are discharged home, call the office at once.  Special Instructions - You are still able to use cellular telephones; use the ear opposite the side where you have your pacemaker/defibrillator.  Avoid carrying your cellular phone near your device. - When traveling through airports, show security personnel your identification card to avoid being screened in the metal detectors.  Ask the security personnel to use the hand wand. - Avoid arc welding equipment, MRI testing (magnetic resonance imaging), TENS units (transcutaneous nerve stimulators).  Call the office for questions about other devices. - Avoid electrical appliances that are in poor condition or are not properly grounded. - Microwave ovens are safe to be near or to operate.  Additional information for defibrillator patients should your device go off: - If your device goes off ONCE and you feel fine afterward, notify the device clinic nurses. - If your device goes off ONCE and you do not feel well afterward, call 911. - If your device goes off TWICE, call 911. - If your device goes off THREE times in one day, call 911.  DO NOT DRIVE YOURSELF OR A FAMILY MEMBER WITH A DEFIBRILLATOR TO THE HOSPITAL--CALL 911.   Resume eliquis Monday 01/15/16

## 2016-01-10 NOTE — Progress Notes (Signed)
HYPOGLYCEMIC EVENT  CBG: 41  SYMPTOMs -mildly aware of symptoms-80z orange juice-peanutbutter/grahm crackers/turkey sandwich  FOLLOW-UP CBG:    Time:  1125 CBG Result: 133  POSSIBLE REASONS FOR EVENT: NPO  COMMENTS/MD NOTIFIED:  yes

## 2016-01-10 NOTE — Progress Notes (Signed)
Inpatient Diabetes Program Recommendations  AACE/ADA: New Consensus Statement on Inpatient Glycemic Control (2015)  Target Ranges:  Prepandial:   less than 140 mg/dL      Peak postprandial:   less than 180 mg/dL (1-2 hours)      Critically ill patients:  140 - 180 mg/dL   Lab Results  Component Value Date   GLUCAP 288 (H) 01/10/2016   HGBA1C 8.2 09/29/2015   Results for Jethro BolusMORRIS, Tage A (MRN [id number]) as of 01/10/2016 09:33  Ref. Range 05/17/2015 06:57 01/10/2016 06:52  Glucose-Capillary Latest Ref Range: 65 - 99 mg/dL 409227 (H) 811288 (H)   Review of Glycemic Control  Diabetes history: DM1 Outpatient Diabetes medications: Novolog 11-18 units QID before meals, Basaglar 16 units BID Current orders for Inpatient glycemic control: None  Inpatient Diabetes Program Recommendations:      Please consider giving basal insulin if patient misses home dose while in procedure.  Thank you,  Kristine LineaKaren Pearce Littlefield, RN, BSN Diabetes Coordinator Inpatient Diabetes Program [phone] (Team Pager)

## 2016-01-10 NOTE — Interval H&P Note (Signed)
History and Physical Interval Note:  01/10/2016 7:29 AM  John Carrillo  has presented today for surgery, with the diagnosis of cm  The various methods of treatment have been discussed with the patient and family. After consideration of risks, benefits and other options for treatment, the patient has consented to  Procedure(s): BiV ICD Insertion CRT-D (N/A) as a surgical intervention .  The patient's history has been reviewed, patient examined, no change in status, stable for surgery.  I have reviewed the patient's chart and labs.  Questions were answered to the patient's satisfaction.    He is no longer on brilinta.  Taking plavix daily.  He has held eliquis for more than 48 hours.  On eliquis without afib or prior dvt.  ICD Criteria  Current LVEF:20%. Within 12 months prior to implant: Yes   Heart failure history: Yes, Class II  Cardiomyopathy history: Yes, Ischemic Cardiomyopathy - Prior MI.  Atrial Fibrillation/Atrial Flutter: No.  Ventricular tachycardia history: No.  Cardiac arrest history: No.  History of syndromes with risk of sudden death: No.  Previous ICD: No.  Current ICD indication: Primary  PPM indication:  Yes BiV ICD  Beta Blocker therapy for 3 or more months: Yes, prescribed.   Ace Inhibitor/ARB therapy for 3 or more months: Yes, prescribed.      John Carrillo

## 2016-01-10 NOTE — H&P (View-Only) (Signed)
Electrophysiology Office Note   Date:  12/11/2015   ID:  John BolusStephen A Staubs, DOB 1950/12/02, MRN [id number]  PCP:  Tana ConchStephen Hunter, MD  Cardiologist:  Dr [name] Primary Electrophysiologist: Hillis RangeJames Eithan Beagle, MD    Chief Complaint  Patient presents with  . Follow-up    ICM     History of Present Illness: John Carrillo is a 65 y.o. male who presents today for electrophysiology evaluation.   The patient has an ischemic CM (EF 20-25%) and NYHA Class II CHF.  He has LBBB. [facility]17 with ACS in the setting of SVT. Catheterization demonstrated 95% stenosed mid RCA to distal RCA lesion that was successfully intervened on. LVEF 25% by cath. He had subsequent stroke.  He is on brilinta post PCI and has also been started on eliquis without having afib diagnosed.  He has been treated with an optimal medical regimen however EF remains depressed.  He denies any further SVT.  He has resumed normal activity.      Past Medical History:  Diagnosis Date  . Acute kidney injury superimposed on chronic kidney disease (HCC) 01/25/2015  . CORONARY ARTERY DISEASE 08/11/2006  . CVA 12/19/2006   2008  . DIABETES MELLITUS, TYPE I 08/11/2006  . ECZEMA, HANDS 12/15/2009  . HYPERLIPIDEMIA 08/11/2006  . HYPERTENSION 08/11/2006  . MYOCARDIAL INFARCTION, HX OF 08/11/2006   07/14/01  . NSTEMI (non-ST elevated myocardial infarction) (HCC) 01/15/2015   Past Surgical History:  Procedure Laterality Date  . CARDIAC CATHETERIZATION N/A 01/16/2015   Procedure: Left Heart Cath and Coronary Angiography;  Surgeon: Runell GessJonathan J Berry, MD;  [facility];  Service: Cardiovascular;  Laterality: N/A;  . CORONARY STENT PLACEMENT    . TEE WITHOUT CARDIOVERSION N/A 01/26/2015   Procedure: TRANSESOPHAGEAL ECHOCARDIOGRAM (TEE);  Surgeon: Thurmon FairMihai Croitoru, MD;  [facility];  Service: Cardiovascular;  Laterality: N/A;  . TONSILECTOMY, ADENOIDECTOMY, BILATERAL MYRINGOTOMY AND TUBES        Current Outpatient Prescriptions  Medication Sig Dispense Refill  . acetaminophen (TYLENOL) 325 MG tablet Take 2 tablets (650 mg total) by mouth every 4 (four) hours as needed for headache or mild pain.    [name]. apixaban (ELIQUIS) 5 MG TABS tablet Take 1 tablet (5 mg total) by mouth 2 (two) times daily. 60 tablet 11  . carvedilol (COREG) 3.125 MG tablet Take 1 tablet (3.125 mg total) by mouth 2 (two) times daily with a meal. 180 tablet 3  . Coenzyme Q10 60 MG CAPS Take 1 capsule by mouth daily.     [name]. glucose blood (ONETOUCH VERIO) test strip Use as instructed 100 each 12  . insulin aspart (NOVOLOG FLEXPEN) 100 UNIT/ML FlexPen 3 times a day (just before each meal), 04-14-16 units, and pen needles 4/day. 15 mL 11  . Insulin Glargine (BASAGLAR KWIKPEN) 100 UNIT/ML SOPN Inject 0.16 mLs (16 Units total) into the skin 2 (two) times daily. 5 pen 11  . levothyroxine (SYNTHROID, LEVOTHROID) 50 MCG tablet Take 1 tablet (50 mcg total) by mouth daily. 30 tablet 5  . nitroGLYCERIN (NITROSTAT) 0.4 MG SL tablet Place 1 tablet (0.4 mg total) under the tongue every 5 (five) minutes as needed for chest pain. Chest pain 30 tablet 5  . Probiotic Product (PROBIOTIC PO) Take 1 capsule by mouth every morning.    . ramipril (ALTACE) 5 MG capsule Take 1 capsule (5 mg total) by mouth daily. 60 capsule 11  . rosuvastatin (CRESTOR) 40 MG tablet Take 1 tablet (  40 mg total) by mouth daily. 90 tablet 3  . spironolactone (ALDACTONE) 25 MG tablet Take 0.5 tablets (12.5 mg total) by mouth every other day. 45 tablet 11  . ticagrelor (BRILINTA) 90 MG TABS tablet Take 1 tablet (90 mg total) by mouth 2 (two) times daily. 60 tablet 11   No current facility-administered medications for this visit.     Allergies:   Patient has no known allergies.   Social History:  The patient  reports that he quit smoking about 15 years ago. His smoking use included Cigarettes. He has a 30.00 pack-year smoking history. He has never used smokeless  tobacco. He reports that he drinks about 1.8 oz of alcohol per week . He reports that he does not use drugs.   Family History:  The patient's  family history includes Brain cancer in his maternal grandmother; CAD in his father, paternal grandfather, and paternal grandmother.    ROS:  Please see the history of present illness.   All other systems are reviewed and negative.    PHYSICAL EXAM: VS:  BP 130/82   Pulse 61   Ht 5\' 9"  (1.753 m)   Wt 190 lb 6.4 oz (86.4 kg)   BMI 28.12 kg/m  , BMI Body mass index is 28.12 kg/m. GEN: Well nourished, well developed, in no acute distress  HEENT: normal  Neck: no JVD, carotid bruits, or masses Cardiac: RRR; no murmurs, rubs, or gallops,no edema  Respiratory:  clear to auscultation bilaterally, normal work of breathing GI: soft, nontender, nondistended, + BS MS: no deformity or atrophy  Skin: warm and dry  Neuro:  Strength and sensation are intact Psych: euthymic mood, full affect  EKG:  EKG is ordered today. The ekg ordered today shows sinus rhythm 61 bpm, PR 216 msec, LBBB (QRS 168 msec)   Recent Labs: 02/17/2015: TSH 12.19 03/17/2015: Hemoglobin 14.2; Platelets 198 09/29/2015: ALT 31; Brain Natriuretic Peptide 136.5; BUN 24; Creat 1.34; Potassium 4.5; Sodium 141    Lipid Panel     Component Value Date/Time   CHOL 149 09/29/2015 0814   TRIG 88 09/29/2015 0814   HDL 65 09/29/2015 0814   CHOLHDL 2.3 09/29/2015 0814   VLDL 18 09/29/2015 0814   LDLCALC 66 09/29/2015 0814   LDLDIRECT 90.0 02/17/2015 0816     Wt Readings from Last 3 Encounters:  12/11/15 190 lb 6.4 oz (86.4 kg)  11/24/15 184 lb (83.5 kg)  09/29/15 183 lb (83 kg)      Other studies Reviewed: Additional studies/ records that were reviewed today include:  Dr Ricki MillerNishans notes, recent echo    ASSESSMENT AND PLAN:  The patient has an ischemic CM (EF 20%), NYHA Class II CHF, LBBB and CAD. At this time, he meets MADIT II/ SCD-HeFT criteria for ICD implantation for  primary prevention of sudden death.  Given LBBB with QRS > 150 msec, he also meets criteria for CRT.  Risks, benefits, alternatives to  BiVICD implantation were discussed in detail with the patient today. The patient  understands that the risks include but are not limited to bleeding, infection, pneumothorax, perforation, tamponade, vascular damage, renal failure, MI, stroke, death, inappropriate shocks, and lead dislodgement and wishes to proceed.  He would prefer a Medtronic MRI compatible device.  He states that he has researched any potential conflicts with his job with [employer] and his concerns have been resolved. I will switch brilinta to plavix 1 week prior to implantdue to bleeding risks and have instructed him to hold  eliquis for 72 hours prior to the procedure.  SVT- well controlled with current medicines, no further workup planned at this time  Follow-up with Dr [name] as scheduled  Current medicines are reviewed at length with the patient today.   The patient does not have concerns regarding his medicines.  The following changes were made today:  none   Signed, Hillis Range, MD  12/11/2015 10:08 AM     [facility] [address] [place] [address] [phone] (office) [phone] (fax)

## 2016-01-10 NOTE — Discharge Summary (Signed)
ELECTROPHYSIOLOGY PROCEDURE DISCHARGE SUMMARY    Patient ID: John Carrillo,  MRN: [id number], DOB/AGE: [birth year] 65 y.o.  Admit date: 01/10/2016 Discharge date: 01/10/2016   Primary Discharge Diagnosis:  Ischemic CM  Secondary Discharge Diagnosis:  Chronic systolic dysfunction LBBB Diabetes CAD  No Known Allergies   Procedures This Admission:  1.  Implantation of a MDT Claria BiV ICD by Dr Johney FrameAllred.    There were no immediate post procedure complications. 2.  CXR demonstrated no pneumothorax status post device implantation.   Brief HPI: John Carrillo is a 65 y.o. male was referred to electrophysiology in the outpatient setting for consideration of ICD implantation.  Past medical history includes ischemic CM, CAD and LBBB.  The patient has persistent LV dysfunction despite guideline directed therapy.  Risks, benefits, and alternatives to ICD implantation were reviewed with the patient who wished to proceed.   Hospital Course:  The patient was admitted and underwent implantation of a BiV ICD with details as outlined above. He was monitored on telemetry which demonstrated sinus rhythm with BiV Pacing.  Left chest was without hematoma or ecchymosis.  The device was interrogated and found to be functioning normally.  CXR was obtained and demonstrated no pneumothorax status post device implantation.  Wound care, arm mobility, and restrictions were reviewed with the patient.  The patient was examined and considered stable for discharge to home.   The patient's discharge medications include an ACE-I and beta blocker.   Physical Exam: Vitals:   01/10/16 1350 01/10/16 1405 01/10/16 1435 01/10/16 1505  BP: 134/82 (!) 145/89 (!) 138/109 (!) 133/91  Pulse: 66 73 78 78  Resp: 18 18 17 20   Temp:      TempSrc:      SpO2: 96% 96% 96% 94%  Weight:      Height:        GEN- The patient is well appearing, alert and oriented x 3 today.   HEENT: normocephalic, atraumatic; sclera  clear, conjunctiva pink; hearing intact; oropharynx clear; neck supple, no JVP Lymph- no cervical lymphadenopathy Lungs- Clear to ausculation bilaterally, normal work of breathing.  No wheezes, rales, rhonchi Heart- Regular rate and rhythm, no murmurs, rubs or gallops, PMI not laterally displaced GI- soft, non-tender, non-distended, bowel sounds present, no hepatosplenomegaly Extremities- no clubbing, cyanosis, or edema; DP/PT/radial pulses 2+ bilaterally MS- no significant deformity or atrophy Skin- warm and dry, no rash or lesion, left chest without hematoma/ecchymosis Psych- euthymic mood, full affect Neuro- strength and sensation are intact   Labs:   Lab Results  Component Value Date   WBC 7.7 01/05/2016   HGB 14.7 01/05/2016   HCT 44.3 01/05/2016   MCV 98.9 01/05/2016   PLT 206 01/05/2016    Recent Labs Lab 01/05/16 0730  NA 139  K 4.8  CL 104  CO2 28  BUN 26*  CREATININE 1.17  CALCIUM 8.7  GLUCOSE 307*    Discharge Medications:    Medication List    TAKE these medications   acetaminophen 325 MG tablet Commonly known as:  TYLENOL Take 2 tablets (650 mg total) by mouth every 4 (four) hours as needed for headache or mild pain.   amoxicillin-clavulanate 875-125 MG tablet Commonly known as:  AUGMENTIN Take 1 tablet by mouth 2 (two) times daily.   apixaban 5 MG Tabs tablet Commonly known as:  ELIQUIS Take 1 tablet (5 mg total) by mouth 2 (two) times daily. Start taking on:  01/15/2016   [facility] 100  UNIT/ML Sopn Inject 0.16 mLs (16 Units total) into the skin 2 (two) times daily.   carvedilol 3.125 MG tablet Commonly known as:  COREG Take 1 tablet (3.125 mg total) by mouth 2 (two) times daily with a meal.   clopidogrel 75 MG tablet Commonly known as:  PLAVIX Take 1 tablet (75 mg total) by mouth daily.   Coenzyme Q10 60 MG Caps Take 1 capsule by mouth daily.   glucose blood test strip Commonly known as:  ONETOUCH VERIO Use as instructed     insulin aspart 100 UNIT/ML FlexPen Commonly known as:  NOVOLOG FLEXPEN 3 times a day (just before each meal), 04-14-16 units, and pen needles 4/day. What changed:  additional instructions   levothyroxine 50 MCG tablet Commonly known as:  SYNTHROID, LEVOTHROID Take 1 tablet (50 mcg total) by mouth daily.   nitroGLYCERIN 0.4 MG SL tablet Commonly known as:  NITROSTAT Place 1 tablet (0.4 mg total) under the tongue every 5 (five) minutes as needed for chest pain. Chest pain   PROBIOTIC PO Take 1 capsule by mouth every morning.   ramipril 5 MG capsule Commonly known as:  ALTACE Take 1 capsule (5 mg total) by mouth daily.   rosuvastatin 40 MG tablet Commonly known as:  CRESTOR Take 1 tablet (40 mg total) by mouth daily.   spironolactone 25 MG tablet Commonly known as:  ALDACTONE Take 0.5 tablets (12.5 mg total) by mouth every other day.       Disposition:   Follow-up Information    [facility] [address] [facility]2016.   Specialty:  Cardiology Why:  11 am Contact information: [address], [address] [place] [id number] [phone]       Hillis RangeJames Johnothan Bascomb, MD Follow up on 04/10/2016.   Specialty:  Cardiology Why:  9:30 am Contact information: [address] [place] [address] [phone]          He has been placed on eliquis for prior stroke without documented afib. Will monitor with BiV ICD going forward.  If no AF on follow-up with EP NP in 6 weeks, would stop eliquis at that time.    Duration of Discharge Encounter: Greater than 30 minutes including physician time.  Randolm IdolSigned, Maci Eickholt MD 01/10/2016 4:43 PM

## 2016-01-11 ENCOUNTER — Encounter (HOSPITAL_COMMUNITY): Payer: Self-pay | Admitting: Internal Medicine

## 2016-01-22 ENCOUNTER — Ambulatory Visit (INDEPENDENT_AMBULATORY_CARE_PROVIDER_SITE_OTHER): Payer: 59 | Admitting: *Deleted

## 2016-01-22 DIAGNOSIS — I255 Ischemic cardiomyopathy: Secondary | ICD-10-CM | POA: Diagnosis not present

## 2016-01-22 NOTE — Progress Notes (Signed)
Wound check appointment. Steri-strips removed. Wound without redness or edema. Lateral left incicison edge superficially un-approximated. 3 Steri-Strips applied. Normal device function. Thresholds, sensing, and impedances consistent with implant measurements. Device programmed at 3.5V for extra safety margin until 3 month visit. Histogram distribution appropriate for patient and level of activity. No mode switches or ventricular arrhythmias noted. Patient educated about wound care, arm mobility, lifting restrictions, shock plan. ROV w/ device clinic 12/28 fro wound re-check. 03/01/2016 w/ AS

## 2016-02-01 ENCOUNTER — Ambulatory Visit (INDEPENDENT_AMBULATORY_CARE_PROVIDER_SITE_OTHER): Payer: 59 | Admitting: *Deleted

## 2016-02-01 DIAGNOSIS — Z95810 Presence of automatic (implantable) cardiac defibrillator: Secondary | ICD-10-CM

## 2016-02-01 LAB — CUP PACEART INCLINIC DEVICE CHECK
Implantable Lead Implant Date: 20171206
Implantable Lead Location: 753859
Implantable Lead Location: 753860
Implantable Lead Model: 5076
MDC IDC LEAD IMPLANT DT: 20171206
MDC IDC LEAD IMPLANT DT: 20171206
MDC IDC LEAD LOCATION: 753858
MDC IDC PG IMPLANT DT: 20171206
MDC IDC SESS DTM: 20171228165639

## 2016-02-01 NOTE — Progress Notes (Signed)
Wound re-check for left lateral incision unapproximation 01/22/16. Steri-strips removed. Wound well healed, edges approximated. [name] was educated about wound care and activity restrictions and knows to call the device clinic with any redness, swelling or drainage from incision. ROV with AS 03/01/16.

## 2016-02-02 ENCOUNTER — Other Ambulatory Visit: Payer: Self-pay | Admitting: Cardiology

## 2016-02-02 NOTE — Telephone Encounter (Signed)
Rx has been sent to the pharmacy electronically. ° °

## 2016-02-09 ENCOUNTER — Encounter: Payer: 59 | Admitting: Nurse Practitioner

## 2016-02-11 NOTE — Progress Notes (Signed)
Subjective:    Patient ID: John Carrillo, male    DOB: 1950/05/17, 66 y.o.   MRN: [id number]  HPI Pt returns for f/u of diabetes mellitus: DM type: 1 Dx'ed: 2008. Complications: CVA, PAD, renal insufficiency, and CAD. Therapy: insulin since soon after dx.  DKA: never Severe hypoglycemia: never. Pancreatitis: never.   Other: he declines [id number] ump, including V-GO; he takes multiple daily injections; He says his ability to care for his DM has been compromised by wife's illness Interval history: He has mild hypoglycemia approx 3 times per week.  This usually happens at lunch, when he takes too much novolog at breakfast.  pt states he feels well in general, except for headache.  Past Medical History:  Diagnosis Date  . Acute kidney injury superimposed on chronic kidney disease ([place]) 01/25/2015  . CORONARY ARTERY DISEASE 08/11/2006  . CVA 12/19/2006   2008  . DIABETES MELLITUS, TYPE I 08/11/2006  . ECZEMA, HANDS 12/15/2009  . HYPERLIPIDEMIA 08/11/2006  . HYPERTENSION 08/11/2006  . MYOCARDIAL INFARCTION, HX OF 08/11/2006   07/14/01  . NSTEMI (non-ST elevated myocardial infarction) ([place]) 01/15/2015    Past Surgical History:  Procedure Laterality Date  . CARDIAC CATHETERIZATION N/A 01/16/2015   Procedure: Left Heart Cath and Coronary Angiography;  Surgeon: Lorretta Harp, MD;  Location: [place] [facility];  Service: Cardiovascular;  Laterality: N/A;  . CORONARY STENT PLACEMENT    . EP IMPLANTABLE DEVICE N/A 01/10/2016   Procedure: BiV ICD Insertion CRT-D;  Surgeon: Thompson Grayer, MD;  Location: [place] [facility] LAB;  Service: Cardiovascular;  Laterality: N/A;  . TEE WITHOUT CARDIOVERSION N/A 01/26/2015   Procedure: TRANSESOPHAGEAL ECHOCARDIOGRAM (TEE);  Surgeon: Sanda Klein, MD;  Location: [facility] ENDOSCOPY;  Service: Cardiovascular;  Laterality: N/A;  . TONSILECTOMY, ADENOIDECTOMY, BILATERAL MYRINGOTOMY AND TUBES      Social History   Social History  . Marital status: Married   Spouse name: N/A  . Number of children: N/A  . Years of education: N/A   Occupational History  . Occupational hygienist   Social History Main Topics  . Smoking status: Former Smoker    Packs/day: 1.00    Years: 30.00    Types: Cigarettes    Quit date: 05/03/2000  . Smokeless tobacco: Never Used  . Alcohol use 1.8 oz/week    1 Glasses of wine, 1 Cans of beer, 1 Shots of liquor per week     Comment: 1-2/month  . Drug use: No  . Sexual activity: Not on file   Other Topics Concern  . Not on file   Social History Narrative   Married (kids are patients here, wife seen elsewhere). 3 step children, 1 biological. No grandkids.    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 acetaminophen (TYLENOL) 325 MG tablet Take 2 tablets (650 mg total) by mouth every 4 (four) hours as needed for headache or mild pain.    [name] amoxicillin-clavulanate (AUGMENTIN) 875-125 MG tablet Take 1 tablet by mouth 2 (two) times daily. 14 tablet 0  . apixaban (ELIQUIS) 5 MG TABS tablet Take 1 tablet (5 mg total) by mouth 2 (two) times daily.  60 tablet 11  . carvedilol (COREG) 3.125 MG tablet Take 1 tablet (3.125 mg total) by mouth 2 (two) times daily with a meal. 180 tablet 3  . clopidogrel (PLAVIX) 75 MG tablet Take 1 tablet (75 mg total) by mouth daily. 90 tablet 3  . Coenzyme Q10 60 MG CAPS Take 1 capsule by mouth daily.     [name] glucose blood (ONETOUCH VERIO) test strip Use as instructed 100 each 12  . insulin aspart (NOVOLOG FLEXPEN) 100 UNIT/ML FlexPen 3 times a day (just before each meal), 04-14-16 units, and pen needles 4/day. (Patient taking differently: 3 times a day (just before each meal),  11-18 units, and pen needles 4/day.) 15 mL 11  . Insulin Glargine (BASAGLAR KWIKPEN) 100 UNIT/ML SOPN Inject 0.16 mLs (16 Units total) into the skin 2 (two) times daily. 5 pen 11  . nitroGLYCERIN (NITROSTAT) 0.4 MG SL tablet Place 1 tablet (0.4 mg total) under the tongue every 5 (five) minutes as needed for chest pain. Chest pain 30 tablet 5  . Probiotic Product (PROBIOTIC PO) Take 1 capsule by mouth every morning.    . ramipril (ALTACE) 5 MG capsule Take 1 capsule (5 mg total) by mouth daily. 60 capsule 11  . rosuvastatin (CRESTOR) 40 MG tablet Take 1 tablet (40 mg total) by mouth daily. 90 tablet 3  . spironolactone (ALDACTONE) 25 MG tablet Take 0.5 tablets (12.5 mg total) by mouth every other day. 45 tablet 11   No current facility-administered medications on file prior to visit.     No Known Allergies  Family History  Problem Relation Age of Onset  . CAD Father     age 78  . CAD Paternal Grandfather     43s  . CAD Paternal Grandmother     73s  . Brain cancer Maternal Grandmother   . Diabetes Neg Hx     BP 132/84   Pulse 70   Ht 5' 9"  (1.753 m)   Wt 189 lb (85.7 kg)   SpO2 94%   BMI 27.91 kg/m    Review of Systems Denies LOC.      Objective:   Physical Exam VITAL SIGNS:  See vs page GENERAL: no distress Pulses: dorsalis pedis intact bilat.   MSK: no deformity of the feet CV: no leg edema Skin:  no ulcer on the feet.  normal color and temp on the feet. Neuro: sensation is intact to touch on the feet  A1c=8.2% Lab Results  Component Value Date   TSH 5.14 (H) 02/16/2016      Assessment & Plan:  Type 1 DM: ongoing suboptimal control: please is to emphasize basaglar.  Headache: check ESR Hypothyroidism: ne needs increased rx.  I increased synthroid. Patient is advised the following: Patient Instructions  check your blood sugar 4 times a day--before the 3 meals, and at bedtime.  also check if you have symptoms of your blood sugar being too high or too low.   please keep a record of the readings and bring it to your next appointment here.  please call us sooner if you are having low blood sugar episodes.   Please come back a follow-up appointment in 2 months.   Please reduce your novolog to none at breakfast, 8 with lunch, and 16 with supper.     Please increase the basaglar to 20 units twice a day.  blood tests are requested for you today.  We'll let you know about the results.  1 of these is  for the headache, but you should also see Dr Yong Channel about this.

## 2016-02-16 ENCOUNTER — Ambulatory Visit (INDEPENDENT_AMBULATORY_CARE_PROVIDER_SITE_OTHER): Payer: 59 | Admitting: Endocrinology

## 2016-02-16 VITALS — BP 132/84 | HR 70 | Ht 69.0 in | Wt 189.0 lb

## 2016-02-16 DIAGNOSIS — E1122 Type 2 diabetes mellitus with diabetic chronic kidney disease: Secondary | ICD-10-CM

## 2016-02-16 DIAGNOSIS — E039 Hypothyroidism, unspecified: Secondary | ICD-10-CM | POA: Diagnosis not present

## 2016-02-16 DIAGNOSIS — N183 Chronic kidney disease, stage 3 (moderate): Secondary | ICD-10-CM

## 2016-02-16 DIAGNOSIS — R519 Headache, unspecified: Secondary | ICD-10-CM | POA: Insufficient documentation

## 2016-02-16 DIAGNOSIS — Z794 Long term (current) use of insulin: Secondary | ICD-10-CM

## 2016-02-16 DIAGNOSIS — R51 Headache: Secondary | ICD-10-CM

## 2016-02-16 LAB — POCT GLYCOSYLATED HEMOGLOBIN (HGB A1C): Hemoglobin A1C: 8.2

## 2016-02-16 LAB — TSH: TSH: 5.14 u[IU]/mL — AB (ref 0.35–4.50)

## 2016-02-16 LAB — SEDIMENTATION RATE: SED RATE: 8 mm/h (ref 0–20)

## 2016-02-16 MED ORDER — LEVOTHYROXINE SODIUM 75 MCG PO TABS: 75.0000 ug | ORAL_TABLET | Freq: Every day | ORAL | 3 refills | Status: DC

## 2016-02-16 NOTE — Patient Instructions (Addendum)
check your blood sugar 4 times a day--before the 3 meals, and at bedtime.  also check if you have symptoms of your blood sugar being too high or too low.  please keep a record of the readings and bring it to your next appointment here.  please call us sooner if you are having low blood sugar episodes.   Please come back a follow-up appointment in 2 months.   Please reduce your novolog to none at breakfast, 8 with lunch, and 16 with supper.     Please increase the basaglar to 20 units twice a day.  blood tests are requested for you today.  We'll let you know about the results.  1 of these is for the headache, but you should also see Dr Durene CalHunter about this.

## 2016-02-25 ENCOUNTER — Other Ambulatory Visit: Payer: Self-pay | Admitting: Family Medicine

## 2016-02-29 ENCOUNTER — Encounter: Payer: Self-pay | Admitting: Nurse Practitioner

## 2016-02-29 NOTE — Progress Notes (Signed)
Electrophysiology Office Note Date: 03/01/2016  ID:  John Carrillo, DOB [birth year], MRN [id number]  PCP: Tana ConchStephen [place], MD Primary Cardiologist: [name] Electrophysiologist: Allred  CC: 6 week CRT follow up  John Carrillo is a 66 y.o. male seen today for Dr Johney FrameAllred.  He presents today for 6 week post CRT follow up. He reports a significant improvement in HF symptoms since implant. He had 1 episode of tachypalpitations that lasted almost an hour for which he almost called 911 but then it terminated. He denies chest pain, dyspnea, PND, orthopnea, nausea, vomiting, dizziness, syncope, edema, weight gain, or early satiety.  He has not had ICD shocks.   Device History: MDT CRTD implanted 2017 for ICM, CHF History of appropriate therapy: No History of AAD therapy: No   Past Medical History:  Diagnosis Date  . Acute kidney injury superimposed on chronic kidney disease ([facility]) 01/25/2015  . CORONARY ARTERY DISEASE 08/11/2006  . CVA 12/19/2006   2008  . DIABETES MELLITUS, TYPE I 08/11/2006  . ECZEMA, HANDS 12/15/2009  . HYPERLIPIDEMIA 08/11/2006  . HYPERTENSION 08/11/2006   Past Surgical History:  Procedure Laterality Date  . CARDIAC CATHETERIZATION N/A 01/16/2015   Procedure: Left Heart Cath and Coronary Angiography;  Surgeon: Runell GessJonathan J Berry, MD;  [facility];  Service: Cardiovascular;  Laterality: N/A;  . CORONARY STENT PLACEMENT    . EP IMPLANTABLE DEVICE N/A 01/10/2016   MDT Claria MRI Quad CRTD implanted by Dr Johney FrameAllred  . TEE WITHOUT CARDIOVERSION N/A 01/26/2015   Procedure: TRANSESOPHAGEAL ECHOCARDIOGRAM (TEE);  Surgeon: Thurmon FairMihai Croitoru, MD;  [facility];  Service: Cardiovascular;  Laterality: N/A;  . TONSILECTOMY, ADENOIDECTOMY, BILATERAL MYRINGOTOMY AND TUBES      Current Outpatient Prescriptions  Medication Sig Dispense Refill  . acetaminophen (TYLENOL) 325 MG tablet Take 2 tablets (650 mg total) by mouth every 4 (four) hours as needed for headache  or mild pain.    [name]. amoxicillin-clavulanate (AUGMENTIN) 875-125 MG tablet Take 1 tablet by mouth 2 (two) times daily. 14 tablet 0  . clopidogrel (PLAVIX) 75 MG tablet Take 1 tablet (75 mg total) by mouth daily. 90 tablet 3  . Coenzyme Q10 60 MG CAPS Take 1 capsule by mouth daily.     [name]. glucose blood (ONETOUCH VERIO) test strip Use as instructed 100 each 12  . insulin aspart (NOVOLOG FLEXPEN) 100 UNIT/ML FlexPen 3 times a day (just before each meal), 04-14-16 units, and pen needles 4/day. (Patient taking differently: 3 times a day (just before each meal), 11-18 units, and pen needles 4/day.) 15 mL 11  . Insulin Glargine (BASAGLAR KWIKPEN) 100 UNIT/ML SOPN Inject 0.16 mLs (16 Units total) into the skin 2 (two) times daily. 5 pen 11  . levothyroxine (SYNTHROID, LEVOTHROID) 75 MCG tablet Take 1 tablet (75 mcg total) by mouth daily before breakfast. 90 tablet 3  . nitroGLYCERIN (NITROSTAT) 0.4 MG SL tablet Place 1 tablet (0.4 mg total) under the tongue every 5 (five) minutes as needed for chest pain. Chest pain 30 tablet 5  . Probiotic Product (PROBIOTIC PO) Take 1 capsule by mouth every morning.    . ramipril (ALTACE) 5 MG capsule Take 1 capsule (5 mg total) by mouth daily. 60 capsule 11  . rosuvastatin (CRESTOR) 40 MG tablet Take 1 tablet (40 mg total) by mouth daily. 90 tablet 3  . spironolactone (ALDACTONE) 25 MG tablet Take 0.5 tablets (12.5 mg total) by mouth every other day. 45 tablet 11  . carvedilol (COREG)  6.25 MG tablet Take 1 tablet (6.25 mg total) by mouth 2 (two) times daily. 60 tablet 3   No current facility-administered medications for this visit.     Allergies:   Patient has no known allergies.   Social History: Social History   Social History  . Marital status: Married    Spouse name: N/A  . Number of children: N/A  . Years of education: N/A   Occupational History  . Writer   Social History Main Topics  . Smoking status: Former Smoker     Packs/day: 1.00    Years: 30.00    Types: Cigarettes    Quit date: 05/03/2000  . Smokeless tobacco: Never Used  . Alcohol use 1.8 oz/week    1 Glasses of wine, 1 Cans of beer, 1 Shots of liquor per week     Comment: 1-2/month  . Drug use: No  . Sexual activity: Not on file   Other Topics Concern  . Not on file   Social History Narrative   Married (kids are patients here, wife seen elsewhere). 3 step children, 1 biological. No grandkids.    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 CAD Father     age 9  . CAD Paternal Grandfather     96s  . CAD Paternal Grandmother     52s  . Brain cancer Maternal Grandmother   . Diabetes Neg Hx     Review of Systems: All other systems reviewed and are otherwise negative except as noted above.   Physical Exam: VS:  BP 126/70   Pulse 65   Ht 5\' 9"  (1.753 m)   Wt 185 lb (83.9 kg)   BMI 27.32 kg/m  , BMI Body mass index is 27.32 kg/m.  GEN- The patient is well appearing, alert and oriented x 3 today.   HEENT: normocephalic, atraumatic; sclera clear, conjunctiva pink; hearing intact; oropharynx clear; neck supple  Lungs- Clear to ausculation bilaterally, normal work of breathing.  No wheezes, rales, rhonchi Heart- Regular rate and rhythm (paced) GI- soft, non-tender, non-distended, bowel sounds present  Extremities- no clubbing, cyanosis, or edema  MS- no significant deformity or atrophy Skin- warm and dry, no rash or lesion; ICD pocket well healed Psych- euthymic mood, full affect Neuro- strength and sensation are intact  ICD interrogation- reviewed in detail today,  See PACEART report  EKG:  EKG is ordered today. The ekg ordered today shows sinus  rhythm with LV pacing   Recent Labs: 09/29/2015: ALT 31; Brain Natriuretic Peptide 136.5 01/05/2016: BUN 26; Creat 1.17; Hemoglobin 14.7; Platelets 206; Potassium 4.8; Sodium 139 02/16/2016: TSH 5.14   Wt Readings from Last 3 Encounters:  03/01/16 185 lb (83.9 kg)  02/16/16 189 lb (85.7 kg)  01/10/16 180 lb (81.6 kg)     Other studies Reviewed: Additional studies/ records that were reviewed today include: Dr Jenel Lucks office notes, hospital records   Assessment and Plan:  1.  Chronic systolic dysfunction euvolemic today Stable on an appropriate medical regimen Normal ICD function See Arita Miss Art report No changes today Enroll in [facility] clinic Update echo 07/2016 (6 months post CRT) BMET today  2.  CAD No recent ischemic symptoms Continue medical therapy  3.  Prior stroke Per Dr Jenel Lucks discharge summary, he has been placed on Eliquis for prior stroke without documented atrial fibrillation. If no AF at this visit, would discontinue Eliquis. Eliquis discontinued today and Plavix continued  Will monitor for AF remotely through device AF CareAlerts turned on today   4.  Sustained VT He had sustained VT at 171bpm that corresponded with episode of palpitations Increase Coreg to 6.25mg  twice daily VT zone turned on today at 168bpm with ATP only BMET today    Current medicines are reviewed at length with the patient today.   The patient does not have concerns regarding his medicines.  The following changes were made today:  Stop Eliquis, increase Coreg to 6.25mg  twice daily   Labs/ tests ordered today include: BMET, echo 07/2016 Orders Placed This Encounter  Procedures  . Basic metabolic panel  . EKG 12-Lead  . ECHOCARDIOGRAM COMPLETE     Disposition:   Follow up with Dr [name] and Dr [name] as scheduled, ICM clinic     Signed, Gypsy Balsam, NP 03/01/2016 1:10 PM  [facility] [address] [place] [address] [phone]  (office) [phone] (fax

## 2016-03-01 ENCOUNTER — Encounter: Payer: Self-pay | Admitting: Nurse Practitioner

## 2016-03-01 ENCOUNTER — Ambulatory Visit (INDEPENDENT_AMBULATORY_CARE_PROVIDER_SITE_OTHER): Payer: 59 | Admitting: Nurse Practitioner

## 2016-03-01 VITALS — BP 126/70 | HR 65 | Ht 69.0 in | Wt 185.0 lb

## 2016-03-01 DIAGNOSIS — I639 Cerebral infarction, unspecified: Secondary | ICD-10-CM | POA: Diagnosis not present

## 2016-03-01 DIAGNOSIS — I255 Ischemic cardiomyopathy: Secondary | ICD-10-CM

## 2016-03-01 DIAGNOSIS — I472 Ventricular tachycardia, unspecified: Secondary | ICD-10-CM

## 2016-03-01 DIAGNOSIS — I5022 Chronic systolic (congestive) heart failure: Secondary | ICD-10-CM

## 2016-03-01 LAB — CUP PACEART INCLINIC DEVICE CHECK
Date Time Interrogation Session: 20180126131458
Implantable Lead Implant Date: 20171206
Implantable Lead Implant Date: 20171206
Implantable Lead Location: 753858
Implantable Lead Location: 753859
Implantable Lead Model: 4598
Implantable Lead Model: 5076
Implantable Pulse Generator Implant Date: 20171206
MDC IDC LEAD IMPLANT DT: 20171206
MDC IDC LEAD LOCATION: 753860

## 2016-03-01 MED ORDER — CARVEDILOL 6.25 MG PO TABS: 6.2500 mg | ORAL_TABLET | Freq: Two times a day (BID) | ORAL | 3 refills | Status: DC

## 2016-03-01 NOTE — Patient Instructions (Addendum)
Medication Instructions:   STOP TAKING ELIQUIS   START TAKING COREG 6.25 TWICE A DAY   If you need a refill on your cardiac medications before your next appointment, please call your pharmacy.  Labwork: BMET TODAY    Testing/Procedures:  Your physician has requested that you have an echocardiogram. Echocardiography is a painless test that uses sound waves to create images of your heart. It provides your doctor with information about the size and shape of your heart and how well your heart's chambers and valves are working. This procedure takes approximately one hour. There are no restrictions for this procedure.     Follow-Up: AS SCHEDULED   Any Other Special Instructions Will Be Listed Below (If Applicable).

## 2016-03-02 LAB — BASIC METABOLIC PANEL
BUN/Creatinine Ratio: 18 (ref 10–24)
BUN: 24 mg/dL (ref 8–27)
CALCIUM: 9.8 mg/dL (ref 8.6–10.2)
CHLORIDE: 106 mmol/L (ref 96–106)
CO2: 23 mmol/L (ref 18–29)
Creatinine, Ser: 1.31 mg/dL — ABNORMAL HIGH (ref 0.76–1.27)
GFR calc non Af Amer: 57 mL/min/{1.73_m2} — ABNORMAL LOW (ref >59)
GFR, EST AFRICAN AMERICAN: 66 mL/min/{1.73_m2} (ref >59)
GLUCOSE: 128 mg/dL — AB (ref 65–99)
Potassium: 5.8 mmol/L — ABNORMAL HIGH (ref 3.5–5.2)
Sodium: 149 mmol/L — ABNORMAL HIGH (ref 134–144)

## 2016-03-04 ENCOUNTER — Other Ambulatory Visit: Payer: Self-pay | Admitting: *Deleted

## 2016-03-04 DIAGNOSIS — Z79899 Other long term (current) drug therapy: Secondary | ICD-10-CM

## 2016-03-05 ENCOUNTER — Other Ambulatory Visit: Payer: 59 | Admitting: *Deleted

## 2016-03-05 DIAGNOSIS — Z79899 Other long term (current) drug therapy: Secondary | ICD-10-CM

## 2016-03-06 ENCOUNTER — Other Ambulatory Visit: Payer: Self-pay

## 2016-03-06 LAB — BASIC METABOLIC PANEL
BUN / CREAT RATIO: 16 (ref 10–24)
BUN: 22 mg/dL (ref 8–27)
CHLORIDE: 101 mmol/L (ref 96–106)
CO2: 25 mmol/L (ref 18–29)
Calcium: 8.9 mg/dL (ref 8.6–10.2)
Creatinine, Ser: 1.34 mg/dL — ABNORMAL HIGH (ref 0.76–1.27)
GFR calc Af Amer: 64 mL/min/{1.73_m2} (ref >59)
GFR calc non Af Amer: 56 mL/min/{1.73_m2} — ABNORMAL LOW (ref >59)
GLUCOSE: 100 mg/dL — AB (ref 65–99)
Potassium: 4.1 mmol/L (ref 3.5–5.2)
SODIUM: 142 mmol/L (ref 134–144)

## 2016-04-03 ENCOUNTER — Encounter: Payer: Self-pay | Admitting: *Deleted

## 2016-04-10 ENCOUNTER — Other Ambulatory Visit: Payer: Self-pay

## 2016-04-10 ENCOUNTER — Encounter: Payer: Self-pay | Admitting: Internal Medicine

## 2016-04-10 ENCOUNTER — Ambulatory Visit (INDEPENDENT_AMBULATORY_CARE_PROVIDER_SITE_OTHER): Payer: 59 | Admitting: Internal Medicine

## 2016-04-10 ENCOUNTER — Encounter (INDEPENDENT_AMBULATORY_CARE_PROVIDER_SITE_OTHER): Payer: Self-pay

## 2016-04-10 VITALS — BP 126/78 | HR 60 | Ht 69.0 in | Wt 187.4 lb

## 2016-04-10 DIAGNOSIS — I472 Ventricular tachycardia, unspecified: Secondary | ICD-10-CM

## 2016-04-10 DIAGNOSIS — Z95810 Presence of automatic (implantable) cardiac defibrillator: Secondary | ICD-10-CM | POA: Diagnosis not present

## 2016-04-10 DIAGNOSIS — I5022 Chronic systolic (congestive) heart failure: Secondary | ICD-10-CM

## 2016-04-10 DIAGNOSIS — I255 Ischemic cardiomyopathy: Secondary | ICD-10-CM | POA: Diagnosis not present

## 2016-04-10 LAB — CUP PACEART INCLINIC DEVICE CHECK
Battery Remaining Longevity: 121 mo
Brady Statistic AP VS Percent: 0.26 %
Brady Statistic AS VS Percent: 1.28 %
HIGH POWER IMPEDANCE MEASURED VALUE: 65 Ohm
Implantable Lead Implant Date: 20171206
Implantable Lead Model: 4598
Lead Channel Impedance Value: 194.634
Lead Channel Impedance Value: 203.256
Lead Channel Impedance Value: 323 Ohm
Lead Channel Impedance Value: 399 Ohm
Lead Channel Impedance Value: 399 Ohm
Lead Channel Impedance Value: 437 Ohm
Lead Channel Impedance Value: 494 Ohm
Lead Channel Impedance Value: 665 Ohm
Lead Channel Impedance Value: 665 Ohm
Lead Channel Pacing Threshold Amplitude: 0.75 V
Lead Channel Pacing Threshold Amplitude: 0.75 V
Lead Channel Pacing Threshold Pulse Width: 0.4 ms
Lead Channel Pacing Threshold Pulse Width: 0.4 ms
Lead Channel Pacing Threshold Pulse Width: 0.4 ms
Lead Channel Sensing Intrinsic Amplitude: 13.375 mV
Lead Channel Sensing Intrinsic Amplitude: 2.5 mV
Lead Channel Setting Pacing Amplitude: 1.25 V
Lead Channel Setting Pacing Amplitude: 2.5 V
Lead Channel Setting Pacing Pulse Width: 0.4 ms
Lead Channel Setting Pacing Pulse Width: 0.4 ms
Lead Channel Setting Sensing Sensitivity: 0.3 mV
MDC IDC LEAD IMPLANT DT: 20171206
MDC IDC LEAD IMPLANT DT: 20171206
MDC IDC LEAD LOCATION: 753858
MDC IDC LEAD LOCATION: 753859
MDC IDC LEAD LOCATION: 753860
MDC IDC MSMT BATTERY VOLTAGE: 3.11 V
MDC IDC MSMT LEADCHNL LV IMPEDANCE VALUE: 194.634
MDC IDC MSMT LEADCHNL LV IMPEDANCE VALUE: 199.5 Ohm
MDC IDC MSMT LEADCHNL LV IMPEDANCE VALUE: 208.568
MDC IDC MSMT LEADCHNL LV IMPEDANCE VALUE: 380 Ohm
MDC IDC MSMT LEADCHNL LV IMPEDANCE VALUE: 703 Ohm
MDC IDC MSMT LEADCHNL LV IMPEDANCE VALUE: 703 Ohm
MDC IDC MSMT LEADCHNL LV IMPEDANCE VALUE: 722 Ohm
MDC IDC MSMT LEADCHNL RA IMPEDANCE VALUE: 456 Ohm
MDC IDC MSMT LEADCHNL RA PACING THRESHOLD AMPLITUDE: 0.5 V
MDC IDC MSMT LEADCHNL RV IMPEDANCE VALUE: 437 Ohm
MDC IDC PG IMPLANT DT: 20171206
MDC IDC SESS DTM: 20180307105619
MDC IDC SET LEADCHNL RA PACING AMPLITUDE: 2 V
MDC IDC STAT BRADY AP VP PERCENT: 13.98 %
MDC IDC STAT BRADY AS VP PERCENT: 84.49 %
MDC IDC STAT BRADY RA PERCENT PACED: 14.23 %
MDC IDC STAT BRADY RV PERCENT PACED: 3.27 %

## 2016-04-10 NOTE — Patient Instructions (Signed)
Medication Instructions:  Your physician recommends that you continue on your current medications as directed. Please refer to the Current Medication list given to you today.   Labwork: None ordered   Testing/Procedures: None ordered   Follow-Up: Remote monitoring is used to monitor your  ICD from home. This monitoring reduces the number of office visits required to check your device to one time per year. It allows us to keep an eye on the functioning of your device to ensure it is working properly. You are scheduled for a device check from home on 07/10/16. You may send your transmission at any time that day. If you have a wireless device, the transmission will be sent automatically. After your physician reviews your transmission, you will receive a postcard with your next transmission date.  Your physician wants you to follow-up in: 6 months with Amber Seiler,NP You will receive a reminder letter in the mail two months in advance. If you don't receive a letter, please call our office to schedule the follow-up appointment.     Any Other Special Instructions Will Be Listed Below (If Applicable).     If you need a refill on your cardiac medications before your next appointment, please call your pharmacy.

## 2016-04-10 NOTE — Progress Notes (Signed)
PCP: Tana ConchStephen [place], MD Primary Cardiologist:  Dr [name] Primary EP:  Dr Johney FrameAllred  John BolusStephen A Tarleton is a 66 y.o. male who presents today for routine electrophysiology followup.  Since his recent BiV ICd implant, the patient reports doing very well.  He has received clinical benefit and is pleased with results.  Today, he denies symptoms of palpitations, chest pain, shortness of breath,  lower extremity edema, dizziness, presyncope, syncope, or ICD shocks.  The patient is otherwise without complaint today.   Past Medical History:  Diagnosis Date  . Acute kidney injury superimposed on chronic kidney disease ([facility]) 01/25/2015  . CORONARY ARTERY DISEASE 08/11/2006  . CVA 12/19/2006   2008  . DIABETES MELLITUS, TYPE I 08/11/2006  . ECZEMA, HANDS 12/15/2009  . HYPERLIPIDEMIA 08/11/2006  . HYPERTENSION 08/11/2006   Past Surgical History:  Procedure Laterality Date  . CARDIAC CATHETERIZATION N/A 01/16/2015   Procedure: Left Heart Cath and Coronary Angiography;  Surgeon: Runell GessJonathan J Berry, MD;  [facility];  Service: Cardiovascular;  Laterality: N/A;  . CORONARY STENT PLACEMENT    . EP IMPLANTABLE DEVICE N/A 01/10/2016   MDT Claria MRI Quad CRTD implanted by Dr Johney FrameAllred  . TEE WITHOUT CARDIOVERSION N/A 01/26/2015   Procedure: TRANSESOPHAGEAL ECHOCARDIOGRAM (TEE);  Surgeon: Thurmon FairMihai Croitoru, MD;  [facility];  Service: Cardiovascular;  Laterality: N/A;  . TONSILECTOMY, ADENOIDECTOMY, BILATERAL MYRINGOTOMY AND TUBES      ROS- all systems are reviewed and negative except as per HPI above  Current Outpatient Prescriptions  Medication Sig Dispense Refill  . acetaminophen (TYLENOL) 325 MG tablet Take 2 tablets (650 mg total) by mouth every 4 (four) hours as needed for headache or mild pain.    . carvedilol (COREG) 6.25 MG tablet Take 1 tablet (6.25 mg total) by mouth 2 (two) times daily. 60 tablet 3  . clopidogrel (PLAVIX) 75 MG tablet Take 1 tablet (75 mg total) by mouth daily. 90  tablet 3  . Coenzyme Q10 60 MG CAPS Take 1 capsule by mouth daily.     [name]. glucose blood (ONETOUCH VERIO) test strip Use as instructed 100 each 12  . insulin aspart (NOVOLOG FLEXPEN) 100 UNIT/ML FlexPen 3 times a day (just before each meal), 04-14-16 units, and pen needles 4/day. 15 mL 11  . Insulin Glargine (BASAGLAR KWIKPEN) 100 UNIT/ML SOPN Inject 0.16 mLs (16 Units total) into the skin 2 (two) times daily. 5 pen 11  . levothyroxine (SYNTHROID, LEVOTHROID) 75 MCG tablet Take 1 tablet (75 mcg total) by mouth daily before breakfast. 90 tablet 3  . nitroGLYCERIN (NITROSTAT) 0.4 MG SL tablet Place 1 tablet (0.4 mg total) under the tongue every 5 (five) minutes as needed for chest pain. Chest pain 30 tablet 5  . Probiotic Product (PROBIOTIC PO) Take 1 capsule by mouth every morning.    . ramipril (ALTACE) 5 MG capsule Take 1 capsule (5 mg total) by mouth daily. 60 capsule 11  . rosuvastatin (CRESTOR) 40 MG tablet Take 1 tablet (40 mg total) by mouth daily. 90 tablet 3   No current facility-administered medications for this visit.     Physical Exam: Vitals:   04/10/16 0942  BP: 126/78  Pulse: 60  SpO2: 97%  Weight: 187 lb 6.4 oz (85 kg)  Height: 5\' 9"  (1.753 m)    GEN- The patient is well appearing, alert and oriented x 3 today.   Head- normocephalic, atraumatic Eyes-  Sclera clear, conjunctiva pink Ears- hearing intact Oropharynx- clear Lungs- Clear  to ausculation bilaterally, normal work of breathing Chest- ICD pocket is well healed Heart- Regular rate and rhythm, no murmurs, rubs or gallops, PMI not laterally displaced GI- soft, NT, ND, + BS Extremities- no clubbing, cyanosis, or edema  ICD interrogation- reviewed in detail today,  See PACEART report  ekg today reveals AV paced rhythm  Assessment and Plan:  1.  Chronic systolic dysfunction The patient has an ischemic CM (EF 20%), NYHA Class II CHF, LBBB and CAD. Doing well s/p BiV ICD Repeat echo in 3 months [facility]  2.  SVT- well controlled with current medicines, no further workup planned at this time  Follow-up with Dr [name] as scheduled Return to see EP NP every 6 months  Hillis Range MD, [facility] 04/10/2016 10:14 AM

## 2016-04-26 ENCOUNTER — Telehealth: Payer: Self-pay | Admitting: Endocrinology

## 2016-04-26 ENCOUNTER — Ambulatory Visit (INDEPENDENT_AMBULATORY_CARE_PROVIDER_SITE_OTHER): Payer: 59 | Admitting: Endocrinology

## 2016-04-26 ENCOUNTER — Encounter: Payer: Self-pay | Admitting: Endocrinology

## 2016-04-26 VITALS — BP 128/82 | HR 90 | Wt 184.0 lb

## 2016-04-26 DIAGNOSIS — N183 Chronic kidney disease, stage 3 (moderate): Secondary | ICD-10-CM | POA: Diagnosis not present

## 2016-04-26 DIAGNOSIS — Z794 Long term (current) use of insulin: Secondary | ICD-10-CM | POA: Diagnosis not present

## 2016-04-26 DIAGNOSIS — E1122 Type 2 diabetes mellitus with diabetic chronic kidney disease: Secondary | ICD-10-CM

## 2016-04-26 LAB — POCT GLYCOSYLATED HEMOGLOBIN (HGB A1C): HEMOGLOBIN A1C: 8.3

## 2016-04-26 MED ORDER — BASAGLAR KWIKPEN 100 UNIT/ML ~~LOC~~ SOPN: 18.0000 [IU] | PEN_INJECTOR | Freq: Two times a day (BID) | SUBCUTANEOUS | 11 refills | Status: DC

## 2016-04-26 MED ORDER — INSULIN PEN NEEDLE 31G X 5 MM MISC
5 refills | Status: DC
Start: 2016-04-26 — End: 2016-10-25

## 2016-04-26 MED ORDER — INSULIN ASPART 100 UNIT/ML FLEXPEN: PEN_INJECTOR | SUBCUTANEOUS | 11 refills | Status: DC

## 2016-04-26 MED ORDER — BASAGLAR KWIKPEN 100 UNIT/ML ~~LOC~~ SOPN: 20.0000 [IU] | PEN_INJECTOR | Freq: Two times a day (BID) | SUBCUTANEOUS | 11 refills | Status: DC

## 2016-04-26 NOTE — Telephone Encounter (Signed)
Rx for pen needles submitted.  

## 2016-04-26 NOTE — Telephone Encounter (Signed)
I contacted [facility] and clarified the novolog rx. Dosage is 0 units at breakfast, 8 units with lunch and 16 units with supper.

## 2016-04-26 NOTE — Telephone Encounter (Signed)
[facility] needs a separate rx for the pen needles please

## 2016-04-26 NOTE — Telephone Encounter (Signed)
[facility] is calling asking for the direction of medication that was sent to pharmacy today.  [phone] (Phone)

## 2016-04-26 NOTE — Progress Notes (Signed)
Subjective:    Patient ID: John Carrillo, male    DOB: [birth year], 66 y.o.   MRN: [id number]  HPI Pt returns for f/u of diabetes mellitus: DM type: 1 Dx'ed: 2008. Complications: CVA, PAD, renal insufficiency, and CAD.   Therapy: insulin since soon after dx.  DKA: never.  Severe hypoglycemia: never.  Pancreatitis: never.   Other: he declines pump, including V-GO; he takes multiple daily injections; He says his ability to care for his DM has been compromised by wife's illness.  Interval history: no cbg record, but states he has mild hypoglycemia approx once per week.  This usually happens at lunch.  pt states he feels well in general.  He did not adjust insulin as advised at last ov.  Past Medical History:  Diagnosis Date  . Acute kidney injury superimposed on chronic kidney disease (HCC) 01/25/2015  . CORONARY ARTERY DISEASE 08/11/2006  . CVA 12/19/2006   2008  . DIABETES MELLITUS, TYPE I 08/11/2006  . ECZEMA, HANDS 12/15/2009  . HYPERLIPIDEMIA 08/11/2006  . HYPERTENSION 08/11/2006    Past Surgical History:  Procedure Laterality Date  . CARDIAC CATHETERIZATION N/A 01/16/2015   Procedure: Left Heart Cath and Coronary Angiography;  Surgeon: [name], MD;  Location: [facility] INVASIVE CV LAB;  Service: Cardiovascular;  Laterality: N/A;  . CORONARY STENT PLACEMENT    . EP IMPLANTABLE DEVICE N/A 01/10/2016   MDT Claria MRI Quad CRTD implanted by Dr [name]  . TEE WITHOUT CARDIOVERSION N/A 01/26/2015   Procedure: TRANSESOPHAGEAL ECHOCARDIOGRAM (TEE);  Surgeon: [name], MD;  Location: [facility] ENDOSCOPY;  Service: Cardiovascular;  Laterality: N/A;  . TONSILECTOMY, ADENOIDECTOMY, BILATERAL MYRINGOTOMY AND TUBES      Social History   Social History  . Marital status: Married    Spouse name: N/A  . Number of children: N/A  . Years of education: N/A   Occupational History  . Writer   Social History Main Topics  . Smoking status: Former Smoker   Packs/day: 1.00    Years: 30.00    Types: Cigarettes    Quit date: 05/03/2000  . Smokeless tobacco: Never Used  . Alcohol use 1.8 oz/week    1 Glasses of wine, 1 Cans of beer, 1 Shots of liquor per week     Comment: 1-2/month  . Drug use: No  . Sexual activity: Not on file   Other Topics Concern  . Not on file   Social History Narrative   Married (kids are patients here, wife seen elsewhere). 3 step children, 1 biological. No grandkids.    In late 2016:   79 yo sophomore at [employer]- consider age 57 retirement   Oldest daughter - [employer]- social work intl business minor religion      Emergency planning/management officer for [employer]      Hobbies: race sail boats, adrenaline related activities    Current Outpatient Prescriptions on File Prior to Visit  Medication Sig Dispense Refill  . acetaminophen (TYLENOL) 325 MG tablet Take 2 tablets (650 mg total) by mouth every 4 (four) hours as needed for headache or mild pain.    . carvedilol (COREG) 6.25 MG tablet Take 1 tablet (6.25 mg total) by mouth 2 (two) times daily. 60 tablet 3  . clopidogrel (PLAVIX) 75 MG tablet Take 1 tablet (75 mg total) by mouth daily. 90 tablet 3  . Coenzyme Q10 60 MG CAPS Take 1 capsule by mouth daily.     [name] glucose blood (ONETOUCH  VERIO) test strip Use as instructed 100 each 12  . levothyroxine (SYNTHROID, LEVOTHROID) 75 MCG tablet Take 1 tablet (75 mcg total) by mouth daily before breakfast. 90 tablet 3  . nitroGLYCERIN (NITROSTAT) 0.4 MG SL tablet Place 1 tablet (0.4 mg total) under the tongue every 5 (five) minutes as needed for chest pain. Chest pain 30 tablet 5  . Probiotic Product (PROBIOTIC PO) Take 1 capsule by mouth every morning.    . ramipril (ALTACE) 5 MG capsule Take 1 capsule (5 mg total) by mouth daily. 60 capsule 11  . rosuvastatin (CRESTOR) 40 MG tablet Take 1 tablet (40 mg total) by mouth daily. 90 tablet 3   No current facility-administered medications on file prior to visit.     No Known  Allergies  Family History  Problem Relation Age of Onset  . CAD Father   . CAD Paternal Grandfather     2370s  . CAD Paternal Grandmother     1570s  . Brain cancer Maternal Grandmother   . Diabetes Neg Hx     BP 128/82   Pulse 90   Wt 184 lb (83.5 kg)   SpO2 96%   BMI 27.17 kg/m    Review of Systems He denies LOC.     Objective:   Physical Exam VITAL SIGNS:  See vs page GENERAL: no distress Pulses: dorsalis pedis intact bilat.   MSK: no deformity of the feet CV: no leg edema Skin:  no ulcer on the feet.  normal color and temp on the feet.  Neuro: sensation is intact to touch on the feet.    a1c=8.3%    Assessment & Plan:  Type 1 DM, with renal insuff: he needs increased rx: plan is to further emphasize the basal insulin.   Patient is advised the following: Patient Instructions  check your blood sugar 4 times a day--before the 3 meals, and at bedtime.  also check if you have symptoms of your blood sugar being too high or too low.  please keep a record of the readings and bring it to your next appointment here.  please call us sooner if you are having low blood sugar episodes.   Please come back a follow-up appointment in 2 months.   Please reduce your novolog to none at breakfast, 8 with lunch, and 16 with supper.     Please increase the basaglar to 20 units twice a day.

## 2016-04-26 NOTE — Patient Instructions (Signed)
check your blood sugar 4 times a day--before the 3 meals, and at bedtime.  also check if you have symptoms of your blood sugar being too high or too low.  please keep a record of the readings and bring it to your next appointment here.  please call us sooner if you are having low blood sugar episodes.   Please come back a follow-up appointment in 2 months.   Please reduce your novolog to none at breakfast, 8 with lunch, and 16 with supper.     Please increase the basaglar to 20 units twice a day.

## 2016-05-10 ENCOUNTER — Telehealth: Payer: Self-pay

## 2016-05-10 NOTE — Telephone Encounter (Signed)
Referred to ICM clinic by Dr Allred/Amber Glory Buff, NP.  ICM intro call made and patient agreed to monthly ICM follow up.  1st ICM remote transmission scheduled for 05/28/2016.  Provided direct ICM number.  He has monitor by bedside and advised transmission should send automatically on scheduled date.

## 2016-05-28 ENCOUNTER — Ambulatory Visit (INDEPENDENT_AMBULATORY_CARE_PROVIDER_SITE_OTHER): Payer: 59

## 2016-05-28 DIAGNOSIS — I5022 Chronic systolic (congestive) heart failure: Secondary | ICD-10-CM

## 2016-05-28 DIAGNOSIS — Z95810 Presence of automatic (implantable) cardiac defibrillator: Secondary | ICD-10-CM

## 2016-05-28 NOTE — Progress Notes (Signed)
EPIC Encounter for ICM Monitoring  Patient Name: John Carrillo is a 66 y.o. male Date: 05/28/2016 Primary Care Physican: Tana Conch, MD Primary Cardiologist: [name] Electrophysiologist: Allred Dry Weight: 178 lbs  Bi-V Pacing:  98.3%       1st ICM encounter.  Heart Failure questions reviewed, pt asymptomatic.   Thoracic impedance abnormal suggesting fluid accumulation until today, 05/28/2016.  He reported in the last week he was at a regatta eating catered foods which are high in salt.  No diuretic  Recommendations: No changes. Discussed how to limit salt intake to 2000 mg/day and fluid intake to < 2 liters/day.  Encouraged to call for fluid symptoms.  Follow-up plan: ICM clinic phone appointment on 07/10/2016.    Copy of ICM check sent to device physician.   3 month ICM trend: 05/28/2016   1 Year ICM trend:      Karie Soda, RN 05/28/2016 3:43 PM

## 2016-06-21 ENCOUNTER — Encounter: Payer: Self-pay | Admitting: Endocrinology

## 2016-06-21 ENCOUNTER — Ambulatory Visit (INDEPENDENT_AMBULATORY_CARE_PROVIDER_SITE_OTHER): Payer: 59 | Admitting: Endocrinology

## 2016-06-21 VITALS — BP 128/80 | HR 60 | Ht 69.0 in | Wt 181.0 lb

## 2016-06-21 DIAGNOSIS — Z794 Long term (current) use of insulin: Secondary | ICD-10-CM | POA: Diagnosis not present

## 2016-06-21 DIAGNOSIS — E1122 Type 2 diabetes mellitus with diabetic chronic kidney disease: Secondary | ICD-10-CM

## 2016-06-21 DIAGNOSIS — N183 Chronic kidney disease, stage 3 (moderate): Secondary | ICD-10-CM

## 2016-06-21 LAB — POCT GLYCOSYLATED HEMOGLOBIN (HGB A1C): Hemoglobin A1C: 7.4

## 2016-06-21 NOTE — Patient Instructions (Addendum)
check your blood sugar 4 times a day--before the 3 meals, and at bedtime.  also check if you have symptoms of your blood sugar being too high or too low.  please keep a record of the readings and bring it to your next appointment here.  please call us sooner if you are having low blood sugar episodes.   Please come back a follow-up appointment in 4 months.   Please continue the same insulins.

## 2016-06-21 NOTE — Progress Notes (Signed)
Subjective:    Patient ID: John BolusStephen A Carrillo, male    DOB: March 26, 1950, 66 y.o.   MRN: [id number]  HPI Pt returns for f/u of diabetes mellitus: DM type: 1 Dx'ed: 2008. Complications: CVA, PAD, renal insufficiency, and CAD.   Therapy: insulin since soon after dx.  DKA: never.  Severe hypoglycemia: never.  Pancreatitis: never.   Other: he declines pump, including V-GO; he takes multiple daily injections; He says his ability to care for his DM has been compromised by wife's illness.  Interval history: He seldom has hypoglycemia, and these episodes are mild.  no cbg record, but states cbg's are highest in the afternoon, and lowest at lunch.  He feels glycemic control will increase as activity increases in summer.   Past Medical History:  Diagnosis Date  . Acute kidney injury superimposed on chronic kidney disease (HCC) 01/25/2015  . CORONARY ARTERY DISEASE 08/11/2006  . CVA 12/19/2006   2008  . DIABETES MELLITUS, TYPE I 08/11/2006  . ECZEMA, HANDS 12/15/2009  . HYPERLIPIDEMIA 08/11/2006  . HYPERTENSION 08/11/2006    Past Surgical History:  Procedure Laterality Date  . CARDIAC CATHETERIZATION N/A 01/16/2015   Procedure: Left Heart Cath and Coronary Angiography;  Surgeon: Runell GessJonathan J Berry, MD;  [facility];  Service: Cardiovascular;  Laterality: N/A;  . CORONARY STENT PLACEMENT    . EP IMPLANTABLE DEVICE N/A 01/10/2016   MDT Claria MRI Quad CRTD implanted by Dr Johney FrameAllred  . TEE WITHOUT CARDIOVERSION N/A 01/26/2015   Procedure: TRANSESOPHAGEAL ECHOCARDIOGRAM (TEE);  Surgeon: Thurmon FairMihai Croitoru, MD;  [facility];  Service: Cardiovascular;  Laterality: N/A;  . TONSILECTOMY, ADENOIDECTOMY, BILATERAL MYRINGOTOMY AND TUBES      Social History   Social History  . Marital status: Married    Spouse name: N/A  . Number of children: N/A  . Years of education: N/A   Occupational History  . Writerroduction Manager [employer]   Social History Main Topics  . Smoking status:  Former Smoker    Packs/day: 1.00    Years: 30.00    Types: Cigarettes    Quit date: 05/03/2000  . Smokeless tobacco: Never Used  . Alcohol use 1.8 oz/week    1 Glasses of wine, 1 Cans of beer, 1 Shots of liquor per week     Comment: 1-2/month  . Drug use: No  . Sexual activity: Not on file   Other Topics Concern  . Not on file   Social History Narrative   Married (kids are patients here, wife seen elsewhere). 3 step children, 1 biological. No grandkids.    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 acetaminophen (TYLENOL) 325 MG tablet Take 2 tablets (650 mg total) by mouth every 4 (four) hours as needed for headache or mild pain.    . carvedilol (COREG) 6.25 MG tablet Take 1 tablet (6.25 mg total) by mouth 2 (two) times daily. 60 tablet 3  . clopidogrel (PLAVIX) 75 MG tablet Take 1 tablet (75 mg total) by mouth daily. 90 tablet 3  . Coenzyme Q10 60 MG CAPS Take 1 capsule by mouth daily.     [name]. glucose  blood (ONETOUCH VERIO) test strip Use as instructed 100 each 12  . insulin aspart (NOVOLOG FLEXPEN) 100 UNIT/ML FlexPen none at breakfast, 8 with lunch, and 16 with supper, and pen needles 4/day. 15 mL 11  . Insulin Glargine (BASAGLAR KWIKPEN) 100 UNIT/ML SOPN Inject 0.2 mLs (20 Units total) into the skin 2 (two) times daily. 5 pen 11  . Insulin Pen Needle 31G X 5 MM MISC Use to inject insulin 4 times per day. 200 each 5  . levothyroxine (SYNTHROID, LEVOTHROID) 75 MCG tablet Take 1 tablet (75 mcg total) by mouth daily before breakfast. 90 tablet 3  . nitroGLYCERIN (NITROSTAT) 0.4 MG SL tablet Place 1 tablet (0.4 mg total) under the tongue every 5 (five) minutes as needed for chest  pain. Chest pain 30 tablet 5  . Probiotic Product (PROBIOTIC PO) Take 1 capsule by mouth every morning.    . ramipril (ALTACE) 5 MG capsule Take 1 capsule (5 mg total) by mouth daily. 60 capsule 11  . rosuvastatin (CRESTOR) 40 MG tablet Take 1 tablet (40 mg total) by mouth daily. 90 tablet 3   No current facility-administered medications on file prior to visit.     No Known Allergies  Family History  Problem Relation Age of Onset  . CAD Father   . CAD Paternal Grandfather        46s  . CAD Paternal Grandmother        4s  . Brain cancer Maternal Grandmother   . Diabetes Neg Hx     BP 128/80   Pulse 60   Ht 5\' 9"  (1.753 m)   Wt 181 lb (82.1 kg)   SpO2 96%   BMI 26.73 kg/m   Review of Systems Denies LOC.      Objective:   Physical Exam VITAL SIGNS:  See vs page GENERAL: no distress Pulses: dorsalis pedis intact bilat.   MSK: no deformity of the feet CV: no leg edema Skin:  no ulcer on the feet.  normal color and temp on the feet. Neuro: sensation is intact to touch on the feet.   Lab Results  Component Value Date   HGBA1C 7.4 06/21/2016      Assessment & Plan:  Type 1 DM: emphasizing basal is helping.  this is the best control this pt should aim for, given this regimen, which does match insulin to his changing needs throughout the day. CAD: he should avoid hypoglycemia.  Patient Instructions  check your blood sugar 4 times a day--before the 3 meals, and at bedtime.  also check if you have symptoms of your blood sugar being too high or too low.  please keep a record of the readings and bring it to your next appointment here.  please call us sooner if you are having low blood sugar episodes.   Please come back a follow-up appointment in 4 months.   Please continue the same insulins.

## 2016-07-05 ENCOUNTER — Other Ambulatory Visit: Payer: Self-pay

## 2016-07-05 ENCOUNTER — Ambulatory Visit (HOSPITAL_COMMUNITY): Payer: 59 | Attending: Cardiovascular Disease

## 2016-07-05 ENCOUNTER — Encounter (INDEPENDENT_AMBULATORY_CARE_PROVIDER_SITE_OTHER): Payer: Self-pay

## 2016-07-05 DIAGNOSIS — I5022 Chronic systolic (congestive) heart failure: Secondary | ICD-10-CM | POA: Diagnosis present

## 2016-07-06 ENCOUNTER — Other Ambulatory Visit: Payer: Self-pay | Admitting: Physician Assistant

## 2016-07-06 ENCOUNTER — Other Ambulatory Visit: Payer: Self-pay | Admitting: Nurse Practitioner

## 2016-07-06 DIAGNOSIS — E785 Hyperlipidemia, unspecified: Secondary | ICD-10-CM

## 2016-07-06 DIAGNOSIS — I2510 Atherosclerotic heart disease of native coronary artery without angina pectoris: Secondary | ICD-10-CM

## 2016-07-06 DIAGNOSIS — Z9861 Coronary angioplasty status: Secondary | ICD-10-CM

## 2016-07-10 ENCOUNTER — Ambulatory Visit (INDEPENDENT_AMBULATORY_CARE_PROVIDER_SITE_OTHER): Payer: 59 | Admitting: *Deleted

## 2016-07-10 DIAGNOSIS — Z95810 Presence of automatic (implantable) cardiac defibrillator: Secondary | ICD-10-CM | POA: Diagnosis not present

## 2016-07-10 DIAGNOSIS — I5022 Chronic systolic (congestive) heart failure: Secondary | ICD-10-CM | POA: Diagnosis not present

## 2016-07-10 DIAGNOSIS — I255 Ischemic cardiomyopathy: Secondary | ICD-10-CM

## 2016-07-10 NOTE — Progress Notes (Signed)
Remote ICD transmission.   

## 2016-07-11 NOTE — Progress Notes (Signed)
EPIC Encounter for ICM Monitoring  Patient Name: John BolusStephen A Aughenbaugh is a 66 y.o. male Date: 07/11/2016 Primary Care Physican: Shelva MajesticHunter, Walid O, MD Primary Cardiologist: [name] Electrophysiologist: Allred Dry Weight: 175 lbs  Bi-V Pacing:  97.7%       Heart Failure questions reviewed, pt asymptomatic    Thoracic impedance normal but was abnormal suggesting fluid accumulation approximately 5/25 to 5/30.  He thinks the holiday may have effected the fluid levels since he was at a cookout.   No diuretic  Recommendations: No changes. Discussed to limit salt intake to 2000 mg/day and fluid intake to < 2 liters/day.  Encouraged to call for fluid symptoms.  Follow-up plan: ICM clinic phone appointment on 08/13/2016.    Copy of ICM check sent to device physician.   3 month ICM trend: 07/10/2016   1 Year ICM trend:      Karie SodaLaurie S Eleazar Kimmey, RN 07/11/2016 4:06 PM

## 2016-07-12 LAB — CUP PACEART REMOTE DEVICE CHECK
Brady Statistic AP VP Percent: 24.34 %
Brady Statistic AP VS Percent: 0.44 %
Brady Statistic AS VP Percent: 74.04 %
Brady Statistic RA Percent Paced: 24.63 %
Brady Statistic RV Percent Paced: 9.43 %
Date Time Interrogation Session: 20180606073525
HIGH POWER IMPEDANCE MEASURED VALUE: 59 Ohm
Implantable Lead Implant Date: 20171206
Implantable Lead Location: 753858
Implantable Lead Location: 753859
Implantable Pulse Generator Implant Date: 20171206
Lead Channel Impedance Value: 194.634
Lead Channel Impedance Value: 194.634
Lead Channel Impedance Value: 194.634
Lead Channel Impedance Value: 199.5 Ohm
Lead Channel Impedance Value: 199.5 Ohm
Lead Channel Impedance Value: 266 Ohm
Lead Channel Impedance Value: 399 Ohm
Lead Channel Impedance Value: 399 Ohm
Lead Channel Impedance Value: 456 Ohm
Lead Channel Impedance Value: 646 Ohm
Lead Channel Impedance Value: 665 Ohm
Lead Channel Impedance Value: 665 Ohm
Lead Channel Pacing Threshold Amplitude: 0.375 V
Lead Channel Pacing Threshold Amplitude: 0.75 V
Lead Channel Pacing Threshold Amplitude: 0.875 V
Lead Channel Pacing Threshold Pulse Width: 0.4 ms
Lead Channel Setting Pacing Amplitude: 1.25 V
Lead Channel Setting Pacing Amplitude: 2.5 V
Lead Channel Setting Pacing Pulse Width: 0.4 ms
MDC IDC LEAD IMPLANT DT: 20171206
MDC IDC LEAD IMPLANT DT: 20171206
MDC IDC LEAD LOCATION: 753860
MDC IDC MSMT BATTERY REMAINING LONGEVITY: 118 mo
MDC IDC MSMT BATTERY VOLTAGE: 3.06 V
MDC IDC MSMT LEADCHNL LV IMPEDANCE VALUE: 380 Ohm
MDC IDC MSMT LEADCHNL LV IMPEDANCE VALUE: 399 Ohm
MDC IDC MSMT LEADCHNL LV IMPEDANCE VALUE: 399 Ohm
MDC IDC MSMT LEADCHNL LV IMPEDANCE VALUE: 646 Ohm
MDC IDC MSMT LEADCHNL LV IMPEDANCE VALUE: 665 Ohm
MDC IDC MSMT LEADCHNL LV PACING THRESHOLD PULSEWIDTH: 0.4 ms
MDC IDC MSMT LEADCHNL RA IMPEDANCE VALUE: 399 Ohm
MDC IDC MSMT LEADCHNL RA SENSING INTR AMPL: 2.625 mV
MDC IDC MSMT LEADCHNL RA SENSING INTR AMPL: 2.625 mV
MDC IDC MSMT LEADCHNL RV PACING THRESHOLD PULSEWIDTH: 0.4 ms
MDC IDC MSMT LEADCHNL RV SENSING INTR AMPL: 15.5 mV
MDC IDC MSMT LEADCHNL RV SENSING INTR AMPL: 15.5 mV
MDC IDC SET LEADCHNL LV PACING PULSEWIDTH: 0.4 ms
MDC IDC SET LEADCHNL RA PACING AMPLITUDE: 2 V
MDC IDC SET LEADCHNL RV SENSING SENSITIVITY: 0.3 mV
MDC IDC STAT BRADY AS VS PERCENT: 1.18 %

## 2016-07-15 ENCOUNTER — Telehealth: Payer: Self-pay | Admitting: Internal Medicine

## 2016-07-15 NOTE — Telephone Encounter (Signed)
New Message      [place] [employer] [place] and is concerned with his device is there any rides he should not do? He will be using the magic bands that are RF devices , will they be a concern ? Does he need to take foam straps for the rides so they dont press on the device

## 2016-07-15 NOTE — Telephone Encounter (Signed)
Spoke with pt and informed him that as clinic there have been patients that went [place] [employer] and no issues with rides. Informed pt that as far as the RF magic band just not to lay the band on top of his device, and foam straps are necessary. Pt voiced understanding.

## 2016-07-16 ENCOUNTER — Encounter: Payer: Self-pay | Admitting: Cardiology

## 2016-08-13 ENCOUNTER — Other Ambulatory Visit: Payer: Self-pay | Admitting: Family Medicine

## 2016-08-13 ENCOUNTER — Ambulatory Visit (INDEPENDENT_AMBULATORY_CARE_PROVIDER_SITE_OTHER): Payer: 59

## 2016-08-13 DIAGNOSIS — Z95810 Presence of automatic (implantable) cardiac defibrillator: Secondary | ICD-10-CM

## 2016-08-13 DIAGNOSIS — Z9861 Coronary angioplasty status: Principal | ICD-10-CM

## 2016-08-13 DIAGNOSIS — I5022 Chronic systolic (congestive) heart failure: Secondary | ICD-10-CM | POA: Diagnosis not present

## 2016-08-13 DIAGNOSIS — I2510 Atherosclerotic heart disease of native coronary artery without angina pectoris: Secondary | ICD-10-CM

## 2016-08-13 DIAGNOSIS — E785 Hyperlipidemia, unspecified: Secondary | ICD-10-CM

## 2016-08-13 DIAGNOSIS — I6340 Cerebral infarction due to embolism of unspecified cerebral artery: Secondary | ICD-10-CM

## 2016-08-13 DIAGNOSIS — I471 Supraventricular tachycardia: Secondary | ICD-10-CM

## 2016-08-13 DIAGNOSIS — I10 Essential (primary) hypertension: Secondary | ICD-10-CM

## 2016-08-13 DIAGNOSIS — I255 Ischemic cardiomyopathy: Secondary | ICD-10-CM

## 2016-08-13 DIAGNOSIS — N182 Chronic kidney disease, stage 2 (mild): Secondary | ICD-10-CM

## 2016-08-13 NOTE — Progress Notes (Signed)
EPIC Encounter for ICM Monitoring  Patient Name: John Carrillo is a 66 y.o. male Date: 08/13/2016 Primary Care Physican: Shelva MajesticHunter, Jaren O, MD Primary Cardiologist: [name] Electrophysiologist: Allred Dry Weight:  175 lbs  Bi-V Pacing: 97.8%          Heart Failure questions reviewed, pt asymptomatic .   Thoracic impedance normal but was abnormal suggesting fluid accumulation from 07/30/2016 to 08/09/2016 which he stated was the time of his son's wedding.  No diuretic  Recommendations: No changes.   Encouraged to call for fluid symptoms.  Follow-up plan: ICM clinic phone appointment on 09/13/2016.   Copy of ICM check sent to device physician.   3 month ICM trend: 08/13/2016   1 Year ICM trend:      John SodaLaurie S Abigal Choung, RN 08/13/2016 8:30 AM

## 2016-08-14 NOTE — Telephone Encounter (Signed)
Yes thanks but needs office visit

## 2016-08-20 NOTE — Telephone Encounter (Signed)
Called and left a voicemail message asking for patient to call and schedule an appointment.

## 2016-09-12 ENCOUNTER — Ambulatory Visit (INDEPENDENT_AMBULATORY_CARE_PROVIDER_SITE_OTHER): Payer: 59 | Admitting: Family Medicine

## 2016-09-12 ENCOUNTER — Encounter: Payer: Self-pay | Admitting: Family Medicine

## 2016-09-12 VITALS — BP 122/82 | HR 63 | Temp 97.9°F | Ht 69.0 in | Wt 183.2 lb

## 2016-09-12 DIAGNOSIS — H9390 Unspecified disorder of ear, unspecified ear: Secondary | ICD-10-CM

## 2016-09-12 DIAGNOSIS — Z8673 Personal history of transient ischemic attack (TIA), and cerebral infarction without residual deficits: Secondary | ICD-10-CM | POA: Diagnosis not present

## 2016-09-12 DIAGNOSIS — H9193 Unspecified hearing loss, bilateral: Secondary | ICD-10-CM

## 2016-09-12 DIAGNOSIS — N182 Chronic kidney disease, stage 2 (mild): Secondary | ICD-10-CM | POA: Diagnosis not present

## 2016-09-12 DIAGNOSIS — I10 Essential (primary) hypertension: Secondary | ICD-10-CM | POA: Diagnosis not present

## 2016-09-12 MED ORDER — RAMIPRIL 5 MG PO CAPS: 5.0000 mg | ORAL_CAPSULE | Freq: Every day | ORAL | 3 refills | Status: DC

## 2016-09-12 NOTE — Assessment & Plan Note (Signed)
S: controlled on ramipril 5mg  daily.  BP Readings from Last 3 Encounters:  09/12/16 122/82  06/21/16 128/80  04/26/16 128/82  A/P: We discussed blood pressure goal of <140/90. Continue current meds:  At goal (rx had said BID and we changed to once a day)

## 2016-09-12 NOTE — Patient Instructions (Addendum)
Refilled altace- changed rx to once a day  No other changes  We will call you within a week or two about your referral to ENT to evaluate hearing loss and ear lesion. If you do not hear within 3 weeks, give us a call.    Schedule a physical early Am and come fasting

## 2016-09-12 NOTE — Assessment & Plan Note (Signed)
After stroke 2017 -  Started on eliquis as thought emoblic from low EF. Later taken off of this by cardiology and placed back on plavix since no documented evidence of atrial fibrillation.

## 2016-09-12 NOTE — Progress Notes (Signed)
Subjective:  ESGAR BARNICK is a 66 y.o. year old very pleasant male patient who presents for/with See problem oriented charting ROS- No chest pain or shortness of breath. No headache or blurry vision. Is having hearing loss. Notes lesion behind left ear.    Past Medical History-  Patient Active Problem List   Diagnosis Date Noted  . Chronic systolic CHF (congestive heart failure) (HCC) 01/25/2015    Priority: High  . Cardiomyopathy, ischemic 01/17/2015    Priority: High  . Type 2 diabetes mellitus with renal manifestations, controlled (HCC) 06/14/2011    Priority: High  . History of CVA (cerebrovascular accident) 12/19/2006    Priority: High  . CAD S/P LAD PCI'03, RCA DES 01/16/15 08/11/2006    Priority: High  . CKD (chronic kidney disease), stage III 01/17/2015    Priority: Medium  . Dyslipidemia 08/11/2006    Priority: Medium  . Essential hypertension 08/11/2006    Priority: Medium  . LBBB (left bundle branch block) 01/17/2015    Priority: Low  . SVT (supraventricular tachycardia) (HCC) 01/14/2015    Priority: Low  . Former smoker 06/24/2014    Priority: Low  . Eczema of both hands 12/15/2009    Priority: Low  . Headache 02/16/2016  . Chronic systolic dysfunction of left ventricle 01/10/2016  . Occipital infarction (HCC) 04/04/2015  . Hypothyroidism 02/17/2015    Medications- reviewed and updated Current Outpatient Prescriptions  Medication Sig Dispense Refill  . acetaminophen (TYLENOL) 325 MG tablet Take 2 tablets (650 mg total) by mouth every 4 (four) hours as needed for headache or mild pain.    . carvedilol (COREG) 6.25 MG tablet take 1 tablet by mouth twice a day 60 tablet 2  . clopidogrel (PLAVIX) 75 MG tablet Take 1 tablet (75 mg total) by mouth daily. 90 tablet 3  . Coenzyme Q10 60 MG CAPS Take 1 capsule by mouth daily.     [name] glucose blood (ONETOUCH VERIO) test strip Use as instructed 100 each 12  . insulin aspart (NOVOLOG FLEXPEN) 100 UNIT/ML FlexPen none at  breakfast, 8 with lunch, and 16 with supper, and pen needles 4/day. 15 mL 11  . Insulin Glargine (BASAGLAR KWIKPEN) 100 UNIT/ML SOPN Inject 0.2 mLs (20 Units total) into the skin 2 (two) times daily. 5 pen 11  . Insulin Pen Needle 31G X 5 MM MISC Use to inject insulin 4 times per day. 200 each 5  . levothyroxine (SYNTHROID, LEVOTHROID) 75 MCG tablet Take 1 tablet (75 mcg total) by mouth daily before breakfast. 90 tablet 3  . nitroGLYCERIN (NITROSTAT) 0.4 MG SL tablet Place 1 tablet (0.4 mg total) under the tongue every 5 (five) minutes as needed for chest pain. Chest pain 30 tablet 5  . Probiotic Product (PROBIOTIC PO) Take 1 capsule by mouth every morning.    . ramipril (ALTACE) 5 MG capsule Take 1 capsule (5 mg total) by mouth daily. 90 capsule 3  . rosuvastatin (CRESTOR) 40 MG tablet TAKE 1 TABLET BY MOUTH DAILY 90 tablet 0   No current facility-administered medications for this visit.     Objective: BP 122/82 (BP Location: Left Arm, Patient Position: Sitting, Cuff Size: Large)   Pulse 63   Temp 97.9 F (36.6 C) (Oral)   Ht 5\' 9"  (1.753 m)   Wt 183 lb 3.2 oz (83.1 kg)   SpO2 95%   BMI 27.05 kg/m  Gen: NAD, resting comfortably Ear: posterior portion of left ear with portion on the earlobe- 12 mm  lesion that is soft and easily moveable.  CV: RRR no murmurs rubs or gallops Lungs: CTAB no crackles, wheeze, rhonchi Ext: no edema Skin: warm, dry Needs me to repeat myself at times.   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  Has noted as of the last year that he is having issues with both ears - wife and kids are all noticing.   Also noted a growth on posterior portion of ear for at least a year- seemed to start out about the size of a pimple now much larger. Irritates him and seems to have grown slightly in last year though  stable over last few months. He has tried to "pop" it and asks me about lancing it A/P:Plan: Ambulatory referral to ENT 1. Hearing loss- will have ENT/audiology at ENT evaluate hearing loss and get him set up with hearing aids most likely 2. I suspect lesion behind ear is a lipoma. Cyst possible but lean toward lipoma. Will ask for their opinion about removal given growing size  Essential hypertension S: controlled on ramipril 5mg  daily.  BP Readings from Last 3 Encounters:  09/12/16 122/82  06/21/16 128/80  04/26/16 128/82  A/P: We discussed blood pressure goal of <140/90. Continue current meds:  At goal (rx had said BID and we changed to once a day)   History of CVA (cerebrovascular accident) After stroke 2017 -  Started on eliquis as thought emoblic from low EF. Later taken off of this by cardiology and placed back on plavix since no documented evidence of atrial fibrillation.   Advised to schedule CPE/biometric screening  Orders Placed This Encounter  Procedures  . Ambulatory referral to ENT    Referral Priority:   Routine    Referral Type:   Consultation    Referral Reason:   Specialty Services Required    Requested Specialty:   Otolaryngology    Number of Visits Requested:   1    Meds ordered this encounter  Medications  . ramipril (ALTACE) 5 MG capsule    Sig: Take 1 capsule (5 mg total) by mouth daily.    Dispense:  90 capsule    Refill:  3    Return precautions advised.  Tana ConchStephen Devanta Daniel, MD

## 2016-09-13 ENCOUNTER — Ambulatory Visit (INDEPENDENT_AMBULATORY_CARE_PROVIDER_SITE_OTHER): Payer: Self-pay

## 2016-09-13 DIAGNOSIS — I5022 Chronic systolic (congestive) heart failure: Secondary | ICD-10-CM

## 2016-09-13 NOTE — Progress Notes (Signed)
EPIC Encounter for ICM Monitoring  Patient Name: John BolusStephen A Carrillo is a 66 y.[name] male Date: 09/13/2016 Primary Care Physican: Shelva MajesticHunter, John O, MD Primary Cardiologist: John Carrillo Electrophysiologist: Allred Dry Weight:175lbs  Bi-V Pacing:97.8%       Heart Failure questions reviewed, pt reported [place] lot.    Thoracic impedance close to baseline.  No diuretic  Recommendations:  Patient has requested to have transmissions reviewed every 3 months due to the insurance is not covering the charge and he owes monthly for service.  Advised device clinic will review every 3 months and will only call if fluid level is abnormal and he stated that is fine.    [facility] Dr [facility] FrameAllred.    Follow-up plan: ICM clinic phone appointment on 10/14/2016.   Copy of ICM check sent to device physician.   1 year ICM trend: 09/13/2016   3 month ICM trend:      Karie SodaLaurie S Ulyana Pitones, RN 09/13/2016 12:16 PM

## 2016-09-17 ENCOUNTER — Other Ambulatory Visit: Payer: Self-pay | Admitting: Physician Assistant

## 2016-09-17 DIAGNOSIS — E785 Hyperlipidemia, unspecified: Secondary | ICD-10-CM

## 2016-09-17 DIAGNOSIS — Z9861 Coronary angioplasty status: Secondary | ICD-10-CM

## 2016-09-17 DIAGNOSIS — I2510 Atherosclerotic heart disease of native coronary artery without angina pectoris: Secondary | ICD-10-CM

## 2016-10-14 ENCOUNTER — Telehealth: Payer: Self-pay | Admitting: Cardiology

## 2016-10-14 ENCOUNTER — Other Ambulatory Visit: Payer: Self-pay | Admitting: Cardiovascular Disease

## 2016-10-14 ENCOUNTER — Ambulatory Visit (INDEPENDENT_AMBULATORY_CARE_PROVIDER_SITE_OTHER): Payer: 59 | Admitting: *Deleted

## 2016-10-14 DIAGNOSIS — I255 Ischemic cardiomyopathy: Secondary | ICD-10-CM | POA: Diagnosis not present

## 2016-10-14 NOTE — Telephone Encounter (Signed)
Spoke with pt and reminded pt of remote transmission that is due today. Pt verbalized understanding.   

## 2016-10-15 LAB — CUP PACEART REMOTE DEVICE CHECK
Brady Statistic AP VP Percent: 22.87 %
Brady Statistic AP VS Percent: 0.39 %
Brady Statistic AS VS Percent: 1.31 %
Brady Statistic RV Percent Paced: 14.71 %
Date Time Interrogation Session: 20180910233636
HIGH POWER IMPEDANCE MEASURED VALUE: 69 Ohm
Implantable Lead Implant Date: 20171206
Implantable Lead Implant Date: 20171206
Implantable Lead Location: 753858
Implantable Lead Location: 753860
Implantable Pulse Generator Implant Date: 20171206
Lead Channel Impedance Value: 184.154
Lead Channel Impedance Value: 191.854
Lead Channel Impedance Value: 208.568
Lead Channel Impedance Value: 323 Ohm
Lead Channel Impedance Value: 380 Ohm
Lead Channel Impedance Value: 399 Ohm
Lead Channel Impedance Value: 437 Ohm
Lead Channel Impedance Value: 646 Ohm
Lead Channel Impedance Value: 646 Ohm
Lead Channel Impedance Value: 665 Ohm
Lead Channel Impedance Value: 703 Ohm
Lead Channel Pacing Threshold Amplitude: 0.5 V
Lead Channel Pacing Threshold Amplitude: 0.875 V
Lead Channel Pacing Threshold Amplitude: 0.875 V
Lead Channel Pacing Threshold Pulse Width: 0.4 ms
Lead Channel Sensing Intrinsic Amplitude: 14.75 mV
Lead Channel Setting Pacing Amplitude: 2.5 V
Lead Channel Setting Pacing Pulse Width: 0.4 ms
MDC IDC LEAD IMPLANT DT: 20171206
MDC IDC LEAD LOCATION: 753859
MDC IDC MSMT BATTERY REMAINING LONGEVITY: 113 mo
MDC IDC MSMT BATTERY VOLTAGE: 3.03 V
MDC IDC MSMT LEADCHNL LV IMPEDANCE VALUE: 180 Ohm
MDC IDC MSMT LEADCHNL LV IMPEDANCE VALUE: 203.256
MDC IDC MSMT LEADCHNL LV IMPEDANCE VALUE: 342 Ohm
MDC IDC MSMT LEADCHNL LV IMPEDANCE VALUE: 437 Ohm
MDC IDC MSMT LEADCHNL LV IMPEDANCE VALUE: 456 Ohm
MDC IDC MSMT LEADCHNL LV IMPEDANCE VALUE: 703 Ohm
MDC IDC MSMT LEADCHNL LV PACING THRESHOLD PULSEWIDTH: 0.4 ms
MDC IDC MSMT LEADCHNL RA PACING THRESHOLD PULSEWIDTH: 0.4 ms
MDC IDC MSMT LEADCHNL RA SENSING INTR AMPL: 2.5 mV
MDC IDC MSMT LEADCHNL RA SENSING INTR AMPL: 2.5 mV
MDC IDC MSMT LEADCHNL RV IMPEDANCE VALUE: 437 Ohm
MDC IDC MSMT LEADCHNL RV SENSING INTR AMPL: 14.75 mV
MDC IDC SET LEADCHNL LV PACING AMPLITUDE: 1.5 V
MDC IDC SET LEADCHNL RA PACING AMPLITUDE: 2 V
MDC IDC SET LEADCHNL RV PACING PULSEWIDTH: 0.4 ms
MDC IDC SET LEADCHNL RV SENSING SENSITIVITY: 0.3 mV
MDC IDC STAT BRADY AS VP PERCENT: 75.43 %
MDC IDC STAT BRADY RA PERCENT PACED: 23.07 %

## 2016-10-15 NOTE — Progress Notes (Signed)
Remote ICD transmission.   

## 2016-10-15 NOTE — Telephone Encounter (Signed)
Patient Instructions by Oleta Mouseverton, Shana M, CMA at 03/01/2016 12:20 PM   Author: Oleta Mouseverton, Shana M, CMA Author Type: Certified Medical Assistant Filed: 03/01/2016 12:54 PM  Note Status: Addendum Cosign: Cosign Not Required Encounter Date: 03/01/2016  Editor: Oleta Mouseverton, Shana M, CMA (Certified Medical Assistant)  Prior Versions: 1. Oleta Mouseverton, Shana M, CMA (Certified Medical Assistant) at 03/01/2016 12:51 PM - Signed    Medication Instructions:   STOP TAKING ELIQUIS   START TAKING COREG 6.25 TWICE A DAY

## 2016-10-16 ENCOUNTER — Encounter: Payer: Self-pay | Admitting: Cardiology

## 2016-10-22 DIAGNOSIS — L720 Epidermal cyst: Secondary | ICD-10-CM | POA: Insufficient documentation

## 2016-10-25 ENCOUNTER — Telehealth: Payer: Self-pay | Admitting: Endocrinology

## 2016-10-25 ENCOUNTER — Ambulatory Visit (INDEPENDENT_AMBULATORY_CARE_PROVIDER_SITE_OTHER): Payer: 59 | Admitting: Endocrinology

## 2016-10-25 ENCOUNTER — Other Ambulatory Visit: Payer: Self-pay

## 2016-10-25 ENCOUNTER — Encounter: Payer: Self-pay | Admitting: Endocrinology

## 2016-10-25 VITALS — BP 138/84 | HR 62 | Wt 178.4 lb

## 2016-10-25 DIAGNOSIS — E1122 Type 2 diabetes mellitus with diabetic chronic kidney disease: Secondary | ICD-10-CM

## 2016-10-25 DIAGNOSIS — N183 Chronic kidney disease, stage 3 unspecified: Secondary | ICD-10-CM

## 2016-10-25 DIAGNOSIS — Z794 Long term (current) use of insulin: Secondary | ICD-10-CM | POA: Diagnosis not present

## 2016-10-25 LAB — POCT GLYCOSYLATED HEMOGLOBIN (HGB A1C): HEMOGLOBIN A1C: 7.9

## 2016-10-25 MED ORDER — BASAGLAR KWIKPEN 100 UNIT/ML ~~LOC~~ SOPN: 20.0000 [IU] | PEN_INJECTOR | Freq: Two times a day (BID) | SUBCUTANEOUS | 11 refills | Status: DC

## 2016-10-25 MED ORDER — GLUCOSE BLOOD VI STRP: 1.0000 | ORAL_STRIP | Freq: Two times a day (BID) | 12 refills | Status: AC

## 2016-10-25 MED ORDER — INSULIN PEN NEEDLE 31G X 5 MM MISC: 5 refills | Status: DC

## 2016-10-25 NOTE — Progress Notes (Signed)
Subjective:    Patient ID: John Carrillo, male    DOB: [birth year], 66 y.o.   MRN: [id number]  HPI Pt returns for f/u of diabetes mellitus:  DM type: 1 Dx'ed: 2008. Complications: CVA, PAD, renal insufficiency, and CAD.   Therapy: insulin since soon after dx.  DKA: never.  Severe hypoglycemia: never.  Pancreatitis: never.   Other: he declines pump, including V-GO; he takes multiple daily injections; He says his ability to care for his DM has been compromised by wife's illness.   Interval history: He seldom has hypoglycemia, and these episodes are mild.  This happens in the afternoon.   He takes basaglar, 12 units qam and 18 units qpm.  Past Medical History:  Diagnosis Date  . Acute kidney injury superimposed on chronic kidney disease (HCC) 01/25/2015  . CORONARY ARTERY DISEASE 08/11/2006  . CVA 12/19/2006   2008  . DIABETES MELLITUS, TYPE I 08/11/2006  . ECZEMA, HANDS 12/15/2009  . HYPERLIPIDEMIA 08/11/2006  . HYPERTENSION 08/11/2006    Past Surgical History:  Procedure Laterality Date  . CARDIAC CATHETERIZATION N/A 01/16/2015   Procedure: Left Heart Cath and Coronary Angiography;  Surgeon: [name], MD;  Location: [facility] INVASIVE CV LAB;  Service: Cardiovascular;  Laterality: N/A;  . CORONARY STENT PLACEMENT    . EP IMPLANTABLE DEVICE N/A 01/10/2016   MDT Claria MRI Quad CRTD implanted by Dr [name]  . TEE WITHOUT CARDIOVERSION N/A 01/26/2015   Procedure: TRANSESOPHAGEAL ECHOCARDIOGRAM (TEE);  Surgeon: [name], MD;  Location: [facility] ENDOSCOPY;  Service: Cardiovascular;  Laterality: N/A;  . TONSILECTOMY, ADENOIDECTOMY, BILATERAL MYRINGOTOMY AND TUBES      Social History   Social History  . Marital status: Married    Spouse name: N/A  . Number of children: N/A  . Years of education: N/A   Occupational History  . Writer   Social History Main Topics  . Smoking status: Former Smoker    Packs/day: 1.00    Years: 30.00    Types: Cigarettes     Quit date: 05/03/2000  . Smokeless tobacco: Never Used  . Alcohol use 1.8 oz/week    1 Glasses of wine, 1 Cans of beer, 1 Shots of liquor per week     Comment: 1-2/month  . Drug use: No  . Sexual activity: Not on file   Other Topics Concern  . Not on file   Social History Narrative   Married (kids are patients here, wife seen elsewhere). 3 step children, 1 biological. No grandkids.    In late 2016:   29 yo sophomore at [employer]- consider age 74 retirement   Oldest daughter - college [employer] attorney- social work intl business minor religion      Emergency planning/management officer for [employer]      Hobbies: race sail boats, adrenaline related activities    Current Outpatient Prescriptions on File Prior to Visit  Medication Sig Dispense Refill  . acetaminophen (TYLENOL) 325 MG tablet Take 2 tablets (650 mg total) by mouth every 4 (four) hours as needed for headache or mild pain.    . carvedilol (COREG) 6.25 MG tablet TAKE 1 TABLET BY MOUTH TWICE DAILY 60 tablet 0  . clopidogrel (PLAVIX) 75 MG tablet Take 1 tablet (75 mg total) by mouth daily. 90 tablet 3  . Coenzyme Q10 60 MG CAPS Take 1 capsule by mouth daily.     . insulin aspart (NOVOLOG FLEXPEN) 100 UNIT/ML FlexPen none at breakfast, 8 with lunch, and 16  with supper, and pen needles 4/day. 15 mL 11  . levothyroxine (SYNTHROID, LEVOTHROID) 75 MCG tablet Take 1 tablet (75 mcg total) by mouth daily before breakfast. 90 tablet 3  . nitroGLYCERIN (NITROSTAT) 0.4 MG SL tablet Place 1 tablet (0.4 mg total) under the tongue every 5 (five) minutes as needed for chest pain. Chest pain 30 tablet 5  . Probiotic Product (PROBIOTIC PO) Take 1 capsule by mouth every morning.    . ramipril (ALTACE) 5 MG capsule Take 1 capsule (5 mg total) by mouth daily. 90 capsule 3  . rosuvastatin (CRESTOR) 40 MG tablet TAKE 1 TABLET BY MOUTH ONCE DAILY 90 tablet 1   No current facility-administered medications on file prior to visit.     No Known Allergies  Family  History  Problem Relation Age of Onset  . CAD Father   . CAD Paternal Grandfather        45s  . CAD Paternal Grandmother        22s  . Brain cancer Maternal Grandmother   . Diabetes Neg Hx     BP 138/84   Pulse 62   Wt 178 lb 6.4 oz (80.9 kg)   SpO2 97%   BMI 26.35 kg/m    Review of Systems Denies LOC    Objective:   Physical Exam VITAL SIGNS:  See vs page GENERAL: no distress Pulses: foot pulses are intact bilaterally.   MSK: no deformity of the feet or ankles.  CV: no edema of the legs or ankles Skin:  no ulcer on the feet or ankles.  normal color and temp on the feet and ankles Neuro: sensation is intact to touch on the feet and ankles.    A1c=7.9%     Assessment & Plan:  Insulin-requiring type 2 DM, with PAD: he needs increased rx   Patient Instructions  check your blood sugar 4 times a day--before the 3 meals, and at bedtime.  also check if you have symptoms of your blood sugar being too high or too low.  please keep a record of the readings and bring it to your next appointment here.  please call us sooner if you are having low blood sugar episodes.   Please come back a follow-up appointment in 4 months.   Please change the insulins to the numbers listed below.

## 2016-10-25 NOTE — Patient Instructions (Addendum)
check your blood sugar 4 times a day--before the 3 meals, and at bedtime.  also check if you have symptoms of your blood sugar being too high or too low.  please keep a record of the readings and bring it to your next appointment here.  please call us sooner if you are having low blood sugar episodes.   Please come back a follow-up appointment in 4 months.   Please change the insulins to the numbers listed below.

## 2016-10-25 NOTE — Telephone Encounter (Signed)
12 units qam and 20 units qpm

## 2016-10-25 NOTE — Telephone Encounter (Signed)
Called and clarified

## 2016-10-25 NOTE — Telephone Encounter (Signed)
Pharmacy needs clarification on the directions for the Insulin Glargine (BASAGLAR KWIKPEN) 100 UNIT/ML SOPN. They received two separate instructions. Call pharmacy to clarify.

## 2016-10-25 NOTE — Telephone Encounter (Signed)
Which dosages are correct?

## 2016-11-11 ENCOUNTER — Other Ambulatory Visit: Payer: Self-pay | Admitting: Nurse Practitioner

## 2016-11-22 ENCOUNTER — Encounter: Payer: 59 | Admitting: Family Medicine

## 2016-11-22 ENCOUNTER — Telehealth: Payer: Self-pay | Admitting: Family Medicine

## 2016-11-22 NOTE — Telephone Encounter (Signed)
Patient was rescheduled to December 14 for a physical due to provider being sick. Is there any flexibility with Dr. Erasmo LeventhalHunter's schedule to where he could be seen sooner? Patient can only be seen every other Friday and needs mornings. Call patient to advise, patient was very understanding.

## 2016-11-25 NOTE — Telephone Encounter (Signed)
Diannia RuderKara- I am flexible on this - are there any potential spots that work for him?

## 2016-11-27 NOTE — Progress Notes (Signed)
Electrophysiology Office Note Date: 11/28/2016  ID:  John Carrillo, DOB 12/04/1950, MRN [id number]  PCP: John MajesticHunter, [name], MD Primary Cardiologist: John Carrillo Electrophysiologist: John Carrillo  CC: CRT follow up  John Carrillo is a 66 y.[name] male seen today for John [name]  He presents today for CRT follow up. He reports a significant improvement in HF symptoms since implant. Repeat echo demonstrated persistently decreased EF.  He was referred for AV opt echo, but this was never scheduled. He has had some increased stress over the summer as his mom passed away while he was on vacation.  He is starting to get back into the gym.  He denies chest pain, dyspnea, PND, orthopnea, nausea, vomiting, dizziness, syncope, edema, weight gain, or early satiety.  He has not had ICD shocks.   Device History: MDT CRTD implanted 2017 for ICM, CHF History of appropriate therapy: No History of AAD therapy: No   Past Medical History:  Diagnosis Date  . Acute kidney injury superimposed on chronic kidney disease (HCC) 01/25/2015  . CORONARY ARTERY DISEASE 08/11/2006  . CVA 12/19/2006   2008  . DIABETES MELLITUS, TYPE I 08/11/2006  . ECZEMA, HANDS 12/15/2009  . HYPERLIPIDEMIA 08/11/2006  . HYPERTENSION 08/11/2006   Past Surgical History:  Procedure Laterality Date  . CARDIAC CATHETERIZATION N/A 01/16/2015   Procedure: Left Heart Cath and Coronary Angiography;  Surgeon: John GessJonathan J Berry, MD;  [facility];  Service: Cardiovascular;  Laterality: N/A;  . CORONARY STENT PLACEMENT    . EP IMPLANTABLE DEVICE N/A 01/10/2016   MDT Claria MRI Quad CRTD implanted by John Carrillo  . TEE WITHOUT CARDIOVERSION N/A 01/26/2015   Procedure: TRANSESOPHAGEAL ECHOCARDIOGRAM (TEE);  Surgeon: John FairMihai Croitoru, MD;  [facility];  Service: Cardiovascular;  Laterality: N/A;  . TONSILECTOMY, ADENOIDECTOMY, BILATERAL MYRINGOTOMY AND TUBES      Current Outpatient Prescriptions  Medication Sig Dispense Refill  .  acetaminophen (TYLENOL) 325 MG tablet Take 2 tablets (650 mg total) by mouth every 4 (four) hours as needed for headache or mild pain.    . carvedilol (COREG) 6.25 MG tablet TAKE 1 TABLET BY MOUTH TWICE DAILY 60 tablet 1  . clopidogrel (PLAVIX) 75 MG tablet Take 1 tablet (75 mg total) by mouth daily. 90 tablet 3  . Coenzyme Q10 60 MG CAPS Take 1 capsule by mouth daily.     [name]. glucose blood (ONETOUCH VERIO) test strip 1 each by Other route 2 (two) times daily. And lancets 2/day 200 each 12  . insulin aspart (NOVOLOG FLEXPEN) 100 UNIT/ML FlexPen none at breakfast, 8 with lunch, and 16 with supper, and pen needles 4/day. 15 mL 11  . Insulin Glargine (BASAGLAR KWIKPEN) 100 UNIT/ML SOPN Inject 0.2 mLs (20 Units total) into the skin 2 (two) times daily. 12 units each morning, and 20 units each evening. 5 pen 11  . Insulin Pen Needle 31G X 5 MM MISC Use to inject insulin 4 times per day. 200 each 5  . levothyroxine (SYNTHROID, LEVOTHROID) 75 MCG tablet Take 1 tablet (75 mcg total) by mouth daily before breakfast. 90 tablet 3  . nitroGLYCERIN (NITROSTAT) 0.4 MG SL tablet Place 1 tablet (0.4 mg total) under the tongue every 5 (five) minutes as needed for chest pain. Chest pain 30 tablet 5  . Probiotic Product (PROBIOTIC PO) Take 1 capsule by mouth every morning.    . ramipril (ALTACE) 5 MG capsule Take 1 capsule (5 mg total) by mouth daily. 90 capsule 3  .  rosuvastatin (CRESTOR) 40 MG tablet TAKE 1 TABLET BY MOUTH ONCE DAILY 90 tablet 1   No current facility-administered medications for this visit.     Allergies:   Patient has no known allergies.   Social History: Social History   Social History  . Marital status: Married    Spouse name: N/A  . Number of children: N/A  . Years of education: N/A   Occupational History  . Writer   Social History Main Topics  . Smoking status: Former Smoker    Packs/day: 1.00    Years: 30.00    Types: Cigarettes    Quit date:  05/03/2000  . Smokeless tobacco: Never Used  . Alcohol use 1.8 oz/week    1 Glasses of wine, 1 Cans of beer, 1 Shots of liquor per week     Comment: 1-2/month  . Drug use: No  . Sexual activity: Not on file   Other Topics Concern  . Not on file   Social History Narrative   Married (kids are patients here, wife seen elsewhere). 3 step children, 1 biological. No grandkids.    In late 2016:   32 yo sophomore at [employer]- consider age 3 retirement   Oldest daughter - college of [employer] attorney- social work intl business minor religion      Emergency planning/management officer for [employer]      Hobbies: race sail boats, adrenaline related activities    Family History: Family History  Problem Relation Age of Onset  . CAD Father   . CAD Paternal Grandfather        18s  . CAD Paternal Grandmother        79s  . Brain cancer Maternal Grandmother   . Diabetes Neg Hx     Review of Systems: All other systems reviewed and are otherwise negative except as noted above.   Physical Exam: VS:  BP 132/74   Pulse 60   Ht 5\' 9"  (1.753 m)   Wt 180 lb (81.6 kg)   BMI 26.58 kg/m  , BMI Body mass index is 26.58 kg/m.  GEN- The patient is well appearing, alert and oriented x 3 today.   HEENT: normocephalic, atraumatic; sclera clear, conjunctiva pink; hearing intact; oropharynx clear; neck supple  Lungs- Clear to ausculation bilaterally, normal work of breathing.  No wheezes, rales, rhonchi Heart- Regular rate and rhythm (paced) GI- soft, non-tender, non-distended, bowel sounds present  Extremities- no clubbing, cyanosis, or edema  MS- no significant deformity or atrophy Skin- warm and dry, no rash or lesion; ICD pocket well healed Psych- euthymic mood, full affect Neuro- strength and sensation are intact  ICD interrogation- reviewed in detail today,  See PACEART report  EKG:  EKG is ordered today. The ekg ordered today shows sinus rhythm with LV pacing   Recent Labs: 01/05/2016: Hemoglobin 14.7;  Platelets 206 02/16/2016: TSH 5.14 03/05/2016: BUN 22; Creatinine, Ser 1.34; Potassium 4.1; Sodium 142   Wt Readings from Last 3 Encounters:  11/28/16 180 lb (81.6 kg)  10/25/16 178 lb 6.4 oz (80.9 kg)  09/12/16 183 lb 3.2 oz (83.1 kg)     Other studies Reviewed: Additional studies/ records that were reviewed today include: John Jenel Lucks office notes, hospital records   Assessment and Plan:  1.  Chronic systolic dysfunction euvolemic today Stable on an appropriate medical regimen Normal ICD function See Pace Art report No changes today Continue follow up in ICM clinic  EF remains depressed post CRT. Will schedule AV opt  echo   2.  CAD No recent ischemic symptoms Continue medical therapy  3.  Prior stroke No AF documented to date Will continue to follow    4.  Sustained VT No recurrence Continue medical therapy    Current medicines are reviewed at length with the patient today.   The patient does not have concerns regarding his medicines.  The following changes were made today:  Stop Eliquis, increase Coreg to 6.25mg  twice daily   Labs/ tests ordered today include: AV opt echo  Orders Placed This Encounter  Procedures  . CUP PACEART INCLINIC DEVICE CHECK     Disposition:   Follow up with John John Emms as scheduled, ICM clinic, me in 6 months      Signed, Gypsy Balsam, NP 11/28/2016 8:17 AM  [facility] [address] [place] [address] [phone] (office) [phone] (fax

## 2016-11-28 ENCOUNTER — Encounter (INDEPENDENT_AMBULATORY_CARE_PROVIDER_SITE_OTHER): Payer: Self-pay

## 2016-11-28 ENCOUNTER — Ambulatory Visit (INDEPENDENT_AMBULATORY_CARE_PROVIDER_SITE_OTHER): Payer: 59 | Admitting: Nurse Practitioner

## 2016-11-28 VITALS — BP 132/74 | HR 60 | Ht 69.0 in | Wt 180.0 lb

## 2016-11-28 DIAGNOSIS — I5022 Chronic systolic (congestive) heart failure: Secondary | ICD-10-CM

## 2016-11-28 DIAGNOSIS — I639 Cerebral infarction, unspecified: Secondary | ICD-10-CM | POA: Diagnosis not present

## 2016-11-28 DIAGNOSIS — I255 Ischemic cardiomyopathy: Secondary | ICD-10-CM

## 2016-11-28 DIAGNOSIS — I472 Ventricular tachycardia, unspecified: Secondary | ICD-10-CM

## 2016-11-28 LAB — CUP PACEART INCLINIC DEVICE CHECK
Implantable Lead Implant Date: 20171206
Implantable Lead Implant Date: 20171206
Implantable Lead Location: 753858
Implantable Lead Model: 4598
MDC IDC LEAD IMPLANT DT: 20171206
MDC IDC LEAD LOCATION: 753859
MDC IDC LEAD LOCATION: 753860
MDC IDC PG IMPLANT DT: 20171206
MDC IDC SESS DTM: 20181025080606

## 2016-11-28 LAB — BASIC METABOLIC PANEL
BUN / CREAT RATIO: 12 (ref 10–24)
BUN: 14 mg/dL (ref 8–27)
CO2: 26 mmol/L (ref 20–29)
CREATININE: 1.15 mg/dL (ref 0.76–1.27)
Calcium: 8.8 mg/dL (ref 8.6–10.2)
Chloride: 104 mmol/L (ref 96–106)
GFR calc Af Amer: 77 mL/min/{1.73_m2} (ref >59)
GFR, EST NON AFRICAN AMERICAN: 66 mL/min/{1.73_m2} (ref >59)
Glucose: 113 mg/dL — ABNORMAL HIGH (ref 65–99)
Potassium: 4.5 mmol/L (ref 3.5–5.2)
SODIUM: 142 mmol/L (ref 134–144)

## 2016-11-28 NOTE — Patient Instructions (Addendum)
Medication Instructions:   Your physician recommends that you continue on your current medications as directed. Please refer to the Current Medication list given to you today.   If you need a refill on your cardiac medications before your next appointment, please call your pharmacy.  Labwork: BMET TODAY    Testing/Procedures: Your physician has requested that you have an  AV-OP echocardiogram WITH SEILER ON FRIDAY. Echocardiography is a painless test that uses sound waves to create images of your heart. It provides your doctor with information about the size and shape of your heart and how well your heart's chambers and valves are working. This procedure takes approximately one hour. There are no restrictions for this procedure.     Follow-Up: Your physician wants you to follow-up in:  IN  6  MONTHS WITH  SEILER   You will receive a reminder letter in the mail two months in advance. If you don't receive a letter, please call our office to schedule the follow-up appointment.      Any Other Special Instructions Will Be Listed Below (If Applicable).

## 2016-12-09 ENCOUNTER — Other Ambulatory Visit: Payer: Self-pay | Admitting: Nurse Practitioner

## 2016-12-09 ENCOUNTER — Ambulatory Visit (HOSPITAL_COMMUNITY): Payer: 59 | Attending: Cardiology

## 2016-12-09 ENCOUNTER — Ambulatory Visit (INDEPENDENT_AMBULATORY_CARE_PROVIDER_SITE_OTHER): Payer: 59 | Admitting: Nurse Practitioner

## 2016-12-09 ENCOUNTER — Other Ambulatory Visit: Payer: Self-pay

## 2016-12-09 DIAGNOSIS — I2510 Atherosclerotic heart disease of native coronary artery without angina pectoris: Secondary | ICD-10-CM | POA: Diagnosis not present

## 2016-12-09 DIAGNOSIS — E785 Hyperlipidemia, unspecified: Secondary | ICD-10-CM | POA: Insufficient documentation

## 2016-12-09 DIAGNOSIS — I5022 Chronic systolic (congestive) heart failure: Secondary | ICD-10-CM | POA: Diagnosis present

## 2016-12-09 DIAGNOSIS — Z8673 Personal history of transient ischemic attack (TIA), and cerebral infarction without residual deficits: Secondary | ICD-10-CM | POA: Insufficient documentation

## 2016-12-09 DIAGNOSIS — I110 Hypertensive heart disease with heart failure: Secondary | ICD-10-CM | POA: Diagnosis not present

## 2016-12-09 DIAGNOSIS — E119 Type 2 diabetes mellitus without complications: Secondary | ICD-10-CM | POA: Diagnosis not present

## 2016-12-09 NOTE — Progress Notes (Signed)
    AV Optimization Echo Note Date: 12/09/2016  ID:  Jethro BolusStephen A Budzinski, DOB [birth year], MRN [id number]  PCP: Shelva MajesticHunter, Atharva O, MD Primary Cardiologist: [name] Electrophysiologist: Catalina AntiguaAllred  John Carrillo is a 66 y.o. male is seen today for Dr Johney FrameAllred.  He presents today for AV optimization of CRT.  Since last being seen in our clinic, the patient reports doing very well.    The patient is predominately atrially sensed.  Device was programmed with a  PAV of 170 and SAV of 110 on presentation.  Paced and sensed AV delays were evaluated from 110 msec to 190 msec.  Optimal separation of E/A waves was noted at PAV of 170 msec(A pacing on presentation).  Aortic VTI was also evaluated with VV delays.  Optimal filling was noted at simultaneous pacing  Device was left programmed with adaptive CRT on as E/A wave separation and aortic VTI was best at these device defined numbers  The patient will return to see me in clinic as scheduled   Signed, Gypsy BalsamAmber Aries Townley, NP  12/09/2016 8:33 AM     [facility] [address] [place] [address] [phone] (office) [phone] (fax)

## 2016-12-14 ENCOUNTER — Other Ambulatory Visit: Payer: Self-pay | Admitting: Internal Medicine

## 2016-12-20 DIAGNOSIS — E119 Type 2 diabetes mellitus without complications: Secondary | ICD-10-CM

## 2016-12-20 HISTORY — DX: Type 2 diabetes mellitus without complications: E11.9

## 2016-12-20 LAB — HM DIABETES EYE EXAM

## 2016-12-25 ENCOUNTER — Encounter: Payer: Self-pay | Admitting: Family Medicine

## 2017-01-03 ENCOUNTER — Encounter: Payer: Self-pay | Admitting: Family Medicine

## 2017-01-03 ENCOUNTER — Ambulatory Visit (INDEPENDENT_AMBULATORY_CARE_PROVIDER_SITE_OTHER): Payer: 59 | Admitting: Family Medicine

## 2017-01-03 VITALS — BP 120/70 | HR 60 | Temp 98.0°F | Resp 97 | Ht 68.5 in | Wt 180.2 lb

## 2017-01-03 DIAGNOSIS — Z9861 Coronary angioplasty status: Secondary | ICD-10-CM | POA: Diagnosis not present

## 2017-01-03 DIAGNOSIS — Z794 Long term (current) use of insulin: Secondary | ICD-10-CM

## 2017-01-03 DIAGNOSIS — E1122 Type 2 diabetes mellitus with diabetic chronic kidney disease: Secondary | ICD-10-CM | POA: Diagnosis not present

## 2017-01-03 DIAGNOSIS — E039 Hypothyroidism, unspecified: Secondary | ICD-10-CM

## 2017-01-03 DIAGNOSIS — I10 Essential (primary) hypertension: Secondary | ICD-10-CM

## 2017-01-03 DIAGNOSIS — Z125 Encounter for screening for malignant neoplasm of prostate: Secondary | ICD-10-CM | POA: Diagnosis not present

## 2017-01-03 DIAGNOSIS — Z8673 Personal history of transient ischemic attack (TIA), and cerebral infarction without residual deficits: Secondary | ICD-10-CM

## 2017-01-03 DIAGNOSIS — E785 Hyperlipidemia, unspecified: Secondary | ICD-10-CM

## 2017-01-03 DIAGNOSIS — N183 Chronic kidney disease, stage 3 unspecified: Secondary | ICD-10-CM

## 2017-01-03 DIAGNOSIS — Z23 Encounter for immunization: Secondary | ICD-10-CM | POA: Diagnosis not present

## 2017-01-03 DIAGNOSIS — I5022 Chronic systolic (congestive) heart failure: Secondary | ICD-10-CM

## 2017-01-03 DIAGNOSIS — Z0000 Encounter for general adult medical examination without abnormal findings: Secondary | ICD-10-CM

## 2017-01-03 DIAGNOSIS — I2510 Atherosclerotic heart disease of native coronary artery without angina pectoris: Secondary | ICD-10-CM | POA: Diagnosis not present

## 2017-01-03 LAB — POC URINALSYSI DIPSTICK (AUTOMATED)
Bilirubin, UA: NEGATIVE
Blood, UA: NEGATIVE
Glucose, UA: NEGATIVE
KETONES UA: NEGATIVE
LEUKOCYTES UA: NEGATIVE
Nitrite, UA: NEGATIVE
PH UA: 6 (ref 5.0–8.0)
PROTEIN UA: NEGATIVE
SPEC GRAV UA: 1.02 (ref 1.010–1.025)
Urobilinogen, UA: 1 E.U./dL

## 2017-01-03 LAB — LIPID PANEL
Cholesterol: 179 mg/dL (ref 0–200)
HDL: 57.2 mg/dL (ref >39.00)
LDL Cholesterol: 98 mg/dL (ref 0–99)
NONHDL: 121.77
Total CHOL/HDL Ratio: 3
Triglycerides: 121 mg/dL (ref 0.0–149.0)
VLDL: 24.2 mg/dL (ref 0.0–40.0)

## 2017-01-03 LAB — COMPREHENSIVE METABOLIC PANEL
ALBUMIN: 4 g/dL (ref 3.5–5.2)
ALK PHOS: 61 U/L (ref 39–117)
ALT: 20 U/L (ref 0–53)
AST: 20 U/L (ref 0–37)
BILIRUBIN TOTAL: 0.5 mg/dL (ref 0.2–1.2)
BUN: 22 mg/dL (ref 6–23)
CO2: 32 mEq/L (ref 19–32)
Calcium: 9.5 mg/dL (ref 8.4–10.5)
Chloride: 106 mEq/L (ref 96–112)
Creatinine, Ser: 1.29 mg/dL (ref 0.40–1.50)
GFR: 59.35 mL/min — AB (ref >60.00)
GLUCOSE: 116 mg/dL — AB (ref 70–99)
POTASSIUM: 4.7 meq/L (ref 3.5–5.1)
Sodium: 141 mEq/L (ref 135–145)
TOTAL PROTEIN: 6.7 g/dL (ref 6.0–8.3)

## 2017-01-03 LAB — TSH: TSH: 3.62 u[IU]/mL (ref 0.35–4.50)

## 2017-01-03 LAB — HEMOGLOBIN A1C: Hgb A1c MFr Bld: 7.9 % — ABNORMAL HIGH (ref 4.6–6.5)

## 2017-01-03 LAB — CBC
HEMATOCRIT: 46.4 % (ref 39.0–52.0)
HEMOGLOBIN: 15.3 g/dL (ref 13.0–17.0)
MCHC: 33 g/dL (ref 30.0–36.0)
MCV: 101.7 fl — ABNORMAL HIGH (ref 78.0–100.0)
PLATELETS: 177 10*3/uL (ref 150.0–400.0)
RBC: 4.56 Mil/uL (ref 4.22–5.81)
RDW: 12.8 % (ref 11.5–15.5)
WBC: 7.6 10*3/uL (ref 4.0–10.5)

## 2017-01-03 LAB — PSA: PSA: 4.31 ng/mL — AB (ref 0.10–4.00)

## 2017-01-03 NOTE — Assessment & Plan Note (Signed)
CAD with history stent as recently 2016- asymptomatic- he is continuing cardiology follow up

## 2017-01-03 NOTE — Assessment & Plan Note (Signed)
Systolic CHF- after nstemi EF as low as 15%. On ramipril. Updated echo shows 25-30%. Has pacemaker/ICD

## 2017-01-03 NOTE — Assessment & Plan Note (Signed)
DM with renal manifestations (CKD III)- is supposed to follow with Dr. Everardo AllEllison. He is on insulin. a1c is at least under 8- prefer tighter with CKD but patient is working on it Lab Results  Component Value Date   HGBA1C 7.9 10/25/2016

## 2017-01-03 NOTE — Assessment & Plan Note (Signed)
History cva- on eliquis as thought embolic fromlow EF but later moved back to plavix with no evidence of atrial fibrillation. Stroke was 01/2016 (second cva)- residual effects from stroke - none, had some left eye reduced vision which resolved before discharge. No afib has been documented even with regular monitoring

## 2017-01-03 NOTE — Patient Instructions (Addendum)
Thanks for doing labs and pneumonia shot  John MuirJamie or Lupita LeashDonna will set you up for cologuard  I like your idea of dropping another 5 lbs over next year.   No other changes today

## 2017-01-03 NOTE — Assessment & Plan Note (Signed)
HTN- controlled on ramipril 5 mg, coreg 6.25mg  BID

## 2017-01-03 NOTE — Assessment & Plan Note (Signed)
HLD- on crestor 40mg  daily. Update lipids

## 2017-01-03 NOTE — Assessment & Plan Note (Signed)
.  Hypothyroidism- is on levothyroxine 75 mcg each day. Update tsh

## 2017-01-03 NOTE — Assessment & Plan Note (Signed)
CKD III- GFR just below 60 usually- but above at times- update BMP. Continue BP and lipid control. Knows to avoid nsaids

## 2017-01-03 NOTE — Progress Notes (Signed)
Phone: [phone]  Subjective:  Patient presents today for their annual physical. Chief complaint-noted.   See problem oriented charting- ROS- [phone]  review of systems was completed and negative except for: congestion, seasonal allergies  The following were reviewed and entered/updated in epic: Past Medical History:  Diagnosis Date  . Acute kidney injury superimposed on chronic kidney disease (HCC) 01/25/2015  . CORONARY ARTERY DISEASE 08/11/2006  . CVA 12/19/2006   2008  . DIABETES MELLITUS, TYPE I 08/11/2006  . ECZEMA, HANDS 12/15/2009  . HYPERLIPIDEMIA 08/11/2006  . HYPERTENSION 08/11/2006   Patient Active Problem List   Diagnosis Date Noted  . Chronic systolic CHF (congestive heart failure) (HCC) 01/25/2015    Priority: High  . Cardiomyopathy, ischemic 01/17/2015    Priority: High  . Type 2 diabetes mellitus with renal manifestations, controlled (HCC) 06/14/2011    Priority: High  . History of CVA (cerebrovascular accident) 12/19/2006    Priority: High  . CAD S/P LAD PCI'03, RCA DES 01/16/15 08/11/2006    Priority: High  . Hypothyroidism 02/17/2015    Priority: Medium  . CKD (chronic kidney disease), stage III (HCC) 01/17/2015    Priority: Medium  . Dyslipidemia 08/11/2006    Priority: Medium  . Essential hypertension 08/11/2006    Priority: Medium  . LBBB (left bundle branch block) 01/17/2015    Priority: Low  . SVT (supraventricular tachycardia) (HCC) 01/14/2015    Priority: Low  . Former smoker 06/24/2014    Priority: Low  . Eczema of both hands 12/15/2009    Priority: Low  . Epidermoid cyst of ear 10/22/2016  . Headache 02/16/2016  . Chronic systolic dysfunction of left ventricle 01/10/2016  . Occipital infarction (HCC) 04/04/2015   Past Surgical History:  Procedure Laterality Date  . CARDIAC CATHETERIZATION N/A 01/16/2015   Procedure: Left Heart Cath and Coronary Angiography;  Surgeon: Runell GessJonathan J Berry, MD;  [facility];  Service:  Cardiovascular;  Laterality: N/A;  . CORONARY STENT PLACEMENT    . EP IMPLANTABLE DEVICE N/A 01/10/2016   MDT Claria MRI Quad CRTD implanted by Dr Johney FrameAllred  . TEE WITHOUT CARDIOVERSION N/A 01/26/2015   Procedure: TRANSESOPHAGEAL ECHOCARDIOGRAM (TEE);  Surgeon: Thurmon FairMihai Croitoru, MD;  [facility];  Service: Cardiovascular;  Laterality: N/A;  . TONSILECTOMY, ADENOIDECTOMY, BILATERAL MYRINGOTOMY AND TUBES      Family History  Problem Relation Age of Onset  . CAD Father   . CAD Paternal Grandfather        7370s  . CAD Paternal Grandmother        3170s  . Brain cancer Maternal Grandmother   . Diabetes Neg Hx     Medications- reviewed and updated Current Outpatient Medications  Medication Sig Dispense Refill  . acetaminophen (TYLENOL) 325 MG tablet Take 2 tablets (650 mg total) by mouth every 4 (four) hours as needed for headache or mild pain.    . carvedilol (COREG) 6.25 MG tablet TAKE 1 TABLET BY MOUTH TWICE DAILY 60 tablet 1  . clopidogrel (PLAVIX) 75 MG tablet Take one (1) tablet (75 mg) by mouth daily. 90 tablet 3  . Coenzyme Q10 60 MG CAPS Take 1 capsule by mouth daily.     [name]. glucose blood (ONETOUCH VERIO) test strip 1 each by Other route 2 (two) times daily. And lancets 2/day 200 each 12  . insulin aspart (NOVOLOG FLEXPEN) 100 UNIT/ML FlexPen none at breakfast, 8 with lunch, and 16 with supper, and pen needles 4/day. 15 mL 11  . Insulin  Glargine (BASAGLAR KWIKPEN) 100 UNIT/ML SOPN Inject 0.2 mLs (20 Units total) into the skin 2 (two) times daily. 12 units each morning, and 20 units each evening. 5 pen 11  . Insulin Pen Needle 31G X 5 MM MISC Use to inject insulin 4 times per day. 200 each 5  . levothyroxine (SYNTHROID, LEVOTHROID) 75 MCG tablet Take 1 tablet (75 mcg total) by mouth daily before breakfast. 90 tablet 3  . nitroGLYCERIN (NITROSTAT) 0.4 MG SL tablet Place 1 tablet (0.4 mg total) under the tongue every 5 (five) minutes as needed for chest pain. Chest pain 30 tablet 5  .  Probiotic Product (PROBIOTIC PO) Take 1 capsule by mouth every morning.    . ramipril (ALTACE) 5 MG capsule Take 1 capsule (5 mg total) by mouth daily. 90 capsule 3  . rosuvastatin (CRESTOR) 40 MG tablet TAKE 1 TABLET BY MOUTH ONCE DAILY 90 tablet 1   No current facility-administered medications for this visit.     Allergies-reviewed and updated No Known Allergies  Social History   Socioeconomic History  . Marital status: Married    Spouse name: None  . Number of children: None  . Years of education: None  . Highest education level: None  Social Needs  . Financial resource strain: None  . Food insecurity - worry: None  . Food insecurity - inability: None  . Transportation needs - medical: None  . Transportation needs - non-medical: None  Occupational History  . Occupation: Engineer, maintenance: [employer]  Tobacco Use  . Smoking status: Former Smoker    Packs/day: 1.00    Years: 30.00    Pack years: 30.00    Types: Cigarettes    Last attempt to quit: 05/03/2000    Years since quitting: 16.6  . Smokeless tobacco: Never Used  Substance and Sexual Activity  . Alcohol use: Yes    Alcohol/week: 1.8 oz    Types: 1 Glasses of wine, 1 Cans of beer, 1 Shots of liquor per week    Comment: 1-2/month  . Drug use: No  . Sexual activity: None  Other Topics Concern  . None  Social History Narrative   Married (kids are patients here, wife seen elsewhere). 3 step children, 1 biological. No grandkids.    In late 2016:   86 yo sophomore at [employer]- consider age 12 retirement   Oldest daughter - college of [employer] attorney- social work intl business minor religion      Emergency planning/management officer for [employer]      Hobbies: race sail boats, adrenaline related activities    Objective: BP 120/70 (BP Location: Left Arm, Patient Position: Sitting, Cuff Size: Normal)   Pulse 60   Temp 98 F (36.7 C) (Oral)   Resp (!) 97   Ht 5' 8.5" (1.74 m)   Wt 180 lb 4 oz (81.8 kg)   BMI  27.01 kg/m  Gen: NAD, resting comfortably HEENT: Mucous membranes are moist. Oropharynx normal Neck: no thyromegaly CV: RRR no murmurs rubs or gallops Lungs: CTAB no crackles, wheeze, rhonchi Abdomen: soft/nontender/nondistended/normal bowel sounds. No rebound or guarding. overweight Ext: no edema Skin: warm, dry Neuro: grossly normal, moves all extremities, PERRLA Rectal: normal tone, normal sized prostate- large normal, no masses or tenderness  Assessment/Plan:  66 y.o. male presenting for annual physical.  Health Maintenance counseling: 1. Anticipatory guidance: Patient counseled regarding regular dental exams -q6 months- permanent x2 planned soon, eye exams -yearly, wearing seatbelts.  2. Risk factor reduction:  Advised patient of need for regular exercise and diet rich and fruits and vegetables to reduce risk of heart attack and stroke. Exercise- has struggled over last month- work has been crazy. Diet-down 5 lbs from last year, wants to lose another 5 lbs.  Wt Readings from Last 3 Encounters:  01/03/17 180 lb 4 oz (81.8 kg)  11/28/16 180 lb (81.6 kg)  10/25/16 178 lb 6.4 oz (80.9 kg)  3. Immunizations/screenings/ancillary studies- pneumovax 23 today. flu shot got before [place].  Immunization History  Administered Date(s) Administered  . Influenza Split 11/15/2010, 11/15/2011  . Influenza Whole 10/23/2007, 11/04/2009, 11/04/2012  . Influenza,inj,Quad PF,6+ Mos 10/28/2014  . Influenza-Unspecified 10/05/2013, 10/16/2016  . Pneumococcal Conjugate-13 06/24/2014  . Pneumococcal Polysaccharide-23 07/07/2008, 01/03/2017  . Tdap 11/15/2010  . Zoster 11/15/2011  4. Prostate cancer screening-  has been a long time, will update psa and rectal exam. Hard decision due to false positive potential and other medical problems though.  Lab Results  Component Value Date   PSA 0.95 04/11/2006   5. Colon cancer screening -  due for colonoscopy but we have moved back colonoscopy due to being on  dual blood thinners. He would prefer to do cologuard- makes sense being on antiplatelet 6. Skin cancer screening- advised regular sunscreen use- does well. Denies worrisome, changing, or new skin lesions.   Status of chronic or acute concerns   Hearing loss- sent to ENT/audiology in august- has seen them and he is holding off on aid for now. Spot behind left ear was sebaceous cyst- hda I+D with some relief/smaller now  Has come off probiotics due to some data he read about not being beneficial potentially.   CAD S/P LAD PCI'03, RCA DES 01/16/15 CAD with history stent as recently 2016- asymptomatic- he is continuing cardiology follow up  Type 2 diabetes mellitus with renal manifestations, controlled (HCC) DM with renal manifestations (CKD III)- is supposed to follow with Dr. Everardo AllEllison. He is on insulin. a1c is at least under 8- prefer tighter with CKD but patient is working on it Lab Results  Component Value Date   HGBA1C 7.9 10/25/2016    CKD (chronic kidney disease), stage III CKD III- GFR just below 60 usually- but above at times- update BMP. Continue BP and lipid control. Knows to avoid nsaids  Chronic systolic CHF (congestive heart failure) (HCC) Systolic CHF- after nstemi EF as low as 15%. On ramipril. Updated echo shows 25-30%. Has pacemaker/ICD  History of CVA (cerebrovascular accident) History cva- on eliquis as thought embolic fromlow EF but later moved back to plavix with no evidence of atrial fibrillation. Stroke was 01/2016 (second cva)- residual effects from stroke - none, had some left eye reduced vision which resolved before discharge. No afib has been documented even with regular monitoring  Dyslipidemia HLD- on crestor 40mg  daily. Update lipids  Essential hypertension HTN- controlled on ramipril 5 mg, coreg 6.25mg  BID    Hypothyroidism .Hypothyroidism- is on levothyroxine 75 mcg each day. Update tsh  Return in about 6 months (around 07/03/2017) for follow up- or  sooner if needed.  Orders Placed This Encounter  Procedures  . Pneumococcal polysaccharide vaccine 23-valent greater than or equal to 2yo subcutaneous/IM  . CBC    [place]  . Comprehensive metabolic panel    [place]    Order Specific Question:   Has the patient fasted?    Answer:   No  . Lipid panel    [place]    Order Specific Question:   Has  the patient fasted?    Answer:   No  . PSA  . Hemoglobin A1c    [place]  . TSH      . POCT Urinalysis Dipstick (Automated)   Return precautions advised.  Tana Conch, MD

## 2017-01-06 ENCOUNTER — Other Ambulatory Visit: Payer: Self-pay | Admitting: Family Medicine

## 2017-01-06 DIAGNOSIS — R972 Elevated prostate specific antigen [PSA]: Secondary | ICD-10-CM

## 2017-01-08 ENCOUNTER — Other Ambulatory Visit: Payer: Self-pay | Admitting: Internal Medicine

## 2017-01-13 ENCOUNTER — Ambulatory Visit (INDEPENDENT_AMBULATORY_CARE_PROVIDER_SITE_OTHER): Payer: 59 | Admitting: *Deleted

## 2017-01-13 DIAGNOSIS — I255 Ischemic cardiomyopathy: Secondary | ICD-10-CM

## 2017-01-13 NOTE — Progress Notes (Signed)
Remote ICD transmission.   

## 2017-01-16 ENCOUNTER — Telehealth: Payer: Self-pay

## 2017-01-16 NOTE — Telephone Encounter (Signed)
Called and left a voicemail message asking for a return phone call to complete the requested information from [employer] in order to process patient's Cologuard paperwork. They need the policy holders DOB.

## 2017-01-17 ENCOUNTER — Encounter: Payer: Self-pay | Admitting: Cardiology

## 2017-01-17 ENCOUNTER — Encounter: Payer: 59 | Admitting: Family Medicine

## 2017-01-17 LAB — CUP PACEART REMOTE DEVICE CHECK
Brady Statistic AP VS Percent: 0.3 %
Brady Statistic AS VP Percent: 81.57 %
Brady Statistic RA Percent Paced: 17.1 %
Brady Statistic RV Percent Paced: 7.69 %
Date Time Interrogation Session: 20181210094223
HIGH POWER IMPEDANCE MEASURED VALUE: 68 Ohm
Implantable Lead Implant Date: 20171206
Implantable Lead Location: 753859
Implantable Pulse Generator Implant Date: 20171206
Lead Channel Impedance Value: 180 Ohm
Lead Channel Impedance Value: 184.154
Lead Channel Impedance Value: 190 Ohm
Lead Channel Impedance Value: 194.634
Lead Channel Impedance Value: 323 Ohm
Lead Channel Impedance Value: 380 Ohm
Lead Channel Impedance Value: 399 Ohm
Lead Channel Impedance Value: 437 Ohm
Lead Channel Impedance Value: 437 Ohm
Lead Channel Impedance Value: 627 Ohm
Lead Channel Impedance Value: 627 Ohm
Lead Channel Impedance Value: 646 Ohm
Lead Channel Pacing Threshold Amplitude: 0.875 V
Lead Channel Sensing Intrinsic Amplitude: 13 mV
Lead Channel Setting Pacing Amplitude: 1.5 V
Lead Channel Setting Pacing Amplitude: 2.5 V
Lead Channel Setting Pacing Pulse Width: 0.4 ms
MDC IDC LEAD IMPLANT DT: 20171206
MDC IDC LEAD IMPLANT DT: 20171206
MDC IDC LEAD LOCATION: 753858
MDC IDC LEAD LOCATION: 753860
MDC IDC MSMT BATTERY REMAINING LONGEVITY: 110 mo
MDC IDC MSMT BATTERY VOLTAGE: 3.03 V
MDC IDC MSMT LEADCHNL LV IMPEDANCE VALUE: 180 Ohm
MDC IDC MSMT LEADCHNL LV IMPEDANCE VALUE: 342 Ohm
MDC IDC MSMT LEADCHNL LV IMPEDANCE VALUE: 380 Ohm
MDC IDC MSMT LEADCHNL LV IMPEDANCE VALUE: 627 Ohm
MDC IDC MSMT LEADCHNL LV IMPEDANCE VALUE: 627 Ohm
MDC IDC MSMT LEADCHNL LV PACING THRESHOLD AMPLITUDE: 0.875 V
MDC IDC MSMT LEADCHNL LV PACING THRESHOLD PULSEWIDTH: 0.4 ms
MDC IDC MSMT LEADCHNL RA IMPEDANCE VALUE: 437 Ohm
MDC IDC MSMT LEADCHNL RA PACING THRESHOLD AMPLITUDE: 0.5 V
MDC IDC MSMT LEADCHNL RA PACING THRESHOLD PULSEWIDTH: 0.4 ms
MDC IDC MSMT LEADCHNL RA SENSING INTR AMPL: 3 mV
MDC IDC MSMT LEADCHNL RA SENSING INTR AMPL: 3 mV
MDC IDC MSMT LEADCHNL RV PACING THRESHOLD PULSEWIDTH: 0.4 ms
MDC IDC MSMT LEADCHNL RV SENSING INTR AMPL: 13 mV
MDC IDC SET LEADCHNL LV PACING PULSEWIDTH: 0.4 ms
MDC IDC SET LEADCHNL RA PACING AMPLITUDE: 2 V
MDC IDC SET LEADCHNL RV SENSING SENSITIVITY: 0.3 mV
MDC IDC STAT BRADY AP VP PERCENT: 16.83 %
MDC IDC STAT BRADY AS VS PERCENT: 1.3 %

## 2017-01-20 LAB — COLOGUARD: Cologuard: NEGATIVE

## 2017-01-30 ENCOUNTER — Encounter: Payer: Self-pay | Admitting: Family Medicine

## 2017-02-14 ENCOUNTER — Ambulatory Visit: Payer: 59 | Admitting: Endocrinology

## 2017-02-19 ENCOUNTER — Other Ambulatory Visit: Payer: Self-pay | Admitting: Endocrinology

## 2017-02-20 ENCOUNTER — Other Ambulatory Visit: Payer: Self-pay

## 2017-02-20 MED ORDER — LEVOTHYROXINE SODIUM 75 MCG PO TABS: 75.0000 ug | ORAL_TABLET | Freq: Every day | ORAL | 3 refills | Status: DC

## 2017-03-07 ENCOUNTER — Other Ambulatory Visit: Payer: Self-pay | Admitting: Cardiovascular Disease

## 2017-03-07 DIAGNOSIS — E785 Hyperlipidemia, unspecified: Secondary | ICD-10-CM

## 2017-03-07 DIAGNOSIS — I2510 Atherosclerotic heart disease of native coronary artery without angina pectoris: Secondary | ICD-10-CM

## 2017-03-07 DIAGNOSIS — Z9861 Coronary angioplasty status: Secondary | ICD-10-CM

## 2017-03-28 ENCOUNTER — Ambulatory Visit (INDEPENDENT_AMBULATORY_CARE_PROVIDER_SITE_OTHER): Payer: 59 | Admitting: Endocrinology

## 2017-03-28 ENCOUNTER — Encounter: Payer: Self-pay | Admitting: Endocrinology

## 2017-03-28 ENCOUNTER — Other Ambulatory Visit: Payer: Self-pay

## 2017-03-28 VITALS — BP 130/86 | HR 65 | Wt 182.8 lb

## 2017-03-28 DIAGNOSIS — N183 Chronic kidney disease, stage 3 unspecified: Secondary | ICD-10-CM

## 2017-03-28 DIAGNOSIS — E1122 Type 2 diabetes mellitus with diabetic chronic kidney disease: Secondary | ICD-10-CM

## 2017-03-28 DIAGNOSIS — Z794 Long term (current) use of insulin: Secondary | ICD-10-CM

## 2017-03-28 LAB — POCT GLYCOSYLATED HEMOGLOBIN (HGB A1C): Hemoglobin A1C: 7.8

## 2017-03-28 MED ORDER — BASAGLAR KWIKPEN 100 UNIT/ML ~~LOC~~ SOPN: PEN_INJECTOR | SUBCUTANEOUS | 11 refills | Status: DC

## 2017-03-28 MED ORDER — INSULIN PEN NEEDLE 32G X 4 MM MISC: 11 refills | Status: DC

## 2017-03-28 MED ORDER — INSULIN ASPART 100 UNIT/ML FLEXPEN: 6.0000 [IU] | PEN_INJECTOR | Freq: Three times a day (TID) | SUBCUTANEOUS | 11 refills | Status: DC

## 2017-03-28 NOTE — Progress Notes (Signed)
Subjective:    Patient ID: John BolusStephen A Kusch, male    DOB: Apr 29, 1950, 67 y.o.   MRN: [id number]  HPI Pt returns for f/u of diabetes mellitus:  DM type: 1 Dx'ed: 2008. Complications: CVA, PAD, renal insufficiency, and CAD.   Therapy: insulin since soon after dx.  DKA: never.  Severe hypoglycemia: never.  Pancreatitis: never.   Other: he declines pump, including V-GO; he takes multiple daily injections; He says his ability to care for his DM has been compromised by wife's illness.   Interval history: He seldom has hypoglycemia, and these episodes are mild.  He says cbg is usually highest in the afternoon.   He takes insulins as rx'ed.  He takes novolog 5 units, 3 times a day (just before each meal) Past Medical History:  Diagnosis Date  . Acute kidney injury superimposed on chronic kidney disease (HCC) 01/25/2015  . CORONARY ARTERY DISEASE 08/11/2006  . CVA 12/19/2006   2008  . DIABETES MELLITUS, TYPE I 08/11/2006  . ECZEMA, HANDS 12/15/2009  . HYPERLIPIDEMIA 08/11/2006  . HYPERTENSION 08/11/2006    Past Surgical History:  Procedure Laterality Date  . CARDIAC CATHETERIZATION N/A 01/16/2015   Procedure: Left Heart Cath and Coronary Angiography;  Surgeon: Runell GessJonathan J Berry, MD;  [facility];  Service: Cardiovascular;  Laterality: N/A;  . CORONARY STENT PLACEMENT    . EP IMPLANTABLE DEVICE N/A 01/10/2016   MDT Claria MRI Quad CRTD implanted by Dr Johney FrameAllred  . TEE WITHOUT CARDIOVERSION N/A 01/26/2015   Procedure: TRANSESOPHAGEAL ECHOCARDIOGRAM (TEE);  Surgeon: Thurmon FairMihai Croitoru, MD;  [facility];  Service: Cardiovascular;  Laterality: N/A;  . TONSILECTOMY, ADENOIDECTOMY, BILATERAL MYRINGOTOMY AND TUBES      Social History   Socioeconomic History  . Marital status: Married    Spouse name: Not on file  . Number of children: Not on file  . Years of education: Not on file  . Highest education level: Not on file  Social Needs  . Financial resource strain: Not on  file  . Food insecurity - worry: Not on file  . Food insecurity - inability: Not on file  . Transportation needs - medical: Not on file  . Transportation needs - non-medical: Not on file  Occupational History  . Occupation: Engineer, maintenanceroduction Manager    Employer: [employer]  Tobacco Use  . Smoking status: Former Smoker    Packs/day: 1.00    Years: 30.00    Pack years: 30.00    Types: Cigarettes    Last attempt to quit: 05/03/2000    Years since quitting: 16.9  . Smokeless tobacco: Never Used  Substance and Sexual Activity  . Alcohol use: Yes    Alcohol/week: 1.8 oz    Types: 1 Glasses of wine, 1 Cans of beer, 1 Shots of liquor per week    Comment: 1-2/month  . Drug use: No  . Sexual activity: Not on file  Other Topics Concern  . Not on file  Social History Narrative   Married (kids are patients here, wife seen elsewhere). 3 step children, 1 biological. No grandkids.    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 acetaminophen (TYLENOL) 325 MG tablet Take 2 tablets (650 mg total) by mouth every 4 (four) hours as needed for headache or mild pain.    . carvedilol (COREG) 6.25 MG tablet TAKE 1 TABLET BY MOUTH TWICE DAILY 60 tablet 10  . clopidogrel (PLAVIX) 75 MG tablet Take one (1) tablet (75 mg) by mouth daily. 90 tablet 3  . Coenzyme Q10 60 MG CAPS Take 1 capsule by mouth daily.     [name] glucose blood (ONETOUCH VERIO) test strip 1 each by Other route 2 (two) times daily. And lancets 2/day 200 each 12  . levothyroxine (SYNTHROID,  LEVOTHROID) 75 MCG tablet Take 1 tablet (75 mcg total) by mouth daily before breakfast. 90 tablet 3  . nitroGLYCERIN (NITROSTAT) 0.4 MG SL tablet Place 1 tablet (0.4 mg total) under the tongue every 5 (five) minutes as needed for chest pain. Chest pain 30 tablet 5  . Probiotic Product (PROBIOTIC PO) Take 1 capsule by mouth every morning.    . ramipril (ALTACE) 5 MG capsule Take 1 capsule (5 mg total) by mouth daily. 90 capsule 3  . rosuvastatin (CRESTOR) 40 MG tablet TAKE 1 TABLET BY MOUTH EVERY DAY 90 tablet 2   No current facility-administered medications on file prior to visit.     No Known Allergies  Family History  Problem Relation Age of Onset  . CAD Father   . Other Mother        passed right before 65- tumor on kidney recent diagnosis  . Dementia Mother        progressing rapidly  . CAD Paternal Grandfather        43s  . CAD Paternal Grandmother        82s  . Brain cancer Maternal Grandmother   . Lymphoma Sister        b cell  . Celiac disease Sister   . Diabetes Neg Hx     BP 130/86 (BP Location: Left Arm, Patient Position: Sitting, Cuff Size: Normal)   Pulse 65   Wt 182 lb 12.8 oz (82.9 kg)   SpO2 94%   BMI 27.39 kg/m    Review of Systems Denies LOC    Objective:   Physical Exam VITAL SIGNS:  See vs page GENERAL: no distress Pulses: dorsalis pedis intact bilat.   MSK: no deformity of the feet CV: no leg edema Skin:  no ulcer on the feet.  normal color and temp on the feet. Neuro: sensation is intact to touch on the feet.    A1c=7.8%     Assessment & Plan:  Type 1 DM: he needs increased rx, if it can be done with a regimen that avoids or minimizes hypoglycemia.    Patient Instructions  check your blood sugar 4 times a day--before the 3 meals, and at bedtime.  also check if you have symptoms of your blood sugar being too high or too low.  please keep a record of the readings and bring it to your next appointment here.  please call us sooner if you  are having low blood sugar episodes.   Please come back a follow-up appointment in 4 months.   Please take the insulins as listed below.

## 2017-03-28 NOTE — Patient Instructions (Addendum)
check your blood sugar 4 times a day--before the 3 meals, and at bedtime.  also check if you have symptoms of your blood sugar being too high or too low.  please keep a record of the readings and bring it to your next appointment here.  please call us sooner if you are having low blood sugar episodes.   Please come back a follow-up appointment in 4 months.   Please take the insulins as listed below.

## 2017-04-14 ENCOUNTER — Ambulatory Visit (INDEPENDENT_AMBULATORY_CARE_PROVIDER_SITE_OTHER): Payer: 59 | Admitting: *Deleted

## 2017-04-14 DIAGNOSIS — I255 Ischemic cardiomyopathy: Secondary | ICD-10-CM | POA: Diagnosis not present

## 2017-04-14 NOTE — Progress Notes (Signed)
Remote ICD transmission.   

## 2017-04-16 ENCOUNTER — Encounter: Payer: Self-pay | Admitting: Cardiology

## 2017-04-17 LAB — CUP PACEART REMOTE DEVICE CHECK
Implantable Lead Implant Date: 20171206
Implantable Lead Implant Date: 20171206
Implantable Lead Location: 753858
Implantable Lead Location: 753859
Implantable Lead Location: 753860
Implantable Lead Model: 5076
Implantable Pulse Generator Implant Date: 20171206
Lead Channel Setting Pacing Amplitude: 2 V
Lead Channel Setting Sensing Sensitivity: 0.3 mV
MDC IDC LEAD IMPLANT DT: 20171206
MDC IDC SESS DTM: 20190314143307
MDC IDC SET LEADCHNL LV PACING AMPLITUDE: 1.5 V
MDC IDC SET LEADCHNL LV PACING PULSEWIDTH: 0.4 ms
MDC IDC SET LEADCHNL RV PACING AMPLITUDE: 2.5 V
MDC IDC SET LEADCHNL RV PACING PULSEWIDTH: 0.4 ms

## 2017-07-11 ENCOUNTER — Encounter: Payer: Self-pay | Admitting: Family Medicine

## 2017-07-11 ENCOUNTER — Ambulatory Visit: Payer: 59 | Admitting: Family Medicine

## 2017-07-11 VITALS — BP 128/76 | HR 60 | Temp 97.7°F | Ht 68.5 in | Wt 184.3 lb

## 2017-07-11 DIAGNOSIS — N183 Chronic kidney disease, stage 3 unspecified: Secondary | ICD-10-CM

## 2017-07-11 DIAGNOSIS — I5022 Chronic systolic (congestive) heart failure: Secondary | ICD-10-CM | POA: Diagnosis not present

## 2017-07-11 DIAGNOSIS — E785 Hyperlipidemia, unspecified: Secondary | ICD-10-CM | POA: Diagnosis not present

## 2017-07-11 DIAGNOSIS — J301 Allergic rhinitis due to pollen: Secondary | ICD-10-CM | POA: Diagnosis not present

## 2017-07-11 DIAGNOSIS — R972 Elevated prostate specific antigen [PSA]: Secondary | ICD-10-CM | POA: Diagnosis not present

## 2017-07-11 DIAGNOSIS — J309 Allergic rhinitis, unspecified: Secondary | ICD-10-CM | POA: Insufficient documentation

## 2017-07-11 DIAGNOSIS — I10 Essential (primary) hypertension: Secondary | ICD-10-CM | POA: Diagnosis not present

## 2017-07-11 DIAGNOSIS — E039 Hypothyroidism, unspecified: Secondary | ICD-10-CM

## 2017-07-11 LAB — BASIC METABOLIC PANEL
BUN: 18 mg/dL (ref 6–23)
CALCIUM: 9.2 mg/dL (ref 8.4–10.5)
CHLORIDE: 103 meq/L (ref 96–112)
CO2: 30 meq/L (ref 19–32)
Creatinine, Ser: 1.2 mg/dL (ref 0.40–1.50)
GFR: 64.41 mL/min (ref >60.00)
Glucose, Bld: 109 mg/dL — ABNORMAL HIGH (ref 70–99)
Potassium: 4.6 mEq/L (ref 3.5–5.1)
SODIUM: 141 meq/L (ref 135–145)

## 2017-07-11 LAB — CBC
HEMATOCRIT: 44 % (ref 39.0–52.0)
HEMOGLOBIN: 15.1 g/dL (ref 13.0–17.0)
MCHC: 34.4 g/dL (ref 30.0–36.0)
MCV: 97.6 fl (ref 78.0–100.0)
PLATELETS: 204 10*3/uL (ref 150.0–400.0)
RBC: 4.51 Mil/uL (ref 4.22–5.81)
RDW: 13 % (ref 11.5–15.5)
WBC: 8.5 10*3/uL (ref 4.0–10.5)

## 2017-07-11 LAB — TSH: TSH: 3.31 u[IU]/mL (ref 0.35–4.50)

## 2017-07-11 LAB — PSA: PSA: 5.19 ng/mL — ABNORMAL HIGH (ref 0.10–4.00)

## 2017-07-11 LAB — LDL CHOLESTEROL, DIRECT: Direct LDL: 85 mg/dL

## 2017-07-11 MED ORDER — NITROGLYCERIN 0.4 MG SL SUBL: 0.4000 mg | SUBLINGUAL_TABLET | SUBLINGUAL | 5 refills | Status: DC | PRN

## 2017-07-11 MED ORDER — FLUTICASONE PROPIONATE 50 MCG/ACT NA SUSP: 2.0000 | Freq: Every day | NASAL | 3 refills | Status: DC

## 2017-07-11 NOTE — Patient Instructions (Signed)
Please stop by lab before you go  No changes today-other than trying Flonase to see if this helps you with your allergies  See you in 6 months for physical

## 2017-07-11 NOTE — Assessment & Plan Note (Signed)
S: bad allergy season.  With watery eyes, sinus pressure.  Also some runny nose.  Does not like systemic side effects from antihistamines A/P: We will trial Flonase

## 2017-07-11 NOTE — Assessment & Plan Note (Signed)
S: Patient with systolic congestive heart failure after NSTEMI.  His ejection fraction got as low as 15% but most recent echo is up to 30% in 12/2016.  He has a pacemaker and ICD.  He is on ramipril and carvedilol A/P: Continue current medications. He is going to call to schedule follow up with cardiology

## 2017-07-11 NOTE — Assessment & Plan Note (Signed)
S: Poorly controlled at last visit with LDL 98 on Crestor 40 mg- prior had been 66 Lab Results  Component Value Date   CHOL 179 01/03/2017   HDL 57.20 01/03/2017   LDLCALC 98 01/03/2017   LDLDIRECT 90.0 02/17/2015   TRIG 121.0 01/03/2017   CHOLHDL 3 01/03/2017   A/P: update direct LDL today- hopefully under 70

## 2017-07-11 NOTE — Assessment & Plan Note (Signed)
S: Patient remains on levothyroxine 75 mcg A/P: Update TSH with labs

## 2017-07-11 NOTE — Assessment & Plan Note (Signed)
S: CKD stage III.  He knows to avoid NSAIDs.  His blood pressure and lipids are controlled A/P: Update BMP today

## 2017-07-11 NOTE — Progress Notes (Signed)
Your PSA has trended up once again- team lets refer him to urology under elevated PSA- tell him likely will take several months to get him in.  Your CBC was normal (blood counts, infection fighting cells, platelets). Your cholesterol is still above goal. It would be reasonable to add zetia 10mg  to help reduce your risks long term in relation to your heart disease. You can also discuss this with cardiology if you prefer.  Your BMET was normal (kidney, electrolytes, blood sugar) for someone with diabetes.  Your thyroid was normal.

## 2017-07-11 NOTE — Assessment & Plan Note (Signed)
S: controlled on ramipril 5 mg and Coreg 6.25 mg twice daily BP Readings from Last 3 Encounters:  07/11/17 128/76  03/28/17 130/86  01/03/17 120/70  A/P: We discussed blood pressure goal of <140/90. Continue current meds

## 2017-07-11 NOTE — Progress Notes (Signed)
Subjective:  John John Carrillo A John Carrillo is a 67 y.o. year old very pleasant male patient who presents for/with See problem oriented charting ROS-admits to sinus pressure, no chest pain or shortness of breath.  No edema.  Past Medical History-  Patient Active Problem List   Diagnosis Date Noted  . Chronic systolic CHF (congestive heart failure) (HCC) 01/25/2015    Priority: High  . Cardiomyopathy, ischemic 01/17/2015    Priority: High  . Type 2 diabetes mellitus with renal manifestations, controlled (HCC) 06/14/2011    Priority: High  . History of CVA (cerebrovascular accident) 12/19/2006    Priority: High  . CAD S/P LAD PCI'03, RCA DES 01/16/15 08/11/2006    Priority: High  . Hypothyroidism 02/17/2015    Priority: Medium  . CKD (chronic kidney disease), stage III (HCC) 01/17/2015    Priority: Medium  . Dyslipidemia 08/11/2006    Priority: Medium  . Essential hypertension 08/11/2006    Priority: Medium  . Allergic rhinitis 07/11/2017    Priority: Low  . LBBB (left bundle branch block) 01/17/2015    Priority: Low  . SVT (supraventricular tachycardia) (HCC) 01/14/2015    Priority: Low  . Former smoker 06/24/2014    Priority: Low  . Eczema of both hands 12/15/2009    Priority: Low  . Epidermoid cyst of ear 10/22/2016  . Headache 02/16/2016  . Chronic systolic dysfunction of left ventricle 01/10/2016  . Occipital infarction (HCC) 04/04/2015    Medications- reviewed and updated Current Outpatient Medications  Medication Sig Dispense Refill  . acetaminophen (TYLENOL) 325 MG tablet Take 2 tablets (650 mg total) by mouth every 4 (four) hours as needed for headache or mild pain.    . carvedilol (COREG) 6.25 MG tablet TAKE 1 TABLET BY MOUTH TWICE DAILY 60 tablet 10  . clopidogrel (PLAVIX) 75 MG tablet Take one (1) tablet (75 mg) by mouth daily. 90 tablet 3  . Coenzyme Q10 60 MG CAPS Take 1 capsule by mouth daily.     . fluticasone (FLONASE) 50 MCG/ACT nasal spray Place 2 sprays into both  nostrils daily. 16 g 3  . glucose blood (ONETOUCH VERIO) test strip 1 each by Other route 2 (two) times daily. And lancets 2/day 200 each 12  . insulin aspart (NOVOLOG FLEXPEN) 100 UNIT/ML FlexPen Inject 6 Units into the skin 3 (three) times daily with meals. And pen needles 5/day 15 mL 11  . Insulin Glargine (BASAGLAR KWIKPEN) 100 UNIT/ML SOPN 16 units each morning, and 18 units each evening. 5 pen 11  . Insulin Pen Needle 32G X 4 MM MISC Used to inject insulin into skin daily. 100 each 11  . levothyroxine (SYNTHROID, LEVOTHROID) 75 MCG tablet Take 1 tablet (75 mcg total) by mouth daily before breakfast. 90 tablet 3  . nitroGLYCERIN (NITROSTAT) 0.4 MG SL tablet Place 1 tablet (0.4 mg total) under the tongue every 5 (five) minutes as needed for chest pain. Chest pain 30 tablet 5  . Probiotic Product (PROBIOTIC PO) Take 1 capsule by mouth every morning.    . ramipril (ALTACE) 5 MG capsule Take 1 capsule (5 mg total) by mouth daily. 90 capsule 3  . rosuvastatin (CRESTOR) 40 MG tablet TAKE 1 TABLET BY MOUTH EVERY DAY 90 tablet 2   No current facility-administered medications for this visit.     Objective: BP 128/76 (BP Location: Left Arm, Patient Position: Sitting, Cuff Size: Normal)   Pulse 60   Temp 97.7 F (36.5 C) (Oral)   Ht 5' 8.5" (1.74  m)   Wt 184 lb 4.8 oz (83.6 kg)   SpO2 96%   BMI 27.62 kg/m  Gen: NAD, resting comfortably CV: RRR no murmurs rubs or gallops Lungs: CTAB no crackles, wheeze, rhonchi Abdomen: soft/nontender/nondistended/normal bowel sounds. No rebound or guarding.  Ext: no edema Skin: warm, dry  Assessment/Plan:  Other notes: 1.Slight MCV elevation last visit-update CBC 2.  Patient with elevated PSA at last visit-was supposed to come back in 1 month. We discussed could possibly be BPH but if still over 4 would want urology's opinion Lab Results  Component Value Date   PSA 4.31 (H) 01/03/2017   PSA 0.95 04/11/2006  3.  Diabetes followed by Dr. Everardo Carrillo and  has visit in August  Hypothyroidism S: Patient remains on levothyroxine 75 mcg A/P: Update TSH with labs    Allergic rhinitis S: bad allergy season.  With watery eyes, sinus pressure.  Also some runny nose.  Does not like systemic side effects from antihistamines A/P: We will trial Flonase  CKD (chronic kidney disease), stage III S: CKD stage III.  He knows to avoid NSAIDs.  His blood pressure and lipids are controlled A/P: Update BMP today  Chronic systolic CHF (congestive heart failure) (HCC) S: Patient with systolic congestive heart failure after NSTEMI.  His ejection fraction got as low as 15% but most recent echo is up to 30% in 12/2016.  He has a pacemaker and ICD.  He is on ramipril and carvedilol A/P: Continue current medications. He is going to call to schedule follow up with cardiology  Dyslipidemia S: Poorly controlled at last visit with LDL 98 on Crestor 40 mg- prior had been 66 Lab Results  Component Value Date   CHOL 179 01/03/2017   HDL 57.20 01/03/2017   LDLCALC 98 01/03/2017   LDLDIRECT 90.0 02/17/2015   TRIG 121.0 01/03/2017   CHOLHDL 3 01/03/2017   A/P: update direct LDL today- hopefully under 70  Essential hypertension S: controlled on ramipril 5 mg and Coreg 6.25 mg twice daily BP Readings from Last 3 Encounters:  07/11/17 128/76  03/28/17 130/86  01/03/17 120/70  A/P: We discussed blood pressure goal of <140/90. Continue current meds   Future Appointments  Date Time Provider Department Center  07/14/2017 10:50 AM [facility] DEVICE REMOTES [facility]  09/26/2017  7:45 AM John Belling, MD [facility] None   Return in about 6 months (around 01/10/2018) for physical.  Lab/Order associations: Hypertension, unspecified type - Plan: CBC, Basic metabolic panel  Elevated PSA - Plan: PSA  Hypothyroidism, unspecified type - Plan: TSH  Dyslipidemia - Plan: LDL cholesterol, direct  Meds ordered this encounter  Medications  . fluticasone  (FLONASE) 50 MCG/ACT nasal spray    Sig: Place 2 sprays into both nostrils daily.    Dispense:  16 g    Refill:  3  . nitroGLYCERIN (NITROSTAT) 0.4 MG SL tablet    Sig: Place 1 tablet (0.4 mg total) under the tongue every 5 (five) minutes as needed for chest pain. Chest pain    Dispense:  30 tablet    Refill:  5   Return precautions advised.  Tana Conch, MD

## 2017-07-14 ENCOUNTER — Other Ambulatory Visit: Payer: Self-pay

## 2017-07-14 ENCOUNTER — Ambulatory Visit (INDEPENDENT_AMBULATORY_CARE_PROVIDER_SITE_OTHER): Payer: 59 | Admitting: *Deleted

## 2017-07-14 DIAGNOSIS — I255 Ischemic cardiomyopathy: Secondary | ICD-10-CM | POA: Diagnosis not present

## 2017-07-14 DIAGNOSIS — R972 Elevated prostate specific antigen [PSA]: Secondary | ICD-10-CM

## 2017-07-14 MED ORDER — EZETIMIBE 10 MG PO TABS: 10.0000 mg | ORAL_TABLET | Freq: Every day | ORAL | 3 refills | Status: DC

## 2017-07-14 NOTE — Progress Notes (Signed)
Remote ICD transmission.   

## 2017-07-15 ENCOUNTER — Encounter: Payer: Self-pay | Admitting: Cardiology

## 2017-07-15 LAB — CUP PACEART REMOTE DEVICE CHECK
Brady Statistic AP VS Percent: 0.43 %
Brady Statistic AS VP Percent: 74.68 %
Brady Statistic AS VS Percent: 1.17 %
Brady Statistic RA Percent Paced: 24.1 %
Date Time Interrogation Session: 20190610084223
HIGH POWER IMPEDANCE MEASURED VALUE: 65 Ohm
Implantable Lead Implant Date: 20171206
Implantable Lead Location: 753860
Implantable Lead Model: 4598
Implantable Lead Model: 5076
Implantable Pulse Generator Implant Date: 20171206
Lead Channel Impedance Value: 194.634
Lead Channel Impedance Value: 203.256
Lead Channel Impedance Value: 323 Ohm
Lead Channel Impedance Value: 399 Ohm
Lead Channel Impedance Value: 437 Ohm
Lead Channel Impedance Value: 437 Ohm
Lead Channel Impedance Value: 437 Ohm
Lead Channel Impedance Value: 456 Ohm
Lead Channel Impedance Value: 665 Ohm
Lead Channel Impedance Value: 665 Ohm
Lead Channel Pacing Threshold Amplitude: 0.75 V
Lead Channel Pacing Threshold Amplitude: 0.875 V
Lead Channel Pacing Threshold Pulse Width: 0.4 ms
Lead Channel Sensing Intrinsic Amplitude: 18.875 mV
Lead Channel Sensing Intrinsic Amplitude: 18.875 mV
Lead Channel Sensing Intrinsic Amplitude: 2.75 mV
Lead Channel Setting Pacing Amplitude: 1.5 V
Lead Channel Setting Pacing Amplitude: 2.5 V
Lead Channel Setting Pacing Pulse Width: 0.4 ms
Lead Channel Setting Pacing Pulse Width: 0.4 ms
MDC IDC LEAD IMPLANT DT: 20171206
MDC IDC LEAD IMPLANT DT: 20171206
MDC IDC LEAD LOCATION: 753858
MDC IDC LEAD LOCATION: 753859
MDC IDC MSMT BATTERY REMAINING LONGEVITY: 105 mo
MDC IDC MSMT BATTERY VOLTAGE: 3.01 V
MDC IDC MSMT LEADCHNL LV IMPEDANCE VALUE: 207.273
MDC IDC MSMT LEADCHNL LV IMPEDANCE VALUE: 212.8 Ohm
MDC IDC MSMT LEADCHNL LV IMPEDANCE VALUE: 223.149
MDC IDC MSMT LEADCHNL LV IMPEDANCE VALUE: 380 Ohm
MDC IDC MSMT LEADCHNL LV IMPEDANCE VALUE: 703 Ohm
MDC IDC MSMT LEADCHNL LV IMPEDANCE VALUE: 722 Ohm
MDC IDC MSMT LEADCHNL LV IMPEDANCE VALUE: 722 Ohm
MDC IDC MSMT LEADCHNL RA IMPEDANCE VALUE: 437 Ohm
MDC IDC MSMT LEADCHNL RA PACING THRESHOLD AMPLITUDE: 0.375 V
MDC IDC MSMT LEADCHNL RA PACING THRESHOLD PULSEWIDTH: 0.4 ms
MDC IDC MSMT LEADCHNL RA SENSING INTR AMPL: 2.75 mV
MDC IDC MSMT LEADCHNL RV PACING THRESHOLD PULSEWIDTH: 0.4 ms
MDC IDC SET LEADCHNL RA PACING AMPLITUDE: 2 V
MDC IDC SET LEADCHNL RV SENSING SENSITIVITY: 0.3 mV
MDC IDC STAT BRADY AP VP PERCENT: 23.72 %
MDC IDC STAT BRADY RV PERCENT PACED: 13.56 %

## 2017-07-17 IMAGING — CT CT HEAD W/O CM
1 series · 16 of 30 positions shown, 20 images · non-contrast
Comparison: 12/13/2006

CLINICAL DATA: Left visual loss with left-sided headaches

EXAM:
CT HEAD WITHOUT CONTRAST
TECHNIQUE: Contiguous axial images were obtained from the base of the skull
through the vertex without intravenous contrast.

[Series 2: head 5.0 h30s · axial · 0.42mm/px · z∈[-32,+103]mm · 16 of 30 slices shown, 20 images]
[im 2/30  brain]
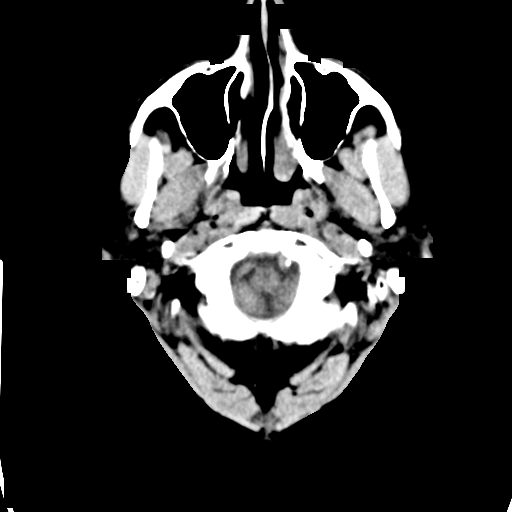
[im 2/30  bone]
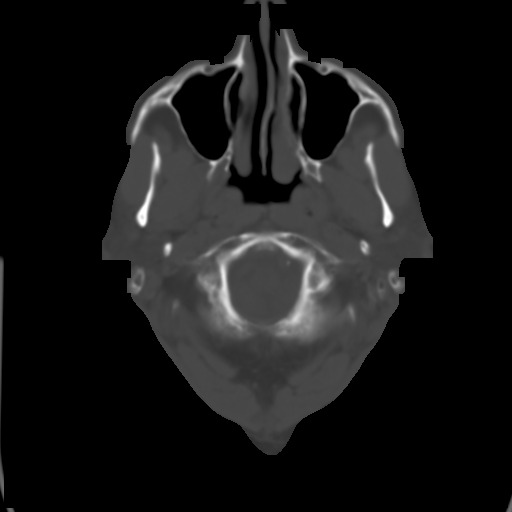
[im 4/30  brain]
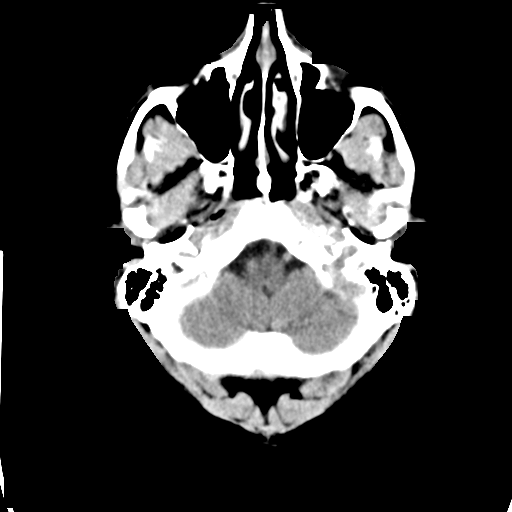
[im 6/30  brain]
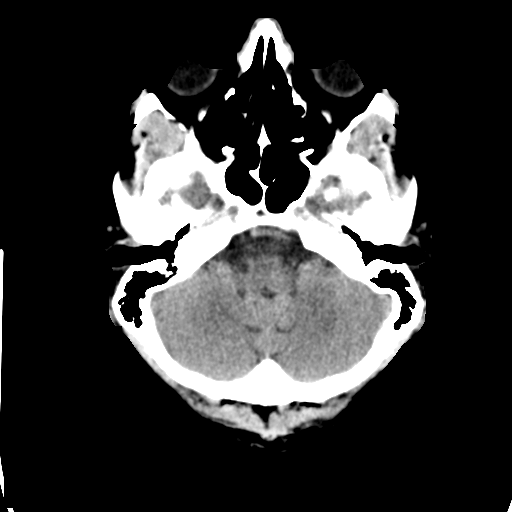
[im 8/30  brain]
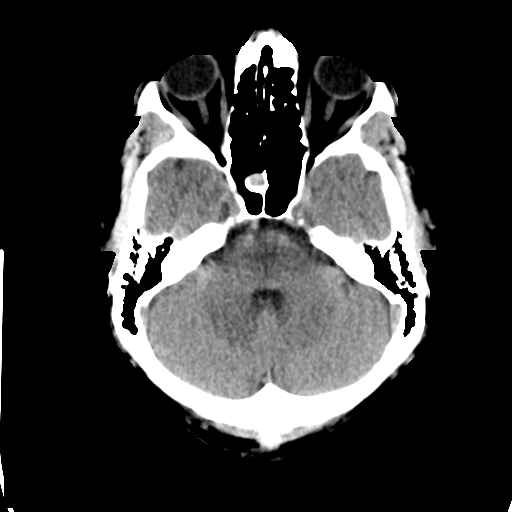
[im 9/30  brain]
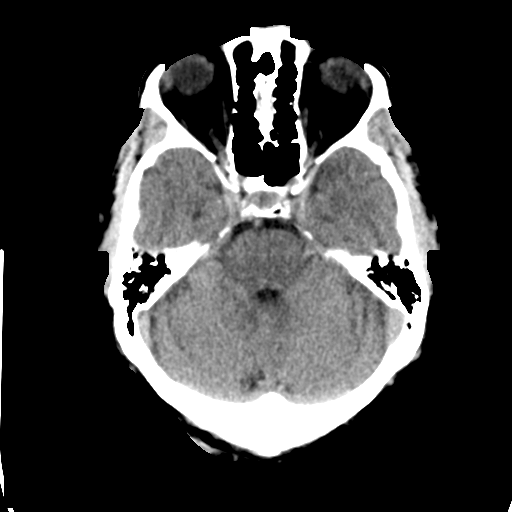
[im 9/30  bone]
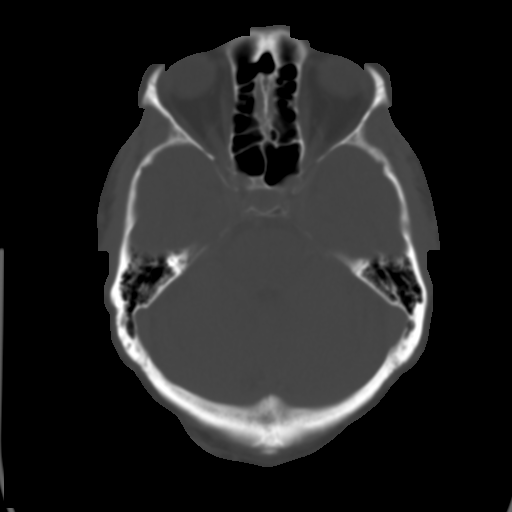
[im 11/30  brain]
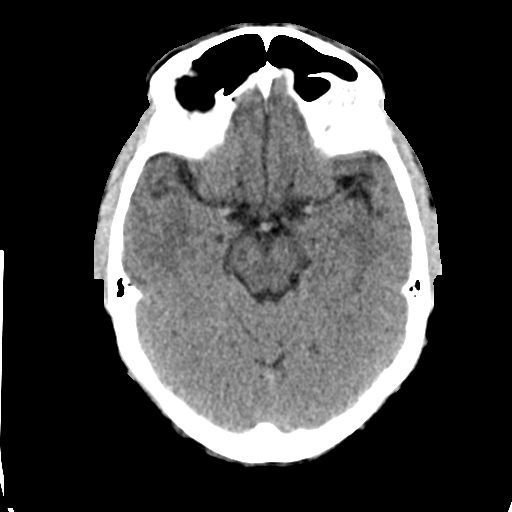
[im 13/30  brain]
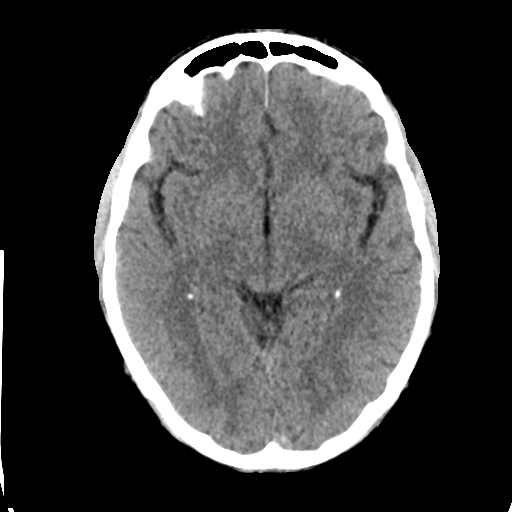
[im 15/30  brain]
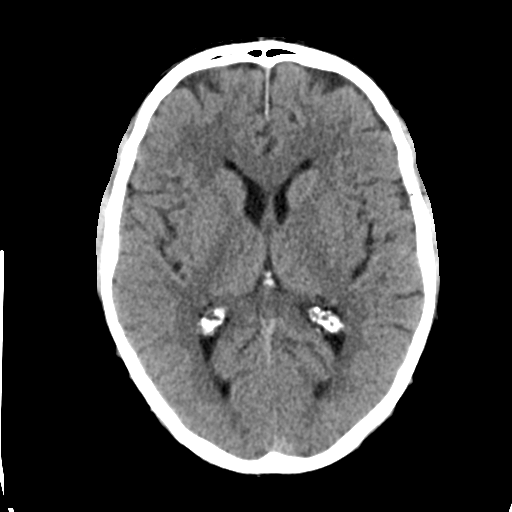
[im 16/30  brain]
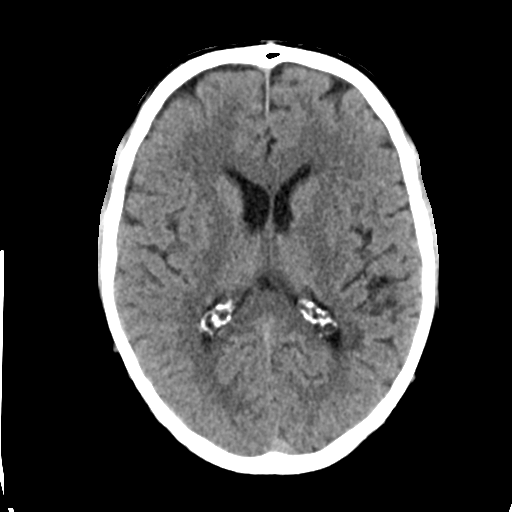
[im 16/30  bone]
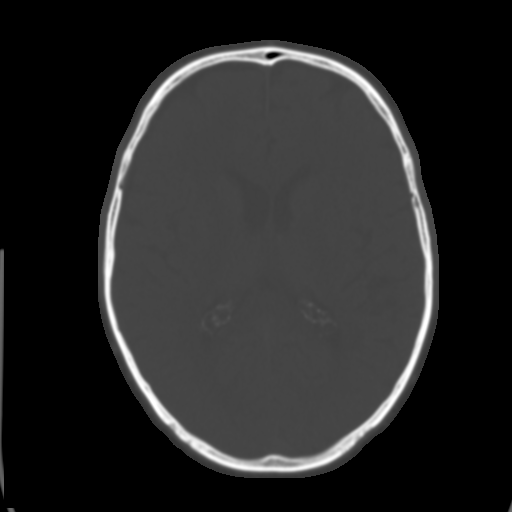
[im 18/30  brain]
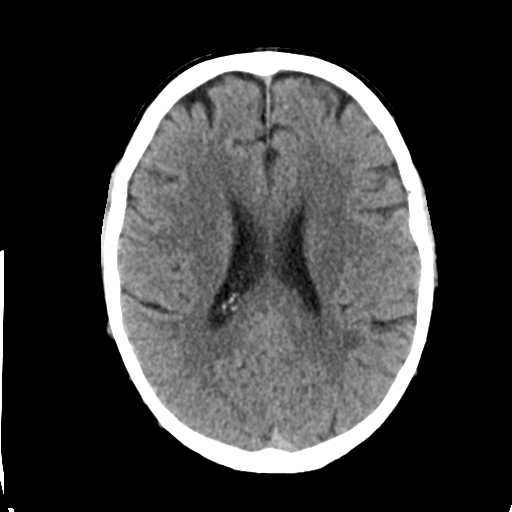
[im 20/30  brain]
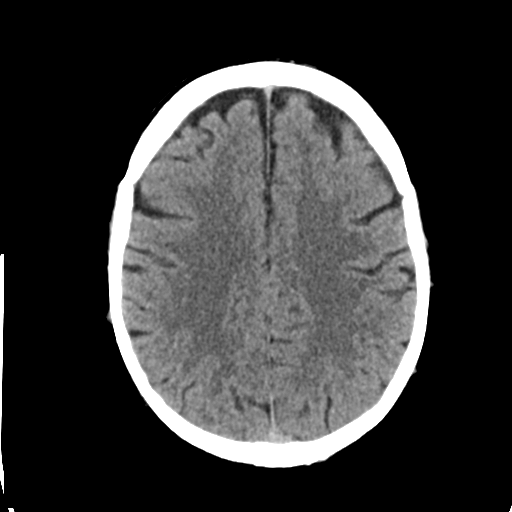
[im 22/30  brain]
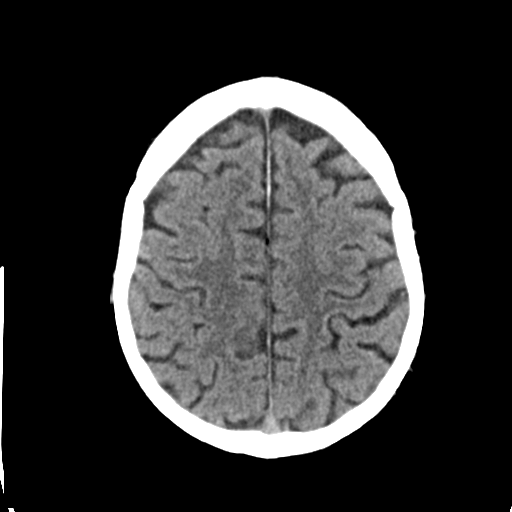
[im 23/30  brain]
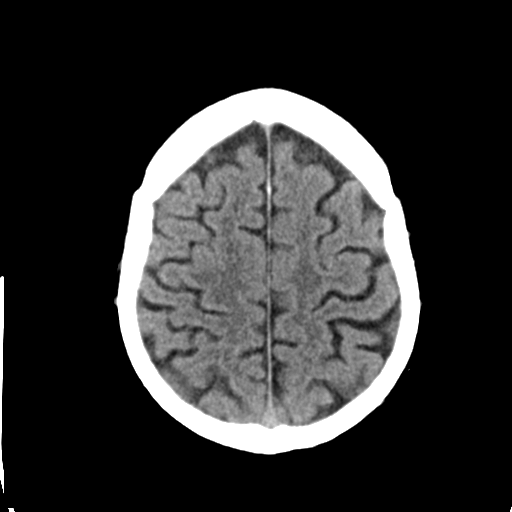
[im 23/30  bone]
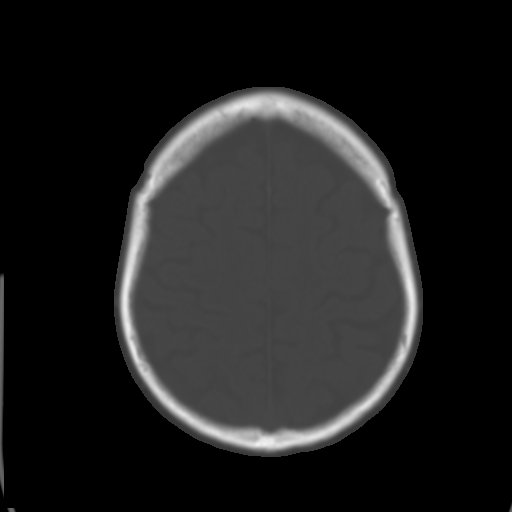
[im 25/30  brain]
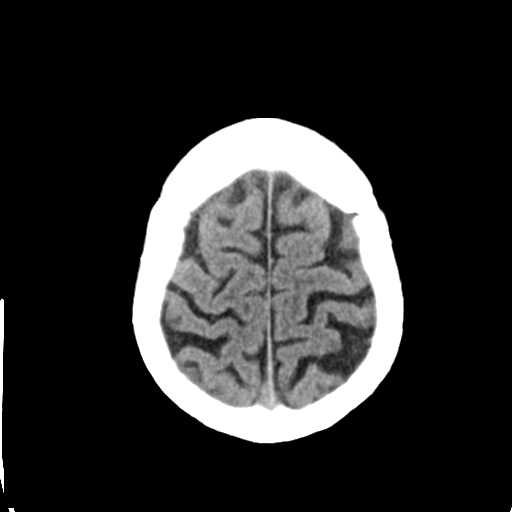
[im 27/30  brain]
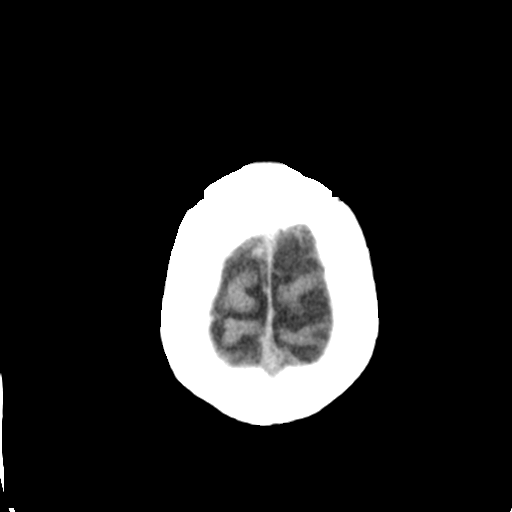
[im 29/30  brain]
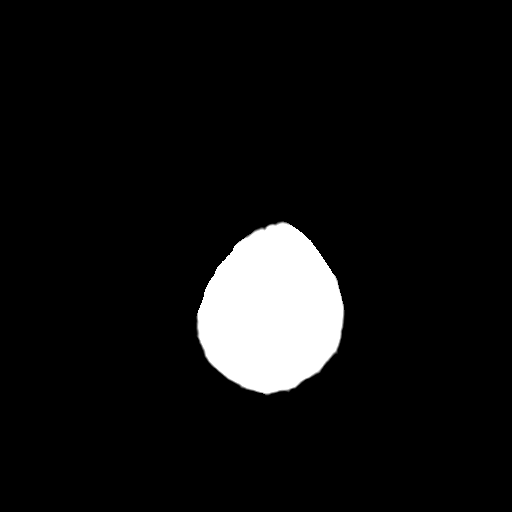

[16 of 30 positions shown; findings below may reference images not displayed]

FINDINGS: Bony calvarium is intact. Changes of prior ischemia are noted in the
left parietal lobe posteriorly. No acute areas of ischemia are
noted. No hemorrhage or space-occupying mass lesion is seen.
IMPRESSION: Chronic changes without acute abnormality.

## 2017-08-15 ENCOUNTER — Ambulatory Visit (HOSPITAL_COMMUNITY): Payer: 59 | Attending: Cardiology

## 2017-08-15 ENCOUNTER — Other Ambulatory Visit: Payer: Self-pay

## 2017-08-15 ENCOUNTER — Encounter (INDEPENDENT_AMBULATORY_CARE_PROVIDER_SITE_OTHER): Payer: Self-pay

## 2017-08-15 DIAGNOSIS — R9439 Abnormal result of other cardiovascular function study: Secondary | ICD-10-CM | POA: Insufficient documentation

## 2017-08-15 DIAGNOSIS — E1122 Type 2 diabetes mellitus with diabetic chronic kidney disease: Secondary | ICD-10-CM | POA: Insufficient documentation

## 2017-08-15 DIAGNOSIS — I5022 Chronic systolic (congestive) heart failure: Secondary | ICD-10-CM | POA: Insufficient documentation

## 2017-08-15 DIAGNOSIS — Z87891 Personal history of nicotine dependence: Secondary | ICD-10-CM | POA: Insufficient documentation

## 2017-08-15 DIAGNOSIS — E785 Hyperlipidemia, unspecified: Secondary | ICD-10-CM | POA: Diagnosis not present

## 2017-08-15 DIAGNOSIS — I130 Hypertensive heart and chronic kidney disease with heart failure and stage 1 through stage 4 chronic kidney disease, or unspecified chronic kidney disease: Secondary | ICD-10-CM | POA: Insufficient documentation

## 2017-08-15 DIAGNOSIS — N189 Chronic kidney disease, unspecified: Secondary | ICD-10-CM | POA: Diagnosis not present

## 2017-08-15 DIAGNOSIS — Z8249 Family history of ischemic heart disease and other diseases of the circulatory system: Secondary | ICD-10-CM | POA: Insufficient documentation

## 2017-08-15 DIAGNOSIS — I2510 Atherosclerotic heart disease of native coronary artery without angina pectoris: Secondary | ICD-10-CM | POA: Diagnosis not present

## 2017-08-15 DIAGNOSIS — Z8673 Personal history of transient ischemic attack (TIA), and cerebral infarction without residual deficits: Secondary | ICD-10-CM | POA: Diagnosis not present

## 2017-08-15 NOTE — Progress Notes (Signed)
Yearly echo.  Order entered.

## 2017-09-03 ENCOUNTER — Encounter: Payer: 59 | Admitting: Nurse Practitioner

## 2017-09-12 ENCOUNTER — Encounter: Payer: Self-pay | Admitting: Nurse Practitioner

## 2017-09-26 ENCOUNTER — Ambulatory Visit (INDEPENDENT_AMBULATORY_CARE_PROVIDER_SITE_OTHER): Payer: 59 | Admitting: Endocrinology

## 2017-09-26 ENCOUNTER — Encounter: Payer: Self-pay | Admitting: Endocrinology

## 2017-09-26 VITALS — BP 122/70 | HR 64 | Ht 68.5 in | Wt 182.8 lb

## 2017-09-26 DIAGNOSIS — Z794 Long term (current) use of insulin: Secondary | ICD-10-CM

## 2017-09-26 DIAGNOSIS — N183 Chronic kidney disease, stage 3 unspecified: Secondary | ICD-10-CM

## 2017-09-26 DIAGNOSIS — E1122 Type 2 diabetes mellitus with diabetic chronic kidney disease: Secondary | ICD-10-CM

## 2017-09-26 LAB — POCT GLYCOSYLATED HEMOGLOBIN (HGB A1C): HEMOGLOBIN A1C: 8 % — AB (ref 4.0–5.6)

## 2017-09-26 MED ORDER — BASAGLAR KWIKPEN 100 UNIT/ML ~~LOC~~ SOPN: 18.0000 [IU] | PEN_INJECTOR | Freq: Two times a day (BID) | SUBCUTANEOUS | 11 refills | Status: DC

## 2017-09-26 MED ORDER — INSULIN ASPART 100 UNIT/ML FLEXPEN: PEN_INJECTOR | SUBCUTANEOUS | 11 refills | Status: DC

## 2017-09-26 NOTE — Patient Instructions (Addendum)
check your blood sugar 4 times a day--before the 3 meals, and at bedtime.  also check if you have symptoms of your blood sugar being too high or too low.  please keep a record of the readings and bring it to your next appointment here.  please call us sooner if you are having low blood sugar episodes.   Please come back a follow-up appointment in 3 months.   Please take the insulin as listed below.

## 2017-09-26 NOTE — Progress Notes (Signed)
Subjective:    Patient ID: John Carrillo, male    DOB: [birth year], 67 y.o.   MRN: [id number]  HPI Pt returns for f/u of diabetes mellitus:  DM type: 1 Dx'ed: 2008. Complications: CVA, PAD, renal insufficiency, and CAD.   Therapy: insulin since soon after dx.  DKA: never.  Severe hypoglycemia: never.  Pancreatitis: never.   Other: he declines pump, including V-GO; he takes multiple daily injections; He says his ability to care for his DM has been compromised by wife's illness.   Interval history:   He says cbg is usually highest fasting (higher than at HS).  He takes insulins as rx'ed.  pt states he feels well in general.  no cbg record, but he has mild hypoglycemia at lunch.   Past Medical History:  Diagnosis Date  . Acute kidney injury superimposed on chronic kidney disease (HCC) 01/25/2015  . CORONARY ARTERY DISEASE 08/11/2006  . CVA 12/19/2006   2008  . DIABETES MELLITUS, TYPE I 08/11/2006  . ECZEMA, HANDS 12/15/2009  . HYPERLIPIDEMIA 08/11/2006  . HYPERTENSION 08/11/2006    Past Surgical History:  Procedure Laterality Date  . CARDIAC CATHETERIZATION N/A 01/16/2015   Procedure: Left Heart Cath and Coronary Angiography;  Surgeon: [name], MD;  Location: [facility] INVASIVE CV LAB;  Service: Cardiovascular;  Laterality: N/A;  . CORONARY STENT PLACEMENT    . EP IMPLANTABLE DEVICE N/A 01/10/2016   MDT Claria MRI Quad CRTD implanted by Dr [name]  . TEE WITHOUT CARDIOVERSION N/A 01/26/2015   Procedure: TRANSESOPHAGEAL ECHOCARDIOGRAM (TEE);  Surgeon: [name], MD;  Location: [facility] ENDOSCOPY;  Service: Cardiovascular;  Laterality: N/A;  . TONSILECTOMY, ADENOIDECTOMY, BILATERAL MYRINGOTOMY AND TUBES      Social History   Socioeconomic History  . Marital status: Married    Spouse name: Not on file  . Number of children: Not on file  . Years of education: Not on file  . Highest education level: Not on file  Occupational History  . Occupation: Engineer, maintenance:  [employer]  Social Needs  . Financial resource strain: Not on file  . Food insecurity:    Worry: Not on file    Inability: Not on file  . Transportation needs:    Medical: Not on file    Non-medical: Not on file  Tobacco Use  . Smoking status: Former Smoker    Packs/day: 1.00    Years: 30.00    Pack years: 30.00    Types: Cigarettes    Last attempt to quit: 05/03/2000    Years since quitting: 17.4  . Smokeless tobacco: Never Used  Substance and Sexual Activity  . Alcohol use: Yes    Alcohol/week: 3.0 standard drinks    Types: 1 Glasses of wine, 1 Cans of beer, 1 Shots of liquor per week    Comment: 1-2/month  . Drug use: No  . Sexual activity: Not on file  Lifestyle  . Physical activity:    Days per week: Not on file    Minutes per session: Not on file  . Stress: Not on file  Relationships  . Social connections:    Talks on phone: Not on file    Gets together: Not on file    Attends religious service: Not on file    Active member of club or organization: Not on file    Attends meetings of clubs or organizations: Not on file    Relationship status: Not on file  . Intimate partner  violence:    Fear of current or ex partner: Not on file    Emotionally abused: Not on file    Physically abused: Not on file    Forced sexual activity: Not on file  Other Topics Concern  . Not on file  Social History Narrative   Married (kids are patients here, wife seen elsewhere). 3 step children, 1 biological. No grandkids.    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 acetaminophen (TYLENOL) 325 MG tablet Take 2 tablets (650 mg total) by mouth every 4 (four) hours as needed for headache or mild pain.    . carvedilol (COREG) 6.25 MG tablet TAKE 1 TABLET BY MOUTH TWICE DAILY 60 tablet 10  . clopidogrel (PLAVIX) 75 MG tablet Take one (1) tablet (75 mg) by mouth daily. 90 tablet 3  . Coenzyme Q10 60 MG CAPS Take 1 capsule by mouth daily.     [name] ezetimibe (ZETIA) 10 MG tablet Take 1 tablet (10 mg total) by mouth daily. 90 tablet 3  . fluticasone (FLONASE) 50 MCG/ACT nasal spray Place 2 sprays into both nostrils daily. 16 g 3  . glucose blood (ONETOUCH VERIO) test strip 1 each by Other route 2 (two) times daily. And lancets 2/day 200 each 12  . Insulin Pen Needle 32G X 4 MM MISC Used to inject insulin into skin daily. 100 each 11  . levothyroxine (SYNTHROID, LEVOTHROID) 75 MCG tablet Take 1 tablet (75 mcg total) by mouth daily before breakfast. 90 tablet 3  . nitroGLYCERIN (NITROSTAT) 0.4 MG SL tablet Place 1 tablet (0.4 mg total) under the tongue every 5 (five) minutes as needed for chest pain. Chest pain 30 tablet 5  . Probiotic Product (PROBIOTIC PO) Take 1 capsule by mouth every morning.    . ramipril (ALTACE) 5 MG capsule Take 1 capsule (5 mg total) by mouth daily. 90 capsule 3  . rosuvastatin (CRESTOR) 40 MG tablet TAKE 1 TABLET BY MOUTH EVERY DAY 90 tablet 2   No current facility-administered medications on file prior to visit.     No Known Allergies  Family History  Problem Relation Age of Onset  . CAD Father   . Other Mother        passed right before 36- tumor on kidney recent diagnosis  . Dementia Mother        progressing rapidly  . CAD Paternal Grandfather        66s  . CAD Paternal Grandmother        50s  . Brain cancer Maternal Grandmother   . Lymphoma Sister        b cell  . Celiac disease Sister   . Diabetes Neg Hx     BP 122/70 (BP Location: Left Arm, Patient Position: Sitting, Cuff  Size: Normal)   Pulse 64   Ht 5' 8.5" (1.74 m)   Wt 182 lb 12.8 oz (82.9 kg)   SpO2 96%   BMI 27.39 kg/m  Review of Systems He denies LOC    Objective:   Physical Exam VITAL SIGNS:  See vs page GENERAL: no distress Pulses: foot pulses are intact bilaterally.   MSK: no deformity of the feet or ankles.  CV: no edema of the legs or ankles Skin:  no ulcer on the feet or ankles.  normal color and temp on the feet and ankles.   Neuro: sensation is intact to touch on the feet and ankles.     Lab Results  Component Value Date   HGBA1C 8.0 (A) 09/26/2017   Lab Results  Component Value Date   CREATININE 1.20 07/11/2017   BUN 18 07/11/2017   NA 141 07/11/2017   K 4.6 07/11/2017   CL 103 07/11/2017   CO2 30 07/11/2017       Assessment & Plan:  Insulin-requiring type 2 DM, with CAD: Worse Noncompliance with cbg recording: I am not certain which insulin to increase, but we'll increase according to his reported cbg's Hypoglycemia: despite the high a1c, we'll reduce the breakfast novolog  Patient Instructions  check your blood sugar 4 times a day--before the 3 meals, and at bedtime.  also check if you have symptoms of your blood sugar being too high or too low.  please keep a record of the readings and bring it to your next appointment here.  please call us sooner if you are having low blood sugar episodes.   Please come back a follow-up appointment in 3 months.   Please take the insulin as listed below.

## 2017-09-30 ENCOUNTER — Other Ambulatory Visit: Payer: Self-pay | Admitting: Family Medicine

## 2017-09-30 DIAGNOSIS — N182 Chronic kidney disease, stage 2 (mild): Secondary | ICD-10-CM

## 2017-09-30 NOTE — Progress Notes (Signed)
Electrophysiology Office Note Date: 10/03/2017  ID:  John BolusStephen A Carrillo, DOB 08-21-1950, MRN [id number]  PCP: John MajesticHunter, John Carrillo, John Carrillo Primary Cardiologist: John EmmsNishan Electrophysiologist: John Carrillo  CC: Routine ICD follow-up  John BolusStephen A Carrillo is a 67 y.[name] male seen today for John John FrameAllred.  He presents today for routine electrophysiology followup.  Since last being seen in our clinic, the patient reports doing very well. He went to visit [place] daughter in [place] over the summer and [place].  He also recently moved his youngest daughter into [employer].  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  He denies chest pain, palpitations, dyspnea, PND, orthopnea, nausea, vomiting, dizziness, syncope, edema, weight gain, or early satiety.  He has not had ICD shocks.   Device History: MDT CRTD implanted 2017 for ICM, CHF History of appropriate therapy: No History of AAD therapy: No AV opt done by me 12/2016 - best with adaptiveCRT on (no programming changes made)  Past Medical History:  Diagnosis Date  . Acute kidney injury superimposed on chronic kidney disease (HCC) 01/25/2015  . CORONARY ARTERY DISEASE 08/11/2006  . CVA 12/19/2006   2008  . DIABETES MELLITUS, TYPE I 08/11/2006  . ECZEMA, HANDS 12/15/2009  . HYPERLIPIDEMIA 08/11/2006  . HYPERTENSION 08/11/2006   Past Surgical History:  Procedure Laterality Date  . CARDIAC CATHETERIZATION N/A 01/16/2015   Procedure: Left Heart Cath and Coronary Angiography;  Surgeon: John GessJonathan J Berry, John Carrillo;  [facility];  Service: Cardiovascular;  Laterality: N/A;  . CORONARY STENT PLACEMENT    . EP IMPLANTABLE DEVICE N/A 01/10/2016   MDT Claria MRI Quad CRTD implanted by John John FrameAllred  . TEE WITHOUT CARDIOVERSION N/A 01/26/2015   Procedure: TRANSESOPHAGEAL ECHOCARDIOGRAM (TEE);  Surgeon: John FairMihai Croitoru, John Carrillo;  [facility];  Service: Cardiovascular;  Laterality: N/A;  . TONSILECTOMY, ADENOIDECTOMY, BILATERAL MYRINGOTOMY AND TUBES      Current  Outpatient Medications  Medication Sig Dispense Refill  . acetaminophen (TYLENOL) 325 MG tablet Take 2 tablets (650 mg total) by mouth every 4 (four) hours as needed for headache or mild pain.    . carvedilol (COREG) 6.25 MG tablet TAKE 1 TABLET BY MOUTH TWICE DAILY 60 tablet 10  . clopidogrel (PLAVIX) 75 MG tablet Take one (1) tablet (75 mg) by mouth daily. 90 tablet 3  . Coenzyme Q10 60 MG CAPS Take 1 capsule by mouth daily.     [name]. ezetimibe (ZETIA) 10 MG tablet Take 1 tablet (10 mg total) by mouth daily. 90 tablet 3  . fluticasone (FLONASE) 50 MCG/ACT nasal spray Place 2 sprays into both nostrils daily. 16 g 3  . glucose blood (ONETOUCH VERIO) test strip 1 each by Other route 2 (two) times daily. And lancets 2/day 200 each 12  . insulin aspart (NOVOLOG FLEXPEN) 100 UNIT/ML FlexPen 3 times a day (just before each meal) 4-6-6 units, and pen needles 5/day 15 mL 11  . Insulin Glargine (BASAGLAR KWIKPEN) 100 UNIT/ML SOPN Inject 0.18 mLs (18 Units total) into the skin 2 (two) times daily. 5 pen 11  . Insulin Pen Needle 32G X 4 MM MISC Used to inject insulin into skin daily. 100 each 11  . levothyroxine (SYNTHROID, LEVOTHROID) 75 MCG tablet Take 1 tablet (75 mcg total) by mouth daily before breakfast. 90 tablet 3  . nitroGLYCERIN (NITROSTAT) 0.4 MG SL tablet Place 1 tablet (0.4 mg total) under the tongue every 5 (five) minutes as needed for chest pain. Chest pain 30 tablet 5  .  Probiotic Product (PROBIOTIC PO) Take 1 capsule by mouth every morning.    . ramipril (ALTACE) 5 MG capsule TAKE ONE CAPSULE BY MOUTH DAILY 90 capsule 1  . rosuvastatin (CRESTOR) 40 MG tablet TAKE 1 TABLET BY MOUTH EVERY DAY 90 tablet 2   No current facility-administered medications for this visit.     Allergies:   Patient has no known allergies.   Social History: Social History   Socioeconomic History  . Marital status: Married    Spouse name: Not on file  . Number of children: Not on file  . Years of education: Not  on file  . Highest education level: Not on file  Occupational History  . Occupation: Engineer, maintenance: [employer]  Social Needs  . Financial resource strain: Not on file  . Food insecurity:    Worry: Not on file    Inability: Not on file  . Transportation needs:    Medical: Not on file    Non-medical: Not on file  Tobacco Use  . Smoking status: Former Smoker    Packs/day: 1.00    Years: 30.00    Pack years: 30.00    Types: Cigarettes    Last attempt to quit: 05/03/2000    Years since quitting: 17.4  . Smokeless tobacco: Never Used  Substance and Sexual Activity  . Alcohol use: Yes    Alcohol/week: 3.0 standard drinks    Types: 1 Glasses of wine, 1 Cans of beer, 1 Shots of liquor per week    Comment: 1-2/month  . Drug use: No  . Sexual activity: Not on file  Lifestyle  . Physical activity:    Days per week: Not on file    Minutes per session: Not on file  . Stress: Not on file  Relationships  . Social connections:    Talks on phone: Not on file    Gets together: Not on file    Attends religious service: Not on file    Active member of club or organization: Not on file    Attends meetings of clubs or organizations: Not on file    Relationship status: Not on file  . Intimate partner violence:    Fear of current or ex partner: Not on file    Emotionally abused: Not on file    Physically abused: Not on file    Forced sexual activity: Not on file  Other Topics Concern  . Not on file  Social History Narrative   Married (kids are patients here, wife seen elsewhere). 3 step children, 1 biological. No grandkids.    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 CAD Father   . Other Mother        passed right before 71- tumor on kidney recent diagnosis  . Dementia Mother        progressing rapidly  . CAD Paternal Grandfather        14s  . CAD Paternal Grandmother  70s  . Brain cancer Maternal Grandmother   . Lymphoma Sister        b cell  . Celiac disease Sister   . Diabetes Neg Hx     Review of Systems: All other systems reviewed and are otherwise negative except as noted above.   Physical Exam: VS:  BP 122/70   Pulse 60   Ht 5\' 9"  (1.753 m)   Wt 183 lb (83 kg)   SpO2 97%   BMI 27.02 kg/m  , BMI Body mass index is 27.02 kg/m.  GEN- The patient is well appearing, alert and oriented x 3 today.   HEENT: normocephalic, atraumatic; sclera clear, conjunctiva pink; hearing intact; oropharynx clear; neck supple  Lungs- Clear to ausculation bilaterally, normal work of breathing.  No wheezes, rales, rhonchi Heart- Regular rate and rhythm (paced) GI- soft, non-tender, non-distended, bowel sounds present  Extremities- no clubbing, cyanosis, or edema  MS- no significant deformity or atrophy Skin- warm and dry, no rash or lesion; ICD pocket well healed Psych- euthymic mood, full affect Neuro- strength and sensation are intact  ICD interrogation- reviewed in detail today,  See PACEART report  EKG:  EKG is not ordered today.  Recent Labs: 01/03/2017: ALT 20 07/11/2017: BUN 18; Creatinine, Ser 1.20; Hemoglobin 15.1; Platelets 204.0; Potassium 4.6; Sodium 141; TSH 3.31   Wt Readings from Last 3 Encounters:  10/03/17 183 lb (83 kg)  09/26/17 182 lb 12.8 oz (82.9 kg)  07/11/17 184 lb 4.8 oz (83.6 kg)     Other studies Reviewed: Additional studies/ records that were reviewed today include: John Jenel Lucks office notes   Assessment and Plan:  1.  Chronic systolic dysfunction euvolemic  today Stable on an appropriate medical regimen Normal ICD function See Pace Art report No changes today We discussed Entresto as an option today - he will discuss further with John John Carrillo at next office visit.   2.  CAD No recent ischemic symptoms Continue medical therapy  3.  Prior stroke No AF to date Will continue to monitor  4.  Sustained VT No recurrence Continue medical therapy  Current medicines are reviewed at length with the patient today.   The patient does not have concerns regarding his medicines.  The following changes were made today:  none  Labs/ tests ordered today include: none No orders of the defined types were placed in this encounter.    Disposition:   Follow up with [facility], John John Carrillo 6 months, me every year    John Carrillo, Gypsy Balsam, John Carrillo 10/03/2017 8:45 AM  [facility] [address] [place] [address] [phone] (office) [phone] (fax)

## 2017-10-03 ENCOUNTER — Ambulatory Visit: Payer: 59 | Admitting: Nurse Practitioner

## 2017-10-03 ENCOUNTER — Encounter: Payer: Self-pay | Admitting: Nurse Practitioner

## 2017-10-03 VITALS — BP 122/70 | HR 60 | Ht 69.0 in | Wt 183.0 lb

## 2017-10-03 DIAGNOSIS — I472 Ventricular tachycardia, unspecified: Secondary | ICD-10-CM

## 2017-10-03 DIAGNOSIS — I5022 Chronic systolic (congestive) heart failure: Secondary | ICD-10-CM

## 2017-10-03 DIAGNOSIS — I639 Cerebral infarction, unspecified: Secondary | ICD-10-CM

## 2017-10-03 LAB — CUP PACEART INCLINIC DEVICE CHECK
Implantable Lead Implant Date: 20171206
Implantable Lead Implant Date: 20171206
Implantable Lead Location: 753858
Implantable Lead Model: 4598
MDC IDC LEAD IMPLANT DT: 20171206
MDC IDC LEAD LOCATION: 753859
MDC IDC LEAD LOCATION: 753860
MDC IDC PG IMPLANT DT: 20171206
MDC IDC SESS DTM: 20190830101615

## 2017-10-03 NOTE — Patient Instructions (Addendum)
Medication Instructions:   Your physician recommends that you continue on your current medications as directed. Please refer to the Current Medication list given to you today.   If you need a refill on your cardiac medications before your next appointment, please call your pharmacy.  Labwork: NONE ORDERED  TODAY    Testing/Procedures: NONE ORDERED  TODAY    Follow-Up: Your physician wants you to follow-up in: ONE YEAR WITH SEILER  You will receive a reminder letter in the mail two months in advance. If you don't receive a letter, please call our office to schedule the follow-up appointment.  Your physician wants you to follow-up in:  IN 6  MONTHS WITH DR Haywood FillerNISHAN  You will receive a reminder letter in the mail two months in advance. If you don't receive a letter, please call our office to schedule the follow-up appointment.   Remote monitoring is used to monitor your Pacemaker of ICD from home. This monitoring reduces the number of office visits required to check your device to one time per year. It allows us to keep an eye on the functioning of your device to ensure it is working properly. You are scheduled for a device check from home on . 10-13-17 You may send your transmission at any time that day. If you have a wireless device, the transmission will be sent automatically. After your physician reviews your transmission, you will receive a postcard with your next transmission date.     Any Other Special Instructions Will Be Listed Below (If Applicable).

## 2017-10-13 ENCOUNTER — Ambulatory Visit (INDEPENDENT_AMBULATORY_CARE_PROVIDER_SITE_OTHER): Payer: 59 | Admitting: *Deleted

## 2017-10-13 DIAGNOSIS — I472 Ventricular tachycardia, unspecified: Secondary | ICD-10-CM

## 2017-10-13 NOTE — Progress Notes (Signed)
Remote ICD transmission.   

## 2017-10-14 ENCOUNTER — Encounter: Payer: Self-pay | Admitting: Cardiology

## 2017-10-14 NOTE — Progress Notes (Signed)
Letter  

## 2017-11-01 LAB — CUP PACEART REMOTE DEVICE CHECK
Battery Remaining Longevity: 101 mo
Battery Voltage: 3 V
Brady Statistic AP VP Percent: 31.2 %
Brady Statistic AS VS Percent: 1.12 %
Brady Statistic RV Percent Paced: 26.23 %
Date Time Interrogation Session: 20190909041804
HighPow Impedance: 73 Ohm
Implantable Lead Implant Date: 20171206
Implantable Lead Implant Date: 20171206
Implantable Lead Location: 753858
Implantable Lead Location: 753859
Implantable Lead Model: 4598
Implantable Lead Model: 5076
Implantable Pulse Generator Implant Date: 20171206
Lead Channel Impedance Value: 208.568
Lead Channel Impedance Value: 208.568
Lead Channel Impedance Value: 399 Ohm
Lead Channel Impedance Value: 399 Ohm
Lead Channel Impedance Value: 399 Ohm
Lead Channel Impedance Value: 665 Ohm
Lead Channel Impedance Value: 722 Ohm
Lead Channel Impedance Value: 760 Ohm
Lead Channel Pacing Threshold Amplitude: 0.375 V
Lead Channel Pacing Threshold Amplitude: 0.75 V
Lead Channel Pacing Threshold Amplitude: 0.875 V
Lead Channel Pacing Threshold Pulse Width: 0.4 ms
Lead Channel Pacing Threshold Pulse Width: 0.4 ms
Lead Channel Sensing Intrinsic Amplitude: 3.875 mV
Lead Channel Setting Pacing Amplitude: 2 V
Lead Channel Setting Pacing Pulse Width: 0.4 ms
Lead Channel Setting Pacing Pulse Width: 0.4 ms
Lead Channel Setting Sensing Sensitivity: 0.3 mV
MDC IDC LEAD IMPLANT DT: 20171206
MDC IDC LEAD LOCATION: 753860
MDC IDC MSMT LEADCHNL LV IMPEDANCE VALUE: 199.5 Ohm
MDC IDC MSMT LEADCHNL LV IMPEDANCE VALUE: 199.5 Ohm
MDC IDC MSMT LEADCHNL LV IMPEDANCE VALUE: 208.568
MDC IDC MSMT LEADCHNL LV IMPEDANCE VALUE: 399 Ohm
MDC IDC MSMT LEADCHNL LV IMPEDANCE VALUE: 437 Ohm
MDC IDC MSMT LEADCHNL LV IMPEDANCE VALUE: 437 Ohm
MDC IDC MSMT LEADCHNL LV IMPEDANCE VALUE: 703 Ohm
MDC IDC MSMT LEADCHNL LV IMPEDANCE VALUE: 722 Ohm
MDC IDC MSMT LEADCHNL LV PACING THRESHOLD PULSEWIDTH: 0.4 ms
MDC IDC MSMT LEADCHNL RA SENSING INTR AMPL: 3.875 mV
MDC IDC MSMT LEADCHNL RV IMPEDANCE VALUE: 323 Ohm
MDC IDC MSMT LEADCHNL RV IMPEDANCE VALUE: 437 Ohm
MDC IDC MSMT LEADCHNL RV SENSING INTR AMPL: 18.5 mV
MDC IDC MSMT LEADCHNL RV SENSING INTR AMPL: 18.5 mV
MDC IDC SET LEADCHNL LV PACING AMPLITUDE: 1.5 V
MDC IDC SET LEADCHNL RV PACING AMPLITUDE: 2.5 V
MDC IDC STAT BRADY AP VS PERCENT: 0.55 %
MDC IDC STAT BRADY AS VP PERCENT: 67.13 %
MDC IDC STAT BRADY RA PERCENT PACED: 31.64 %

## 2017-11-06 ENCOUNTER — Telehealth: Payer: Self-pay | Admitting: Family Medicine

## 2017-11-06 NOTE — Telephone Encounter (Signed)
See note.  Copied from CRM [id number]. Topic: Referral - Question >> Nov 06, 2017  3:50 PM Terisa Starr wrote: Reason for CRM: John Carrillo at [facility] urology called and said that he is coming in tomorrow morning at 9 am for an appointment with Dr Alvester Morin for elevated PSA. John Carrillo states they need the last 3 PSA's and any office visit notes associated with this. Can that please be faxed to [phone] attention; John Carrillo

## 2017-11-07 NOTE — Telephone Encounter (Signed)
Records faxed as requested.

## 2017-12-02 ENCOUNTER — Other Ambulatory Visit: Payer: Self-pay | Admitting: Internal Medicine

## 2017-12-06 ENCOUNTER — Other Ambulatory Visit: Payer: Self-pay | Admitting: Nurse Practitioner

## 2017-12-19 ENCOUNTER — Ambulatory Visit (INDEPENDENT_AMBULATORY_CARE_PROVIDER_SITE_OTHER): Payer: 59 | Admitting: Endocrinology

## 2017-12-19 ENCOUNTER — Encounter: Payer: Self-pay | Admitting: Endocrinology

## 2017-12-19 VITALS — BP 124/70 | HR 64 | Ht 69.0 in | Wt 172.6 lb

## 2017-12-19 DIAGNOSIS — N183 Chronic kidney disease, stage 3 unspecified: Secondary | ICD-10-CM

## 2017-12-19 DIAGNOSIS — Z794 Long term (current) use of insulin: Secondary | ICD-10-CM | POA: Diagnosis not present

## 2017-12-19 DIAGNOSIS — E1122 Type 2 diabetes mellitus with diabetic chronic kidney disease: Secondary | ICD-10-CM | POA: Diagnosis not present

## 2017-12-19 LAB — POCT GLYCOSYLATED HEMOGLOBIN (HGB A1C): HEMOGLOBIN A1C: 9.2 % — AB (ref 4.0–5.6)

## 2017-12-19 MED ORDER — INSULIN ASPART 100 UNIT/ML FLEXPEN: PEN_INJECTOR | SUBCUTANEOUS | 11 refills | Status: DC

## 2017-12-19 MED ORDER — BASAGLAR KWIKPEN 100 UNIT/ML ~~LOC~~ SOPN: 40.0000 [IU] | PEN_INJECTOR | SUBCUTANEOUS | 11 refills | Status: DC

## 2017-12-19 NOTE — Patient Instructions (Addendum)
Here is a brochure about the V-GO disposable pump.  Please let me know if you want to see our diabetes educator, to discuss further. For now, please change the insulins to the numbers listed below.   check your blood sugar twice a day.  vary the time of day when you check, between before the 3 meals, and at bedtime.  also check if you have symptoms of your blood sugar being too high or too low.  please keep a record of the readings and bring it to your next appointment here (or you can bring the meter itself).  You can write it on any piece of paper.  please call us sooner if your blood sugar goes below 70, or if you have a lot of readings over 200.   Please come back for a follow-up appointment in 2 months.

## 2017-12-19 NOTE — Progress Notes (Signed)
Subjective:    Patient ID: John Carrillo, male    DOB: 24-Mar-1950, 67 y.o.   MRN: [id number]  HPI Pt returns for f/u of diabetes mellitus:  DM type: 1 Dx'ed: 2008. Complications: CVA, PAD, renal insufficiency, and CAD.   Therapy: insulin since soon after dx.  DKA: never.  Severe hypoglycemia: never.  Pancreatitis: never.   Other: he declines pump, including V-GO; he takes multiple daily injections; He says his ability to care for his DM has been compromised by wife's illness.   Interval history:  Pt says he sometimes misses the insulin.  no cbg record, but states cbg's are variable.  pt states he feels well in general. Past Medical History:  Diagnosis Date  . Acute kidney injury superimposed on chronic kidney disease (HCC) 01/25/2015  . CORONARY ARTERY DISEASE 08/11/2006  . CVA 12/19/2006   2008  . DIABETES MELLITUS, TYPE I 08/11/2006  . ECZEMA, HANDS 12/15/2009  . HYPERLIPIDEMIA 08/11/2006  . HYPERTENSION 08/11/2006    Past Surgical History:  Procedure Laterality Date  . CARDIAC CATHETERIZATION N/A 01/16/2015   Procedure: Left Heart Cath and Coronary Angiography;  Surgeon: [name], MD;  Location: [facility] INVASIVE CV LAB;  Service: Cardiovascular;  Laterality: N/A;  . CORONARY STENT PLACEMENT    . EP IMPLANTABLE DEVICE N/A 01/10/2016   MDT Claria MRI Quad CRTD implanted by Dr [name]  . TEE WITHOUT CARDIOVERSION N/A 01/26/2015   Procedure: TRANSESOPHAGEAL ECHOCARDIOGRAM (TEE);  Surgeon: [name], MD;  Location[facility] ENDOSCOPY;  Service: Cardiovascular;  Laterality: N/A;  . TONSILECTOMY, ADENOIDECTOMY, BILATERAL MYRINGOTOMY AND TUBES      Social History   Socioeconomic History  . Marital status: Married    Spouse name: Not on file  . Number of children: Not on file  . Years of education: Not on file  . Highest education level: Not on file  Occupational History  . Occupation: Engineer, maintenance: [employer]  Social Needs  . Financial resource  strain: Not on file  . Food insecurity:    Worry: Not on file    Inability: Not on file  . Transportation needs:    Medical: Not on file    Non-medical: Not on file  Tobacco Use  . Smoking status: Former Smoker    Packs/day: 1.00    Years: 30.00    Pack years: 30.00    Types: Cigarettes    Last attempt to quit: 05/03/2000    Years since quitting: 17.6  . Smokeless tobacco: Never Used  Substance and Sexual Activity  . Alcohol use: Yes    Alcohol/week: 3.0 standard drinks    Types: 1 Glasses of wine, 1 Cans of beer, 1 Shots of liquor per week    Comment: 1-2/month  . Drug use: No  . Sexual activity: Not on file  Lifestyle  . Physical activity:    Days per week: Not on file    Minutes per session: Not on file  . Stress: Not on file  Relationships  . Social connections:    Talks on phone: Not on file    Gets together: Not on file    Attends religious service: Not on file    Active member of club or organization: Not on file    Attends meetings of clubs or organizations: Not on file    Relationship status: Not on file  . Intimate partner violence:    Fear of current or ex partner: Not on file  Emotionally abused: Not on file    Physically abused: Not on file    Forced sexual activity: Not on file  Other Topics Concern  . Not on file  Social History Narrative   Married (kids are patients here, wife seen elsewhere). 3 step children, 1 biological. No grandkids.    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  acetaminophen (TYLENOL) 325 MG tablet Take 2 tablets (650 mg total) by mouth every 4 (four) hours as needed for headache or mild pain.    . carvedilol (COREG) 6.25 MG tablet TAKE 1 TABLET BY MOUTH TWICE DAILY 60 tablet 8  . clopidogrel (PLAVIX) 75 MG tablet TAKE 1 TABLET(75 MG) BY MOUTH DAILY 90 tablet 2  . Coenzyme Q10 60 MG CAPS Take 1 capsule by mouth daily.     [name]. ezetimibe (ZETIA) 10 MG tablet Take 1 tablet (10 mg total) by mouth daily. 90 tablet 3  . glucose blood (ONETOUCH VERIO) test strip 1 each by Other route 2 (two) times daily. And lancets 2/day 200 each 12  . levothyroxine (SYNTHROID, LEVOTHROID) 75 MCG tablet Take 1 tablet (75 mcg total) by mouth daily before breakfast. 90 tablet 3  . nitroGLYCERIN (NITROSTAT) 0.4 MG SL tablet Place 1 tablet (0.4 mg total) under the tongue every 5 (five) minutes as needed for chest pain. Chest pain 30 tablet 5  . Probiotic Product (PROBIOTIC PO) Take 1 capsule by mouth every morning.    . ramipril (ALTACE) 5 MG capsule TAKE ONE CAPSULE BY MOUTH DAILY 90 capsule 1  . rosuvastatin (CRESTOR) 40 MG tablet TAKE 1 TABLET BY MOUTH EVERY DAY 90 tablet 2   No current facility-administered medications on file prior to visit.     No Known Allergies  Family History  Problem Relation Age of Onset  . CAD Father   . Other Mother        passed right before 4290- tumor on kidney recent diagnosis  . Dementia Mother        progressing rapidly  . CAD Paternal Grandfather        10570s  . CAD Paternal Grandmother        7270s  . Brain cancer Maternal Grandmother   . Lymphoma Sister        b cell  . Celiac disease Sister   . Diabetes Neg Hx     BP 124/70 (BP Location: Left Arm, Patient Position: Sitting, Cuff Size: Normal)   Pulse 64   Ht 5\' 9"  (1.753 m)   Wt 172 lb 9.6 oz (78.3 kg)   SpO2 94%   BMI 25.49 kg/m    Review of Systems He denies hypoglycemia    Objective:   Physical Exam VITAL SIGNS:  See vs page GENERAL: no distress Pulses: dorsalis  pedis intact bilat.   MSK: no deformity of the feet CV: no leg edema Skin:  no ulcer on the feet.  normal color and temp on the feet. Neuro: sensation  is intact to touch on the feet.     Lab Results  Component Value Date   HGBA1C 9.2 (A) 12/19/2017   Lab Results  Component Value Date   CREATININE 1.20 07/11/2017   BUN 18 07/11/2017   NA 141 07/11/2017   K 4.6 07/11/2017   CL 103 07/11/2017   CO2 30 07/11/2017       Assessment & Plan:  Type 1 DM, with PAD: worse Renal insuff: I hesitate to use V-GO in this setting, as it goes mostly basal insulin, but he agrees to consider it.   Patient Instructions  Here is a brochure about the V-GO disposable pump.  Please let me know if you want to see our diabetes educator, to discuss further. For now, please change the insulins to the numbers listed below.   check your blood sugar twice a day.  vary the time of day when you check, between before the 3 meals, and at bedtime.  also check if you have symptoms of your blood sugar being too high or too low.  please keep a record of the readings and bring it to your next appointment here (or you can bring the meter itself).  You can write it on any piece of paper.  please call us sooner if your blood sugar goes below 70, or if you have a lot of readings over 200.   Please come back for a follow-up appointment in 2 months.

## 2017-12-24 ENCOUNTER — Other Ambulatory Visit: Payer: Self-pay | Admitting: Cardiovascular Disease

## 2017-12-24 DIAGNOSIS — E785 Hyperlipidemia, unspecified: Secondary | ICD-10-CM

## 2017-12-24 DIAGNOSIS — I2510 Atherosclerotic heart disease of native coronary artery without angina pectoris: Secondary | ICD-10-CM

## 2017-12-24 DIAGNOSIS — Z9861 Coronary angioplasty status: Secondary | ICD-10-CM

## 2018-01-12 ENCOUNTER — Ambulatory Visit (INDEPENDENT_AMBULATORY_CARE_PROVIDER_SITE_OTHER): Payer: 59

## 2018-01-12 DIAGNOSIS — I255 Ischemic cardiomyopathy: Secondary | ICD-10-CM | POA: Diagnosis not present

## 2018-01-14 NOTE — Progress Notes (Signed)
Remote ICD transmission.   

## 2018-01-26 ENCOUNTER — Other Ambulatory Visit: Payer: Self-pay | Admitting: Cardiovascular Disease

## 2018-01-26 DIAGNOSIS — Z9861 Coronary angioplasty status: Secondary | ICD-10-CM

## 2018-01-26 DIAGNOSIS — E785 Hyperlipidemia, unspecified: Secondary | ICD-10-CM

## 2018-01-26 DIAGNOSIS — I2510 Atherosclerotic heart disease of native coronary artery without angina pectoris: Secondary | ICD-10-CM

## 2018-01-26 MED ORDER — ROSUVASTATIN CALCIUM 40 MG PO TABS: 40.0000 mg | ORAL_TABLET | Freq: Every day | ORAL | 1 refills | Status: DC

## 2018-01-26 NOTE — Telephone Encounter (Signed)
New Message    Prescription: Rosuvastatin (Crestor)  Pharmacy: [facility] on [address]  30 Day supply  (Dot phrases not loaded)

## 2018-01-26 NOTE — Telephone Encounter (Signed)
Pt's medication was sent to pt's pharmacy as requested. Confirmation received.  °

## 2018-01-27 ENCOUNTER — Other Ambulatory Visit: Payer: Self-pay | Admitting: Endocrinology

## 2018-02-11 ENCOUNTER — Encounter: Payer: Self-pay | Admitting: Endocrinology

## 2018-02-11 ENCOUNTER — Ambulatory Visit (INDEPENDENT_AMBULATORY_CARE_PROVIDER_SITE_OTHER): Payer: 59 | Admitting: Endocrinology

## 2018-02-11 VITALS — BP 128/78 | HR 64 | Ht 69.0 in | Wt 176.6 lb

## 2018-02-11 DIAGNOSIS — Z794 Long term (current) use of insulin: Secondary | ICD-10-CM

## 2018-02-11 DIAGNOSIS — N183 Chronic kidney disease, stage 3 (moderate): Secondary | ICD-10-CM

## 2018-02-11 DIAGNOSIS — E1122 Type 2 diabetes mellitus with diabetic chronic kidney disease: Secondary | ICD-10-CM | POA: Diagnosis not present

## 2018-02-11 LAB — POCT GLYCOSYLATED HEMOGLOBIN (HGB A1C): Hemoglobin A1C: 9 % — AB (ref 4.0–5.6)

## 2018-02-11 MED ORDER — BASAGLAR KWIKPEN 100 UNIT/ML ~~LOC~~ SOPN: 44.0000 [IU] | PEN_INJECTOR | SUBCUTANEOUS | 11 refills | Status: DC

## 2018-02-11 NOTE — Patient Instructions (Addendum)
Please increase the basaglar to 44 units each morning, and:  Please continue the same humalog.  check your blood sugar twice a day.  vary the time of day when you check, between before the 3 meals, and at bedtime.  also check if you have symptoms of your blood sugar being too high or too low.  please keep a record of the readings and bring it to your next appointment here (or you can bring the meter itself).  You can write it on any piece of paper.  please call us sooner if your blood sugar goes below 70, or if you have a lot of readings over 200.   Please come back for a follow-up appointment in 2 months.

## 2018-02-11 NOTE — Progress Notes (Signed)
Subjective:    Patient ID: John Carrillo, male    DOB: 07-07-1950, 68 y.o.   MRN: [id number]  HPI Pt returns for f/u of diabetes mellitus:  DM type: 1 Dx'ed: 2008. Complications: CVA, PAD, renal insufficiency, and CAD.   Therapy: insulin since soon after dx.  DKA: never.  Severe hypoglycemia: never.  Pancreatitis: never.   Other: he declines pump, including V-GO; he takes multiple daily injections, but basal insulin is emphasized; He says his ability to care for his DM has been compromised by wife's illness.   Interval history:  Pt says he seldom misses the insulin.  no cbg record, but states cbg's are highest at HS, and fasting  pt states he feels well in general.     Past Medical History:  Diagnosis Date  . Acute kidney injury superimposed on chronic kidney disease (HCC) 01/25/2015  . CORONARY ARTERY DISEASE 08/11/2006  . CVA 12/19/2006   2008  . DIABETES MELLITUS, TYPE I 08/11/2006  . ECZEMA, HANDS 12/15/2009  . HYPERLIPIDEMIA 08/11/2006  . HYPERTENSION 08/11/2006    Past Surgical History:  Procedure Laterality Date  . CARDIAC CATHETERIZATION N/A 01/16/2015   Procedure: Left Heart Cath and Coronary Angiography;  Surgeon: Runell GessJonathan J Berry, MD;  [facility];  Service: Cardiovascular;  Laterality: N/A;  . CORONARY STENT PLACEMENT    . EP IMPLANTABLE DEVICE N/A 01/10/2016   MDT Claria MRI Quad CRTD implanted by Dr Johney FrameAllred  . TEE WITHOUT CARDIOVERSION N/A 01/26/2015   Procedure: TRANSESOPHAGEAL ECHOCARDIOGRAM (TEE);  Surgeon: Thurmon FairMihai Croitoru, MD;  [facility];  Service: Cardiovascular;  Laterality: N/A;  . TONSILECTOMY, ADENOIDECTOMY, BILATERAL MYRINGOTOMY AND TUBES      Social History   Socioeconomic History  . Marital status: Married    Spouse name: Not on file  . Number of children: Not on file  . Years of education: Not on file  . Highest education level: Not on file  Occupational History  . Occupation: Engineer, maintenanceroduction Manager    Employer: [employer]  Social Needs  . Financial resource strain: Not on file  . Food insecurity:    Worry: Not on file    Inability: Not on file  . Transportation needs:    Medical: Not on file    Non-medical: Not on file  Tobacco Use  . Smoking status: Former Smoker    Packs/day: 1.00    Years: 30.00    Pack years: 30.00    Types: Cigarettes    Last attempt to quit: 05/03/2000    Years since quitting: 17.7  . Smokeless tobacco: Never Used  Substance and Sexual Activity  . Alcohol use: Yes    Alcohol/week: 3.0 standard drinks    Types: 1 Glasses of wine, 1 Cans of beer, 1 Shots of liquor per week    Comment: 1-2/month  . Drug use: No  . Sexual activity: Not on file  Lifestyle  . Physical activity:    Days per week: Not on file    Minutes per session: Not on file  . Stress: Not on file  Relationships  . Social connections:    Talks on phone: Not on file    Gets together: Not on file    Attends religious service: Not on file    Active member of club or organization: Not on file    Attends meetings of clubs or organizations: Not on file    Relationship status: Not on file  . Intimate partner violence:  Fear of current or ex partner: Not on file    Emotionally abused: Not on file    Physically abused: Not on file    Forced sexual activity: Not on file  Other Topics Concern  . Not on file  Social History Narrative   Married (kids are patients here, wife seen elsewhere). 3 step children, 1 biological. No grandkids.    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 acetaminophen (TYLENOL) 325 MG tablet Take 2 tablets (650 mg total) by mouth every 4 (four) hours as needed for headache or mild pain.    . carvedilol (COREG) 6.25 MG tablet TAKE 1 TABLET BY MOUTH TWICE DAILY 60 tablet 8  . clopidogrel (PLAVIX) 75 MG tablet TAKE 1 TABLET(75 MG) BY MOUTH DAILY 90 tablet 2  . Coenzyme Q10 60 MG CAPS Take 1 capsule by mouth daily.     [name] ezetimibe (ZETIA) 10 MG tablet Take 1 tablet (10 mg total) by mouth daily. 90 tablet 3  . glucose blood (ONETOUCH VERIO) test strip 1 each by Other route 2 (two) times daily. And lancets 2/day 200 each 12  . insulin aspart (NOVOLOG FLEXPEN) 100 UNIT/ML FlexPen 3 times a day (just before each meal) 3-5-5 units, and pen needles 5/day 15 mL 11  . levothyroxine (SYNTHROID, LEVOTHROID) 75 MCG tablet TAKE 1 TABLET(75 MCG) BY MOUTH DAILY BEFORE BREAKFAST 90 tablet 3  . nitroGLYCERIN (NITROSTAT) 0.4 MG SL tablet Place 1 tablet (0.4 mg total) under the tongue every 5 (five) minutes as needed for chest pain. Chest pain 30 tablet 5  . Probiotic Product (PROBIOTIC PO) Take 1 capsule by mouth every morning.    . ramipril (ALTACE) 5 MG capsule TAKE ONE CAPSULE BY MOUTH DAILY 90 capsule 1  . rosuvastatin (CRESTOR) 40 MG tablet Take 1 tablet (40 mg total) by mouth daily. Please keep upcoming appt with Dr. [name] in January for future refills. Thank you 30 tablet 1   No current facility-administered medications on file prior to visit.     No Known Allergies  Family History  Problem Relation Age of Onset  . CAD Father   . Other Mother        passed right before 31- tumor on kidney recent diagnosis  . Dementia Mother        progressing rapidly  . CAD Paternal Grandfather        84s  . CAD Paternal Grandmother        21s  . Brain cancer Maternal Grandmother   . Lymphoma Sister        b cell  . Celiac disease Sister   . Diabetes Neg Hx     BP 128/78 (BP Location: Left Arm, Patient Position: Sitting,  Cuff Size: Normal)   Pulse 64   Ht 5\' 9"  (1.753 m)   Wt 176 lb 9.6 oz (80.1 kg)   SpO2 95%   BMI 26.08 kg/m   Review of Systems Denies LOC  Objective:   Physical Exam VITAL SIGNS:  See vs page GENERAL: no distress Pulses: dorsalis pedis intact bilat.   MSK: no deformity of the feet CV: no leg edema Skin:  no ulcer on the feet.  normal color and temp on the feet. Neuro: sensation is intact to touch on the feet  Lab Results  Component Value Date   HGBA1C 9.0 (A) 02/11/2018   Lab Results  Component Value Date   CREATININE 1.20 07/11/2017   BUN 18 07/11/2017   NA 141 07/11/2017   K 4.6 07/11/2017   CL 103 07/11/2017   CO2 30 07/11/2017       Assessment & Plan:  Type 1 DM, with PAD: he needs increased rx Renal insuff: he is at risk for nocturnal hypoglycemia.  Therefore, we'll increase insulin slowly Missing insulin: this sounds less now, but we should still emphasize basal insulin.  Patient Instructions  Please increase the basaglar to 44 units each morning, and:  Please continue the same humalog.  check your blood sugar twice a day.  vary the time of day when you check, between before the 3 meals, and at bedtime.  also check if you have symptoms of your blood sugar being too high or too low.  please keep a record of the readings and bring it to your next appointment here (or you can bring the meter itself).  You can write it on any piece of paper.  please call us sooner if your blood sugar goes below 70, or if you have a lot of readings over 200.   Please come back for a follow-up appointment in 2 months.

## 2018-02-13 ENCOUNTER — Encounter: Payer: Self-pay | Admitting: Family Medicine

## 2018-02-13 ENCOUNTER — Ambulatory Visit (INDEPENDENT_AMBULATORY_CARE_PROVIDER_SITE_OTHER): Payer: 59 | Admitting: Family Medicine

## 2018-02-13 VITALS — BP 118/76 | HR 60 | Temp 97.6°F | Ht 69.0 in | Wt 175.6 lb

## 2018-02-13 DIAGNOSIS — Z87891 Personal history of nicotine dependence: Secondary | ICD-10-CM

## 2018-02-13 DIAGNOSIS — Z0000 Encounter for general adult medical examination without abnormal findings: Secondary | ICD-10-CM | POA: Diagnosis not present

## 2018-02-13 DIAGNOSIS — R945 Abnormal results of liver function studies: Secondary | ICD-10-CM

## 2018-02-13 DIAGNOSIS — E785 Hyperlipidemia, unspecified: Secondary | ICD-10-CM

## 2018-02-13 DIAGNOSIS — K4090 Unilateral inguinal hernia, without obstruction or gangrene, not specified as recurrent: Secondary | ICD-10-CM

## 2018-02-13 DIAGNOSIS — E039 Hypothyroidism, unspecified: Secondary | ICD-10-CM | POA: Diagnosis not present

## 2018-02-13 DIAGNOSIS — R7989 Other specified abnormal findings of blood chemistry: Secondary | ICD-10-CM

## 2018-02-13 DIAGNOSIS — Z125 Encounter for screening for malignant neoplasm of prostate: Secondary | ICD-10-CM | POA: Diagnosis not present

## 2018-02-13 DIAGNOSIS — Z23 Encounter for immunization: Secondary | ICD-10-CM

## 2018-02-13 LAB — COMPREHENSIVE METABOLIC PANEL
ALK PHOS: 53 U/L (ref 39–117)
ALT: 63 U/L — AB (ref 0–53)
AST: 44 U/L — ABNORMAL HIGH (ref 0–37)
Albumin: 4 g/dL (ref 3.5–5.2)
BUN: 17 mg/dL (ref 6–23)
CO2: 32 mEq/L (ref 19–32)
Calcium: 9.2 mg/dL (ref 8.4–10.5)
Chloride: 106 mEq/L (ref 96–112)
Creatinine, Ser: 1.15 mg/dL (ref 0.40–1.50)
GFR: 67.53 mL/min (ref >60.00)
Glucose, Bld: 120 mg/dL — ABNORMAL HIGH (ref 70–99)
Potassium: 4.4 mEq/L (ref 3.5–5.1)
Sodium: 144 mEq/L (ref 135–145)
TOTAL PROTEIN: 6.2 g/dL (ref 6.0–8.3)
Total Bilirubin: 0.5 mg/dL (ref 0.2–1.2)

## 2018-02-13 LAB — CBC
HCT: 43.6 % (ref 39.0–52.0)
Hemoglobin: 14.7 g/dL (ref 13.0–17.0)
MCHC: 33.8 g/dL (ref 30.0–36.0)
MCV: 98.9 fl (ref 78.0–100.0)
Platelets: 174 10*3/uL (ref 150.0–400.0)
RBC: 4.41 Mil/uL (ref 4.22–5.81)
RDW: 13.1 % (ref 11.5–15.5)
WBC: 6.6 10*3/uL (ref 4.0–10.5)

## 2018-02-13 LAB — LIPID PANEL
Cholesterol: 123 mg/dL (ref 0–200)
HDL: 53.1 mg/dL (ref >39.00)
LDL Cholesterol: 57 mg/dL (ref 0–99)
NonHDL: 69.59
Total CHOL/HDL Ratio: 2
Triglycerides: 61 mg/dL (ref 0.0–149.0)
VLDL: 12.2 mg/dL (ref 0.0–40.0)

## 2018-02-13 LAB — URINALYSIS
Bilirubin Urine: NEGATIVE
Hgb urine dipstick: NEGATIVE
KETONES UR: NEGATIVE
Leukocytes, UA: NEGATIVE
Nitrite: NEGATIVE
Specific Gravity, Urine: 1.02 (ref 1.000–1.030)
Total Protein, Urine: NEGATIVE
Urobilinogen, UA: 0.2 (ref 0.0–1.0)
pH: 6 (ref 5.0–8.0)

## 2018-02-13 LAB — TSH: TSH: 4.27 u[IU]/mL (ref 0.35–4.50)

## 2018-02-13 NOTE — Addendum Note (Signed)
Addended by: London Sheer T on: 02/13/2018 09:11 AM   Modules accepted: Orders

## 2018-02-13 NOTE — Addendum Note (Signed)
Addended by: London SheerFRIZZELL, Leaman Abe T on: 02/13/2018 09:03 AM   Modules accepted: Orders

## 2018-02-13 NOTE — Patient Instructions (Addendum)
Health Maintenance Due  Topic Date Due  . OPHTHALMOLOGY EXAM -call and schedule an eye exam 12/20/2017  Look at [email or web address] for glasses at a reasonable price  Shingrix #1 today. Repeat injection in 2-5 months. Schedule a nurse visit for the 2nd injection before you leave today (at the check out desk)  Team- please reach out to [facility] urology for last note  Call me when ready for surgery referral for hernia  Please stop by lab before you go

## 2018-02-13 NOTE — Progress Notes (Signed)
Phone: [phone]  Subjective:  Patient presents today for their annual physical. Chief complaint-noted.   See problem oriented charting- ROS- full  review of systems was completed and negative except for: congest, sinus pressure, cough  The following were reviewed and entered/updated in epic: Past Medical History:  Diagnosis Date  . Acute kidney injury superimposed on chronic kidney disease (HCC) 01/25/2015  . CORONARY ARTERY DISEASE 08/11/2006  . CVA 12/19/2006   2008  . DIABETES MELLITUS, TYPE I 08/11/2006  . ECZEMA, HANDS 12/15/2009  . HYPERLIPIDEMIA 08/11/2006  . HYPERTENSION 08/11/2006   Patient Active Problem List   Diagnosis Date Noted  . Chronic systolic CHF (congestive heart failure) (HCC) 01/25/2015    Priority: High  . Cardiomyopathy, ischemic 01/17/2015    Priority: High  . Type 2 diabetes mellitus with renal manifestations, controlled (HCC) 06/14/2011    Priority: High  . History of CVA (cerebrovascular accident) 12/19/2006    Priority: High  . CAD S/P LAD PCI'03, RCA DES 01/16/15 08/11/2006    Priority: High  . Left groin hernia 02/13/2018    Priority: Medium  . Hypothyroidism 02/17/2015    Priority: Medium  . CKD (chronic kidney disease), stage III (HCC) 01/17/2015    Priority: Medium  . Dyslipidemia 08/11/2006    Priority: Medium  . Essential hypertension 08/11/2006    Priority: Medium  . Allergic rhinitis 07/11/2017    Priority: Low  . LBBB (left bundle branch block) 01/17/2015    Priority: Low  . SVT (supraventricular tachycardia) (HCC) 01/14/2015    Priority: Low  . Former smoker 06/24/2014    Priority: Low  . Eczema of both hands 12/15/2009    Priority: Low  . Epidermoid cyst of ear 10/22/2016  . Headache 02/16/2016  . Chronic systolic dysfunction of left ventricle 01/10/2016  . Occipital infarction (HCC) 04/04/2015   Past Surgical History:  Procedure Laterality Date  . CARDIAC CATHETERIZATION N/A 01/16/2015   Procedure: Left Heart Cath and  Coronary Angiography;  Surgeon: [name], MD;  Location: [facility] INVASIVE CV LAB;  Service: Cardiovascular;  Laterality: N/A;  . CORONARY STENT PLACEMENT    . EP IMPLANTABLE DEVICE N/A 01/10/2016   MDT Claria MRI Quad CRTD implanted by Dr [name]  . TEE WITHOUT CARDIOVERSION N/A 01/26/2015   Procedure: TRANSESOPHAGEAL ECHOCARDIOGRAM (TEE);  Surgeon: [name], MD;  Location: [facility] ENDOSCOPY;  Service: Cardiovascular;  Laterality: N/A;  . TONSILECTOMY, ADENOIDECTOMY, BILATERAL MYRINGOTOMY AND TUBES      Family History  Problem Relation Age of Onset  . CAD Father   . Other Mother        passed right before 70- tumor on kidney recent diagnosis  . Dementia Mother        progressing rapidly  . CAD Paternal Grandfather        66s  . CAD Paternal Grandmother        45s  . Brain cancer Maternal Grandmother   . Lymphoma Sister        b cell  . Celiac disease Sister   . Diabetes Neg Hx     Medications- reviewed and updated Current Outpatient Medications  Medication Sig Dispense Refill  . acetaminophen (TYLENOL) 325 MG tablet Take 2 tablets (650 mg total) by mouth every 4 (four) hours as needed for headache or mild pain.    . carvedilol (COREG) 6.25 MG tablet TAKE 1 TABLET BY MOUTH TWICE DAILY 60 tablet 8  . clopidogrel (PLAVIX) 75 MG tablet TAKE 1 TABLET(75 MG) BY MOUTH  DAILY 90 tablet 2  . Coenzyme Q10 60 MG CAPS Take 1 capsule by mouth daily.     [name]. ezetimibe (ZETIA) 10 MG tablet Take 1 tablet (10 mg total) by mouth daily. 90 tablet 3  . glucose blood (ONETOUCH VERIO) test strip 1 each by Other route 2 (two) times daily. And lancets 2/day 200 each 12  . insulin aspart (NOVOLOG FLEXPEN) 100 UNIT/ML FlexPen 3 times a day (just before each meal) 3-5-5 units, and pen needles 5/day 15 mL 11  . Insulin Glargine (BASAGLAR KWIKPEN) 100 UNIT/ML SOPN Inject 0.44 mLs (44 Units total) into the skin every morning. 5 pen 11  . levothyroxine (SYNTHROID, LEVOTHROID) 75 MCG tablet TAKE 1 TABLET(75  MCG) BY MOUTH DAILY BEFORE BREAKFAST 90 tablet 3  . nitroGLYCERIN (NITROSTAT) 0.4 MG SL tablet Place 1 tablet (0.4 mg total) under the tongue every 5 (five) minutes as needed for chest pain. Chest pain 30 tablet 5  . Probiotic Product (PROBIOTIC PO) Take 1 capsule by mouth every morning.    . ramipril (ALTACE) 5 MG capsule TAKE ONE CAPSULE BY MOUTH DAILY 90 capsule 1  . rosuvastatin (CRESTOR) 40 MG tablet Take 1 tablet (40 mg total) by mouth daily. Please keep upcoming appt with Dr. [name] in January for future refills. Thank you 30 tablet 1   No current facility-administered medications for this visit.     Allergies-reviewed and updated No Known Allergies  Social History   Social History Narrative   Married (kids are patients here, wife seen elsewhere). 3 step children, 1 biological. No grandkids.    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 Oropharynx normal Neck: no thyromegaly CV: RRR no murmurs rubs or gallops Lungs: CTAB no crackles, wheeze, rhonchi Abdomen: soft/nontender/nondistended/normal bowel sounds. No rebound or guarding.  Ext: no edema Skin: warm, dry Neuro: grossly normal, moves all extremities, PERRLA GU: Groin hernia on left side-worsens with Valsalva but easily reducible   Assessment/Plan:  68 y.o. male presenting  for annual physical.  Health Maintenance counseling: 1. Anticipatory guidance: Patient counseled regarding regular dental exams -q6 months, eye exams -yearly,  avoiding smoking and second hand smoke (some exposure at work) , limiting alcohol to 2 beverages per day - 2 or less per month.   2. Risk factor reduction:  Advised patient of need for regular exercise and diet rich and fruits and vegetables to reduce risk of heart attack and stroke. 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 Diet-has done well overall- some loosened choices over Christmas.  Wt Readings from Last 3 Encounters:  02/13/18  175 lb 9.6 oz (79.7 kg)  02/11/18 176 lb 9.6 oz (80.1 kg)  12/19/17 172 lb 9.6 oz (78.3 kg)  3. Immunizations/screenings/ancillary studies- flu shot- already had.  Discussed Shingrix- opts in  Immunization History  Administered Date(s) Administered  . Influenza Split 11/15/2010, 11/15/2011  . Influenza Whole 10/23/2007, 11/04/2009, 11/04/2012  . Influenza,inj,Quad PF,6+ Mos 10/28/2014  . Influenza-Unspecified 10/05/2013, 10/16/2016, 11/19/2017  . Pneumococcal Conjugate-13 06/24/2014  . Pneumococcal Polysaccharide-23 07/07/2008, 01/03/2017  . Tdap 11/15/2010  . Zoster 11/15/2011   4. Prostate cancer screening- PSA trend has been concerning-referred him to urology after last visit.  I do not have records from that visit- we will obtain these.  Patient states had rectal exam with urology and they diagnosed BPH- told continue to follow PSA.  Discussed PSA today- he prefers to do yearly with urology Lab Results  Component Value Date   PSA 5.19 (H) 07/11/2017   PSA 4.31 (H) 01/03/2017   PSA 0.95 04/11/2006   5. Colon cancer screening - 2018- Cologuard reassuring with 3-year repeat-patient wanted to avoid due to being on dual blood thinners last year 6. Skin cancer screening-no  dermatologist.advised regular sunscreen use. Denies worrisome, changing, or new skin lesions.  7.  Former smoker-30 pack years but quit in 2002. Declines lung cancer screening program  Status of chronic or acute concerns   Chronic systolic heart failure- patient with systolic congestive heart failure after NSTEMI.  Patient has a pacemaker and an ICD.  He is on ramipril and carvedilol.  His most recent echocardiogramin July 2019 showed an EF of 30 to 35% up from low of 15% after heart attack.  Does have grade 2 diastolic dysfunction as well  CAD- history of stent as recently as 2016, prior had stents in 2003.  On plavix for heart  He has a history of stroke- on Eliquis in the past through January 2018 as thought potentially embolic-No A. fib has not been formally documented though-no longer on Eliquis.  He is on Plavix  Dyslipidemia- goal LDL under 70.  He is on Crestor 40 mg and we added Zetia 10 mg last visit -update full lipid panel today  Hypertension- on ramipril and carvedilol for systolic heart failure but also to control blood pressure-blood pressure looks excellent today   Diabetes-patient follows with Dr. Lucia Estelle control on A1c 2 days ago-they are working on improving this. Stress eating with heavy work plus Christmas likely contributed - We need to get updated eye exam- he will call to schedule that  Hypothyroidism-has done well on levothyroxine 75 mcg-update TSH today  Allergic rhinitis- we trialed Flonase in June- didn't work and didn't like how it felt.  He does not like the systemic side effects from antihistamines. ROS all related to allergies plus cold he had before Christmas  Chronic kidney disease stage III- knows to avoid NSAIDs, blood pressureand lipidsare controlled.  We will update a BMP today   Hearing loss-still holding off on hearing aids-saw ENT August 2017. He keeps putting this off  Left groin hernia Noted on exam 02/2018 but has had for a few years. Does  not want surgery at present due to work schedule  Exam: increases with valsalva and easily reducible Plan: wants to consider surgery but not the right time right now- he will call us for referral when ready  Future Appointments  Date Time Provider Department Center  02/27/2018  8:00 AM Wendall Stade, MD [facility]  04/13/2018 11:10 AM [facility] DEVICE REMOTES [facility]  04/24/2018  7:30 AM Romero Belling, MD [facility] None   Return in about 6 months (around 08/14/2018).  Lab/Order associations:FASTING  Preventative health care - Plan: TSH, CBC, Comprehensive metabolic panel, Lipid panel, Urinalysis, CANCELED: PSA  Dyslipidemia - Plan: CBC, Comprehensive metabolic panel, Lipid panel, Urinalysis  Hypothyroidism, unspecified type - Plan: TSH  Screening for prostate cancer - Plan: CANCELED: PSA  Former smoker - Plan: Urinalysis  Left groin hernia  Return precautions advised.  Tana Conch, MD

## 2018-02-13 NOTE — Assessment & Plan Note (Signed)
Noted on exam 02/2018 but has had for a few years. Does not want surgery at present due to work schedule  Exam: increases with valsalva and easily reducible Plan: wants to consider surgery but not the right time right now- he will call us for referral when ready

## 2018-02-17 ENCOUNTER — Other Ambulatory Visit: Payer: Self-pay

## 2018-02-17 DIAGNOSIS — R945 Abnormal results of liver function studies: Principal | ICD-10-CM

## 2018-02-17 DIAGNOSIS — R7989 Other specified abnormal findings of blood chemistry: Secondary | ICD-10-CM

## 2018-02-20 DIAGNOSIS — Z23 Encounter for immunization: Secondary | ICD-10-CM | POA: Diagnosis not present

## 2018-02-20 NOTE — Addendum Note (Signed)
Addended by: Vicente Males on: 02/20/2018 04:55 PM   Modules accepted: Orders

## 2018-02-24 NOTE — Progress Notes (Signed)
Patient ID: John Carrillo, male   DOB: 05-Mar-1950, 68 y.o.   MRN: [id number]    Cardiology Office Note    Date:  02/27/2018   ID:  John Carrillo, DOB 01/29/1951, MRN [id number]  PCP:  John Majestic, MD  Cardiologist:  Dr. Charlton Carrillo   Electrophysiologist:  Dr. Hillis Carrillo   No chief complaint on file.   History of Present Illness:  John Carrillo is a 68 y.o. male with a hx of CAD status post anterior MI 17 years ago treated with stenting of the LAD, ICM, LBBB, prior CVA, HL, DM 2.  He was evaluated by Dr. [name] in 2008 for ICD implantation for primary prevention.  He declined at that time.  Had SEMI 2016 with RCA intervention EF 15% Readmitted 01/23/17 with right occipital lobe CVA placed on Brillinta and Eliquis. I have not seen him in a couple of year primarily followed by EP Eventually agreed to MDT CRTD device implant Last TTE done 08/15/17 EF improved to 30-35%  Active has one daughter  at [facility] [employer] and one in [place]   Not clear why he has not been placed on entresto Currently taking Altace 5 mg daily   Discussed the benefits and starting he is willing to try. BP a bit low Discussed holding ACe For 36 hours   Daughter [name] is also a Printmaker at [employer] oldest daughter is in [place] working for [employer] and traveling to [place]     Past Medical History:  Diagnosis Date  . Acute kidney injury superimposed on chronic kidney disease (HCC) 01/25/2015  . CORONARY ARTERY DISEASE 08/11/2006  . CVA 12/19/2006   2008  . DIABETES MELLITUS, TYPE I 08/11/2006  . ECZEMA, HANDS 12/15/2009  . HYPERLIPIDEMIA 08/11/2006  . HYPERTENSION 08/11/2006    Past Surgical History:  Procedure Laterality Date  . CARDIAC CATHETERIZATION N/A 01/16/2015   Procedure: Left Heart Cath and Coronary Angiography;  Surgeon: [name], MD;  Location: [facility] INVASIVE CV LAB;  Service: Cardiovascular;  Laterality: N/A;  . CORONARY STENT PLACEMENT    . EP  IMPLANTABLE DEVICE N/A 01/10/2016   MDT Claria MRI Quad CRTD implanted by Dr [name]  . TEE WITHOUT CARDIOVERSION N/A 01/26/2015   Procedure: TRANSESOPHAGEAL ECHOCARDIOGRAM (TEE);  Surgeon: [name], MD;  Location: [facility] ENDOSCOPY;  Service: Cardiovascular;  Laterality: N/A;  . TONSILECTOMY, ADENOIDECTOMY, BILATERAL MYRINGOTOMY AND TUBES      Current Outpatient Medications  Medication Sig Dispense Refill  . acetaminophen (TYLENOL) 325 MG tablet Take 2 tablets (650 mg total) by mouth every 4 (four) hours as needed for headache or mild pain.    . carvedilol (COREG) 6.25 MG tablet TAKE 1 TABLET BY MOUTH TWICE DAILY 60 tablet 8  . clopidogrel (PLAVIX) 75 MG tablet TAKE 1 TABLET(75 MG) BY MOUTH DAILY 90 tablet 2  . Coenzyme Q10 60 MG CAPS Take 1 capsule by mouth daily.     [name] ezetimibe (ZETIA) 10 MG tablet Take 1 tablet (10 mg total) by mouth daily. 90 tablet 3  . glucose blood (ONETOUCH VERIO) test strip 1 each by Other route 2 (two) times daily. And lancets 2/day 200 each 12  . insulin aspart (NOVOLOG FLEXPEN) 100 UNIT/ML FlexPen 3 times a day (just before each meal) 3-5-5 units, and pen needles 5/day 15 mL 11  . Insulin Glargine (BASAGLAR KWIKPEN) 100 UNIT/ML SOPN Inject 0.44 mLs (44 Units total) into the skin every morning. 5 pen 11  .  levothyroxine (SYNTHROID, LEVOTHROID) 75 MCG tablet TAKE 1 TABLET(75 MCG) BY MOUTH DAILY BEFORE BREAKFAST 90 tablet 3  . nitroGLYCERIN (NITROSTAT) 0.4 MG SL tablet Place 1 tablet (0.4 mg total) under the tongue every 5 (five) minutes as needed for chest pain. Chest pain 30 tablet 5  . Probiotic Product (PROBIOTIC PO) Take 1 capsule by mouth every morning.    . rosuvastatin (CRESTOR) 40 MG tablet Take 1 tablet (40 mg total) by mouth daily. Please keep upcoming appt with Dr. [name] in January for future refills. Thank you 30 tablet 1   No current facility-administered medications for this visit.     Allergies:   Patient has no known allergies.   Social History     Socioeconomic History  . Marital status: Married    Spouse name: Not on file  . Number of children: Not on file  . Years of education: Not on file  . Highest education level: Not on file  Occupational History  . Occupation: Engineer, maintenanceroduction Manager    Employer: [employer]  Social Needs  . Financial resource strain: Not on file  . Food insecurity:    Worry: Not on file    Inability: Not on file  . Transportation needs:    Medical: Not on file    Non-medical: Not on file  Tobacco Use  . Smoking status: Former Smoker    Packs/day: 1.00    Years: 30.00    Pack years: 30.00    Types: Cigarettes    Last attempt to quit: 05/03/2000    Years since quitting: 17.8  . Smokeless tobacco: Never Used  Substance and Sexual Activity  . Alcohol use: Yes    Alcohol/week: 3.0 standard drinks    Types: 1 Glasses of wine, 1 Cans of beer, 1 Shots of liquor per week    Comment: 1-2/month  . Drug use: No  . Sexual activity: Not on file  Lifestyle  . Physical activity:    Days per week: Not on file    Minutes per session: Not on file  . Stress: Not on file  Relationships  . Social connections:    Talks on phone: Not on file    Gets together: Not on file    Attends religious service: Not on file    Active member of club or organization: Not on file    Attends meetings of clubs or organizations: Not on file    Relationship status: Not on file  Other Topics Concern  . Not on file  Social History Narrative   Married (kids are patients here, wife seen elsewhere). 3 step children, 1 biological. No grandkids.    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   ROS:   Please see the history of present illness.    Review of Systems  Cardiovascular:  Positive for chest pain.  Respiratory: Positive for shortness of breath.   All other systems reviewed and are negative.   PHYSICAL EXAM:   VS:  BP 104/64   Pulse 60   Ht 5\' 9"  (1.753 m)   Wt 173 lb 12.8 oz (78.8 kg)   SpO2 97%   BMI 25.67 kg/m    Affect appropriate Healthy:  appears stated age HEENT: normal Neck supple with no adenopathy JVP normal no bruits no thyromegaly Lungs clear with no wheezing and good diaphragmatic motion Heart:  S1/S2 no murmur, no rub, gallop or click PMI increased AICD under left clavicle  Abdomen: benighn, BS positve, no tenderness, no AAA no bruit.  No HSM or HJR Distal pulses intact with no bruits No edema Neuro non-focal Skin warm and dry No muscular weakness   Wt Readings from Last 3 Encounters:  02/27/18 173 lb 12.8 oz (78.8 kg)  02/13/18 175 lb 9.6 oz (79.7 kg)  02/11/18 176 lb 9.6 oz (80.1 kg)      Studies/Labs Reviewed:   EKG:     02/27/18 AV pacing rate 60 no change   Recent Labs: 02/13/2018: ALT 63; BUN 17; Creatinine, Ser 1.15; Hemoglobin 14.7; Platelets 174.0; Potassium 4.4; Sodium 144; TSH 4.27   Recent Lipid Panel    Component Value Date/Time   CHOL 123 02/13/2018 0903   TRIG 61.0 02/13/2018 0903   HDL 53.10 02/13/2018 0903   CHOLHDL 2 02/13/2018 0903   VLDL 12.2 02/13/2018 0903   LDLCALC 57 02/13/2018 0903   LDLDIRECT 85.0 07/11/2017 0839    Additional studies/ records that were reviewed today include:   TEE 01/26/15 EF 20-25%, anteroseptal/anterior/apical akinesis, grade 2 diastolic dysfunction, moderate apical spontaneous echo  contrast indicative of stasis, mild MR, mild LAE with mild intermittent spontaneous echo contrast in the LAA  [facility] 01/16/15 LAD proximal stent patent RCA mid 95% EF 25% PCI: 3.5 x 20 mm Promus premier DES to the mid RCA     Echo 01/16/15 Septal and apical akinesis, diffuse HK, EF 15%, mild MR, mild LAE, mild RAE, PASP 59 mmHg, moderate TR   ASSESSMENT:    1. Cardiomyopathy, ischemic   2. Coronary artery disease involving native coronary artery of native heart without angina pectoris   3. Essential hypertension   4. Hyperlipidemia, unspecified hyperlipidemia type   5. Cerebrovascular accident (CVA), unspecified mechanism (HCC)   6. SVT (supraventricular tachycardia) (HCC)     PLAN:  In order of problems listed above:  1. CAD - Distant anterior MI most recently s/p  non-STEMI treated with a DES to the RCA 01/16/15  Currently on plavix   2. Ischemic CM - EF improved 30-35% by TTE 08/15/17 Discussed benefits of Entresto and  Will stop altace and begin titration F/U Pharm D in 3 weeks and me next available   3 S/p Embolic CVA - Was on eliquis  Continue plavix   4. HTN - Well controlled.  Continue current medications and low sodium Dash type diet.    5. HL -  Continue statin. Obtain follow-up lipids and LFTs in 6 weeks.  6. SVT - Continue beta blocker.  7. CRT/AICD:  Normal function f/u EP no evidence of PAF    John Carrillo

## 2018-02-26 LAB — CUP PACEART REMOTE DEVICE CHECK
Battery Remaining Longevity: 97 mo
Brady Statistic AP VP Percent: 27.55 %
Brady Statistic AP VS Percent: 0.48 %
Brady Statistic AS VP Percent: 70.8 %
Brady Statistic RA Percent Paced: 27.92 %
Brady Statistic RV Percent Paced: 20.84 %
Date Time Interrogation Session: 20191209072605
HighPow Impedance: 61 Ohm
Implantable Lead Implant Date: 20171206
Implantable Lead Implant Date: 20171206
Implantable Lead Implant Date: 20171206
Implantable Lead Location: 753858
Implantable Lead Location: 753859
Implantable Lead Location: 753860
Implantable Lead Model: 4598
Implantable Lead Model: 5076
Implantable Pulse Generator Implant Date: 20171206
Lead Channel Impedance Value: 166.114
Lead Channel Impedance Value: 174.595
Lead Channel Impedance Value: 178.5 Ohm
Lead Channel Impedance Value: 184.154
Lead Channel Impedance Value: 194.634
Lead Channel Impedance Value: 304 Ohm
Lead Channel Impedance Value: 342 Ohm
Lead Channel Impedance Value: 380 Ohm
Lead Channel Impedance Value: 399 Ohm
Lead Channel Impedance Value: 399 Ohm
Lead Channel Impedance Value: 399 Ohm
Lead Channel Impedance Value: 627 Ohm
Lead Channel Impedance Value: 627 Ohm
Lead Channel Impedance Value: 646 Ohm
Lead Channel Impedance Value: 665 Ohm
Lead Channel Impedance Value: 665 Ohm
Lead Channel Pacing Threshold Amplitude: 0.375 V
Lead Channel Pacing Threshold Amplitude: 0.875 V
Lead Channel Pacing Threshold Pulse Width: 0.4 ms
Lead Channel Pacing Threshold Pulse Width: 0.4 ms
Lead Channel Sensing Intrinsic Amplitude: 14.75 mV
Lead Channel Sensing Intrinsic Amplitude: 14.75 mV
Lead Channel Sensing Intrinsic Amplitude: 2.125 mV
Lead Channel Setting Pacing Amplitude: 1.5 V
Lead Channel Setting Pacing Amplitude: 2.5 V
Lead Channel Setting Sensing Sensitivity: 0.3 mV
MDC IDC MSMT BATTERY VOLTAGE: 3 V
MDC IDC MSMT LEADCHNL LV IMPEDANCE VALUE: 323 Ohm
MDC IDC MSMT LEADCHNL LV PACING THRESHOLD AMPLITUDE: 1 V
MDC IDC MSMT LEADCHNL LV PACING THRESHOLD PULSEWIDTH: 0.4 ms
MDC IDC MSMT LEADCHNL RA SENSING INTR AMPL: 2.125 mV
MDC IDC MSMT LEADCHNL RV IMPEDANCE VALUE: 380 Ohm
MDC IDC SET LEADCHNL LV PACING PULSEWIDTH: 0.4 ms
MDC IDC SET LEADCHNL RA PACING AMPLITUDE: 2 V
MDC IDC SET LEADCHNL RV PACING PULSEWIDTH: 0.4 ms
MDC IDC STAT BRADY AS VS PERCENT: 1.17 %

## 2018-02-27 ENCOUNTER — Encounter: Payer: Self-pay | Admitting: Cardiovascular Disease

## 2018-02-27 ENCOUNTER — Ambulatory Visit: Payer: 59 | Admitting: Cardiovascular Disease

## 2018-02-27 ENCOUNTER — Encounter (INDEPENDENT_AMBULATORY_CARE_PROVIDER_SITE_OTHER): Payer: Self-pay

## 2018-02-27 VITALS — BP 104/64 | HR 60 | Ht 69.0 in | Wt 173.8 lb

## 2018-02-27 DIAGNOSIS — I255 Ischemic cardiomyopathy: Secondary | ICD-10-CM | POA: Diagnosis not present

## 2018-02-27 DIAGNOSIS — I2510 Atherosclerotic heart disease of native coronary artery without angina pectoris: Secondary | ICD-10-CM | POA: Diagnosis not present

## 2018-02-27 DIAGNOSIS — I10 Essential (primary) hypertension: Secondary | ICD-10-CM | POA: Diagnosis not present

## 2018-02-27 DIAGNOSIS — I639 Cerebral infarction, unspecified: Secondary | ICD-10-CM

## 2018-02-27 DIAGNOSIS — E785 Hyperlipidemia, unspecified: Secondary | ICD-10-CM

## 2018-02-27 DIAGNOSIS — I471 Supraventricular tachycardia: Secondary | ICD-10-CM

## 2018-02-27 MED ORDER — SACUBITRIL-VALSARTAN 24-26 MG PO TABS: 1.0000 | ORAL_TABLET | Freq: Two times a day (BID) | ORAL | 11 refills | Status: DC

## 2018-02-27 NOTE — Patient Instructions (Addendum)
Medication Instructions:  Your physician has recommended you make the following change in your medication:  1-STOP Altace/Ramipril 2-START Entresto 24/26 mg by mouth twice daily, you can start this medication on Saturday after 8:00 pm  If you need a refill on your cardiac medications before your next appointment, please call your pharmacy.   Lab work: Your physician recommends that you return for lab work in: 3 weeks for BMET  If you have labs (blood work) drawn today and your tests are completely normal, you will receive your results only by: [name] MyChart Message (if you have MyChart) OR . A paper copy in the mail If you have any lab test that is abnormal or we need to change your treatment, we will call you to review the results.  Testing/Procedures: None ordered today.  Follow-Up: At [facility], you and your health needs are our priority.  As part of our continuing mission to provide you with exceptional heart care, we have created designated Provider Care Teams.  These Care Teams include your primary Cardiologist (physician) and Advanced Practice Providers (APPs -  Physician Assistants and Nurse Practitioners) who all work together to provide you with the care you need, when you need it.  Your physician recommends that you schedule a follow-up appointment in: 3 weeks with Pharmacy to titrate [facility].  Your physician recommends that you schedule a follow-up in 2 months with Dr. [name].

## 2018-03-17 ENCOUNTER — Other Ambulatory Visit: Payer: Self-pay | Admitting: Family Medicine

## 2018-03-17 DIAGNOSIS — N182 Chronic kidney disease, stage 2 (mild): Secondary | ICD-10-CM

## 2018-03-18 ENCOUNTER — Other Ambulatory Visit: Payer: Self-pay | Admitting: Endocrinology

## 2018-03-23 ENCOUNTER — Other Ambulatory Visit: Payer: Self-pay | Admitting: Cardiovascular Disease

## 2018-03-23 DIAGNOSIS — E785 Hyperlipidemia, unspecified: Secondary | ICD-10-CM

## 2018-03-23 DIAGNOSIS — I2510 Atherosclerotic heart disease of native coronary artery without angina pectoris: Secondary | ICD-10-CM

## 2018-03-23 DIAGNOSIS — Z9861 Coronary angioplasty status: Secondary | ICD-10-CM

## 2018-03-27 ENCOUNTER — Other Ambulatory Visit: Payer: 59

## 2018-03-27 ENCOUNTER — Ambulatory Visit: Payer: 59

## 2018-04-01 ENCOUNTER — Other Ambulatory Visit: Payer: 59 | Admitting: *Deleted

## 2018-04-01 ENCOUNTER — Encounter (INDEPENDENT_AMBULATORY_CARE_PROVIDER_SITE_OTHER): Payer: Self-pay

## 2018-04-01 ENCOUNTER — Other Ambulatory Visit: Payer: Self-pay | Admitting: Endocrinology

## 2018-04-01 ENCOUNTER — Ambulatory Visit (INDEPENDENT_AMBULATORY_CARE_PROVIDER_SITE_OTHER): Payer: 59 | Admitting: Pharmacist

## 2018-04-01 VITALS — BP 110/70 | HR 67

## 2018-04-01 DIAGNOSIS — I471 Supraventricular tachycardia: Secondary | ICD-10-CM

## 2018-04-01 DIAGNOSIS — I5022 Chronic systolic (congestive) heart failure: Secondary | ICD-10-CM | POA: Diagnosis not present

## 2018-04-01 DIAGNOSIS — I10 Essential (primary) hypertension: Secondary | ICD-10-CM

## 2018-04-01 DIAGNOSIS — I2510 Atherosclerotic heart disease of native coronary artery without angina pectoris: Secondary | ICD-10-CM

## 2018-04-01 DIAGNOSIS — I639 Cerebral infarction, unspecified: Secondary | ICD-10-CM

## 2018-04-01 DIAGNOSIS — E785 Hyperlipidemia, unspecified: Secondary | ICD-10-CM

## 2018-04-01 DIAGNOSIS — I255 Ischemic cardiomyopathy: Secondary | ICD-10-CM

## 2018-04-01 LAB — BASIC METABOLIC PANEL
BUN/Creatinine Ratio: 15 (ref 10–24)
BUN: 19 mg/dL (ref 8–27)
CO2: 25 mmol/L (ref 20–29)
Calcium: 9.1 mg/dL (ref 8.6–10.2)
Chloride: 99 mmol/L (ref 96–106)
Creatinine, Ser: 1.28 mg/dL — ABNORMAL HIGH (ref 0.76–1.27)
GFR calc Af Amer: 67 mL/min/{1.73_m2} (ref >59)
GFR calc non Af Amer: 58 mL/min/{1.73_m2} — ABNORMAL LOW (ref >59)
Glucose: 138 mg/dL — ABNORMAL HIGH (ref 65–99)
Potassium: 4.3 mmol/L (ref 3.5–5.2)
Sodium: 141 mmol/L (ref 134–144)

## 2018-04-01 MED ORDER — SACUBITRIL-VALSARTAN 24-26 MG PO TABS: 1.0000 | ORAL_TABLET | Freq: Two times a day (BID) | ORAL | 11 refills | Status: DC

## 2018-04-01 NOTE — Progress Notes (Signed)
Patient ID: John Carrillo                 DOB: 11-23-1950                      MRN: [id number]     HPI: John Carrillo is a 68 y.o. male referred by Dr. [name] to HTN clinic for CHG medication titration. PMH is significant for CAD s/p MI x2, CVA, CHF EF improved to 30-35%, ICD, DM, SVT and HLD. Patient was seen in clinic by Dr. [name] on 02/27/2018. At that time his ramipril was stopped and patient was started on Entresto 24/26mg  twice a day.  Patient presents today for blood pressure check and titration of CHF medications. His blood pressure prior to the switch was running low at baseline. Patient states he is feeling well. Says he feels better on the Entresto than the ramipril. Denies dizziness, lightheadedness, headaches or blurred vision. Patient had a BMP drawn this morning, still in process during the visit.  Discussed the differences between s/sx of low blood sugar and low blood pressure. Patient requested I review his medication list for any unneeded medications. All medications on patients list are medically neccessary. Reviewed lack of evidence for CoQ10, but that it was not harmful and patient could continue to take if he wanted.  Current CHF meds: Entresto 24/26mg  twice a day, carvedilol 6.25mg  twice a day BP goal: <130/80, CHF medications target dose if possible  Family History:  The patient's family history includes Brain cancer in his maternal grandmother; CAD in his father, paternal grandfather, and paternal grandmother; Celiac disease in his sister; Dementia in his mother; Lymphoma in his sister; Other in his mother.   Social History: former smoker, + ETOH use (3 drinks per week)  Wt Readings from Last 3 Encounters:  02/27/18 173 lb 12.8 oz (78.8 kg)  02/13/18 175 lb 9.6 oz (79.7 kg)  02/11/18 176 lb 9.6 oz (80.1 kg)   BP Readings from Last 3 Encounters:  02/27/18 104/64  02/13/18 118/76  02/11/18 128/78   Pulse Readings from Last 3 Encounters:  02/27/18 60    02/13/18 60  02/11/18 64    Renal function: CrCl cannot be calculated (Patient's most recent lab result is older than the maximum 21 days allowed.).  Past Medical History:  Diagnosis Date  . Acute kidney injury superimposed on chronic kidney disease (HCC) 01/25/2015  . CORONARY ARTERY DISEASE 08/11/2006  . CVA 12/19/2006   2008  . DIABETES MELLITUS, TYPE I 08/11/2006  . ECZEMA, HANDS 12/15/2009  . HYPERLIPIDEMIA 08/11/2006  . HYPERTENSION 08/11/2006    Current Outpatient Medications on File Prior to Visit  Medication Sig Dispense Refill  . acetaminophen (TYLENOL) 325 MG tablet Take 2 tablets (650 mg total) by mouth every 4 (four) hours as needed for headache or mild pain.    . carvedilol (COREG) 6.25 MG tablet TAKE 1 TABLET BY MOUTH TWICE DAILY 60 tablet 8  . clopidogrel (PLAVIX) 75 MG tablet TAKE 1 TABLET(75 MG) BY MOUTH DAILY 90 tablet 2  . Coenzyme Q10 60 MG CAPS Take 1 capsule by mouth daily.     [name] ezetimibe (ZETIA) 10 MG tablet Take 1 tablet (10 mg total) by mouth daily. 90 tablet 3  . glucose blood (ONETOUCH VERIO) test strip 1 each by Other route 2 (two) times daily. And lancets 2/day 200 each 12  . insulin aspart (NOVOLOG FLEXPEN) 100 UNIT/ML FlexPen 3 times a day (just before  each meal) 3-5-5 units, and pen needles 5/day 15 mL 11  . Insulin Glargine (BASAGLAR KWIKPEN) 100 UNIT/ML SOPN INJECT 16 UNITS UNDER THE SKIN EVERY MORNING, AND 18 UNITS EVERY EVENING 15 mL 2  . levothyroxine (SYNTHROID, LEVOTHROID) 75 MCG tablet TAKE 1 TABLET(75 MCG) BY MOUTH DAILY BEFORE BREAKFAST 90 tablet 3  . nitroGLYCERIN (NITROSTAT) 0.4 MG SL tablet Place 1 tablet (0.4 mg total) under the tongue every 5 (five) minutes as needed for chest pain. Chest pain 30 tablet 5  . Probiotic Product (PROBIOTIC PO) Take 1 capsule by mouth every morning.    . ramipril (ALTACE) 5 MG capsule TAKE ONE CAPSULE BY MOUTH DAILY 90 capsule 1  . rosuvastatin (CRESTOR) 40 MG tablet TAKE 1 TABLET BY MOUTH EVERY DAY. PLEASE KEEP  APPT WITH DISCARD REMAINDER. NISHAN IN [place] FOR FUTURE REFILLS. 30 tablet 10  . sacubitril-valsartan (ENTRESTO) 24-26 MG Take 1 tablet by mouth 2 (two) times daily. 60 tablet 11   No current facility-administered medications on file prior to visit.     No Known Allergies  There were no vitals taken for this visit.   Assessment/Plan:  1. CHF Medication titration- Unfortunately patients blood pressure prevents further titration of Entresto/carvedilol at this time. Could consider adding spironolactone 12.5mg  daily as that dose should not drop blood pressure to low. However, patient uninterested in adding at this time. Patient has follow up with Dr. [name] in April. Can continue to monitor for the ability to increase Entresto. Follow up with pharmacist as needed.   Thank you  Olene Floss, Pharm.D, BCPS [facility]  [address] [place], [place] [address]  Phone: [phone]; Fax: [phone]

## 2018-04-01 NOTE — Patient Instructions (Addendum)
It was nice to meet you this morning.  Continue Entresto 24/26mg  twice a day and carvedilol 6.25mg  twice a day.  Call us at [phone] with any questions or concerns  Follow up with Dr. [name] in April.

## 2018-04-02 ENCOUNTER — Telehealth: Payer: Self-pay

## 2018-04-02 NOTE — Telephone Encounter (Signed)
Received notification from Cover My meds that PA for Novolog Flexpen is not required.  DEVONTE ENDRIZZI (Key: [id number])  Rx #: [id number]  NovoLOG FlexPen 100UNIT/ML pen-injectors  Form [facility] [employer] Non-Medicare Clinical Prior Authorization Criteria General Request Form  Plan Contact [phone] phone  [phone] fax  Created  16 hours ago  Sent to Plan  1 hour ago  Determination  Unknown  13 minutes ago  Message from Plan PA not needed

## 2018-04-02 NOTE — Telephone Encounter (Signed)
PA initiated today through Cover My Meds for Novolog Flexpen. Will await insurance response re: approval/denial.  Your PA has been faxed to the plan as a paper copy. Please contact the plan directly if you haven't received a determination in a typical timeframe.  You will be notified of the determination electronically and via fax.   [name] (Key: [id number])  Rx #: [id number]  NovoLOG FlexPen 100UNIT/ML pen-injectors  Form [employer] Non-Medicare Clinical Prior Authorization Criteria General Request Form  Plan Contact [phone] phone  [phone] fax  Created  15 hours ago  Sent to Plan  1 minute ago  Determination  N/A  Wait for Determination Please wait for [employer] to return a determination.

## 2018-04-10 ENCOUNTER — Ambulatory Visit: Payer: 59 | Admitting: Cardiovascular Disease

## 2018-04-13 ENCOUNTER — Ambulatory Visit (INDEPENDENT_AMBULATORY_CARE_PROVIDER_SITE_OTHER): Payer: 59 | Admitting: *Deleted

## 2018-04-13 DIAGNOSIS — I255 Ischemic cardiomyopathy: Secondary | ICD-10-CM | POA: Diagnosis not present

## 2018-04-13 DIAGNOSIS — I5022 Chronic systolic (congestive) heart failure: Secondary | ICD-10-CM

## 2018-04-14 ENCOUNTER — Ambulatory Visit: Payer: 59

## 2018-04-14 LAB — CUP PACEART REMOTE DEVICE CHECK
Battery Remaining Longevity: 92 mo
Battery Voltage: 3 V
Brady Statistic AP VP Percent: 15.91 %
Brady Statistic AS VP Percent: 82.48 %
Brady Statistic AS VS Percent: 1.33 %
Brady Statistic RA Percent Paced: 16.14 %
Brady Statistic RV Percent Paced: 11.8 %
Date Time Interrogation Session: 20200309083824
HighPow Impedance: 60 Ohm
Implantable Lead Implant Date: 20171206
Implantable Lead Implant Date: 20171206
Implantable Lead Location: 753858
Implantable Lead Location: 753859
Implantable Lead Location: 753860
Implantable Lead Model: 4598
Implantable Lead Model: 5076
Implantable Pulse Generator Implant Date: 20171206
Lead Channel Impedance Value: 171 Ohm
Lead Channel Impedance Value: 184.154
Lead Channel Impedance Value: 184.154
Lead Channel Impedance Value: 194.634
Lead Channel Impedance Value: 342 Ohm
Lead Channel Impedance Value: 342 Ohm
Lead Channel Impedance Value: 380 Ohm
Lead Channel Impedance Value: 380 Ohm
Lead Channel Impedance Value: 380 Ohm
Lead Channel Impedance Value: 399 Ohm
Lead Channel Impedance Value: 399 Ohm
Lead Channel Impedance Value: 627 Ohm
Lead Channel Impedance Value: 665 Ohm
Lead Channel Impedance Value: 665 Ohm
Lead Channel Impedance Value: 665 Ohm
Lead Channel Pacing Threshold Amplitude: 0.375 V
Lead Channel Pacing Threshold Amplitude: 0.875 V
Lead Channel Pacing Threshold Pulse Width: 0.4 ms
Lead Channel Pacing Threshold Pulse Width: 0.4 ms
Lead Channel Pacing Threshold Pulse Width: 0.4 ms
Lead Channel Sensing Intrinsic Amplitude: 15.125 mV
Lead Channel Sensing Intrinsic Amplitude: 2.75 mV
Lead Channel Sensing Intrinsic Amplitude: 2.75 mV
Lead Channel Setting Pacing Amplitude: 2 V
Lead Channel Setting Pacing Amplitude: 2.5 V
Lead Channel Setting Pacing Pulse Width: 0.4 ms
Lead Channel Setting Sensing Sensitivity: 0.3 mV
MDC IDC LEAD IMPLANT DT: 20171206
MDC IDC MSMT LEADCHNL LV IMPEDANCE VALUE: 180 Ohm
MDC IDC MSMT LEADCHNL LV IMPEDANCE VALUE: 627 Ohm
MDC IDC MSMT LEADCHNL LV PACING THRESHOLD AMPLITUDE: 1.125 V
MDC IDC MSMT LEADCHNL RV IMPEDANCE VALUE: 304 Ohm
MDC IDC MSMT LEADCHNL RV SENSING INTR AMPL: 15.125 mV
MDC IDC SET LEADCHNL LV PACING AMPLITUDE: 1.75 V
MDC IDC SET LEADCHNL LV PACING PULSEWIDTH: 0.4 ms
MDC IDC STAT BRADY AP VS PERCENT: 0.27 %

## 2018-04-21 NOTE — Progress Notes (Signed)
Remote ICD transmission.   

## 2018-04-24 ENCOUNTER — Ambulatory Visit: Payer: 59 | Admitting: Endocrinology

## 2018-04-28 ENCOUNTER — Other Ambulatory Visit: Payer: Self-pay

## 2018-04-28 ENCOUNTER — Ambulatory Visit (INDEPENDENT_AMBULATORY_CARE_PROVIDER_SITE_OTHER): Payer: 59

## 2018-04-28 DIAGNOSIS — Z23 Encounter for immunization: Secondary | ICD-10-CM

## 2018-04-28 NOTE — Progress Notes (Signed)
Per orders of Dr. Durene Cal, injection of Shingrix given by Alessandra Bevels Zellmer, CMA  in left deltoid. Patient tolerated injection well.

## 2018-05-20 NOTE — Progress Notes (Signed)
Virtual Visit via Video Note   This visit type was conducted due to national recommendations for restrictions regarding the COVID-19 Pandemic (e.g. social distancing) in an effort to limit this patient's exposure and mitigate transmission in our community.  Due to his co-morbid illnesses, this patient is at least at moderate risk for complications without adequate follow up.  This format is felt to be most appropriate for this patient at this time.  All issues noted in this document were discussed and addressed.  A limited physical exam was performed with this format.  Please refer to the patient's chart for his consent to telehealth for [facility].   Evaluation Performed:  Follow-up visit  Date:  05/22/2018   ID:  John Carrillo, DOB 08-May-1950, MRN [id number]  Patient Location: Home Provider Location: Office  PCP:  [name], MD  Cardiologist:  [name] Electrophysiologist:  None   Chief Complaint:  Ischemic Cardiomyopathy  History of Present Illness:    John Carrillo is a 68 y.o. male with a hx of CAD status post anterior MI 18 years ago treated with stenting of the LAD, ICM, LBBB, prior CVA, HL, DM 2.  He was evaluated by Dr. [name] in 2008 for ICD implantation for primary prevention.  He declined at that time.  Had SEMI 2016 with RCA intervention EF 15% Readmitted 01/23/17 with right occipital lobe CVA placed on Brillinta and Eliquis. Eventually agreed to MDT CRTD device implant Last TTE done 08/15/17 EF improved to 30-35% started on entresto January and due to low BP only tolerating coreg 6.25 bid and entresto 24-26 mg  Daughter [name] is also a Printmaker at [employer] oldest daughter is in [place] working for [employer] and traveling to [place]   No CHF symptoms Discussed high A1c and need to cut back on cookies  The patient does not have symptoms concerning for COVID-19 infection (fever, chills, cough, or new shortness of breath).    Past Medical History:  Diagnosis Date  . Acute kidney injury superimposed on chronic kidney disease (HCC) 01/25/2015  . CORONARY ARTERY DISEASE 08/11/2006  . CVA 12/19/2006   2008  . DIABETES MELLITUS, TYPE I 08/11/2006  . ECZEMA, HANDS 12/15/2009  . HYPERLIPIDEMIA 08/11/2006  . HYPERTENSION 08/11/2006   Past Surgical History:  Procedure Laterality Date  . CARDIAC CATHETERIZATION N/A 01/16/2015   Procedure: Left Heart Cath and Coronary Angiography;  Surgeon: [name], MD;  Location[facility] INVASIVE CV LAB;  Service: Cardiovascular;  Laterality: N/A;  . CORONARY STENT PLACEMENT    . EP IMPLANTABLE DEVICE N/A 01/10/2016   MDT Claria MRI Quad CRTD implanted by Dr [name]  . TEE WITHOUT CARDIOVERSION N/A 01/26/2015   Procedure: TRANSESOPHAGEAL ECHOCARDIOGRAM (TEE);  Surgeon: [name], MD;  Location: [facility] ENDOSCOPY;  Service: Cardiovascular;  Laterality: N/A;  . TONSILECTOMY, ADENOIDECTOMY, BILATERAL MYRINGOTOMY AND TUBES       Current Meds  Medication Sig  . acetaminophen (TYLENOL) 325 MG tablet Take 2 tablets (650 mg total) by mouth every 4 (four) hours as needed for headache or mild pain.  . carvedilol (COREG) 6.25 MG tablet TAKE 1 TABLET BY MOUTH TWICE DAILY  . clopidogrel (PLAVIX) 75 MG tablet TAKE 1 TABLET(75 MG) BY MOUTH DAILY  . Coenzyme Q10 60 MG CAPS Take 1 capsule by mouth daily.   [name] ezetimibe (ZETIA) 10 MG tablet Take 1 tablet (10 mg total) by mouth daily.  [name] glucose blood (ONETOUCH VERIO) test strip 1 each  by Other route 2 (two) times daily. And lancets 2/day  . Insulin Glargine (BASAGLAR KWIKPEN) 100 UNIT/ML SOPN INJECT 16 UNITS UNDER THE SKIN EVERY MORNING, AND 18 UNITS EVERY EVENING  . levothyroxine (SYNTHROID, LEVOTHROID) 75 MCG tablet TAKE 1 TABLET(75 MCG) BY MOUTH DAILY BEFORE BREAKFAST  . nitroGLYCERIN (NITROSTAT) 0.4 MG SL tablet Place 1 tablet (0.4 mg total) under the tongue every 5 (five) minutes as needed for chest pain. Chest pain  . NOVOLOG FLEXPEN 100 UNIT/ML  FlexPen INJECT 6 UNITS UNDER THE SKIN THREE TIMES DAILY WITH MEALS  . Probiotic Product (PROBIOTIC PO) Take 1 capsule by mouth every morning.  . rosuvastatin (CRESTOR) 40 MG tablet TAKE 1 TABLET BY MOUTH EVERY DAY. PLEASE KEEP APPT WITH DISCARD REMAINDER. John Carrillo IN [place] FOR FUTURE REFILLS.  [name] sacubitril-valsartan (ENTRESTO) 24-26 MG Take 1 tablet by mouth 2 (two) times daily.     Allergies:   Patient has no known allergies.   Social History   Tobacco Use  . Smoking status: Former Smoker    Packs/day: 1.00    Years: 30.00    Pack years: 30.00    Types: Cigarettes    Last attempt to quit: 05/03/2000    Years since quitting: 18.0  . Smokeless tobacco: Never Used  Substance Use Topics  . Alcohol use: Yes    Alcohol/week: 3.0 standard drinks    Types: 1 Glasses of wine, 1 Cans of beer, 1 Shots of liquor per week    Comment: 1-2/month  . Drug use: No     Family Hx: The patient's family history includes Brain cancer in his maternal grandmother; CAD in his father, paternal grandfather, and paternal grandmother; Celiac disease in his sister; Dementia in his mother; Lymphoma in his sister; Other in his mother. There is no history of Diabetes.  ROS:   Please see the history of present illness.     All other systems reviewed and are negative.   Prior CV studies:   The following studies were reviewed today:  PaceArt 04/13/18 Echo 08/15/17   Labs/Other Tests and Data Reviewed:    EKG:  AV dual pacing QRS 188 msec 02/27/18  Recent Labs: 02/13/2018: ALT 63; Hemoglobin 14.7; Platelets 174.0; TSH 4.27 04/01/2018: BUN 19; Creatinine, Ser 1.28; Potassium 4.3; Sodium 141   Recent Lipid Panel Lab Results  Component Value Date/Time   CHOL 123 02/13/2018 09:03 AM   TRIG 61.0 02/13/2018 09:03 AM   HDL 53.10 02/13/2018 09:03 AM   CHOLHDL 2 02/13/2018 09:03 AM   LDLCALC 57 02/13/2018 09:03 AM   LDLDIRECT 85.0 07/11/2017 08:39 AM    Wt Readings from Last 3 Encounters:  05/22/18 80.3 kg   02/27/18 78.8 kg  02/13/18 79.7 kg     Objective:    Vital Signs:  Ht  (1.727 m)   Wt 80.3 kg   BMI 26.91 kg/m    Well nourished, well developed male in no acute distress. CRT device under left clavicle Skin warm and dry No tachypnea No JVP elevation No edema   ASSESSMENT & PLAN:    1. CHF:  EF 30-35% functional class one continue current meds titration limited by low BP 2. CAD:  Post large anterior MI with stenting of LAD 18 years ago and SEMI stenting ?RCA in 2016 no angina continue medical Rx 3. CRT-AICD:  F/u EP his QRS seems a bit wide will forward note to EP no d/c normal PaceArt review 4. HLD:  Continue crestor and zetia LDL 85  5. DM:  Discussed low carb diet.  Target hemoglobin A1c is 6.5 or less.  Continue current medications. 6. Thyroid:  On replacement TSH normal   COVID-19 Education: The signs and symptoms of COVID-19 were discussed with the patient and how to seek care for testing (follow up with PCP or arrange E-visit).  The importance of social distancing was discussed today.  Time:   Today, I have spent 30 minutes with the patient with telehealth technology discussing the above problems.     Medication Adjustments/Labs and Tests Ordered: Current medicines are reviewed at length with the patient today.  Concerns regarding medicines are outlined above.   Tests Ordered: No orders of the defined types were placed in this encounter.   Medication Changes: No orders of the defined types were placed in this encounter.   Disposition:  Follow up in a year   Signed, Charlton Hawseter Nishan, MD  05/22/2018 7:47 AM    [facility]

## 2018-05-22 ENCOUNTER — Encounter: Payer: Self-pay | Admitting: Cardiovascular Disease

## 2018-05-22 ENCOUNTER — Other Ambulatory Visit: Payer: Self-pay

## 2018-05-22 ENCOUNTER — Telehealth (INDEPENDENT_AMBULATORY_CARE_PROVIDER_SITE_OTHER): Payer: 59 | Admitting: Cardiovascular Disease

## 2018-05-22 VITALS — BP 118/78 | Ht 68.0 in | Wt 177.0 lb

## 2018-05-22 DIAGNOSIS — I255 Ischemic cardiomyopathy: Secondary | ICD-10-CM | POA: Diagnosis not present

## 2018-05-22 NOTE — Patient Instructions (Addendum)

## 2018-06-05 ENCOUNTER — Ambulatory Visit (INDEPENDENT_AMBULATORY_CARE_PROVIDER_SITE_OTHER): Payer: 59

## 2018-06-05 ENCOUNTER — Ambulatory Visit (INDEPENDENT_AMBULATORY_CARE_PROVIDER_SITE_OTHER): Payer: 59 | Admitting: Endocrinology

## 2018-06-05 ENCOUNTER — Other Ambulatory Visit: Payer: Self-pay

## 2018-06-05 ENCOUNTER — Ambulatory Visit: Payer: 59 | Admitting: Endocrinology

## 2018-06-05 DIAGNOSIS — Z794 Long term (current) use of insulin: Secondary | ICD-10-CM | POA: Diagnosis not present

## 2018-06-05 DIAGNOSIS — E1122 Type 2 diabetes mellitus with diabetic chronic kidney disease: Secondary | ICD-10-CM

## 2018-06-05 DIAGNOSIS — E1059 Type 1 diabetes mellitus with other circulatory complications: Secondary | ICD-10-CM | POA: Diagnosis not present

## 2018-06-05 DIAGNOSIS — N183 Chronic kidney disease, stage 3 unspecified: Secondary | ICD-10-CM

## 2018-06-05 DIAGNOSIS — E1051 Type 1 diabetes mellitus with diabetic peripheral angiopathy without gangrene: Secondary | ICD-10-CM | POA: Diagnosis not present

## 2018-06-05 DIAGNOSIS — E10649 Type 1 diabetes mellitus with hypoglycemia without coma: Secondary | ICD-10-CM

## 2018-06-05 LAB — POCT GLYCOSYLATED HEMOGLOBIN (HGB A1C): Hemoglobin A1C: 8.9 % — AB (ref 4.0–5.6)

## 2018-06-05 NOTE — Patient Instructions (Addendum)
Please come in to have the a1c checked.   check your blood sugar twice a day.  vary the time of day when you check, between before the 3 meals, and at bedtime.  also check if you have symptoms of your blood sugar being too high or too low.  please keep a record of the readings and bring it to your next appointment here (or you can bring the meter itself).  You can write it on any piece of paper.  please call us sooner if your blood sugar goes below 70, or if you have a lot of readings over 200.   Please come back for a follow-up appointment in 2 months.

## 2018-06-05 NOTE — Progress Notes (Signed)
Pt presents today for nurse visit for completion of A1C. 

## 2018-06-05 NOTE — Progress Notes (Signed)
Subjective:    Patient ID: John Carrillo, male    DOB: 1950-11-07, 68 y.o.   MRN: [id number]  HPI  telehealth visit today via doxy video visit.  Alternatives to telehealth are presented to this patient, and the patient agrees to the telehealth visit. Pt is advised of the cost of the visit, and agrees to this, also.   Patient is at home, and I am at the office.   Persons attending the telehealth visit: the patient and I.  Pt returns for f/u of diabetes mellitus:  DM type: 1 Dx'ed: 2008. Complications: CVA, PAD, renal insufficiency, and CAD.   Therapy: insulin since soon after dx.  DKA: never.  Severe hypoglycemia: never.  Pancreatitis: never.   Other: he declines pump, including V-GO; he takes multiple daily injections, but basal insulin is emphasized, after poor results with usual multiple daily injections regimen; He says his ability to care for his DM has been compromised by wife's illness.   Interval history:  Pt says he seldom misses the insulin.  pt states cbg's are in the low to mid-100's. It is still highest fasting.  pt states he feels well in general.  He seldom has hypoglycemia, and these episodes are mild.  This happens when a meal is delayed.   Past Medical History:  Diagnosis Date  . Acute kidney injury superimposed on chronic kidney disease (HCC) 01/25/2015  . CORONARY ARTERY DISEASE 08/11/2006  . CVA 12/19/2006   2008  . DIABETES MELLITUS, TYPE I 08/11/2006  . ECZEMA, HANDS 12/15/2009  . HYPERLIPIDEMIA 08/11/2006  . HYPERTENSION 08/11/2006    Past Surgical History:  Procedure Laterality Date  . CARDIAC CATHETERIZATION N/A 01/16/2015   Procedure: Left Heart Cath and Coronary Angiography;  Surgeon: [name], MD;  Location: [facility] INVASIVE CV LAB;  Service: Cardiovascular;  Laterality: N/A;  . CORONARY STENT PLACEMENT    . EP IMPLANTABLE DEVICE N/A 01/10/2016   MDT Claria MRI Quad CRTD implanted by Dr [name]  . TEE WITHOUT CARDIOVERSION N/A 01/26/2015   Procedure:  TRANSESOPHAGEAL ECHOCARDIOGRAM (TEE);  Surgeon: [name], MD;  Location: [facility] ENDOSCOPY;  Service: Cardiovascular;  Laterality: N/A;  . TONSILECTOMY, ADENOIDECTOMY, BILATERAL MYRINGOTOMY AND TUBES      Social History   Socioeconomic History  . Marital status: Married    Spouse name: Not on file  . Number of children: Not on file  . Years of education: Not on file  . Highest education level: Not on file  Occupational History  . Occupation: Engineer, maintenance: [employer]  Social Needs  . Financial resource strain: Not on file  . Food insecurity:    Worry: Not on file    Inability: Not on file  . Transportation needs:    Medical: Not on file    Non-medical: Not on file  Tobacco Use  . Smoking status: Former Smoker    Packs/day: 1.00    Years: 30.00    Pack years: 30.00    Types: Cigarettes    Last attempt to quit: 05/03/2000    Years since quitting: 18.1  . Smokeless tobacco: Never Used  Substance and Sexual Activity  . Alcohol use: Yes    Alcohol/week: 3.0 standard drinks    Types: 1 Glasses of wine, 1 Cans of beer, 1 Shots of liquor per week    Comment: 1-2/month  . Drug use: No  . Sexual activity: Not on file  Lifestyle  . Physical activity:    Days  per week: Not on file    Minutes per session: Not on file  . Stress: Not on file  Relationships  . Social connections:    Talks on phone: Not on file    Gets together: Not on file    Attends religious service: Not on file    Active member of club or organization: Not on file    Attends meetings of clubs or organizations: Not on file    Relationship status: Not on file  . Intimate partner violence:    Fear of current or ex partner: Not on file    Emotionally abused: Not on file    Physically abused: Not on file    Forced sexual activity: Not on file  Other Topics Concern  . Not on file  Social History Narrative   Married (kids are patients here, wife seen elsewhere). 3 step children, 1  biological. No grandkids.    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 acetaminophen (TYLENOL) 325 MG tablet Take 2 tablets (650 mg total) by mouth every 4 (four) hours as needed for headache or mild pain.    . carvedilol (COREG) 6.25 MG tablet TAKE 1 TABLET BY MOUTH TWICE DAILY 60 tablet 8  . clopidogrel (PLAVIX) 75 MG tablet TAKE 1 TABLET(75 MG) BY MOUTH DAILY 90 tablet 2  . Coenzyme Q10 60 MG CAPS Take 1 capsule by mouth daily.     [name] ezetimibe (ZETIA) 10 MG tablet Take 1 tablet (10 mg total) by mouth daily. 90 tablet 3  . glucose blood (ONETOUCH VERIO) test strip 1 each by Other route 2 (two) times daily. And lancets 2/day 200 each 12  . Insulin Glargine (BASAGLAR KWIKPEN) 100 UNIT/ML SOPN INJECT 16 UNITS UNDER THE SKIN EVERY MORNING, AND 18 UNITS EVERY EVENING 15 mL 2  . levothyroxine (SYNTHROID, LEVOTHROID) 75 MCG tablet TAKE 1 TABLET(75 MCG) BY MOUTH DAILY BEFORE BREAKFAST 90 tablet 3  . nitroGLYCERIN (NITROSTAT) 0.4 MG SL tablet Place 1 tablet (0.4 mg total) under the tongue every 5 (five) minutes as needed for chest pain. Chest pain 30 tablet 5  . NOVOLOG FLEXPEN 100 UNIT/ML FlexPen INJECT 6 UNITS UNDER THE SKIN THREE TIMES DAILY WITH MEALS 15 mL 11  . Probiotic Product (PROBIOTIC PO) Take 1 capsule by mouth every morning.    . rosuvastatin (CRESTOR) 40 MG tablet TAKE 1 TABLET BY MOUTH EVERY DAY. PLEASE KEEP APPT WITH DISCARD REMAINDER. NISHAN IN [place] FOR FUTURE REFILLS. 30 tablet 10  . sacubitril-valsartan (ENTRESTO) 24-26 MG  Take 1 tablet by mouth 2 (two) times daily. 60 tablet 11   No current facility-administered medications on file prior to visit.     No Known Allergies  Family History  Problem Relation Age of Onset  . CAD Father   . Other Mother        passed right before 4- tumor on kidney recent diagnosis  . Dementia Mother        progressing rapidly  .  CAD Paternal Grandfather        9070s  . CAD Paternal Grandmother        1270s  . Brain cancer Maternal Grandmother   . Lymphoma Sister        b cell  . Celiac disease Sister   . Diabetes Neg Hx     Review of Systems he denies LOC.      Objective:   Physical Exam   Lab Results  Component Value Date   HGBA1C 8.9 (A) 06/05/2018   Lab Results  Component Value Date   CREATININE 1.28 (H) 04/01/2018   BUN 19 04/01/2018   NA 141 04/01/2018   K 4.3 04/01/2018   CL 99 04/01/2018   CO2 25 04/01/2018      Assessment & Plan:  Type 1 DM, with PAD: he needs increased rx Hypoglycemia: This limits aggressiveness of glycemic control CAD: in this setting, he should avoid hypoglycemia.  Patient Instructions  Please come in to have the a1c checked.   check your blood sugar twice a day.  vary the time of day when you check, between before the 3 meals, and at bedtime.  also check if you have symptoms of your blood sugar being too high or too low.  please keep a record of the readings and bring it to your next appointment here (or you can bring the meter itself).  You can write it on any piece of paper.  please call us sooner if your blood sugar goes below 70, or if you have a lot of readings over 200.   Please come back for a follow-up appointment in 2 months.

## 2018-06-15 ENCOUNTER — Other Ambulatory Visit: Payer: Self-pay | Admitting: Family Medicine

## 2018-07-03 IMAGING — DX DG CHEST 2V
2 series · 2 of 2 positions shown · non-contrast
Comparison: Prior radiograph from 01/17/2015.

CLINICAL DATA: Initial evaluation for pacemaker placement.

EXAM:
CHEST  2 VIEW

[chest pa]
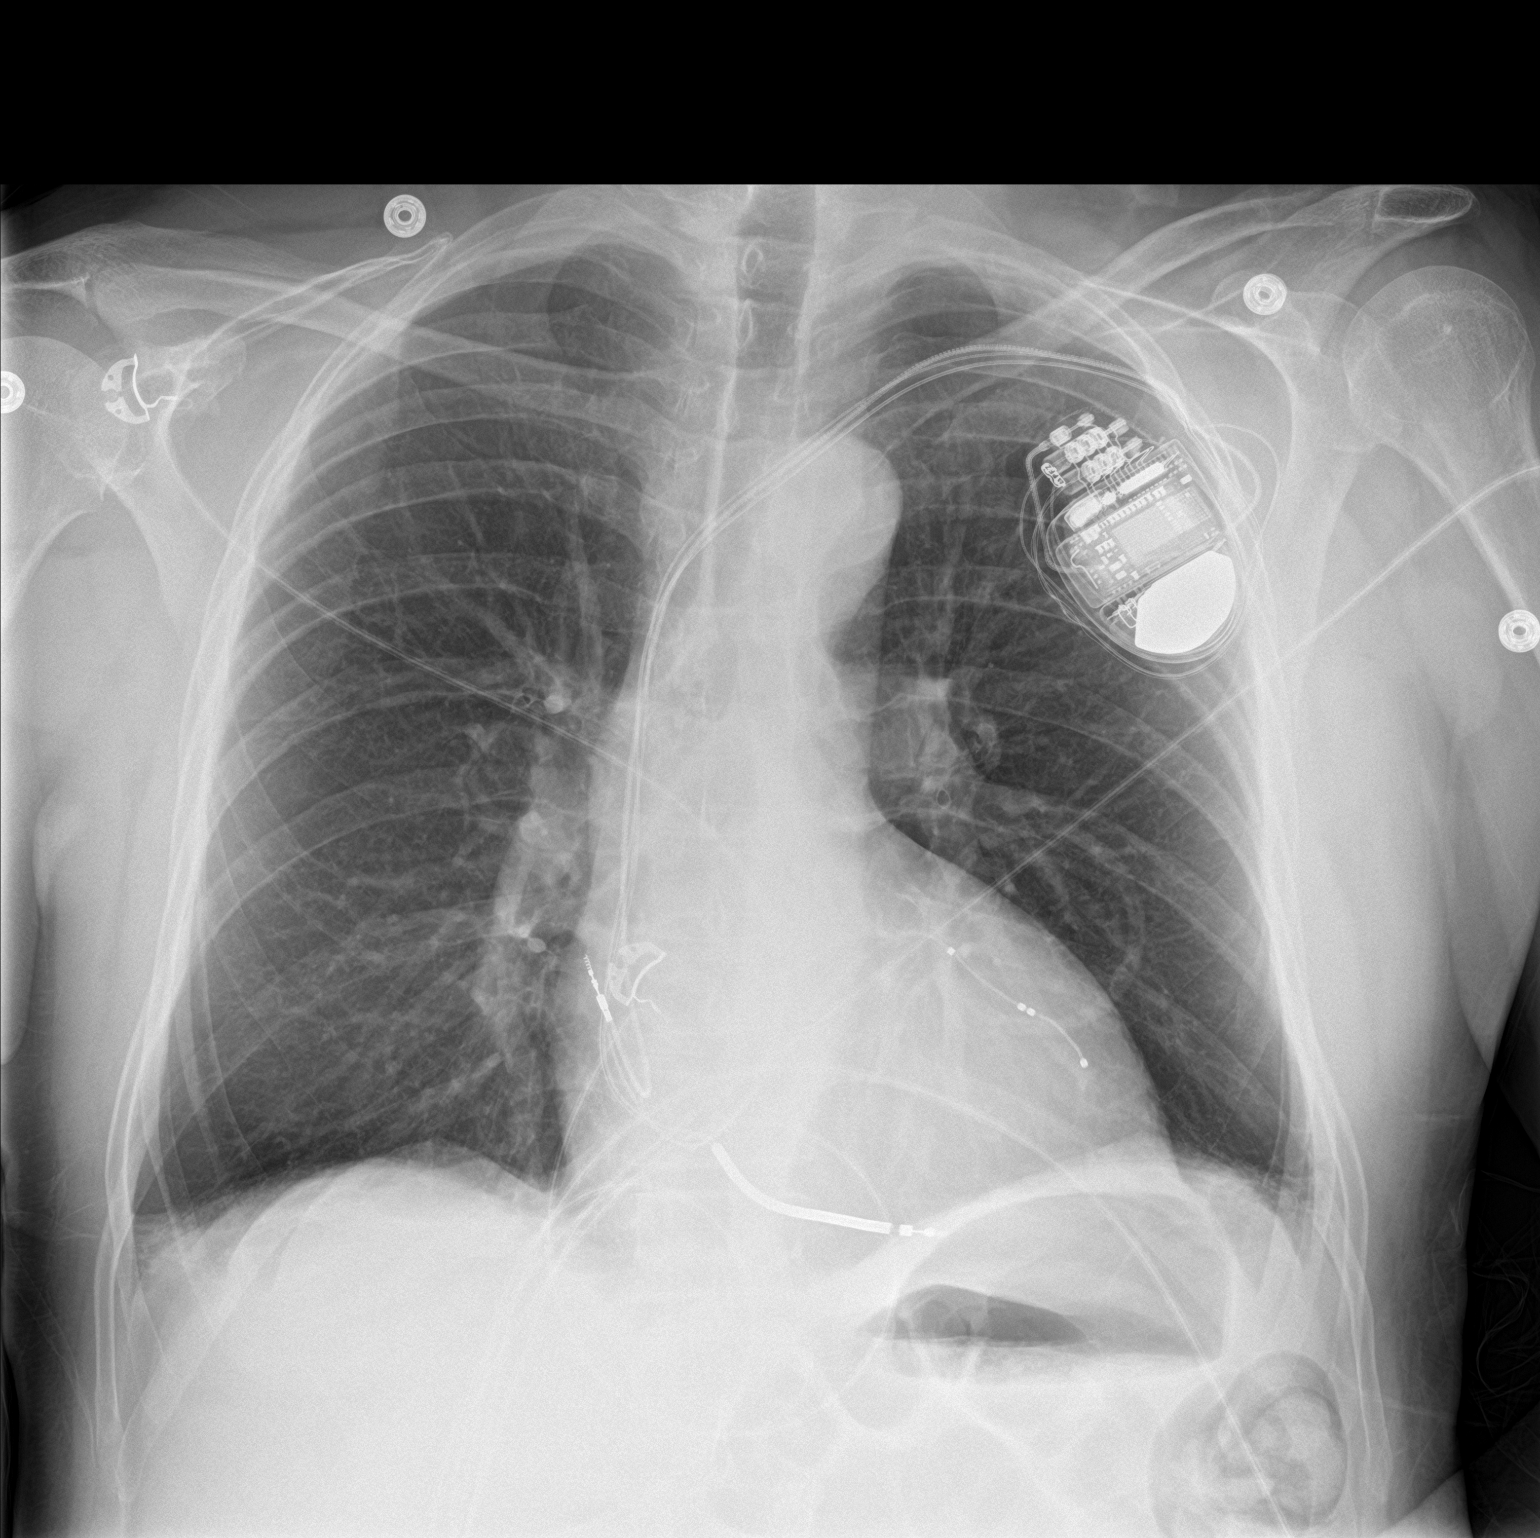

[chest lat]
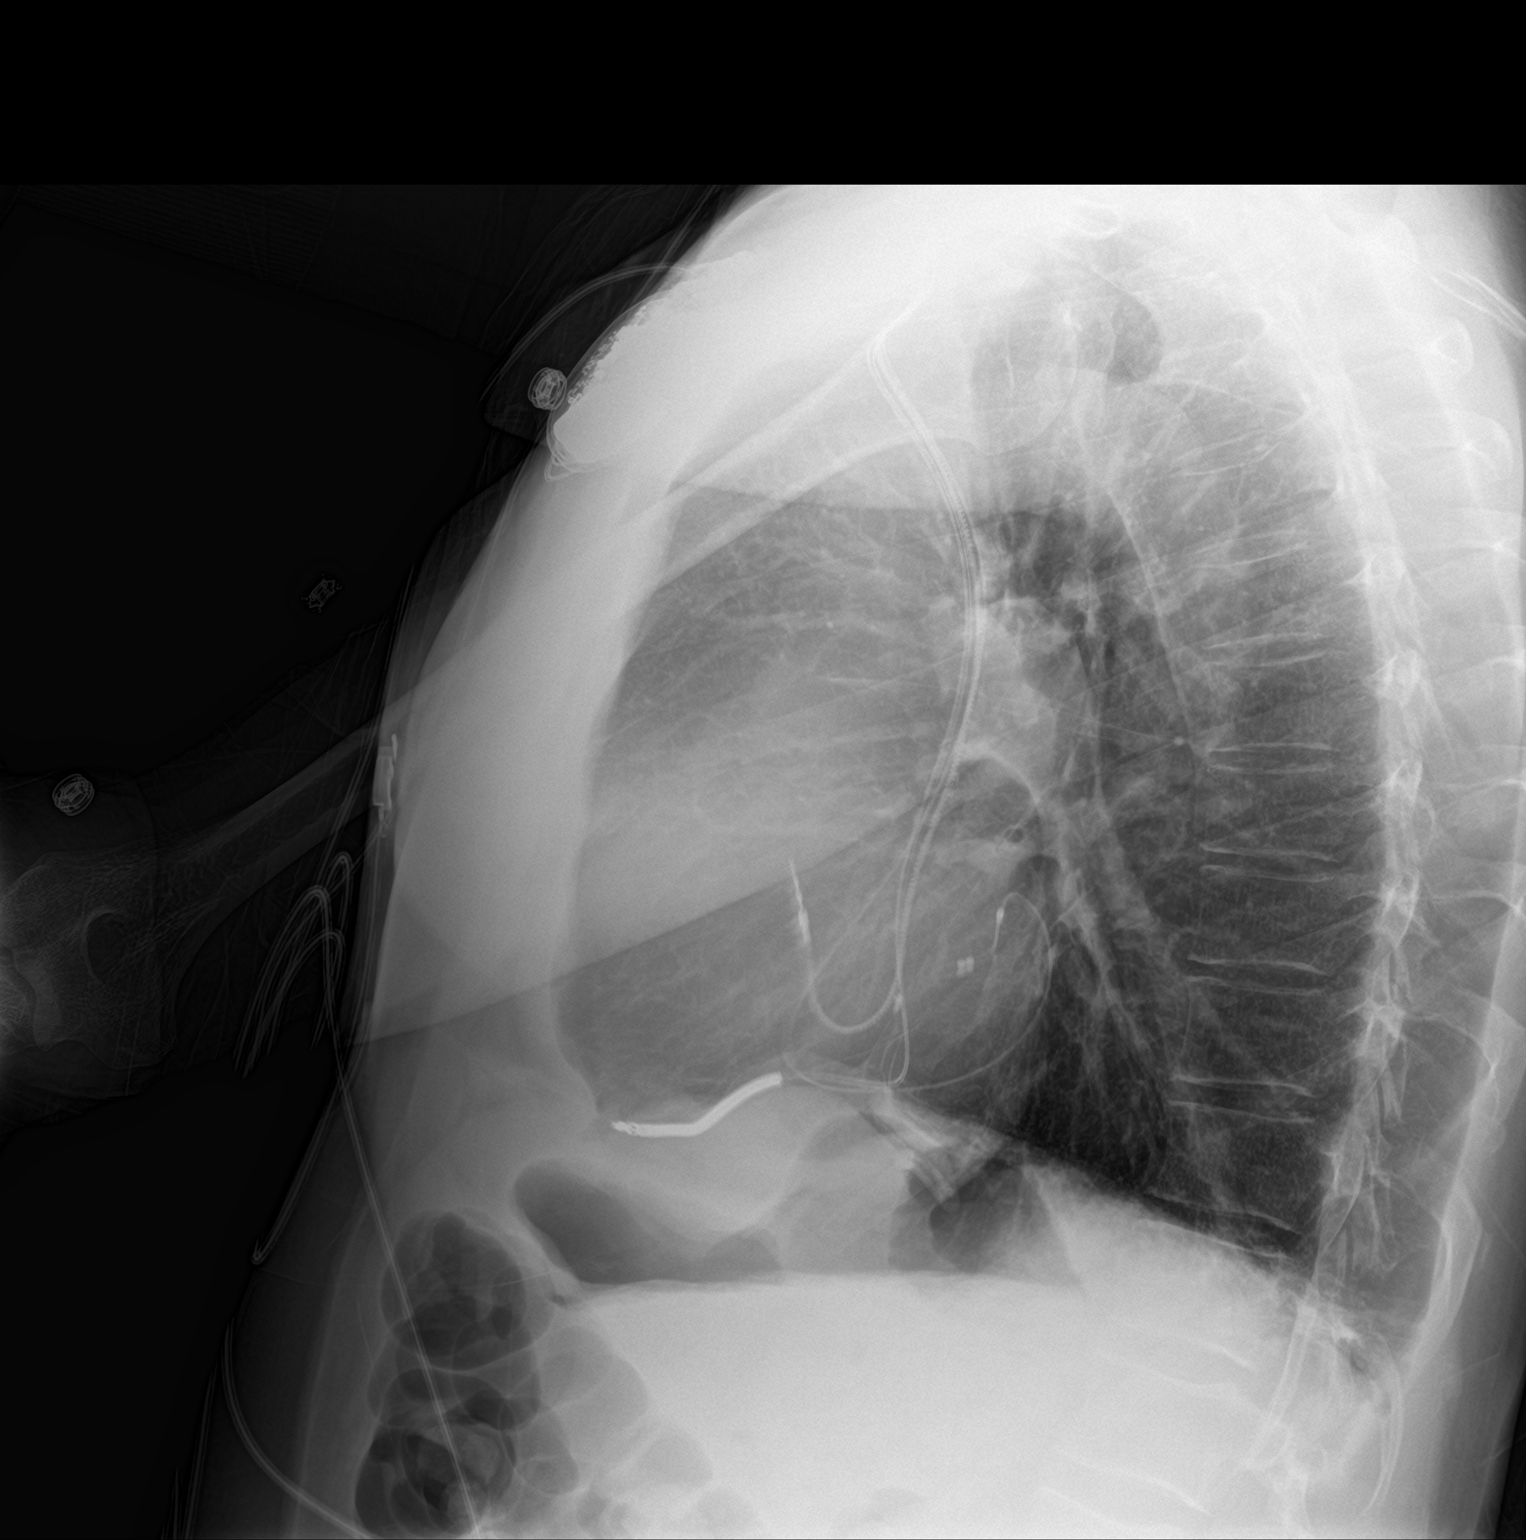

[2 of 2 positions shown; findings below may reference images not displayed]

FINDINGS: There has been interval placement of a left-sided transvenous
pacemaker/ AICD. Generator and pacemaker electrodes are intact. No
complication.

Mild cardiomegaly, stable. Mediastinal silhouette within normal
limits.

Lungs are normally inflated. Mild bibasilar atelectatic changes. No
focal infiltrates. No pulmonary edema or pleural effusion. No
pneumothorax.

No acute osseous abnormality.
IMPRESSION: 1. Interval placement of left-sided transvenous pacemaker/AICD
without complication.
2. Mild bibasilar atelectasis. No other active cardiopulmonary
disease. No pneumothorax.

## 2018-07-13 ENCOUNTER — Ambulatory Visit (INDEPENDENT_AMBULATORY_CARE_PROVIDER_SITE_OTHER): Payer: 59 | Admitting: *Deleted

## 2018-07-13 DIAGNOSIS — I255 Ischemic cardiomyopathy: Secondary | ICD-10-CM | POA: Diagnosis not present

## 2018-07-13 DIAGNOSIS — I5022 Chronic systolic (congestive) heart failure: Secondary | ICD-10-CM

## 2018-07-13 LAB — CUP PACEART REMOTE DEVICE CHECK
Battery Remaining Longevity: 90 mo
Battery Voltage: 2.99 V
Brady Statistic AP VP Percent: 43.14 %
Brady Statistic AP VS Percent: 0.67 %
Brady Statistic AS VP Percent: 55.32 %
Brady Statistic AS VS Percent: 0.87 %
Brady Statistic RA Percent Paced: 43.67 %
Brady Statistic RV Percent Paced: 42.74 %
Date Time Interrogation Session: 20200608062504
HighPow Impedance: 62 Ohm
Implantable Lead Implant Date: 20171206
Implantable Lead Implant Date: 20171206
Implantable Lead Implant Date: 20171206
Implantable Lead Location: 753858
Implantable Lead Location: 753859
Implantable Lead Location: 753860
Implantable Lead Model: 4598
Implantable Lead Model: 5076
Implantable Pulse Generator Implant Date: 20171206
Lead Channel Impedance Value: 194.634
Lead Channel Impedance Value: 203.256
Lead Channel Impedance Value: 208.568
Lead Channel Impedance Value: 208.568
Lead Channel Impedance Value: 218.5 Ohm
Lead Channel Impedance Value: 304 Ohm
Lead Channel Impedance Value: 380 Ohm
Lead Channel Impedance Value: 380 Ohm
Lead Channel Impedance Value: 399 Ohm
Lead Channel Impedance Value: 399 Ohm
Lead Channel Impedance Value: 437 Ohm
Lead Channel Impedance Value: 437 Ohm
Lead Channel Impedance Value: 437 Ohm
Lead Channel Impedance Value: 646 Ohm
Lead Channel Impedance Value: 703 Ohm
Lead Channel Impedance Value: 722 Ohm
Lead Channel Impedance Value: 722 Ohm
Lead Channel Impedance Value: 779 Ohm
Lead Channel Pacing Threshold Amplitude: 0.375 V
Lead Channel Pacing Threshold Amplitude: 0.75 V
Lead Channel Pacing Threshold Amplitude: 1 V
Lead Channel Pacing Threshold Pulse Width: 0.4 ms
Lead Channel Pacing Threshold Pulse Width: 0.4 ms
Lead Channel Pacing Threshold Pulse Width: 0.4 ms
Lead Channel Sensing Intrinsic Amplitude: 14.125 mV
Lead Channel Sensing Intrinsic Amplitude: 14.125 mV
Lead Channel Sensing Intrinsic Amplitude: 2.5 mV
Lead Channel Sensing Intrinsic Amplitude: 2.5 mV
Lead Channel Setting Pacing Amplitude: 1.5 V
Lead Channel Setting Pacing Amplitude: 2 V
Lead Channel Setting Pacing Amplitude: 2.5 V
Lead Channel Setting Pacing Pulse Width: 0.4 ms
Lead Channel Setting Pacing Pulse Width: 0.4 ms
Lead Channel Setting Sensing Sensitivity: 0.3 mV

## 2018-07-21 NOTE — Progress Notes (Signed)
Remote ICD transmission.   

## 2018-08-13 ENCOUNTER — Other Ambulatory Visit: Payer: Self-pay

## 2018-08-17 ENCOUNTER — Encounter: Payer: Self-pay | Admitting: Endocrinology

## 2018-08-17 ENCOUNTER — Other Ambulatory Visit: Payer: Self-pay

## 2018-08-17 ENCOUNTER — Ambulatory Visit (INDEPENDENT_AMBULATORY_CARE_PROVIDER_SITE_OTHER): Payer: 59 | Admitting: Endocrinology

## 2018-08-17 VITALS — BP 104/60 | HR 58 | Ht 68.0 in | Wt 185.8 lb

## 2018-08-17 DIAGNOSIS — E1022 Type 1 diabetes mellitus with diabetic chronic kidney disease: Secondary | ICD-10-CM

## 2018-08-17 DIAGNOSIS — N183 Chronic kidney disease, stage 3 unspecified: Secondary | ICD-10-CM

## 2018-08-17 DIAGNOSIS — E1122 Type 2 diabetes mellitus with diabetic chronic kidney disease: Secondary | ICD-10-CM

## 2018-08-17 DIAGNOSIS — Z794 Long term (current) use of insulin: Secondary | ICD-10-CM

## 2018-08-17 DIAGNOSIS — E1051 Type 1 diabetes mellitus with diabetic peripheral angiopathy without gangrene: Secondary | ICD-10-CM | POA: Diagnosis not present

## 2018-08-17 LAB — POCT GLYCOSYLATED HEMOGLOBIN (HGB A1C): Hemoglobin A1C: 8.3 % — AB (ref 4.0–5.6)

## 2018-08-17 MED ORDER — BASAGLAR KWIKPEN 100 UNIT/ML ~~LOC~~ SOPN: 40.0000 [IU] | PEN_INJECTOR | SUBCUTANEOUS | 2 refills | Status: DC

## 2018-08-17 NOTE — Patient Instructions (Addendum)
Please increase the Basaglar to 40 units each morning, and: Please continue the same Novolog. Please let us know if you want to consider the "V-GO" disposable pump. check your blood sugar twice a day.  vary the time of day when you check, between before the 3 meals, and at bedtime.  also check if you have symptoms of your blood sugar being too high or too low.  please keep a record of the readings and bring it to your next appointment here (or you can bring the meter itself).  You can write it on any piece of paper.  please call us sooner if your blood sugar goes below 70, or if you have a lot of readings over 200.   Please come back for a follow-up appointment in 2 months.

## 2018-08-17 NOTE — Progress Notes (Addendum)
Subjective:    Patient ID: John Carrillo, male    DOB: April 14, 1950, 68 y.o.   MRN: [id number]  HPI Pt returns for f/u of diabetes mellitus:  DM type: 1 Dx'ed: 2008. Complications: CVA, PAD, renal insufficiency, and CAD.   Therapy: insulin since soon after dx.  DKA: never.  Severe hypoglycemia: never.   Pancreatitis: never.   Other: he declines pump, including V-GO; he takes multiple daily injections, but basal insulin is emphasized, after poor results with usual multiple daily injections regimen; He says his ability to care for his DM has been compromised by wife's illness.   Interval history:  Pt says he seldom misses the insulin.  pt states cbg's vary from 75-200's.  pt states he feels well in general.  He seldom has hypoglycemia, and these episodes are mild.  This happens with activity.  He averages approx 6 units of novolog 3 times a day (just before each meal).    Past Medical History:  Diagnosis Date  . Acute kidney injury superimposed on chronic kidney disease ([place]) 01/25/2015  . CORONARY ARTERY DISEASE 08/11/2006  . CVA 12/19/2006   2008  . DIABETES MELLITUS, TYPE I 08/11/2006  . ECZEMA, HANDS 12/15/2009  . HYPERLIPIDEMIA 08/11/2006  . HYPERTENSION 08/11/2006    Past Surgical History:  Procedure Laterality Date  . CARDIAC CATHETERIZATION N/A 01/16/2015   Procedure: Left Heart Cath and Coronary Angiography;  Surgeon: Lorretta Harp, MD;  Location: [facility];  Service: Cardiovascular;  Laterality: N/A;  . CORONARY STENT PLACEMENT    . EP IMPLANTABLE DEVICE N/A 01/10/2016   MDT Claria MRI Quad CRTD implanted by Dr Rayann Heman  . TEE WITHOUT CARDIOVERSION N/A 01/26/2015   Procedure: TRANSESOPHAGEAL ECHOCARDIOGRAM (TEE);  Surgeon: Sanda Klein, MD;  Location: [facility]OSCOPY;  Service: Cardiovascular;  Laterality: N/A;  . TONSILECTOMY, ADENOIDECTOMY, BILATERAL MYRINGOTOMY AND TUBES      Social History   Socioeconomic History  . Marital status: Married    Spouse name: Not  on file  . Number of children: Not on file  . Years of education: Not on file  . Highest education level: Not on file  Occupational History  . Occupation: Software engineer: [employer]  . Financial resource strain: Not on file  . Food insecurity    Worry: Not on file    Inability: Not on file  . Transportation needs    Medical: Not on file    Non-medical: Not on file  Tobacco Use  . Smoking status: Former Smoker    Packs/day: 1.00    Years: 30.00    Pack years: 30.00    Types: Cigarettes    Quit date: 05/03/2000    Years since quitting: 18.3  . Smokeless tobacco: Never Used  Substance and Sexual Activity  . Alcohol use: Yes    Alcohol/week: 3.0 standard drinks    Types: 1 Glasses of wine, 1 Cans of beer, 1 Shots of liquor per week    Comment: 1-2/month  . Drug use: No  . Sexual activity: Not on file  Lifestyle  . Physical activity    Days per week: Not on file    Minutes per session: Not on file  . Stress: Not on file  Relationships  . Social Herbalist on phone: Not on file    Gets together: Not on file    Attends religious service: Not on file    Active member of  club or organization: Not on file    Attends meetings of clubs or organizations: Not on file    Relationship status: Not on file  . Intimate partner violence    Fear of current or ex partner: Not on file    Emotionally abused: Not on file    Physically abused: Not on file    Forced sexual activity: Not on file  Other Topics Concern  . Not on file  Social History Narrative   Married (kids are patients here, wife seen elsewhere). 3 step children, 1 biological. No grandkids.    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 acetaminophen (TYLENOL) 325 MG tablet Take 2 tablets (650 mg total) by mouth every 4 (four) hours as needed for headache or mild pain.    . carvedilol (COREG) 6.25 MG tablet TAKE 1 TABLET BY MOUTH TWICE DAILY 60 tablet 8  . clopidogrel (PLAVIX) 75 MG tablet TAKE 1 TABLET(75 MG) BY MOUTH DAILY 90 tablet 2  . Coenzyme Q10 60 MG CAPS Take 1 capsule by mouth daily.     [name]. ezetimibe (ZETIA) 10 MG tablet TAKE 1 TABLET(10 MG) BY MOUTH DAILY 90 tablet 3  . glucose blood (ONETOUCH VERIO) test strip 1 each by Other route 2 (two) times daily. And lancets 2/day 200 each 12  . levothyroxine (SYNTHROID, LEVOTHROID) 75 MCG tablet TAKE 1 TABLET(75 MCG) BY MOUTH DAILY BEFORE BREAKFAST 90 tablet 3  . nitroGLYCERIN (NITROSTAT) 0.4 MG SL tablet Place 1 tablet (0.4 mg total) under the tongue every 5 (five) minutes as needed for chest pain. Chest pain 30 tablet 5  . NOVOLOG FLEXPEN 100 UNIT/ML FlexPen INJECT 6 UNITS UNDER THE SKIN THREE TIMES DAILY WITH MEALS 15 mL 11  . Probiotic Product (PROBIOTIC PO) Take 1 capsule by mouth every morning.    . rosuvastatin (CRESTOR) 40 MG tablet TAKE 1 TABLET BY MOUTH EVERY DAY. PLEASE KEEP APPT WITH DISCARD REMAINDER. NISHAN IN [place] FOR FUTURE REFILLS. 30 tablet 10  . sacubitril-valsartan (ENTRESTO) 24-26 MG Take 1 tablet by mouth 2 (two) times daily. 60 tablet 11   No current facility-administered medications on file prior to visit.     No Known Allergies  Family History  Problem Relation Age of Onset  . CAD Father   . Other Mother        passed right before 4690- tumor on kidney recent diagnosis  . Dementia Mother        progressing rapidly  . CAD Paternal Grandfather        4770s  . CAD Paternal Grandmother        2070s  . Brain cancer Maternal  Grandmother   . Lymphoma Sister        b cell  . Celiac disease Sister   . Diabetes Neg Hx     BP 104/60 (BP Location: Right Arm, Patient Position: Sitting, Cuff Size: Normal)   Pulse (!) 58   Ht 5\' 8"  (1.727 m)  Wt 185 lb 12.8 oz (84.3 kg)   SpO2 94%   BMI 28.25 kg/m    Review of Systems Denies LOC.      Objective:   Physical Exam VITAL SIGNS:  See vs page GENERAL: no distress Pulses: dorsalis pedis intact bilat.   MSK: no deformity of the feet CV: no leg edema Skin:  no ulcer on the feet.  normal color and temp on the feet. Neuro: sensation is intact to touch on the feet   Lab Results  Component Value Date   HGBA1C 8.3 (A) 08/17/2018   Lab Results  Component Value Date   CREATININE 1.28 (H) 04/01/2018   BUN 19 04/01/2018   NA 141 04/01/2018   K 4.3 04/01/2018   CL 99 04/01/2018   CO2 25 04/01/2018      Assessment & Plan:  Type 1 DM, with PAD: he needs increased rx Hypoglycemia: this limits aggressiveness of glycemic control   Patient Instructions  Please increase the Basaglar to 40 units each morning, and: Please continue the same Novolog. Please let us know if you want to consider the "V-GO" disposable pump. check your blood sugar twice a day.  vary the time of day when you check, between before the 3 meals, and at bedtime.  also check if you have symptoms of your blood sugar being too high or too low.  please keep a record of the readings and bring it to your next appointment here (or you can bring the meter itself).  You can write it on any piece of paper.  please call us sooner if your blood sugar goes below 70, or if you have a lot of readings over 200.   Please come back for a follow-up appointment in 2 months.

## 2018-08-17 NOTE — Progress Notes (Signed)
ae

## 2018-08-19 ENCOUNTER — Telehealth: Payer: Self-pay

## 2018-08-19 ENCOUNTER — Other Ambulatory Visit: Payer: Self-pay | Admitting: Internal Medicine

## 2018-08-19 ENCOUNTER — Other Ambulatory Visit: Payer: Self-pay | Admitting: Nurse Practitioner

## 2018-08-19 NOTE — Telephone Encounter (Signed)
Alvin Quinney (Key: [id number]) Rx #: [id number] NovoLOG FlexPen 100UNIT/ML pen-injectors   Form [employer] Electronic PA Form (NCPDP) Created 24 minutes ago Sent to Plan 1 minute ago Determination Wait for Questions [employer]_NCPDP typically responds with questions in less than 15 minutes, but may take up to 24 hours.

## 2018-08-19 NOTE — Telephone Encounter (Signed)
Rodgerick Quraishi (Key: [id number]) Rx #: [id number] NovoLOG FlexPen 100UNIT/ML pen-injectors   Form Charity fundraiser PA Form (NCPDP) Created 5 hours ago Sent to Plan 5 hours ago Plan Response 4 hours ago Submit Clinical Questions Determination Unknown 14 minutes ago Message from [employer] PA has been resolved, no additional PA is required. For further inquiries please contact the number on the back of the member prescription card. (Message [phone])

## 2018-08-28 ENCOUNTER — Ambulatory Visit: Payer: 59 | Admitting: Family Medicine

## 2018-10-13 ENCOUNTER — Ambulatory Visit (INDEPENDENT_AMBULATORY_CARE_PROVIDER_SITE_OTHER): Payer: 59 | Admitting: *Deleted

## 2018-10-13 DIAGNOSIS — I255 Ischemic cardiomyopathy: Secondary | ICD-10-CM | POA: Diagnosis not present

## 2018-10-13 LAB — CUP PACEART REMOTE DEVICE CHECK
Battery Remaining Longevity: 82 mo
Battery Voltage: 2.99 V
Brady Statistic AP VP Percent: 35.66 %
Brady Statistic AP VS Percent: 0.59 %
Brady Statistic AS VP Percent: 62.77 %
Brady Statistic AS VS Percent: 0.98 %
Brady Statistic RA Percent Paced: 36.17 %
Brady Statistic RV Percent Paced: 32.55 %
Date Time Interrogation Session: 20200908083723
HighPow Impedance: 60 Ohm
Implantable Lead Implant Date: 20171206
Implantable Lead Implant Date: 20171206
Implantable Lead Implant Date: 20171206
Implantable Lead Location: 753858
Implantable Lead Location: 753859
Implantable Lead Location: 753860
Implantable Lead Model: 4598
Implantable Lead Model: 5076
Implantable Pulse Generator Implant Date: 20171206
Lead Channel Impedance Value: 166.114
Lead Channel Impedance Value: 178.5 Ohm
Lead Channel Impedance Value: 178.5 Ohm
Lead Channel Impedance Value: 184.154
Lead Channel Impedance Value: 199.5 Ohm
Lead Channel Impedance Value: 323 Ohm
Lead Channel Impedance Value: 323 Ohm
Lead Channel Impedance Value: 342 Ohm
Lead Channel Impedance Value: 399 Ohm
Lead Channel Impedance Value: 399 Ohm
Lead Channel Impedance Value: 399 Ohm
Lead Channel Impedance Value: 399 Ohm
Lead Channel Impedance Value: 437 Ohm
Lead Channel Impedance Value: 589 Ohm
Lead Channel Impedance Value: 589 Ohm
Lead Channel Impedance Value: 627 Ohm
Lead Channel Impedance Value: 646 Ohm
Lead Channel Impedance Value: 665 Ohm
Lead Channel Pacing Threshold Amplitude: 0.375 V
Lead Channel Pacing Threshold Amplitude: 0.875 V
Lead Channel Pacing Threshold Amplitude: 1 V
Lead Channel Pacing Threshold Pulse Width: 0.4 ms
Lead Channel Pacing Threshold Pulse Width: 0.4 ms
Lead Channel Pacing Threshold Pulse Width: 0.4 ms
Lead Channel Sensing Intrinsic Amplitude: 16.375 mV
Lead Channel Sensing Intrinsic Amplitude: 16.375 mV
Lead Channel Sensing Intrinsic Amplitude: 2.75 mV
Lead Channel Sensing Intrinsic Amplitude: 2.75 mV
Lead Channel Setting Pacing Amplitude: 1.5 V
Lead Channel Setting Pacing Amplitude: 2 V
Lead Channel Setting Pacing Amplitude: 2.5 V
Lead Channel Setting Pacing Pulse Width: 0.4 ms
Lead Channel Setting Pacing Pulse Width: 0.4 ms
Lead Channel Setting Sensing Sensitivity: 0.3 mV

## 2018-10-16 ENCOUNTER — Other Ambulatory Visit: Payer: Self-pay | Admitting: Nurse Practitioner

## 2018-10-19 ENCOUNTER — Other Ambulatory Visit: Payer: Self-pay

## 2018-10-19 DIAGNOSIS — N183 Chronic kidney disease, stage 3 unspecified: Secondary | ICD-10-CM

## 2018-10-19 DIAGNOSIS — E1122 Type 2 diabetes mellitus with diabetic chronic kidney disease: Secondary | ICD-10-CM

## 2018-10-19 MED ORDER — BASAGLAR KWIKPEN 100 UNIT/ML ~~LOC~~ SOPN: 40.0000 [IU] | PEN_INJECTOR | SUBCUTANEOUS | 2 refills | Status: DC

## 2018-10-23 ENCOUNTER — Ambulatory Visit: Payer: 59 | Admitting: Endocrinology

## 2018-10-28 ENCOUNTER — Encounter: Payer: Self-pay | Admitting: Cardiology

## 2018-10-28 NOTE — Progress Notes (Signed)
Remote ICD transmission.   

## 2018-10-31 ENCOUNTER — Other Ambulatory Visit: Payer: Self-pay | Admitting: Nurse Practitioner

## 2018-11-06 ENCOUNTER — Ambulatory Visit: Payer: 59 | Admitting: Endocrinology

## 2018-11-17 NOTE — Progress Notes (Signed)
Electrophysiology Office Note Date: 11/20/2018  ID:  John BolusStephen A Carrillo, DOB [birth year], MRN [id number]  PCP: Shelva MajesticHunter, Atanacio O, MD Primary Cardiologist: John Carrillo Electrophysiologist: Allred  CC: Routine ICD follow-up  John Carrillo is a 68 y.o. male seen today for Dr Johney FrameAllred.  He presents today for routine electrophysiology followup.  Since last being seen in our clinic, the patient reports doing very well. He is considering retiring soon.  He enjoys Psychologist, forensicsailboat racing.  He denies chest pain, palpitations, dyspnea, PND, orthopnea, nausea, vomiting, dizziness, syncope, edema, weight gain, or early satiety.  He has not had ICD shocks.   Device History: MDT CRTD implanted 2017 for ICM, CHF History of appropriate therapy: No History of AAD therapy: No AV opt done by me 12/2016 - best with adaptiveCRT on (no programming changes made)   Past Medical History:  Diagnosis Date  . Acute kidney injury superimposed on chronic kidney disease ([facility]) 01/25/2015  . CORONARY ARTERY DISEASE 08/11/2006  . CVA 12/19/2006   2008  . DIABETES MELLITUS, TYPE I 08/11/2006  . ECZEMA, HANDS 12/15/2009  . HYPERLIPIDEMIA 08/11/2006  . HYPERTENSION 08/11/2006   Past Surgical History:  Procedure Laterality Date  . CARDIAC CATHETERIZATION N/A 01/16/2015   Procedure: Left Heart Cath and Coronary Angiography;  Surgeon: Runell GessJonathan J Berry, MD;  [facility];  Service: Cardiovascular;  Laterality: N/A;  . CORONARY STENT PLACEMENT    . EP IMPLANTABLE DEVICE N/A 01/10/2016   MDT Claria MRI Quad CRTD implanted by Dr Johney FrameAllred  . TEE WITHOUT CARDIOVERSION N/A 01/26/2015   Procedure: TRANSESOPHAGEAL ECHOCARDIOGRAM (TEE);  Surgeon: John FairMihai Croitoru, MD;  [facility];  Service: Cardiovascular;  Laterality: N/A;  . TONSILECTOMY, ADENOIDECTOMY, BILATERAL MYRINGOTOMY AND TUBES      Current Outpatient Medications  Medication Sig Dispense Refill  . acetaminophen (TYLENOL) 325 MG tablet Take 2 tablets (650 mg  total) by mouth every 4 (four) hours as needed for headache or mild pain.    . carvedilol (COREG) 6.25 MG tablet TAKE 1 TABLET BY MOUTH TWICE DAILY 180 tablet 3  . clopidogrel (PLAVIX) 75 MG tablet TAKE 1 TABLET(75 MG) BY MOUTH DAILY 90 tablet 2  . Coenzyme Q10 60 MG CAPS Take 1 capsule by mouth daily.     [name]. ezetimibe (ZETIA) 10 MG tablet TAKE 1 TABLET(10 MG) BY MOUTH DAILY 90 tablet 3  . glucose blood (ONETOUCH VERIO) test strip 1 each by Other route 2 (two) times daily. And lancets 2/day 200 each 12  . Insulin Glargine (BASAGLAR KWIKPEN) 100 UNIT/ML SOPN Inject 0.4 mLs (40 Units total) into the skin every morning. 15 mL 2  . levothyroxine (SYNTHROID, LEVOTHROID) 75 MCG tablet TAKE 1 TABLET(75 MCG) BY MOUTH DAILY BEFORE BREAKFAST 90 tablet 3  . nitroGLYCERIN (NITROSTAT) 0.4 MG SL tablet Place 1 tablet (0.4 mg total) under the tongue every 5 (five) minutes as needed for chest pain. Chest pain 30 tablet 5  . NOVOLOG FLEXPEN 100 UNIT/ML FlexPen INJECT 6 UNITS UNDER THE SKIN THREE TIMES DAILY WITH MEALS 15 mL 11  . Probiotic Product (PROBIOTIC PO) Take 1 capsule by mouth every morning.    . rosuvastatin (CRESTOR) 40 MG tablet TAKE 1 TABLET BY MOUTH EVERY DAY. PLEASE KEEP APPT WITH DISCARD REMAINDER. NISHAN IN [place] FOR FUTURE REFILLS. 30 tablet 10  . sacubitril-valsartan (ENTRESTO) 24-26 MG Take 1 tablet by mouth 2 (two) times daily. 60 tablet 11   No current facility-administered medications for this visit.     Allergies:  Patient has no known allergies.   Social History: Social History   Socioeconomic History  . Marital status: Married    Spouse name: Not on file  . Number of children: Not on file  . Years of education: Not on file  . Highest education level: Not on file  Occupational History  . Occupation: Engineer, maintenance: [employer]  Social Needs  . Financial resource strain: Not on file  . Food insecurity    Worry: Not on file    Inability: Not on file  .  Transportation needs    Medical: Not on file    Non-medical: Not on file  Tobacco Use  . Smoking status: Former Smoker    Packs/day: 1.00    Years: 30.00    Pack years: 30.00    Types: Cigarettes    Quit date: 05/03/2000    Years since quitting: 18.5  . Smokeless tobacco: Never Used  Substance and Sexual Activity  . Alcohol use: Yes    Alcohol/week: 3.0 standard drinks    Types: 1 Glasses of wine, 1 Cans of beer, 1 Shots of liquor per week    Comment: 1-2/month  . Drug use: No  . Sexual activity: Not on file  Lifestyle  . Physical activity    Days per week: Not on file    Minutes per session: Not on file  . Stress: Not on file  Relationships  . Social Musician on phone: Not on file    Gets together: Not on file    Attends religious service: Not on file    Active member of club or organization: Not on file    Attends meetings of clubs or organizations: Not on file    Relationship status: Not on file  . Intimate partner violence    Fear of current or ex partner: Not on file    Emotionally abused: Not on file    Physically abused: Not on file    Forced sexual activity: Not on file  Other Topics Concern  . Not on file  Social History Narrative   Married (kids are patients here, wife seen elsewhere). 3 step children, 1 biological. No grandkids.    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 CAD Father   . Other Mother        passed right before 41- tumor on kidney recent diagnosis  . Dementia Mother        progressing rapidly   . CAD Paternal Grandfather        65s  . CAD Paternal Grandmother        70s  . Brain cancer Maternal Grandmother   . Lymphoma Sister        b cell  . Celiac disease Sister   . Diabetes Neg Hx     Review of Systems: All other systems reviewed and are otherwise negative except as noted above.   Physical Exam: VS:  BP 118/68  Pulse (!) 59   Ht 5\' 9"  (1.753 m)   Wt 189 lb 9.6 oz (86 kg)   SpO2 94%   BMI 28.00 kg/m  , BMI Body mass index is 28 kg/m.  GEN- The patient is well appearing, alert and oriented x 3 today.   HEENT: normocephalic, atraumatic; sclera clear, conjunctiva pink; hearing intact; oropharynx clear; neck supple  Lungs- Clear to ausculation bilaterally, normal work of breathing.  No wheezes, rales, rhonchi Heart- Regular rate and rhythm (paced) GI- soft, non-tender, non-distended, bowel sounds present, no hepatosplenomegaly Extremities- no clubbing, cyanosis, or edema; DP/PT/radial pulses 2+ bilaterally MS- no significant deformity or atrophy Skin- warm and dry, no rash or lesion; ICD pocket well healed Psych- euthymic mood, full affect Neuro- strength and sensation are intact  ICD interrogation- reviewed in detail today,  See PACEART report  EKG:  EKG is not ordered today.  Recent Labs: 02/13/2018: ALT 63; Hemoglobin 14.7; Platelets 174.0; TSH 4.27 04/01/2018: BUN 19; Creatinine, Ser 1.28; Potassium 4.3; Sodium 141   Wt Readings from Last 3 Encounters:  11/20/18 189 lb 9.6 oz (86 kg)  08/17/18 185 lb 12.8 oz (84.3 kg)  05/22/18 177 lb (80.3 kg)     Other studies Reviewed: Additional studies/ records that were reviewed today include: Dr Kyla Balzarine office notes   Assessment and Plan:  1.  Chronic systolic dysfunction euvolemic today Stable on an appropriate medical regimen Normal ICD function See Pace Art report No changes today BMET today  2.  CAD No recent ischemic symptoms Continue medical therapy  3.  Prior CVA No AF to date on device  interrogation  4.  Sustained VT No recurrence    Current medicines are reviewed at length with the patient today.   The patient does not have concerns regarding his medicines.  The following changes were made today:  none  Labs/ tests ordered today include: BMET No orders of the defined types were placed in this encounter.    Disposition:   Follow up with [facility], Dr Johnsie Cancel as scheduled, me in 1 year      Signed, Chanetta Marshall, NP 11/20/2018 8:23 AM  [place] [address] [phone] (office) [phone] (fax)

## 2018-11-20 ENCOUNTER — Other Ambulatory Visit: Payer: Self-pay

## 2018-11-20 ENCOUNTER — Ambulatory Visit: Payer: 59 | Admitting: Nurse Practitioner

## 2018-11-20 ENCOUNTER — Encounter: Payer: Self-pay | Admitting: Nurse Practitioner

## 2018-11-20 VITALS — BP 118/68 | HR 59 | Ht 69.0 in | Wt 189.6 lb

## 2018-11-20 DIAGNOSIS — I472 Ventricular tachycardia, unspecified: Secondary | ICD-10-CM

## 2018-11-20 DIAGNOSIS — E785 Hyperlipidemia, unspecified: Secondary | ICD-10-CM

## 2018-11-20 DIAGNOSIS — Z79899 Other long term (current) drug therapy: Secondary | ICD-10-CM

## 2018-11-20 DIAGNOSIS — I5022 Chronic systolic (congestive) heart failure: Secondary | ICD-10-CM | POA: Diagnosis not present

## 2018-11-20 DIAGNOSIS — I2510 Atherosclerotic heart disease of native coronary artery without angina pectoris: Secondary | ICD-10-CM | POA: Diagnosis not present

## 2018-11-20 DIAGNOSIS — Z9861 Coronary angioplasty status: Secondary | ICD-10-CM

## 2018-11-20 DIAGNOSIS — I639 Cerebral infarction, unspecified: Secondary | ICD-10-CM

## 2018-11-20 LAB — BASIC METABOLIC PANEL
BUN/Creatinine Ratio: 18 (ref 10–24)
BUN: 24 mg/dL (ref 8–27)
CO2: 24 mmol/L (ref 20–29)
Calcium: 8.9 mg/dL (ref 8.6–10.2)
Chloride: 105 mmol/L (ref 96–106)
Creatinine, Ser: 1.36 mg/dL — ABNORMAL HIGH (ref 0.76–1.27)
GFR calc Af Amer: 62 mL/min/{1.73_m2} (ref >59)
GFR calc non Af Amer: 53 mL/min/{1.73_m2} — ABNORMAL LOW (ref >59)
Glucose: 109 mg/dL — ABNORMAL HIGH (ref 65–99)
Potassium: 4 mmol/L (ref 3.5–5.2)
Sodium: 141 mmol/L (ref 134–144)

## 2018-11-20 LAB — CUP PACEART INCLINIC DEVICE CHECK
Date Time Interrogation Session: 20201016082448
Implantable Lead Implant Date: 20171206
Implantable Lead Implant Date: 20171206
Implantable Lead Implant Date: 20171206
Implantable Lead Location: 753858
Implantable Lead Location: 753859
Implantable Lead Location: 753860
Implantable Lead Model: 4598
Implantable Lead Model: 5076
Implantable Pulse Generator Implant Date: 20171206

## 2018-11-20 MED ORDER — CLOPIDOGREL BISULFATE 75 MG PO TABS: 75.0000 mg | ORAL_TABLET | Freq: Every day | ORAL | 3 refills | Status: DC

## 2018-11-20 MED ORDER — EZETIMIBE 10 MG PO TABS: ORAL_TABLET | ORAL | 3 refills | Status: DC

## 2018-11-20 MED ORDER — ROSUVASTATIN CALCIUM 40 MG PO TABS: ORAL_TABLET | ORAL | 3 refills | Status: DC

## 2018-11-20 MED ORDER — CARVEDILOL 6.25 MG PO TABS: 6.2500 mg | ORAL_TABLET | Freq: Two times a day (BID) | ORAL | 3 refills | Status: DC

## 2018-11-20 NOTE — Progress Notes (Unsigned)
done

## 2018-11-20 NOTE — Patient Instructions (Signed)
Medication Instructions:  none *If you need a refill on your cardiac medications before your next appointment, please call your [place] BMET If you have labs (blood work) drawn today and your tests are completely normal, you will receive your results only by: [name] MyChart Message (if you have MyChart) OR . A paper copy in the mail If you have any lab test that is abnormal or we need to change your treatment, we will call you to review the results.  Testing/Procedures: NONE  Follow-Up: At [facility], you and your health needs are our priority.  As part of our continuing mission to provide you with exceptional heart care, we have created designated Provider Care Teams.  These Care Teams include your primary Cardiologist (physician) and Advanced Practice Providers (APPs -  Physician Assistants and Nurse Practitioners) who all work together to provide you with the care you need, when you need it.  Your next appointment:   12 months  The format for your next appointment:   In Person  Provider:   Chanetta Marshall, NP  Other Instructions Remote monitoring is used to monitor your  ICD from home. This monitoring reduces the number of office visits required to check your device to one time per year. It allows [place] to keep an eye on the functioning of your device to ensure it is working properly. You are scheduled for a device check from home on 01/12/19. You may send your transmission at any time that day. If you have a wireless device, the transmission will be sent automatically. After your physician reviews your transmission, you will receive a postcard with your next transmission date.

## 2018-11-23 ENCOUNTER — Telehealth: Payer: Self-pay

## 2018-11-23 NOTE — Telephone Encounter (Signed)
Notes recorded by Frederik Schmidt, RN on 11/23/2018 at 8:32 AM EDT  The patient has been notified of the result and verbalized understanding. All questions (if any) were answered.  Frederik Schmidt, RN 11/23/2018 8:32 AM

## 2018-11-23 NOTE — Telephone Encounter (Signed)
-----   Message from John Berthold, NP sent at 11/22/2018  9:14 AM EDT ----- Please notify patient of stable labs. Thanks!

## 2018-12-13 NOTE — Progress Notes (Deleted)
Evaluation Performed:  Follow-up visit  Date:  12/13/2018   ID:  John Carrillo, DOB [birth year], MRN [id number]  Provider Location: Office  PCP:  Marin Olp, MD  Cardiologist:  Johnsie Cancel Electrophysiologist:  None   Chief Complaint:  Ischemic Cardiomyopathy  History of Present Illness:    John Carrillo is a 68 y.o. male with a hx of CAD status post anterior MI 18 years ago treated with stenting of the LAD, ICM, LBBB, prior CVA, HL, DM 2.  He was evaluated by Dr. Virl Axe in 2008 for ICD implantation for primary prevention.  He declined at that time.  Had SEMI 2016 with RCA intervention EF 15% Readmitted 01/23/17 with right occipital lobe CVA placed on Brillinta and Eliquis. Eventually agreed to MDT CRTD device implant Last TTE done 08/15/17 EF improved to 30-35% started on entresto January and due to low BP only tolerating coreg 6.25 bid and entresto 24-26 mg  Daughter John Carrillo is also a sophomore   at [employer] oldest daughter is in [place] working for [employer] and traveling to [place]   No CHF symptoms Discussed high A1c and need to cut back on cookies A1c 8.3 08/17/18   ***  The patient does not have symptoms concerning for COVID-19 infection (fever, chills, cough, or new shortness of breath).    Past Medical History:  Diagnosis Date  . Acute kidney injury superimposed on chronic kidney disease ([place]) 01/25/2015  . CORONARY ARTERY DISEASE 08/11/2006  . CVA 12/19/2006   2008  . DIABETES MELLITUS, TYPE I 08/11/2006  . ECZEMA, HANDS 12/15/2009  . HYPERLIPIDEMIA 08/11/2006  . HYPERTENSION 08/11/2006   Past Surgical History:  Procedure Laterality Date  . CARDIAC CATHETERIZATION N/A 01/16/2015   Procedure: Left Heart Cath and Coronary Angiography;  Surgeon: Lorretta Harp, MD;  Location: [place] [facility];  Service: Cardiovascular;  Laterality: N/A;  . CORONARY STENT PLACEMENT    . EP IMPLANTABLE DEVICE N/A 01/10/2016   MDT Claria MRI Quad  CRTD implanted by Dr Rayann Heman  . TEE WITHOUT CARDIOVERSION N/A 01/26/2015   Procedure: TRANSESOPHAGEAL ECHOCARDIOGRAM (TEE);  Surgeon: Sanda Klein, MD;  Location: [facility] ENDOSCOPY;  Service: Cardiovascular;  Laterality: N/A;  . TONSILECTOMY, ADENOIDECTOMY, BILATERAL MYRINGOTOMY AND TUBES       No outpatient medications have been marked as taking for the 12/17/18 encounter (Appointment) with Josue Hector, MD.     Allergies:   Patient has no known allergies.   Social History   Tobacco Use  . Smoking status: Former Smoker    Packs/day: 1.00    Years: 30.00    Pack years: 30.00    Types: Cigarettes    Quit date: 05/03/2000    Years since quitting: 18.6  . Smokeless tobacco: Never Used  Substance Use Topics  . Alcohol use: Yes    Alcohol/week: 3.0 standard drinks    Types: 1 Glasses of wine, 1 Cans of beer, 1 Shots of liquor per week    Comment: 1-2/month  . Drug use: No     Family Hx: The patient's family history includes Brain cancer in his maternal grandmother; CAD in his father, paternal grandfather, and paternal grandmother; Celiac disease in his sister; Dementia in his mother; Lymphoma in his sister; Other in his mother. There is no history of Diabetes.  ROS:   Please see the history of present illness.     All other systems reviewed and are negative.   Prior CV studies:  The following studies were reviewed today:  PaceArt 04/13/18 Echo 08/15/17   Labs/Other Tests and Data Reviewed:    EKG:  AV dual pacing QRS 188 msec 02/27/18  Recent Labs: 02/13/2018: ALT 63; Hemoglobin 14.7; Platelets 174.0; TSH 4.27 11/20/2018: BUN 24; Creatinine, Ser 1.36; Potassium 4.0; Sodium 141   Recent Lipid Panel Lab Results  Component Value Date/Time   CHOL 123 02/13/2018 09:03 AM   TRIG 61.0 02/13/2018 09:03 AM   HDL 53.10 02/13/2018 09:03 AM   CHOLHDL 2 02/13/2018 09:03 AM   LDLCALC 57 02/13/2018 09:03 AM   LDLDIRECT 85.0 07/11/2017 08:39 AM    Wt Readings from Last 3  Encounters:  11/20/18 189 lb 9.6 oz (86 kg)  08/17/18 185 lb 12.8 oz (84.3 kg)  05/22/18 177 lb (80.3 kg)     Objective:    Vital Signs:  There were no vitals taken for this visit.   Affect appropriate Healthy:  appears stated age HEENT: normal Neck supple with no adenopathy JVP normal no bruits no thyromegaly Lungs clear with no wheezing and good diaphragmatic motion Heart:  S1/S2 no murmur, no rub, gallop or click PMI increased  Abdomen: benighn, BS positve, no tenderness, no AAA no bruit.  No HSM or HJR Distal pulses intact with no bruits No edema Neuro non-focal Skin warm and dry No muscular weakness AICD under left clavicle   ASSESSMENT & PLAN:    1. CHF:  EF 30-35% functional class one continue current meds titration limited by low BP 2. CAD:  Post large anterior MI with stenting of LAD 18 years ago and SEMI stenting RCA by Dr Allyson Sabal 01/16/15 Since it's been 4 years and he already has compromised LV function will order f/u myovue  3. CRT-AICD:  F/u EP no d/c normal PaceArt review 4. HLD:  Continue crestor and zetia LDL 85 5. DM:  Discussed low carb diet.  Target hemoglobin A1c is 6.5 or less.  Continue current medications. 6. Thyroid:  On replacement TSH normal   COVID-19 Education: The signs and symptoms of COVID-19 were discussed with the patient and how to seek care for testing (follow up with PCP or arrange E-visit).  The importance of social distancing was discussed today.  Time:   Today, I have spent 30 minutes       Medication Adjustments/Labs and Tests Ordered: Current medicines are reviewed at length with the patient today.  Concerns regarding medicines are outlined above.   Tests Ordered:  Myovue   Medication Changes: No orders of the defined types were placed in this encounter.   Disposition:  Follow up in a year if myovue perfusion low risk and non ischemic   Signed, Charlton Haws, MD  12/13/2018 1:46 PM    [facility]

## 2018-12-17 ENCOUNTER — Ambulatory Visit: Payer: 59 | Admitting: Cardiovascular Disease

## 2018-12-18 ENCOUNTER — Ambulatory Visit: Payer: 59 | Admitting: Endocrinology

## 2019-01-12 ENCOUNTER — Ambulatory Visit (INDEPENDENT_AMBULATORY_CARE_PROVIDER_SITE_OTHER): Payer: 59 | Admitting: *Deleted

## 2019-01-12 DIAGNOSIS — I255 Ischemic cardiomyopathy: Secondary | ICD-10-CM

## 2019-01-13 ENCOUNTER — Other Ambulatory Visit: Payer: Self-pay

## 2019-01-13 LAB — CUP PACEART REMOTE DEVICE CHECK
Battery Remaining Longevity: 79 mo
Battery Voltage: 2.99 V
Brady Statistic AP VP Percent: 25.35 %
Brady Statistic AP VS Percent: 0.44 %
Brady Statistic AS VP Percent: 73 %
Brady Statistic AS VS Percent: 1.21 %
Brady Statistic RA Percent Paced: 25.69 %
Brady Statistic RV Percent Paced: 22.62 %
Date Time Interrogation Session: 20201208044224
HighPow Impedance: 71 Ohm
Implantable Lead Implant Date: 20171206
Implantable Lead Implant Date: 20171206
Implantable Lead Implant Date: 20171206
Implantable Lead Location: 753858
Implantable Lead Location: 753859
Implantable Lead Location: 753860
Implantable Lead Model: 4598
Implantable Lead Model: 5076
Implantable Pulse Generator Implant Date: 20171206
Lead Channel Impedance Value: 190 Ohm
Lead Channel Impedance Value: 194.634
Lead Channel Impedance Value: 194.634
Lead Channel Impedance Value: 203.256
Lead Channel Impedance Value: 208.568
Lead Channel Impedance Value: 323 Ohm
Lead Channel Impedance Value: 380 Ohm
Lead Channel Impedance Value: 380 Ohm
Lead Channel Impedance Value: 399 Ohm
Lead Channel Impedance Value: 399 Ohm
Lead Channel Impedance Value: 437 Ohm
Lead Channel Impedance Value: 437 Ohm
Lead Channel Impedance Value: 437 Ohm
Lead Channel Impedance Value: 627 Ohm
Lead Channel Impedance Value: 665 Ohm
Lead Channel Impedance Value: 665 Ohm
Lead Channel Impedance Value: 665 Ohm
Lead Channel Impedance Value: 703 Ohm
Lead Channel Pacing Threshold Amplitude: 0.375 V
Lead Channel Pacing Threshold Amplitude: 0.875 V
Lead Channel Pacing Threshold Amplitude: 1 V
Lead Channel Pacing Threshold Pulse Width: 0.4 ms
Lead Channel Pacing Threshold Pulse Width: 0.4 ms
Lead Channel Pacing Threshold Pulse Width: 0.4 ms
Lead Channel Sensing Intrinsic Amplitude: 14.625 mV
Lead Channel Sensing Intrinsic Amplitude: 14.625 mV
Lead Channel Sensing Intrinsic Amplitude: 2.5 mV
Lead Channel Sensing Intrinsic Amplitude: 2.5 mV
Lead Channel Setting Pacing Amplitude: 1.5 V
Lead Channel Setting Pacing Amplitude: 2 V
Lead Channel Setting Pacing Amplitude: 2.5 V
Lead Channel Setting Pacing Pulse Width: 0.4 ms
Lead Channel Setting Pacing Pulse Width: 0.4 ms
Lead Channel Setting Sensing Sensitivity: 0.3 mV

## 2019-01-15 ENCOUNTER — Other Ambulatory Visit: Payer: Self-pay

## 2019-01-15 ENCOUNTER — Encounter: Payer: Self-pay | Admitting: Endocrinology

## 2019-01-15 ENCOUNTER — Other Ambulatory Visit: Payer: Self-pay | Admitting: Endocrinology

## 2019-01-15 ENCOUNTER — Ambulatory Visit (INDEPENDENT_AMBULATORY_CARE_PROVIDER_SITE_OTHER): Payer: 59 | Admitting: Endocrinology

## 2019-01-15 VITALS — BP 114/70 | HR 64 | Ht 69.0 in | Wt 193.6 lb

## 2019-01-15 DIAGNOSIS — N183 Chronic kidney disease, stage 3 unspecified: Secondary | ICD-10-CM

## 2019-01-15 DIAGNOSIS — Z794 Long term (current) use of insulin: Secondary | ICD-10-CM

## 2019-01-15 DIAGNOSIS — E1051 Type 1 diabetes mellitus with diabetic peripheral angiopathy without gangrene: Secondary | ICD-10-CM | POA: Diagnosis not present

## 2019-01-15 LAB — POCT GLYCOSYLATED HEMOGLOBIN (HGB A1C): Hemoglobin A1C: 8.5 % — AB (ref 4.0–5.6)

## 2019-01-15 MED ORDER — NOVOLOG FLEXPEN 100 UNIT/ML ~~LOC~~ SOPN: 9.0000 [IU] | PEN_INJECTOR | Freq: Three times a day (TID) | SUBCUTANEOUS | 11 refills | Status: DC

## 2019-01-15 MED ORDER — BASAGLAR KWIKPEN 100 UNIT/ML ~~LOC~~ SOPN: 36.0000 [IU] | PEN_INJECTOR | SUBCUTANEOUS | 2 refills | Status: DC

## 2019-01-15 NOTE — Progress Notes (Signed)
Subjective:    Patient ID: John Carrillo, male    DOB: [birth year], 68 y.o.   MRN: [id number]  HPI Pt returns for f/u of diabetes mellitus:  DM type: 1 Dx'ed: 2008. Complications: CVA, PAD, renal insufficiency, and CAD.   Therapy: insulin since soon after dx.  DKA: never.  Severe hypoglycemia: never.   Pancreatitis: never.   Other: he declines pump, including V-GO; he takes multiple daily injections, but basal insulin is emphasized, after poor results with usual multiple daily injections regimen; He says his ability to care for his DM has been compromised by wife's illness.   Interval history:  Pt says he seldom misses the insulin.  pt states cbg's vary from 50-220.  pt states he feels well in general.  He seldom has hypoglycemia, and these episodes are mild.  This happens with activity.  He averages approx 6 units of novolog 3 times a day (just before each meal).   Past Medical History:  Diagnosis Date  . Acute kidney injury superimposed on chronic kidney disease (HCC) 01/25/2015  . CORONARY ARTERY DISEASE 08/11/2006  . CVA 12/19/2006   2008  . DIABETES MELLITUS, TYPE I 08/11/2006  . ECZEMA, HANDS 12/15/2009  . HYPERLIPIDEMIA 08/11/2006  . HYPERTENSION 08/11/2006    Past Surgical History:  Procedure Laterality Date  . CARDIAC CATHETERIZATION N/A 01/16/2015   Procedure: Left Heart Cath and Coronary Angiography;  Surgeon: [name], MD;  [facility] INVASIVE CV LAB;  Service: Cardiovascular;  Laterality: N/A;  . CORONARY STENT PLACEMENT    . EP IMPLANTABLE DEVICE N/A 01/10/2016   MDT Claria MRI Quad CRTD implanted by Dr [name]  . TEE WITHOUT CARDIOVERSION N/A 01/26/2015   Procedure: TRANSESOPHAGEAL ECHOCARDIOGRAM (TEE);  Surgeon: [name], MD;  Location: [facility] ENDOSCOPY;  Service: Cardiovascular;  Laterality: N/A;  . TONSILECTOMY, ADENOIDECTOMY, BILATERAL MYRINGOTOMY AND TUBES      Social History   Socioeconomic History  . Marital status: Married    Spouse name: Not on  file  . Number of children: Not on file  . Years of education: Not on file  . Highest education level: Not on file  Occupational History  . Occupation: Engineer, maintenance: [employer]  Tobacco Use  . Smoking status: Former Smoker    Packs/day: 1.00    Years: 30.00    Pack years: 30.00    Types: Cigarettes    Quit date: 05/03/2000    Years since quitting: 18.7  . Smokeless tobacco: Never Used  Substance and Sexual Activity  . Alcohol use: Yes    Alcohol/week: 3.0 standard drinks    Types: 1 Glasses of wine, 1 Cans of beer, 1 Shots of liquor per week    Comment: 1-2/month  . Drug use: No  . Sexual activity: Not on file  Other Topics Concern  . Not on file  Social History Narrative   Married (kids are patients here, wife seen elsewhere). 3 step children, 1 biological. No grandkids.    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 Difficulty of Paying Living Expenses: Not on file  Food Insecurity:   . Worried About Programme researcher, broadcasting/film/videounning Out of Food in the Last Year: Not on file  . Ran Out of Food in the Last Year: Not on file  Transportation Needs:   . Lack of Transportation (Medical): Not on file  . Lack of Transportation (Non-Medical): Not on file  Physical Activity:   . Days of Exercise per Week: Not on file  . Minutes of Exercise per Session: Not on file  Stress:   . Feeling of Stress : Not on file  Social Connections:   . Frequency of Communication with Friends and Family: Not on file  . Frequency of Social Gatherings with Friends and  Family: Not on file  . Attends Religious Services: Not on file  . Active Member of Clubs or Organizations: Not on file  . Attends BankerClub or Organization Meetings: Not on file  . Marital Status: Not on file  Intimate Partner Violence:   . Fear of Current or Ex-Partner: Not on file  . Emotionally Abused: Not on file  . Physically Abused: Not on file  . Sexually Abused: Not on file    Current Outpatient Medications on File Prior to Visit  Medication Sig Dispense Refill  . acetaminophen (TYLENOL) 325 MG tablet Take 2 tablets (650 mg total) by mouth every 4 (four) hours as needed for headache or mild pain.    . carvedilol (COREG) 6.25 MG tablet Take 1 tablet (6.25 mg total) by mouth 2 (two) times daily. 180 tablet 3  . clopidogrel (PLAVIX) 75 MG tablet Take 1 tablet (75 mg total) by mouth daily. 90 tablet 3  . Coenzyme Q10 60 MG CAPS Take 1 capsule by mouth daily.     [name]. ezetimibe (ZETIA) 10 MG tablet TAKE 1 TABLET(10 MG) BY MOUTH DAILY 90 tablet 3  . glucose blood (ONETOUCH VERIO) test strip 1 each by Other route 2 (two) times daily. And lancets 2/day 200 each 12  . nitroGLYCERIN (NITROSTAT) 0.4 MG SL tablet Place 1 tablet (0.4 mg total) under the tongue every 5 (five) minutes as needed for chest pain. Chest pain 30 tablet 5  . Probiotic Product (PROBIOTIC PO) Take 1 capsule by mouth every morning.    . rosuvastatin (CRESTOR) 40 MG tablet TAKE 1 TABLET BY MOUTH EVERY DAY. PLEASE KEEP APPT WITH DISCARD REMAINDER. [place] REFILLS. 90 tablet 3  . sacubitril-valsartan (ENTRESTO) 24-26 MG Take 1 tablet by mouth 2 (two) times daily. 60 tablet 11   No current facility-administered medications on file prior to visit.    No Known Allergies  Family History  Problem Relation Age of Onset  . CAD Father   . Other Mother        passed right before 7790- tumor on kidney recent diagnosis  . Dementia Mother        progressing rapidly  . CAD Paternal Grandfather        8070s  . CAD  Paternal Grandmother        3570s  . Brain cancer Maternal Grandmother   . Lymphoma Sister        b cell  . Celiac disease Sister   . Diabetes Neg Hx     BP 114/70 (BP Location: Right Arm, Patient Position: Sitting, Cuff Size: Normal)   Pulse 64   Ht 5\' 9"  (1.753 m)   Wt 193 lb 9.6 oz (  87.8 kg)   SpO2 97%   BMI 28.59 kg/m    Review of Systems He denies LOC    Objective:   Physical Exam VITAL SIGNS:  See vs page GENERAL: no distress Pulses: dorsalis pedis intact bilat.   MSK: no deformity of the feet CV: no leg edema.   Skin:  no ulcer on the feet.  normal color and temp on the feet.  Neuro: sensation is intact to touch on the feet.   Lab Results  Component Value Date   HGBA1C 8.5 (A) 01/15/2019   Lab Results  Component Value Date   CREATININE 1.36 (H) 11/20/2018   BUN 24 11/20/2018   NA 141 11/20/2018   K 4.0 11/20/2018   CL 105 11/20/2018   CO2 24 11/20/2018       Assessment & Plan:  Type 1 DM, with PAD: worse Hypoglycemia: this limits aggressiveness of glycemic control  Patient Instructions  Please reduce the Basaglar to 36 units each morning, and: Increase the Novolog to 9 units 3 times a day (just before each meal).   check your blood sugar twice a day.  vary the time of day when you check, between before the 3 meals, and at bedtime.  also check if you have symptoms of your blood sugar being too high or too low.  please keep a record of the readings and bring it to your next appointment here (or you can bring the meter itself).  You can write it on any piece of paper.  please call us sooner if your blood sugar goes below 70, or if you have a lot of readings over 200.   Please come back for a follow-up appointment in 2 months.

## 2019-01-15 NOTE — Patient Instructions (Addendum)
Please reduce the Basaglar to 36 units each morning, and: Increase the Novolog to 9 units 3 times a day (just before each meal).   check your blood sugar twice a day.  vary the time of day when you check, between before the 3 meals, and at bedtime.  also check if you have symptoms of your blood sugar being too high or too low.  please keep a record of the readings and bring it to your next appointment here (or you can bring the meter itself).  You can write it on any piece of paper.  please call us sooner if your blood sugar goes below 70, or if you have a lot of readings over 200.   Please come back for a follow-up appointment in 2 months.

## 2019-02-16 NOTE — Progress Notes (Signed)
Virtual Visit via Video Note   This visit type was conducted due to national recommendations for restrictions regarding the COVID-19 Pandemic (e.g. social distancing) in an effort to limit this patient's exposure and mitigate transmission in our community.  Due to his co-morbid illnesses, this patient is at least at moderate risk for complications without adequate follow up.  This format is felt to be most appropriate for this patient at this time.  All issues noted in this document were discussed and addressed.  A limited physical exam was performed with this format.  Please refer to the patient's chart for his consent to telehealth for [facility].   Evaluation Performed:  Follow-up visit  Date:  02/16/2019   ID:  John Carrillo, DOB Mar 11, 1950, MRN [id number]  Patient Location: Home Provider Location: Office  PCP:  [name], MD  Cardiologist:  [name] Electrophysiologist:  None   Chief Complaint:  Ischemic Cardiomyopathy  History of Present Illness:    John Carrillo is a 69 y.o. male with a hx of CAD status post anterior MI 18 years ago treated with stenting of the LAD, ICM, LBBB, prior CVA, HL, DM 2.  He was evaluated by Dr. [name] in 2008 for ICD implantation for primary prevention.  He declined at that time.  Had SEMI 2016 with RCA intervention EF 15% Readmitted 01/23/17 with right occipital lobe CVA placed on Brillinta and Eliquis. 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    Past Medical History:  Diagnosis Date  . Acute kidney injury superimposed on chronic kidney disease (HCC) 01/25/2015  . CORONARY ARTERY DISEASE 08/11/2006  . CVA 12/19/2006   2008  . DIABETES MELLITUS, TYPE I 08/11/2006  . ECZEMA, HANDS 12/15/2009  . HYPERLIPIDEMIA 08/11/2006  . HYPERTENSION 08/11/2006   Past Surgical History:  Procedure Laterality Date  . CARDIAC CATHETERIZATION N/A 01/16/2015   Procedure: Left Heart Cath and Coronary Angiography;  Surgeon: [name], MD;  Location: [facility] INVASIVE CV LAB;  Service: Cardiovascular;  Laterality: N/A;  . CORONARY STENT PLACEMENT    . EP IMPLANTABLE DEVICE N/A 01/10/2016   MDT Claria MRI Quad CRTD implanted by Dr [name]  . TEE WITHOUT CARDIOVERSION N/A 01/26/2015   Procedure: TRANSESOPHAGEAL ECHOCARDIOGRAM (TEE);  Surgeon: [name], MD;  Location: [facility] ENDOSCOPY;  Service: Cardiovascular;  Laterality: N/A;  . TONSILECTOMY, ADENOIDECTOMY, BILATERAL MYRINGOTOMY AND TUBES       No outpatient medications have been marked as taking for the 02/25/19 encounter (Appointment) with Wendall Stade, MD.     Allergies:   Patient has no known allergies.   Social History   Tobacco Use  . Smoking  status: Former Smoker    Packs/day: 1.00    Years: 30.00    Pack years: 30.00    Types: Cigarettes    Quit date: 05/03/2000    Years since quitting: 18.8  . Smokeless tobacco: Never Used  Substance Use Topics  . Alcohol use: Yes    Alcohol/week: 3.0 standard drinks    Types: 1 Glasses of wine, 1 Cans of beer, 1 Shots of liquor per week    Comment: 1-2/month  . Drug use: No     Family Hx: The patient's family history includes Brain cancer in his maternal  grandmother; CAD in his father, paternal grandfather, and paternal grandmother; Celiac disease in his sister; Dementia in his mother; Lymphoma in his sister; Other in his mother. There is no history of Diabetes.  ROS:   Please see the history of present illness.     All other systems reviewed and are negative.   Prior CV studies:   The following studies were reviewed today:  PaceArt 04/13/18 Echo 08/15/17   Labs/Other Tests and Data Reviewed:    EKG:  AV dual pacing QRS 188 msec 02/27/18  Recent Labs: 11/20/2018: BUN 24; Creatinine, Ser 1.36; Potassium 4.0; Sodium 141   Recent Lipid Panel Lab Results  Component Value Date/Time   CHOL 123 02/13/2018 09:03 AM   TRIG 61.0 02/13/2018 09:03 AM   HDL 53.10 02/13/2018 09:03 AM   CHOLHDL 2 02/13/2018 09:03 AM   LDLCALC 57 02/13/2018 09:03 AM   LDLDIRECT 85.0 07/11/2017 08:39 AM    Wt Readings from Last 3 Encounters:  01/15/19 193 lb 9.6 oz (87.8 kg)  11/20/18 189 lb 9.6 oz (86 kg)  08/17/18 185 lb 12.8 oz (84.3 kg)     Objective:    Vital Signs:  There were no vitals taken for this visit.   Well nourished, well developed male in no acute distress. CRT device under left clavicle Skin warm and dry No tachypnea No JVP elevation No edema   ASSESSMENT & PLAN:    1. CHF:  EF 30-35% functional class one continue current meds try to uptitrate entresto to mid dosewill update echo July 2021 which will be 2 years since last  2. CAD:  Post large anterior MI with stenting of LAD 18 years ago and SEMI stenting ?RCA in 2016 no angina continue medical Rx 3. CRT-AICD:  F/u EP Implant 2017 no d/c normal PaceArt review 01/12/19 with no PAF  4. HLD:  Change to zocor due to myalgias  and zetia LDL 57 02/13/18 f/u lipid clinic consider PSK 9 if 3rd statin not tolerated  5. DM:  Discussed low carb diet.  Target hemoglobin A1c is 6.5 or less.  Continue current medications. Last  A1c elevated 8.5 on 01/15/19  6. Thyroid:  On replacement TSH normal  4.2 02/13/18   COVID-19 Education: The signs and symptoms of COVID-19 were discussed with the patient and how to seek care for testing (follow up with PCP or arrange E-visit).  The importance of social distancing was discussed today.  Time:   Today, I have spent 30 minutes with the patient with telehealth technology discussing the above problems.     Medication Adjustments/Labs and Tests Ordered: Current medicines are reviewed at length with the patient today.  Concerns regarding medicines are outlined above.   Tests Ordered: No orders of the defined types were placed in this encounter.   Medication Changes: No orders of the defined types were placed in this encounter.  Disposition:  Follow up in a year   Signed, Charlton Haws, MD  02/16/2019 1:38 PM    [facility]

## 2019-02-17 ENCOUNTER — Telehealth: Payer: Self-pay

## 2019-02-17 NOTE — Telephone Encounter (Signed)
-----   Message from [facility] sent at 02/11/2019  1:36 PM EST ----- Regarding: Reschedule for 03/01/19 02/11/19 LVM to turn appt to virtual per Dr. [name] and Elita Quick

## 2019-02-17 NOTE — Telephone Encounter (Signed)
Left message for patient to call back or mychart our office to change visit to virtual visit.

## 2019-02-17 NOTE — Telephone Encounter (Signed)
Patient called back and switched his appointment to virtual.

## 2019-02-25 ENCOUNTER — Telehealth (INDEPENDENT_AMBULATORY_CARE_PROVIDER_SITE_OTHER): Payer: Managed Care, Other (non HMO) | Admitting: Cardiovascular Disease

## 2019-02-25 ENCOUNTER — Other Ambulatory Visit: Payer: Self-pay

## 2019-02-25 VITALS — Ht 69.0 in | Wt 185.4 lb

## 2019-02-25 DIAGNOSIS — I255 Ischemic cardiomyopathy: Secondary | ICD-10-CM

## 2019-02-25 MED ORDER — SIMVASTATIN 40 MG PO TABS: 40.0000 mg | ORAL_TABLET | Freq: Every day | ORAL | 3 refills | Status: DC

## 2019-02-25 MED ORDER — ENTRESTO 49-51 MG PO TABS: 1.0000 | ORAL_TABLET | Freq: Two times a day (BID) | ORAL | 3 refills | Status: DC

## 2019-02-25 NOTE — Patient Instructions (Signed)
Medication Instructions:  Your physician has recommended you make the following change in your medication:  1- STOP Crestor (Rouvostatin  2-START Simvastatin 40 mg by mouth daily 3-INCREASE Entresto 49/51 mg by mouth twice daily  *If you need a refill on your cardiac medications before your next appointment, please call your pharmacy*  Lab Work: Your physician recommends that you return for lab work in: 8 weeks for fasting lipid and liver panel.  If you have labs (blood work) drawn today and your tests are completely normal, you will receive your results only by: [name] MyChart Message (if you have MyChart) OR . A paper copy in the mail If you have any lab test that is abnormal or we need to change your treatment, we will call you to review the results.  Follow-Up: At [facility], you and your health needs are our priority.  As part of our continuing mission to provide you with exceptional heart care, we have created designated Provider Care Teams.  These Care Teams include your primary Cardiologist (physician) and Advanced Practice Providers (APPs -  Physician Assistants and Nurse Practitioners) who all work together to provide you with the care you need, when you need it.  Your next appointment:   6 month(s)  The format for your next appointment:   Either In Person or Virtual  Provider:   You may see Dr. [name] or one of the following Advanced Practice Providers on your designated Care Team:    Norma Fredrickson, NP   Nada Boozer, NP  Georgie Chard, NP   Other Instructions You have been referred to our Lipid Clinic in 8 weeks. We will call you to schedule the appointment.

## 2019-03-06 ENCOUNTER — Ambulatory Visit: Payer: Managed Care, Other (non HMO)

## 2019-03-11 ENCOUNTER — Ambulatory Visit: Payer: Managed Care, Other (non HMO)

## 2019-03-12 ENCOUNTER — Ambulatory Visit: Payer: Managed Care, Other (non HMO)

## 2019-03-13 ENCOUNTER — Ambulatory Visit: Payer: Managed Care, Other (non HMO) | Attending: Internal Medicine

## 2019-03-13 DIAGNOSIS — Z23 Encounter for immunization: Secondary | ICD-10-CM | POA: Insufficient documentation

## 2019-03-13 NOTE — Progress Notes (Signed)
   Covid-19 Vaccination Clinic  Name:  John Carrillo    MRN: [id number] DOB: January 04, 1951  03/13/2019  [name] was observed post Covid-19 immunization for 15 minutes without incidence. He was provided with Vaccine Information Sheet and instruction to access the V-Safe system.   [name] was instructed to call 911 with any severe reactions post vaccine: [name] Difficulty breathing  . Swelling of your face and throat  . A fast heartbeat  . A bad rash all over your body  . Dizziness and weakness    Immunizations Administered    Name Date Dose VIS Date Route   Pfizer COVID-19 Vaccine 03/13/2019  5:55 PM 0.3 mL 01/15/2019 Intramuscular   Manufacturer: [employer], [employer]   Lot: [id number]   NDC: [address]-3

## 2019-03-26 ENCOUNTER — Ambulatory Visit: Payer: 59 | Admitting: Endocrinology

## 2019-04-07 ENCOUNTER — Ambulatory Visit: Payer: Managed Care, Other (non HMO)

## 2019-04-07 ENCOUNTER — Ambulatory Visit: Payer: Managed Care, Other (non HMO) | Attending: Internal Medicine

## 2019-04-07 DIAGNOSIS — Z23 Encounter for immunization: Secondary | ICD-10-CM | POA: Insufficient documentation

## 2019-04-07 NOTE — Progress Notes (Signed)
   Covid-19 Vaccination Clinic  Name:  John Carrillo    MRN: [id number] DOB: August 04, 1950  04/07/2019  [name] was observed post Covid-19 immunization for 15 minutes without incident. He was provided with Vaccine Information Sheet and instruction to access the V-Safe system.   [name] was instructed to call 911 with any severe reactions post vaccine: [name] Difficulty breathing  . Swelling of face and throat  . A fast heartbeat  . A bad rash all over body  . Dizziness and weakness   Immunizations Administered    Name Date Dose VIS Date Route   Pfizer COVID-19 Vaccine 04/07/2019  3:48 PM 0.3 mL 01/15/2019 Intramuscular   Manufacturer: [employer], [employer]   Lot: [id number]   NDC: [address]-3

## 2019-04-08 NOTE — Progress Notes (Signed)
Patient ID: John Carrillo                 DOB: 1950/12/10                    MRN: [id number]     HPI: LOURDES MANNING is a 69 y.o. male patient referred to lipid clinic by Dr. [name]. PMH is significant for hx of CAD with STEMI in 2016 and 18 years ago s/p PCI to LAD and RCA, ICM s/p CRT-D, LBBB, prior CVA, HLD, CHF, HTN and T2DM. Last TTE on 08/2017 showed improved EF to 30-35%. Patient was last seen via telemedicine visit with Dr. [name] on 02/25/19 where he complained of neck and shoulder pains with Crestor, so he was switched to simvastatin 40 mg daily.  Patient arrives in good spirits today for initial visit. He states that he is still having bilateral shoulder pain that has not changed since switching to simvastatin. States aches are not debilitating. He reports that his blood sugars increased dramatically from 200s to the 400s after starting simvastatin. He states that he would prefer to switch back to rosuvastatin given his high blood sugars. He describes interest in starting a PCSK9 inhibitor if his LDL remains elevated today. He is concerned about the price of this medication due to his high copay for Entresto. 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  Patient has concerns regarding the exacerbation of erectile dysfunction symptoms after increasing Entresto and switching to simvastatin at last visit in January. Discussed with patient that there is a <1% incidence of erectile dysfunction reported with simvastatin and none reported with Entresto.   Current Medications: Simvastatin 40mg  daily, ezetimibe 10mg  daily, CoQ10 60mg  daily  Intolerances: Atorvastatin 20-80mg  daily, rosuvastatin 40mg  daily  Risk Factors: Hx multiple STEMIs, T1DM, HTN, ICM, former smoker, family history  LDL goal: < 55  Diet: His wife is a vegetarian, but he enjoys eating meats and fruit. Yesterday, he ate pumpernickel toast with orange marmalade for breakfast,  apples and pumpernickel bread for lunch, and pork chops with lentils and spinach for dinner. Drinks coffee and decaf iced tea (no sugary drinks).  Exercise: Difficult to exercise while working from home during pandemic. Stays active by walking outside and on the treadmill for 20-30 minutes. Uses light hand weights.   Family History: CAD in father, paternal grandfather, paternal grandmother  Social History: Former smoker (Quit 2002, 1 PPD, 30 pack-years), 3 drinks of alcohol/week  Labs: 02/13/18: TC 123, TG 61, HDL 53.1, LDL 57 (rosuvastatin 40mg  daily, ezetimibe)  Past Medical History:  Diagnosis Date  . Acute kidney injury superimposed on chronic kidney disease (HCC) 01/25/2015  . CORONARY ARTERY DISEASE 08/11/2006  . CVA 12/19/2006   2008  . DIABETES MELLITUS, TYPE I 08/11/2006  . ECZEMA, HANDS 12/15/2009  . HYPERLIPIDEMIA 08/11/2006  . HYPERTENSION 08/11/2006    Current Outpatient Medications on File Prior to Visit  Medication Sig Dispense Refill  . acetaminophen (TYLENOL) 325 MG tablet Take 2 tablets (650 mg total) by mouth every 4 (four) hours as needed for headache or mild pain.    . carvedilol (COREG) 6.25 MG tablet Take 1 tablet (6.25 mg total) by mouth 2 (two) times daily. 180 tablet 3  . clopidogrel (PLAVIX) 75 MG tablet Take 1 tablet (75 mg total) by mouth daily. 90 tablet 3  . Coenzyme Q10 60 MG CAPS Take 1 capsule by mouth daily.     2009  ezetimibe (ZETIA) 10 MG tablet TAKE 1 TABLET(10 MG) BY MOUTH DAILY 90 tablet 3  . glucose blood (ONETOUCH VERIO) test strip 1 each by Other route 2 (two) times daily. And lancets 2/day 200 each 12  . insulin aspart (NOVOLOG FLEXPEN) 100 UNIT/ML FlexPen Inject 9 Units into the skin 3 (three) times daily with meals. 15 mL 11  . Insulin Glargine (BASAGLAR KWIKPEN) 100 UNIT/ML SOPN Inject 0.36 mLs (36 Units total) into the skin every morning. 15 mL 2  . levothyroxine (SYNTHROID) 75 MCG tablet TAKE 1 TABLET(75 MCG) BY MOUTH DAILY BEFORE BREAKFAST 90  tablet 3  . nitroGLYCERIN (NITROSTAT) 0.4 MG SL tablet Place 1 tablet (0.4 mg total) under the tongue every 5 (five) minutes as needed for chest pain. Chest pain 30 tablet 5  . Probiotic Product (PROBIOTIC PO) Take 1 capsule by mouth every morning.    . sacubitril-valsartan (ENTRESTO) 49-51 MG Take 1 tablet by mouth 2 (two) times daily. 180 tablet 3  . simvastatin (ZOCOR) 40 MG tablet Take 1 tablet (40 mg total) by mouth at bedtime. 90 tablet 3   No current facility-administered medications on file prior to visit.    No Known Allergies  Assessment/Plan:  1. Hyperlipidemia - LDL is above goal of < 55. Given elevated blood sugars and continued shoulder pain with simvastatin, will switch simvastatin to rosuvastatin but at a lower dose of 20mg  daily. Continue zetia 10mg  daily. Discussed the importance of regular exercise and maintaining a heart healthy diet. Patient did get labs today, but will also check lipid panel in 2 months since switching therapy. Will consider starting Praluent injections if LDL remain above goal of <55 on repeat. Gave patient a copay card for his Delene Loll to help with cost since he is currently on [employer]. Patient advised to call clinic in 1-2 weeks if his ED does not improve off of simvastatin and we can discuss with Dr. Johnsie Cancel about Delene Loll dosing.  Richardine Service, PharmD PGY1 Pharmacy Resident  Ramond Dial, Pharm.D, BCPS, CPP [facility]  [address] [place], [place] [address]  Phone: [phone]; Fax: [phone]

## 2019-04-09 ENCOUNTER — Other Ambulatory Visit: Payer: Self-pay

## 2019-04-09 ENCOUNTER — Ambulatory Visit (INDEPENDENT_AMBULATORY_CARE_PROVIDER_SITE_OTHER): Payer: Managed Care, Other (non HMO) | Admitting: Pharmacist

## 2019-04-09 ENCOUNTER — Other Ambulatory Visit: Payer: Managed Care, Other (non HMO) | Admitting: *Deleted

## 2019-04-09 DIAGNOSIS — E785 Hyperlipidemia, unspecified: Secondary | ICD-10-CM

## 2019-04-09 DIAGNOSIS — I255 Ischemic cardiomyopathy: Secondary | ICD-10-CM

## 2019-04-09 LAB — LIPID PANEL
Chol/HDL Ratio: 3 ratio (ref 0.0–5.0)
Cholesterol, Total: 142 mg/dL (ref 100–199)
HDL: 48 mg/dL (ref >39)
LDL Chol Calc (NIH): 75 mg/dL (ref 0–99)
Triglycerides: 100 mg/dL (ref 0–149)
VLDL Cholesterol Cal: 19 mg/dL (ref 5–40)

## 2019-04-09 LAB — HEPATIC FUNCTION PANEL
ALT: 27 IU/L (ref 0–44)
AST: 26 IU/L (ref 0–40)
Albumin: 4.1 g/dL (ref 3.8–4.8)
Alkaline Phosphatase: 66 IU/L (ref 39–117)
Bilirubin Total: 0.3 mg/dL (ref 0.0–1.2)
Bilirubin, Direct: 0.1 mg/dL (ref 0.00–0.40)
Total Protein: 6.1 g/dL (ref 6.0–8.5)

## 2019-04-09 MED ORDER — ROSUVASTATIN CALCIUM 20 MG PO TABS: 20.0000 mg | ORAL_TABLET | Freq: Every day | ORAL | 3 refills | Status: DC

## 2019-04-09 NOTE — Patient Instructions (Addendum)
It was nice to meet you today   Your LDL goal is < 55.  Stop taking simvastatin  Start taking rosuvastatin 20 mg daily  Continue taking your ezetimibe 10mg  daily and CoQ10 60mg  daily  Please call at [phone] if you have any questions

## 2019-04-13 ENCOUNTER — Ambulatory Visit (INDEPENDENT_AMBULATORY_CARE_PROVIDER_SITE_OTHER): Payer: Managed Care, Other (non HMO) | Admitting: *Deleted

## 2019-04-13 DIAGNOSIS — I255 Ischemic cardiomyopathy: Secondary | ICD-10-CM

## 2019-04-13 LAB — CUP PACEART REMOTE DEVICE CHECK
Battery Remaining Longevity: 73 mo
Battery Voltage: 2.99 V
Brady Statistic AP VP Percent: 18.2 %
Brady Statistic AP VS Percent: 0.32 %
Brady Statistic AS VP Percent: 80.19 %
Brady Statistic AS VS Percent: 1.29 %
Brady Statistic RA Percent Paced: 18.45 %
Brady Statistic RV Percent Paced: 17.41 %
Date Time Interrogation Session: 20210309033524
HighPow Impedance: 66 Ohm
Implantable Lead Implant Date: 20171206
Implantable Lead Implant Date: 20171206
Implantable Lead Implant Date: 20171206
Implantable Lead Location: 753858
Implantable Lead Location: 753859
Implantable Lead Location: 753860
Implantable Lead Model: 4598
Implantable Lead Model: 5076
Implantable Pulse Generator Implant Date: 20171206
Lead Channel Impedance Value: 171 Ohm
Lead Channel Impedance Value: 184.154
Lead Channel Impedance Value: 184.154
Lead Channel Impedance Value: 184.154
Lead Channel Impedance Value: 199.5 Ohm
Lead Channel Impedance Value: 304 Ohm
Lead Channel Impedance Value: 342 Ohm
Lead Channel Impedance Value: 342 Ohm
Lead Channel Impedance Value: 380 Ohm
Lead Channel Impedance Value: 399 Ohm
Lead Channel Impedance Value: 399 Ohm
Lead Channel Impedance Value: 399 Ohm
Lead Channel Impedance Value: 399 Ohm
Lead Channel Impedance Value: 627 Ohm
Lead Channel Impedance Value: 646 Ohm
Lead Channel Impedance Value: 665 Ohm
Lead Channel Impedance Value: 665 Ohm
Lead Channel Impedance Value: 703 Ohm
Lead Channel Pacing Threshold Amplitude: 0.375 V
Lead Channel Pacing Threshold Amplitude: 1 V
Lead Channel Pacing Threshold Amplitude: 1 V
Lead Channel Pacing Threshold Pulse Width: 0.4 ms
Lead Channel Pacing Threshold Pulse Width: 0.4 ms
Lead Channel Pacing Threshold Pulse Width: 0.4 ms
Lead Channel Sensing Intrinsic Amplitude: 14.75 mV
Lead Channel Sensing Intrinsic Amplitude: 14.75 mV
Lead Channel Sensing Intrinsic Amplitude: 3.375 mV
Lead Channel Sensing Intrinsic Amplitude: 3.375 mV
Lead Channel Setting Pacing Amplitude: 1.5 V
Lead Channel Setting Pacing Amplitude: 2 V
Lead Channel Setting Pacing Amplitude: 2.5 V
Lead Channel Setting Pacing Pulse Width: 0.4 ms
Lead Channel Setting Pacing Pulse Width: 0.4 ms
Lead Channel Setting Sensing Sensitivity: 0.3 mV

## 2019-04-14 NOTE — Progress Notes (Signed)
ICD Remote  

## 2019-04-21 ENCOUNTER — Other Ambulatory Visit: Payer: Self-pay

## 2019-04-23 ENCOUNTER — Encounter: Payer: Self-pay | Admitting: Endocrinology

## 2019-04-23 ENCOUNTER — Ambulatory Visit (INDEPENDENT_AMBULATORY_CARE_PROVIDER_SITE_OTHER): Payer: Managed Care, Other (non HMO) | Admitting: Endocrinology

## 2019-04-23 ENCOUNTER — Other Ambulatory Visit: Payer: Self-pay

## 2019-04-23 VITALS — BP 104/60 | HR 73 | Ht 69.0 in | Wt 190.0 lb

## 2019-04-23 DIAGNOSIS — E1122 Type 2 diabetes mellitus with diabetic chronic kidney disease: Secondary | ICD-10-CM

## 2019-04-23 DIAGNOSIS — E1051 Type 1 diabetes mellitus with diabetic peripheral angiopathy without gangrene: Secondary | ICD-10-CM

## 2019-04-23 LAB — POCT GLYCOSYLATED HEMOGLOBIN (HGB A1C): Hemoglobin A1C: 9.4 % — AB (ref 4.0–5.6)

## 2019-04-23 MED ORDER — FREESTYLE LIBRE 14 DAY SENSOR MISC: 1.0000 | 3 refills | Status: DC

## 2019-04-23 MED ORDER — FREESTYLE LIBRE 14 DAY READER DEVI: 1.0000 | Freq: Once | 3 refills | Status: AC

## 2019-04-23 MED ORDER — LANTUS SOLOSTAR 100 UNIT/ML ~~LOC~~ SOPN: 30.0000 [IU] | PEN_INJECTOR | SUBCUTANEOUS | 99 refills | Status: DC

## 2019-04-23 NOTE — Progress Notes (Signed)
Subjective:    Patient ID: John Carrillo, male    DOB: 1950/08/08, 69 y.o.   MRN: [id number]  HPI Pt returns for f/u of diabetes mellitus:  DM type: 1 Dx'ed: 2008. Complications: CVA, PAD, renal insufficiency, and CAD.   Therapy: insulin since soon after dx.  DKA: never.  Severe hypoglycemia: never.   Pancreatitis: never.   Other: he declines pump, including V-GO; he takes multiple daily injections, but basal insulin is emphasized, after poor results with usual multiple daily injections regimen; He says his ability to care for his DM has been compromised by wife's illness.   Interval history:  Pt says he seldom misses the insulin. pt states he feels well in general.  Pt says cbg's were high a few weeks ago, for uncertain reason.  since then, cbg's vary from 70-158.  There is no trend throughout the day. Pt says Basaglar is 30/d, but ins now prefers Lantus.   Past Medical History:  Diagnosis Date  . Acute kidney injury superimposed on chronic kidney disease (HCC) 01/25/2015  . CORONARY ARTERY DISEASE 08/11/2006  . CVA 12/19/2006   2008  . DIABETES MELLITUS, TYPE I 08/11/2006  . ECZEMA, HANDS 12/15/2009  . HYPERLIPIDEMIA 08/11/2006  . HYPERTENSION 08/11/2006    Past Surgical History:  Procedure Laterality Date  . CARDIAC CATHETERIZATION N/A 01/16/2015   Procedure: Left Heart Cath and Coronary Angiography;  Surgeon: [name], MD;  Location[facility] INVASIVE CV LAB;  Service: Cardiovascular;  Laterality: N/A;  . CORONARY STENT PLACEMENT    . EP IMPLANTABLE DEVICE N/A 01/10/2016   MDT Claria MRI Quad CRTD implanted by Dr [name]  . TEE WITHOUT CARDIOVERSION N/A 01/26/2015   Procedure: TRANSESOPHAGEAL ECHOCARDIOGRAM (TEE);  Surgeon: [name], MD;  Location: [facility] ENDOSCOPY;  Service: Cardiovascular;  Laterality: N/A;  . TONSILECTOMY, ADENOIDECTOMY, BILATERAL MYRINGOTOMY AND TUBES      Social History   Socioeconomic History  . Marital status: Married    Spouse name: Not on file    . Number of children: Not on file  . Years of education: Not on file  . Highest education level: Not on file  Occupational History  . Occupation: Engineer, maintenance: [employer]  Tobacco Use  . Smoking status: Former Smoker    Packs/day: 1.00    Years: 30.00    Pack years: 30.00    Types: Cigarettes    Quit date: 05/03/2000    Years since quitting: 18.9  . Smokeless tobacco: Never Used  Substance and Sexual Activity  . Alcohol use: Yes    Alcohol/week: 3.0 standard drinks    Types: 1 Glasses of wine, 1 Cans of beer, 1 Shots of liquor per week    Comment: 1-2/month  . Drug use: No  . Sexual activity: Not on file  Other Topics Concern  . Not on file  Social History Narrative   Married (kids are patients here, wife seen elsewhere). 3 step children, 1 biological. No grandkids.    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 Difficulty of Paying Living Expenses:   Food Insecurity:   . Worried About Charity fundraiser in the Last Year:   . Arboriculturist in the Last Year:   Transportation Needs:   . Film/video editor (Medical):   [name] Lack of Transportation (Non-Medical):   Physical Activity:   . Days of Exercise per Week:   . Minutes of Exercise per Session:   Stress:   . Feeling of Stress :   Social Connections:   . Frequency of Communication with Friends and Family:   . Frequency of Social Gatherings with Friends and Family:   . Attends Religious Services:   . Active Member of Clubs or Organizations:   . Attends Theatre manager Meetings:   [name] Marital Status:   Intimate Partner Violence:   . Fear of Current or Ex-Partner:   . Emotionally Abused:   [name] Physically Abused:   . Sexually Abused:     Current Outpatient Medications on File Prior to Visit  Medication Sig Dispense Refill  . acetaminophen (TYLENOL) 325 MG tablet Take 2 tablets (650 mg total) by mouth every 4 (four) hours as needed for headache or mild pain.    . carvedilol (COREG) 6.25 MG tablet Take 1 tablet (6.25 mg total) by mouth 2 (two) times daily. 180 tablet 3  . clopidogrel (PLAVIX) 75 MG tablet Take 1 tablet (75 mg total) by mouth daily. 90 tablet 3  . Coenzyme Q10 60 MG CAPS Take 1 capsule by mouth daily.     [name] ezetimibe (ZETIA) 10 MG tablet TAKE 1 TABLET(10 MG) BY MOUTH DAILY 90 tablet 3  . glucose blood (ONETOUCH VERIO) test strip 1 each by Other route 2 (two) times daily. And lancets 2/day 200 each 12  . insulin aspart (NOVOLOG FLEXPEN) 100 UNIT/ML FlexPen Inject 9 Units into the skin 3 (three) times daily with meals. 15 mL 11  . levothyroxine (SYNTHROID) 75 MCG tablet TAKE 1 TABLET(75 MCG) BY MOUTH DAILY BEFORE BREAKFAST 90 tablet 3  . Probiotic Product (PROBIOTIC PO) Take 1 capsule by mouth every morning.    . rosuvastatin (CRESTOR) 20 MG tablet Take 1 tablet (20 mg total) by mouth daily. 90 tablet 3  . sacubitril-valsartan (ENTRESTO) 49-51 MG Take 1 tablet by mouth 2 (two) times daily. 180 tablet 3   No current facility-administered medications on file prior to visit.    No Known Allergies  Family History  Problem Relation Age of Onset  . CAD Father   . Other Mother        passed right before 66- tumor on kidney recent diagnosis  . Dementia Mother        progressing rapidly  . CAD Paternal Grandfather        73s  . CAD Paternal Grandmother        66s  . Brain cancer Maternal Grandmother   . Lymphoma Sister        b cell  . Celiac disease Sister   . Diabetes Neg Hx     BP 104/60   Pulse 73   Ht 5\' 9"  (1.753 m)    Wt 190 lb (86.2 kg)   SpO2 94%   BMI 28.06 kg/m   Review of Systems He denies LOC    Objective:   Physical Exam VITAL SIGNS:  See vs page GENERAL: no distress Pulses: dorsalis pedis intact bilat.   MSK: no deformity  of the feet CV: no leg edema Skin:  no ulcer on the feet.  normal color and temp on the feet. Neuro: sensation is intact to touch on the feet.    Lab Results  Component Value Date   HGBA1C 9.4 (A) 04/23/2019       Assessment & Plan:  Type 1 DM, with PAD: worse.  Hypoglycemia: this limits aggressiveness of glycemic control.   Patient Instructions  I have sent a prescription to your pharmacy, for the continuous glucose monitor I have also sent a prescription to your pharmacy, to change the Basaglar to Lantus. Please continue the same other medications.  Please come back for a follow-up appointment in 2 months.

## 2019-04-23 NOTE — Patient Instructions (Addendum)
I have sent a prescription to your pharmacy, for the continuous glucose monitor I have also sent a prescription to your pharmacy, to change the Basaglar to Lantus. Please continue the same other medications.  Please come back for a follow-up appointment in 2 months.

## 2019-05-06 ENCOUNTER — Other Ambulatory Visit: Payer: Self-pay | Admitting: Endocrinology

## 2019-05-14 ENCOUNTER — Ambulatory Visit (INDEPENDENT_AMBULATORY_CARE_PROVIDER_SITE_OTHER): Payer: Managed Care, Other (non HMO) | Admitting: Family Medicine

## 2019-05-14 ENCOUNTER — Encounter: Payer: Self-pay | Admitting: Family Medicine

## 2019-05-14 ENCOUNTER — Other Ambulatory Visit: Payer: Self-pay

## 2019-05-14 VITALS — BP 110/70 | HR 60 | Temp 97.8°F | Ht 69.0 in | Wt 195.4 lb

## 2019-05-14 DIAGNOSIS — E1051 Type 1 diabetes mellitus with diabetic peripheral angiopathy without gangrene: Secondary | ICD-10-CM

## 2019-05-14 DIAGNOSIS — K4090 Unilateral inguinal hernia, without obstruction or gangrene, not specified as recurrent: Secondary | ICD-10-CM | POA: Diagnosis not present

## 2019-05-14 DIAGNOSIS — E039 Hypothyroidism, unspecified: Secondary | ICD-10-CM

## 2019-05-14 DIAGNOSIS — I10 Essential (primary) hypertension: Secondary | ICD-10-CM | POA: Diagnosis not present

## 2019-05-14 DIAGNOSIS — N529 Male erectile dysfunction, unspecified: Secondary | ICD-10-CM

## 2019-05-14 DIAGNOSIS — I5022 Chronic systolic (congestive) heart failure: Secondary | ICD-10-CM | POA: Diagnosis not present

## 2019-05-14 DIAGNOSIS — Z8673 Personal history of transient ischemic attack (TIA), and cerebral infarction without residual deficits: Secondary | ICD-10-CM

## 2019-05-14 DIAGNOSIS — I2510 Atherosclerotic heart disease of native coronary artery without angina pectoris: Secondary | ICD-10-CM

## 2019-05-14 DIAGNOSIS — Z9861 Coronary angioplasty status: Secondary | ICD-10-CM

## 2019-05-14 DIAGNOSIS — E785 Hyperlipidemia, unspecified: Secondary | ICD-10-CM

## 2019-05-14 LAB — COMPREHENSIVE METABOLIC PANEL
ALT: 50 U/L (ref 0–53)
AST: 40 U/L — ABNORMAL HIGH (ref 0–37)
Albumin: 4.1 g/dL (ref 3.5–5.2)
Alkaline Phosphatase: 57 U/L (ref 39–117)
BUN: 14 mg/dL (ref 6–23)
CO2: 30 mEq/L (ref 19–32)
Calcium: 9.2 mg/dL (ref 8.4–10.5)
Chloride: 106 mEq/L (ref 96–112)
Creatinine, Ser: 1.18 mg/dL (ref 0.40–1.50)
GFR: 61.44 mL/min (ref >60.00)
Glucose, Bld: 85 mg/dL (ref 70–99)
Potassium: 4.6 mEq/L (ref 3.5–5.1)
Sodium: 143 mEq/L (ref 135–145)
Total Bilirubin: 0.6 mg/dL (ref 0.2–1.2)
Total Protein: 6.4 g/dL (ref 6.0–8.3)

## 2019-05-14 LAB — TSH: TSH: 6.55 u[IU]/mL — ABNORMAL HIGH (ref 0.35–4.50)

## 2019-05-14 MED ORDER — TADALAFIL 5 MG PO TABS: 5.0000 mg | ORAL_TABLET | Freq: Every day | ORAL | 5 refills | Status: DC | PRN

## 2019-05-14 NOTE — Patient Instructions (Addendum)
Health Maintenance Due  Topic Date Due  . OPHTHALMOLOGY EXAM -has one scheduled- have them send [place] a copy 12/20/2017   We will call you within two weeks about your referral to general surgery. If you do not hear within 3 weeks, give [place] a call.   Try cialis 5mg . You may see benefit for up to 3 days after taking but technically you can take daily if needed. If this is expensive we can try 20mg  and cut in half but per epic your insurance seemed to like the 5 mg version.   Recommended follow up: next available physical in 3-6 months

## 2019-05-14 NOTE — Progress Notes (Signed)
Phone [phone] In person visit   Subjective:   John Carrillo is a 69 y.o. year old very pleasant male patient who presents for/with See problem oriented charting Chief Complaint  Patient presents with  . Follow-up  . discuss hernia repair   This visit occurred during the SARS-CoV-2 public health emergency.  Safety protocols were in place, including screening questions prior to the visit, additional usage of staff PPE, and extensive cleaning of exam room while observing appropriate contact time as indicated for disinfecting solutions.   Past Medical History-  Patient Active Problem List   Diagnosis Date Noted  . Diabetes (HCC) 01/25/2015    Priority: High  . Chronic systolic CHF (congestive heart failure) (HCC) 01/25/2015    Priority: High  . Cardiomyopathy, ischemic 01/17/2015    Priority: High  . History of CVA (cerebrovascular accident) 12/19/2006    Priority: High  . CAD S/P LAD PCI'03, RCA DES 01/16/15 08/11/2006    Priority: High  . Left groin hernia 02/13/2018    Priority: Medium  . Hypothyroidism 02/17/2015    Priority: Medium  . CKD (chronic kidney disease), stage III 01/17/2015    Priority: Medium  . Dyslipidemia 08/11/2006    Priority: Medium  . Essential hypertension 08/11/2006    Priority: Medium  . Allergic rhinitis 07/11/2017    Priority: Low  . Epidermoid cyst of ear 10/22/2016    Priority: Low  . LBBB (left bundle branch block) 01/17/2015    Priority: Low  . SVT (supraventricular tachycardia) (HCC) 01/14/2015    Priority: Low  . Former smoker 06/24/2014    Priority: Low  . Eczema of both hands 12/15/2009    Priority: Low  . Erectile dysfunction 05/14/2019  . Chronic systolic dysfunction of left ventricle 01/10/2016    Medications- reviewed and updated Current Outpatient Medications  Medication Sig Dispense Refill  . acetaminophen (TYLENOL) 325 MG tablet Take 2 tablets (650 mg total) by mouth every 4 (four) hours as needed for headache or  mild pain.    . carvedilol (COREG) 6.25 MG tablet Take 1 tablet (6.25 mg total) by mouth 2 (two) times daily. 180 tablet 3  . clopidogrel (PLAVIX) 75 MG tablet Take 1 tablet (75 mg total) by mouth daily. 90 tablet 3  . Coenzyme Q10 60 MG CAPS Take 1 capsule by mouth daily.     . Continuous Blood Gluc Sensor (FREESTYLE LIBRE 14 DAY SENSOR) MISC 1 Device by Does not apply route every 14 (fourteen) days. 6 each 3  . ezetimibe (ZETIA) 10 MG tablet TAKE 1 TABLET(10 MG) BY MOUTH DAILY 90 tablet 3  . glucose blood (ONETOUCH VERIO) test strip 1 each by Other route 2 (two) times daily. And lancets 2/day 200 each 12  . insulin glargine (LANTUS SOLOSTAR) 100 UNIT/ML Solostar Pen Inject 30 Units into the skin every morning. 10 pen PRN  . levothyroxine (SYNTHROID) 75 MCG tablet TAKE 1 TABLET(75 MCG) BY MOUTH DAILY BEFORE BREAKFAST 90 tablet 3  . NOVOLOG FLEXPEN 100 UNIT/ML FlexPen INJECT 6 UNITS UNDER THE SKIN THREE TIMES DAILY WITH MEALS 15 mL 6  . Probiotic Product (PROBIOTIC PO) Take 1 capsule by mouth every morning.    . rosuvastatin (CRESTOR) 20 MG tablet Take 1 tablet (20 mg total) by mouth daily. 90 tablet 3  . sacubitril-valsartan (ENTRESTO) 49-51 MG Take 1 tablet by mouth 2 (two) times daily. 180 tablet 3  . tadalafil (CIALIS) 5 MG tablet Take 1 tablet (5 mg total) by mouth daily as  needed for erectile dysfunction. 10 tablet 5   No current facility-administered medications for this visit.     Objective:  BP 110/70   Pulse 60   Temp 97.8 F (36.6 C)   Ht 5\' 9"  (1.753 m)   Wt 195 lb 6.4 oz (88.6 kg)   SpO2 95%   BMI 28.86 kg/m  Gen: NAD, resting comfortably CV: RRR no murmurs rubs or gallops Lungs: CTAB no crackles, wheeze, rhonchi Ext: no edema Msk: left groin hernia- prominent and worse with valsalva Diabetic Foot Exam - Simple   Simple Foot Form Diabetic Foot exam was performed with the following findings: Yes 05/14/2019 10:40 AM  Visual Inspection No deformities, no ulcerations, no  other skin breakdown bilaterally: Yes Sensation Testing Intact to touch and monofilament testing bilaterally: Yes Pulse Check Posterior Tibialis and Dorsalis pulse intact bilaterally: Yes Comments Pulse stronger PT than DP       Assessment and Plan  Discuss Hernia Repair S: pt c/o left inguinal hernia and he would like to have it repaired. Had considered last year but covid got in the way A/P: prominent hernia in left inguinal area noted. Refer to general surgery- he will need endocrine and cardiology referral   #Chronic medical illnesses 1.  Diabetes mellitus-follows closely with Dr. Phillips Climes was above goal on last check and would need endocrinology clearance before surgery.  Poor control-hopeful to change to Basaglar from Lantus.[name] ordered continuous glucose monitoring- has not started yet.  - his numbers went up to 400s on simvastatin- now off and seems to be doing much better. Sugars have been trending down- 147 this morning for example- sounds like improving and I think if could get a1c 8.5 or less would be reasonable for surgery Lab Results  Component Value Date   HGBA1C 9.4 (A) 04/23/2019   HGBA1C 8.5 (A) 01/15/2019   HGBA1C 8.3 (A) 08/17/2018  2.  Hyperlipidemia-patient recently changed by cardiology to rosuvastatinfrom simvastatin due to myalgias and shoulders-he was continued on Zetia.  Plan was to consider Praluent if LDL is above 55 and appears was 75 on most recent check- Dr. Johnsie Cancel thought the 3 was reasonable and patient prefers not to do injectable option anyway- so wil continue current therapy- no barrier for surgery  Lab Results  Component Value Date   CHOL 142 04/09/2019   HDL 48 04/09/2019   LDLCALC 75 04/09/2019   LDLDIRECT 85.0 07/11/2017   TRIG 100 04/09/2019   CHOLHDL 3.0 04/09/2019  3 CAD-follows with Dr. Johnsie Cancel.  Patient status post stents over 18 years ago and more recently in 2016.  Patient is angina free-would need cardiology clearance.  Patient does  have a history of CVA and is on Plavix-there would be some increased risk of stroke coming off of this but I suspect benefits outweigh the risks  4.  CHF-patient with EF of 30 to 35%-cardiology is working on Programmer, applications.  Patient also is compliant on carvedilol.  Blood pressure looks to be tolerating this.  Plan for repeat echocardiogram July 2021-they may want this done before surgery- but I will defer to them - seems to be active and doing well so may not be required . No signs of fluid overload. Has had weight gain but notes that happened with covid 19 and does not feel fluid overloaded/short of breath 5.  Hypothyroidism-well-controlled on last check but needs an updated TSH-continue current medications and update TSH  Lab Results  Component Value Date   TSH 4.27 02/13/2018  6.  Hypertension well controlled on coreg and entresto 7. CKD stage III- hoping GFR is stable- update today. Avoids nsaids 8.  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   # Erectile dysfunction S:simvastatin and higher entresto caused issues with ED. He came off simvastatin and back onto rosuvastation with no reduction in entresto and ED issues improved.   Despite that continues to have issues- hard to get fully erect- states like 75% full.   Cardiology did not have issuew ith him taking medicine as long as didn't take with nitroglycerin but he has not taken nitroglycerin in last 5 years nad is chest pain free.   A/P: I think a trial of viagra/generic sildenafil would be reasonable - ultimately we opted for cialis as looks like cheaper on his insurance. Will trial 5 mg as that looks preferred on insurance- if not will send in 20mg  and he can start with half tablet- he understands to take half of 20mg  if we have to start with it  Recommended follow up: next available physical in 3-6 months Future Appointments  Date Time Provider Department  Center  06/04/2019  9:15 AM [facility]  06/18/2019  7:30 AM 06/06/2019, MD [facility] None  07/13/2019  8:05 AM [facility] DEVICE REMOTES [facility]  10/12/2019  8:05 AM [facility] DEVICE REMOTES [facility]  01/11/2020  8:05 AM [facility] DEVICE REMOTES [facility]    Lab/Order associations:   ICD-10-CM   1. Left inguinal hernia  K40.90 Ambulatory referral to General Surgery  2. Chronic systolic CHF (congestive heart failure) (HCC)  I50.22   3. Essential hypertension  I10 Comprehensive metabolic panel  4. Hypothyroidism, unspecified type  E03.9 TSH  5. Dyslipidemia  E78.5   6. CAD S/P LAD PCI'03, RCA DES 01/16/15  I25.10    Z98.61   7. History of CVA (cerebrovascular accident)  Z86.73   8. Type 1 diabetes mellitus with diabetic peripheral angiopathy without gangrene (HCC)  E10.51   9. Erectile dysfunction, unspecified erectile dysfunction type  N52.9     Meds ordered this encounter  Medications  . tadalafil (CIALIS) 5 MG tablet    Sig: Take 1 tablet (5 mg total) by mouth daily as needed for erectile dysfunction.    Dispense:  10 tablet    Refill:  5     Return precautions advised.  14/08/2019, MD

## 2019-05-14 NOTE — Assessment & Plan Note (Signed)
#   Erectile dysfunction S:simvastatin and higher entresto caused issues with ED. He came off simvastatin and back onto rosuvastation with no reduction in entresto and ED issues improved.   Despite that continues to have issues- hard to get fully erect- states like 75% full.   Cardiology did not have issuew ith him taking medicine as long as didn't take with nitroglycerin but he has not taken nitroglycerin in last 5 years nad is chest pain free.   A/P: I think a trial of viagra/generic sildenafil would be reasonable - ultimately we opted for cialis as looks like cheaper on his insurance. Will trial 5 mg as that looks preferred on insurance- if not will send in 20mg  and he can start with half tablet- he understands to take half of 20mg  if we have to start with it

## 2019-05-15 NOTE — Progress Notes (Signed)
Notes from Dr. Durene Cal Kidney and liver largely normal.  1 liver function test just slightly off-healthy eating/regular exercise/weight loss can helpYour thyroid was slightly off-are you consistently taking your medicine every morning 30 minutes before other food or beverage other than what you drink to take the pill?  If not-please be consistent with this.  If you are already consistent with this team please increase his levothyroxine to 88 mcg and order repeat TSH under hypothyroidism for 6-week recheck and help set him up with [place]

## 2019-05-17 ENCOUNTER — Other Ambulatory Visit: Payer: Self-pay

## 2019-05-17 DIAGNOSIS — E039 Hypothyroidism, unspecified: Secondary | ICD-10-CM

## 2019-05-17 MED ORDER — LEVOTHYROXINE SODIUM 88 MCG PO TABS: 88.0000 ug | ORAL_TABLET | Freq: Every day | ORAL | 3 refills | Status: DC

## 2019-06-04 ENCOUNTER — Other Ambulatory Visit: Payer: Self-pay

## 2019-06-04 ENCOUNTER — Other Ambulatory Visit: Payer: Managed Care, Other (non HMO) | Admitting: *Deleted

## 2019-06-04 DIAGNOSIS — E785 Hyperlipidemia, unspecified: Secondary | ICD-10-CM

## 2019-06-04 LAB — HEPATIC FUNCTION PANEL
ALT: 55 IU/L — ABNORMAL HIGH (ref 0–44)
AST: 37 IU/L (ref 0–40)
Albumin: 4 g/dL (ref 3.8–4.8)
Alkaline Phosphatase: 61 IU/L (ref 39–117)
Bilirubin Total: 0.5 mg/dL (ref 0.0–1.2)
Bilirubin, Direct: 0.15 mg/dL (ref 0.00–0.40)
Total Protein: 6.3 g/dL (ref 6.0–8.5)

## 2019-06-04 LAB — LIPID PANEL
Chol/HDL Ratio: 3 ratio (ref 0.0–5.0)
Cholesterol, Total: 130 mg/dL (ref 100–199)
HDL: 44 mg/dL (ref >39)
LDL Chol Calc (NIH): 64 mg/dL (ref 0–99)
Triglycerides: 121 mg/dL (ref 0–149)
VLDL Cholesterol Cal: 22 mg/dL (ref 5–40)

## 2019-06-07 ENCOUNTER — Telehealth: Payer: Self-pay | Admitting: Pharmacist

## 2019-06-07 NOTE — Telephone Encounter (Signed)
Spoke with patient. LDL good at 64. Continue rosuvastatin 20mg  daily and ezetimibe 10mg  daily. No issues currently. ALT minimally elevated. Recheck labs in 6 months. Patient in agreement.

## 2019-06-16 ENCOUNTER — Other Ambulatory Visit: Payer: Self-pay

## 2019-06-18 ENCOUNTER — Other Ambulatory Visit: Payer: Self-pay

## 2019-06-18 ENCOUNTER — Ambulatory Visit (INDEPENDENT_AMBULATORY_CARE_PROVIDER_SITE_OTHER): Payer: Managed Care, Other (non HMO) | Admitting: Endocrinology

## 2019-06-18 ENCOUNTER — Encounter: Payer: Self-pay | Admitting: Endocrinology

## 2019-06-18 VITALS — BP 100/70 | HR 63 | Ht 69.0 in | Wt 190.0 lb

## 2019-06-18 DIAGNOSIS — E1122 Type 2 diabetes mellitus with diabetic chronic kidney disease: Secondary | ICD-10-CM | POA: Diagnosis not present

## 2019-06-18 DIAGNOSIS — N183 Chronic kidney disease, stage 3 unspecified: Secondary | ICD-10-CM

## 2019-06-18 DIAGNOSIS — Z794 Long term (current) use of insulin: Secondary | ICD-10-CM | POA: Diagnosis not present

## 2019-06-18 DIAGNOSIS — E1051 Type 1 diabetes mellitus with diabetic peripheral angiopathy without gangrene: Secondary | ICD-10-CM | POA: Diagnosis not present

## 2019-06-18 LAB — POCT GLYCOSYLATED HEMOGLOBIN (HGB A1C): Hemoglobin A1C: 9.3 % — AB (ref 4.0–5.6)

## 2019-06-18 MED ORDER — LANTUS SOLOSTAR 100 UNIT/ML ~~LOC~~ SOPN: 32.0000 [IU] | PEN_INJECTOR | SUBCUTANEOUS | 99 refills | Status: DC

## 2019-06-18 MED ORDER — NOVOLOG FLEXPEN 100 UNIT/ML ~~LOC~~ SOPN: 9.0000 [IU] | PEN_INJECTOR | Freq: Three times a day (TID) | SUBCUTANEOUS | 6 refills | Status: DC

## 2019-06-18 NOTE — Patient Instructions (Addendum)
Please increase the Lantus to 32 units daily.  Please continue the same other medications.  check your blood sugar twice a day.  vary the time of day when you check, between before the 3 meals, and at bedtime.  also check if you have symptoms of your blood sugar being too high or too low.  please keep a record of the readings and bring it to your next appointment here (or you can bring the meter itself).  You can write it on any piece of paper.  please call us sooner if your blood sugar goes below 70, or if you have a lot of readings over 200. Please come back for a follow-up appointment in 3 months.

## 2019-06-18 NOTE — Progress Notes (Signed)
Subjective:    Patient ID: John Carrillo, male    DOB: [birth year], 69 y.o.   MRN: [id number]  HPI Pt returns for f/u of diabetes mellitus:  DM type: Insulin-requiring type 2.   Dx'ed: 2008. Complications: CVA, PAD, renal insufficiency, and CAD.   Therapy: insulin since soon after dx.  DKA: never.  Severe hypoglycemia: never.   Pancreatitis: never.   Other: he declines pump, including V-GO; he takes multiple daily injections, but basal insulin is emphasized, after poor results with usual multiple daily injections regimen; he declines GLP   Interval history:  Pt says he seldom misses the insulin.  He has not yet started continuous glucose monitor.  He seldom has hypoglycemia, and these episodes are mild.  He takes novolog 9 units 3 times a day (just before each meal), and Lantus 30/d. no cbg record, but states cbg's vary from 110-200's.  There is no trend throughout the day.  Past Medical History:  Diagnosis Date  . Acute kidney injury superimposed on chronic kidney disease ([place]) 01/25/2015  . CORONARY ARTERY DISEASE 08/11/2006  . CVA 12/19/2006   2008  . DIABETES MELLITUS, TYPE I 08/11/2006  . ECZEMA, HANDS 12/15/2009  . HYPERLIPIDEMIA 08/11/2006  . HYPERTENSION 08/11/2006    Past Surgical History:  Procedure Laterality Date  . CARDIAC CATHETERIZATION N/A 01/16/2015   Procedure: Left Heart Cath and Coronary Angiography;  Surgeon: Lorretta Harp, MD;  Location: [facility];  Service: Cardiovascular;  Laterality: N/A;  . CORONARY STENT PLACEMENT    . EP IMPLANTABLE DEVICE N/A 01/10/2016   MDT Claria MRI Quad CRTD implanted by Dr Rayann Heman  . TEE WITHOUT CARDIOVERSION N/A 01/26/2015   Procedure: TRANSESOPHAGEAL ECHOCARDIOGRAM (TEE);  Surgeon: Sanda Klein, MD;  Location: [facility] END[facility];  Service: Cardiovascular;  Laterality: N/A;  . TONSILECTOMY, ADENOIDECTOMY, BILATERAL MYRINGOTOMY AND TUBES      Social History   Socioeconomic History  . Marital status: Married    Spouse  name: Not on file  . Number of children: Not on file  . Years of education: Not on file  . Highest education level: Not on file  Occupational History  . Occupation: Software engineer: [employer]  Tobacco Use  . Smoking status: Former Smoker    Packs/day: 1.00    Years: 30.00    Pack years: 30.00    Types: Cigarettes    Quit date: 05/03/2000    Years since quitting: 19.1  . Smokeless tobacco: Never Used  Substance and Sexual Activity  . Alcohol use: Yes    Alcohol/week: 3.0 standard drinks    Types: 1 Glasses of wine, 1 Cans of beer, 1 Shots of liquor per week    Comment: 1-2/month  . Drug use: No  . Sexual activity: Not on file  Other Topics Concern  . Not on file  Social History Narrative   Married (kids are patients here, wife seen elsewhere). 3 step children, 1 biological. No grandkids.    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 Difficulty of Paying Living Expenses:   Food Insecurity:   . Worried About Programme researcher, broadcasting/film/video in the Last Year:   . Barista in the Last Year:   Transportation Needs:   . Freight forwarder (Medical):   [name] Lack of Transportation (Non-Medical):   Physical Activity:   . Days of Exercise per Week:   . Minutes of Exercise per Session:   Stress:   . Feeling of Stress :   Social Connections:   . Frequency of Communication with Friends and Family:   . Frequency of Social Gatherings with Friends and Family:   . Attends Religious Services:   . Active Member of Clubs or Organizations:     . Attends Banker Meetings:   [name] Marital Status:   Intimate Partner Violence:   . Fear of Current or Ex-Partner:   . Emotionally Abused:   [name] Physically Abused:   . Sexually Abused:     Current Outpatient Medications on File Prior to Visit  Medication Sig Dispense Refill  . acetaminophen (TYLENOL) 325 MG tablet Take 2 tablets (650 mg total) by mouth every 4 (four) hours as needed for headache or mild pain.    . carvedilol (COREG) 6.25 MG tablet Take 1 tablet (6.25 mg total) by mouth 2 (two) times daily. 180 tablet 3  . clopidogrel (PLAVIX) 75 MG tablet Take 1 tablet (75 mg total) by mouth daily. 90 tablet 3  . Coenzyme Q10 60 MG CAPS Take 1 capsule by mouth daily.     [name] ezetimibe (ZETIA) 10 MG tablet TAKE 1 TABLET(10 MG) BY MOUTH DAILY 90 tablet 3  . glucose blood (ONETOUCH VERIO) test strip 1 each by Other route 2 (two) times daily. And lancets 2/day 200 each 12  . levothyroxine (SYNTHROID) 88 MCG tablet Take 1 tablet (88 mcg total) by mouth daily. 90 tablet 3  . Probiotic Product (PROBIOTIC PO) Take 1 capsule by mouth every morning.    . rosuvastatin (CRESTOR) 20 MG tablet Take 1 tablet (20 mg total) by mouth daily. 90 tablet 3  . sacubitril-valsartan (ENTRESTO) 49-51 MG Take 1 tablet by mouth 2 (two) times daily. 180 tablet 3  . tadalafil (CIALIS) 5 MG tablet Take 1 tablet (5 mg total) by mouth daily as needed for erectile dysfunction. 10 tablet 5  . Continuous Blood Gluc Sensor (FREESTYLE LIBRE 14 DAY SENSOR) MISC 1 Device by Does not apply route every 14 (fourteen) days. (Patient not taking: Reported on 06/18/2019) 6 each 3   No current facility-administered medications on file prior to visit.    No Known Allergies  Family History  Problem Relation Age of Onset  . CAD Father   . Other Mother        passed right before 17- tumor on kidney recent diagnosis  . Dementia Mother        progressing rapidly  . CAD Paternal Grandfather        83s  . CAD Paternal  Grandmother        68s  . Brain cancer Maternal Grandmother   . Lymphoma Sister        b cell  . Celiac disease Sister   . Diabetes Neg Hx     BP 100/70   Pulse 63   Ht 5\' 9"  (1.753 m)   Wt 190 lb (86.2 kg)   SpO2 94%   BMI 28.06 kg/m    Review of Systems  Denies LOC    Objective:   Physical Exam VITAL SIGNS:  See vs page GENERAL: no distress Pulses: dorsalis pedis intact bilat.   MSK: no deformity of the feet CV: no leg edema Skin:  no ulcer on the feet.  normal color and temp on the feet. Neuro: sensation is intact to touch on the feet  Lab Results  Component Value Date   HGBA1C 9.3 (A) 06/18/2019        Assessment & Plan:  Insulin-requiring type 2 DM, with PAD: he needs increased rx.  He declines to add GLP.  Hypoglycemia, due to insulin: this limits aggressiveness of glycemic control.   Patient Instructions  Please increase the Lantus to 32 units daily.  Please continue the same other medications.  check your blood sugar twice a day.  vary the time of day when you check, between before the 3 meals, and at bedtime.  also check if you have symptoms of your blood sugar being too high or too low.  please keep a record of the readings and bring it to your next appointment here (or you can bring the meter itself).  You can write it on any piece of paper.  please call us sooner if your blood sugar goes below 70, or if you have a lot of readings over 200. Please come back for a follow-up appointment in 3 months.

## 2019-06-28 ENCOUNTER — Other Ambulatory Visit (INDEPENDENT_AMBULATORY_CARE_PROVIDER_SITE_OTHER): Payer: Managed Care, Other (non HMO)

## 2019-06-28 ENCOUNTER — Other Ambulatory Visit: Payer: Self-pay

## 2019-06-28 DIAGNOSIS — E039 Hypothyroidism, unspecified: Secondary | ICD-10-CM

## 2019-06-28 LAB — TSH: TSH: 4.6 u[IU]/mL — ABNORMAL HIGH (ref 0.35–4.50)

## 2019-06-29 ENCOUNTER — Other Ambulatory Visit: Payer: Self-pay

## 2019-06-29 DIAGNOSIS — E039 Hypothyroidism, unspecified: Secondary | ICD-10-CM

## 2019-06-29 MED ORDER — LEVOTHYROXINE SODIUM 100 MCG PO TABS: 100.0000 ug | ORAL_TABLET | Freq: Every day | ORAL | 3 refills | Status: DC

## 2019-07-01 ENCOUNTER — Other Ambulatory Visit: Payer: Self-pay | Admitting: Family Medicine

## 2019-07-13 ENCOUNTER — Ambulatory Visit (INDEPENDENT_AMBULATORY_CARE_PROVIDER_SITE_OTHER): Payer: Managed Care, Other (non HMO) | Admitting: *Deleted

## 2019-07-13 DIAGNOSIS — I255 Ischemic cardiomyopathy: Secondary | ICD-10-CM | POA: Diagnosis not present

## 2019-07-13 LAB — CUP PACEART REMOTE DEVICE CHECK
Battery Remaining Longevity: 67 mo
Battery Voltage: 2.99 V
Brady Statistic AP VP Percent: 18.04 %
Brady Statistic AP VS Percent: 0.29 %
Brady Statistic AS VP Percent: 80.4 %
Brady Statistic AS VS Percent: 1.27 %
Brady Statistic RA Percent Paced: 18.28 %
Brady Statistic RV Percent Paced: 18.42 %
Date Time Interrogation Session: 20210608012304
HighPow Impedance: 60 Ohm
Implantable Lead Implant Date: 20171206
Implantable Lead Implant Date: 20171206
Implantable Lead Implant Date: 20171206
Implantable Lead Location: 753858
Implantable Lead Location: 753859
Implantable Lead Location: 753860
Implantable Lead Model: 4598
Implantable Lead Model: 5076
Implantable Pulse Generator Implant Date: 20171206
Lead Channel Impedance Value: 180 Ohm
Lead Channel Impedance Value: 184.154
Lead Channel Impedance Value: 191.854
Lead Channel Impedance Value: 203.256
Lead Channel Impedance Value: 208.568
Lead Channel Impedance Value: 304 Ohm
Lead Channel Impedance Value: 342 Ohm
Lead Channel Impedance Value: 380 Ohm
Lead Channel Impedance Value: 380 Ohm
Lead Channel Impedance Value: 399 Ohm
Lead Channel Impedance Value: 399 Ohm
Lead Channel Impedance Value: 399 Ohm
Lead Channel Impedance Value: 437 Ohm
Lead Channel Impedance Value: 627 Ohm
Lead Channel Impedance Value: 646 Ohm
Lead Channel Impedance Value: 665 Ohm
Lead Channel Impedance Value: 665 Ohm
Lead Channel Impedance Value: 703 Ohm
Lead Channel Pacing Threshold Amplitude: 0.375 V
Lead Channel Pacing Threshold Amplitude: 0.875 V
Lead Channel Pacing Threshold Amplitude: 1 V
Lead Channel Pacing Threshold Pulse Width: 0.4 ms
Lead Channel Pacing Threshold Pulse Width: 0.4 ms
Lead Channel Pacing Threshold Pulse Width: 0.4 ms
Lead Channel Sensing Intrinsic Amplitude: 14.375 mV
Lead Channel Sensing Intrinsic Amplitude: 14.375 mV
Lead Channel Sensing Intrinsic Amplitude: 2.875 mV
Lead Channel Sensing Intrinsic Amplitude: 2.875 mV
Lead Channel Setting Pacing Amplitude: 1.5 V
Lead Channel Setting Pacing Amplitude: 2 V
Lead Channel Setting Pacing Amplitude: 2.5 V
Lead Channel Setting Pacing Pulse Width: 0.4 ms
Lead Channel Setting Pacing Pulse Width: 0.4 ms
Lead Channel Setting Sensing Sensitivity: 0.3 mV

## 2019-07-14 NOTE — Progress Notes (Signed)
Remote ICD transmission.   

## 2019-07-16 LAB — HM DIABETES EYE EXAM

## 2019-08-03 ENCOUNTER — Other Ambulatory Visit: Payer: Self-pay | Admitting: Family Medicine

## 2019-08-16 ENCOUNTER — Other Ambulatory Visit: Payer: Self-pay

## 2019-08-16 ENCOUNTER — Other Ambulatory Visit (INDEPENDENT_AMBULATORY_CARE_PROVIDER_SITE_OTHER): Payer: Managed Care, Other (non HMO)

## 2019-08-16 DIAGNOSIS — E039 Hypothyroidism, unspecified: Secondary | ICD-10-CM | POA: Diagnosis not present

## 2019-08-16 LAB — TSH: TSH: 1.89 u[IU]/mL (ref 0.35–4.50)

## 2019-08-28 ENCOUNTER — Other Ambulatory Visit: Payer: Self-pay | Admitting: Family Medicine

## 2019-09-24 ENCOUNTER — Ambulatory Visit (INDEPENDENT_AMBULATORY_CARE_PROVIDER_SITE_OTHER): Payer: Managed Care, Other (non HMO) | Admitting: Endocrinology

## 2019-09-24 ENCOUNTER — Encounter: Payer: Self-pay | Admitting: Endocrinology

## 2019-09-24 ENCOUNTER — Other Ambulatory Visit: Payer: Self-pay

## 2019-09-24 VITALS — BP 130/80 | HR 62 | Ht 69.0 in | Wt 187.6 lb

## 2019-09-24 DIAGNOSIS — N183 Chronic kidney disease, stage 3 unspecified: Secondary | ICD-10-CM

## 2019-09-24 DIAGNOSIS — E1051 Type 1 diabetes mellitus with diabetic peripheral angiopathy without gangrene: Secondary | ICD-10-CM

## 2019-09-24 DIAGNOSIS — Z794 Long term (current) use of insulin: Secondary | ICD-10-CM | POA: Diagnosis not present

## 2019-09-24 DIAGNOSIS — E1122 Type 2 diabetes mellitus with diabetic chronic kidney disease: Secondary | ICD-10-CM

## 2019-09-24 DIAGNOSIS — E1151 Type 2 diabetes mellitus with diabetic peripheral angiopathy without gangrene: Secondary | ICD-10-CM

## 2019-09-24 LAB — POCT GLYCOSYLATED HEMOGLOBIN (HGB A1C): Hemoglobin A1C: 8.5 % — AB (ref 4.0–5.6)

## 2019-09-24 MED ORDER — DAPAGLIFLOZIN PROPANEDIOL 5 MG PO TABS
5.0000 mg | ORAL_TABLET | Freq: Every day | ORAL | 3 refills | Status: DC
Start: 2019-09-24 — End: 2020-04-07

## 2019-09-24 MED ORDER — LANTUS SOLOSTAR 100 UNIT/ML ~~LOC~~ SOPN
26.0000 [IU] | PEN_INJECTOR | SUBCUTANEOUS | Status: DC
Start: 2019-09-24 — End: 2019-09-24

## 2019-09-24 MED ORDER — LANTUS SOLOSTAR 100 UNIT/ML ~~LOC~~ SOPN: 26.0000 [IU] | PEN_INJECTOR | SUBCUTANEOUS | 3 refills | Status: DC

## 2019-09-24 MED ORDER — NOVOLOG FLEXPEN 100 UNIT/ML ~~LOC~~ SOPN: 9.0000 [IU] | PEN_INJECTOR | Freq: Three times a day (TID) | SUBCUTANEOUS | 3 refills | Status: DC

## 2019-09-24 NOTE — Progress Notes (Signed)
Subjective:    Patient ID: John Carrillo, male    DOB: 06-Nov-1950, 69 y.o.   MRN: [id number]  HPI Pt returns for f/u of diabetes mellitus:  DM type: Insulin-requiring type 2.   Dx'ed: 2008. Complications: CVA, PAD, renal insufficiency, and CAD.   Therapy: insulin since soon after dx.  DKA: never.  Severe hypoglycemia: never.   Pancreatitis: never.   Other: he declines pump, including V-GO; he takes multiple daily injections, but basal insulin is emphasized, after poor results with usual multiple daily injections regimen; he declines GLP   Interval history:  Pt says he seldom misses the insulin.  He has not yet started continuous glucose monitor.  He reduced Lantus to 26/d, due to hypoglycemia. no cbg record, but states cbg's vary from 76-200's.  There is little if any trend throughout the day.   Past Medical History:  Diagnosis Date  . Acute kidney injury superimposed on chronic kidney disease ([facility]) 01/25/2015  . CORONARY ARTERY DISEASE 08/11/2006  . CVA 12/19/2006   2008  . DIABETES MELLITUS, TYPE I 08/11/2006  . ECZEMA, HANDS 12/15/2009  . HYPERLIPIDEMIA 08/11/2006  . HYPERTENSION 08/11/2006    Past Surgical History:  Procedure Laterality Date  . CARDIAC CATHETERIZATION N/A 01/16/2015   Procedure: Left Heart Cath and Coronary Angiography;  Surgeon: [name], MD;  Location: [facility]IVE [facility];  Service: Cardiovascular;  Laterality: N/A;  . CORONARY STENT PLACEMENT    . EP IMPLANTABLE DEVICE N/A 01/10/2016   MDT Claria MRI Quad CRTD implanted by Dr [name]  . TEE WITHOUT CARDIOVERSION N/A 01/26/2015   Procedure: TRANSESOPHAGEAL ECHOCARDIOGRAM (TEE);  Surgeon: [name], MD;  Location: [facility] ENDOSCOPY;  Service: Cardiovascular;  Laterality: N/A;  . TONSILECTOMY, ADENOIDECTOMY, BILATERAL MYRINGOTOMY AND TUBES      Social History   Socioeconomic History  . Marital status: Married    Spouse name: Not on file  . Number of children: Not on file  . Years of education: Not  on file  . Highest education level: Not on file  Occupational History  . Occupation: Engineer, maintenance: [employer]  Tobacco Use  . Smoking status: Former Smoker    Packs/day: 1.00    Years: 30.00    Pack years: 30.00    Types: Cigarettes    Quit date: 05/03/2000    Years since quitting: 19.4  . Smokeless tobacco: Never Used  Vaping Use  . Vaping Use: Never used  Substance and Sexual Activity  . Alcohol use: Yes    Alcohol/week: 3.0 standard drinks    Types: 1 Glasses of wine, 1 Cans of beer, 1 Shots of liquor per week    Comment: 1-2/month  . Drug use: No  . Sexual activity: Not on file  Other Topics Concern  . Not on file  Social History Narrative   Married (kids are patients here, wife seen elsewhere). 3 step children, 1 biological. No grandkids.    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  Difficulty of Paying Living Expenses: Not on file  Food Insecurity:   . Worried About Programme researcher, broadcasting/film/video in the Last Year: Not on file  . Ran Out of Food in the Last Year: Not on file  Transportation Needs:   . Lack of Transportation (Medical): Not on file  . Lack of Transportation (Non-Medical): Not on file  Physical Activity:   . Days of Exercise per Week: Not on file  . Minutes of Exercise per Session: Not on file  Stress:   . Feeling of Stress : Not on file  Social Connections:   . Frequency of Communication with Friends and Family: Not on file  . Frequency of Social Gatherings with Friends and Family: Not on file  . Attends  Religious Services: Not on file  . Active Member of Clubs or Organizations: Not on file  . Attends Banker Meetings: Not on file  . Marital Status: Not on file  Intimate Partner Violence:   . Fear of Current or Ex-Partner: Not on file  . Emotionally Abused: Not on file  . Physically Abused: Not on file  . Sexually Abused: Not on file    Current Outpatient Medications on File Prior to Visit  Medication Sig Dispense Refill  . acetaminophen (TYLENOL) 325 MG tablet Take 2 tablets (650 mg total) by mouth every 4 (four) hours as needed for headache or mild pain.    . carvedilol (COREG) 6.25 MG tablet Take 1 tablet (6.25 mg total) by mouth 2 (two) times daily. 180 tablet 3  . clopidogrel (PLAVIX) 75 MG tablet Take 1 tablet (75 mg total) by mouth daily. 90 tablet 3  . Coenzyme Q10 60 MG CAPS Take 1 capsule by mouth daily.     . Continuous Blood Gluc Sensor (FREESTYLE LIBRE 14 DAY SENSOR) MISC 1 Device by Does not apply route every 14 (fourteen) days. 6 each 3  . ezetimibe (ZETIA) 10 MG tablet TAKE 1 TABLET(10 MG) BY MOUTH DAILY 90 tablet 3  . glucose blood (ONETOUCH VERIO) test strip 1 each by Other route 2 (two) times daily. And lancets 2/day 200 each 12  . levothyroxine (SYNTHROID) 100 MCG tablet Take 1 tablet (100 mcg total) by mouth daily. 30 tablet 3  . Probiotic Product (PROBIOTIC PO) Take 1 capsule by mouth every morning.    . rosuvastatin (CRESTOR) 20 MG tablet Take 1 tablet (20 mg total) by mouth daily. 90 tablet 3  . sacubitril-valsartan (ENTRESTO) 49-51 MG Take 1 tablet by mouth 2 (two) times daily. 180 tablet 3  . tadalafil (CIALIS) 5 MG tablet TAKE 1 TABLET(5 MG) BY MOUTH DAILY AS NEEDED FOR ERECTILE DYSFUNCTION 10 tablet 5   No current facility-administered medications on file prior to visit.    No Known Allergies  Family History  Problem Relation Age of Onset  . CAD Father   . Other Mother        passed right before 7- tumor on kidney recent diagnosis  .  Dementia Mother        progressing rapidly  . CAD Paternal Grandfather        46s  . CAD Paternal Grandmother        53s  . Brain cancer Maternal Grandmother   . Lymphoma Sister        b cell  . Celiac disease Sister   . Diabetes Neg Hx     BP 130/80   Pulse 62   Ht 5\' 9"  (1.753 m)  Wt 187 lb 9.6 oz (85.1 kg)   SpO2 98%   BMI 27.70 kg/m    Review of Systems Denies LOC    Objective:   Physical Exam VITAL SIGNS:  See vs page GENERAL: no distress Pulses: dorsalis pedis intact bilat.   MSK: no deformity of the feet CV: no leg edema Skin:  no ulcer on the feet.  normal color and temp on the feet. Neuro: sensation is intact to touch on the feet.   Lab Results  Component Value Date   HGBA1C 8.5 (A) 09/24/2019   Lab Results  Component Value Date   CREATININE 1.18 05/14/2019   BUN 14 05/14/2019   NA 143 05/14/2019   K 4.6 05/14/2019   CL 106 05/14/2019   CO2 30 05/14/2019      Assessment & Plan:  Insulin-requiring type 2 DM, with PAD: uncontrolled  Patient Instructions  I have sent a prescription to your pharmacy, to add "[place]." Please continue the same other medications.  check your blood sugar twice a day.  vary the time of day when you check, between before the 3 meals, and at bedtime.  also check if you have symptoms of your blood sugar being too high or too low.  please keep a record of the readings and bring it to your next appointment here (or you can bring the meter itself).  You can write it on any piece of paper.  please call us sooner if your blood sugar goes below 70, or if you have a lot of readings over 200. Please come back for a follow-up appointment in 3 months.

## 2019-09-24 NOTE — Patient Instructions (Addendum)
I have sent a prescription to your pharmacy, to add "John [name]" Please continue the same other medications.  check your blood sugar twice a day.  vary the time of day when you check, between before the 3 meals, and at bedtime.  also check if you have symptoms of your blood sugar being too high or too low.  please keep a record of the readings and bring it to your next appointment here (or you can bring the meter itself).  You can write it on any piece of paper.  please call us sooner if your blood sugar goes below 70, or if you have a lot of readings over 200. Please come back for a follow-up appointment in 3 months.

## 2019-09-28 ENCOUNTER — Encounter: Payer: Self-pay | Admitting: Family Medicine

## 2019-09-28 ENCOUNTER — Other Ambulatory Visit: Payer: Self-pay

## 2019-09-28 MED ORDER — TADALAFIL 5 MG PO TABS: ORAL_TABLET | ORAL | 5 refills | Status: DC

## 2019-10-12 ENCOUNTER — Ambulatory Visit (INDEPENDENT_AMBULATORY_CARE_PROVIDER_SITE_OTHER): Payer: Managed Care, Other (non HMO) | Admitting: *Deleted

## 2019-10-12 DIAGNOSIS — I255 Ischemic cardiomyopathy: Secondary | ICD-10-CM | POA: Diagnosis not present

## 2019-10-12 LAB — CUP PACEART REMOTE DEVICE CHECK
Battery Remaining Longevity: 64 mo
Battery Voltage: 2.98 V
Brady Statistic AP VP Percent: 24.18 %
Brady Statistic AP VS Percent: 0.36 %
Brady Statistic AS VP Percent: 74.31 %
Brady Statistic AS VS Percent: 1.15 %
Brady Statistic RA Percent Paced: 24.45 %
Brady Statistic RV Percent Paced: 25.66 %
Date Time Interrogation Session: 20210907001604
HighPow Impedance: 69 Ohm
Implantable Lead Implant Date: 20171206
Implantable Lead Implant Date: 20171206
Implantable Lead Implant Date: 20171206
Implantable Lead Location: 753858
Implantable Lead Location: 753859
Implantable Lead Location: 753860
Implantable Lead Model: 4598
Implantable Lead Model: 5076
Implantable Pulse Generator Implant Date: 20171206
Lead Channel Impedance Value: 184.154
Lead Channel Impedance Value: 191.854
Lead Channel Impedance Value: 191.854
Lead Channel Impedance Value: 208.568
Lead Channel Impedance Value: 218.5 Ohm
Lead Channel Impedance Value: 266 Ohm
Lead Channel Impedance Value: 342 Ohm
Lead Channel Impedance Value: 380 Ohm
Lead Channel Impedance Value: 399 Ohm
Lead Channel Impedance Value: 399 Ohm
Lead Channel Impedance Value: 437 Ohm
Lead Channel Impedance Value: 437 Ohm
Lead Channel Impedance Value: 437 Ohm
Lead Channel Impedance Value: 646 Ohm
Lead Channel Impedance Value: 665 Ohm
Lead Channel Impedance Value: 703 Ohm
Lead Channel Impedance Value: 703 Ohm
Lead Channel Impedance Value: 722 Ohm
Lead Channel Pacing Threshold Amplitude: 0.5 V
Lead Channel Pacing Threshold Amplitude: 1.125 V
Lead Channel Pacing Threshold Amplitude: 1.125 V
Lead Channel Pacing Threshold Pulse Width: 0.4 ms
Lead Channel Pacing Threshold Pulse Width: 0.4 ms
Lead Channel Pacing Threshold Pulse Width: 0.4 ms
Lead Channel Sensing Intrinsic Amplitude: 12.875 mV
Lead Channel Sensing Intrinsic Amplitude: 12.875 mV
Lead Channel Sensing Intrinsic Amplitude: 2.875 mV
Lead Channel Sensing Intrinsic Amplitude: 2.875 mV
Lead Channel Setting Pacing Amplitude: 1.75 V
Lead Channel Setting Pacing Amplitude: 2 V
Lead Channel Setting Pacing Amplitude: 2.5 V
Lead Channel Setting Pacing Pulse Width: 0.4 ms
Lead Channel Setting Pacing Pulse Width: 0.4 ms
Lead Channel Setting Sensing Sensitivity: 0.3 mV

## 2019-10-14 NOTE — Progress Notes (Signed)
Remote ICD transmission.   

## 2019-10-16 ENCOUNTER — Other Ambulatory Visit: Payer: Self-pay | Admitting: Cardiovascular Disease

## 2019-10-19 ENCOUNTER — Other Ambulatory Visit: Payer: Self-pay | Admitting: Family Medicine

## 2019-11-15 ENCOUNTER — Other Ambulatory Visit: Payer: Self-pay | Admitting: Family Medicine

## 2019-11-30 ENCOUNTER — Encounter: Payer: Self-pay | Admitting: Family Medicine

## 2019-12-11 ENCOUNTER — Other Ambulatory Visit: Payer: Self-pay | Admitting: Nurse Practitioner

## 2019-12-13 ENCOUNTER — Telehealth: Payer: Self-pay

## 2019-12-13 ENCOUNTER — Other Ambulatory Visit: Payer: Self-pay

## 2019-12-13 ENCOUNTER — Other Ambulatory Visit: Payer: Self-pay | Admitting: Internal Medicine

## 2019-12-13 DIAGNOSIS — E785 Hyperlipidemia, unspecified: Secondary | ICD-10-CM

## 2019-12-13 MED ORDER — CLOPIDOGREL BISULFATE 75 MG PO TABS
75.0000 mg | ORAL_TABLET | Freq: Every day | ORAL | 0 refills | Status: DC
Start: 2019-12-13 — End: 2020-01-10

## 2019-12-13 NOTE — Telephone Encounter (Signed)
-----   Message from Olene Floss, RPH-CPP sent at 12/13/2019  7:30 AM EST ----- He needs a lipid panel. He has endocrinology apt on Friday. I sent Dr. Everardo All a message asking if he would order lipid panel. Haven't heard back. If pt doesn't want to come to our office, he can ask Dr. Everardo All at visit if he could order lipid panel.  ----- Message ----- From: Olene Floss, RPH-CPP Sent: 12/08/2019 To: Olene Floss, RPH-CPP  Set up patient for lipids/lft if he hasnt had any since 06/07/19

## 2019-12-13 NOTE — Telephone Encounter (Signed)
Called and lmomed the pt to call back to schedule fasting lipid labs

## 2019-12-14 NOTE — Addendum Note (Signed)
Addended by: Virl Axe, Johnattan Strassman L on: 12/14/2019 01:54 PM   Modules accepted: Orders

## 2019-12-14 NOTE — Telephone Encounter (Signed)
Called patient back about message. Patient will come in this Friday for fasting lipid and liver panel.

## 2019-12-14 NOTE — Telephone Encounter (Signed)
Pt called in and stated he is about to call DR Everardo All office and resch is appt this week to dec,  He would like to know if he needs to come in sooner for the lipid panel , can the order just be put in at Dr [name] office and he do it there?    Best number  [phone]

## 2019-12-16 ENCOUNTER — Encounter: Payer: Self-pay | Admitting: Family Medicine

## 2019-12-17 ENCOUNTER — Other Ambulatory Visit: Payer: Managed Care, Other (non HMO) | Admitting: *Deleted

## 2019-12-17 ENCOUNTER — Other Ambulatory Visit: Payer: Self-pay

## 2019-12-17 ENCOUNTER — Ambulatory Visit: Payer: Managed Care, Other (non HMO) | Admitting: Endocrinology

## 2019-12-17 DIAGNOSIS — E785 Hyperlipidemia, unspecified: Secondary | ICD-10-CM

## 2019-12-17 LAB — LIPID PANEL
Chol/HDL Ratio: 2.6 ratio (ref 0.0–5.0)
Cholesterol, Total: 125 mg/dL (ref 100–199)
HDL: 48 mg/dL (ref >39)
LDL Chol Calc (NIH): 59 mg/dL (ref 0–99)
Triglycerides: 93 mg/dL (ref 0–149)
VLDL Cholesterol Cal: 18 mg/dL (ref 5–40)

## 2019-12-17 LAB — HEPATIC FUNCTION PANEL
ALT: 39 IU/L (ref 0–44)
AST: 39 IU/L (ref 0–40)
Albumin: 4 g/dL (ref 3.8–4.8)
Alkaline Phosphatase: 59 IU/L (ref 44–121)
Bilirubin Total: 0.3 mg/dL (ref 0.0–1.2)
Bilirubin, Direct: 0.14 mg/dL (ref 0.00–0.40)
Total Protein: 6.4 g/dL (ref 6.0–8.5)

## 2019-12-18 ENCOUNTER — Other Ambulatory Visit: Payer: Self-pay | Admitting: Family Medicine

## 2019-12-20 ENCOUNTER — Telehealth: Payer: Self-pay

## 2019-12-20 DIAGNOSIS — E785 Hyperlipidemia, unspecified: Secondary | ICD-10-CM

## 2019-12-20 NOTE — Telephone Encounter (Signed)
-----   Message from Wendall Stade, MD sent at 12/19/2019  3:56 PM EST ----- Cholesterol is at goal and LFT's are normal F/U labs in 6 months

## 2019-12-20 NOTE — Telephone Encounter (Signed)
The patient has been notified of the result and verbalized understanding.  All questions (if any) were answered. Ethelda Chick, RN 12/20/2019 8:48 AM   Patient will come in on 06/16/20 for lab work.

## 2020-01-09 ENCOUNTER — Other Ambulatory Visit: Payer: Self-pay | Admitting: Internal Medicine

## 2020-01-11 ENCOUNTER — Other Ambulatory Visit: Payer: Self-pay | Admitting: Internal Medicine

## 2020-01-11 ENCOUNTER — Ambulatory Visit (INDEPENDENT_AMBULATORY_CARE_PROVIDER_SITE_OTHER): Payer: Managed Care, Other (non HMO)

## 2020-01-11 DIAGNOSIS — I255 Ischemic cardiomyopathy: Secondary | ICD-10-CM | POA: Diagnosis not present

## 2020-01-11 LAB — CUP PACEART REMOTE DEVICE CHECK
Battery Remaining Longevity: 59 mo
Battery Voltage: 2.98 V
Brady Statistic AP VP Percent: 14.72 %
Brady Statistic AP VS Percent: 0.26 %
Brady Statistic AS VP Percent: 83.72 %
Brady Statistic AS VS Percent: 1.3 %
Brady Statistic RA Percent Paced: 14.94 %
Brady Statistic RV Percent Paced: 15.86 %
Date Time Interrogation Session: 20211207012404
HighPow Impedance: 70 Ohm
Implantable Lead Implant Date: 20171206
Implantable Lead Implant Date: 20171206
Implantable Lead Implant Date: 20171206
Implantable Lead Location: 753858
Implantable Lead Location: 753859
Implantable Lead Location: 753860
Implantable Lead Model: 4598
Implantable Lead Model: 5076
Implantable Pulse Generator Implant Date: 20171206
Lead Channel Impedance Value: 194.634
Lead Channel Impedance Value: 203.256
Lead Channel Impedance Value: 207.273
Lead Channel Impedance Value: 212.8 Ohm
Lead Channel Impedance Value: 223.149
Lead Channel Impedance Value: 323 Ohm
Lead Channel Impedance Value: 380 Ohm
Lead Channel Impedance Value: 399 Ohm
Lead Channel Impedance Value: 399 Ohm
Lead Channel Impedance Value: 399 Ohm
Lead Channel Impedance Value: 437 Ohm
Lead Channel Impedance Value: 456 Ohm
Lead Channel Impedance Value: 456 Ohm
Lead Channel Impedance Value: 665 Ohm
Lead Channel Impedance Value: 665 Ohm
Lead Channel Impedance Value: 703 Ohm
Lead Channel Impedance Value: 722 Ohm
Lead Channel Impedance Value: 760 Ohm
Lead Channel Pacing Threshold Amplitude: 0.375 V
Lead Channel Pacing Threshold Amplitude: 0.875 V
Lead Channel Pacing Threshold Amplitude: 1.25 V
Lead Channel Pacing Threshold Pulse Width: 0.4 ms
Lead Channel Pacing Threshold Pulse Width: 0.4 ms
Lead Channel Pacing Threshold Pulse Width: 0.4 ms
Lead Channel Sensing Intrinsic Amplitude: 13.375 mV
Lead Channel Sensing Intrinsic Amplitude: 13.375 mV
Lead Channel Sensing Intrinsic Amplitude: 2.875 mV
Lead Channel Sensing Intrinsic Amplitude: 2.875 mV
Lead Channel Setting Pacing Amplitude: 1.75 V
Lead Channel Setting Pacing Amplitude: 2 V
Lead Channel Setting Pacing Amplitude: 2.5 V
Lead Channel Setting Pacing Pulse Width: 0.4 ms
Lead Channel Setting Pacing Pulse Width: 0.4 ms
Lead Channel Setting Sensing Sensitivity: 0.3 mV

## 2020-01-14 ENCOUNTER — Ambulatory Visit (INDEPENDENT_AMBULATORY_CARE_PROVIDER_SITE_OTHER): Payer: Managed Care, Other (non HMO) | Admitting: Endocrinology

## 2020-01-14 ENCOUNTER — Encounter: Payer: Self-pay | Admitting: Endocrinology

## 2020-01-14 ENCOUNTER — Other Ambulatory Visit: Payer: Self-pay

## 2020-01-14 VITALS — BP 118/80 | HR 65 | Ht 69.0 in | Wt 181.2 lb

## 2020-01-14 DIAGNOSIS — E1051 Type 1 diabetes mellitus with diabetic peripheral angiopathy without gangrene: Secondary | ICD-10-CM | POA: Diagnosis not present

## 2020-01-14 DIAGNOSIS — N183 Chronic kidney disease, stage 3 unspecified: Secondary | ICD-10-CM

## 2020-01-14 DIAGNOSIS — Z794 Long term (current) use of insulin: Secondary | ICD-10-CM

## 2020-01-14 DIAGNOSIS — E1122 Type 2 diabetes mellitus with diabetic chronic kidney disease: Secondary | ICD-10-CM | POA: Diagnosis not present

## 2020-01-14 DIAGNOSIS — E1151 Type 2 diabetes mellitus with diabetic peripheral angiopathy without gangrene: Secondary | ICD-10-CM | POA: Diagnosis not present

## 2020-01-14 LAB — HEPATIC FUNCTION PANEL
ALT: 39 U/L (ref 0–53)
AST: 37 U/L (ref 0–37)
Albumin: 3.9 g/dL (ref 3.5–5.2)
Alkaline Phosphatase: 48 U/L (ref 39–117)
Bilirubin, Direct: 0.1 mg/dL (ref 0.0–0.3)
Total Bilirubin: 0.4 mg/dL (ref 0.2–1.2)
Total Protein: 6.7 g/dL (ref 6.0–8.3)

## 2020-01-14 LAB — POCT GLYCOSYLATED HEMOGLOBIN (HGB A1C): Hemoglobin A1C: 8.4 % — AB (ref 4.0–5.6)

## 2020-01-14 LAB — LIPID PANEL
Cholesterol: 122 mg/dL (ref 0–200)
HDL: 50.8 mg/dL (ref >39.00)
LDL Cholesterol: 58 mg/dL (ref 0–99)
NonHDL: 71.09
Total CHOL/HDL Ratio: 2
Triglycerides: 65 mg/dL (ref 0.0–149.0)
VLDL: 13 mg/dL (ref 0.0–40.0)

## 2020-01-14 LAB — BASIC METABOLIC PANEL
BUN: 24 mg/dL — ABNORMAL HIGH (ref 6–23)
CO2: 31 mEq/L (ref 19–32)
Calcium: 9.1 mg/dL (ref 8.4–10.5)
Chloride: 107 mEq/L (ref 96–112)
Creatinine, Ser: 1.29 mg/dL (ref 0.40–1.50)
GFR: 57.03 mL/min — ABNORMAL LOW (ref >60.00)
Glucose, Bld: 41 mg/dL — CL (ref 70–99)
Potassium: 4.5 mEq/L (ref 3.5–5.1)
Sodium: 143 mEq/L (ref 135–145)

## 2020-01-14 MED ORDER — LANTUS SOLOSTAR 100 UNIT/ML ~~LOC~~ SOPN
28.0000 [IU] | PEN_INJECTOR | SUBCUTANEOUS | 3 refills | Status: DC
Start: 2020-01-14 — End: 2020-03-18

## 2020-01-14 MED ORDER — NOVOLOG FLEXPEN 100 UNIT/ML ~~LOC~~ SOPN
6.0000 [IU] | PEN_INJECTOR | Freq: Three times a day (TID) | SUBCUTANEOUS | 3 refills | Status: DC
Start: 2020-01-14 — End: 2020-06-07

## 2020-01-14 NOTE — Patient Instructions (Addendum)
Please increase the Lantus to 28 units daily. Please continue the same Novolog. Blood tests are requested for you today.  We'll let you know about the results.  check your blood sugar twice a day.  vary the time of day when you check, between before the 3 meals, and at bedtime.  also check if you have symptoms of your blood sugar being too high or too low.  please keep a record of the readings and bring it to your next appointment here (or you can bring the meter itself).  You can write it on any piece of paper.  please call us sooner if your blood sugar goes below 70, or if you have a lot of readings over 200. Please come back for a follow-up appointment in 3 months.

## 2020-01-14 NOTE — Progress Notes (Signed)
Subjective:    Patient ID: John Carrillo, male    DOB: 12-Sep-1950, 69 y.o.   MRN: [id number]  HPI Pt returns for f/u of diabetes mellitus:  DM type: Insulin-requiring type 2.   Dx'ed: 2008. Complications: CVA, PAD, renal insufficiency, and CAD.   Therapy: insulin since soon after dx, and Farxiga DKA: never.  Severe hypoglycemia: never.   Pancreatitis: never.   Other: he declines pump, including V-GO; he takes multiple daily injections, but basal insulin is emphasized, after poor results with usual multiple daily injections regimen; he declines GLP   Interval history:  Pt says he seldom misses the insulin.  He declines to use continuous glucose monitor.  He requests a new meter.  He reports mild hypoglycemia approx once per week.  This happens mid-morning.  He reduced Novolog to 6-8 units 3 times a day (just before each meal).  He continues Lantus 26 units qd.    Past Medical History:  Diagnosis Date  . Acute kidney injury superimposed on chronic kidney disease (HCC) 01/25/2015  . CORONARY ARTERY DISEASE 08/11/2006  . CVA 12/19/2006   2008  . DIABETES MELLITUS, TYPE I 08/11/2006  . ECZEMA, HANDS 12/15/2009  . HYPERLIPIDEMIA 08/11/2006  . HYPERTENSION 08/11/2006  . Type 2 diabetes mellitus without complications (HCC) 12/20/2016    Past Surgical History:  Procedure Laterality Date  . CARDIAC CATHETERIZATION N/A 01/16/2015   Procedure: Left Heart Cath and Coronary Angiography;  Surgeon: [name], MD;  Location: [facility] INVASIVE CV LAB;  Service: Cardiovascular;  Laterality: N/A;  . CORONARY STENT PLACEMENT    . EP IMPLANTABLE DEVICE N/A 01/10/2016   MDT Claria MRI Quad CRTD implanted by Dr [name]  . TEE WITHOUT CARDIOVERSION N/A 01/26/2015   Procedure: TRANSESOPHAGEAL ECHOCARDIOGRAM (TEE);  Surgeon: [name], MD;  Location: [facility] ENDOSCOPY;  Service: Cardiovascular;  Laterality: N/A;  . TONSILECTOMY, ADENOIDECTOMY, BILATERAL MYRINGOTOMY AND TUBES      Social History    Socioeconomic History  . Marital status: Married    Spouse name: Not on file  . Number of children: Not on file  . Years of education: Not on file  . Highest education level: Not on file  Occupational History  . Occupation: Engineer, maintenance: [employer]  Tobacco Use  . Smoking status: Former Smoker    Packs/day: 1.00    Years: 30.00    Pack years: 30.00    Types: Cigarettes    Quit date: 05/03/2000    Years since quitting: 19.7  . Smokeless tobacco: Never Used  Vaping Use  . Vaping Use: Never used  Substance and Sexual Activity  . Alcohol use: Yes    Alcohol/week: 3.0 standard drinks    Types: 1 Glasses of wine, 1 Cans of beer, 1 Shots of liquor per week    Comment: 1-2/month  . Drug use: No  . Sexual activity: Not on file  Other Topics Concern  . Not on file  Social History Narrative   Married (kids are patients here, wife seen elsewhere). 3 step children, 1 biological. No grandkids.    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   Oldest daughter [name] - college of Patent attorney- social work intl business minor religion- now trying to get foot in door at [employer]- wants to be in Midwife for general dynamics- consider age 80 retirement  Hobbies: race sail boats, adrenaline related activities   Social Determinants of Health   Financial Resource Strain: Not on file  Food Insecurity: Not on file  Transportation Needs: Not on file  Physical Activity: Not on file  Stress: Not on file  Social Connections: Not on file  Intimate Partner Violence: Not on file    Current Outpatient Medications on File Prior to Visit  Medication Sig Dispense Refill  . acetaminophen (TYLENOL) 325 MG tablet Take 2 tablets (650 mg total) by mouth every 4 (four) hours as needed for headache or mild pain.    . carvedilol (COREG) 6.25 MG tablet Take 1 tablet (6.25 mg total)  by mouth 2 (two) times daily. Please make yearly appt with Dr. [name] for October before anymore refills. 1st attempt 180 tablet 0  . clopidogrel (PLAVIX) 75 MG tablet Take 1 tablet (75 mg total) by mouth daily. Please make overdue appt with Dr. [name] before anymore refills. Thank you 2nd attempt 15 tablet 0  . Coenzyme Q10 60 MG CAPS Take 1 capsule by mouth daily.     . dapagliflozin propanediol (FARXIGA) 5 MG TABS tablet Take 1 tablet (5 mg total) by mouth daily before breakfast. 90 tablet 3  . ezetimibe (ZETIA) 10 MG tablet TAKE 1 TABLET(10 MG) BY MOUTH DAILY 90 tablet 3  . glucose blood (ONETOUCH VERIO) test strip 1 each by Other route 2 (two) times daily. And lancets 2/day 200 each 12  . levothyroxine (SYNTHROID) 100 MCG tablet TAKE 1 TABLET(100 MCG) BY MOUTH DAILY 90 tablet 3  . Probiotic Product (PROBIOTIC PO) Take 1 capsule by mouth every morning.    . rosuvastatin (CRESTOR) 20 MG tablet Take 1 tablet (20 mg total) by mouth daily. 90 tablet 3  . sacubitril-valsartan (ENTRESTO) 49-51 MG Take 1 tablet by mouth 2 (two) times daily. 180 tablet 3  . tadalafil (CIALIS) 5 MG tablet TAKE 1 TABLET(5 MG) BY MOUTH DAILY AS NEEDED FOR ERECTILE DYSFUNCTION 10 tablet 5   No current facility-administered medications on file prior to visit.    No Known Allergies  Family History  Problem Relation Age of Onset  . CAD Father   . Other Mother        passed right before 54- tumor on kidney recent diagnosis  . Dementia Mother        progressing rapidly  . CAD Paternal Grandfather        64s  . CAD Paternal Grandmother        60s  . Brain cancer Maternal Grandmother   . Lymphoma Sister        b cell  . Celiac disease Sister   . Diabetes Neg Hx     BP 118/80   Pulse 65   Ht 5\' 9"  (1.753 m)   Wt 181 lb 3.2 oz (82.2 kg)   SpO2 96%   BMI 26.76 kg/m    Review of Systems     Objective:   Physical Exam VITAL SIGNS:  See vs page GENERAL: no distress Pulses: dorsalis pedis intact bilat.    MSK: no deformity of the feet CV: no leg edema Skin:  no ulcer on the feet.  normal color and temp on the feet. Neuro: sensation is intact to touch on the feet  Lab Results  Component Value Date   HGBA1C 8.5 (A) 09/24/2019      Assessment & Plan:  Insulin-requiring type 2 DM, with PAD: uncontrolled.  Hypoglycemia, due to insulin: this limits aggressiveness  of glycemic control.    Patient Instructions  Please increase the Lantus to 28 units daily. Please continue the same Novolog. Blood tests are requested for you today.  We'll let you know about the results.  check your blood sugar twice a day.  vary the time of day when you check, between before the 3 meals, and at bedtime.  also check if you have symptoms of your blood sugar being too high or too low.  please keep a record of the readings and bring it to your next appointment here (or you can bring the meter itself).  You can write it on any piece of paper.  please call us sooner if your blood sugar goes below 70, or if you have a lot of readings over 200. Please come back for a follow-up appointment in 3 months.

## 2020-01-15 DIAGNOSIS — E119 Type 2 diabetes mellitus without complications: Secondary | ICD-10-CM | POA: Insufficient documentation

## 2020-01-19 ENCOUNTER — Other Ambulatory Visit: Payer: Self-pay | Admitting: Internal Medicine

## 2020-01-21 NOTE — Progress Notes (Signed)
Remote ICD transmission.   

## 2020-02-06 ENCOUNTER — Other Ambulatory Visit: Payer: Self-pay | Admitting: Cardiovascular Disease

## 2020-02-08 MED ORDER — ENTRESTO 49-51 MG PO TABS: 1.0000 | ORAL_TABLET | Freq: Two times a day (BID) | ORAL | 0 refills | Status: DC

## 2020-02-11 ENCOUNTER — Other Ambulatory Visit: Payer: Self-pay | Admitting: Family Medicine

## 2020-02-19 ENCOUNTER — Other Ambulatory Visit: Payer: Self-pay | Admitting: Internal Medicine

## 2020-02-24 ENCOUNTER — Encounter: Payer: Self-pay | Admitting: Family Medicine

## 2020-02-24 ENCOUNTER — Other Ambulatory Visit: Payer: Self-pay

## 2020-02-24 ENCOUNTER — Other Ambulatory Visit: Payer: Self-pay | Admitting: Cardiovascular Disease

## 2020-02-24 MED ORDER — CLOPIDOGREL BISULFATE 75 MG PO TABS: 75.0000 mg | ORAL_TABLET | Freq: Every day | ORAL | 0 refills | Status: DC

## 2020-02-24 MED ORDER — TADALAFIL 5 MG PO TABS: ORAL_TABLET | ORAL | 0 refills | Status: DC

## 2020-02-24 NOTE — Progress Notes (Signed)
Sent in refill for patient's plavix. Sent mychart message to let him know he needs to schedule an appointment with our office.

## 2020-03-11 NOTE — Patient Instructions (Addendum)
Health Maintenance Due  Topic Date Due  . Fecal DNA (Cologuard) Please let us know if you dont receive within 3 weeks 01/21/2020   Please stop by lab before you go If you have mychart- we will send your results within 3 business days of [place] receiving them.  If you do not have mychart- we will call you about results within 5 business days of [place] receiving them.  *please also note that you will see labs on mychart as soon as they post. I will later go in and write notes on them- will say "notes from Dr. Durene Cal"  Team please log flu shot from oct 29 at [facility]  Recommended follow up: 6-12 months and could schedule as physical depending on insurance type at time

## 2020-03-11 NOTE — Progress Notes (Signed)
Phone [phone] In person visit   Subjective:   John Carrillo is a 70 y.o. year old very pleasant male patient who presents for/with See problem oriented charting Chief Complaint  Patient presents with  . Medication Refill    Cialis   This visit occurred during the SARS-CoV-2 public health emergency.  Safety protocols were in place, including screening questions prior to the visit, additional usage of staff PPE, and extensive cleaning of exam room while observing appropriate contact time as indicated for disinfecting solutions.   Past Medical History-  Patient Active Problem List   Diagnosis Date Noted  . Chronic systolic CHF (congestive heart failure) (HCC) 01/25/2015    Priority: High  . Cardiomyopathy, ischemic 01/17/2015    Priority: High  . History of CVA (cerebrovascular accident) 12/19/2006    Priority: High  . CAD S/P LAD PCI'03, RCA DES 01/16/15 08/11/2006    Priority: High  . Left groin hernia 02/13/2018    Priority: Medium  . Hypothyroidism 02/17/2015    Priority: Medium  . CKD (chronic kidney disease), stage III (HCC) 01/17/2015    Priority: Medium  . Dyslipidemia 08/11/2006    Priority: Medium  . Essential hypertension 08/11/2006    Priority: Medium  . Allergic rhinitis 07/11/2017    Priority: Low  . Epidermoid cyst of ear 10/22/2016    Priority: Low  . LBBB (left bundle branch block) 01/17/2015    Priority: Low  . SVT (supraventricular tachycardia) (HCC) 01/14/2015    Priority: Low  . Former smoker 06/24/2014    Priority: Low  . Eczema of both hands 12/15/2009    Priority: Low  . Type 1 diabetes mellitus with diabetic peripheral angiopathy without gangrene (HCC) 03/14/2020  . Diabetes (HCC) 01/15/2020  . Erectile dysfunction 05/14/2019  . Chronic systolic dysfunction of left ventricle 01/10/2016    Medications- reviewed and updated Current Outpatient Medications  Medication Sig Dispense Refill  . acetaminophen (TYLENOL) 325 MG tablet Take 2  tablets (650 mg total) by mouth every 4 (four) hours as needed for headache or mild pain.    . carvedilol (COREG) 6.25 MG tablet TAKE 1 TABLET BY MOUTH TWICE DAILY 60 tablet 0  . clopidogrel (PLAVIX) 75 MG tablet Take 1 tablet (75 mg total) by mouth daily. Please make overdue appt with Dr. [name] before anymore refills. Thank you 3rd attempt 30 tablet 0  . Coenzyme Q10 60 MG CAPS Take 1 capsule by mouth daily.     . dapagliflozin propanediol (FARXIGA) 5 MG TABS tablet Take 1 tablet (5 mg total) by mouth daily before breakfast. 90 tablet 3  . ezetimibe (ZETIA) 10 MG tablet TAKE 1 TABLET(10 MG) BY MOUTH DAILY 90 tablet 3  . glucose blood (ONETOUCH VERIO) test strip 1 each by Other route 2 (two) times daily. And lancets 2/day 200 each 12  . insulin aspart (NOVOLOG FLEXPEN) 100 UNIT/ML FlexPen Inject 6-8 Units into the skin 3 (three) times daily with meals. 30 mL 3  . insulin glargine (LANTUS SOLOSTAR) 100 UNIT/ML Solostar Pen Inject 28 Units into the skin every morning. 30 mL 3  . levothyroxine (SYNTHROID) 100 MCG tablet TAKE 1 TABLET(100 MCG) BY MOUTH DAILY 90 tablet 3  . Probiotic Product (PROBIOTIC PO) Take 1 capsule by mouth every morning.    . rosuvastatin (CRESTOR) 20 MG tablet Take 1 tablet (20 mg total) by mouth daily. 90 tablet 3  . sacubitril-valsartan (ENTRESTO) 49-51 MG Take 1 tablet by mouth 2 (two) times daily. Please make overdue  appt with Dr. [name] before anymore refills. Thank you 1st attempt 60 tablet 0  . tadalafil (CIALIS) 5 MG tablet Take 1 tablet (5 mg total) by mouth daily. Patient need an office visit with Dr. Durene Cal for further refills. 90 tablet 3   No current facility-administered medications for this visit.     Objective:  BP 104/76   Pulse 67   Temp 98 F (36.7 C) (Temporal)   Ht 5\' 9"  (1.753 m)   Wt 179 lb 12.8 oz (81.6 kg)   SpO2 96%   BMI 26.55 kg/m  Gen: NAD, resting comfortably CV: RRR no murmurs rubs or gallops Lungs: CTAB no crackles, wheeze,  rhonchi Abdomen: soft/nontender/nondistended/normal bowel sounds. No rebound or guarding.  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 Went live 02/28/20. considreing retirement- fishing, travel, boating  #Heart failure with reduced ejection fraction- follows with Dr. 03/01/20 and Dr. [name] S: Medication: Carvedilol 6.25 mg twice daily, Farxiga 5 mg, Entresto 49-51 mg  Edema: none Weight gain: none Shortness of breath: none. Even with rockclimbing in [place]!   A/P: Patient remains euvolemic.  Continue current medications  #Sustained VT-no recent recurrence.  Has ICD and pacemaker in place. Continue current monitoring with cardiology  #CAD-remains asymptomatic.  Continue statin and Plavix. plavix also for history of stroke  #hyperlipidemia S: Medication:Zetia 10 mg, rosuvastatin 10 mg Lab Results  Component Value Date   CHOL 122 01/14/2020   HDL 50.80 01/14/2020   LDLCALC 58 01/14/2020   LDLDIRECT 85.0 07/11/2017   TRIG 65.0 01/14/2020   CHOLHDL 2 01/14/2020   A/P: well controlled- continue current meds   # Diabetes type I-follows with Dr. 14/11/2019- goal at least under 8 S: Medication:Patient compliant with Lantus daily as well as NovoLog before meals. CBGs- have to limit being more aggressive due to lows and inconsistent schedule- potentially with retirement could be more stable and potentially bring this down safely Lab Results  Component Value Date   HGBA1C 8.4 (A) 01/14/2020   HGBA1C 8.5 (A) 09/24/2019   HGBA1C 9.3 (A) 06/18/2019  A/P: poor control- hoping more regular schedule will help   #hypothyroidism S: compliant On thyroid medication-levothyroxine 100 mcg Lab Results  Component Value Date   TSH 1.89 08/16/2019   A/P:Stable. Continue current medications.    #Erectile  dysfunction S:Patient using Cialis 5 mg and finds helpful.  A/P: we are going to try daily on this to see if helps with BPH as well- see below   #Also using cialis for BPH- saw urology in 2019 and plan was to monitor. He has enjoyed the improvement in BPH symptoms. I do not see the notes from urology- we will update PSA Lab Results  Component Value Date   PSA 5.19 (H) 07/11/2017   PSA 4.31 (H) 01/03/2017   PSA 0.95 04/11/2006   # left groin hernia- prior referred but was just before covid and got pushed back- we will refer again today -would need cardiology clearance  #Chronic kidney disease stage III S: GFR is typically in the 50s to 60srange -Patient knows to avoid NSAIDs  A/P:  #s have fluctuated in the past- most recent GFR in 50s but at times into 60s- continue to monitor- was just checked in December so will hold off otday  Recommended follow up: No follow-ups on file. Future Appointments  Date Time Provider Department Center  04/07/2020  8:00 AM Romero Belling, MD [facility] None  04/07/2020  9:40 AM Graciella Freer, PA-C [facility]  04/11/2020  8:05 AM [facility] DEVICE REMOTES [facility]  04/21/2020  8:15 AM Tereso Newcomer T, PA-C [facility]  06/16/2020  7:30 AM [facility] LAB [facility]  07/11/2020  8:05 AM [facility] DEVICE REMOTES [facility]  10/10/2020  8:05 AM [facility] DEVICE REMOTES [facility]  01/09/2021  8:05 AM [facility] DEVICE REMOTES [facility]  04/10/2021  8:05 AM [facility] DEVICE REMOTES [facility]  07/10/2021  8:05 AM [facility] DEVICE REMOTES [facility]    Lab/Order associations:   ICD-10-CM   1. Type 1 diabetes mellitus with diabetic peripheral angiopathy without gangrene (HCC)  E10.51   2. Chronic systolic CHF (congestive heart failure) (HCC) Chronic I50.22 CBC with Differential/Platelet  3. Ventricular tachycardia (HCC)  Chronic I47.2   4. Hypothyroidism, unspecified type  E03.9 TSH  5. Stage 3 chronic kidney disease, unspecified whether stage 3a or 3b CKD (HCC)  N18.30   6. BPH associated with nocturia  N40.1 PSA   R35.1   7. Left groin hernia  K40.90 Ambulatory referral to General Surgery  8. Cardiomyopathy, ischemic  I25.5   9. CAD S/P LAD PCI'03, RCA DES 01/16/15  I25.10    Z98.61   10. Essential hypertension  I10   11. Dyslipidemia  E78.5   12. Erectile dysfunction, unspecified erectile dysfunction type  N52.9   13. Screen for colon cancer  Z12.11 Cologuard    Meds ordered this encounter  Medications  . tadalafil (CIALIS) 5 MG tablet    Sig: Take 1 tablet (5 mg total) by mouth daily. Patient need an office visit with Dr. Durene Cal for further refills.    Dispense:  90 tablet    Refill:  3   Return precautions advised.  Tana Conch, MD

## 2020-03-12 ENCOUNTER — Other Ambulatory Visit: Payer: Self-pay | Admitting: Cardiovascular Disease

## 2020-03-14 ENCOUNTER — Other Ambulatory Visit: Payer: Self-pay

## 2020-03-14 ENCOUNTER — Ambulatory Visit (INDEPENDENT_AMBULATORY_CARE_PROVIDER_SITE_OTHER): Payer: Managed Care, Other (non HMO) | Admitting: Family Medicine

## 2020-03-14 ENCOUNTER — Encounter: Payer: Self-pay | Admitting: Family Medicine

## 2020-03-14 VITALS — BP 104/76 | HR 67 | Temp 98.0°F | Ht 69.0 in | Wt 179.8 lb

## 2020-03-14 DIAGNOSIS — I2510 Atherosclerotic heart disease of native coronary artery without angina pectoris: Secondary | ICD-10-CM

## 2020-03-14 DIAGNOSIS — E1051 Type 1 diabetes mellitus with diabetic peripheral angiopathy without gangrene: Secondary | ICD-10-CM | POA: Diagnosis not present

## 2020-03-14 DIAGNOSIS — I255 Ischemic cardiomyopathy: Secondary | ICD-10-CM

## 2020-03-14 DIAGNOSIS — I10 Essential (primary) hypertension: Secondary | ICD-10-CM

## 2020-03-14 DIAGNOSIS — E039 Hypothyroidism, unspecified: Secondary | ICD-10-CM

## 2020-03-14 DIAGNOSIS — R351 Nocturia: Secondary | ICD-10-CM | POA: Diagnosis not present

## 2020-03-14 DIAGNOSIS — N529 Male erectile dysfunction, unspecified: Secondary | ICD-10-CM

## 2020-03-14 DIAGNOSIS — N183 Chronic kidney disease, stage 3 unspecified: Secondary | ICD-10-CM

## 2020-03-14 DIAGNOSIS — I472 Ventricular tachycardia, unspecified: Secondary | ICD-10-CM

## 2020-03-14 DIAGNOSIS — K4090 Unilateral inguinal hernia, without obstruction or gangrene, not specified as recurrent: Secondary | ICD-10-CM

## 2020-03-14 DIAGNOSIS — Z1211 Encounter for screening for malignant neoplasm of colon: Secondary | ICD-10-CM

## 2020-03-14 DIAGNOSIS — N401 Enlarged prostate with lower urinary tract symptoms: Secondary | ICD-10-CM

## 2020-03-14 DIAGNOSIS — Z9861 Coronary angioplasty status: Secondary | ICD-10-CM

## 2020-03-14 DIAGNOSIS — I5022 Chronic systolic (congestive) heart failure: Secondary | ICD-10-CM | POA: Diagnosis not present

## 2020-03-14 DIAGNOSIS — E785 Hyperlipidemia, unspecified: Secondary | ICD-10-CM

## 2020-03-14 LAB — PSA: PSA: 5.25 ng/mL — ABNORMAL HIGH (ref 0.10–4.00)

## 2020-03-14 LAB — CBC WITH DIFFERENTIAL/PLATELET
Basophils Absolute: 0 10*3/uL (ref 0.0–0.1)
Basophils Relative: 0.5 % (ref 0.0–3.0)
Eosinophils Absolute: 0.3 10*3/uL (ref 0.0–0.7)
Eosinophils Relative: 3.9 % (ref 0.0–5.0)
HCT: 48.3 % (ref 39.0–52.0)
Hemoglobin: 16.4 g/dL (ref 13.0–17.0)
Lymphocytes Relative: 19.4 % (ref 12.0–46.0)
Lymphs Abs: 1.6 10*3/uL (ref 0.7–4.0)
MCHC: 33.9 g/dL (ref 30.0–36.0)
MCV: 98.7 fl (ref 78.0–100.0)
Monocytes Absolute: 1.2 10*3/uL — ABNORMAL HIGH (ref 0.1–1.0)
Monocytes Relative: 13.9 % — ABNORMAL HIGH (ref 3.0–12.0)
Neutro Abs: 5.2 10*3/uL (ref 1.4–7.7)
Neutrophils Relative %: 62.3 % (ref 43.0–77.0)
Platelets: 186 10*3/uL (ref 150.0–400.0)
RBC: 4.89 Mil/uL (ref 4.22–5.81)
RDW: 13.4 % (ref 11.5–15.5)
WBC: 8.3 10*3/uL (ref 4.0–10.5)

## 2020-03-14 LAB — TSH: TSH: 4.42 u[IU]/mL (ref 0.35–4.50)

## 2020-03-14 MED ORDER — TADALAFIL 5 MG PO TABS: 5.0000 mg | ORAL_TABLET | Freq: Every day | ORAL | 3 refills | Status: DC

## 2020-03-15 ENCOUNTER — Other Ambulatory Visit: Payer: Self-pay

## 2020-03-15 MED ORDER — ENTRESTO 49-51 MG PO TABS: 1.0000 | ORAL_TABLET | Freq: Two times a day (BID) | ORAL | 0 refills | Status: DC

## 2020-03-16 ENCOUNTER — Encounter: Payer: Self-pay | Admitting: Endocrinology

## 2020-03-18 ENCOUNTER — Other Ambulatory Visit: Payer: Self-pay | Admitting: Endocrinology

## 2020-03-18 MED ORDER — INSULIN GLARGINE-YFGN 100 UNIT/ML ~~LOC~~ SOPN: 28.0000 [IU] | PEN_INJECTOR | Freq: Every day | SUBCUTANEOUS | 3 refills | Status: DC

## 2020-03-20 ENCOUNTER — Other Ambulatory Visit: Payer: Self-pay | Admitting: Cardiovascular Disease

## 2020-03-23 ENCOUNTER — Other Ambulatory Visit: Payer: Self-pay | Admitting: Cardiovascular Disease

## 2020-03-23 MED ORDER — ENTRESTO 49-51 MG PO TABS: 1.0000 | ORAL_TABLET | Freq: Two times a day (BID) | ORAL | 0 refills | Status: DC

## 2020-04-05 ENCOUNTER — Other Ambulatory Visit: Payer: Self-pay

## 2020-04-06 ENCOUNTER — Other Ambulatory Visit: Payer: Self-pay | Admitting: Endocrinology

## 2020-04-07 ENCOUNTER — Ambulatory Visit (INDEPENDENT_AMBULATORY_CARE_PROVIDER_SITE_OTHER): Payer: Managed Care, Other (non HMO) | Admitting: Student

## 2020-04-07 ENCOUNTER — Encounter: Payer: Self-pay | Admitting: Student

## 2020-04-07 ENCOUNTER — Other Ambulatory Visit: Payer: Self-pay

## 2020-04-07 ENCOUNTER — Ambulatory Visit (INDEPENDENT_AMBULATORY_CARE_PROVIDER_SITE_OTHER): Payer: Managed Care, Other (non HMO) | Admitting: Endocrinology

## 2020-04-07 VITALS — BP 120/68 | HR 63 | Ht 69.0 in | Wt 179.0 lb

## 2020-04-07 VITALS — BP 120/70 | HR 84 | Ht 69.0 in | Wt 179.8 lb

## 2020-04-07 DIAGNOSIS — E1051 Type 1 diabetes mellitus with diabetic peripheral angiopathy without gangrene: Secondary | ICD-10-CM | POA: Diagnosis not present

## 2020-04-07 DIAGNOSIS — I2510 Atherosclerotic heart disease of native coronary artery without angina pectoris: Secondary | ICD-10-CM

## 2020-04-07 DIAGNOSIS — I472 Ventricular tachycardia, unspecified: Secondary | ICD-10-CM

## 2020-04-07 DIAGNOSIS — Z794 Long term (current) use of insulin: Secondary | ICD-10-CM | POA: Diagnosis not present

## 2020-04-07 DIAGNOSIS — I5022 Chronic systolic (congestive) heart failure: Secondary | ICD-10-CM

## 2020-04-07 DIAGNOSIS — I255 Ischemic cardiomyopathy: Secondary | ICD-10-CM | POA: Diagnosis not present

## 2020-04-07 DIAGNOSIS — N183 Chronic kidney disease, stage 3 unspecified: Secondary | ICD-10-CM | POA: Diagnosis not present

## 2020-04-07 DIAGNOSIS — E1122 Type 2 diabetes mellitus with diabetic chronic kidney disease: Secondary | ICD-10-CM

## 2020-04-07 LAB — CUP PACEART INCLINIC DEVICE CHECK
Battery Remaining Longevity: 57 mo
Battery Voltage: 2.97 V
Brady Statistic AP VP Percent: 18.94 %
Brady Statistic AP VS Percent: 0.31 %
Brady Statistic AS VP Percent: 79.5 %
Brady Statistic AS VS Percent: 1.26 %
Brady Statistic RA Percent Paced: 19.19 %
Brady Statistic RV Percent Paced: 19.42 %
Date Time Interrogation Session: 20220304100517
HighPow Impedance: 62 Ohm
Implantable Lead Implant Date: 20171206
Implantable Lead Implant Date: 20171206
Implantable Lead Implant Date: 20171206
Implantable Lead Location: 753858
Implantable Lead Location: 753859
Implantable Lead Location: 753860
Implantable Lead Model: 4598
Implantable Lead Model: 5076
Implantable Pulse Generator Implant Date: 20171206
Lead Channel Impedance Value: 184.154
Lead Channel Impedance Value: 191.854
Lead Channel Impedance Value: 191.854
Lead Channel Impedance Value: 208.568
Lead Channel Impedance Value: 218.5 Ohm
Lead Channel Impedance Value: 323 Ohm
Lead Channel Impedance Value: 342 Ohm
Lead Channel Impedance Value: 399 Ohm
Lead Channel Impedance Value: 399 Ohm
Lead Channel Impedance Value: 437 Ohm
Lead Channel Impedance Value: 437 Ohm
Lead Channel Impedance Value: 437 Ohm
Lead Channel Impedance Value: 456 Ohm
Lead Channel Impedance Value: 665 Ohm
Lead Channel Impedance Value: 703 Ohm
Lead Channel Impedance Value: 722 Ohm
Lead Channel Impedance Value: 722 Ohm
Lead Channel Impedance Value: 760 Ohm
Lead Channel Pacing Threshold Amplitude: 0.5 V
Lead Channel Pacing Threshold Amplitude: 1 V
Lead Channel Pacing Threshold Amplitude: 1.125 V
Lead Channel Pacing Threshold Pulse Width: 0.4 ms
Lead Channel Pacing Threshold Pulse Width: 0.4 ms
Lead Channel Pacing Threshold Pulse Width: 0.4 ms
Lead Channel Sensing Intrinsic Amplitude: 13.875 mV
Lead Channel Sensing Intrinsic Amplitude: 15.125 mV
Lead Channel Sensing Intrinsic Amplitude: 2.5 mV
Lead Channel Sensing Intrinsic Amplitude: 3.125 mV
Lead Channel Setting Pacing Amplitude: 1.75 V
Lead Channel Setting Pacing Amplitude: 2 V
Lead Channel Setting Pacing Amplitude: 2.5 V
Lead Channel Setting Pacing Pulse Width: 0.4 ms
Lead Channel Setting Pacing Pulse Width: 0.4 ms
Lead Channel Setting Sensing Sensitivity: 0.3 mV

## 2020-04-07 LAB — POCT GLYCOSYLATED HEMOGLOBIN (HGB A1C): Hemoglobin A1C: 8.1 % — AB (ref 4.0–5.6)

## 2020-04-07 MED ORDER — INSULIN GLARGINE-YFGN 100 UNIT/ML ~~LOC~~ SOPN: 30.0000 [IU] | PEN_INJECTOR | Freq: Every day | SUBCUTANEOUS | 3 refills | Status: DC

## 2020-04-07 MED ORDER — ROSUVASTATIN CALCIUM 20 MG PO TABS: 20.0000 mg | ORAL_TABLET | Freq: Every day | ORAL | 3 refills | Status: DC

## 2020-04-07 MED ORDER — CARVEDILOL 6.25 MG PO TABS: 6.2500 mg | ORAL_TABLET | Freq: Two times a day (BID) | ORAL | 3 refills | Status: DC

## 2020-04-07 MED ORDER — EZETIMIBE 10 MG PO TABS: 10.0000 mg | ORAL_TABLET | Freq: Every day | ORAL | 3 refills | Status: DC

## 2020-04-07 MED ORDER — CLOPIDOGREL BISULFATE 75 MG PO TABS: 75.0000 mg | ORAL_TABLET | Freq: Every day | ORAL | 3 refills | Status: DC

## 2020-04-07 MED ORDER — DAPAGLIFLOZIN PROPANEDIOL 5 MG PO TABS: 5.0000 mg | ORAL_TABLET | Freq: Every day | ORAL | 3 refills | Status: DC

## 2020-04-07 MED ORDER — ENTRESTO 49-51 MG PO TABS: 1.0000 | ORAL_TABLET | Freq: Two times a day (BID) | ORAL | 3 refills | Status: DC

## 2020-04-07 NOTE — Patient Instructions (Addendum)
Please increase the Semglee to 30 units daily.   Please continue the same Novolog, but take less if any at lunch.   check your blood sugar twice a day.  vary the time of day when you check, between before the 3 meals, and at bedtime.  also check if you have symptoms of your blood sugar being too high or too low.  please keep a record of the readings and bring it to your next appointment here (or you can bring the meter itself).  You can write it on any piece of paper.  please call us sooner if your blood sugar goes below 70, or if you have a lot of readings over 200.  Please come back for a follow-up appointment in 3 months.

## 2020-04-07 NOTE — Patient Instructions (Signed)
Medication Instructions:  Your physician recommends that you continue on your current medications as directed. Please refer to the Current Medication list given to you today.  *If you need a refill on your cardiac medications before your next appointment, please call your pharmacy*   Lab Work: None Today If you have labs (blood work) drawn today and your tests are completely normal, you will receive your results only by: [name] MyChart Message (if you have MyChart) OR . A paper copy in the mail If you have any lab test that is abnormal or we need to change your treatment, we will call you to review the results.   Follow-Up: At [facility], you and your health needs are our priority.  As part of our continuing mission to provide you with exceptional heart care, we have created designated Provider Care Teams.  These Care Teams include your primary Cardiologist (physician) and Advanced Practice Providers (APPs -  Physician Assistants and Nurse Practitioners) who all work together to provide you with the care you need, when you need it.  Your next appointment:   6 month(s)  The format for your next appointment:   In Person  Provider:   Casimiro Needle "Otilio Saber, PA-C

## 2020-04-07 NOTE — Progress Notes (Signed)
Subjective:    Patient ID: John Carrillo, male    DOB: September 15, 1950, 70 y.o.   MRN: [id number]  HPI Pt returns for f/u of diabetes mellitus:  DM type: Insulin-requiring type 2.   Dx'ed: 2008. Complications: CVA, PAD, CRI, and CAD.   Therapy: insulin since soon after dx, and Farxiga.  DKA: never.  Severe hypoglycemia: never.   Pancreatitis: never.   Other: he declines pump, including V-GO; he declines continuous glucose monitor; he takes multiple daily injections, but basal insulin is emphasized, after poor results with usual multiple daily injections regimen; he declines GLP   Interval history:  Pt says he seldom misses the insulin. He reports mild hypoglycemia approx once per week.  This happens mid-afternoon.  Pt says cbg varies from 45-273.   Past Medical History:  Diagnosis Date  . Acute kidney injury superimposed on chronic kidney disease (HCC) 01/25/2015  . CORONARY ARTERY DISEASE 08/11/2006  . CVA 12/19/2006   2008  . DIABETES MELLITUS, TYPE I 08/11/2006  . ECZEMA, HANDS 12/15/2009  . HYPERLIPIDEMIA 08/11/2006  . HYPERTENSION 08/11/2006  . Type 2 diabetes mellitus without complications (HCC) 12/20/2016    Past Surgical History:  Procedure Laterality Date  . CARDIAC CATHETERIZATION N/A 01/16/2015   Procedure: Left Heart Cath and Coronary Angiography;  Surgeon: [name], MD;  Location: [facility] INVASIVE CV LAB;  Service: Cardiovascular;  Laterality: N/A;  . CORONARY STENT PLACEMENT    . EP IMPLANTABLE DEVICE N/A 01/10/2016   MDT Claria MRI Quad CRTD implanted by Dr [name]  . TEE WITHOUT CARDIOVERSION N/A 01/26/2015   Procedure: TRANSESOPHAGEAL ECHOCARDIOGRAM (TEE);  Surgeon: [name], MD;  Location: [facility] ENDOSCOPY;  Service: Cardiovascular;  Laterality: N/A;  . TONSILECTOMY, ADENOIDECTOMY, BILATERAL MYRINGOTOMY AND TUBES      Social History   Socioeconomic History  . Marital status: Married    Spouse name: Not on file  . Number of children: Not on file  . Years of  education: Not on file  . Highest education level: Not on file  Occupational History  . Occupation: Engineer, maintenance: [employer]  Tobacco Use  . Smoking status: Former Smoker    Packs/day: 1.00    Years: 30.00    Pack years: 30.00    Types: Cigarettes    Quit date: 05/03/2000    Years since quitting: 19.9  . Smokeless tobacco: Never Used  Vaping Use  . Vaping Use: Never used  Substance and Sexual Activity  . Alcohol use: Yes    Alcohol/week: 3.0 standard drinks    Types: 1 Glasses of wine, 1 Cans of beer, 1 Shots of liquor per week    Comment: 1-2/month  . Drug use: No  . Sexual activity: Not on file  Other Topics Concern  . Not on file  Social History Narrative   Married (kids are patients here, wife seen elsewhere). 3 step children, 1 biological. No grandkids.    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   Oldest daughter [name] - college of Patent attorney- social work intl business minor religion- now trying to get foot in door at [employer]- wants to be in Midwife for general dynamics- consider age 22 retirement      Hobbies: race sail boats, adrenaline related activities   Social Determinants of Corporate investment banker Strain: Not on file  Food Insecurity: Not on file  Transportation Needs: Not on file  Physical Activity: Not on file  Stress: Not on file  Social Connections: Not on file  Intimate Partner Violence: Not on file    Current Outpatient Medications on File Prior to Visit  Medication Sig Dispense Refill  . acetaminophen (TYLENOL) 325 MG tablet Take 2 tablets (650 mg total) by mouth every 4 (four) hours as needed for headache or mild pain.    . Coenzyme Q10 60 MG CAPS Take 1 capsule by mouth daily.     [name] glucose blood (ONETOUCH VERIO) test strip 1 each by Other route 2 (two) times daily. And lancets 2/day 200 each 12  . insulin aspart  (NOVOLOG FLEXPEN) 100 UNIT/ML FlexPen Inject 6-8 Units into the skin 3 (three) times daily with meals. 30 mL 3  . levothyroxine (SYNTHROID) 100 MCG tablet TAKE 1 TABLET(100 MCG) BY MOUTH DAILY 90 tablet 3  . Probiotic Product (PROBIOTIC PO) Take 1 capsule by mouth every morning.    . tadalafil (CIALIS) 5 MG tablet Take 1 tablet (5 mg total) by mouth daily. Patient need an office visit with Dr. Durene Cal for further refills. 90 tablet 3   No current facility-administered medications on file prior to visit.    No Known Allergies  Family History  Problem Relation Age of Onset  . CAD Father   . Other Mother        passed right before 29- tumor on kidney recent diagnosis  . Dementia Mother        progressing rapidly  . CAD Paternal Grandfather        28s  . CAD Paternal Grandmother        62s  . Brain cancer Maternal Grandmother   . Lymphoma Sister        b cell  . Celiac disease Sister   . Diabetes Neg Hx     BP 120/70 (BP Location: Right Arm, Patient Position: Sitting, Cuff Size: Normal)   Pulse 84   Ht 5\' 9"  (1.753 m)   Wt 179 lb 12.8 oz (81.6 kg)   SpO2 98%   BMI 26.55 kg/m    Review of Systems He denies LOC.     Objective:   Physical Exam VITAL SIGNS:  See vs page GENERAL: no distress Pulses: dorsalis pedis intact bilat.   MSK: no deformity of the feet CV: no leg edema Skin:  no ulcer on the feet.  normal color and temp on the feet. Neuro: sensation is intact to touch on the feet    Lab Results  Component Value Date   HGBA1C 8.1 (A) 04/07/2020       Assessment & Plan:  Type 1 DM, with PAD: uncontrolled Hypoglycemia, due to insulin: reduce lunch Novolog   Patient Instructions  Please increase the Semglee to 30 units daily.   Please continue the same Novolog, but take less if any at lunch.   check your blood sugar twice a day.  vary the time of day when you check, between before the 3 meals, and at bedtime.  also check if you have symptoms of your blood  sugar being too high or too low.  please keep a record of the readings and bring it to your next appointment here (or you can bring the meter itself).  You can write it on any piece of paper.  please call 06/07/2020 sooner if your blood sugar goes below 70, or if you have a lot of  readings over 200.  Please come back for a follow-up appointment in 3 months.

## 2020-04-07 NOTE — Progress Notes (Signed)
Electrophysiology Office Note Date: 04/07/2020  ID:  John Carrillo, DOB 1950-10-19, MRN [id number]  PCP: [name], MD Primary Cardiologist: No primary care provider on file. Electrophysiologist: Hillis Range, MD   CC: Routine ICD follow-up  John Carrillo is a 70 y.o. male seen today for Hillis Range, MD for routine electrophysiology followup.  Since last being seen in our clinic the patient reports doing very well. He has been working from home for the past 2 years and has been very conservative re: COVID restrictions. He misses sail boat racing, but will hopefully get to start back up this year.  he denies chest pain, palpitations, dyspnea, PND, orthopnea, nausea, vomiting, dizziness, syncope, edema, weight gain, or early satiety. He has not had ICD shocks.   Device History: MDT CRTD implanted 2017 for ICM, CHF History of appropriate therapy: No History of AAD therapy: No AV opt done by Gypsy Balsam 12/2016 - best with adaptiveCRT on (no programming changes made)  Past Medical History:  Diagnosis Date  . Acute kidney injury superimposed on chronic kidney disease (HCC) 01/25/2015  . CORONARY ARTERY DISEASE 08/11/2006  . CVA 12/19/2006   2008  . DIABETES MELLITUS, TYPE I 08/11/2006  . ECZEMA, HANDS 12/15/2009  . HYPERLIPIDEMIA 08/11/2006  . HYPERTENSION 08/11/2006  . Type 2 diabetes mellitus without complications (HCC) 12/20/2016   Past Surgical History:  Procedure Laterality Date  . CARDIAC CATHETERIZATION N/A 01/16/2015   Procedure: Left Heart Cath and Coronary Angiography;  Surgeon: [name], MD;  Location: [facility] INVASIVE CV LAB;  Service: Cardiovascular;  Laterality: N/A;  . CORONARY STENT PLACEMENT    . EP IMPLANTABLE DEVICE N/A 01/10/2016   MDT Claria MRI Quad CRTD implanted by Dr [name]  . TEE WITHOUT CARDIOVERSION N/A 01/26/2015   Procedure: TRANSESOPHAGEAL ECHOCARDIOGRAM (TEE);  Surgeon: [name], MD;  Location: [facility] ENDOSCOPY;  Service: Cardiovascular;   Laterality: N/A;  . TONSILECTOMY, ADENOIDECTOMY, BILATERAL MYRINGOTOMY AND TUBES      Current Outpatient Medications  Medication Sig Dispense Refill  . acetaminophen (TYLENOL) 325 MG tablet Take 2 tablets (650 mg total) by mouth every 4 (four) hours as needed for headache or mild pain.    . Coenzyme Q10 60 MG CAPS Take 1 capsule by mouth daily.     [name] glucose blood (ONETOUCH VERIO) test strip 1 each by Other route 2 (two) times daily. And lancets 2/day 200 each 12  . insulin aspart (NOVOLOG FLEXPEN) 100 UNIT/ML FlexPen Inject 6-8 Units into the skin 3 (three) times daily with meals. 30 mL 3  . Insulin Glargine-yfgn (SEMGLEE, YFGN,) 100 UNIT/ML SOPN Inject 30 Units into the skin daily with breakfast. 45 mL 3  . levothyroxine (SYNTHROID) 100 MCG tablet TAKE 1 TABLET(100 MCG) BY MOUTH DAILY 90 tablet 3  . Probiotic Product (PROBIOTIC PO) Take 1 capsule by mouth every morning.    . tadalafil (CIALIS) 5 MG tablet Take 1 tablet (5 mg total) by mouth daily. Patient need an office visit with Dr. Durene Cal for further refills. 90 tablet 3  . carvedilol (COREG) 6.25 MG tablet Take 1 tablet (6.25 mg total) by mouth 2 (two) times daily. 180 tablet 3  . clopidogrel (PLAVIX) 75 MG tablet Take 1 tablet (75 mg total) by mouth daily. 90 tablet 3  . dapagliflozin propanediol (FARXIGA) 5 MG TABS tablet Take 1 tablet (5 mg total) by mouth daily before breakfast. 90 tablet 3  . ezetimibe (ZETIA) 10 MG tablet Take 1 tablet (10 mg total)  by mouth daily. 90 tablet 3  . rosuvastatin (CRESTOR) 20 MG tablet Take 1 tablet (20 mg total) by mouth daily. 90 tablet 3  . sacubitril-valsartan (ENTRESTO) 49-51 MG Take 1 tablet by mouth 2 (two) times daily. 180 tablet 3   No current facility-administered medications for this visit.    Allergies:   Patient has no known allergies.   Social History: Social History   Socioeconomic History  . Marital status: Married    Spouse name: Not on file  . Number of children: Not on file   . Years of education: Not on file  . Highest education level: Not on file  Occupational History  . Occupation: Engineer, maintenance: [employer]  Tobacco Use  . Smoking status: Former Smoker    Packs/day: 1.00    Years: 30.00    Pack years: 30.00    Types: Cigarettes    Quit date: 05/03/2000    Years since quitting: 19.9  . Smokeless tobacco: Never Used  Vaping Use  . Vaping Use: Never used  Substance and Sexual Activity  . Alcohol use: Yes    Alcohol/week: 3.0 standard drinks    Types: 1 Glasses of wine, 1 Cans of beer, 1 Shots of liquor per week    Comment: 1-2/month  . Drug use: No  . Sexual activity: Not on file  Other Topics Concern  . Not on file  Social History Narrative   Married (kids are patients here, wife seen elsewhere). 3 step children, 1 biological. No grandkids.    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   Oldest daughter [name] - college of Patent attorney- social work intl business minor religion- now trying to get foot in door at [employer]- wants to be in Midwife for general dynamics- consider age 2 retirement      Hobbies: race sail boats, adrenaline related activities   Social Determinants of Corporate investment banker Strain: Not on Ship broker Insecurity: Not on file  Transportation Needs: Not on file  Physical Activity: Not on file  Stress: Not on file  Social Connections: Not on file  Intimate Partner Violence: Not on file    Family History: Family History  Problem Relation Age of Onset  . CAD Father   . Other Mother        passed right before 48- tumor on kidney recent diagnosis  . Dementia Mother        progressing rapidly  . CAD Paternal Grandfather        42s  . CAD Paternal Grandmother        70s  . Brain cancer Maternal Grandmother   . Lymphoma Sister        b cell  . Celiac disease Sister   . Diabetes Neg Hx     Review of Systems: All other systems reviewed and are otherwise negative except as noted above.  Physical Exam: Vitals:   04/07/20 0853  BP: 120/68  Pulse: 63  SpO2: 97%  Weight: 179 lb (81.2 kg)  Height: 5\' 9"  (1.753 m)    GEN- The patient is well appearing, alert and oriented x 3 today.   HEENT: normocephalic, atraumatic; sclera clear, conjunctiva pink; hearing intact; oropharynx clear; neck supple, no JVP Lymph- no cervical lymphadenopathy Lungs- Clear to ausculation bilaterally, normal work of breathing.  No  wheezes, rales, rhonchi Heart- Regular rate and rhythm, no murmurs, rubs or gallops, PMI not laterally displaced GI- soft, non-tender, non-distended, bowel sounds present, no hepatosplenomegaly Extremities- no clubbing or cyanosis. No edema; DP/PT/radial pulses 2+ bilaterally MS- no significant deformity or atrophy Skin- warm and dry, no rash or lesion; ICD pocket well healed Psych- euthymic mood, full affect Neuro- strength and sensation are intact  ICD interrogation- reviewed in detail today,  See PACEART report  Recent Labs: 01/14/2020: ALT 39; BUN 24; Creatinine, Ser 1.29; Potassium 4.5; Sodium 143 03/14/2020: Hemoglobin 16.4; Platelets 186.0; TSH 4.42   Wt Readings from Last 3 Encounters:  04/07/20 179 lb (81.2 kg)  04/07/20 179 lb 12.8 oz (81.6 kg)  03/14/20 179 lb 12.8 oz (81.6 kg)     Other studies Reviewed: Additional studies/ records that were reviewed today include: Previous EP office notes   Assessment and Plan:  1.  Chronic systolic dysfunction s/p Medtronic CRT-D  euvolemic today Stable on an appropriate medical regimen of BB, MRA, ARNI, and SGLT2i. Normal ICD function See Arita Miss Art report No changes today Stable labs reviewed from 01/14/20.  2. CAD Denies s/s ischemia  3. Prior CVA No AF to date on device interrogation  4. Sustained VT No recurrence  5. DM2 Hgb A1c 04/07/20 today. Per PCP.   Current medicines are reviewed at length  with the patient today.   The patient does not have concerns regarding his medicines.  The following changes were made today:  none  Labs/ tests ordered today include:  No orders of the defined types were placed in this encounter.   Disposition:   Follow up with EP APP  6 months   Signed, Graciella Freer, PA-C  04/07/2020 10:06 AM  [facility] [address] [place] [address] [phone] (office) [phone] (fax)

## 2020-04-11 ENCOUNTER — Ambulatory Visit (INDEPENDENT_AMBULATORY_CARE_PROVIDER_SITE_OTHER): Payer: Managed Care, Other (non HMO)

## 2020-04-11 DIAGNOSIS — I255 Ischemic cardiomyopathy: Secondary | ICD-10-CM

## 2020-04-11 LAB — CUP PACEART REMOTE DEVICE CHECK
Battery Remaining Longevity: 56 mo
Battery Voltage: 2.97 V
Brady Statistic AP VP Percent: 8.08 %
Brady Statistic AP VS Percent: 0.11 %
Brady Statistic AS VP Percent: 90.39 %
Brady Statistic AS VS Percent: 1.42 %
Brady Statistic RA Percent Paced: 8.16 %
Brady Statistic RV Percent Paced: 10.16 %
Date Time Interrogation Session: 20220308001703
HighPow Impedance: 64 Ohm
Implantable Lead Implant Date: 20171206
Implantable Lead Implant Date: 20171206
Implantable Lead Implant Date: 20171206
Implantable Lead Location: 753858
Implantable Lead Location: 753859
Implantable Lead Location: 753860
Implantable Lead Model: 4598
Implantable Lead Model: 5076
Implantable Pulse Generator Implant Date: 20171206
Lead Channel Impedance Value: 180 Ohm
Lead Channel Impedance Value: 184.154
Lead Channel Impedance Value: 191.854
Lead Channel Impedance Value: 194.634
Lead Channel Impedance Value: 208.568
Lead Channel Impedance Value: 304 Ohm
Lead Channel Impedance Value: 342 Ohm
Lead Channel Impedance Value: 380 Ohm
Lead Channel Impedance Value: 380 Ohm
Lead Channel Impedance Value: 399 Ohm
Lead Channel Impedance Value: 399 Ohm
Lead Channel Impedance Value: 437 Ohm
Lead Channel Impedance Value: 437 Ohm
Lead Channel Impedance Value: 627 Ohm
Lead Channel Impedance Value: 665 Ohm
Lead Channel Impedance Value: 703 Ohm
Lead Channel Impedance Value: 703 Ohm
Lead Channel Impedance Value: 703 Ohm
Lead Channel Pacing Threshold Amplitude: 0.5 V
Lead Channel Pacing Threshold Amplitude: 1 V
Lead Channel Pacing Threshold Amplitude: 1.25 V
Lead Channel Pacing Threshold Pulse Width: 0.4 ms
Lead Channel Pacing Threshold Pulse Width: 0.4 ms
Lead Channel Pacing Threshold Pulse Width: 0.4 ms
Lead Channel Sensing Intrinsic Amplitude: 11.125 mV
Lead Channel Sensing Intrinsic Amplitude: 11.125 mV
Lead Channel Sensing Intrinsic Amplitude: 2.25 mV
Lead Channel Sensing Intrinsic Amplitude: 2.25 mV
Lead Channel Setting Pacing Amplitude: 1.75 V
Lead Channel Setting Pacing Amplitude: 2 V
Lead Channel Setting Pacing Amplitude: 2.5 V
Lead Channel Setting Pacing Pulse Width: 0.4 ms
Lead Channel Setting Pacing Pulse Width: 0.4 ms
Lead Channel Setting Sensing Sensitivity: 0.3 mV

## 2020-04-20 ENCOUNTER — Other Ambulatory Visit: Payer: Self-pay | Admitting: Cardiovascular Disease

## 2020-04-20 NOTE — Progress Notes (Addendum)
Cardiology Office Note:    Date:  04/21/2020   ID:  Jethro Bolus, DOB 1950-09-20, MRN [id number]  PCP:  [name], MD   [facility]  Cardiologist:  Charlton Haws, MD  Electrophysiologist:  Hillis Range, MD       Referring MD: [name], MD   Chief Complaint:  Follow-up (CAD, CHF)    Patient Profile:    John Carrillo is a 70 y.o. male with:   (HFrEF) heart failure with reduced ejection fraction   Ischemic CM   Echocardiogram 12/15: EF 15  Echocardiogram 7/19: EF 30-35  Coronary artery disease   S/p anterior MI (remote) s/p stent to LAD  NSTEMI in 2016 s/p DES to RCA   Hx of CVA in 12/16 (R occipital lobe)  VTach  S/p CRT-D  LBBB  Diabetes mellitus   Hypertension   Hyperlipidemia   Hypothyroidism   Prior CV studies:  Echocardiogram 08/15/17 EF 30-35, multiple WMA, Gr 2 DD, mod focal basal and mild conc LVH, ascending aorta 40 mm, mild LAE, trivial eff  Cardiac catheterization 01/16/15 LAD stent patent RCA mid 95 EF < 25 PCI: 3.5 x 20 Synergy DES to RCA   History of Present Illness:    John Carrillo was last seen by Dr. [name] in 1/21.  He returns for f/u.  He is here alone.  He is overall doing well.  He has not had chest discomfort, shortness of breath, syncope, orthopnea or leg edema.  He has developed a left inguinal hernia.  He sees a Careers adviser soon to discuss possible repair.  He is somewhat concerned as he has had a stroke each time he has come off of antiplatelet therapy.  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       Past Medical History:  Diagnosis Date  . Acute kidney injury superimposed on chronic kidney disease (HCC) 01/25/2015  . CORONARY ARTERY DISEASE 08/11/2006  . CVA 12/19/2006   2008  . DIABETES MELLITUS, TYPE I 08/11/2006  . ECZEMA, HANDS 12/15/2009  . HYPERLIPIDEMIA 08/11/2006  . HYPERTENSION 08/11/2006  . Type 2 diabetes  mellitus without complications (HCC) 12/20/2016    Current Medications: Current Meds  Medication Sig  . acetaminophen (TYLENOL) 325 MG tablet Take 2 tablets (650 mg total) by mouth every 4 (four) hours as needed for headache or mild pain.  . carvedilol (COREG) 6.25 MG tablet Take 1 tablet (6.25 mg total) by mouth 2 (two) times daily.  . clopidogrel (PLAVIX) 75 MG tablet Take 1 tablet (75 mg total) by mouth daily.  . Coenzyme Q10 60 MG CAPS Take 1 capsule by mouth daily.   . dapagliflozin propanediol (FARXIGA) 5 MG TABS tablet Take 1 tablet (5 mg total) by mouth daily before breakfast.  . ezetimibe (ZETIA) 10 MG tablet Take 1 tablet (10 mg total) by mouth daily.  [name] glucose blood (ONETOUCH VERIO) test strip 1 each by Other route 2 (two) times daily. And lancets 2/day  . insulin aspart (NOVOLOG FLEXPEN) 100 UNIT/ML FlexPen Inject 6-8 Units into the skin 3 (three) times daily with meals.  . Insulin Glargine-yfgn (SEMGLEE, YFGN,) 100 UNIT/ML SOPN Inject 30 Units into the skin daily with breakfast.  . levothyroxine (SYNTHROID) 100 MCG tablet TAKE 1 TABLET(100 MCG) BY MOUTH DAILY  . Probiotic Product (PROBIOTIC PO) Take 1 capsule by mouth every morning.  . rosuvastatin (CRESTOR) 20 MG tablet  Take 1 tablet (20 mg total) by mouth daily.  . sacubitril-valsartan (ENTRESTO) 49-51 MG Take 1 tablet by mouth 2 (two) times daily.  . tadalafil (CIALIS) 5 MG tablet Take 1 tablet (5 mg total) by mouth daily. Patient need an office visit with Dr. Durene Cal for further refills.     Allergies:   Patient has no known allergies.   Social History   Tobacco Use  . Smoking status: Former Smoker    Packs/day: 1.00    Years: 30.00    Pack years: 30.00    Types: Cigarettes    Quit date: 05/03/2000    Years since quitting: 19.9  . Smokeless tobacco: Never Used  Vaping Use  . Vaping Use: Never used  Substance Use Topics  . Alcohol use: Yes    Alcohol/week: 3.0 standard drinks    Types: 1 Glasses of wine, 1 Cans  of beer, 1 Shots of liquor per week    Comment: 1-2/month  . Drug use: No     Family Hx: The patient's family history includes Brain cancer in his maternal grandmother; CAD in his father, paternal grandfather, and paternal grandmother; Celiac disease in his sister; Dementia in his mother; Lymphoma in his sister; Other in his mother. There is no history of Diabetes.  ROS   EKGs/Labs/Other Test Reviewed:    EKG:  EKG is  ordered today.  The ekg ordered today demonstrates Underlying sinus rhythm, HR 63, ventricular paced   Recent Labs: 01/14/2020: ALT 39; BUN 24; Creatinine, Ser 1.29; Potassium 4.5; Sodium 143 03/14/2020: Hemoglobin 16.4; Platelets 186.0; TSH 4.42   Recent Lipid Panel Lab Results  Component Value Date/Time   CHOL 122 01/14/2020 07:56 AM   CHOL 125 12/17/2019 07:31 AM   TRIG 65.0 01/14/2020 07:56 AM   HDL 50.80 01/14/2020 07:56 AM   HDL 48 12/17/2019 07:31 AM   CHOLHDL 2 01/14/2020 07:56 AM   LDLCALC 58 01/14/2020 07:56 AM   LDLCALC 59 12/17/2019 07:31 AM   LDLDIRECT 85.0 07/11/2017 08:39 AM      Risk Assessment/Calculations:      Physical Exam:    VS:  BP 108/70   Pulse 63   Ht 5\' 9"  (1.753 m)   Wt 182 lb 3.2 oz (82.6 kg)   SpO2 97%   BMI 26.91 kg/m     Wt Readings from Last 3 Encounters:  04/21/20 182 lb 3.2 oz (82.6 kg)  04/07/20 179 lb (81.2 kg)  04/07/20 179 lb 12.8 oz (81.6 kg)     Constitutional:      Appearance: Healthy appearance. Not in distress.  Neck:     Vascular: JVD normal.  Pulmonary:     Effort: Pulmonary effort is normal.     Breath sounds: No wheezing. No rales.  Cardiovascular:     Normal rate. Regular rhythm. Normal S1. Normal S2.     Murmurs: There is no murmur.  Pulses:    Intact distal pulses.  Edema:    Peripheral edema absent.  Abdominal:     Palpations: Abdomen is soft. There is no hepatomegaly.  Skin:    General: Skin is warm and dry.  Neurological:     General: No focal deficit present.     Mental Status:  Alert and oriented to person, place and time.     Cranial Nerves: Cranial nerves are intact.       ASSESSMENT & PLAN:    1. HFrEF (heart failure with reduced ejection fraction) (HCC) EF 30-35.  Ischemic  cardiomyopathy.  NYHA II.  Volume status stable.  He is overall doing well from a heart failure standpoint.  Continue current dose of carvedilol, dapagliflozin, sacubitril/valsartan.  Follow-up 1 year.  2. Coronary artery disease involving native coronary artery of native heart without angina pectoris History of remote anterior MI treated with stenting to the LAD and non-STEMI in 2016 treated with a DES to the RCA.  He is doing well without anginal symptoms.  Continue clopidogrel, ezetimibe, rosuvastatin, carvedilol.  3. Biventricular ICD (implantable cardioverter-defibrillator) in place Continue follow-up with EP.  4. Essential hypertension The patient's blood pressure is controlled on his current regimen.  Continue current therapy.   5. Mixed hyperlipidemia LDL optimal on most recent lab work.  Continue current Rx.    6.  Surgical clearance   Mr. Casimir perioperative risk of a major cardiac event is 11% according to the Revised Cardiac Risk Index (RCRI).  Therefore, he is at high risk for perioperative complications.   His functional capacity is good at 5.62 METs according to the Duke Activity Status Index (DASI). Recommendations:  According to ACC/AHA guidelines, no further cardiovascular testing needed.  The patient may proceed to surgery at acceptable risk.   Antiplatelet and/or Anticoagulation Recommendations:  Clopidogrel (Plavix) can be held for 5 days prior to his surgery and resumed as soon as possible post op.  If possible, we would recommend starting aspirin 81 mg daily (while off of clopidogrel) and continuing this throughout the perioperative period to reduce risk of CV event.  I will review with Dr. [name] to see if we need to take any other precautions to reduce the  risk of recurrent CVA in the perioperative period.   Dispo:  Return in about 1 year (around 04/21/2021) for 1 year routine follow up with Dr. [name].   Medication Adjustments/Labs and Tests Ordered: Current medicines are reviewed at length with the patient today.  Concerns regarding medicines are outlined above.  Tests Ordered: Orders Placed This Encounter  Procedures  . EKG 12-Lead   Medication Changes: No orders of the defined types were placed in this encounter.   Signed, Tereso Newcomer, PA-C  04/21/2020 8:43 AM    [facility] [address] [place], [place], [place]  [address] Phone: [phone]; Fax: [phone]   ADDENDUM Reviewed with Dr. [name].  He agreed with recommendations above.  The patient can hold clodpidogrel (Plavix) for 5 days prior to surgery.  We recommend he take ASA 81 mg once daily while off of clopidogrel if acceptable from a bleeding perspective.  He can stop ASA and resume clopidogrel post op when felt to be safe. Tereso Newcomer, PA-C    04/24/2020 11:22 AM

## 2020-04-20 NOTE — Progress Notes (Signed)
Remote ICD transmission.   

## 2020-04-21 ENCOUNTER — Other Ambulatory Visit: Payer: Self-pay | Admitting: Surgery

## 2020-04-21 ENCOUNTER — Ambulatory Visit (INDEPENDENT_AMBULATORY_CARE_PROVIDER_SITE_OTHER): Payer: Managed Care, Other (non HMO) | Admitting: Physician Assistant

## 2020-04-21 ENCOUNTER — Encounter: Payer: Self-pay | Admitting: Physician Assistant

## 2020-04-21 ENCOUNTER — Other Ambulatory Visit: Payer: Self-pay

## 2020-04-21 ENCOUNTER — Telehealth: Payer: Self-pay | Admitting: *Deleted

## 2020-04-21 VITALS — BP 108/70 | HR 63 | Ht 69.0 in | Wt 182.2 lb

## 2020-04-21 DIAGNOSIS — I5020 Unspecified systolic (congestive) heart failure: Secondary | ICD-10-CM

## 2020-04-21 DIAGNOSIS — Z95810 Presence of automatic (implantable) cardiac defibrillator: Secondary | ICD-10-CM | POA: Diagnosis not present

## 2020-04-21 DIAGNOSIS — E782 Mixed hyperlipidemia: Secondary | ICD-10-CM

## 2020-04-21 DIAGNOSIS — I2510 Atherosclerotic heart disease of native coronary artery without angina pectoris: Secondary | ICD-10-CM

## 2020-04-21 DIAGNOSIS — Z01810 Encounter for preprocedural cardiovascular examination: Secondary | ICD-10-CM

## 2020-04-21 DIAGNOSIS — I10 Essential (primary) hypertension: Secondary | ICD-10-CM | POA: Diagnosis not present

## 2020-04-21 NOTE — Telephone Encounter (Signed)
   [facility] Pre-operative Risk Assessment    HEARTCARE STAFF: - Please ensure there is not already an duplicate clearance open for this procedure. - Under Visit Info/Reason for Call, type in Other and utilize the format Clearance MM/DD/YY or Clearance TBD. Do not use dashes or single digits. - If request is for dental extraction, please clarify the # of teeth to be extracted.  Request for surgical clearance:  1. What type of surgery is being performed? INGUINAL HERNIA REPAIR WITH MESH   2. When is this surgery scheduled? TBD   3. What type of clearance is required (medical clearance vs. Pharmacy clearance to hold med vs. Both)? MEDICAL  4. Are there any medications that need to be held prior to surgery and how long? PLAVIX   5. Practice name and name of physician performing surgery? [facility]; DR. Coralie Keens    6. What is the office phone number? [phone]   7.   What is the office fax number? [phone]  8.   Anesthesia type (None, local, MAC, general) ? LIGHTER ANESTHESIA & A TAP BLOCK    John Carrillo 04/21/2020, 12:27 PM  _________________________________________________________________   (provider comments below)

## 2020-04-21 NOTE — Telephone Encounter (Signed)
I think okay to hold plavix up to 5 days. Last multiple ECG's are sinus or A- pace.

## 2020-04-21 NOTE — Patient Instructions (Signed)
Medication Instructions:  Your physician recommends that you continue on your current medications as directed. Please refer to the Current Medication list given to you today.  *If you need a refill on your cardiac medications before your next appointment, please call your pharmacy*   Lab Work: None   If you have labs (blood work) drawn today and your tests are completely normal, you will receive your results only by: . MyChart Message (if you have MyChart) OR . A paper copy in the mail If you have any lab test that is abnormal or we need to change your treatment, we will call you to review the results.   Testing/Procedures: None   Follow-Up: At [facility], you and your health needs are our priority.  As part of our continuing mission to provide you with exceptional heart care, we have created designated Provider Care Teams.  These Care Teams include your primary Cardiologist (physician) and Advanced Practice Providers (APPs -  Physician Assistants and Nurse Practitioners) who all work together to provide you with the care you need, when you need it.  We recommend signing up for the patient portal called "MyChart".  Sign up information is provided on this After Visit Summary.  MyChart is used to connect with patients for Virtual Visits (Telemedicine).  Patients are able to view lab/test results, encounter notes, upcoming appointments, etc.  Non-urgent messages can be sent to your provider as well.   To learn more about what you can do with MyChart, go to [email or web address].    Your next appointment:   1 year(s)  The format for your next appointment:   In Person  Provider:   Peter Nishan, MD   Other Instructions Your physician wants you to follow-up in: 1 year with  Dr. Nishan.  You will receive a reminder letter in the mail two months in advance. If you don't receive a letter, please call our office to schedule the follow-up appointment.   

## 2020-04-24 NOTE — Telephone Encounter (Signed)
Reviewed with Dr. [name] and Dr. Katrinka Blazing. Please call the patient to let him know the following: It is okay to hold clopidogrel (Plavix) for 5 days prior to surgery. I recommend taking aspirin 81 mg daily while off of clopidogrel.  The patient should discuss this with his surgeon to see if it is acceptable from a bleeding perspective.  If the surgeon feels the bleeding risk is too great, he should just stop clopidogrel 5 days prior. Postoperatively, he can stop aspirin and resume clopidogrel when cleared by the surgeon. I have faxed my note to his surgeon. Tereso Newcomer, PA-C 04/24/2020 11:24 AM

## 2020-04-24 NOTE — Telephone Encounter (Signed)
S/w pt is aware of recommendations.  Aware note was faxed to surgeon.

## 2020-04-25 ENCOUNTER — Other Ambulatory Visit: Payer: Self-pay | Admitting: Cardiovascular Disease

## 2020-05-17 ENCOUNTER — Encounter: Payer: Self-pay | Admitting: Internal Medicine

## 2020-05-17 NOTE — Progress Notes (Signed)
Surgical Instructions    Your procedure is scheduled on 05/22/20.  Report to [facility] Main Entrance "A" at 11:15 A.M., then check in with the Admitting office.  Call this number if you have problems the morning of surgery:  [phone]   If you have any questions prior to your surgery date call [phone]: Open Monday-Friday 8am-4pm    Remember:  Do not eat after midnight the night before your surgery  You may drink clear liquids until 10:15am the morning of your surgery.   Clear liquids allowed are: Water, Non-Citrus Juices (without pulp), Carbonated Beverages, Clear Tea, Black Coffee Only, and Gatorade  Patient Instructions  . The day of surgery (if you have diabetes): o Drink ONE (1) 10 oz water bottle given to you in your pre admission testing appointment by 10:15am the morning of surgery. Drink in one sitting. Do not sip.  o This drink was given to you during your hospital  pre-op appointment visit.  o Nothing else to drink after completing the  10 oz bottle of water.          If you have questions, please contact your surgeon's office.     Take these medicines the morning of surgery with A SIP OF WATER  acetaminophen (TYLENOL) if needed carvedilol (COREG) ezetimibe (ZETIA) levothyroxine (SYNTHROID)   As of today, STOP taking any Aspirin (unless otherwise instructed by your surgeon) Aleve, Naproxen, Ibuprofen, Motrin, Advil, Goody's, BC's, all herbal medications, fish oil, and all vitamins. Do not take clopidogrel (PLAVIX)  5 days prior to surgery. Your last dose will be 05/16/20.   WHAT DO I DO ABOUT MY DIABETES MEDICATION?   [name] Do not take oral diabetes medicines (pills) the morning of surgery.  . THE DAY BEFORE SURGERY, do not take dapagliflozin propanediol (FARXIGA) . Take 15 units          Insulin Glargine.   . THE MORNING OF SURGERY, do not take dapagliflozin propanediol (FARXIGA) and insulin aspart (NOVOLOG FLEXPEN). Take 15 units  Insulin Glargine.    . The day of surgery, do not take other diabetes injectables, including Byetta (exenatide), Bydureon (exenatide ER), Victoza (liraglutide), or Trulicity (dulaglutide).  . If your CBG is greater than 220 mg/dL, you may take  of your sliding scale (correction) dose of insulin.   HOW TO MANAGE YOUR DIABETES BEFORE AND AFTER SURGERY  Why is it important to control my blood sugar before and after surgery? . Improving blood sugar levels before and after surgery helps healing and can limit problems. . A way of improving blood sugar control is eating a healthy diet by: o  Eating less sugar and carbohydrates o  Increasing activity/exercise o  Talking with your doctor about reaching your blood sugar goals . High blood sugars (greater than 180 mg/dL) can raise your risk of infections and slow your recovery, so you will need to focus on controlling your diabetes during the weeks before surgery. . Make sure that the doctor who takes care of your diabetes knows about your planned surgery including the date and location.  How do I manage my blood sugar before surgery? . Check your blood sugar at least 4 times a day, starting 2 days before surgery, to make sure that the level is not too high or low. . Check your blood sugar the morning of your surgery when you wake up and every 2 hours until you get to the Short Stay unit. o If your blood sugar is less than 70  mg/dL, you will need to treat for low blood sugar: - Do not take insulin. - Treat a low blood sugar (less than 70 mg/dL) with  cup of clear juice (cranberry or apple), 4 glucose tablets, OR glucose gel. - Recheck blood sugar in 15 minutes after treatment (to make sure it is greater than 70 mg/dL). If your blood sugar is not greater than 70 mg/dL on recheck, call [phone] for further instructions. . Report your blood sugar to the short stay nurse when you get to Short Stay.  . If you are admitted to the hospital after surgery: o Your blood  sugar will be checked by the staff and you will probably be given insulin after surgery (instead of oral diabetes medicines) to make sure you have good blood sugar levels. o The goal for blood sugar control after surgery is 80-180 mg/dL.                      Do not wear jewelry, make up, or nail polish            Do not wear lotions, powders, perfumes/colognes, or deodorant.            Do not shave 48 hours prior to surgery.  Men may shave face and neck.            Do not bring valuables to the hospital.            [facility] is not responsible for any belongings or valuables.  Do NOT Smoke (Tobacco/Vaping) or drink Alcohol 24 hours prior to your procedure If you use a CPAP at night, you may bring all equipment for your overnight stay.   Contacts, glasses, dentures or bridgework may not be worn into surgery, please bring cases for these belongings   For patients admitted to the hospital, discharge time will be determined by your treatment team.   Patients discharged the day of surgery will not be allowed to drive home, and someone needs to stay with them for 24 hours.    Special instructions:   [place]- Preparing For Surgery  Before surgery, you can play an important role. Because skin is not sterile, your skin needs to be as free of germs as possible. You can reduce the number of germs on your skin by washing with CHG (chlorahexidine gluconate) Soap before surgery.  CHG is an antiseptic cleaner which kills germs and bonds with the skin to continue killing germs even after washing.    Oral Hygiene is also important to reduce your risk of infection.  Remember - BRUSH YOUR TEETH THE MORNING OF SURGERY WITH YOUR REGULAR TOOTHPASTE  Please do not use if you have an allergy to CHG or antibacterial soaps. If your skin becomes reddened/irritated stop using the CHG.  Do not shave (including legs and underarms) for at least 48 hours prior to first CHG shower. It is OK to shave your  face.  Please follow these instructions carefully.   1. Shower the NIGHT BEFORE SURGERY and the MORNING OF SURGERY  2. If you chose to wash your hair, wash your hair first as usual with your normal shampoo.  3. After you shampoo, rinse your hair and body thoroughly to remove the shampoo.  4. Wash Face and genitals (private parts) with your normal soap.   5.  Shower the NIGHT BEFORE SURGERY and the MORNING OF SURGERY with CHG Soap.   6. Use CHG Soap as you would any  other liquid soap. You can apply CHG directly to the skin and wash gently with a scrungie or a clean washcloth.   7. Apply the CHG Soap to your body ONLY FROM THE NECK DOWN.  Do not use on open wounds or open sores. Avoid contact with your eyes, ears, mouth and genitals (private parts). Wash Face and genitals (private parts)  with your normal soap.   8. Wash thoroughly, paying special attention to the area where your surgery will be performed.  9. Thoroughly rinse your body with warm water from the neck down.  10. DO NOT shower/wash with your normal soap after using and rinsing off the CHG Soap.  11. Pat yourself dry with a CLEAN TOWEL.  12. Wear CLEAN PAJAMAS to bed the night before surgery  13. Place CLEAN SHEETS on your bed the night before your surgery  14. DO NOT SLEEP WITH PETS.   Day of Surgery: Take a shower with CHG soap. Wear Clean/Comfortable clothing the morning of surgery Do not apply any deodorants/lotions.   Remember to brush your teeth WITH YOUR REGULAR TOOTHPASTE.   Please read over the following fact sheets that you were given.

## 2020-05-17 NOTE — Progress Notes (Signed)
PERIOPERATIVE PRESCRIPTION FOR IMPLANTED CARDIAC DEVICE PROGRAMMING   Patient Information: Name: John Carrillo, John Carrillo  DOB: [birth year]  MRN: [id number]  Planned Procedure: left inguinal hernia repair with mesh  Surgeon: Abigail Miyamoto  Date of Procedure: 05/22/2020  Cautery will be used.  Position during surgery: supine   Please send documentation back to:  [facility] (Fax # [phone])   Rudene Anda, RN  05/17/2020 12:11 PM        Device Information:   Clinic EP Physician:   Hillis Range, MD Device Type:  Defibrillator Manufacturer and Phone #:  [employer]: [phone] Pacemaker Dependent?:  No Date of Last Device Check:  04/11/2020        Normal Device Function?:  Yes     Electrophysiologist's Recommendations:    Have magnet available.  Provide continuous ECG monitoring when magnet is used or reprogramming is to be performed.   Procedure should not interfere with device function.  No device programming or magnet placement needed.  Per Device Clinic Standing Orders, Uvaldo Rising  05/17/2020 12:34 PM

## 2020-05-18 ENCOUNTER — Other Ambulatory Visit: Payer: Self-pay

## 2020-05-18 ENCOUNTER — Encounter (HOSPITAL_COMMUNITY): Payer: Self-pay

## 2020-05-18 ENCOUNTER — Other Ambulatory Visit (HOSPITAL_COMMUNITY)
Admission: RE | Admit: 2020-05-18 | Discharge: 2020-05-18 | Disposition: A | Discharge status: Home / Self Care | Payer: Managed Care, Other (non HMO) | Source: Ambulatory Visit | Attending: Surgery | Admitting: Surgery

## 2020-05-18 ENCOUNTER — Encounter (HOSPITAL_COMMUNITY)
Admission: RE | Admit: 2020-05-18 | Discharge: 2020-05-18 | Disposition: A | Discharge status: Home / Self Care | Payer: Managed Care, Other (non HMO) | Source: Ambulatory Visit | Attending: Surgery | Admitting: Surgery

## 2020-05-18 ENCOUNTER — Other Ambulatory Visit (HOSPITAL_COMMUNITY): Payer: Managed Care, Other (non HMO)

## 2020-05-18 DIAGNOSIS — Z01812 Encounter for preprocedural laboratory examination: Secondary | ICD-10-CM | POA: Insufficient documentation

## 2020-05-18 DIAGNOSIS — Z20822 Contact with and (suspected) exposure to covid-19: Secondary | ICD-10-CM | POA: Diagnosis not present

## 2020-05-18 HISTORY — DX: Presence of cardiac pacemaker: Z95.0

## 2020-05-18 HISTORY — DX: Hypothyroidism, unspecified: E03.9

## 2020-05-18 HISTORY — DX: Heart failure, unspecified: I50.9

## 2020-05-18 HISTORY — DX: Personal history of urinary calculi: Z87.442

## 2020-05-18 HISTORY — DX: Presence of automatic (implantable) cardiac defibrillator: Z95.810

## 2020-05-18 HISTORY — DX: Acute myocardial infarction, unspecified: I21.9

## 2020-05-18 LAB — BASIC METABOLIC PANEL
Anion gap: 5 (ref 5–15)
BUN: 21 mg/dL (ref 8–23)
CO2: 27 mmol/L (ref 22–32)
Calcium: 8.9 mg/dL (ref 8.9–10.3)
Chloride: 105 mmol/L (ref 98–111)
Creatinine, Ser: 1.32 mg/dL — ABNORMAL HIGH (ref 0.61–1.24)
GFR, Estimated: 59 mL/min — ABNORMAL LOW (ref >60)
Glucose, Bld: 257 mg/dL — ABNORMAL HIGH (ref 70–99)
Potassium: 5 mmol/L (ref 3.5–5.1)
Sodium: 137 mmol/L (ref 135–145)

## 2020-05-18 LAB — CBC
HCT: 49.7 % (ref 39.0–52.0)
Hemoglobin: 16.4 g/dL (ref 13.0–17.0)
MCH: 33.3 pg (ref 26.0–34.0)
MCHC: 33 g/dL (ref 30.0–36.0)
MCV: 100.8 fL — ABNORMAL HIGH (ref 80.0–100.0)
Platelets: 195 10*3/uL (ref 150–400)
RBC: 4.93 MIL/uL (ref 4.22–5.81)
RDW: 12.2 % (ref 11.5–15.5)
WBC: 8.8 10*3/uL (ref 4.0–10.5)
nRBC: 0 % (ref 0.0–0.2)

## 2020-05-18 LAB — SARS CORONAVIRUS 2 (TAT 6-24 HRS): SARS Coronavirus 2: NEGATIVE

## 2020-05-18 LAB — GLUCOSE, CAPILLARY: Glucose-Capillary: 278 mg/dL — ABNORMAL HIGH (ref 70–99)

## 2020-05-18 NOTE — Progress Notes (Signed)
Surgical Instructions    Your procedure is scheduled on Monday, April 18th.  Report to [facility] Main Entrance "A" at 11:15 A.M., then check in with the Admitting office.  Call this number if you have problems the morning of surgery:  [phone]   If you have any questions prior to your surgery date call [phone]: Open Monday-Friday 8am-4pm    Remember:  Do not eat after midnight the night before your surgery  You may drink clear liquids until 10:15am the morning of your surgery.   Clear liquids allowed are: Water, Non-Citrus Juices (without pulp), Carbonated Beverages, Clear Tea, Black Coffee Only, and Gatorade. (Please choose sugar free or diet options)  Patient Instructions  . The day of surgery (if you have diabetes): o Drink ONE (1) 10 oz water bottle given to you in your pre admission testing appointment by 10:15am the morning of surgery. Drink in one sitting. Do not sip.  o This drink was given to you during your hospital  pre-op appointment visit.  o Nothing else to drink after completing the  10 oz bottle of water.          If you have questions, please contact your surgeon's office.     Take these medicines the morning of surgery with A SIP OF WATER  acetaminophen (TYLENOL) if needed for pain carvedilol (COREG) ezetimibe (ZETIA) levothyroxine (SYNTHROID)   Per your cardiologist, STOP clopidogrel (PLAVIX) 5 days pre-operatively. Last dose should be April 12th, 2022. It is also recommended that you take 81 mg of Aspirin during this time while your PLAVIX is on hold.   As of today, STOP taking any Aleve, Naproxen, Ibuprofen, Motrin, Advil, Goody's, BC's, all herbal medications, fish oil, and all vitamins.    WHAT DO I DO ABOUT MY DIABETES MEDICATION?   [name] Do not take oral diabetes medicines (pills) the morning of surgery.  . Do not take dapagliflozin propanediol Marcelline Deist) on Sunday, April 17th or Monday, April 18th.  . THE MORNING OF SURGERY, Take 24 units  Insulin Glargine.   . If your CBG is greater than 220 mg/dL, you may take  of your sliding scale (NOVOLOG FLEXPEN) dose of insulin.   HOW TO MANAGE YOUR DIABETES BEFORE AND AFTER SURGERY  Why is it important to control my blood sugar before and after surgery? . Improving blood sugar levels before and after surgery helps healing and can limit problems. . A way of improving blood sugar control is eating a healthy diet by: o  Eating less sugar and carbohydrates o  Increasing activity/exercise o  Talking with your doctor about reaching your blood sugar goals . High blood sugars (greater than 180 mg/dL) can raise your risk of infections and slow your recovery, so you will need to focus on controlling your diabetes during the weeks before surgery. . Make sure that the doctor who takes care of your diabetes knows about your planned surgery including the date and location.  How do I manage my blood sugar before surgery? . Check your blood sugar at least 4 times a day, starting 2 days before surgery, to make sure that the level is not too high or low. . Check your blood sugar the morning of your surgery when you wake up and every 2 hours until you get to the Short Stay unit. o If your blood sugar is less than 70 mg/dL, you will need to treat for low blood sugar: - Do not take insulin. - Treat a low blood  sugar (less than 70 mg/dL) with  cup of clear juice (cranberry or apple), 4 glucose tablets, OR glucose gel. - Recheck blood sugar in 15 minutes after treatment (to make sure it is greater than 70 mg/dL). If your blood sugar is not greater than 70 mg/dL on recheck, call [phone] for further instructions. . Report your blood sugar to the short stay nurse when you get to Short Stay.  . If you are admitted to the hospital after surgery: o Your blood sugar will be checked by the staff and you will probably be given insulin after surgery (instead of oral diabetes medicines) to make sure you have  good blood sugar levels. o The goal for blood sugar control after surgery is 80-180 mg/dL.  Preparing for surgery  Do NOT Smoke (Tobacco/Vaping) or drink Alcohol 24 hours prior to your procedure If you use a CPAP at night, you may bring all equipment for your overnight stay.   Contacts, glasses, hearing aids, piercing's, dentures or partials may not be worn into surgery, please bring cases for these belongings.   For patients admitted to the hospital, discharge time will be determined by your treatment team.   Patients discharged the day of surgery will not be allowed to drive home, and someone needs to stay with them for 24 hours.    Special instructions:   [place]- Preparing For Surgery  Before surgery, you can play an important role. Because skin is not sterile, your skin needs to be as free of germs as possible. You can reduce the number of germs on your skin by washing with CHG (chlorahexidine gluconate) Soap before surgery.  CHG is an antiseptic cleaner which kills germs and bonds with the skin to continue killing germs even after washing.    Oral Hygiene is also important to reduce your risk of infection.  Remember - BRUSH YOUR TEETH THE MORNING OF SURGERY WITH YOUR REGULAR TOOTHPASTE  Please do not use if you have an allergy to CHG or antibacterial soaps. If your skin becomes reddened/irritated stop using the CHG.  Do not shave (including legs and underarms) for at least 48 hours prior to first CHG shower. It is OK to shave your face.  Please follow these instructions carefully.   1. Shower the NIGHT BEFORE SURGERY and the MORNING OF SURGERY  2. If you chose to wash your hair, wash your hair first as usual with your normal shampoo.  3. After you shampoo, rinse your hair and body thoroughly to remove the shampoo.  4. Use CHG Soap as you would any other liquid soap. You can apply CHG directly to the skin and wash gently with a scrungie or a clean washcloth.   5. Apply  the CHG Soap to your body ONLY FROM THE NECK DOWN.  Do not use on open wounds or open sores. Avoid contact with your eyes, ears, mouth and genitals (private parts). Wash Face and genitals (private parts)  with your normal soap.   6. Wash thoroughly, paying special attention to the area where your surgery will be performed.  7. Thoroughly rinse your body with warm water from the neck down.  8. DO NOT shower/wash with your normal soap after using and rinsing off the CHG Soap.  9. Pat yourself dry with a CLEAN TOWEL.  10. Wear CLEAN PAJAMAS to bed the night before surgery  11. Place CLEAN SHEETS on your bed the night before your surgery  12. DO NOT SLEEP WITH PETS.  Day of Surgery:  Take a shower with CHG soap.               Do not wear jewelry, make up, or nail polish               Do not wear lotions, powders, colognes, or deodorant.               Men may shave face and neck.               Do not bring valuables to the hospital.               [facility] is not responsible for any belongings or valuables.   Wear Clean/Comfortable clothing the morning of surgery.   Remember to brush your teeth WITH YOUR REGULAR TOOTHPASTE.   Please read over the following fact sheets that you were given.

## 2020-05-18 NOTE — Progress Notes (Signed)
Anesthesia Chart Review:  Case: [id number] Date/Time: 05/22/20 1300   Procedure: LEFT INGUINAL HERNIA REPAIR WITH MESH (Left ) - 60/RM2   Anesthesia type: General   Pre-op diagnosis: LEFT INGUINAL HERNIA   Location: [facility] OR ROOM 02 / [facility] OR   Surgeons: Abigail Miyamoto, MD      DISCUSSION: Patient is a 70 year old male scheduled for the above procedure.   History includes former smoker (quit 05/03/00), CAD (anterior MI, s/p late lytic therapy and rescue PTCA/stent LAD at [facility] in 2003; NSTEMI, s/p DES RCA 01/16/15), ischemic cardiomyopathy, chronic systolic CHF, VT (s/p BiV AICD Medtronic 01/10/16), HTN, CVA (2008 left MCA; 01/24/15 right occipital lobe), HLD, CKD, DM1 (diagnosed 2008), hypothyroidism.    ICD Perioperative device RX: Device Information: Clinic EP Physician:James Allred, MD Device Type:Defibrillator Manufacturer and Phone #:Medtronic: [phone] Pacemaker Dependent?:No Date of Last Device Check:3/8/2022Normal Device Function?:Yes  Electrophysiologist's Recommendations:  Have magnet available.  Provide continuous ECG monitoring when magnet is used or reprogramming is to be performed.  Procedure should not interfere with device function.  No device programming or magnet placement needed.   Perioperative cardiac clearance and Plavix recommendations outlined by Tereso Newcomer, PA-C on 04/21/20,  "Surgical clearance   Mr. [name]'s perioperative risk of a major cardiac event is 11% according to the Revised Cardiac Risk Index (RCRI).  Therefore, he is at high risk for perioperative complications.   His functional capacity is good at 5.62 METs according to the Duke Activity Status Index (DASI). Recommendations:  According to ACC/AHA guidelines, no further cardiovascular testing needed.  The patient may proceed to surgery at acceptable risk...   Reviewed with Dr. [name] and Dr. Katrinka Blazing.Marland KitchenMarland KitchenIt is okay to hold clopidogrel (Plavix) for 5 days  prior to surgery. I recommend taking aspirin 81 mg daily while off of clopidogrel.Marland KitchenMarland KitchenIf the surgeon feels the bleeding risk is too great, he should just stop clopidogrel 5 days prior. Postoperatively, he can stop aspirin and resume clopidogrel when cleared by the surgeon." Patient reported, last Plavix 05/16/20, and started ASA 81 mg while Plavix on hold (started 05/17/20).   05/18/20 presurgical COVID-19 test is in process. Anesthesia team to evaluate on the day of surgery.    VS: BP 128/82   Pulse 69   Temp 36.8 C   Resp 18   Ht 5\' 9"  (1.753 m)   Wt 81.4 kg   SpO2 98%   BMI 26.49 kg/m    PROVIDERS: , MD is PCP  [name], MD is cardiologist. Last visit 04/21/20 with 04/23/20, PA-C. Tereso Newcomer, MD is EP. Last visit 04/07/20 with 06/07/20, PA-C. Gifford Shave, MD is endocrinologist. Last visit 04/07/20.   LABS: Labs reviewed: Acceptable for surgery. K 5.0. Cr 1.32, stable when compared to previously labs on 01/14/20. Non-fasting glucose 257, A1c 8.1% 04/07/20, down from 8.4%. (all labs ordered are listed, but only abnormal results are displayed)  Labs Reviewed  GLUCOSE, CAPILLARY - Abnormal; Notable for the following components:      Result Value   Glucose-Capillary 278 (*)    All other components within normal limits  BASIC METABOLIC PANEL - Abnormal; Notable for the following components:   Glucose, Bld 257 (*)    Creatinine, Ser 1.32 (*)    GFR, Estimated 59 (*)    All other components within normal limits  CBC - Abnormal; Notable for the following components:   MCV 100.8 (*)    All other components within normal limits  EKG: 04/21/20: NSR. PR 214 ms. V-paced.   CV: Echo 08/15/17: Study Conclusions  - Left ventricle: The cavity size was moderately dilated. There was  moderate focal basal and mild concentric hypertrophy. Systolic  function was moderately to severely reduced. The estimated  ejection fraction was in the range of  30% to 35%. Features are  consistent with a pseudonormal left ventricular filling pattern,  with concomitant abnormal relaxation and increased filling  pressure (grade 2 diastolic dysfunction).  - Regional wall motion abnormality: Akinesis of the mid-apical  anterior, mid anteroseptal, mid inferoseptal, mid-apical  inferior, apical septal, and apical myocardium; hypokinesis of  the basal anterior, basal anteroseptal, basal inferoseptal, basal  inferior, basal-mid inferolateral, basal-mid anterolateral, and  apical lateral myocardium.  - Aorta: Ascending aortic diameter: 40 mm (S).  - Ascending aorta: The ascending aorta was mildly dilated.  - Left atrium: The atrium was mildly dilated.  - Right ventricle: The cavity size was moderately dilated. Wall  thickness was normal.  - Atrial septum: No defect or patent foramen ovale was identified.  - Pericardium, extracardiac: A trivial pericardial effusion was  identified.   Impressions:  - Severe LV dysfunction with multiple wall motion abnormalities.  Study not significantly changed from prior.    Cardiac cath 01/16/15: Previously placed Prox LAD to Mid LAD stent (unknown type) is patent.   Mid RCA to Dist RCA lesion, 95% stenosed. Post intervention, there is a 0% residual stenosis.  There is severe left ventricular systolic dysfunction. The LV is enlarged. LVEF is < 25% by visual estimate. There are wall motion abnormalities in the left ventricle. Moderate to severe LV dilatation with severe LV dysfunction.   Past Medical History:  Diagnosis Date  . Acute kidney injury superimposed on chronic kidney disease (HCC) 01/25/2015  . AICD (automatic cardioverter/defibrillator) present   . CHF (congestive heart failure) (HCC)   . CORONARY ARTERY DISEASE 08/11/2006  . CVA 12/19/2006   2008  . DIABETES MELLITUS, TYPE I 08/11/2006  . ECZEMA, HANDS 12/15/2009  . History of kidney stones    10+ years ago  . HYPERLIPIDEMIA  08/11/2006  . HYPERTENSION 08/11/2006  . Hypothyroidism   . Myocardial infarction (HCC)   . Presence of permanent cardiac pacemaker   . Type 2 diabetes mellitus without complications (HCC) 12/20/2016    Past Surgical History:  Procedure Laterality Date  . CARDIAC CATHETERIZATION N/A 01/16/2015   Procedure: Left Heart Cath and Coronary Angiography;  Surgeon: [name], MD;  Location: [facility] INVASIVE CV LAB;  Service: Cardiovascular;  Laterality: N/A;  . CORONARY STENT PLACEMENT    . EP IMPLANTABLE DEVICE N/A 01/10/2016   MDT Claria MRI Quad CRTD implanted by Dr [name]  . INSERT / REPLACE / REMOVE PACEMAKER    . TEE WITHOUT CARDIOVERSION N/A 01/26/2015   Procedure: TRANSESOPHAGEAL ECHOCARDIOGRAM (TEE);  Surgeon: [name], MD;  Location: [facility] ENDOSCOPY;  Service: Cardiovascular;  Laterality: N/A;  . TONSILECTOMY, ADENOIDECTOMY, BILATERAL MYRINGOTOMY AND TUBES    . TONSILLECTOMY    . WISDOM TOOTH EXTRACTION    . WRIST FRACTURE SURGERY Right 1960   3rd grade    MEDICATIONS: . acetaminophen (TYLENOL) 325 MG tablet  . carvedilol (COREG) 6.25 MG tablet  . clopidogrel (PLAVIX) 75 MG tablet  . Coenzyme Q10 (COQ10) 200 MG CAPS  . dapagliflozin propanediol (FARXIGA) 5 MG TABS tablet  . ezetimibe (ZETIA) 10 MG tablet  . glucose blood (ONETOUCH VERIO) test strip  . insulin aspart (NOVOLOG FLEXPEN) 100 UNIT/ML FlexPen  .  Insulin Glargine-yfgn (SEMGLEE, YFGN,) 100 UNIT/ML SOPN  . levothyroxine (SYNTHROID) 100 MCG tablet  . Probiotic Product (PROBIOTIC PO)  . rosuvastatin (CRESTOR) 20 MG tablet  . sacubitril-valsartan (ENTRESTO) 49-51 MG  . tadalafil (CIALIS) 5 MG tablet   No current facility-administered medications for this encounter.    Shonna Chock, PA-C Surgical Short Stay/Anesthesiology [facility] Phone [phone] [facility] Phone [phone] 05/18/2020 12:05 PM

## 2020-05-18 NOTE — Anesthesia Preprocedure Evaluation (Addendum)
Anesthesia Evaluation  Patient identified by MRN, date of birth, ID band Patient awake    Reviewed: Allergy & Precautions, NPO status , Patient's Chart, lab work & pertinent test results, reviewed documented beta blocker date and time   Airway Mallampati: II  TM Distance: >3 FB Neck ROM: Full    Dental  (+) Dental Advisory Given   Pulmonary former smoker,    breath sounds clear to auscultation       Cardiovascular hypertension, Pt. on medications and Pt. on home beta blockers + CAD, + Past MI, + Cardiac Stents, + Peripheral Vascular Disease and +CHF  + dysrhythmias + pacemaker + Cardiac Defibrillator  Rhythm:Regular Rate:Normal     Neuro/Psych CVA    GI/Hepatic negative GI ROS, Neg liver ROS,   Endo/Other  diabetes, Type 1, Insulin DependentHypothyroidism   Renal/GU CRFRenal disease     Musculoskeletal   Abdominal   Peds  Hematology negative hematology ROS (+)   Anesthesia Other Findings   Reproductive/Obstetrics                            Anesthesia Physical Anesthesia Plan  ASA: III  Anesthesia Plan: General   Post-op Pain Management:  Regional for Post-op pain   Induction: Intravenous  PONV Risk Score and Plan: 2 and Dexamethasone, Ondansetron and Treatment may vary due to age or medical condition  Airway Management Planned: LMA  Additional Equipment: None  Intra-op Plan:   Post-operative Plan: Extubation in OR  Informed Consent: I have reviewed the patients History and Physical, chart, labs and discussed the procedure including the risks, benefits and alternatives for the proposed anesthesia with the patient or authorized representative who has indicated his/her understanding and acceptance.     Dental advisory given  Plan Discussed with: CRNA  Anesthesia Plan Comments: ( )       Anesthesia Quick Evaluation

## 2020-05-18 NOTE — Progress Notes (Signed)
PCP - Dr. Tana Conch Cardiologist - Dr. Burna Forts EP: Dr. [name] Endocrinologist: Dr. Nelwyn Salisbury  PPM/ICD - Medtronic Device Orders - Received/in Epic and shadow chart Rep Notified - Kathlene November with [employer] emailed on 05/17/20 by Kandice Hams, RN  Chest x-ray - n/a EKG - 04/21/20-epic Stress Test - 15+ years ago ECHO - 08/17/17 Cardiac Cath - 01/16/15  Sleep Study - denies CPAP - denies  Fasting Blood Sugar - 150-180 Checks Blood Sugar __2___ times a day CBG at PAT: 278; pt had a late night snack at 2200. Basal insulin reported taken before PAT appointment. Pt takes sliding scale insulin after breakfast.  Last A1C: 04/07/20: 8.1  Blood Thinner Instructions: Hold Plavix 5 days pre-op. LD 05/16/20 Aspirin Instructions: Start taking 81mg  while plavix on hold. First dose was 05/17/20.  ERAS Protcol - Protocol initiated. Pt to stop clears by 1015 DOS. PRE-SURGERY Ensure or water-Pt is Type 1 diabetic, provided pt with (1) 10oz bottle of water.  COVID TEST- 05/18/20; pending. Pt is aware to quarantine after testing until surgery.    Anesthesia review: Yes, heart history, PPM/ICD implated, cardiac clearance received, A1C greater than 8.  Patient denies shortness of breath, fever, cough and chest pain at PAT appointment   All instructions explained to the patient, with a verbal understanding of the material. Patient agrees to go over the instructions while at home for a better understanding. Patient also instructed to self quarantine after being tested for COVID-19. The opportunity to ask questions was provided.

## 2020-05-21 NOTE — H&P (Signed)
John Carrillo Appointment: 04/21/2020 11:30 AM Location: [facility] Patient #: [id number] DOB: [birth year] Married / Language: [place] / Race: White Male   History of Present Illness Riley Lam A. Magnus Ivan MD; 04/21/2020 11:52 AM) The patient is a 70 year old male who presents with an inguinal hernia.  Chief complaint: Left inguinal hernia  This is a pleasant 70 year old gentleman with a known left inguinal hernia. He reports having had appropriate year. It is slightly getting larger and causes minimal discomfort. He has had no obstructive symptoms. He does have a history of coronary artery disease and CVA. He is currently on Plavix. He has already been seen by cardiology regarding surgery.   Past Surgical History Marliss Coots, CNA; 04/21/2020 11:24 AM) Tonsillectomy   Diagnostic Studies History Marliss Coots, CNA; 04/21/2020 11:24 AM) Colonoscopy  1-5 years ago  Allergies Marliss Coots, CNA; 04/21/2020 11:25 AM) No Known Drug Allergies  [04/21/2020]: Allergies Reconciled   Medication History Marliss Coots, CNA; 04/21/2020 11:25 AM) Carvedilol (6.25MG  Tablet, Oral) Active. Clopidogrel Bisulfate (75MG  Tablet, Oral) Active. Entresto (49-51MG  Tablet, Oral) Active. Ezetimibe (10MG  Tablet, Oral) Active. Farxiga (5MG  Tablet, Oral) Active. FreeStyle Libre 14 Day Reader Active. FreeStyle Libre 14 Day Sensor Active. Lantus SoloStar (100UNIT/ML Soln Pen-inj, Subcutaneous) Active. Levothyroxine Sodium ( Tablet, Oral) Active. Levothyroxine Sodium ( Tablet, Oral) Active. Levothyroxine Sodium ( Tablet, Oral) Active. NovoLOG FlexPen (100UNIT/ML Soln Pen-inj, Subcutaneous) Active. Rosuvastatin Calcium (40MG  Cap Sprinkle, Oral) Active. Rosuvastatin Calcium (40MG  Cap Sprinkle, Oral) Active. Rosuvastatin Calcium (40MG  Cap Sprinkle, Oral) Active. Rosuvastatin Calcium (20MG  Tablet, Oral) Active. Semglee (yfgn) (100UNIT/ML Soln  Pen-inj, Subcutaneous) Active. Simvastatin (40MG  Tablet, Oral) Active. Tadalafil (10MG  Tablet, Oral) Active. Tadalafil (10MG  Tablet, Oral) Active. Tadalafil (5MG  Tablet, Oral) Active. 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 Caffeine use  Coffee, Tea. No drug use  Tobacco use  Former smoker.  Family History , CNA; 04/21/2020 11:24 AM) Arthritis  Family Members In General. Heart Disease  Family Members In General, Father. Hypertension  Father. Respiratory Condition  Father. Thyroid problems  Mother.  Other Problems , CNA; 04/21/2020 11:24 AM) Cerebrovascular Accident  Congestive Heart Failure  Diabetes Mellitus  Enlarged Prostate  High blood pressure  Hypercholesterolemia  Inguinal Hernia  Myocardial infarction  Other disease, cancer, significant illness  Thyroid Disease     Review of Systems [place] CNA; 04/21/2020 11:24 AM) General Not Present- Appetite Loss, Chills, Fatigue, Fever, Night Sweats, Weight Gain and Weight Loss. Skin Not Present- Change in Wart/Mole, Dryness, Hives, Jaundice, New Lesions, Non-Healing Wounds, Rash and Ulcer. HEENT Present- Seasonal Allergies and Wears glasses/contact lenses. Not Present- Earache, Hearing Loss, Hoarseness, Nose Bleed, Oral Ulcers, Ringing in the Ears, Sinus Pain, Sore Throat, Visual Disturbances and Yellow Eyes. Respiratory Present- Snoring. Not Present- Bloody sputum, Chronic Cough, Difficulty Breathing and Wheezing. Breast Not Present- Breast Mass, Breast Pain, Nipple Discharge and Skin Changes. Cardiovascular Not Present- Chest Pain, Difficulty Breathing Lying Down, Leg Cramps, Palpitations, Rapid Heart Rate, Shortness of Breath and Swelling of Extremities. Gastrointestinal Not Present- Abdominal Pain, Bloating, Bloody Stool, Change in Bowel Habits, Chronic diarrhea, Constipation,  Difficulty Swallowing, Excessive gas, Gets full quickly at meals, Hemorrhoids, Indigestion, Nausea, Rectal Pain and Vomiting. Male Genitourinary Present- Change in Urinary Stream. Not Present- Blood in Urine, Frequency, Impotence, Nocturia, Painful Urination, Urgency and Urine Leakage.  Vitals [place] CNA; 04/21/2020 11:25 AM) 04/21/2020 11:25 AM Weight: 181 lb Height: 69in Body Surface Area: 1.98 m Body Mass  Index: 26.73 kg/m  Temp.: 6F  Pulse: 65 (Regular)  P.OX: 98% (Room air) BP: 130/70(Sitting, Left Arm, Standard)       Physical Exam (Blayke Pinera A. Magnus Ivan MD; 04/21/2020 11:53 AM) The physical exam findings are as follows: Note: He appears well physical examination.  His abdomen is soft and nontender. He has a moderate sized, easily reducible left inguinal hernia.    Assessment & Plan (Decklan Mau A. Magnus Ivan MD; 04/21/2020 11:57 AM) LEFT INGUINAL HERNIA (K40.90) Impression: I reviewed his notes in the electronic medical records including his physical today with cardiology. He does have a left inguinal hernia. I discussed the diagnosis with him in detail. We discussed abdominal anatomy. We discussed the reasons to repair hernias. I would recommend repair of his hernia given the size and his discomfort. I discussed hernia repair with mesh. Given his cardiac history, I would do this with an open repair with mesh with lighter anesthesia and a tap block. I discussed the procedure with him in detail. We discussed the risk of surgery which includes but is not limited to bleeding, infection, injury to surrounding structures, the use of mesh, cardiopulmonary issues, hernia recurrence, etc.  Cardiology does put his perioperative risk of a major cardiac event at 11% so I would want to perform his procedure at the hospital and not an outpatient setting. Partially, he is very active.  Again, he wished to proceed with surgery

## 2020-05-22 ENCOUNTER — Ambulatory Visit (HOSPITAL_COMMUNITY)
Admission: RE | Admit: 2020-05-22 | Discharge: 2020-05-22 | Disposition: A | Discharge status: Home / Self Care | Payer: Managed Care, Other (non HMO) | Attending: Surgery | Admitting: Surgery

## 2020-05-22 ENCOUNTER — Encounter (HOSPITAL_COMMUNITY): Payer: Self-pay | Admitting: Surgery

## 2020-05-22 ENCOUNTER — Other Ambulatory Visit: Payer: Self-pay

## 2020-05-22 ENCOUNTER — Encounter (HOSPITAL_COMMUNITY)
Admission: RE | Disposition: A | Discharge status: Home / Self Care | Payer: Self-pay | Source: Home / Self Care | Attending: Surgery

## 2020-05-22 ENCOUNTER — Ambulatory Visit (HOSPITAL_COMMUNITY): Payer: Managed Care, Other (non HMO) | Admitting: Vascular Surgery

## 2020-05-22 ENCOUNTER — Ambulatory Visit (HOSPITAL_COMMUNITY): Payer: Managed Care, Other (non HMO) | Admitting: Anesthesiology

## 2020-05-22 DIAGNOSIS — Z794 Long term (current) use of insulin: Secondary | ICD-10-CM | POA: Insufficient documentation

## 2020-05-22 DIAGNOSIS — I2510 Atherosclerotic heart disease of native coronary artery without angina pectoris: Secondary | ICD-10-CM | POA: Diagnosis not present

## 2020-05-22 DIAGNOSIS — Z87891 Personal history of nicotine dependence: Secondary | ICD-10-CM | POA: Diagnosis not present

## 2020-05-22 DIAGNOSIS — K4090 Unilateral inguinal hernia, without obstruction or gangrene, not specified as recurrent: Secondary | ICD-10-CM | POA: Diagnosis not present

## 2020-05-22 DIAGNOSIS — Z8673 Personal history of transient ischemic attack (TIA), and cerebral infarction without residual deficits: Secondary | ICD-10-CM | POA: Insufficient documentation

## 2020-05-22 DIAGNOSIS — Z79890 Hormone replacement therapy (postmenopausal): Secondary | ICD-10-CM | POA: Diagnosis not present

## 2020-05-22 DIAGNOSIS — Z79899 Other long term (current) drug therapy: Secondary | ICD-10-CM | POA: Diagnosis not present

## 2020-05-22 DIAGNOSIS — I252 Old myocardial infarction: Secondary | ICD-10-CM | POA: Diagnosis not present

## 2020-05-22 DIAGNOSIS — Z7902 Long term (current) use of antithrombotics/antiplatelets: Secondary | ICD-10-CM | POA: Diagnosis not present

## 2020-05-22 HISTORY — PX: INGUINAL HERNIA REPAIR: SHX194

## 2020-05-22 HISTORY — PX: INSERTION OF MESH: SHX5868

## 2020-05-22 LAB — GLUCOSE, CAPILLARY
Glucose-Capillary: 120 mg/dL — ABNORMAL HIGH (ref 70–99)
Glucose-Capillary: 175 mg/dL — ABNORMAL HIGH (ref 70–99)
Glucose-Capillary: 53 mg/dL — ABNORMAL LOW (ref 70–99)
Glucose-Capillary: 134 mg/dL — ABNORMAL HIGH (ref 70–99)

## 2020-05-22 SURGERY — REPAIR, HERNIA, INGUINAL, ADULT
Anesthesia: General | Site: Groin | Laterality: Left

## 2020-05-22 MED ORDER — MIDAZOLAM HCL 2 MG/2ML IJ SOLN
INTRAMUSCULAR | Status: AC
  Filled 2020-05-22: qty 2

## 2020-05-22 MED ORDER — PHENYLEPHRINE HCL-NACL 10-0.9 MG/250ML-% IV SOLN
INTRAVENOUS | Status: DC | PRN
  Administered 2020-05-22: 50 ug/min via INTRAVENOUS

## 2020-05-22 MED ORDER — CLONIDINE HCL (ANALGESIA) 100 MCG/ML EP SOLN
EPIDURAL | Status: DC | PRN
  Administered 2020-05-22: 50 ug

## 2020-05-22 MED ORDER — DEXTROSE 50 % IV SOLN
INTRAVENOUS | Status: AC
  Filled 2020-05-22: qty 50

## 2020-05-22 MED ORDER — CEFAZOLIN SODIUM-DEXTROSE 2-4 GM/100ML-% IV SOLN
INTRAVENOUS | Status: AC
  Filled 2020-05-22: qty 100

## 2020-05-22 MED ORDER — FENTANYL CITRATE (PF) 250 MCG/5ML IJ SOLN
INTRAMUSCULAR | Status: AC
  Filled 2020-05-22: qty 5

## 2020-05-22 MED ORDER — BUPIVACAINE-EPINEPHRINE (PF) 0.25% -1:200000 IJ SOLN
INTRAMUSCULAR | Status: DC | PRN
  Administered 2020-05-22: 10 mL

## 2020-05-22 MED ORDER — FENTANYL CITRATE (PF) 100 MCG/2ML IJ SOLN
INTRAMUSCULAR | Status: DC | PRN
  Administered 2020-05-22 (×2): 50 ug via INTRAVENOUS

## 2020-05-22 MED ORDER — AMISULPRIDE (ANTIEMETIC) 5 MG/2ML IV SOLN: 10.0000 mg | Freq: Once | INTRAVENOUS | Status: DC | PRN

## 2020-05-22 MED ORDER — PROPOFOL 10 MG/ML IV BOLUS
INTRAVENOUS | Status: DC | PRN
  Administered 2020-05-22: 170 mg via INTRAVENOUS

## 2020-05-22 MED ORDER — LIDOCAINE 2% (20 MG/ML) 5 ML SYRINGE
INTRAMUSCULAR | Status: DC | PRN
  Administered 2020-05-22: 20 mg via INTRAVENOUS

## 2020-05-22 MED ORDER — ENSURE PRE-SURGERY PO LIQD
296.0000 mL | Freq: Once | ORAL | Status: DC
Start: 2020-05-23 — End: 2020-05-22

## 2020-05-22 MED ORDER — MIDAZOLAM HCL 2 MG/2ML IJ SOLN: 2.0000 mg | Freq: Once | INTRAMUSCULAR | Status: AC

## 2020-05-22 MED ORDER — ONDANSETRON HCL 4 MG/2ML IJ SOLN
INTRAMUSCULAR | Status: DC | PRN
  Administered 2020-05-22: 4 mg via INTRAVENOUS

## 2020-05-22 MED ORDER — 0.9 % SODIUM CHLORIDE (POUR BTL) OPTIME
TOPICAL | Status: DC | PRN
  Administered 2020-05-22: 1000 mL

## 2020-05-22 MED ORDER — BUPIVACAINE-EPINEPHRINE (PF) 0.5% -1:200000 IJ SOLN
INTRAMUSCULAR | Status: DC | PRN
  Administered 2020-05-22: 30 mL

## 2020-05-22 MED ORDER — CHLORHEXIDINE GLUCONATE 0.12 % MT SOLN: 15.0000 mL | Freq: Once | OROMUCOSAL | Status: AC

## 2020-05-22 MED ORDER — MIDAZOLAM HCL 2 MG/2ML IJ SOLN
INTRAMUSCULAR | Status: AC
  Administered 2020-05-22: 2 mg via INTRAVENOUS
  Filled 2020-05-22: qty 2

## 2020-05-22 MED ORDER — ORAL CARE MOUTH RINSE: 15.0000 mL | Freq: Once | OROMUCOSAL | Status: AC

## 2020-05-22 MED ORDER — FENTANYL CITRATE (PF) 100 MCG/2ML IJ SOLN: 50.0000 ug | Freq: Once | INTRAMUSCULAR | Status: AC

## 2020-05-22 MED ORDER — BUPIVACAINE-EPINEPHRINE (PF) 0.25% -1:200000 IJ SOLN
INTRAMUSCULAR | Status: AC
  Filled 2020-05-22: qty 30

## 2020-05-22 MED ORDER — FENTANYL CITRATE (PF) 100 MCG/2ML IJ SOLN
INTRAMUSCULAR | Status: AC
  Administered 2020-05-22: 50 ug via INTRAVENOUS
  Filled 2020-05-22: qty 2

## 2020-05-22 MED ORDER — ACETAMINOPHEN 500 MG PO TABS: 1000.0000 mg | ORAL_TABLET | ORAL | Status: AC

## 2020-05-22 MED ORDER — DEXTROSE 50 % IV SOLN
25.0000 g | INTRAVENOUS | Status: AC
  Administered 2020-05-22: 25 g via INTRAVENOUS
  Filled 2020-05-22: qty 50

## 2020-05-22 MED ORDER — FENTANYL CITRATE (PF) 100 MCG/2ML IJ SOLN: 25.0000 ug | INTRAMUSCULAR | Status: DC | PRN

## 2020-05-22 MED ORDER — LACTATED RINGERS IV SOLN: INTRAVENOUS | Status: DC

## 2020-05-22 MED ORDER — CHLORHEXIDINE GLUCONATE CLOTH 2 % EX PADS: 6.0000 | MEDICATED_PAD | Freq: Once | CUTANEOUS | Status: DC

## 2020-05-22 MED ORDER — ACETAMINOPHEN 500 MG PO TABS
ORAL_TABLET | ORAL | Status: AC
  Administered 2020-05-22: 1000 mg via ORAL
  Filled 2020-05-22: qty 2

## 2020-05-22 MED ORDER — OXYCODONE HCL 5 MG PO TABS: 5.0000 mg | ORAL_TABLET | Freq: Four times a day (QID) | ORAL | 0 refills | Status: DC | PRN

## 2020-05-22 MED ORDER — CEFAZOLIN SODIUM-DEXTROSE 2-4 GM/100ML-% IV SOLN
2.0000 g | INTRAVENOUS | Status: AC
  Administered 2020-05-22: 2 g via INTRAVENOUS

## 2020-05-22 MED ORDER — PHENYLEPHRINE 40 MCG/ML (10ML) SYRINGE FOR IV PUSH (FOR BLOOD PRESSURE SUPPORT): PREFILLED_SYRINGE | INTRAVENOUS | Status: DC | PRN

## 2020-05-22 MED ORDER — CHLORHEXIDINE GLUCONATE 0.12 % MT SOLN
OROMUCOSAL | Status: AC
  Administered 2020-05-22: 15 mL via OROMUCOSAL
  Filled 2020-05-22: qty 15

## 2020-05-22 MED ORDER — PROPOFOL 10 MG/ML IV BOLUS
INTRAVENOUS | Status: AC
  Filled 2020-05-22: qty 20

## 2020-05-22 SURGICAL SUPPLY — 39 items
ADH SKN CLS APL DERMABOND .7 (GAUZE/BANDAGES/DRESSINGS) ×1
APL PRP STRL LF DISP 70% ISPRP (MISCELLANEOUS) ×1
APPLIER CLIP 9.375 MED OPEN (MISCELLANEOUS) ×2
APR CLP MED 9.3 20 MLT OPN (MISCELLANEOUS) ×1
BLADE CLIPPER SURG (BLADE) IMPLANT
CHLORAPREP W/TINT 26 (MISCELLANEOUS) ×2 IMPLANT
CLIP APPLIE 9.375 MED OPEN (MISCELLANEOUS) IMPLANT
COVER SURGICAL LIGHT HANDLE (MISCELLANEOUS) ×2 IMPLANT
COVER WAND RF STERILE (DRAPES) IMPLANT
DERMABOND ADVANCED (GAUZE/BANDAGES/DRESSINGS) ×1
DERMABOND ADVANCED .7 DNX12 (GAUZE/BANDAGES/DRESSINGS) ×1 IMPLANT
DRAIN PENROSE 1/2X12 LTX STRL (WOUND CARE) IMPLANT
DRAPE LAPAROTOMY TRNSV 102X78 (DRAPES) ×2 IMPLANT
ELECT REM PT RETURN 9FT ADLT (ELECTROSURGICAL) ×2
ELECTRODE REM PT RTRN 9FT ADLT (ELECTROSURGICAL) ×1 IMPLANT
GLOVE SURG SIGNA 7.5 PF LTX (GLOVE) ×2 IMPLANT
GOWN STRL REUS W/ TWL LRG LVL3 (GOWN DISPOSABLE) ×1 IMPLANT
GOWN STRL REUS W/ TWL XL LVL3 (GOWN DISPOSABLE) ×1 IMPLANT
GOWN STRL REUS W/TWL LRG LVL3 (GOWN DISPOSABLE) ×2
GOWN STRL REUS W/TWL XL LVL3 (GOWN DISPOSABLE) ×2
KIT BASIN OR (CUSTOM PROCEDURE TRAY) ×2 IMPLANT
KIT TURNOVER KIT B (KITS) ×2 IMPLANT
MESH PARIETEX PROGRIP LEFT (Mesh General) ×1 IMPLANT
NDL HYPO 25GX1X1/2 BEV (NEEDLE) ×1 IMPLANT
NEEDLE HYPO 25GX1X1/2 BEV (NEEDLE) ×2 IMPLANT
NS IRRIG 1000ML POUR BTL (IV SOLUTION) ×2 IMPLANT
PACK GENERAL/GYN (CUSTOM PROCEDURE TRAY) ×2 IMPLANT
PAD ARMBOARD 7.5X6 YLW CONV (MISCELLANEOUS) ×2 IMPLANT
PENCIL SMOKE EVACUATOR (MISCELLANEOUS) ×2 IMPLANT
SUT MON AB 4-0 PC3 18 (SUTURE) ×2 IMPLANT
SUT SILK 2 0 SH (SUTURE) IMPLANT
SUT VIC AB 2-0 CT1 27 (SUTURE) ×2
SUT VIC AB 2-0 CT1 36 (SUTURE) ×1 IMPLANT
SUT VIC AB 2-0 CT1 TAPERPNT 27 (SUTURE) ×1 IMPLANT
SUT VIC AB 3-0 CT1 27 (SUTURE) ×2
SUT VIC AB 3-0 CT1 TAPERPNT 27 (SUTURE) ×1 IMPLANT
SYR CONTROL 10ML LL (SYRINGE) ×2 IMPLANT
TOWEL GREEN STERILE (TOWEL DISPOSABLE) ×2 IMPLANT
TOWEL GREEN STERILE FF (TOWEL DISPOSABLE) ×2 IMPLANT

## 2020-05-22 NOTE — Anesthesia Procedure Notes (Signed)
Anesthesia Regional Block: TAP block   Pre-Anesthetic Checklist: ,, timeout performed, Correct Patient, Correct Site, Correct Laterality, Correct Procedure, Correct Position, site marked, Risks and benefits discussed,  Surgical consent,  Pre-op evaluation,  At surgeon's request and post-op pain management  Laterality: Left  Prep: chloraprep       Needles:  Injection technique: Single-shot  Needle Type: Echogenic Needle     Needle Length: 9cm  Needle Gauge: 21     Additional Needles:   Procedures:,,,, ultrasound used (permanent image in chart),,,,  Narrative:  Start time: 05/22/2020 12:06 PM End time: 05/22/2020 12:11 PM Injection made incrementally with aspirations every 5 mL.  Performed by: Personally  Anesthesiologist: Marcene Duos, MD

## 2020-05-22 NOTE — Op Note (Signed)
LEFT INGUINAL HERNIA REPAIR WITH MESH, INSERTION OF MESH  Procedure Note  John Carrillo 05/22/2020   Pre-op Diagnosis: LEFT INGUINAL HERNIA     Post-op Diagnosis: same  Procedure(s): LEFT INGUINAL HERNIA REPAIR WITH MESH INSERTION OF MESH  Surgeon(s): Abigail Miyamoto, MD  Anesthesia: General  Staff:  Circulator: Pietro Cassis, RN Scrub Person: Coralee North T  Estimated Blood Loss: Minimal               Findings: The patient was found to have a direct left inguinal aneurysm which was repaired with a piece of Bard Prolene ProGrip mesh from [employer]  Procedure: The patient received a TAP block from anesthesiology in the preoperative holding area.  He was taken to the operating room.  He was placed on the operating table general esthesia was induced.  His abdomen was then prepped and draped in sterile fashion.  Anesthetized skin left inguinal area with Marcaine longitudinal incision with scalpel.  I dissected down through Scarpa's fascia with cautery.  The external oblique fascia was then opened with internal and external ring.  Patient had a moderate sized direct inguinal hernia without evidence of indirect inguinal hernia.  I control the testicular cord structures with a Penrose drain.  I then reduced the hernia sac and reinforced the inguinal floor implicating it with interrupted 2-0 Vicryl sutures.  Next a piece of ProGrip Prolene mesh was brought to the field.  His pubic tubercle and then brought it around the cord structures.  I sutured the mesh to the pubic tubercle and shelving edge of the inguinal ligament..  Wide coverage of the inguinal floor appeared to be achieved.  A small bridging vessel was clipped with surgical clips.  Hemostasis appeared to be achieved.  I closed the external oblique fascia over the top of the mesh with a running 2-0 Vicryl suture.  Scarpa's fascia was closed with interrupted 3-0 Vicryl sutures and skin was closed with a running 4-0 Monocryl.   Dermabond was then applied.  Patient tolerated procedure well.  All counts were correct at the end of the procedure.  Patient was then extubated in the operating taken stable condition to the recovery room.          Abigail Miyamoto   Date: 05/22/2020  Time: 2:32 PM

## 2020-05-22 NOTE — Discharge Instructions (Signed)
[facility] _______[facility]  UMBILICAL OR INGUINAL HERNIA REPAIR: POST OP INSTRUCTIONS  Always review your discharge instruction sheet given to you by the facility where your surgery was performed. IF YOU HAVE DISABILITY OR FAMILY LEAVE FORMS, YOU MUST BRING THEM TO THE OFFICE FOR PROCESSING.   DO NOT GIVE THEM TO YOUR DOCTOR.  1. A  prescription for pain medication may be given to you upon discharge.  Take your pain medication as prescribed, if needed.  If narcotic pain medicine is not needed, then you may take acetaminophen (Tylenol) or ibuprofen (Advil) as needed. 2. Take your usually prescribed medications unless otherwise directed. If you need a refill on your pain medication, please contact your pharmacy.  They will contact our office to request authorization. Prescriptions will not be filled after 5 pm or on week-ends. 3. You should follow a light diet the first 24 hours after arrival home, such as soup and crackers, etc.  Be sure to include lots of fluids daily.  Resume your normal diet the day after surgery. 4.Most patients will experience some swelling and bruising around the umbilicus or in the groin and scrotum.  Ice packs and reclining will help.  Swelling and bruising can take several days to resolve.  6. It is common to experience some constipation if taking pain medication after surgery.  Increasing fluid intake and taking a stool softener (such as Colace) will usually help or prevent this problem from occurring.  A mild laxative (Milk of Magnesia or Miralax) should be taken according to package directions if there are no bowel movements after 48 hours. 7. Unless discharge instructions indicate otherwise, you may remove your bandages 24-48 hours after surgery, and you may shower at that time.  You may have steri-strips (small skin tapes) in place directly over the incision.  These strips should be left on the skin for 7-10 days.  If your surgeon used skin glue on the  incision, you may shower in 24 hours.  The glue will flake off over the next 2-3 weeks.  Any sutures or staples will be removed at the office during your follow-up visit. 8. ACTIVITIES:  You may resume regular (light) daily activities beginning the next day--such as daily self-care, walking, climbing stairs--gradually increasing activities as tolerated.  You may have sexual intercourse when it is comfortable.  Refrain from any heavy lifting or straining until approved by your doctor.  a.You may drive when you are no longer taking prescription pain medication, you can comfortably wear a seatbelt, and you can safely maneuver your car and apply brakes. b.RETURN TO WORK:   _____________________________________________  9.You should see your doctor in the office for a follow-up appointment approximately 2-3 weeks after your surgery.  Make sure that you call for this appointment within a day or two after you arrive home to insure a convenient appointment time. 10.OTHER INSTRUCTIONS: _OK TO SHOWER STARTING TOMORROW ICE PACK, TYLENOL ALSO FOR PAIN NO LIFTING MORE THAN 15 POUNDS FOR 4 WEEKS________________________    _____________________________________  WHEN TO CALL YOUR DOCTOR: 1. Fever over 101.0 2. Inability to urinate 3. Nausea and/or vomiting 4. Extreme swelling or bruising 5. Continued bleeding from incision. 6. Increased pain, redness, or drainage from the incision  The clinic staff is available to answer your questions during regular business hours.  Please don't hesitate to call and ask to speak to one of the nurses for clinical concerns.  If you have a medical emergency, go to the nearest emergency room   or call 911.  A surgeon from [facility] is always on call at the hospital   [address], [address], [place], [place]  [address] ?  [address] [address], [place], [place]   [address] [phone] ? [phone] ? FAX [phone] Web site: [email or web address] 

## 2020-05-22 NOTE — Progress Notes (Signed)
Hypoglycemic Event  CBG: 53  Treatment: Dextrose 50% - 25 mg IV  Symptoms: None  Follow-up CBG: Time:1355 CBG Result: per MD CBG will be checked in OR  Possible Reasons for Event:   Comments/MD notified:Dr. Grace Bushy, John Carrillo

## 2020-05-22 NOTE — Anesthesia Procedure Notes (Signed)
Procedure Name: LMA Insertion Date/Time: 05/22/2020 1:59 PM Performed by: Rosiland Oz, CRNA Pre-anesthesia Checklist: Patient identified, Emergency Drugs available, Suction available, Patient being monitored and Timeout performed Patient Re-evaluated:Patient Re-evaluated prior to induction Oxygen Delivery Method: Circle system utilized Preoxygenation: Pre-oxygenation with 100% oxygen Induction Type: IV induction LMA: LMA inserted LMA Size: 4.0 Dental Injury: Teeth and Oropharynx as per pre-operative assessment  Comments: Insertion by Joaquin Courts SRNA

## 2020-05-22 NOTE — Anesthesia Postprocedure Evaluation (Signed)
Anesthesia Post Note  Patient: AADI BORDNER  Procedure(s) Performed: LEFT INGUINAL HERNIA REPAIR WITH MESH (Left Groin) INSERTION OF MESH (Left Groin)     Patient location during evaluation: PACU Anesthesia Type: General Level of consciousness: sedated Pain management: pain level controlled Vital Signs Assessment: post-procedure vital signs reviewed and stable Respiratory status: spontaneous breathing and respiratory function stable Cardiovascular status: stable Postop Assessment: no apparent nausea or vomiting Anesthetic complications: no   No complications documented.  Last Vitals:  Vitals:   05/22/20 1500 05/22/20 1515  BP: 125/78 119/67  Pulse: 60 60  Resp: 12 (!) 9  Temp:    SpO2: 97% 98%    Last Pain:  Vitals:   05/22/20 1515  TempSrc:   PainSc: 0-No pain                 Genice Kimberlin DANIEL

## 2020-05-22 NOTE — Transfer of Care (Signed)
Immediate Anesthesia Transfer of Care Note  Patient: John Carrillo  Procedure(s) Performed: LEFT INGUINAL HERNIA REPAIR WITH MESH (Left Groin) INSERTION OF MESH (Left Groin)  Patient Location: PACU  Anesthesia Type:GA combined with regional for post-op pain  Level of Consciousness: drowsy and patient cooperative  Airway & Oxygen Therapy: Patient Spontanous Breathing  Post-op Assessment: Report given to RN and Post -op Vital signs reviewed and stable  Post vital signs: Reviewed and stable  Last Vitals:  Vitals Value Taken Time  BP 128/76 05/22/20 1445  Temp    Pulse 59 05/22/20 1446  Resp 10 05/22/20 1446  SpO2 100 % 05/22/20 1446  Vitals shown include unvalidated device data.  Last Pain:  Vitals:   05/22/20 1122  TempSrc:   PainSc: 0-No pain         Complications: No complications documented.

## 2020-05-22 NOTE — Interval H&P Note (Signed)
History and Physical Interval Note: no change in H and P  05/22/2020 11:18 AM  John Carrillo  has presented today for surgery, with the diagnosis of LEFT INGUINAL HERNIA.  The various methods of treatment have been discussed with the patient and family. After consideration of risks, benefits and other options for treatment, the patient has consented to  Procedure(s) with comments: LEFT INGUINAL HERNIA REPAIR WITH MESH (Left) - 60/RM2 as a surgical intervention.  The patient's history has been reviewed, patient examined, no change in status, stable for surgery.  I have reviewed the patient's chart and labs.  Questions were answered to the patient's satisfaction.     Abigail Miyamoto

## 2020-05-23 ENCOUNTER — Encounter (HOSPITAL_COMMUNITY): Payer: Self-pay | Admitting: Surgery

## 2020-06-07 ENCOUNTER — Other Ambulatory Visit: Payer: Self-pay | Admitting: Endocrinology

## 2020-06-16 ENCOUNTER — Other Ambulatory Visit: Payer: Self-pay

## 2020-06-16 ENCOUNTER — Other Ambulatory Visit: Payer: Managed Care, Other (non HMO) | Admitting: *Deleted

## 2020-06-16 DIAGNOSIS — E785 Hyperlipidemia, unspecified: Secondary | ICD-10-CM

## 2020-06-16 LAB — HEPATIC FUNCTION PANEL
ALT: 46 IU/L — ABNORMAL HIGH (ref 0–44)
AST: 31 IU/L (ref 0–40)
Albumin: 3.9 g/dL (ref 3.8–4.8)
Alkaline Phosphatase: 55 IU/L (ref 44–121)
Bilirubin Total: 0.4 mg/dL (ref 0.0–1.2)
Bilirubin, Direct: 0.13 mg/dL (ref 0.00–0.40)
Total Protein: 6.3 g/dL (ref 6.0–8.5)

## 2020-06-16 LAB — LIPID PANEL
Chol/HDL Ratio: 2.4 ratio (ref 0.0–5.0)
Cholesterol, Total: 123 mg/dL (ref 100–199)
HDL: 51 mg/dL (ref >39)
LDL Chol Calc (NIH): 56 mg/dL (ref 0–99)
Triglycerides: 81 mg/dL (ref 0–149)
VLDL Cholesterol Cal: 16 mg/dL (ref 5–40)

## 2020-06-19 ENCOUNTER — Telehealth: Payer: Self-pay | Admitting: *Deleted

## 2020-06-19 DIAGNOSIS — I2510 Atherosclerotic heart disease of native coronary artery without angina pectoris: Secondary | ICD-10-CM

## 2020-06-19 DIAGNOSIS — E785 Hyperlipidemia, unspecified: Secondary | ICD-10-CM

## 2020-06-19 NOTE — Telephone Encounter (Signed)
Pt has been notified of lab results and recommendations by phone with verbal understanding. Pt is agreeable to repeat fasting labs in 6 months. Lab orders for FLP/LFT have been placed and lab appt for 12/18/20 has been made. Pt thanked me for the call and the help. Patient notified of result.  Please refer to phone note from today for complete details.   Danielle Rankin, CMA 06/19/2020 9:20 AM

## 2020-06-19 NOTE — Telephone Encounter (Signed)
-----   Message from Wendall Stade, MD sent at 06/16/2020  5:16 PM EDT ----- Cholesterol is at goal and LFT's are normal F/U labs in 6 months

## 2020-06-27 ENCOUNTER — Other Ambulatory Visit: Payer: Self-pay | Admitting: Endocrinology

## 2020-07-10 ENCOUNTER — Encounter: Payer: Self-pay | Admitting: Family Medicine

## 2020-07-10 ENCOUNTER — Other Ambulatory Visit: Payer: Self-pay

## 2020-07-10 DIAGNOSIS — H9190 Unspecified hearing loss, unspecified ear: Secondary | ICD-10-CM

## 2020-07-11 ENCOUNTER — Ambulatory Visit (INDEPENDENT_AMBULATORY_CARE_PROVIDER_SITE_OTHER): Payer: Managed Care, Other (non HMO)

## 2020-07-11 DIAGNOSIS — I255 Ischemic cardiomyopathy: Secondary | ICD-10-CM

## 2020-07-11 LAB — CUP PACEART REMOTE DEVICE CHECK
Battery Remaining Longevity: 50 mo
Battery Voltage: 2.97 V
Brady Statistic AP VP Percent: 15.73 %
Brady Statistic AP VS Percent: 0.24 %
Brady Statistic AS VP Percent: 82.71 %
Brady Statistic AS VS Percent: 1.31 %
Brady Statistic RA Percent Paced: 15.91 %
Brady Statistic RV Percent Paced: 16.71 %
Date Time Interrogation Session: 20220607033425
HighPow Impedance: 64 Ohm
Implantable Lead Implant Date: 20171206
Implantable Lead Implant Date: 20171206
Implantable Lead Implant Date: 20171206
Implantable Lead Location: 753858
Implantable Lead Location: 753859
Implantable Lead Location: 753860
Implantable Lead Model: 4598
Implantable Lead Model: 5076
Implantable Pulse Generator Implant Date: 20171206
Lead Channel Impedance Value: 190 Ohm
Lead Channel Impedance Value: 190 Ohm
Lead Channel Impedance Value: 194.634
Lead Channel Impedance Value: 194.634
Lead Channel Impedance Value: 194.634
Lead Channel Impedance Value: 323 Ohm
Lead Channel Impedance Value: 380 Ohm
Lead Channel Impedance Value: 380 Ohm
Lead Channel Impedance Value: 380 Ohm
Lead Channel Impedance Value: 399 Ohm
Lead Channel Impedance Value: 399 Ohm
Lead Channel Impedance Value: 437 Ohm
Lead Channel Impedance Value: 437 Ohm
Lead Channel Impedance Value: 646 Ohm
Lead Channel Impedance Value: 646 Ohm
Lead Channel Impedance Value: 646 Ohm
Lead Channel Impedance Value: 665 Ohm
Lead Channel Impedance Value: 665 Ohm
Lead Channel Pacing Threshold Amplitude: 0.375 V
Lead Channel Pacing Threshold Amplitude: 1 V
Lead Channel Pacing Threshold Amplitude: 1.25 V
Lead Channel Pacing Threshold Pulse Width: 0.4 ms
Lead Channel Pacing Threshold Pulse Width: 0.4 ms
Lead Channel Pacing Threshold Pulse Width: 0.4 ms
Lead Channel Sensing Intrinsic Amplitude: 15.125 mV
Lead Channel Sensing Intrinsic Amplitude: 15.125 mV
Lead Channel Sensing Intrinsic Amplitude: 2 mV
Lead Channel Sensing Intrinsic Amplitude: 2 mV
Lead Channel Setting Pacing Amplitude: 1.75 V
Lead Channel Setting Pacing Amplitude: 2 V
Lead Channel Setting Pacing Amplitude: 2.5 V
Lead Channel Setting Pacing Pulse Width: 0.4 ms
Lead Channel Setting Pacing Pulse Width: 0.4 ms
Lead Channel Setting Sensing Sensitivity: 0.3 mV

## 2020-07-28 ENCOUNTER — Ambulatory Visit: Payer: Managed Care, Other (non HMO) | Admitting: Endocrinology

## 2020-08-02 NOTE — Progress Notes (Signed)
Remote ICD transmission.   

## 2020-08-13 ENCOUNTER — Other Ambulatory Visit: Payer: Self-pay | Admitting: Family Medicine

## 2020-08-14 ENCOUNTER — Other Ambulatory Visit: Payer: Self-pay | Admitting: Family Medicine

## 2020-08-22 ENCOUNTER — Encounter: Payer: Self-pay | Admitting: Family Medicine

## 2020-08-23 ENCOUNTER — Other Ambulatory Visit: Payer: Self-pay

## 2020-08-23 ENCOUNTER — Ambulatory Visit (INDEPENDENT_AMBULATORY_CARE_PROVIDER_SITE_OTHER): Payer: Managed Care, Other (non HMO) | Admitting: Otolaryngology

## 2020-08-23 DIAGNOSIS — H903 Sensorineural hearing loss, bilateral: Secondary | ICD-10-CM | POA: Diagnosis not present

## 2020-08-23 NOTE — Progress Notes (Signed)
HPI: John Carrillo is a 70 y.o. male who presents is referred by his PCP for evaluation of hearing loss.  He does well when there is no background noise but he has a lot of difficulty with his hearing when there is a lot of background noise.  He has noticed gradual decline in his hearing..  Past Medical History:  Diagnosis Date   Acute kidney injury superimposed on chronic kidney disease (HCC) 01/25/2015   AICD (automatic cardioverter/defibrillator) present    CHF (congestive heart failure) (HCC)    CORONARY ARTERY DISEASE 08/11/2006   CVA 12/19/2006   2008   DIABETES MELLITUS, TYPE I 08/11/2006   ECZEMA, HANDS 12/15/2009   History of kidney stones    10+ years ago   HYPERLIPIDEMIA 08/11/2006   HYPERTENSION 08/11/2006   Hypothyroidism    Myocardial infarction [facility])    Presence of permanent cardiac pacemaker    Type 2 diabetes mellitus without complications (HCC) 12/20/2016   Past Surgical History:  Procedure Laterality Date   CARDIAC CATHETERIZATION N/A 01/16/2015   Procedure: Left Heart Cath and Coronary Angiography;  Surgeon: [name], MD;  Location: [facility] INVASIVE CV LAB;  Service: Cardiovascular;  Laterality: N/A;   CORONARY STENT PLACEMENT     EP IMPLANTABLE DEVICE N/A 01/10/2016   MDT Claria MRI Quad CRTD implanted by Dr [name]   INGUINAL HERNIA REPAIR Left 05/22/2020   Procedure: LEFT INGUINAL HERNIA REPAIR WITH MESH;  Surgeon: Abigail Miyamoto, MD;  Location: [facility] OR;  Service: General;  Laterality: Left;   INSERT / REPLACE / REMOVE PACEMAKER     INSERTION OF MESH Left 05/22/2020   Procedure: INSERTION OF MESH;  Surgeon: Abigail Miyamoto, MD;  Location: [facility] OR;  Service: General;  Laterality: Left;   TEE WITHOUT CARDIOVERSION N/A 01/26/2015   Procedure: TRANSESOPHAGEAL ECHOCARDIOGRAM (TEE);  Surgeon: [name], MD;  [facility] ENDOSCOPY;  Service: Cardiovascular;  Laterality: N/A;   TONSILECTOMY, ADENOIDECTOMY, BILATERAL MYRINGOTOMY AND TUBES     TONSILLECTOMY      WISDOM TOOTH EXTRACTION     WRIST FRACTURE SURGERY Right 1960   3rd grade   Social History   Socioeconomic History   Marital status: Married    Spouse name: Not on file   Number of children: Not on file   Years of education: Not on file   Highest education level: Not on file  Occupational History   Occupation: Furniture conservator/restorer    Employer: [employer]  Tobacco Use   Smoking status: Former    Packs/day: 1.00    Years: 30.00    Pack years: 30.00    Types: Cigarettes    Quit date: 05/03/2000    Years since quitting: 20.3   Smokeless tobacco: Never  Vaping Use   Vaping Use: Never used  Substance and Sexual Activity   Alcohol use: Yes    Alcohol/week: 3.0 standard drinks    Types: 1 Glasses of wine, 1 Cans of beer, 1 Shots of liquor per week    Comment: 1-2/month   Drug use: No   Sexual activity: Not on file  Other Topics Concern   Not on file  Social History Narrative   Married (kids are patients here, wife seen elsewhere). 3 step children, 1 biological. No grandkids.    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[employer], [employer], [employer] ([employer]) but also wants to do grad school   Oldest daughter [name] - college of Patent attorney- social work intl business  minor religion- now trying to get foot in door at [employer]- wants to be in Midwife for general dynamics- consider age 85 retirement      Hobbies: race sail boats, adrenaline related activities   Social Determinants of Corporate investment banker Strain: Not on file  Food Insecurity: Not on file  Transportation Needs: Not on file  Physical Activity: Not on file  Stress: Not on file  Social Connections: Not on file   Family History  Problem Relation Age of Onset   CAD Father    Other Mother        passed right before 65- tumor on kidney recent diagnosis   Dementia Mother        progressing rapidly   CAD Paternal Grandfather        39s   CAD Paternal Grandmother        52s    Brain cancer Maternal Grandmother    Lymphoma Sister        b cell   Celiac disease Sister    Diabetes Neg Hx    No Known Allergies Prior to Admission medications   Medication Sig Start Date End Date Taking? Authorizing Provider  acetaminophen (TYLENOL) 325 MG tablet Take 2 tablets (650 mg total) by mouth every 4 (four) hours as needed for headache or mild pain. 01/17/15   Abelino Derrick, PA-C  carvedilol (COREG) 6.25 MG tablet Take 1 tablet (6.25 mg total) by mouth 2 (two) times daily. 04/07/20   Graciella Freer, PA-C  clopidogrel (PLAVIX) 75 MG tablet Take 1 tablet (75 mg total) by mouth daily. Patient taking differently: Take 75 mg by mouth every evening. 04/20/20   Allred, Fayrene Fearing, MD  Coenzyme Q10 (COQ10) 200 MG CAPS Take 200 mg by mouth in the morning.    [provider]  ezetimibe (ZETIA) 10 MG tablet TAKE 1 TABLET(10 MG) BY MOUTH DAILY 08/14/20   [name], MD  FARXIGA 5 MG TABS tablet TAKE 1 TABLET(5 MG) BY MOUTH DAILY BEFORE BREAKFAST 06/29/20   Romero Belling, MD  glucose blood (ONETOUCH VERIO) test strip 1 each by Other route 2 (two) times daily. And lancets 2/day 10/25/16   Romero Belling, MD  insulin aspart (NOVOLOG FLEXPEN) 100 UNIT/ML FlexPen Inject 6-8 Units into the skin 3 (three) times daily with meals. Sliding scale 06/07/20   Romero Belling, MD  Insulin Glargine-yfgn (SEMGLEE, YFGN,) 100 UNIT/ML SOPN Inject 30 Units into the skin daily with breakfast. 04/07/20   Romero Belling, MD  levothyroxine (SYNTHROID) 100 MCG tablet TAKE 1 TABLET(100 MCG) BY MOUTH DAILY Patient taking differently: Take 100 mcg by mouth daily before breakfast. 12/19/19   [name], MD  oxyCODONE (OXY IR/ROXICODONE) 5 MG immediate release tablet Take 1 tablet (5 mg total) by mouth every 6 (six) hours as needed for moderate pain or severe pain. 05/22/20   Abigail Miyamoto, MD  Probiotic Product (PROBIOTIC PO) Take 1 capsule by mouth every morning.    [provider]   rosuvastatin (CRESTOR) 20 MG tablet Take 1 tablet (20 mg total) by mouth daily. Patient taking differently: Take 20 mg by mouth every evening. 04/07/20   Graciella Freer, PA-C  sacubitril-valsartan (ENTRESTO) 49-51 MG Take 1 tablet by mouth in the morning and at bedtime. 04/25/20   Wendall Stade, MD  tadalafil (CIALIS) 5 MG tablet Take 1 tablet (5 mg total) by mouth daily. Patient need an office visit with Dr.  Hunter for further refills. Patient taking differently: Take 5 mg by mouth at bedtime. Patient need an office visit with Dr. Durene Cal for further refills. 03/14/20   [name], MD     Positive ROS: Otherwise negative  All other systems have been reviewed and were otherwise negative with the exception of those mentioned in the HPI and as above.  Physical Exam: Constitutional: Alert, well-appearing, no acute distress Ears: External ears without lesions or tenderness. Ear canals are clear bilaterally with intact, clear TMs bilaterally. Nasal: External nose without lesions. Septum with minimal deformity and mild rhinitis. Clear nasal passages Oral: Lips and gums without lesions. Tongue and palate mucosa without lesions. Posterior oropharynx clear. Neck: No palpable adenopathy or masses Respiratory: Breathing comfortably  Skin: No facial/neck lesions or rash noted.  Audiologic testing demonstrated normal hearing in both ears up to 2000 frequency and then a moderate to severe bilateral sensorineural hearing loss in both ears above 2000 frequency.  He had type A tympanograms bilaterally.  SRT's were 10 dB bilaterally.  Procedures  Assessment: Bilateral symmetric high-frequency sensorineural hearing loss  Plan: He would be a candidate for hearing aids and will follow-up with our audiologist concerning obtaining hearing aids.   Narda Bonds, MD   CC:

## 2020-09-01 ENCOUNTER — Other Ambulatory Visit: Payer: Self-pay | Admitting: Endocrinology

## 2020-09-07 ENCOUNTER — Encounter (INDEPENDENT_AMBULATORY_CARE_PROVIDER_SITE_OTHER): Payer: Self-pay

## 2020-10-02 ENCOUNTER — Ambulatory Visit (INDEPENDENT_AMBULATORY_CARE_PROVIDER_SITE_OTHER): Payer: Managed Care, Other (non HMO) | Admitting: Endocrinology

## 2020-10-02 ENCOUNTER — Other Ambulatory Visit: Payer: Self-pay

## 2020-10-02 VITALS — BP 130/76 | HR 61 | Ht 69.0 in | Wt 179.0 lb

## 2020-10-02 DIAGNOSIS — E1122 Type 2 diabetes mellitus with diabetic chronic kidney disease: Secondary | ICD-10-CM | POA: Diagnosis not present

## 2020-10-02 DIAGNOSIS — Z794 Long term (current) use of insulin: Secondary | ICD-10-CM | POA: Diagnosis not present

## 2020-10-02 DIAGNOSIS — E1151 Type 2 diabetes mellitus with diabetic peripheral angiopathy without gangrene: Secondary | ICD-10-CM | POA: Diagnosis not present

## 2020-10-02 DIAGNOSIS — N183 Chronic kidney disease, stage 3 unspecified: Secondary | ICD-10-CM | POA: Diagnosis not present

## 2020-10-02 LAB — POCT GLYCOSYLATED HEMOGLOBIN (HGB A1C): Hemoglobin A1C: 7.4 % — AB (ref 4.0–5.6)

## 2020-10-02 MED ORDER — ONETOUCH VERIO W/DEVICE KIT: 1.0000 | PACK | Freq: Every day | 0 refills | Status: AC

## 2020-10-02 NOTE — Patient Instructions (Addendum)
Please continue the same insulins.  However, take less novolog is exercise is upcoming.   check your blood sugar twice a day.  vary the time of day when you check, between before the 3 meals, and at bedtime.  also check if you have symptoms of your blood sugar being too high or too low.  please keep a record of the readings and bring it to your next appointment here (or you can bring the meter itself).  You can write it on any piece of paper.  please call us sooner if your blood sugar goes below 70, or if you have a lot of readings over 200.   Please come back for a follow-up appointment in 3-4 months.

## 2020-10-02 NOTE — Progress Notes (Signed)
Subjective:    Patient ID: John Carrillo, male    DOB: 1951-01-28, 70 y.o.   MRN: [id number]  HPI Pt returns for f/u of diabetes mellitus:  DM type: Insulin-requiring type 2.   Dx'ed: 2008. Complications: CVA, PAD, CRI, and CAD.   Therapy: insulin since soon after dx, and Farxiga.  DKA: never.  Severe hypoglycemia: never.   Pancreatitis: never.   Other: he declines pump, including V-GO; he declines continuous glucose monitor; he takes multiple daily injections, but basal insulin is emphasized, after poor results with usual multiple daily injections regimen; he declines GLP   Interval history: He reports mild hypoglycemia approx QOD.  This happens mid-afternoon.  Pt says cbg varies from 65-200's.  He has retired.   Past Medical History:  Diagnosis Date   Acute kidney injury superimposed on chronic kidney disease (HCC) 01/25/2015   AICD (automatic cardioverter/defibrillator) present    CHF (congestive heart failure) (HCC)    CORONARY ARTERY DISEASE 08/11/2006   CVA 12/19/2006   2008   DIABETES MELLITUS, TYPE I 08/11/2006   ECZEMA, HANDS 12/15/2009   History of kidney stones    10+ years ago   HYPERLIPIDEMIA 08/11/2006   HYPERTENSION 08/11/2006   Hypothyroidism    Myocardial infarction [facility])    Presence of permanent cardiac pacemaker    Type 2 diabetes mellitus without complications (HCC) 12/20/2016    Past Surgical History:  Procedure Laterality Date   CARDIAC CATHETERIZATION N/A 01/16/2015   Procedure: Left Heart Cath and Coronary Angiography;  Surgeon: [name], MD;  Location: [facility] INVASIVE CV LAB;  Service: Cardiovascular;  Laterality: N/A;   CORONARY STENT PLACEMENT     EP IMPLANTABLE DEVICE N/A 01/10/2016   MDT Claria MRI Quad CRTD implanted by Dr [name]   INGUINAL HERNIA REPAIR Left 05/22/2020   Procedure: LEFT INGUINAL HERNIA REPAIR WITH MESH;  Surgeon: Abigail Miyamoto, MD;  Location: [facility] OR;  Service: General;  Laterality: Left;   INSERT / REPLACE / REMOVE  PACEMAKER     INSERTION OF MESH Left 05/22/2020   Procedure: INSERTION OF MESH;  Surgeon: Abigail Miyamoto, MD;  Location: [facility] OR;  Service: General;  Laterality: Left;   TEE WITHOUT CARDIOVERSION N/A 01/26/2015   Procedure: TRANSESOPHAGEAL ECHOCARDIOGRAM (TEE);  Surgeon: [name], MD;  Location: [facility] ENDOSCOPY;  Service: Cardiovascular;  Laterality: N/A;   TONSILECTOMY, ADENOIDECTOMY, BILATERAL MYRINGOTOMY AND TUBES     TONSILLECTOMY     WISDOM TOOTH EXTRACTION     WRIST FRACTURE SURGERY Right 1960   3rd grade    Social History   Socioeconomic History   Marital status: Married    Spouse name: Not on file   Number of children: Not on file   Years of education: Not on file   Highest education level: Not on file  Occupational History   Occupation: Furniture conservator/restorer    Employer: [employer]  Tobacco Use   Smoking status: Former    Packs/day: 1.00    Years: 30.00    Pack years: 30.00    Types: Cigarettes    Quit date: 05/03/2000    Years since quitting: 20.4   Smokeless tobacco: Never  Vaping Use   Vaping Use: Never used  Substance and Sexual Activity   Alcohol use: Yes    Alcohol/week: 3.0 standard drinks    Types: 1 Glasses of wine, 1 Cans of beer, 1 Shots of liquor per week    Comment: 1-2/month   Drug use: No   Sexual activity:  Not on file  Other Topics Concern   Not on file  Social History Narrative   Married (kids are patients here, wife seen elsewhere). 3 step children, 1 biological. No grandkids.    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   Oldest daughter [name] - college of Patent attorney- social work intl business minor religion- now trying to get foot in door at [employer]- wants to be in Midwife for general dynamics- consider age 50 retirement      Hobbies: race sail boats, adrenaline related activities   Social Determinants of Research scientist (physical sciences) Strain: Not on Ship broker Insecurity: Not on file  Transportation Needs: Not on file  Physical Activity: Not on file  Stress: Not on file  Social Connections: Not on file  Intimate Partner Violence: Not on file    Current Outpatient Medications on File Prior to Visit  Medication Sig Dispense Refill   acetaminophen (TYLENOL) 325 MG tablet Take 2 tablets (650 mg total) by mouth every 4 (four) hours as needed for headache or mild pain.     carvedilol (COREG) 6.25 MG tablet Take 1 tablet (6.25 mg total) by mouth 2 (two) times daily. 180 tablet 3   clopidogrel (PLAVIX) 75 MG tablet Take 1 tablet (75 mg total) by mouth daily. (Patient taking differently: Take 75 mg by mouth every evening.) 90 tablet 3   Coenzyme Q10 (COQ10) 200 MG CAPS Take 200 mg by mouth in the morning.     ezetimibe (ZETIA) 10 MG tablet TAKE 1 TABLET(10 MG) BY MOUTH DAILY 90 tablet 3   FARXIGA 5 MG TABS tablet TAKE 1 TABLET(5 MG) BY MOUTH DAILY BEFORE BREAKFAST 90 tablet 3   glucose blood (ONETOUCH VERIO) test strip 1 each by Other route 2 (two) times daily. And lancets 2/day 200 each 12   insulin aspart (NOVOLOG FLEXPEN) 100 UNIT/ML FlexPen Inject 6-8 Units into the skin 3 (three) times daily with meals. Sliding scale 15 mL 3   Insulin Glargine-yfgn (SEMGLEE, YFGN,) 100 UNIT/ML SOPN Inject 30 Units into the skin daily with breakfast. 45 mL 3   levothyroxine (SYNTHROID) 100 MCG tablet TAKE 1 TABLET(100 MCG) BY MOUTH DAILY (Patient taking differently: Take 100 mcg by mouth daily before breakfast.) 90 tablet 3   oxyCODONE (OXY IR/ROXICODONE) 5 MG immediate release tablet Take 1 tablet (5 mg total) by mouth every 6 (six) hours as needed for moderate pain or severe pain. 20 tablet 0   Probiotic Product (PROBIOTIC PO) Take 1 capsule by mouth every morning.     rosuvastatin (CRESTOR) 20 MG tablet Take 1 tablet (20 mg total) by mouth daily. (Patient taking differently: Take 20 mg by mouth every evening.) 90 tablet 3    sacubitril-valsartan (ENTRESTO) 49-51 MG Take 1 tablet by mouth in the morning and at bedtime. 180 tablet 3   tadalafil (CIALIS) 5 MG tablet Take 1 tablet (5 mg total) by mouth daily. Patient need an office visit with Dr. Durene Cal for further refills. (Patient taking differently: Take 5 mg by mouth at bedtime. Patient need an office visit with Dr. Durene Cal for further refills.) 90 tablet 3   No current facility-administered medications on file prior to visit.    No Known Allergies  Family History  Problem Relation Age of Onset   CAD Father    Other Mother  passed right before 90- tumor on kidney recent diagnosis   Dementia Mother        progressing rapidly   CAD Paternal Grandfather        40s   CAD Paternal Grandmother        13s   Brain cancer Maternal Grandmother    Lymphoma Sister        b cell   Celiac disease Sister    Diabetes Neg Hx     BP 130/76 (BP Location: Right Arm, Patient Position: Sitting, Cuff Size: Normal)   Pulse 61   Ht 5\' 9"  (1.753 m)   Wt 179 lb (81.2 kg)   SpO2 96%   BMI 26.43 kg/m    Review of Systems     Objective:   Physical Exam Pulses: dorsalis pedis intact bilat.   MSK: no deformity of the feet CV: no leg edema Skin:  no ulcer on the feet.  normal color and temp on the feet. Neuro: sensation is intact to touch on the feet  Lab Results  Component Value Date   HGBA1C 7.4 (A) 10/02/2020       Assessment & Plan:  Insulin-requiring type 2 DM.  Hypoglycemia, due to insulin.    Patient Instructions  Please continue the same insulins.  However, take less novolog is exercise is upcoming.   check your blood sugar twice a day.  vary the time of day when you check, between before the 3 meals, and at bedtime.  also check if you have symptoms of your blood sugar being too high or too low.  please keep a record of the readings and bring it to your next appointment here (or you can bring the meter itself).  You can write it on any piece of  paper.  please call 10/04/2020 sooner if your blood sugar goes below 70, or if you have a lot of readings over 200.   Please come back for a follow-up appointment in 3-4 months.

## 2020-10-05 ENCOUNTER — Encounter: Payer: Self-pay | Admitting: Family Medicine

## 2020-10-10 ENCOUNTER — Ambulatory Visit (INDEPENDENT_AMBULATORY_CARE_PROVIDER_SITE_OTHER): Payer: Managed Care, Other (non HMO)

## 2020-10-10 DIAGNOSIS — I255 Ischemic cardiomyopathy: Secondary | ICD-10-CM | POA: Diagnosis not present

## 2020-10-11 LAB — CUP PACEART REMOTE DEVICE CHECK
Battery Remaining Longevity: 45 mo
Battery Voltage: 2.96 V
Brady Statistic AP VP Percent: 22.53 %
Brady Statistic AP VS Percent: 0.36 %
Brady Statistic AS VP Percent: 75.88 %
Brady Statistic AS VS Percent: 1.23 %
Brady Statistic RA Percent Paced: 22.77 %
Brady Statistic RV Percent Paced: 23.53 %
Date Time Interrogation Session: 20220906232830
HighPow Impedance: 59 Ohm
Implantable Lead Implant Date: 20171206
Implantable Lead Implant Date: 20171206
Implantable Lead Implant Date: 20171206
Implantable Lead Location: 753858
Implantable Lead Location: 753859
Implantable Lead Location: 753860
Implantable Lead Model: 4598
Implantable Lead Model: 5076
Implantable Pulse Generator Implant Date: 20171206
Lead Channel Impedance Value: 190 Ohm
Lead Channel Impedance Value: 190 Ohm
Lead Channel Impedance Value: 190 Ohm
Lead Channel Impedance Value: 194.634
Lead Channel Impedance Value: 194.634
Lead Channel Impedance Value: 304 Ohm
Lead Channel Impedance Value: 380 Ohm
Lead Channel Impedance Value: 380 Ohm
Lead Channel Impedance Value: 380 Ohm
Lead Channel Impedance Value: 380 Ohm
Lead Channel Impedance Value: 380 Ohm
Lead Channel Impedance Value: 399 Ohm
Lead Channel Impedance Value: 456 Ohm
Lead Channel Impedance Value: 646 Ohm
Lead Channel Impedance Value: 665 Ohm
Lead Channel Impedance Value: 665 Ohm
Lead Channel Impedance Value: 665 Ohm
Lead Channel Impedance Value: 703 Ohm
Lead Channel Pacing Threshold Amplitude: 0.5 V
Lead Channel Pacing Threshold Amplitude: 0.875 V
Lead Channel Pacing Threshold Amplitude: 1.125 V
Lead Channel Pacing Threshold Pulse Width: 0.4 ms
Lead Channel Pacing Threshold Pulse Width: 0.4 ms
Lead Channel Pacing Threshold Pulse Width: 0.4 ms
Lead Channel Sensing Intrinsic Amplitude: 16.5 mV
Lead Channel Sensing Intrinsic Amplitude: 16.5 mV
Lead Channel Sensing Intrinsic Amplitude: 3.625 mV
Lead Channel Sensing Intrinsic Amplitude: 3.625 mV
Lead Channel Setting Pacing Amplitude: 1.75 V
Lead Channel Setting Pacing Amplitude: 2 V
Lead Channel Setting Pacing Amplitude: 2.5 V
Lead Channel Setting Pacing Pulse Width: 0.4 ms
Lead Channel Setting Pacing Pulse Width: 0.4 ms
Lead Channel Setting Sensing Sensitivity: 0.3 mV

## 2020-10-16 ENCOUNTER — Encounter: Payer: Self-pay | Admitting: Endocrinology

## 2020-10-17 LAB — HM DIABETES EYE EXAM

## 2020-10-19 ENCOUNTER — Encounter: Payer: Self-pay | Admitting: Family Medicine

## 2020-10-19 NOTE — Progress Notes (Signed)
Remote ICD transmission.   

## 2020-11-06 ENCOUNTER — Encounter: Payer: Self-pay | Admitting: Endocrinology

## 2020-11-08 ENCOUNTER — Other Ambulatory Visit: Payer: Self-pay | Admitting: Endocrinology

## 2020-11-08 MED ORDER — LANTUS SOLOSTAR 100 UNIT/ML ~~LOC~~ SOPN: 30.0000 [IU] | PEN_INJECTOR | Freq: Every day | SUBCUTANEOUS | 99 refills | Status: DC

## 2020-11-08 MED ORDER — INSULIN LISPRO (1 UNIT DIAL) 100 UNIT/ML (KWIKPEN): 6.0000 [IU] | PEN_INJECTOR | Freq: Three times a day (TID) | SUBCUTANEOUS | 3 refills | Status: DC

## 2020-11-16 NOTE — Progress Notes (Signed)
Electrophysiology Office Note Date: 11/16/2020  ID:  John Carrillo, DOB 1950-10-08, MRN [id number]  PCP: Marin Olp, MD Primary Cardiologist: Jenkins Rouge, MD Electrophysiologist: Thompson Grayer, MD   CC: Routine ICD follow-up  John Carrillo is a 70 y.o. male seen today for Thompson Grayer, MD for routine electrophysiology followup.  Since last being seen in our clinic the patient reports doing well. He has started exercising and has noticed mild SOB when he gets his HR up, but nothing undue.  he denies chest pain, palpitations, dyspnea, PND, orthopnea, nausea, vomiting, dizziness, syncope, edema, weight gain, or early satiety. He has not had ICD shocks.   Device History: MDT CRTD implanted 2017 for ICM, CHF History of appropriate therapy: No History of AAD therapy: No AV opt done by Chanetta Marshall 12/2016 - best with adaptiveCRT on (no programming changes made)   Past Medical History:  Diagnosis Date   Acute kidney injury superimposed on chronic kidney disease ([place]) 01/25/2015   AICD (automatic cardioverter/defibrillator) present    CHF (congestive heart failure) ([place])    CORONARY ARTERY DISEASE 08/11/2006   CVA 12/19/2006   2008   DIABETES MELLITUS, TYPE I 08/11/2006   ECZEMA, HANDS 12/15/2009   History of kidney stones    10+ years ago   HYPERLIPIDEMIA 08/11/2006   HYPERTENSION 08/11/2006   Hypothyroidism    Myocardial infarction [facility])    Presence of permanent cardiac pacemaker    Type 2 diabetes mellitus without complications ([place]) 44/31/5400   Past Surgical History:  Procedure Laterality Date   CARDIAC CATHETERIZATION N/A 01/16/2015   Procedure: Left Heart Cath and Coronary Angiography;  Surgeon: Lorretta Harp, MD;  Location: [place] [facility];  Service: Cardiovascular;  Laterality: N/A;   CORONARY STENT PLACEMENT     EP IMPLANTABLE DEVICE N/A 01/10/2016   MDT Claria MRI Quad CRTD implanted by Dr Rayann Heman   INGUINAL HERNIA REPAIR Left 05/22/2020   Procedure: LEFT  INGUINAL HERNIA REPAIR WITH MESH;  Surgeon: Coralie Keens, MD;  Location: [place];  Service: General;  Laterality: Left;   INSERT / REPLACE / REMOVE PACEMAKER     INSERTION OF MESH Left 05/22/2020   Procedure: INSERTION OF MESH;  Surgeon: Coralie Keens, MD;  Location: [place];  Service: General;  Laterality: Left;   TEE WITHOUT CARDIOVERSION N/A 01/26/2015   Procedure: TRANSESOPHAGEAL ECHOCARDIOGRAM (TEE);  Surgeon: Sanda Klein, MD;  Location: [facility] ENDOSCOPY;  Service: Cardiovascular;  Laterality: N/A;   TONSILECTOMY, ADENOIDECTOMY, BILATERAL MYRINGOTOMY AND TUBES     TONSILLECTOMY     WISDOM TOOTH EXTRACTION     WRIST FRACTURE SURGERY Right 1960   3rd grade    Current Outpatient Medications  Medication Sig Dispense Refill   acetaminophen (TYLENOL) 325 MG tablet Take 2 tablets (650 mg total) by mouth every 4 (four) hours as needed for headache or mild pain.     Blood Glucose Monitoring Suppl (ONETOUCH VERIO) w/Device KIT 1 Device by Does not apply route daily. 1 kit 0   carvedilol (COREG) 6.25 MG tablet Take 1 tablet (6.25 mg total) by mouth 2 (two) times daily. 180 tablet 3   clopidogrel (PLAVIX) 75 MG tablet Take 1 tablet (75 mg total) by mouth daily. (Patient taking differently: Take 75 mg by mouth every evening.) 90 tablet 3   Coenzyme Q10 (COQ10) 200 MG CAPS Take 200 mg by mouth in the morning.     ezetimibe (ZETIA) 10 MG tablet TAKE 1 TABLET(10 MG) BY MOUTH DAILY 90  tablet 3   FARXIGA 5 MG TABS tablet TAKE 1 TABLET(5 MG) BY MOUTH DAILY BEFORE BREAKFAST 90 tablet 3   glucose blood (ONETOUCH VERIO) test strip 1 each by Other route 2 (two) times daily. And lancets 2/day 200 each 12   insulin glargine (LANTUS SOLOSTAR) 100 UNIT/ML Solostar Pen Inject 30 Units into the skin daily. 30 mL PRN   insulin lispro (HUMALOG KWIKPEN) 100 UNIT/ML KwikPen Inject 6-8 Units into the skin 3 (three) times daily with meals. And pen needles 4/day 30 mL 3   levothyroxine (SYNTHROID) 100 MCG tablet TAKE 1  TABLET(100 MCG) BY MOUTH DAILY (Patient taking differently: Take 100 mcg by mouth daily before breakfast.) 90 tablet 3   oxyCODONE (OXY IR/ROXICODONE) 5 MG immediate release tablet Take 1 tablet (5 mg total) by mouth every 6 (six) hours as needed for moderate pain or severe pain. 20 tablet 0   Probiotic Product (PROBIOTIC PO) Take 1 capsule by mouth every morning.     rosuvastatin (CRESTOR) 20 MG tablet Take 1 tablet (20 mg total) by mouth daily. (Patient taking differently: Take 20 mg by mouth every evening.) 90 tablet 3   sacubitril-valsartan (ENTRESTO) 49-51 MG Take 1 tablet by mouth in the morning and at bedtime. 180 tablet 3   tadalafil (CIALIS) 5 MG tablet Take 1 tablet (5 mg total) by mouth daily. Patient need an office visit with Dr. Yong Channel for further refills. (Patient taking differently: Take 5 mg by mouth at bedtime. Patient need an office visit with Dr. Yong Channel for further refills.) 90 tablet 3   No current facility-administered medications for this visit.    Allergies:   Patient has no known allergies.   Social History: Social History   Socioeconomic History   Marital status: Married    Spouse name: Not on file   Number of children: Not on file   Years of education: Not on file   Highest education level: Not on file  Occupational History   Occupation: Software engineer: [employer]  Tobacco Use   Smoking status: Former    Packs/day: 1.00    Years: 30.00    Pack years: 30.00    Types: Cigarettes    Quit date: 05/03/2000    Years since quitting: 20.5   Smokeless tobacco: Never  Vaping Use   Vaping Use: Never used  Substance and Sexual Activity   Alcohol use: Yes    Alcohol/week: 3.0 standard drinks    Types: 1 Glasses of wine, 1 Cans of beer, 1 Shots of liquor per week    Comment: 1-2/month   Drug use: No   Sexual activity: Not on file  Other Topics Concern   Not on file  Social History Narrative   Married (kids are patients here, wife seen  elsewhere). 3 step children, 1 biological. No grandkids.    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   Oldest daughter Ledell Noss - college of [employer] work intl business minor religion- now trying to get foot in door at [employer]- wants to be in Merchandiser, retail for general dynamics- consider age 72 retirement      Hobbies: race sail boats, adrenaline related activities   Social Determinants of Radio broadcast assistant Strain: Not on file  Food Insecurity: Not on file  Transportation Needs: Not on file  Physical Activity:  Not on file  Stress: Not on file  Social Connections: Not on file  Intimate Partner Violence: Not on file    Family History: Family History  Problem Relation Age of Onset   CAD Father    Other Mother        passed right before 59- tumor on kidney recent diagnosis   Dementia Mother        progressing rapidly   CAD Paternal Grandfather        29s   CAD Paternal Grandmother        19s   Brain cancer Maternal Grandmother    Lymphoma Sister        b cell   Celiac disease Sister    Diabetes Neg Hx     Review of Systems: All other systems reviewed and are otherwise negative except as noted above.   Physical Exam: There were no vitals filed for this visit.   GEN- The patient is well appearing, alert and oriented x 3 today.   HEENT: normocephalic, atraumatic; sclera clear, conjunctiva pink; hearing intact; oropharynx clear; neck supple, no JVP Lymph- no cervical lymphadenopathy Lungs- Clear to ausculation bilaterally, normal work of breathing.  No wheezes, rales, rhonchi Heart- Regular rate and rhythm, no murmurs, rubs or gallops, PMI not laterally displaced GI- soft, non-tender, non-distended, bowel sounds present, no hepatosplenomegaly Extremities- no clubbing or cyanosis. No edema; DP/PT/radial pulses 2+ bilaterally MS- no significant deformity or  atrophy Skin- warm and dry, no rash or lesion; ICD pocket well healed Psych- euthymic mood, full affect Neuro- strength and sensation are intact  ICD interrogation- reviewed in detail today,  See PACEART report  EKG:  EKG is not ordered today.  Recent Labs: 03/14/2020: TSH 4.42 05/18/2020: BUN 21; Creatinine, Ser 1.32; Hemoglobin 16.4; Platelets 195; Potassium 5.0; Sodium 137 06/16/2020: ALT 46   Wt Readings from Last 3 Encounters:  10/02/20 179 lb (81.2 kg)  05/22/20 179 lb 6 oz (81.4 kg)  05/18/20 179 lb 6 oz (81.4 kg)     Other studies Reviewed: Additional studies/ records that were reviewed today include: Previous EP office notes.   Assessment and Plan:  1.  Chronic systolic dysfunction s/p Medtronic CRT-D  euvolemic today Stable on an appropriate medical regimen Normal ICD function See Pace Art report No changes today  2. CAD Denies s/s ischemia    3. Prior CVA No AF to date on device interrogation. Follow   4. Sustained VT Quiescent   5. DM2 Per PCP   Current medicines are reviewed at length with the patient today.    Labs/ tests ordered today include:  Orders Placed This Encounter  Procedures   Basic metabolic panel   CBC     Disposition:   Follow up with EP APP in 6 months    Signed, Shirley Friar, PA-C  11/16/2020 9:50 AM  [facility] [address] [place] [address] [phone] (office) [phone] (fax)

## 2020-11-17 ENCOUNTER — Ambulatory Visit: Payer: Managed Care, Other (non HMO) | Admitting: Student

## 2020-11-17 ENCOUNTER — Encounter: Payer: Self-pay | Admitting: Student

## 2020-11-17 ENCOUNTER — Other Ambulatory Visit: Payer: Self-pay

## 2020-11-17 VITALS — BP 118/70 | HR 72 | Ht 69.0 in | Wt 174.6 lb

## 2020-11-17 DIAGNOSIS — I2510 Atherosclerotic heart disease of native coronary artery without angina pectoris: Secondary | ICD-10-CM | POA: Diagnosis not present

## 2020-11-17 DIAGNOSIS — I10 Essential (primary) hypertension: Secondary | ICD-10-CM | POA: Diagnosis not present

## 2020-11-17 DIAGNOSIS — I255 Ischemic cardiomyopathy: Secondary | ICD-10-CM

## 2020-11-17 DIAGNOSIS — Z95810 Presence of automatic (implantable) cardiac defibrillator: Secondary | ICD-10-CM

## 2020-11-17 DIAGNOSIS — I472 Ventricular tachycardia, unspecified: Secondary | ICD-10-CM

## 2020-11-17 DIAGNOSIS — I5020 Unspecified systolic (congestive) heart failure: Secondary | ICD-10-CM

## 2020-11-17 LAB — CUP PACEART INCLINIC DEVICE CHECK
Battery Remaining Longevity: 43 mo
Battery Voltage: 2.96 V
Brady Statistic AP VP Percent: 19.46 %
Brady Statistic AP VS Percent: 0.3 %
Brady Statistic AS VP Percent: 78.97 %
Brady Statistic AS VS Percent: 1.26 %
Brady Statistic RA Percent Paced: 19.68 %
Brady Statistic RV Percent Paced: 20.45 %
Date Time Interrogation Session: 20221014090548
HighPow Impedance: 63 Ohm
Implantable Lead Implant Date: 20171206
Implantable Lead Implant Date: 20171206
Implantable Lead Implant Date: 20171206
Implantable Lead Location: 753858
Implantable Lead Location: 753859
Implantable Lead Location: 753860
Implantable Lead Model: 4598
Implantable Lead Model: 5076
Implantable Pulse Generator Implant Date: 20171206
Lead Channel Impedance Value: 190 Ohm
Lead Channel Impedance Value: 194.634
Lead Channel Impedance Value: 194.634
Lead Channel Impedance Value: 194.634
Lead Channel Impedance Value: 199.5 Ohm
Lead Channel Impedance Value: 323 Ohm
Lead Channel Impedance Value: 380 Ohm
Lead Channel Impedance Value: 380 Ohm
Lead Channel Impedance Value: 380 Ohm
Lead Channel Impedance Value: 380 Ohm
Lead Channel Impedance Value: 399 Ohm
Lead Channel Impedance Value: 399 Ohm
Lead Channel Impedance Value: 437 Ohm
Lead Channel Impedance Value: 646 Ohm
Lead Channel Impedance Value: 665 Ohm
Lead Channel Impedance Value: 703 Ohm
Lead Channel Impedance Value: 703 Ohm
Lead Channel Impedance Value: 703 Ohm
Lead Channel Pacing Threshold Amplitude: 0.5 V
Lead Channel Pacing Threshold Amplitude: 1 V
Lead Channel Pacing Threshold Amplitude: 1.125 V
Lead Channel Pacing Threshold Pulse Width: 0.4 ms
Lead Channel Pacing Threshold Pulse Width: 0.4 ms
Lead Channel Pacing Threshold Pulse Width: 0.4 ms
Lead Channel Sensing Intrinsic Amplitude: 13.375 mV
Lead Channel Sensing Intrinsic Amplitude: 14.125 mV
Lead Channel Sensing Intrinsic Amplitude: 3 mV
Lead Channel Sensing Intrinsic Amplitude: 4.625 mV
Lead Channel Setting Pacing Amplitude: 1.75 V
Lead Channel Setting Pacing Amplitude: 2 V
Lead Channel Setting Pacing Amplitude: 2.5 V
Lead Channel Setting Pacing Pulse Width: 0.4 ms
Lead Channel Setting Pacing Pulse Width: 0.4 ms
Lead Channel Setting Sensing Sensitivity: 0.3 mV

## 2020-11-17 NOTE — Patient Instructions (Signed)
Medication Instructions:  °Your physician recommends that you continue on your current medications as directed. Please refer to the Current Medication list given to you today. ° °*If you need a refill on your cardiac medications before your next appointment, please call your pharmacy* ° ° °Lab Work: °TODAY: BMET, CBC ° °If you have labs (blood work) drawn today and your tests are completely normal, you will receive your results only by: °MyChart Message (if you have MyChart) OR °A paper copy in the mail °If you have any lab test that is abnormal or we need to change your treatment, we will call you to review the results. ° ° °Follow-Up: °At [facility], you and your health needs are our priority.  As part of our continuing mission to provide you with exceptional heart care, we have created designated Provider Care Teams.  These Care Teams include your primary Cardiologist (physician) and Advanced Practice Providers (APPs -  Physician Assistants and Nurse Practitioners) who all work together to provide you with the care you need, when you need it. ° ° °Your next appointment:   °6 month(s) ° °The format for your next appointment:   °In Person ° °Provider:   °You may see James Allred, MD or one of the following Advanced Practice Providers on your designated Care Team:   °Renee Ursuy, PA-C °Michael "Andy" Tillery, PA-C   °

## 2020-11-18 LAB — BASIC METABOLIC PANEL
BUN/Creatinine Ratio: 16 (ref 10–24)
BUN: 19 mg/dL (ref 8–27)
CO2: 26 mmol/L (ref 20–29)
Calcium: 9.1 mg/dL (ref 8.6–10.2)
Chloride: 105 mmol/L (ref 96–106)
Creatinine, Ser: 1.18 mg/dL (ref 0.76–1.27)
Glucose: 81 mg/dL (ref 70–99)
Potassium: 4.1 mmol/L (ref 3.5–5.2)
Sodium: 141 mmol/L (ref 134–144)
eGFR: 67 mL/min/{1.73_m2} (ref >59)

## 2020-11-18 LAB — CBC
Hematocrit: 44.8 % (ref 37.5–51.0)
Hemoglobin: 15.5 g/dL (ref 13.0–17.7)
MCH: 33.8 pg — ABNORMAL HIGH (ref 26.6–33.0)
MCHC: 34.6 g/dL (ref 31.5–35.7)
MCV: 98 fL — ABNORMAL HIGH (ref 79–97)
Platelets: 177 10*3/uL (ref 150–450)
RBC: 4.58 x10E6/uL (ref 4.14–5.80)
RDW: 12.1 % (ref 11.6–15.4)
WBC: 7.7 10*3/uL (ref 3.4–10.8)

## 2020-12-10 ENCOUNTER — Other Ambulatory Visit: Payer: Self-pay | Admitting: Family Medicine

## 2020-12-18 ENCOUNTER — Other Ambulatory Visit: Payer: Self-pay

## 2020-12-18 ENCOUNTER — Other Ambulatory Visit: Payer: Medicare Other

## 2020-12-18 DIAGNOSIS — I2510 Atherosclerotic heart disease of native coronary artery without angina pectoris: Secondary | ICD-10-CM | POA: Diagnosis not present

## 2020-12-18 DIAGNOSIS — E785 Hyperlipidemia, unspecified: Secondary | ICD-10-CM | POA: Diagnosis not present

## 2020-12-18 LAB — HEPATIC FUNCTION PANEL
ALT: 67 IU/L — ABNORMAL HIGH (ref 0–44)
AST: 65 IU/L — ABNORMAL HIGH (ref 0–40)
Albumin: 3.9 g/dL (ref 3.8–4.8)
Alkaline Phosphatase: 57 IU/L (ref 44–121)
Bilirubin Total: 0.4 mg/dL (ref 0.0–1.2)
Bilirubin, Direct: 0.16 mg/dL (ref 0.00–0.40)
Total Protein: 6.2 g/dL (ref 6.0–8.5)

## 2020-12-18 LAB — LIPID PANEL
Chol/HDL Ratio: 2.4 ratio (ref 0.0–5.0)
Cholesterol, Total: 124 mg/dL (ref 100–199)
HDL: 51 mg/dL (ref >39)
LDL Chol Calc (NIH): 58 mg/dL (ref 0–99)
Triglycerides: 71 mg/dL (ref 0–149)
VLDL Cholesterol Cal: 15 mg/dL (ref 5–40)

## 2020-12-19 ENCOUNTER — Telehealth: Payer: Self-pay

## 2020-12-19 DIAGNOSIS — E782 Mixed hyperlipidemia: Secondary | ICD-10-CM

## 2020-12-19 DIAGNOSIS — R748 Abnormal levels of other serum enzymes: Secondary | ICD-10-CM

## 2020-12-19 NOTE — Telephone Encounter (Signed)
-----   Message from Wendall Stade, MD sent at 12/18/2020  4:41 PM EST ----- LDL at goal mild increase in LFTls f/u labs in 3 months

## 2020-12-19 NOTE — Telephone Encounter (Signed)
Patient aware of results.

## 2021-01-01 ENCOUNTER — Other Ambulatory Visit: Payer: Self-pay | Admitting: Student

## 2021-01-09 ENCOUNTER — Ambulatory Visit (INDEPENDENT_AMBULATORY_CARE_PROVIDER_SITE_OTHER): Payer: Managed Care, Other (non HMO)

## 2021-01-09 DIAGNOSIS — Z95810 Presence of automatic (implantable) cardiac defibrillator: Secondary | ICD-10-CM

## 2021-01-09 LAB — CUP PACEART REMOTE DEVICE CHECK
Battery Remaining Longevity: 39 mo
Battery Voltage: 2.97 V
Brady Statistic AP VP Percent: 23.14 %
Brady Statistic AP VS Percent: 0.37 %
Brady Statistic AS VP Percent: 75.29 %
Brady Statistic AS VS Percent: 1.2 %
Brady Statistic RA Percent Paced: 23.42 %
Brady Statistic RV Percent Paced: 24.28 %
Date Time Interrogation Session: 20221206043824
HighPow Impedance: 59 Ohm
Implantable Lead Implant Date: 20171206
Implantable Lead Implant Date: 20171206
Implantable Lead Implant Date: 20171206
Implantable Lead Location: 753858
Implantable Lead Location: 753859
Implantable Lead Location: 753860
Implantable Lead Model: 4598
Implantable Lead Model: 5076
Implantable Pulse Generator Implant Date: 20171206
Lead Channel Impedance Value: 174.595
Lead Channel Impedance Value: 178.5 Ohm
Lead Channel Impedance Value: 178.5 Ohm
Lead Channel Impedance Value: 194.634
Lead Channel Impedance Value: 199.5 Ohm
Lead Channel Impedance Value: 304 Ohm
Lead Channel Impedance Value: 323 Ohm
Lead Channel Impedance Value: 380 Ohm
Lead Channel Impedance Value: 380 Ohm
Lead Channel Impedance Value: 380 Ohm
Lead Channel Impedance Value: 399 Ohm
Lead Channel Impedance Value: 399 Ohm
Lead Channel Impedance Value: 437 Ohm
Lead Channel Impedance Value: 646 Ohm
Lead Channel Impedance Value: 646 Ohm
Lead Channel Impedance Value: 665 Ohm
Lead Channel Impedance Value: 665 Ohm
Lead Channel Impedance Value: 703 Ohm
Lead Channel Pacing Threshold Amplitude: 0.5 V
Lead Channel Pacing Threshold Amplitude: 1 V
Lead Channel Pacing Threshold Amplitude: 1.125 V
Lead Channel Pacing Threshold Pulse Width: 0.4 ms
Lead Channel Pacing Threshold Pulse Width: 0.4 ms
Lead Channel Pacing Threshold Pulse Width: 0.4 ms
Lead Channel Sensing Intrinsic Amplitude: 15.375 mV
Lead Channel Sensing Intrinsic Amplitude: 15.375 mV
Lead Channel Sensing Intrinsic Amplitude: 2.875 mV
Lead Channel Sensing Intrinsic Amplitude: 2.875 mV
Lead Channel Setting Pacing Amplitude: 1.75 V
Lead Channel Setting Pacing Amplitude: 2 V
Lead Channel Setting Pacing Amplitude: 2.5 V
Lead Channel Setting Pacing Pulse Width: 0.4 ms
Lead Channel Setting Pacing Pulse Width: 0.4 ms
Lead Channel Setting Sensing Sensitivity: 0.3 mV

## 2021-01-17 ENCOUNTER — Other Ambulatory Visit: Payer: Self-pay

## 2021-01-17 ENCOUNTER — Ambulatory Visit: Payer: Medicare Other | Admitting: Endocrinology

## 2021-01-17 VITALS — BP 150/92 | HR 65 | Ht 69.0 in | Wt 171.6 lb

## 2021-01-17 DIAGNOSIS — Z794 Long term (current) use of insulin: Secondary | ICD-10-CM

## 2021-01-17 DIAGNOSIS — E1122 Type 2 diabetes mellitus with diabetic chronic kidney disease: Secondary | ICD-10-CM | POA: Diagnosis not present

## 2021-01-17 DIAGNOSIS — N183 Chronic kidney disease, stage 3 unspecified: Secondary | ICD-10-CM

## 2021-01-17 DIAGNOSIS — E1165 Type 2 diabetes mellitus with hyperglycemia: Secondary | ICD-10-CM | POA: Diagnosis not present

## 2021-01-17 DIAGNOSIS — N189 Chronic kidney disease, unspecified: Secondary | ICD-10-CM

## 2021-01-17 DIAGNOSIS — E1051 Type 1 diabetes mellitus with diabetic peripheral angiopathy without gangrene: Secondary | ICD-10-CM

## 2021-01-17 LAB — POCT GLYCOSYLATED HEMOGLOBIN (HGB A1C): Hemoglobin A1C: 8.4 % — AB (ref 4.0–5.6)

## 2021-01-17 MED ORDER — LANTUS SOLOSTAR 100 UNIT/ML ~~LOC~~ SOPN: 34.0000 [IU] | PEN_INJECTOR | Freq: Every day | SUBCUTANEOUS | 99 refills | Status: DC

## 2021-01-17 MED ORDER — DAPAGLIFLOZIN PROPANEDIOL 5 MG PO TABS: 5.0000 mg | ORAL_TABLET | Freq: Every day | ORAL | 3 refills | Status: DC

## 2021-01-17 NOTE — Progress Notes (Signed)
Subjective:    Patient ID: John Carrillo, male    DOB: [birth year], 70 y.o.   MRN: [id number]  HPI Pt returns for f/u of diabetes mellitus:  DM type: Insulin-requiring type 2.   Dx'ed: 2008. Complications: CVA, PAD, CRI, and CAD.   Therapy: insulin since soon after dx, and Farxiga.  DKA: never.  Severe hypoglycemia: never.   Pancreatitis: never.   Other: he declines pump, including V-GO; he declines continuous glucose monitor; he takes multiple daily injections, but basal insulin is emphasized, after poor results with usual multiple daily injections regimen; he declines GLP; he has retired Interval history: He reports mild hypoglycemia approx QOD.  This happens mid-afternoon.  Pt says cbg varies from 60-180.   Past Medical History:  Diagnosis Date   Acute kidney injury superimposed on chronic kidney disease ([place]) 01/25/2015   AICD (automatic cardioverter/defibrillator) present    CHF (congestive heart failure) ([place])    CORONARY ARTERY DISEASE 08/11/2006   CVA 12/19/2006   2008   DIABETES MELLITUS, TYPE I 08/11/2006   ECZEMA, HANDS 12/15/2009   History of kidney stones    10+ years ago   HYPERLIPIDEMIA 08/11/2006   HYPERTENSION 08/11/2006   Hypothyroidism    Myocardial infarction [facility])    Presence of permanent cardiac pacemaker    Type 2 diabetes mellitus without complications ([place]) 62/69/4854    Past Surgical History:  Procedure Laterality Date   CARDIAC CATHETERIZATION N/A 01/16/2015   Procedure: Left Heart Cath and Coronary Angiography;  Surgeon: Lorretta Harp, MD;  Location: [place] [facility];  Service: Cardiovascular;  Laterality: N/A;   CORONARY STENT PLACEMENT     EP IMPLANTABLE DEVICE N/A 01/10/2016   MDT Claria MRI Quad CRTD implanted by Dr Rayann Heman   INGUINAL HERNIA REPAIR Left 05/22/2020   Procedure: LEFT INGUINAL HERNIA REPAIR WITH MESH;  Surgeon: Coralie Keens, MD;  Location: [place];  Service: General;  Laterality: Left;   INSERT / REPLACE / REMOVE PACEMAKER      INSERTION OF MESH Left 05/22/2020   Procedure: INSERTION OF MESH;  Surgeon: Coralie Keens, MD;  Location: [place];  Service: General;  Laterality: Left;   TEE WITHOUT CARDIOVERSION N/A 01/26/2015   Procedure: TRANSESOPHAGEAL ECHOCARDIOGRAM (TEE);  Surgeon: Sanda Klein, MD;  Location: [facility] ENDOSCOPY;  Service: Cardiovascular;  Laterality: N/A;   TONSILECTOMY, ADENOIDECTOMY, BILATERAL MYRINGOTOMY AND TUBES     TONSILLECTOMY     WISDOM TOOTH EXTRACTION     WRIST FRACTURE SURGERY Right 1960   3rd grade    Social History   Socioeconomic History   Marital status: Married    Spouse name: Not on file   Number of children: Not on file   Years of education: Not on file   Highest education level: Not on file  Occupational History   Occupation: Educational psychologist    Employer: [employer]  Tobacco Use   Smoking status: Former    Packs/day: 1.00    Years: 30.00    Pack years: 30.00    Types: Cigarettes    Quit date: 05/03/2000    Years since quitting: 20.7   Smokeless tobacco: Never  Vaping Use   Vaping Use: Never used  Substance and Sexual Activity   Alcohol use: Yes    Alcohol/week: 3.0 standard drinks    Types: 1 Glasses of wine, 1 Cans of beer, 1 Shots of liquor per week    Comment: 1-2/month   Drug use: No   Sexual activity: Not on file  Other Topics Concern   Not on file  Social History Narrative   Married (kids are patients here, wife seen elsewhere). 3 step children, 1 biological. No grandkids.    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   Oldest daughter Ledell Noss - college of [employer] officer- social work intl business minor religion- now trying to get foot in door at [employer]- wants to be in Merchandiser, retail for general dynamics- consider age 24 retirement      Hobbies: race sail boats, adrenaline related activities   Social Determinants of Radio broadcast assistant  Strain: Not on Art therapist Insecurity: Not on file  Transportation Needs: Not on file  Physical Activity: Not on file  Stress: Not on file  Social Connections: Not on file  Intimate Partner Violence: Not on file    Current Outpatient Medications on File Prior to Visit  Medication Sig Dispense Refill   acetaminophen (TYLENOL) 325 MG tablet Take 2 tablets (650 mg total) by mouth every 4 (four) hours as needed for headache or mild pain.     Blood Glucose Monitoring Suppl (ONETOUCH VERIO) w/Device KIT 1 Device by Does not apply route daily. 1 kit 0   carvedilol (COREG) 6.25 MG tablet TAKE 1 TABLET(6.25 MG) BY MOUTH TWICE DAILY 180 tablet 3   clopidogrel (PLAVIX) 75 MG tablet Take 1 tablet (75 mg total) by mouth daily. (Patient taking differently: Take 75 mg by mouth every evening.) 90 tablet 3   Coenzyme Q10 (COQ10) 200 MG CAPS Take 200 mg by mouth in the morning.     ezetimibe (ZETIA) 10 MG tablet TAKE 1 TABLET(10 MG) BY MOUTH DAILY 90 tablet 3   glucose blood (ONETOUCH VERIO) test strip 1 each by Other route 2 (two) times daily. And lancets 2/day 200 each 12   insulin lispro (HUMALOG KWIKPEN) 100 UNIT/ML KwikPen Inject 6-8 Units into the skin 3 (three) times daily with meals. And pen needles 4/day 30 mL 3   levothyroxine (SYNTHROID) 100 MCG tablet TAKE 1 TABLET(100 MCG) BY MOUTH DAILY 90 tablet 3   Probiotic Product (PROBIOTIC PO) Take 1 capsule by mouth every morning.     rosuvastatin (CRESTOR) 20 MG tablet Take 1 tablet (20 mg total) by mouth daily. (Patient taking differently: Take 20 mg by mouth every evening.) 90 tablet 3   sacubitril-valsartan (ENTRESTO) 49-51 MG Take 1 tablet by mouth in the morning and at bedtime. 180 tablet 3   tadalafil (CIALIS) 5 MG tablet Take 1 tablet (5 mg total) by mouth daily. Patient need an office visit with Dr. Yong Channel for further refills. (Patient taking differently: Take 5 mg by mouth at bedtime. Patient need an office visit with Dr. Yong Channel for further  refills.) 90 tablet 3   No current facility-administered medications on file prior to visit.    No Known Allergies  Family History  Problem Relation Age of Onset   CAD Father    Other Mother        passed right before 16- tumor on kidney recent diagnosis   Dementia Mother        progressing rapidly   CAD Paternal Grandfather        53s   CAD Paternal Grandmother        32s   Brain cancer Maternal Grandmother    Lymphoma Sister  b cell   Celiac disease Sister    Diabetes Neg Hx     BP (!) 150/92    Pulse 65    Ht 5' 9"  (1.753 m)    Wt 171 lb 9.6 oz (77.8 kg)    SpO2 97%    BMI 25.34 kg/m    Review of Systems     Objective:   Physical Exam    A1c=8.4%    Assessment & Plan:  Insulin-requiring type 2 DM: uncontrolled.  He again declines continuous glucose monitor and GLP Hypoglycemia, due to insulin: this limits aggressiveness of glycemic control.  Patient Instructions  Your blood pressure is high today.  Please see your primary care provider soon, to have it rechecked.   I have sent a prescription to your pharmacy, to increase the Lantus to 34 units daily.  However, take less novolog is exercise is upcoming.   check your blood sugar twice a day.  vary the time of day when you check, between before the 3 meals, and at bedtime.  also check if you have symptoms of your blood sugar being too high or too low.  please keep a record of the readings and bring it to your next appointment here (or you can bring the meter itself).  You can write it on any piece of paper.  please call us sooner if your blood sugar goes below 70, or if you have a lot of readings over 200.   Please come back for a follow-up appointment in 3-4 months.

## 2021-01-17 NOTE — Patient Instructions (Addendum)
Your blood pressure is high today.  Please see your primary care provider soon, to have it rechecked.   I have sent a prescription to your pharmacy, to increase the Lantus to 34 units daily.  However, take less novolog is exercise is upcoming.   check your blood sugar twice a day.  vary the time of day when you check, between before the 3 meals, and at bedtime.  also check if you have symptoms of your blood sugar being too high or too low.  please keep a record of the readings and bring it to your next appointment here (or you can bring the meter itself).  You can write it on any piece of paper.  please call us sooner if your blood sugar goes below 70, or if you have a lot of readings over 200.   Please come back for a follow-up appointment in 3-4 months.

## 2021-01-18 NOTE — Progress Notes (Signed)
Remote ICD transmission.   

## 2021-02-13 ENCOUNTER — Other Ambulatory Visit: Payer: Self-pay | Admitting: Cardiovascular Disease

## 2021-02-13 MED ORDER — ENTRESTO 49-51 MG PO TABS: 1.0000 | ORAL_TABLET | Freq: Two times a day (BID) | ORAL | 0 refills | Status: DC

## 2021-03-13 ENCOUNTER — Other Ambulatory Visit: Payer: Self-pay | Admitting: Family Medicine

## 2021-03-26 ENCOUNTER — Other Ambulatory Visit: Payer: Self-pay | Admitting: Student

## 2021-04-03 ENCOUNTER — Other Ambulatory Visit: Payer: Self-pay

## 2021-04-03 ENCOUNTER — Other Ambulatory Visit: Payer: Medicare Other | Admitting: *Deleted

## 2021-04-03 DIAGNOSIS — E782 Mixed hyperlipidemia: Secondary | ICD-10-CM

## 2021-04-03 DIAGNOSIS — R748 Abnormal levels of other serum enzymes: Secondary | ICD-10-CM

## 2021-04-03 LAB — LIPID PANEL
Chol/HDL Ratio: 2.3 ratio (ref 0.0–5.0)
Cholesterol, Total: 124 mg/dL (ref 100–199)
HDL: 53 mg/dL (ref >39)
LDL Chol Calc (NIH): 51 mg/dL (ref 0–99)
Triglycerides: 109 mg/dL (ref 0–149)
VLDL Cholesterol Cal: 20 mg/dL (ref 5–40)

## 2021-04-03 LAB — HEPATIC FUNCTION PANEL
ALT: 66 IU/L — ABNORMAL HIGH (ref 0–44)
AST: 58 IU/L — ABNORMAL HIGH (ref 0–40)
Albumin: 4 g/dL (ref 3.8–4.8)
Alkaline Phosphatase: 60 IU/L (ref 44–121)
Bilirubin Total: 0.3 mg/dL (ref 0.0–1.2)
Bilirubin, Direct: 0.13 mg/dL (ref 0.00–0.40)
Total Protein: 6.4 g/dL (ref 6.0–8.5)

## 2021-04-05 ENCOUNTER — Telehealth: Payer: Self-pay

## 2021-04-05 DIAGNOSIS — E785 Hyperlipidemia, unspecified: Secondary | ICD-10-CM

## 2021-04-05 DIAGNOSIS — E782 Mixed hyperlipidemia: Secondary | ICD-10-CM

## 2021-04-05 MED ORDER — ROSUVASTATIN CALCIUM 10 MG PO TABS: 10.0000 mg | ORAL_TABLET | Freq: Every day | ORAL | 3 refills | Status: DC

## 2021-04-05 NOTE — Telephone Encounter (Signed)
-----   Message from Wendall Stade, MD sent at 04/04/2021  7:42 AM EST ----- ?LFTs remain mildly elevated decrease statin to 10 mg and repeat in 6 months Is he drinking ETOH ?   ?

## 2021-04-05 NOTE — Telephone Encounter (Signed)
The patient has been notified of the result and verbalized understanding.  All questions (if any) were answered. ?Ethelda Chick, RN 04/05/2021 12:54 PM  ? ?Patient will decrease Crestor to 10 mg daily. Patient coming back in September for fasting lab work. Patient stated he rarely has any alcohol, at the most 2 glasses of wine a month. Will forward to Dr. [name], so he is aware. ?

## 2021-04-10 ENCOUNTER — Ambulatory Visit (INDEPENDENT_AMBULATORY_CARE_PROVIDER_SITE_OTHER): Payer: Managed Care, Other (non HMO)

## 2021-04-10 DIAGNOSIS — I255 Ischemic cardiomyopathy: Secondary | ICD-10-CM

## 2021-04-10 LAB — CUP PACEART REMOTE DEVICE CHECK
Battery Remaining Longevity: 36 mo
Battery Voltage: 2.96 V
Brady Statistic AP VP Percent: 29.08 %
Brady Statistic AP VS Percent: 0.39 %
Brady Statistic AS VP Percent: 69.34 %
Brady Statistic AS VS Percent: 1.19 %
Brady Statistic RA Percent Paced: 29.21 %
Brady Statistic RV Percent Paced: 32.28 %
Date Time Interrogation Session: 20230307012203
HighPow Impedance: 68 Ohm
Implantable Lead Implant Date: 20171206
Implantable Lead Implant Date: 20171206
Implantable Lead Implant Date: 20171206
Implantable Lead Location: 753858
Implantable Lead Location: 753859
Implantable Lead Location: 753860
Implantable Lead Model: 4598
Implantable Lead Model: 5076
Implantable Pulse Generator Implant Date: 20171206
Lead Channel Impedance Value: 178.5 Ohm
Lead Channel Impedance Value: 178.5 Ohm
Lead Channel Impedance Value: 185.725
Lead Channel Impedance Value: 208.568
Lead Channel Impedance Value: 208.568
Lead Channel Impedance Value: 304 Ohm
Lead Channel Impedance Value: 323 Ohm
Lead Channel Impedance Value: 399 Ohm
Lead Channel Impedance Value: 399 Ohm
Lead Channel Impedance Value: 399 Ohm
Lead Channel Impedance Value: 399 Ohm
Lead Channel Impedance Value: 437 Ohm
Lead Channel Impedance Value: 437 Ohm
Lead Channel Impedance Value: 627 Ohm
Lead Channel Impedance Value: 646 Ohm
Lead Channel Impedance Value: 646 Ohm
Lead Channel Impedance Value: 703 Ohm
Lead Channel Impedance Value: 722 Ohm
Lead Channel Pacing Threshold Amplitude: 0.5 V
Lead Channel Pacing Threshold Amplitude: 0.875 V
Lead Channel Pacing Threshold Amplitude: 1.125 V
Lead Channel Pacing Threshold Pulse Width: 0.4 ms
Lead Channel Pacing Threshold Pulse Width: 0.4 ms
Lead Channel Pacing Threshold Pulse Width: 0.4 ms
Lead Channel Sensing Intrinsic Amplitude: 15.375 mV
Lead Channel Sensing Intrinsic Amplitude: 15.375 mV
Lead Channel Sensing Intrinsic Amplitude: 3.125 mV
Lead Channel Sensing Intrinsic Amplitude: 3.125 mV
Lead Channel Setting Pacing Amplitude: 1.75 V
Lead Channel Setting Pacing Amplitude: 2 V
Lead Channel Setting Pacing Amplitude: 2.5 V
Lead Channel Setting Pacing Pulse Width: 0.4 ms
Lead Channel Setting Pacing Pulse Width: 0.4 ms
Lead Channel Setting Sensing Sensitivity: 0.3 mV

## 2021-04-11 ENCOUNTER — Other Ambulatory Visit: Payer: Self-pay | Admitting: Student

## 2021-04-11 ENCOUNTER — Other Ambulatory Visit: Payer: Self-pay | Admitting: Internal Medicine

## 2021-04-11 NOTE — Progress Notes (Incomplete)
CARDIOLOGY CONSULT NOTE       Patient ID: John Carrillo MRN: [id number] DOB/AGE: [birth year] 70 y.o.  Primary Physician: Marin Olp, MD Primary Cardiologist: Johnsie Cancel Reason for F/U:  Ischemic DCM/AICD     HPI:   John Carrillo is a 71 y.o. male with a hx of CAD status post anterior MI 18 years ago treated with stenting of the LAD, ICM, LBBB, prior CVA, HL, DM 2.  He was evaluated by Dr. Virl Axe in 2008 for ICD implantation for primary prevention.  He declined at that time.  Had SEMI 2016 with RCA intervention EF 15% Readmitted 01/23/17 with right occipital lobe CVA placed on Brillinta and Eliquis. Eventually agreed to MDT CRTD device implant.  PACE ART review 04/10/21 normal no arrhythmia or shocks Medtronic device LRL 60 bpm  Last TTE done 08/15/17 EF 30-35% Anterior infarct Aortic root 40 mm no valve dx Normal RV  Tolerating mid dose Entresto On crestor 10 mg LDL 51 04/03/21   Had left inguinal hernia repair with Dr Rush Farmer on 05/22/20   Daughter [name] is also a Paramedic at [employer] oldest daughter is in [place] working for [employer] and traveling to [place]  He enjoys Administrator and will retire soon   ROS All other systems reviewed and negative except as noted above  Past Medical History:  Diagnosis Date   Acute kidney injury superimposed on chronic kidney disease ([place]) 01/25/2015   AICD (automatic cardioverter/defibrillator) present    CHF (congestive heart failure) ([place])    CORONARY ARTERY DISEASE 08/11/2006   CVA 12/19/2006   2008   DIABETES MELLITUS, TYPE I 08/11/2006   ECZEMA, HANDS 12/15/2009   History of kidney stones    10+ years ago   HYPERLIPIDEMIA 08/11/2006   HYPERTENSION 08/11/2006   Hypothyroidism    Myocardial infarction [facility])    Presence of permanent cardiac pacemaker    Type 2 diabetes mellitus without complications ([place]) 49/20/1007    Family History  Problem Relation Age of Onset   CAD Father     Other Mother        passed right before 61- tumor on kidney recent diagnosis   Dementia Mother        progressing rapidly   CAD Paternal Grandfather        15s   CAD Paternal Grandmother        86s   Brain cancer Maternal Grandmother    Lymphoma Sister        b cell   Celiac disease Sister    Diabetes Neg Hx     Social History   Socioeconomic History   Marital status: Married    Spouse name: Not on file   Number of children: Not on file   Years of education: Not on file   Highest education level: Not on file  Occupational History   Occupation: Software engineer: [employer]  Tobacco Use   Smoking status: Former    Packs/day: 1.00    Years: 30.00    Pack years: 30.00    Types: Cigarettes    Quit date: 05/03/2000    Years since quitting: 20.9   Smokeless tobacco: Never  Vaping Use   Vaping Use: Never used  Substance and Sexual Activity   Alcohol use: Yes    Alcohol/week: 3.0 standard drinks    Types: 1 Glasses of wine, 1 Cans of beer, 1 Shots of liquor per week  Comment: 1-2/month   Drug use: No   Sexual activity: Not on file  Other Topics Concern   Not on file  Social History Narrative   Married (kids are patients here, wife seen elsewhere). 3 step children, 1 biological. No grandkids.    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[employer]) but also wants to do grad school   Oldest daughter Ledell Noss - college of Nature conservation officer- social work intl business minor religion- now trying to get foot in door at [employer]- wants to be in Merchandiser, retail for [employer]- consider age 3 retirement      Hobbies: race sail boats, adrenaline related activities   Social Determinants of Radio broadcast assistant Strain: Not on file  Food Insecurity: Not on file  Transportation Needs: Not on file  Physical Activity: Not on file  Stress: Not on file  Social Connections: Not on file   Intimate Partner Violence: Not on file    Past Surgical History:  Procedure Laterality Date   CARDIAC CATHETERIZATION N/A 01/16/2015   Procedure: Left Heart Cath and Coronary Angiography;  Surgeon: Lorretta Harp, MD;  Location: [place] [facility];  Service: Cardiovascular;  Laterality: N/A;   CORONARY STENT PLACEMENT     EP IMPLANTABLE DEVICE N/A 01/10/2016   MDT Claria MRI Quad CRTD implanted by Dr Rayann Heman   INGUINAL HERNIA REPAIR Left 05/22/2020   Procedure: LEFT INGUINAL HERNIA REPAIR WITH MESH;  Surgeon: Coralie Keens, MD;  Location: [place];  Service: General;  Laterality: Left;   INSERT / REPLACE / REMOVE PACEMAKER     INSERTION OF MESH Left 05/22/2020   Procedure: INSERTION OF MESH;  Surgeon: Coralie Keens, MD;  Location: [place];  Service: General;  Laterality: Left;   TEE WITHOUT CARDIOVERSION N/A 01/26/2015   Procedure: TRANSESOPHAGEAL ECHOCARDIOGRAM (TEE);  Surgeon: Sanda Klein, MD;  Location: [facility];  Service: Cardiovascular;  Laterality: N/A;   TONSILECTOMY, ADENOIDECTOMY, BILATERAL MYRINGOTOMY AND TUBES     TONSILLECTOMY     WISDOM TOOTH EXTRACTION     WRIST FRACTURE SURGERY Right 1960   3rd grade      Current Outpatient Medications:    acetaminophen (TYLENOL) 325 MG tablet, Take 2 tablets (650 mg total) by mouth every 4 (four) hours as needed for headache or mild pain., Disp: , Rfl:    Blood Glucose Monitoring Suppl (ONETOUCH VERIO) w/Device KIT, 1 Device by Does not apply route daily., Disp: 1 kit, Rfl: 0   carvedilol (COREG) 6.25 MG tablet, TAKE 1 TABLET(6.25 MG) BY MOUTH TWICE DAILY, Disp: 180 tablet, Rfl: 3   clopidogrel (PLAVIX) 75 MG tablet, TAKE 1 TABLET(75 MG) BY MOUTH DAILY, Disp: 90 tablet, Rfl: 2   Coenzyme Q10 (COQ10) 200 MG CAPS, Take 200 mg by mouth in the morning., Disp: , Rfl:    dapagliflozin propanediol (FARXIGA) 5 MG TABS tablet, Take 1 tablet (5 mg total) by mouth daily., Disp: 90 tablet, Rfl: 3   ezetimibe (ZETIA) 10  MG tablet, TAKE 1 TABLET(10 MG) BY MOUTH DAILY, Disp: 90 tablet, Rfl: 3   glucose blood (ONETOUCH VERIO) test strip, 1 each by Other route 2 (two) times daily. And lancets 2/day, Disp: 200 each, Rfl: 12   insulin glargine (LANTUS SOLOSTAR) 100 UNIT/ML Solostar Pen, Inject 34 Units into the skin daily., Disp: 30 mL, Rfl: PRN   insulin lispro (HUMALOG KWIKPEN) 100 UNIT/ML KwikPen, Inject 6-8 Units into the  skin 3 (three) times daily with meals. And pen needles 4/day, Disp: 30 mL, Rfl: 3   levothyroxine (SYNTHROID) 100 MCG tablet, TAKE 1 TABLET(100 MCG) BY MOUTH DAILY, Disp: 90 tablet, Rfl: 3   Probiotic Product (PROBIOTIC PO), Take 1 capsule by mouth every morning., Disp: , Rfl:    rosuvastatin (CRESTOR) 10 MG tablet, Take 1 tablet (10 mg total) by mouth daily., Disp: 90 tablet, Rfl: 3   sacubitril-valsartan (ENTRESTO) 49-51 MG, Take 1 tablet by mouth in the morning and at bedtime. Please keep upcoming appt in March 2023 with Dr. Johnsie Cancel before anymore refills. Thank you, Disp: 180 tablet, Rfl: 0   tadalafil (CIALIS) 5 MG tablet, TAKE 1 TABLET(5 MG) BY MOUTH DAILY, Disp: 90 tablet, Rfl: 3    Physical Exam: There were no vitals taken for this visit.    Affect appropriate Healthy:  appears stated age 38: normal Neck supple with no adenopathy JVP normal no bruits no thyromegaly Lungs clear with no wheezing and good diaphragmatic motion Heart:  S1/S2 no murmur,enlarged PMI AICD under left clavicle  Abdomen: benighn, post left inguinal hernia repair with mesh  Distal pulses intact with no bruits No edema Neuro non-focal Skin warm and dry No muscular weakness   Labs:   Lab Results  Component Value Date   WBC 7.7 11/17/2020   HGB 15.5 11/17/2020   HCT 44.8 11/17/2020   MCV 98 (H) 11/17/2020   PLT 177 11/17/2020   No results for input(s): NA, K, CL, CO2, BUN, CREATININE, CALCIUM, PROT, BILITOT, ALKPHOS, ALT, AST, GLUCOSE in the last 168 hours.  Invalid input(s): LABALBU Lab  Results  Component Value Date   CKTOTAL 192 12/14/2006   CKMB 2.0 12/14/2006   TROPONINI 0.13 (H) 01/15/2015    Lab Results  Component Value Date   CHOL 124 04/03/2021   CHOL 124 12/18/2020   CHOL 123 06/16/2020   Lab Results  Component Value Date   HDL 53 04/03/2021   HDL 51 12/18/2020   HDL 51 06/16/2020   Lab Results  Component Value Date   LDLCALC 51 04/03/2021   LDLCALC 58 12/18/2020   LDLCALC 56 06/16/2020   Lab Results  Component Value Date   TRIG 109 04/03/2021   TRIG 71 12/18/2020   TRIG 81 06/16/2020   Lab Results  Component Value Date   CHOLHDL 2.3 04/03/2021   CHOLHDL 2.4 12/18/2020   CHOLHDL 2.4 06/16/2020   Lab Results  Component Value Date   LDLDIRECT 85.0 07/11/2017   LDLDIRECT 90.0 02/17/2015   LDLDIRECT 100.0 12/23/2014      Radiology: CUP PACEART REMOTE DEVICE CHECK  Result Date: 04/10/2021 Scheduled remote reviewed. Normal device function.  Next remote 91 days- JJB   EKG: P synch pacing QRS 128 msec    ASSESSMENT AND PLAN:  CHF:  EF 30-35% functional class one On good GDMT update echo  CAD:  Post large anterior MI with stenting of LAD 19 years ago and SEMI stenting ?RCA in 2016 no angina continue medical Rx CRT-AICD:  F/u EP Implant 2017 no d/c normal PaceArt review  04/10/21 with no PAF  HLD:  LDL at goal on crestor  DM:  Discussed low carb diet.  Target hemoglobin A1c is 6.5 or less.  Continue current medications. Last  Thyroid:  On replacement with synthroid  Hernia:  post left inguinal repair improved   Echo for ischemic DCM F/U with EP 6 months and me in a year   Signed: Jenkins Rouge 04/11/2021,  12:07 PM

## 2021-04-16 ENCOUNTER — Ambulatory Visit: Payer: Medicare Other | Admitting: Endocrinology

## 2021-04-16 ENCOUNTER — Other Ambulatory Visit: Payer: Self-pay

## 2021-04-16 VITALS — BP 124/76 | HR 74 | Ht 69.0 in | Wt 178.2 lb

## 2021-04-16 DIAGNOSIS — Z794 Long term (current) use of insulin: Secondary | ICD-10-CM

## 2021-04-16 DIAGNOSIS — N183 Chronic kidney disease, stage 3 unspecified: Secondary | ICD-10-CM | POA: Diagnosis not present

## 2021-04-16 DIAGNOSIS — E1122 Type 2 diabetes mellitus with diabetic chronic kidney disease: Secondary | ICD-10-CM | POA: Diagnosis not present

## 2021-04-16 LAB — POCT GLYCOSYLATED HEMOGLOBIN (HGB A1C): Hemoglobin A1C: 7.9 % — AB (ref 4.0–5.6)

## 2021-04-16 MED ORDER — INSULIN LISPRO (1 UNIT DIAL) 100 UNIT/ML (KWIKPEN): PEN_INJECTOR | SUBCUTANEOUS | 3 refills | Status: DC

## 2021-04-16 MED ORDER — LANTUS SOLOSTAR 100 UNIT/ML ~~LOC~~ SOPN: 35.0000 [IU] | PEN_INJECTOR | Freq: Every day | SUBCUTANEOUS | 99 refills | Status: DC

## 2021-04-16 NOTE — Patient Instructions (Addendum)
I have sent a prescription to your pharmacy, to increase the Lantus to 35 units daily, and:  ?Change the Humalog to 3 times a day (just before each meal), 7-3-7 units.  However, take less Humalog is exercise is upcoming.   ?check your blood sugar twice a day.  vary the time of day when you check, between before the 3 meals, and at bedtime.  also check if you have symptoms of your blood sugar being too high or too low.  please keep a record of the readings and bring it to your next appointment here (or you can bring the meter itself).  You can write it on any piece of paper.  please call us sooner if your blood sugar goes below 70, or if you have a lot of readings over 200.   ?Please come back for a follow-up appointment in 3-4 months.   ?

## 2021-04-16 NOTE — Progress Notes (Signed)
? ?Subjective:  ? ? Patient ID: John Carrillo, male    DOB: 15-Aug-1950, 71 y.o.   MRN: [id number] ? ?HPI ?Pt returns for f/u of diabetes mellitus:  ?DM type: Insulin-requiring type 2.   ?Dx'ed: 2008. ?Complications: CVA, PAD, CRI, and CAD.   ?Therapy: insulin since soon after dx, and Farxiga.  ?DKA: never.  ?Severe hypoglycemia: never.   ?Pancreatitis: never.   ?Other: he declines pump, including V-GO; he declines continuous glucose monitor; he takes multiple daily injections, but basal insulin is emphasized, after poor results with usual multiple daily injections regimen; he declines GLP; he has retired.   ?Interval history: He reports mild hypoglycemia approx BIW.  This happens mid-afternoon, despite decreasing lunch Huamlog to 4 units.  no cbg record, but states cbg's are highest fasting.   ?Past Medical History:  ?Diagnosis Date  ? Acute kidney injury superimposed on chronic kidney disease ([place]) 01/25/2015  ? AICD (automatic cardioverter/defibrillator) present   ? CHF (congestive heart failure) ([place])   ? CORONARY ARTERY DISEASE 08/11/2006  ? CVA 12/19/2006  ? 2008  ? DIABETES MELLITUS, TYPE I 08/11/2006  ? ECZEMA, HANDS 12/15/2009  ? History of kidney stones   ? 10+ years ago  ? HYPERLIPIDEMIA 08/11/2006  ? HYPERTENSION 08/11/2006  ? Hypothyroidism   ? Myocardial infarction [facility])   ? Presence of permanent cardiac pacemaker   ? Type 2 diabetes mellitus without complications ([place]) 56/21/3086  ? ? ?Past Surgical History:  ?Procedure Laterality Date  ? CARDIAC CATHETERIZATION N/A 01/16/2015  ? Procedure: Left Heart Cath and Coronary Angiography;  Surgeon: Lorretta Harp, MD;  Location: [place] [facility];  Service: Cardiovascular;  Laterality: N/A;  ? CORONARY STENT PLACEMENT    ? EP IMPLANTABLE DEVICE N/A 01/10/2016  ? MDT Claria MRI Quad CRTD implanted by Dr Rayann Heman  ? INGUINAL HERNIA REPAIR Left 05/22/2020  ? Procedure: LEFT INGUINAL HERNIA REPAIR WITH MESH;  Surgeon: Coralie Keens, MD;  Location: [place];  Service:  General;  Laterality: Left;  ? INSERT / REPLACE / REMOVE PACEMAKER    ? INSERTION OF MESH Left 05/22/2020  ? Procedure: INSERTION OF MESH;  Surgeon: Coralie Keens, MD;  Location: [place];  Service: General;  Laterality: Left;  ? TEE WITHOUT CARDIOVERSION N/A 01/26/2015  ? Procedure: TRANSESOPHAGEAL ECHOCARDIOGRAM (TEE);  Surgeon: Sanda Klein, MD;  Location: [facility];  Service: Cardiovascular;  Laterality: N/A;  ? TONSILECTOMY, ADENOIDECTOMY, BILATERAL MYRINGOTOMY AND TUBES    ? TONSILLECTOMY    ? WISDOM TOOTH EXTRACTION    ? WRIST FRACTURE SURGERY Right 1960  ? 3rd grade  ? ? ?Social History  ? ?Socioeconomic History  ? Marital status: Married  ?  Spouse name: Not on file  ? Number of children: Not on file  ? Years of education: Not on file  ? Highest education level: Not on file  ?Occupational History  ? Occupation: Educational psychologist  ?  Employer: [employer]  ?Tobacco Use  ? Smoking status: Former  ?  Packs/day: 1.00  ?  Years: 30.00  ?  Pack years: 30.00  ?  Types: Cigarettes  ?  Quit date: 05/03/2000  ?  Years since quitting: 20.9  ? Smokeless tobacco: Never  ?Vaping Use  ? Vaping Use: Never used  ?Substance and Sexual Activity  ? Alcohol use: Yes  ?  Alcohol/week: 3.0 standard drinks  ?  Types: 1 Glasses of wine, 1 Cans of beer, 1 Shots of liquor per week  ?  Comment: 1-2/month  ?  Drug use: No  ? Sexual activity: Not on file  ?Other Topics Concern  ? Not on file  ?Social History Narrative  ? Married (kids are patients here, wife seen elsewhere). 3 step children, 1 biological. No grandkids.   ? In late 2018  ? 29 yo senior at [employer], [employer], [employer] ([employer]) but also wants to do grad school  ? Oldest daughter Ledell Noss - college of [employer] work intl business minor religion- now trying to get foot in door at [employer]- wants to be in corporate  ?   ? 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   ? Hobbies: race sail boats, adrenaline related  activities  ? ?Social Determinants of Health  ? ?Financial Resource Strain: Not on file  ?Food Insecurity: Not on file  ?Transportation Needs: Not on file  ?Physical Activity: Not on file  ?Stress: Not on file  ?Social Connections: Not on file  ?Intimate Partner Violence: Not on file  ? ? ?Current Outpatient Medications on File Prior to Visit  ?Medication Sig Dispense Refill  ? acetaminophen (TYLENOL) 325 MG tablet Take 2 tablets (650 mg total) by mouth every 4 (four) hours as needed for headache or mild pain.    ? Blood Glucose Monitoring Suppl (ONETOUCH VERIO) w/Device KIT 1 Device by Does not apply route daily. 1 kit 0  ? carvedilol (COREG) 6.25 MG tablet TAKE 1 TABLET(6.25 MG) BY MOUTH TWICE DAILY 180 tablet 3  ? clopidogrel (PLAVIX) 75 MG tablet TAKE 1 TABLET(75 MG) BY MOUTH DAILY 90 tablet 2  ? Coenzyme Q10 (COQ10) 200 MG CAPS Take 200 mg by mouth in the morning.    ? dapagliflozin propanediol (FARXIGA) 5 MG TABS tablet Take 1 tablet (5 mg total) by mouth daily. 90 tablet 3  ? ezetimibe (ZETIA) 10 MG tablet TAKE 1 TABLET(10 MG) BY MOUTH DAILY 90 tablet 3  ? glucose blood (ONETOUCH VERIO) test strip 1 each by Other route 2 (two) times daily. And lancets 2/day 200 each 12  ? levothyroxine (SYNTHROID) 100 MCG tablet TAKE 1 TABLET(100 MCG) BY MOUTH DAILY 90 tablet 3  ? Probiotic Product (PROBIOTIC PO) Take 1 capsule by mouth every morning.    ? rosuvastatin (CRESTOR) 10 MG tablet Take 1 tablet (10 mg total) by mouth daily. 90 tablet 3  ? sacubitril-valsartan (ENTRESTO) 49-51 MG Take 1 tablet by mouth in the morning and at bedtime. Please keep upcoming appt in March 2023 with Dr. Johnsie Cancel before anymore refills. Thank you 180 tablet 0  ? tadalafil (CIALIS) 5 MG tablet TAKE 1 TABLET(5 MG) BY MOUTH DAILY 90 tablet 3  ? ?No current facility-administered medications on file prior to visit.  ? ? ?No Known Allergies ? ?Family History  ?Problem Relation Age of Onset  ? CAD Father   ? Other Mother   ?     passed right before  73- tumor on kidney recent diagnosis  ? Dementia Mother   ?     progressing rapidly  ? CAD Paternal Grandfather   ?     11s  ? CAD Paternal 69   ?     73s  ? Brain cancer Maternal Grandmother   ? Lymphoma Sister   ?     b cell  ? Celiac disease Sister   ? Diabetes Neg Hx   ? ? ?BP 124/76 (BP Location: Left Arm, Patient Position: Sitting, Cuff Size: Normal)   Pulse 74   Ht  5' 9"  (1.753 m)   Wt 178 lb 3.2 oz (80.8 kg)   SpO2 96%   BMI 26.32 kg/m?  ? ? ?Review of Systems ? ?   ?Objective:  ? Physical Exam ? ? ? ?Lab Results  ?Component Value Date  ? HGBA1C 7.9 (A) 04/16/2021  ? ?   ?Assessment & Plan:  ?Insulin-requiring type 2 DM: uncontrolled.   ?Hypoglycemia, due to insulin: The pattern of his cbg's indicates he needs some adjustment in his therapy.   ? ?Patient Instructions  ?I have sent a prescription to your pharmacy, to increase the Lantus to 35 units daily, and:  ?Change the Humalog to 3 times a day (just before each meal), 7-3-7 units.  However, take less Humalog is exercise is upcoming.   ?check your blood sugar twice a day.  vary the time of day when you check, between before the 3 meals, and at bedtime.  also check if you have symptoms of your blood sugar being too high or too low.  please keep a record of the readings and bring it to your next appointment here (or you can bring the meter itself).  You can write it on any piece of paper.  please call us sooner if your blood sugar goes below 70, or if you have a lot of readings over 200.   ?Please come back for a follow-up appointment in 3-4 months.   ? ?

## 2021-04-24 NOTE — Progress Notes (Signed)
Remote ICD transmission.   

## 2021-04-25 ENCOUNTER — Ambulatory Visit: Payer: Medicare Other | Admitting: Cardiovascular Disease

## 2021-04-25 ENCOUNTER — Encounter: Payer: Self-pay | Admitting: Cardiovascular Disease

## 2021-04-25 ENCOUNTER — Other Ambulatory Visit: Payer: Self-pay

## 2021-04-25 VITALS — BP 110/86 | HR 62 | Ht 69.0 in | Wt 176.0 lb

## 2021-04-25 DIAGNOSIS — I255 Ischemic cardiomyopathy: Secondary | ICD-10-CM

## 2021-04-25 DIAGNOSIS — I2510 Atherosclerotic heart disease of native coronary artery without angina pectoris: Secondary | ICD-10-CM | POA: Diagnosis not present

## 2021-04-25 DIAGNOSIS — E785 Hyperlipidemia, unspecified: Secondary | ICD-10-CM

## 2021-04-25 DIAGNOSIS — I10 Essential (primary) hypertension: Secondary | ICD-10-CM

## 2021-04-25 DIAGNOSIS — I5020 Unspecified systolic (congestive) heart failure: Secondary | ICD-10-CM | POA: Diagnosis not present

## 2021-04-25 NOTE — Patient Instructions (Signed)
Medication Instructions:  ?*If you need a refill on your cardiac medications before your next appointment, please call your pharmacy* ? ?Lab Work: ?If you have labs (blood work) drawn today and your tests are completely normal, you will receive your results only by: ?MyChart Message (if you have MyChart) OR ?A paper copy in the mail ?If you have any lab test that is abnormal or we need to change your treatment, we will call you to review the results. ? ?Follow-Up: ?At [facility], you and your health needs are our priority.  As part of our continuing mission to provide you with exceptional heart care, we have created designated Provider Care Teams.  These Care Teams include your primary Cardiologist (physician) and Advanced Practice Providers (APPs -  Physician Assistants and Nurse Practitioners) who all work together to provide you with the care you need, when you need it. ? ?We recommend signing up for the patient portal called "MyChart".  Sign up information is provided on this After Visit Summary.  MyChart is used to connect with patients for Virtual Visits (Telemedicine).  Patients are able to view lab/test results, encounter notes, upcoming appointments, etc.  Non-urgent messages can be sent to your provider as well.   ?To learn more about what you can do with MyChart, go to [email or web address].   ? ?Your next appointment:   ?1 year(s) ? ?The format for your next appointment:   ?In Person ? ?Provider:   ?Peter Nishan, MD { ? ?

## 2021-05-28 ENCOUNTER — Other Ambulatory Visit: Payer: Self-pay | Admitting: Cardiovascular Disease

## 2021-07-10 ENCOUNTER — Ambulatory Visit (INDEPENDENT_AMBULATORY_CARE_PROVIDER_SITE_OTHER): Payer: Managed Care, Other (non HMO)

## 2021-07-10 DIAGNOSIS — I255 Ischemic cardiomyopathy: Secondary | ICD-10-CM

## 2021-07-11 LAB — CUP PACEART REMOTE DEVICE CHECK
Battery Remaining Longevity: 32 mo
Battery Voltage: 2.95 V
Brady Statistic AP VP Percent: 28.28 %
Brady Statistic AP VS Percent: 0.43 %
Brady Statistic AS VP Percent: 70.18 %
Brady Statistic AS VS Percent: 1.11 %
Brady Statistic RA Percent Paced: 28.62 %
Brady Statistic RV Percent Paced: 31.35 %
Date Time Interrogation Session: 20230607114212
HighPow Impedance: 66 Ohm
Implantable Lead Implant Date: 20171206
Implantable Lead Implant Date: 20171206
Implantable Lead Implant Date: 20171206
Implantable Lead Location: 753858
Implantable Lead Location: 753859
Implantable Lead Location: 753860
Implantable Lead Model: 4598
Implantable Lead Model: 5076
Implantable Pulse Generator Implant Date: 20171206
Lead Channel Impedance Value: 194.634
Lead Channel Impedance Value: 203.256
Lead Channel Impedance Value: 203.256
Lead Channel Impedance Value: 208.568
Lead Channel Impedance Value: 218.5 Ohm
Lead Channel Impedance Value: 323 Ohm
Lead Channel Impedance Value: 380 Ohm
Lead Channel Impedance Value: 399 Ohm
Lead Channel Impedance Value: 437 Ohm
Lead Channel Impedance Value: 437 Ohm
Lead Channel Impedance Value: 437 Ohm
Lead Channel Impedance Value: 437 Ohm
Lead Channel Impedance Value: 456 Ohm
Lead Channel Impedance Value: 665 Ohm
Lead Channel Impedance Value: 665 Ohm
Lead Channel Impedance Value: 703 Ohm
Lead Channel Impedance Value: 722 Ohm
Lead Channel Impedance Value: 760 Ohm
Lead Channel Pacing Threshold Amplitude: 0.375 V
Lead Channel Pacing Threshold Amplitude: 1 V
Lead Channel Pacing Threshold Amplitude: 1.25 V
Lead Channel Pacing Threshold Pulse Width: 0.4 ms
Lead Channel Pacing Threshold Pulse Width: 0.4 ms
Lead Channel Pacing Threshold Pulse Width: 0.4 ms
Lead Channel Sensing Intrinsic Amplitude: 11.75 mV
Lead Channel Sensing Intrinsic Amplitude: 11.75 mV
Lead Channel Sensing Intrinsic Amplitude: 3.375 mV
Lead Channel Sensing Intrinsic Amplitude: 3.375 mV
Lead Channel Setting Pacing Amplitude: 1.75 V
Lead Channel Setting Pacing Amplitude: 2 V
Lead Channel Setting Pacing Amplitude: 2.5 V
Lead Channel Setting Pacing Pulse Width: 0.4 ms
Lead Channel Setting Pacing Pulse Width: 0.4 ms
Lead Channel Setting Sensing Sensitivity: 0.3 mV

## 2021-07-13 ENCOUNTER — Other Ambulatory Visit (INDEPENDENT_AMBULATORY_CARE_PROVIDER_SITE_OTHER): Payer: Medicare Other

## 2021-07-13 ENCOUNTER — Other Ambulatory Visit: Payer: Self-pay | Admitting: Endocrinology

## 2021-07-13 DIAGNOSIS — E1051 Type 1 diabetes mellitus with diabetic peripheral angiopathy without gangrene: Secondary | ICD-10-CM | POA: Diagnosis not present

## 2021-07-13 DIAGNOSIS — E039 Hypothyroidism, unspecified: Secondary | ICD-10-CM

## 2021-07-13 LAB — COMPREHENSIVE METABOLIC PANEL
ALT: 42 U/L (ref 0–53)
AST: 34 U/L (ref 0–37)
Albumin: 3.9 g/dL (ref 3.5–5.2)
Alkaline Phosphatase: 48 U/L (ref 39–117)
BUN: 22 mg/dL (ref 6–23)
CO2: 31 mEq/L (ref 19–32)
Calcium: 9.5 mg/dL (ref 8.4–10.5)
Chloride: 106 mEq/L (ref 96–112)
Creatinine, Ser: 1.32 mg/dL (ref 0.40–1.50)
GFR: 54.9 mL/min — ABNORMAL LOW (ref >60.00)
Glucose, Bld: 79 mg/dL (ref 70–99)
Potassium: 4.2 mEq/L (ref 3.5–5.1)
Sodium: 142 mEq/L (ref 135–145)
Total Bilirubin: 0.5 mg/dL (ref 0.2–1.2)
Total Protein: 6.7 g/dL (ref 6.0–8.3)

## 2021-07-13 LAB — MICROALBUMIN / CREATININE URINE RATIO
Creatinine,U: 136.2 mg/dL
Microalb Creat Ratio: 1.2 mg/g (ref 0.0–30.0)
Microalb, Ur: 1.6 mg/dL (ref 0.0–1.9)

## 2021-07-13 LAB — HEMOGLOBIN A1C: Hgb A1c MFr Bld: 7.9 % — ABNORMAL HIGH (ref 4.6–6.5)

## 2021-07-13 LAB — TSH: TSH: 7.62 u[IU]/mL — ABNORMAL HIGH (ref 0.35–5.50)

## 2021-07-16 ENCOUNTER — Encounter: Payer: Self-pay | Admitting: Endocrinology

## 2021-07-16 ENCOUNTER — Other Ambulatory Visit: Payer: Self-pay

## 2021-07-16 DIAGNOSIS — E1051 Type 1 diabetes mellitus with diabetic peripheral angiopathy without gangrene: Secondary | ICD-10-CM

## 2021-07-16 MED ORDER — DAPAGLIFLOZIN PROPANEDIOL 5 MG PO TABS: 5.0000 mg | ORAL_TABLET | Freq: Every day | ORAL | 3 refills | Status: DC

## 2021-07-16 NOTE — Progress Notes (Signed)
Patient ID: John Carrillo, male   DOB: [birth year], 71 y.o.   MRN: [id number]             Reason for Appointment: Type II Diabetes follow-up   History of Present Illness   Diagnosis date: 2008  Previous history:  Oral hypoglycemic drugs previously used are: [place] from 8/21 Insulin was started soon after his diagnosis and he has been mostly on glargine insulin with Humalog Not clear whether he was treated with metformin previously  A1c range in the last few years is:7.4-9.4  Recent history:     Non-insulin hypoglycemic drugs: Farxiga 5 mg daily     Insulin regimen: Lantus once a day midmorning, 35 units daily Humalog after meals: 4 at breakfast--0/4 lunch--6/8 after dinner         Side effects from medications: None  Current self management, blood sugar patterns and problems identified:  A1c is 7.9, unchanged He thinks his A1c improved any restarted [place] last year He generally checks his blood sugars only in the morning and did not bring his monitor Although he thinks his blood sugars are averaging 140 in the morning his lab glucose was 79 He thinks his blood sugars fluctuate in the morning based on his diet the night before He was afraid of trying any CGM because of not wanting any device on his body while having his cardiac device No recent significant hypoglycemia but previously has had some low sugars during the night which he dealt with by increasing his snacks He is trying to exercise at the gym up to 1 hour including both weights and cardio and occasionally this will cause low sugars which he will treat with snack followed by a protein shake.  Usually he waits till after his exercise regimen to take his breakfast Does not know how to count carbohydrates Usually eating some form of cereal at breakfast Always taking his Humalog after eating   Exercise: Gym 3-4/7 days Diet management: Blood sugars may be higher with eating out especially [place] food       Monitors blood glucose: Once a day usually.    Glucometer: One Touch.           Blood Glucose readings from recall  PRE-MEAL Fasting Lunch Dinner Bedtime Overall  Glucose range: 140      Mean/median:        POST-MEAL PC Breakfast PC Lunch PC Dinner  Glucose range:   ?  Mean/median:       Dietician visit: Most recent: At the time of diagnosis  Weight control:  Wt Readings from Last 3 Encounters:  07/17/21 175 lb 9.6 oz (79.7 kg)  04/25/21 176 lb (79.8 kg)  04/16/21 178 lb 3.2 oz (80.8 kg)            Diabetes labs:  Lab Results  Component Value Date   HGBA1C 7.9 (H) 07/13/2021   HGBA1C 7.9 (A) 04/16/2021   HGBA1C 8.4 (A) 01/17/2021   Lab Results  Component Value Date   MICROALBUR 1.6 07/13/2021   LDLCALC 51 04/03/2021   CREATININE 1.32 07/13/2021     Allergies as of 07/17/2021   No Known Allergies      Medication List        Accurate as of July 17, 2021  9:41 AM. If you have any questions, ask your nurse or doctor.          STOP taking these medications    Lantus SoloStar 100 UNIT/ML Solostar Pen Generic drug:  insulin glargine Replaced by: Toujeo SoloStar 300 UNIT/ML Solostar Pen       TAKE these medications    acetaminophen 325 MG tablet Commonly known as: TYLENOL Take 2 tablets (650 mg total) by mouth every 4 (four) hours as needed for headache or mild pain.   carvedilol 6.25 MG tablet Commonly known as: COREG TAKE 1 TABLET(6.25 MG) BY MOUTH TWICE DAILY   clopidogrel 75 MG tablet Commonly known as: PLAVIX TAKE 1 TABLET(75 MG) BY MOUTH DAILY   CoQ10 200 MG Caps Take 200 mg by mouth in the morning.   dapagliflozin propanediol 5 MG Tabs tablet Commonly known as: Farxiga Take 1 tablet (5 mg total) by mouth daily.   Dexcom G7 Sensor Misc 1 Device by Does not apply route as directed. Change sensor every 10 days   Entresto 49-51 MG Generic drug: sacubitril-valsartan TAKE 1 TABLET BY MOUTH EVERY MORNING AND EVERY NIGHT AT BEDTIME.  PLEASE KEEP UPCOMING APPT IN MARCH 2023 FOR MORE REFILLS.   ezetimibe 10 MG tablet Commonly known as: ZETIA TAKE 1 TABLET(10 MG) BY MOUTH DAILY   glucose blood test strip Commonly known as: OneTouch Verio 1 each by Other route 2 (two) times daily. And lancets 2/day   insulin lispro 100 UNIT/ML KwikPen Commonly known as: HumaLOG KwikPen 3 times a day (just before each meal) 7-3-7 units, and pen needles 4/day.   levothyroxine 112 MCG tablet Commonly known as: SYNTHROID Take 1 tablet (112 mcg total) by mouth daily before breakfast. What changed:  medication strength See the new instructions.   OneTouch Verio w/Device Kit 1 Device by Does not apply route daily.   PROBIOTIC PO Take 1 capsule by mouth every morning.   rosuvastatin 10 MG tablet Commonly known as: CRESTOR Take 1 tablet (10 mg total) by mouth daily.   tadalafil 5 MG tablet Commonly known as: CIALIS TAKE 1 TABLET(5 MG) BY MOUTH DAILY   Toujeo SoloStar 300 UNIT/ML Solostar Pen Generic drug: insulin glargine (1 Unit Dial) Inject 38 Units into the skin daily. 38 units once daily Replaces: Lantus SoloStar 100 UNIT/ML Solostar Pen        Allergies: No Known Allergies  Past Medical History:  Diagnosis Date   Acute kidney injury superimposed on chronic kidney disease ([place]) 01/25/2015   AICD (automatic cardioverter/defibrillator) present    CHF (congestive heart failure) ([place])    CORONARY ARTERY DISEASE 08/11/2006   CVA 12/19/2006   2008   DIABETES MELLITUS, TYPE I 08/11/2006   ECZEMA, HANDS 12/15/2009   History of kidney stones    10+ years ago   HYPERLIPIDEMIA 08/11/2006   HYPERTENSION 08/11/2006   Hypothyroidism    Myocardial infarction [facility])    Presence of permanent cardiac pacemaker    Type 2 diabetes mellitus without complications ([place]) 17/61/6073    Past Surgical History:  Procedure Laterality Date   CARDIAC CATHETERIZATION N/A 01/16/2015   Procedure: Left Heart Cath and Coronary Angiography;   Surgeon: Lorretta Harp, MD;  Location: [place] [facility];  Service: Cardiovascular;  Laterality: N/A;   CORONARY STENT PLACEMENT     EP IMPLANTABLE DEVICE N/A 01/10/2016   MDT Claria MRI Quad CRTD implanted by Dr Rayann Heman   INGUINAL HERNIA REPAIR Left 05/22/2020   Procedure: LEFT INGUINAL HERNIA REPAIR WITH MESH;  Surgeon: Coralie Keens, MD;  Location: [place];  Service: General;  Laterality: Left;   INSERT / REPLACE / [place] Left 05/22/2020   Procedure: INSERTION OF  MESH;  Surgeon: Coralie Keens, MD;  Location: [place];  Service: General;  Laterality: Left;   TEE WITHOUT CARDIOVERSION N/A 01/26/2015   Procedure: TRANSESOPHAGEAL ECHOCARDIOGRAM (TEE);  Surgeon: Sanda Klein, MD;  Location: [facility] ENDOSCOPY;  Service: Cardiovascular;  Laterality: N/A;   TONSILECTOMY, ADENOIDECTOMY, BILATERAL MYRINGOTOMY AND TUBES     TONSILLECTOMY     WISDOM TOOTH EXTRACTION     WRIST FRACTURE SURGERY Right 1960   3rd grade    Family History  Problem Relation Age of Onset   CAD Father    Other Mother        passed right before 107- tumor on kidney recent diagnosis   Dementia Mother        progressing rapidly   CAD Paternal Grandfather        54s   CAD Paternal Grandmother        23s   Brain cancer Maternal Grandmother    Lymphoma Sister        b cell   Celiac disease Sister    Diabetes Neg Hx     Social History:  reports that he quit smoking about 21 years ago. His smoking use included cigarettes. He has a 30.00 pack-year smoking history. He has never used smokeless tobacco. He reports current alcohol use of about 3.0 standard drinks of alcohol per week. He reports that he does not use drugs.  Review of Systems:  Last diabetic eye exam date 9/22 showing mild nonproliferative background retinopathy  Last foot exam date: 8/22  Symptoms of neuropathy: Usually none Has history of erectile dysfunction  Hypertension:   Treatment includes Entresto and Coreg  BP  Readings from Last 3 Encounters:  07/17/21 (!) 144/78  04/25/21 110/86  04/16/21 124/76   Lab Results  Component Value Date   CREATININE 1.32 07/13/2021   CREATININE 1.18 11/17/2020   CREATININE 1.32 (H) 05/18/2020    Lipids: Managed with Crestor 10 mg prescribed by his cardiologist but also on Zetia    Lab Results  Component Value Date   CHOL 124 04/03/2021   CHOL 124 12/18/2020   CHOL 123 06/16/2020   Lab Results  Component Value Date   HDL 53 04/03/2021   HDL 51 12/18/2020   HDL 51 06/16/2020   Lab Results  Component Value Date   LDLCALC 51 04/03/2021   LDLCALC 58 12/18/2020   LDLCALC 56 06/16/2020   Lab Results  Component Value Date   TRIG 109 04/03/2021   TRIG 71 12/18/2020   TRIG 81 06/16/2020   Lab Results  Component Value Date   CHOLHDL 2.3 04/03/2021   CHOLHDL 2.4 12/18/2020   CHOLHDL 2.4 06/16/2020   Lab Results  Component Value Date   LDLDIRECT 85.0 07/11/2017   LDLDIRECT 90.0 02/17/2015   LDLDIRECT 100.0 12/23/2014   He has been on thyroid supplement since about 2008 He thinks his initial symptoms were weight gain and fatigue Has not had any recent monitoring of his thyroid level and does not complain of any fatigue Currently taking levothyroxine 100 mcg which he takes before breakfast  Lab Results  Component Value Date   TSH 7.62 (H) 07/13/2021   TSH 4.42 03/14/2020   TSH 1.89 08/16/2019       Examination:   BP (!) 144/78   Pulse 60   Ht _0  (1.753 m)   Wt 175 lb 9.6 oz (79.7 kg)   SpO2 93%   BMI 25.93 kg/m   Body mass index is 25.93 kg/m.  ASSESSMENT/ PLAN:    Diabetes type 2:   Current regimen: Basal bolus insulin, Farxiga 5 mg daily  A1c is 7.9 and usually over 7  Blood glucose control is suboptimal and likely has postprandial hyperglycemia See history of present illness for detailed discussion of current diabetes management, blood sugar patterns and problems identified  HYPOTHYROIDISM: TSH is slightly high  although he is appearing asymptomatic, currently on 100 mcg of levothyroxine which he takes regularly  No evidence of diabetes related complication recently except erectile dysfunction and background retinopathy, has normal urine microalbumin  Needs to start checking blood sugars more regularly after meals He finally agrees to use the Dexcom CGM to monitor his blood sugars better and to be alerted regarding high and low sugars We will try to see if the Circle sensor is covered through [place] He will let us know if he needs any help starting the sensor Otherwise need to bring his monitor for download on each visit Switch Lantus to [facility] for better 24 control and more even action without variability and low sugars Discussed that if he has high readings over 130 he may need to go up 2 to 3 units under Toujeo Free coupon given for Toujeo He can take this at the same time daily Also needs to reduce the dose of Toujeo if he starts getting low sugars overnight Switch HUMALOG to before eating especially in the morning before breakfast Try to keep his sugars under 180 after meals and adjust insulin dose at suppertime based on postprandial readings May need to take additional 2 to 4 units if blood sugars are consistently higher 1 to 2 hours after any higher fat meals Snack before exercise to keep from getting low sugars LEVOTHYROXINE will be increased to 112 mcg instead of 100  Patient Instructions  Check blood sugars on waking up days a week  Also check blood sugars about 2 hours after meals and do this after different meals by rotation  Recommended blood sugar levels on waking up are 90-130 and about 2 hours after meal is 130-180  Please bring your blood sugar monitor to each visit, thank you  Take Humalog before meal  Snack before exercise  Toujeo replaces Lantus   Elayne Snare 07/17/2021, 9:41 AM

## 2021-07-17 ENCOUNTER — Ambulatory Visit: Payer: Medicare Other | Admitting: Endocrinology

## 2021-07-17 ENCOUNTER — Other Ambulatory Visit: Payer: Self-pay

## 2021-07-17 ENCOUNTER — Encounter: Payer: Self-pay | Admitting: Family Medicine

## 2021-07-17 VITALS — BP 144/78 | HR 60 | Ht 69.0 in | Wt 175.6 lb

## 2021-07-17 DIAGNOSIS — Z794 Long term (current) use of insulin: Secondary | ICD-10-CM | POA: Diagnosis not present

## 2021-07-17 DIAGNOSIS — M25512 Pain in left shoulder: Secondary | ICD-10-CM

## 2021-07-17 DIAGNOSIS — E039 Hypothyroidism, unspecified: Secondary | ICD-10-CM | POA: Diagnosis not present

## 2021-07-17 DIAGNOSIS — E113299 Type 2 diabetes mellitus with mild nonproliferative diabetic retinopathy without macular edema, unspecified eye: Secondary | ICD-10-CM

## 2021-07-17 DIAGNOSIS — E1165 Type 2 diabetes mellitus with hyperglycemia: Secondary | ICD-10-CM

## 2021-07-17 MED ORDER — TOUJEO SOLOSTAR 300 UNIT/ML ~~LOC~~ SOPN: 38.0000 [IU] | PEN_INJECTOR | Freq: Every day | SUBCUTANEOUS | 3 refills | Status: DC

## 2021-07-17 MED ORDER — LEVOTHYROXINE SODIUM 112 MCG PO TABS: 112.0000 ug | ORAL_TABLET | Freq: Every day | ORAL | 1 refills | Status: DC

## 2021-07-17 MED ORDER — DEXCOM G7 SENSOR MISC: 1.0000 | 3 refills | Status: DC

## 2021-07-17 NOTE — Patient Instructions (Addendum)
Check blood sugars on waking up days a week  Also check blood sugars about 2 hours after meals and do this after different meals by rotation  Recommended blood sugar levels on waking up are 90-130 and about 2 hours after meal is 130-180  Please bring your blood sugar monitor to each visit, thank you  Take Humalog before meal  Snack before exercise  Toujeo replaces Lantus

## 2021-07-20 ENCOUNTER — Encounter: Payer: Self-pay | Admitting: Surgical

## 2021-07-20 ENCOUNTER — Ambulatory Visit (INDEPENDENT_AMBULATORY_CARE_PROVIDER_SITE_OTHER): Payer: Medicare Other

## 2021-07-20 ENCOUNTER — Ambulatory Visit: Payer: Medicare Other | Admitting: Surgical

## 2021-07-20 DIAGNOSIS — G8929 Other chronic pain: Secondary | ICD-10-CM

## 2021-07-20 DIAGNOSIS — M25512 Pain in left shoulder: Secondary | ICD-10-CM

## 2021-07-20 DIAGNOSIS — Z87891 Personal history of nicotine dependence: Secondary | ICD-10-CM | POA: Diagnosis not present

## 2021-07-20 DIAGNOSIS — M7542 Impingement syndrome of left shoulder: Secondary | ICD-10-CM | POA: Diagnosis not present

## 2021-07-20 MED ORDER — LIDOCAINE HCL 1 % IJ SOLN
5.0000 mL | INTRAMUSCULAR | Status: AC | PRN
  Administered 2021-07-20: 5 mL

## 2021-07-20 MED ORDER — METHYLPREDNISOLONE ACETATE 40 MG/ML IJ SUSP
40.0000 mg | INTRAMUSCULAR | Status: AC | PRN
  Administered 2021-07-20: 40 mg via INTRA_ARTICULAR

## 2021-07-20 MED ORDER — BUPIVACAINE HCL 0.5 % IJ SOLN
9.0000 mL | INTRAMUSCULAR | Status: AC | PRN
  Administered 2021-07-20: 9 mL via INTRA_ARTICULAR

## 2021-07-20 NOTE — Progress Notes (Cosign Needed)
Office Visit Note   Patient: John Carrillo           Date of Birth: 04/13/1950           MRN: [id number] Visit Date: 07/20/2021 Requested by: Marin Olp, [place],  [place] [id number] PCP: Marin Olp, MD  Subjective: Chief Complaint  Patient presents with   Left Shoulder - Pain    HPI: John Carrillo is a 71 y.o. male who presents to the office complaining of left shoulder pain.  Patient states that he fell in January and landed directly on his left shoulder.  He had decreased range of motion following that injury but then it started getting better up until he had another fall where he fell directly onto the left shoulder again in early April and he had return of pain.  He developed frozen shoulder but did some home exercises that resolved this according to him.  He notes anterolateral pain with radiation down to the bicep and tricep.  He has occasional radiation to the wrist but nothing consistent.  No significant shoulder blade pain, neck pain, numbness/tingling.  He describes a throbbing throughout the day.  He has pain that wakes him up at night and cannot really lay on his left side.  He enjoys going to the gym and states that pulling exercises are okay for him but anything involving pushing such as push-ups or press exercises cause him severe pain.  No history of shoulder or neck surgery, shoulder dislocation.  He does not currently smoke but he does have a history of smoking; he smoked for 30 years but quit after his first heart attack in his 50s.  He notes weakness with abduction and has had to go down on weight from doing 20 pound dumbbell deltoid flies to 4 pound dumbbell deltoid flies.  He is currently retired.  Enjoys Administrator, hiking, going to the gym.  He does have a pacemaker.  Also has a history of diabetes with last A1c 7.9.[name]                ROS: All systems reviewed are negative as they relate to the chief complaint within the history of  present illness.  Patient denies fevers or chills.  Assessment & Plan: Visit Diagnoses:  1. Chronic left shoulder pain   2. Former smoker     Plan: Patient is a 71 year old male who presents for evaluation of left shoulder pain.  He has had increased left shoulder pain since multiple falls onto the shoulder with the last fall in April.  He initially had decreased range of motion but has been doing some home exercises focused on getting his motion back.  Excellent range of motion on exam today but he does have some reproduction of pain with testing his rotator cuff strength though he does not really have much in the way of weakness except with supraspinatus which seems more limited to pain rather than true weakness.  Radiographs of the shoulder showed no significant degenerative changes of the left shoulder joint with no significant loss of acromiohumeral interval.  After discussion of options, patient would like to try injection to see if this will provide relief for his symptoms.  Subacromial injection administered and patient tolerated the procedure well.  He will follow-up in 4 weeks for clinical recheck and if no improvement at that time, plan for further evaluation with MRI arthrogram of the left shoulder.  He does have  a pacemaker which appears to be MRI compatible based on his card but will have to call the number to make sure.  Additionally, patient has a fairly well-circumscribed opacity noted on the left lung.  He does have a 30-year smoking history though he has not smoked in a fairly long time.  No symptoms of shortness of breath or persistent cough.  With his history and with the x-ray finding, plan for CT chest to evaluate for any concerning lung abnormality.  Plan to call him with those results.  Follow-Up Instructions: No follow-ups on file.   Orders:  Orders Placed This Encounter  Procedures   XR Shoulder Left   CT CHEST WO CONTRAST   No orders of the defined types were placed in  this encounter.     Procedures: Large Joint Inj: L subacromial bursa on 07/20/2021 11:56 AM Indications: diagnostic evaluation and pain Details: 18 G 1.5 in needle, posterior approach  Arthrogram: No  Medications: 9 mL bupivacaine 0.5 %; 40 mg methylPREDNISolone acetate 40 MG/ML; 5 mL lidocaine 1 % Outcome: tolerated well, no immediate complications Procedure, treatment alternatives, risks and benefits explained, specific risks discussed. Consent was given by the patient. Immediately prior to procedure a time out was called to verify the correct patient, procedure, equipment, support staff and site/side marked as required. Patient was prepped and draped in the usual sterile fashion.       Clinical Data: No additional findings.  Objective: Vital Signs: There were no vitals taken for this visit.  Physical Exam:  Constitutional: Patient appears well-developed HEENT:  Head: Normocephalic Eyes:EOM are normal Neck: Normal range of motion Cardiovascular: Normal rate Pulmonary/chest: Effort normal Neurologic: Patient is alert Skin: Skin is warm Psychiatric: Patient has normal mood and affect  Ortho Exam: Ortho exam demonstrates right and left shoulders with 60 degrees external rotation, 100 degrees abduction, 180 degrees forward flexion.  Active range of motion equivalent to passive range of motion.  Positive Neer and Hawkins impingement signs, particularly with Hawkins.  No tenderness over the [facility] joint.  Mild tenderness over the bicipital groove.  Excellent strength of supra, infra, subscap with a little bit of weakness with supraspinatus after extended strength testing.  This seems more limited to pain.  5/5 motor strength of bilateral grip strength, finger abduction, pronation/supination, bicep, tricep, deltoid.  Axillary nerve intact with deltoid firing.  No tenderness over the axial cervical spine.  Negative Lhermitte sign.  Negative Spurling sign.  Negative external rotation lag  sign.  Negative Hornblower sign.  Specialty Comments:  No specialty comments available.  Imaging: No results found.   PMFS History: Patient Active Problem List   Diagnosis Date Noted   Type 1 diabetes mellitus with diabetic peripheral angiopathy without gangrene (HCC) 03/14/2020   Diabetes (HCC) 01/15/2020   Erectile dysfunction 05/14/2019   Left groin hernia 02/13/2018   Allergic rhinitis 07/11/2017   Epidermoid cyst of ear 10/22/2016   Chronic systolic dysfunction of left ventricle 01/10/2016   Hypothyroidism 02/17/2015   Chronic systolic CHF (congestive heart failure) (HCC) 01/25/2015   Cardiomyopathy, ischemic 01/17/2015   LBBB (left bundle branch block) 01/17/2015   CKD (chronic kidney disease), stage III (HCC) 01/17/2015   SVT (supraventricular tachycardia) (HCC) 01/14/2015   Former smoker 06/24/2014   Eczema of both hands 12/15/2009   History of CVA (cerebrovascular accident) 12/19/2006   Dyslipidemia 08/11/2006   Essential hypertension 08/11/2006   CAD S/P LAD PCI'03, RCA DES 01/16/15 08/11/2006   Past Medical History:  Diagnosis Date  Acute kidney injury superimposed on chronic kidney disease (HCC) 01/25/2015   AICD (automatic cardioverter/defibrillator) present    CHF (congestive heart failure) (HCC)    CORONARY ARTERY DISEASE 08/11/2006   CVA 12/19/2006   2008   DIABETES MELLITUS, TYPE I 08/11/2006   ECZEMA, HANDS 12/15/2009   History of kidney stones    10+ years ago   HYPERLIPIDEMIA 08/11/2006   HYPERTENSION 08/11/2006   Hypothyroidism    Myocardial infarction [facility])    Presence of permanent cardiac pacemaker    Type 2 diabetes mellitus without complications (HCC) 12/20/2016    Family History  Problem Relation Age of Onset   CAD Father    Other Mother        passed right before 80- tumor on kidney recent diagnosis   Dementia Mother        progressing rapidly   CAD Paternal Grandfather        77s   CAD Paternal Grandmother        35s   Brain cancer  Maternal Grandmother    Lymphoma Sister        b cell   Celiac disease Sister    Diabetes Neg Hx     Past Surgical History:  Procedure Laterality Date   CARDIAC CATHETERIZATION N/A 01/16/2015   Procedure: Left Heart Cath and Coronary Angiography;  Surgeon: [name], MD;  Location[facility] INVASIVE CV LAB;  Service: Cardiovascular;  Laterality: N/A;   CORONARY STENT PLACEMENT     EP IMPLANTABLE DEVICE N/A 01/10/2016   MDT Claria MRI Quad CRTD implanted by Dr [name]   INGUINAL HERNIA REPAIR Left 05/22/2020   Procedure: LEFT INGUINAL HERNIA REPAIR WITH MESH;  Surgeon: Abigail Miyamoto, MD;  Location: [facility] OR;  Service: General;  Laterality: Left;   INSERT / REPLACE / REMOVE PACEMAKER     INSERTION OF MESH Left 05/22/2020   Procedure: INSERTION OF MESH;  Surgeon: Abigail Miyamoto, MD;  Location: [facility] OR;  Service: General;  Laterality: Left;   TEE WITHOUT CARDIOVERSION N/A 01/26/2015   Procedure: TRANSESOPHAGEAL ECHOCARDIOGRAM (TEE);  Surgeon: [name], MD;  Location: [facility] ENDOSCOPY;  Service: Cardiovascular;  Laterality: N/A;   TONSILECTOMY, ADENOIDECTOMY, BILATERAL MYRINGOTOMY AND TUBES     TONSILLECTOMY     WISDOM TOOTH EXTRACTION     WRIST FRACTURE SURGERY Right 1960   3rd grade   Social History   Occupational History   Occupation: Engineer, maintenance: [employer]  Tobacco Use   Smoking status: Former    Packs/day: 1.00    Years: 30.00    Total pack years: 30.00    Types: Cigarettes    Quit date: 05/03/2000    Years since quitting: 21.2   Smokeless tobacco: Never  Vaping Use   Vaping Use: Never used  Substance and Sexual Activity   Alcohol use: Yes    Alcohol/week: 3.0 standard drinks of alcohol    Types: 1 Glasses of wine, 1 Cans of beer, 1 Shots of liquor per week    Comment: 1-2/month   Drug use: No   Sexual activity: Not on file

## 2021-07-25 NOTE — Progress Notes (Signed)
Remote ICD transmission.   

## 2021-08-03 ENCOUNTER — Other Ambulatory Visit: Payer: Self-pay | Admitting: Family Medicine

## 2021-08-17 ENCOUNTER — Ambulatory Visit: Payer: Medicare Other | Admitting: Orthopedic Surgery

## 2021-08-17 DIAGNOSIS — M7542 Impingement syndrome of left shoulder: Secondary | ICD-10-CM

## 2021-08-18 ENCOUNTER — Encounter: Payer: Self-pay | Admitting: Orthopedic Surgery

## 2021-08-18 NOTE — Progress Notes (Signed)
Office Visit Note   Patient: John Carrillo           Date of Birth: 1950/04/03           MRN: [id number] Visit Date: 08/17/2021 Requested by: [name], MD [address] [place],  [place] [address] PCP: [name], MD  Subjective: Chief Complaint  Patient presents with   Left Shoulder - Follow-up    HPI: John Carrillo is a 71 year old patient with left shoulder pain.  Had subacromial injection 07/20/2021.  Did get some relief with pain but still has some issues.  Does report increased range of motion since the injection.  He is able to lift 8 pounds without pain.  Describes shooting pain.  Occasionally wakes him from sleep.  His CBGs did go up after the cortisone injection.  Does have pain which radiates to the biceps.              ROS: All systems reviewed are negative as they relate to the chief complaint within the history of present illness.  Patient denies  fevers or chills.   Assessment & Plan: Visit Diagnoses:  1. Impingement syndrome of left shoulder     Plan: Impression is likely biceps mediated pain source from either SLAP tear or tendinitis.  Range of motion is pretty reasonable at this time bilaterally with no asymmetric loss of external rotation.  Overall sleep has improved and desires no further intervention.  If further interventions were required I would consider ultrasound-guided injection into the glenohumeral joint for what appears to be biceps versus SLAP tear mediated pain.  Follow-up as needed.  Follow-Up Instructions: No follow-ups on file.   Orders:  No orders of the defined types were placed in this encounter.  No orders of the defined types were placed in this encounter.     Procedures: No procedures performed   Clinical Data: No additional findings.  Objective: Vital Signs: There were no vitals taken for this visit.  Physical Exam:   Constitutional: Patient appears well-developed HEENT:  Head: Normocephalic Eyes:EOM are  normal Neck: Normal range of motion Cardiovascular: Normal rate Pulmonary/chest: Effort normal Neurologic: Patient is alert Skin: Skin is warm Psychiatric: Patient has normal mood and affect   Ortho Exam: Ortho exam demonstrates full active and passive range of motion of the cervical spine.  Bilateral shoulder range of motion is 75/110/180.  Does have mild AC joint tenderness on the left compared to the right.  Pain radiates into the biceps region.  Does have a little bit of pain with resisted supination on the left which is not present on the right.  O'Brien's testing equivocal on the left negative on the right.  Speeds testing positive on the left negative on the right.  Shoulder stability is excellent anterior and posterior with less than a centimeter sulcus sign.  Specialty Comments:  No specialty comments available.  Imaging: No results found.   PMFS History: Patient Active Problem List   Diagnosis Date Noted   Type 1 diabetes mellitus with diabetic peripheral angiopathy without gangrene (HCC) 03/14/2020   Diabetes (HCC) 01/15/2020   Erectile dysfunction 05/14/2019   Left groin hernia 02/13/2018   Allergic rhinitis 07/11/2017   Epidermoid cyst of ear 10/22/2016   Chronic systolic dysfunction of left ventricle 01/10/2016   Hypothyroidism 02/17/2015   Chronic systolic CHF (congestive heart failure) (HCC) 01/25/2015   Cardiomyopathy, ischemic 01/17/2015   LBBB (left bundle branch block) 01/17/2015   CKD (chronic kidney disease), stage  III (HCC) 01/17/2015   SVT (supraventricular tachycardia) (HCC) 01/14/2015   Former smoker 06/24/2014   Eczema of both hands 12/15/2009   History of CVA (cerebrovascular accident) 12/19/2006   Dyslipidemia 08/11/2006   Essential hypertension 08/11/2006   CAD S/P LAD PCI'03, RCA DES 01/16/15 08/11/2006   Past Medical History:  Diagnosis Date   Acute kidney injury superimposed on chronic kidney disease (HCC) 01/25/2015   AICD (automatic  cardioverter/defibrillator) present    CHF (congestive heart failure) (HCC)    CORONARY ARTERY DISEASE 08/11/2006   CVA 12/19/2006   2008   DIABETES MELLITUS, TYPE I 08/11/2006   ECZEMA, HANDS 12/15/2009   History of kidney stones    10+ years ago   HYPERLIPIDEMIA 08/11/2006   HYPERTENSION 08/11/2006   Hypothyroidism    Myocardial infarction [facility])    Presence of permanent cardiac pacemaker    Type 2 diabetes mellitus without complications (HCC) 12/20/2016    Family History  Problem Relation Age of Onset   CAD Father    Other Mother        passed right before 22- tumor on kidney recent diagnosis   Dementia Mother        progressing rapidly   CAD Paternal Grandfather        46s   CAD Paternal Grandmother        20s   Brain cancer Maternal Grandmother    Lymphoma Sister        b cell   Celiac disease Sister    Diabetes Neg Hx     Past Surgical History:  Procedure Laterality Date   CARDIAC CATHETERIZATION N/A 01/16/2015   Procedure: Left Heart Cath and Coronary Angiography;  Surgeon: [name], MD;  Location: [facility] INVASIVE CV LAB;  Service: Cardiovascular;  Laterality: N/A;   CORONARY STENT PLACEMENT     EP IMPLANTABLE DEVICE N/A 01/10/2016   MDT [place] MRI Quad CRTD implanted by Dr [name]   INGUINAL HERNIA REPAIR Left 05/22/2020   Procedure: LEFT INGUINAL HERNIA REPAIR WITH MESH;  Surgeon: Abigail Miyamoto, MD;  Location: [facility] OR;  Service: General;  Laterality: Left;   INSERT / REPLACE / REMOVE PACEMAKER     INSERTION OF MESH Left 05/22/2020   Procedure: INSERTION OF MESH;  Surgeon: Abigail Miyamoto, MD;  Location: [facility] OR;  Service: General;  Laterality: Left;   TEE WITHOUT CARDIOVERSION N/A 01/26/2015   Procedure: TRANSESOPHAGEAL ECHOCARDIOGRAM (TEE);  Surgeon: [name], MD;  Location: [facility] ENDOSCOPY;  Service: Cardiovascular;  Laterality: N/A;   TONSILECTOMY, ADENOIDECTOMY, BILATERAL MYRINGOTOMY AND TUBES     TONSILLECTOMY     WISDOM TOOTH EXTRACTION     WRIST FRACTURE  SURGERY Right 1960   3rd grade   Social History   Occupational History   Occupation: Engineer, maintenance: [employer]  Tobacco Use   Smoking status: Former    Packs/day: 1.00    Years: 30.00    Total pack years: 30.00    Types: Cigarettes    Quit date: 05/03/2000    Years since quitting: 21.3   Smokeless tobacco: Never  Vaping Use   Vaping Use: Never used  Substance and Sexual Activity   Alcohol use: Yes    Alcohol/week: 3.0 standard drinks of alcohol    Types: 1 Glasses of wine, 1 Cans of beer, 1 Shots of liquor per week    Comment: 1-2/month   Drug use: No   Sexual activity: Not on file

## 2021-08-21 ENCOUNTER — Other Ambulatory Visit: Payer: Medicare Other

## 2021-09-02 ENCOUNTER — Encounter (HOSPITAL_COMMUNITY): Payer: Self-pay | Admitting: Emergency Medicine

## 2021-09-02 ENCOUNTER — Observation Stay (HOSPITAL_COMMUNITY)
Admission: EM | Admit: 2021-09-02 | Discharge: 2021-09-03 | Disposition: A | Discharge status: Home / Self Care | Payer: Medicare Other | Attending: Cardiovascular Disease | Admitting: Cardiovascular Disease

## 2021-09-02 ENCOUNTER — Other Ambulatory Visit: Payer: Self-pay

## 2021-09-02 ENCOUNTER — Emergency Department (HOSPITAL_COMMUNITY): Payer: Medicare Other

## 2021-09-02 ENCOUNTER — Observation Stay (HOSPITAL_BASED_OUTPATIENT_CLINIC_OR_DEPARTMENT_OTHER): Payer: Medicare Other

## 2021-09-02 ENCOUNTER — Other Ambulatory Visit: Payer: Self-pay | Admitting: Cardiovascular Disease

## 2021-09-02 DIAGNOSIS — Z955 Presence of coronary angioplasty implant and graft: Secondary | ICD-10-CM | POA: Insufficient documentation

## 2021-09-02 DIAGNOSIS — R079 Chest pain, unspecified: Secondary | ICD-10-CM | POA: Diagnosis present

## 2021-09-02 DIAGNOSIS — E039 Hypothyroidism, unspecified: Secondary | ICD-10-CM | POA: Insufficient documentation

## 2021-09-02 DIAGNOSIS — Z8673 Personal history of transient ischemic attack (TIA), and cerebral infarction without residual deficits: Secondary | ICD-10-CM | POA: Diagnosis not present

## 2021-09-02 DIAGNOSIS — I309 Acute pericarditis, unspecified: Secondary | ICD-10-CM | POA: Diagnosis not present

## 2021-09-02 DIAGNOSIS — Z7902 Long term (current) use of antithrombotics/antiplatelets: Secondary | ICD-10-CM | POA: Diagnosis not present

## 2021-09-02 DIAGNOSIS — Z7901 Long term (current) use of anticoagulants: Secondary | ICD-10-CM | POA: Insufficient documentation

## 2021-09-02 DIAGNOSIS — Z7984 Long term (current) use of oral hypoglycemic drugs: Secondary | ICD-10-CM | POA: Insufficient documentation

## 2021-09-02 DIAGNOSIS — R0789 Other chest pain: Principal | ICD-10-CM | POA: Insufficient documentation

## 2021-09-02 DIAGNOSIS — Z87891 Personal history of nicotine dependence: Secondary | ICD-10-CM | POA: Diagnosis not present

## 2021-09-02 DIAGNOSIS — I255 Ischemic cardiomyopathy: Secondary | ICD-10-CM | POA: Diagnosis not present

## 2021-09-02 DIAGNOSIS — I447 Left bundle-branch block, unspecified: Secondary | ICD-10-CM | POA: Diagnosis not present

## 2021-09-02 DIAGNOSIS — I252 Old myocardial infarction: Secondary | ICD-10-CM | POA: Insufficient documentation

## 2021-09-02 DIAGNOSIS — E7800 Pure hypercholesterolemia, unspecified: Secondary | ICD-10-CM | POA: Insufficient documentation

## 2021-09-02 DIAGNOSIS — Z95810 Presence of automatic (implantable) cardiac defibrillator: Secondary | ICD-10-CM | POA: Insufficient documentation

## 2021-09-02 DIAGNOSIS — Z8249 Family history of ischemic heart disease and other diseases of the circulatory system: Secondary | ICD-10-CM | POA: Insufficient documentation

## 2021-09-02 DIAGNOSIS — Z79899 Other long term (current) drug therapy: Secondary | ICD-10-CM | POA: Diagnosis not present

## 2021-09-02 DIAGNOSIS — E1022 Type 1 diabetes mellitus with diabetic chronic kidney disease: Secondary | ICD-10-CM | POA: Diagnosis not present

## 2021-09-02 DIAGNOSIS — Z9181 History of falling: Secondary | ICD-10-CM | POA: Diagnosis not present

## 2021-09-02 DIAGNOSIS — I2510 Atherosclerotic heart disease of native coronary artery without angina pectoris: Secondary | ICD-10-CM | POA: Diagnosis not present

## 2021-09-02 DIAGNOSIS — E119 Type 2 diabetes mellitus without complications: Secondary | ICD-10-CM | POA: Insufficient documentation

## 2021-09-02 DIAGNOSIS — N183 Chronic kidney disease, stage 3 unspecified: Secondary | ICD-10-CM | POA: Insufficient documentation

## 2021-09-02 DIAGNOSIS — I130 Hypertensive heart and chronic kidney disease with heart failure and stage 1 through stage 4 chronic kidney disease, or unspecified chronic kidney disease: Secondary | ICD-10-CM | POA: Diagnosis not present

## 2021-09-02 DIAGNOSIS — I5022 Chronic systolic (congestive) heart failure: Secondary | ICD-10-CM | POA: Diagnosis not present

## 2021-09-02 DIAGNOSIS — Z794 Long term (current) use of insulin: Secondary | ICD-10-CM | POA: Insufficient documentation

## 2021-09-02 DIAGNOSIS — Z950 Presence of cardiac pacemaker: Secondary | ICD-10-CM | POA: Insufficient documentation

## 2021-09-02 LAB — I-STAT CHEM 8, ED
BUN: 19 mg/dL (ref 8–23)
Calcium, Ion: 1.12 mmol/L — ABNORMAL LOW (ref 1.15–1.40)
Chloride: 107 mmol/L (ref 98–111)
Creatinine, Ser: 1.2 mg/dL (ref 0.61–1.24)
Glucose, Bld: 105 mg/dL — ABNORMAL HIGH (ref 70–99)
HCT: 46 % (ref 39.0–52.0)
Hemoglobin: 15.6 g/dL (ref 13.0–17.0)
Potassium: 3.6 mmol/L (ref 3.5–5.1)
Sodium: 141 mmol/L (ref 135–145)
TCO2: 25 mmol/L (ref 22–32)

## 2021-09-02 LAB — ECHOCARDIOGRAM COMPLETE
Area-P 1/2: 4.49 cm2
S' Lateral: 3.7 cm
Single Plane A4C EF: 29.3 %
Weight: 2928 oz

## 2021-09-02 LAB — PROTIME-INR
INR: 0.9 (ref 0.8–1.2)
Prothrombin Time: 12.5 seconds (ref 11.4–15.2)

## 2021-09-02 LAB — CBC
HCT: 46 % (ref 39.0–52.0)
Hemoglobin: 15.6 g/dL (ref 13.0–17.0)
MCH: 33.9 pg (ref 26.0–34.0)
MCHC: 33.9 g/dL (ref 30.0–36.0)
MCV: 100 fL (ref 80.0–100.0)
Platelets: 162 10*3/uL (ref 150–400)
RBC: 4.6 MIL/uL (ref 4.22–5.81)
RDW: 12.9 % (ref 11.5–15.5)
WBC: 10.7 10*3/uL — ABNORMAL HIGH (ref 4.0–10.5)
nRBC: 0 % (ref 0.0–0.2)

## 2021-09-02 LAB — BASIC METABOLIC PANEL
Anion gap: 7 (ref 5–15)
BUN: 16 mg/dL (ref 8–23)
CO2: 24 mmol/L (ref 22–32)
Calcium: 8.8 mg/dL — ABNORMAL LOW (ref 8.9–10.3)
Chloride: 109 mmol/L (ref 98–111)
Creatinine, Ser: 1.29 mg/dL — ABNORMAL HIGH (ref 0.61–1.24)
GFR, Estimated: 60 mL/min — ABNORMAL LOW (ref >60)
Glucose, Bld: 118 mg/dL — ABNORMAL HIGH (ref 70–99)
Potassium: 3.7 mmol/L (ref 3.5–5.1)
Sodium: 140 mmol/L (ref 135–145)

## 2021-09-02 LAB — TROPONIN I (HIGH SENSITIVITY)
Troponin I (High Sensitivity): 16 ng/L (ref <18)
Troponin I (High Sensitivity): 15 ng/L (ref <18)

## 2021-09-02 LAB — CBG MONITORING, ED
Glucose-Capillary: 105 mg/dL — ABNORMAL HIGH (ref 70–99)
Glucose-Capillary: 110 mg/dL — ABNORMAL HIGH (ref 70–99)

## 2021-09-02 LAB — GLUCOSE, CAPILLARY
Glucose-Capillary: 104 mg/dL — ABNORMAL HIGH (ref 70–99)
Glucose-Capillary: 329 mg/dL — ABNORMAL HIGH (ref 70–99)
Glucose-Capillary: 293 mg/dL — ABNORMAL HIGH (ref 70–99)

## 2021-09-02 LAB — I-STAT BETA HCG BLOOD, ED (MC, WL, AP ONLY): I-stat hCG, quantitative: <5 m[IU]/mL (ref <5)

## 2021-09-02 LAB — BRAIN NATRIURETIC PEPTIDE: B Natriuretic Peptide: 142.3 pg/mL — ABNORMAL HIGH (ref 0.0–100.0)

## 2021-09-02 LAB — SEDIMENTATION RATE: Sed Rate: 13 mm/hr (ref 0–16)

## 2021-09-02 LAB — C-REACTIVE PROTEIN: CRP: 3 mg/dL — ABNORMAL HIGH (ref <1.0)

## 2021-09-02 LAB — D-DIMER, QUANTITATIVE: D-Dimer, Quant: 0.55 ug/mL-FEU — ABNORMAL HIGH (ref 0.00–0.50)

## 2021-09-02 MED ORDER — FENTANYL CITRATE PF 50 MCG/ML IJ SOSY
50.0000 ug | PREFILLED_SYRINGE | Freq: Once | INTRAMUSCULAR | Status: AC
  Administered 2021-09-02: 50 ug via INTRAVENOUS
  Filled 2021-09-02: qty 1

## 2021-09-02 MED ORDER — NITROGLYCERIN 0.4 MG SL SUBL
0.4000 mg | SUBLINGUAL_TABLET | SUBLINGUAL | Status: DC | PRN
  Filled 2021-09-02: qty 1

## 2021-09-02 MED ORDER — DAPAGLIFLOZIN PROPANEDIOL 5 MG PO TABS
5.0000 mg | ORAL_TABLET | Freq: Every day | ORAL | Status: DC
  Administered 2021-09-02 – 2021-09-03 (×2): 5 mg via ORAL
  Filled 2021-09-02 (×2): qty 1

## 2021-09-02 MED ORDER — FENTANYL CITRATE PF 50 MCG/ML IJ SOSY
50.0000 ug | PREFILLED_SYRINGE | INTRAMUSCULAR | Status: DC | PRN
  Administered 2021-09-02 – 2021-09-03 (×5): 50 ug via INTRAVENOUS
  Filled 2021-09-02 (×5): qty 1

## 2021-09-02 MED ORDER — ONDANSETRON HCL 4 MG/2ML IJ SOLN: 4.0000 mg | Freq: Four times a day (QID) | INTRAMUSCULAR | Status: DC | PRN

## 2021-09-02 MED ORDER — PROBIOTIC 250 MG PO CAPS
1.0000 | ORAL_CAPSULE | Freq: Every morning | ORAL | Status: DC
Start: 2021-09-02 — End: 2021-09-02

## 2021-09-02 MED ORDER — KETOROLAC TROMETHAMINE 30 MG/ML IJ SOLN
30.0000 mg | Freq: Once | INTRAMUSCULAR | Status: AC
  Administered 2021-09-02: 30 mg via INTRAVENOUS
  Filled 2021-09-02: qty 1

## 2021-09-02 MED ORDER — LEVOTHYROXINE SODIUM 112 MCG PO TABS
112.0000 ug | ORAL_TABLET | Freq: Every day | ORAL | Status: DC
  Administered 2021-09-02 – 2021-09-03 (×2): 112 ug via ORAL
  Filled 2021-09-02 (×3): qty 1

## 2021-09-02 MED ORDER — INSULIN GLARGINE (1 UNIT DIAL) 300 UNIT/ML ~~LOC~~ SOPN: 38.0000 [IU] | PEN_INJECTOR | Freq: Every day | SUBCUTANEOUS | Status: DC

## 2021-09-02 MED ORDER — HYDROCODONE-ACETAMINOPHEN 5-325 MG PO TABS
1.0000 | ORAL_TABLET | Freq: Four times a day (QID) | ORAL | Status: DC | PRN
  Administered 2021-09-02 (×3): 2 via ORAL
  Filled 2021-09-02 (×4): qty 2

## 2021-09-02 MED ORDER — ALPRAZOLAM 0.25 MG PO TABS
0.2500 mg | ORAL_TABLET | Freq: Two times a day (BID) | ORAL | Status: DC | PRN
  Administered 2021-09-02: 0.25 mg via ORAL
  Filled 2021-09-02: qty 1

## 2021-09-02 MED ORDER — ACETAMINOPHEN 325 MG PO TABS
650.0000 mg | ORAL_TABLET | ORAL | Status: DC | PRN
  Filled 2021-09-02: qty 2

## 2021-09-02 MED ORDER — CARVEDILOL 6.25 MG PO TABS
6.2500 mg | ORAL_TABLET | Freq: Two times a day (BID) | ORAL | Status: DC
  Administered 2021-09-02 – 2021-09-03 (×2): 6.25 mg via ORAL
  Filled 2021-09-02 (×2): qty 1

## 2021-09-02 MED ORDER — COQ10 200 MG PO CAPS: 200.0000 mg | ORAL_CAPSULE | Freq: Every morning | ORAL | Status: DC

## 2021-09-02 MED ORDER — CLOPIDOGREL BISULFATE 75 MG PO TABS
75.0000 mg | ORAL_TABLET | Freq: Every day | ORAL | Status: DC
  Administered 2021-09-02 – 2021-09-03 (×2): 75 mg via ORAL
  Filled 2021-09-02 (×2): qty 1

## 2021-09-02 MED ORDER — NITROGLYCERIN 2 % TD OINT
1.0000 [in_us] | TOPICAL_OINTMENT | Freq: Once | TRANSDERMAL | Status: AC
  Administered 2021-09-02: 1 [in_us] via TOPICAL
  Filled 2021-09-02: qty 1

## 2021-09-02 MED ORDER — PREDNISONE 20 MG PO TABS
40.0000 mg | ORAL_TABLET | Freq: Every day | ORAL | Status: DC
  Administered 2021-09-02 – 2021-09-03 (×2): 40 mg via ORAL
  Filled 2021-09-02 (×2): qty 2

## 2021-09-02 MED ORDER — INSULIN ASPART 100 UNIT/ML IJ SOLN
0.0000 [IU] | Freq: Three times a day (TID) | INTRAMUSCULAR | Status: DC
  Administered 2021-09-03: 8 [IU] via SUBCUTANEOUS

## 2021-09-02 MED ORDER — INSULIN GLARGINE-YFGN 100 UNIT/ML ~~LOC~~ SOLN
30.0000 [IU] | Freq: Every day | SUBCUTANEOUS | Status: DC
  Administered 2021-09-02: 30 [IU] via SUBCUTANEOUS
  Filled 2021-09-02 (×2): qty 0.3

## 2021-09-02 MED ORDER — INSULIN ASPART 100 UNIT/ML IJ SOLN
0.0000 [IU] | Freq: Every day | INTRAMUSCULAR | Status: DC
  Administered 2021-09-02: 4 [IU] via SUBCUTANEOUS

## 2021-09-02 MED ORDER — ENOXAPARIN SODIUM 40 MG/0.4ML IJ SOSY
40.0000 mg | PREFILLED_SYRINGE | Freq: Every day | INTRAMUSCULAR | Status: DC
  Administered 2021-09-02 – 2021-09-03 (×2): 40 mg via SUBCUTANEOUS
  Filled 2021-09-02 (×2): qty 0.4

## 2021-09-02 MED ORDER — ZOLPIDEM TARTRATE 5 MG PO TABS: 5.0000 mg | ORAL_TABLET | Freq: Every evening | ORAL | Status: DC | PRN

## 2021-09-02 MED ORDER — COLCHICINE 0.6 MG PO TABS
0.6000 mg | ORAL_TABLET | Freq: Every day | ORAL | Status: DC
  Administered 2021-09-02 – 2021-09-03 (×2): 0.6 mg via ORAL
  Filled 2021-09-02 (×2): qty 1

## 2021-09-02 MED ORDER — SACUBITRIL-VALSARTAN 49-51 MG PO TABS
1.0000 | ORAL_TABLET | Freq: Two times a day (BID) | ORAL | Status: DC
  Administered 2021-09-02 – 2021-09-03 (×3): 1 via ORAL
  Filled 2021-09-02 (×3): qty 1

## 2021-09-02 MED ORDER — ASPIRIN 81 MG PO CHEW
243.0000 mg | CHEWABLE_TABLET | Freq: Once | ORAL | Status: AC
  Administered 2021-09-02: 243 mg via ORAL
  Filled 2021-09-02: qty 3

## 2021-09-02 MED ORDER — RISAQUAD PO CAPS
1.0000 | ORAL_CAPSULE | Freq: Every day | ORAL | Status: DC
  Administered 2021-09-02 – 2021-09-03 (×2): 1 via ORAL
  Filled 2021-09-02 (×2): qty 1

## 2021-09-02 MED ORDER — ROSUVASTATIN CALCIUM 5 MG PO TABS
10.0000 mg | ORAL_TABLET | Freq: Every day | ORAL | Status: DC
  Administered 2021-09-02 – 2021-09-03 (×2): 10 mg via ORAL
  Filled 2021-09-02 (×2): qty 2

## 2021-09-02 MED ORDER — EZETIMIBE 10 MG PO TABS
10.0000 mg | ORAL_TABLET | Freq: Every day | ORAL | Status: DC
  Administered 2021-09-02 – 2021-09-03 (×2): 10 mg via ORAL
  Filled 2021-09-02 (×2): qty 1

## 2021-09-02 NOTE — ED Notes (Signed)
Patient transported to X-ray 

## 2021-09-02 NOTE — ED Provider Notes (Signed)
[employer] rep called to advise pacer interrogation results. No arrhythmia detected since last check October 2022.  There are occasional fast heart rate episodes with ventricular sensing most recent this morning.  Pacer showing normal function and operating within its set parameters.   Arby Barrette, MD 09/02/21 [phone]

## 2021-09-02 NOTE — ED Triage Notes (Signed)
Patient reports left chest pain radiating to left shoulder/left upper back with SOB onset yesterday afternoon , no emesis or diaphoresis , his cardiologist is Dr. [name] . History of CAD/MI/coronary stents /CHF .

## 2021-09-02 NOTE — ED Provider Notes (Addendum)
[facility] EMERGENCY DEPARTMENT Provider Note  CSN: [id number] Arrival date & time: 09/02/21 0236  Chief Complaint(s) Chest Pain (Hx.CAD/Stents/CHF)  HPI John Carrillo is a 71 y.o. male with a past medical history listed below including hypertension, hyperlipidemia, diabetes, prior heart attack status post stenting x2, systolic heart failure status post CRT/ICD who presents to the emergency department with a left-sided chest pain.   Chest Pain Pain location:  L chest Pain quality: stabbing   Pain radiates to:  L shoulder and upper back Pain severity:  Moderate Onset quality:  Gradual Duration:  16 hours Timing: intially intermittent. constant for 6 hours. Chronicity:  Recurrent Relieved by:  Nothing Worsened by:  Nothing Associated symptoms: shortness of breath   Associated symptoms: no cough, no fatigue, no fever, no headache, no nausea and no vomiting   Risk factors: coronary artery disease, diabetes mellitus, high cholesterol, hypertension and male sex     Past Medical History Past Medical History:  Diagnosis Date   Acute kidney injury superimposed on chronic kidney disease ([place]) 01/25/2015   AICD (automatic cardioverter/defibrillator) present    CHF (congestive heart failure) ([place])    CORONARY ARTERY DISEASE 08/11/2006   CVA 12/19/2006   2008   DIABETES MELLITUS, TYPE I 08/11/2006   ECZEMA, HANDS 12/15/2009   History of kidney stones    10+ years ago   HYPERLIPIDEMIA 08/11/2006   HYPERTENSION 08/11/2006   Hypothyroidism    Myocardial infarction [facility])    Presence of permanent cardiac pacemaker    Type 2 diabetes mellitus without complications ([place]) 80/88/1103   Patient Active Problem List   Diagnosis Date Noted   Type 1 diabetes mellitus with diabetic peripheral angiopathy without gangrene ([place]) 03/14/2020   Diabetes ([place]) 01/15/2020   Erectile dysfunction 05/14/2019   Left groin hernia 02/13/2018   Allergic rhinitis 07/11/2017   Epidermoid cyst  of ear 15/94/5859   Chronic systolic dysfunction of left ventricle 01/10/2016   Hypothyroidism 29/24/4628   Chronic systolic CHF (congestive heart failure) ([place]) 01/25/2015   Cardiomyopathy, ischemic 01/17/2015   LBBB (left bundle branch block) 01/17/2015   CKD (chronic kidney disease), stage III ([place]) 01/17/2015   SVT (supraventricular tachycardia) ([place]) 01/14/2015   Former smoker 06/24/2014   Eczema of both hands 12/15/2009   History of CVA (cerebrovascular accident) 12/19/2006   Dyslipidemia 08/11/2006   Essential hypertension 08/11/2006   CAD S/P LAD PCI'03, RCA DES 01/16/15 08/11/2006   Home Medication(s) Prior to Admission medications   Medication Sig Start Date End Date Taking? Authorizing Provider  acetaminophen (TYLENOL) 325 MG tablet Take 2 tablets (650 mg total) by mouth every 4 (four) hours as needed for headache or mild pain. 01/17/15   Erlene Quan, PA-C  Blood Glucose Monitoring Suppl (ONETOUCH VERIO) w/Device KIT 1 Device by Does not apply route daily. 10/02/20   Renato Shin, MD  carvedilol (COREG) 6.25 MG tablet TAKE 1 TABLET(6.25 MG) BY MOUTH TWICE DAILY 01/02/21   Shirley Friar, PA-C  clopidogrel (PLAVIX) 75 MG tablet TAKE 1 TABLET(75 MG) BY MOUTH DAILY 04/11/21   Allred, Jeneen Rinks, MD  Coenzyme Q10 (COQ10) 200 MG CAPS Take 200 mg by mouth in the morning.    [provider]  Continuous Blood Gluc Sensor (DEXCOM G7 SENSOR) MISC 1 Device by Does not apply route as directed. Change sensor every 10 days 07/17/21   Elayne Snare, MD  dapagliflozin propanediol (FARXIGA) 5 MG TABS tablet Take 1 tablet (5 mg total) by mouth daily. 07/16/21  Elayne Snare, MD  ENTRESTO 49-51 MG TAKE 1 TABLET BY MOUTH EVERY MORNING AND EVERY NIGHT AT BEDTIME. PLEASE KEEP UPCOMING APPT IN MARCH 2023 FOR MORE REFILLS. 05/29/21   Josue Hector, MD  ezetimibe (ZETIA) 10 MG tablet TAKE 1 TABLET(10 MG) BY MOUTH DAILY 08/03/21   Marin Olp, MD  glucose blood [facility] VERIO) test strip  1 each by Other route 2 (two) times daily. And lancets 2/day 10/25/16   Renato Shin, MD  insulin glargine, 1 Unit Dial, (TOUJEO SOLOSTAR) 300 UNIT/ML Solostar Pen Inject 38 Units into the skin daily. 38 units once daily 07/17/21   Elayne Snare, MD  insulin lispro (HUMALOG KWIKPEN) 100 UNIT/ML KwikPen 3 times a day (just before each meal) 7-3-7 units, and pen needles 4/day. 04/16/21   Renato Shin, MD  levothyroxine (SYNTHROID) 112 MCG tablet Take 1 tablet (112 mcg total) by mouth daily before breakfast. 07/17/21   Elayne Snare, MD  Probiotic Product (PROBIOTIC PO) Take 1 capsule by mouth every morning.    [provider]  rosuvastatin (CRESTOR) 10 MG tablet Take 1 tablet (10 mg total) by mouth daily. 04/05/21   Josue Hector, MD  tadalafil (CIALIS) 5 MG tablet TAKE 1 TABLET(5 MG) BY MOUTH DAILY 03/13/21   Marin Olp, MD                                                                                                                                    Allergies Patient has no known allergies.  Review of Systems Review of Systems  Constitutional:  Negative for fatigue and fever.  Respiratory:  Positive for shortness of breath. Negative for cough.   Cardiovascular:  Positive for chest pain.  Gastrointestinal:  Negative for nausea and vomiting.  Neurological:  Negative for headaches.   As noted in HPI  Physical Exam Vital Signs  I have reviewed the triage vital signs BP 106/66   Pulse 66   Temp 97.8 F (36.6 C) (Oral)   Resp 20   SpO2 95%   Physical Exam Vitals reviewed.  Constitutional:      General: He is not in acute distress.    Appearance: He is well-developed. He is not diaphoretic.  HENT:     Head: Normocephalic and atraumatic.     Nose: Nose normal.  Eyes:     General: No scleral icterus.       Right eye: No discharge.        Left eye: No discharge.     Conjunctiva/sclera: Conjunctivae normal.     Pupils: Pupils are equal, round, and reactive to light.   Cardiovascular:     Rate and Rhythm: Normal rate and regular rhythm.     Heart sounds: No murmur heard.    No friction rub. No gallop.  Pulmonary:     Effort: Pulmonary effort is normal. No respiratory distress.     Breath sounds: Normal  breath sounds. No stridor. No rales.  Chest:     Chest wall: No tenderness.    Abdominal:     General: There is no distension.     Palpations: Abdomen is soft.     Tenderness: There is no abdominal tenderness.  Musculoskeletal:        General: No tenderness.     Cervical back: Normal range of motion and neck supple.  Skin:    General: Skin is warm and dry.     Findings: No erythema or rash.  Neurological:     Mental Status: He is alert and oriented to person, place, and time.     ED Results and Treatments Labs (all labs ordered are listed, but only abnormal results are displayed) Labs Reviewed  BASIC METABOLIC PANEL - Abnormal; Notable for the following components:      Result Value   Glucose, Bld 118 (*)    Creatinine, Ser 1.29 (*)    Calcium 8.8 (*)    GFR, Estimated 60 (*)    All other components within normal limits  CBC - Abnormal; Notable for the following components:   WBC 10.7 (*)    All other components within normal limits  BRAIN NATRIURETIC PEPTIDE - Abnormal; Notable for the following components:   B Natriuretic Peptide 142.3 (*)    All other components within normal limits  D-DIMER, QUANTITATIVE - Abnormal; Notable for the following components:   D-Dimer, Quant 0.55 (*)    All other components within normal limits  I-STAT CHEM 8, ED - Abnormal; Notable for the following components:   Glucose, Bld 105 (*)    Calcium, Ion 1.12 (*)    All other components within normal limits  PROTIME-INR  I-STAT BETA HCG BLOOD, ED ([facility], [facility], [facility] ONLY)  TROPONIN I (HIGH SENSITIVITY)  TROPONIN I (HIGH SENSITIVITY)                                                                                                                         EKG   EKG Interpretation  Date/Time:  Sunday September 02 2021 02:45:05 EDT Ventricular Rate:  68 PR Interval:  190 QRS Duration: 152 QT Interval:  440 QTC Calculation: 467 R Axis:   53 Text Interpretation: Atrial-sensed ventricular-paced rhythm Abnormal ECG When compared with ECG of 10-Jan-2016 16:38, PREVIOUS ECG IS PRESENT Confirmed by Addison Lank [phone]) on 09/02/2021 3:55:37 AM       Radiology DG Chest 2 View  Result Date: 09/02/2021 CLINICAL DATA:  Left-sided chest pain and shortness of breath EXAM: CHEST - 2 VIEW COMPARISON:  01/10/2016 FINDINGS: Cardiac shadow is within normal limits. Defibrillator is again noted and stable. The lungs are well aerated bilaterally with mild scarring in the bases. No sizable effusion or focal infiltrate is noted. No bony abnormality is seen. IMPRESSION: Mild scarring in the bases bilaterally. No other focal abnormality is noted. Electronically Signed   By: Inez Catalina M.D.   On: 09/02/2021 03:27  Pertinent labs & imaging results that were available during my care of the patient were reviewed by me and considered in my medical decision making (see MDM for details).  Medications Ordered in ED Medications  fentaNYL (SUBLIMAZE) injection 50 mcg (has no administration in time range)  aspirin chewable tablet 243 mg (243 mg Oral Given 09/02/21 0427)  nitroGLYCERIN (NITROGLYN) 2 % ointment 1 inch (1 inch Topical Given 09/02/21 0427)  fentaNYL (SUBLIMAZE) injection 50 mcg (50 mcg Intravenous Given 09/02/21 0558)                                                                                                                                     Procedures Procedures  (including critical care time)  Medical Decision Making / ED Course    Complexity of Problem:  Co-morbidities/SDOH that complicate the patient evaluation/care: Noted above in HPI  Patient's presenting problem/concern, DDX, and MDM listed below: Left-sided chest pain Atypical for ACS given  patient's significant comorbidities will obtain cardiac work-up to rule it out. Also considering device complication. We will rule out CHF exacerbation. Low pretest probability for pulmonary embolism but unable to [facility].  Will obtain D-dimer. We will assess for pneumonia and pneumothorax with chest x-ray.  Hospitalization Considered:  Yes  Initial Intervention:  Aspirin and nitroglycerin paste    Complexity of Data:   Cardiac Monitoring: The patient was maintained on a cardiac monitor.   I personally viewed and interpreted the cardiac monitored which showed an underlying rhythm of paced rhythm in the 60s.  Patient is having infrequent PVCs EKG with atrial sensing ventricular paced rhythm  Laboratory Tests ordered listed below with my independent interpretation: CBC with mild leukocytosis.  No anemia No significant electrolyte derangements.  Stable renal function. Initial troponin negative.  Second troponin flat D-dimer slightly elevated but below age-adjusted level. Unlikely dissection or PE BNP less than 150   Imaging Studies ordered listed below with my independent interpretation: Chest x-ray without evidence of pneumonia, pneumothorax or pulmonary edema.  Patient does have mild scarring at the bases bilaterally.     ED Course:    Assessment, Add'l Intervention, and Reassessment: Left-sided stabbing chest pain Atypical for ACS and troponins have been flat. Unlikely PE, dissection, pneumothorax or pneumonia Still awaiting interrogation of device I consulted cardiology for evaluation and recommendations Patient care turned over to oncoming provider. Patient case and results discussed in detail; please see their note for further ED managment.    Final Clinical Impression(s) / ED Diagnoses Final diagnoses:  Chest pain at rest           This chart was dictated using voice recognition software.  Despite best efforts to proofread,  errors can occur which can change the  documentation meaning.      Fatima Blank, MD 09/02/21 2019

## 2021-09-02 NOTE — ED Notes (Signed)
This RN called [employer] back, as we have not heard anything from the rep as of yet.

## 2021-09-02 NOTE — H&P (Addendum)
Cardiology Admission History and Physical:   Patient ID: John Carrillo MRN: [id number]; DOB: 06-22-1950   Admission date: 09/02/2021  PCP:  Marin Olp, MD   [facility] Providers Cardiologist:  Jenkins Rouge, MD  Electrophysiologist:  Thompson Grayer, MD       Chief Complaint:  chest pain  Patient Profile:   John Carrillo is a 71 y.o. male with CAD status post anterior MI 2003 s/p TPA and rescue LAD PCI at [facility], ICM, LBBB, CVA 2016, HL, DM 2.  He declined ICD 2008.  Had SEMI 2016 with RCA intervention EF 15%, Readmitted 01/24/15 with right occipital lobe CVA placed on Brillinta and Eliquis. 2017 agreed to MDT CRTD device implant. He is being seen 09/02/2021 for the evaluation of Chest pain.  History of Present Illness:   John Carrillo was doing well when seen by Dr Johnsie Cancel 04/2021. He fell in January and in April, injuring L shoulder.   He was in [place] until yesterday afternoon. He developed pain lower L chest, sharp throbbing pain, 9/10 at first. Worse w/ deep inspiration, no N&V, diaphoresis. Worse w lying down. Improved w/ certain positions.  Later the pain radiated around towards his back, but the chest pain continued. Pain is worse w/ cough.  Also having pain where his neck meets his shoulder on the Left. Worse w/ bending over, better if he sits upright.   Sx concerned him so he came to the ER.   In the ER, sx improved by pain meds, nitro paste only gave him a H/A.   Right now, the pain is a 5/10, improved by pain meds.   It does remind of his pre-PCI pain, but he is relieved to know ez neg.    Past Medical History:  Diagnosis Date   Acute kidney injury superimposed on chronic kidney disease ([place]) 01/25/2015   AICD (automatic cardioverter/defibrillator) present    CHF (congestive heart failure) ([place])    CORONARY ARTERY DISEASE 08/11/2006   CVA 12/19/2006   2008   DIABETES MELLITUS, TYPE I 08/11/2006   ECZEMA, HANDS 12/15/2009   History of kidney stones    10+  years ago   HYPERLIPIDEMIA 08/11/2006   HYPERTENSION 08/11/2006   Hypothyroidism    Myocardial infarction [facility])    Presence of permanent cardiac pacemaker    Type 2 diabetes mellitus without complications ([place]) 16/55/3748    Past Surgical History:  Procedure Laterality Date   CARDIAC CATHETERIZATION N/A 01/16/2015   Procedure: Left Heart Cath and Coronary Angiography;  Surgeon: Lorretta Harp, MD;  Location: [place] [facility];  Service: Cardiovascular;  Laterality: N/A;   CORONARY STENT PLACEMENT     EP IMPLANTABLE DEVICE N/A 01/10/2016   MDT [place] MRI Quad CRTD implanted by Dr Rayann Heman   INGUINAL HERNIA REPAIR Left 05/22/2020   Procedure: LEFT INGUINAL HERNIA REPAIR WITH MESH;  Surgeon: Coralie Keens, MD;  Location: [place];  Service: General;  Laterality: Left;   INSERT / REPLACE / REMOVE PACEMAKER     INSERTION OF MESH Left 05/22/2020   Procedure: INSERTION OF MESH;  Surgeon: Coralie Keens, MD;  Location: [place];  Service: General;  Laterality: Left;   TEE WITHOUT CARDIOVERSION N/A 01/26/2015   Procedure: TRANSESOPHAGEAL ECHOCARDIOGRAM (TEE);  Surgeon: Sanda Klein, MD;  Location: [facility] ENDOSCOPY;  Service: Cardiovascular;  Laterality: N/A;   TONSILECTOMY, ADENOIDECTOMY, BILATERAL MYRINGOTOMY AND TUBES     TONSILLECTOMY     WISDOM TOOTH EXTRACTION     WRIST FRACTURE SURGERY Right  1960   3rd grade     Medications Prior to Admission: Prior to Admission medications   Medication Sig Start Date End Date Taking? Authorizing Provider  acetaminophen (TYLENOL) 325 MG tablet Take 2 tablets (650 mg total) by mouth every 4 (four) hours as needed for headache or mild pain. 01/17/15   Erlene Quan, PA-C  Blood Glucose Monitoring Suppl (ONETOUCH VERIO) w/Device KIT 1 Device by Does not apply route daily. 10/02/20   Renato Shin, MD  carvedilol (COREG) 6.25 MG tablet TAKE 1 TABLET(6.25 MG) BY MOUTH TWICE DAILY 01/02/21   Shirley Friar, PA-C  clopidogrel (PLAVIX) 75 MG tablet TAKE 1  TABLET(75 MG) BY MOUTH DAILY 04/11/21   Allred, Jeneen Rinks, MD  Coenzyme Q10 (COQ10) 200 MG CAPS Take 200 mg by mouth in the morning.    [provider]  Continuous Blood Gluc Sensor (DEXCOM G7 SENSOR) MISC 1 Device by Does not apply route as directed. Change sensor every 10 days 07/17/21   Elayne Snare, MD  dapagliflozin propanediol (FARXIGA) 5 MG TABS tablet Take 1 tablet (5 mg total) by mouth daily. 07/16/21   Elayne Snare, MD  ENTRESTO 49-51 MG TAKE 1 TABLET BY MOUTH EVERY MORNING AND EVERY NIGHT AT BEDTIME. PLEASE KEEP UPCOMING APPT IN MARCH 2023 FOR MORE REFILLS. 05/29/21   Josue Hector, MD  ezetimibe (ZETIA) 10 MG tablet TAKE 1 TABLET(10 MG) BY MOUTH DAILY 08/03/21   Marin Olp, MD  glucose blood [facility] VERIO) test strip 1 each by Other route 2 (two) times daily. And lancets 2/day 10/25/16   Renato Shin, MD  insulin glargine, 1 Unit Dial, (TOUJEO SOLOSTAR) 300 UNIT/ML Solostar Pen Inject 38 Units into the skin daily. 38 units once daily 07/17/21   Elayne Snare, MD  insulin lispro (HUMALOG KWIKPEN) 100 UNIT/ML KwikPen 3 times a day (just before each meal) 7-3-7 units, and pen needles 4/day. 04/16/21   Renato Shin, MD  levothyroxine (SYNTHROID) 112 MCG tablet Take 1 tablet (112 mcg total) by mouth daily before breakfast. 07/17/21   Elayne Snare, MD  Probiotic Product (PROBIOTIC PO) Take 1 capsule by mouth every morning.    [provider]  rosuvastatin (CRESTOR) 10 MG tablet Take 1 tablet (10 mg total) by mouth daily. 04/05/21   Josue Hector, MD  tadalafil (CIALIS) 5 MG tablet TAKE 1 TABLET(5 MG) BY MOUTH DAILY 03/13/21   Marin Olp, MD     Allergies:   No Known Allergies  Social History:   Social History   Socioeconomic History   Marital status: Married    Spouse name: Not on file   Number of children: Not on file   Years of education: Not on file   Highest education level: Not on file  Occupational History   Occupation: Educational psychologist    Employer: [employer]  Tobacco Use   Smoking status: Former    Packs/day: 1.00    Years: 30.00    Total pack years: 30.00    Types: Cigarettes    Quit date: 05/03/2000    Years since quitting: 21.3   Smokeless tobacco: Never  Vaping Use   Vaping Use: Never used  Substance and Sexual Activity   Alcohol use: Yes    Alcohol/week: 3.0 standard drinks of alcohol    Types: 1 Glasses of wine, 1 Cans of beer, 1 Shots of liquor per week    Comment: 1-2/month   Drug use: No   Sexual activity: Not on file  Other Topics Concern   Not on file  Social History Narrative   Married (kids are patients here, wife seen elsewhere). 3 step children, 1 biological. No grandkids.    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 also wants to do grad school   Oldest daughter Ledell Noss - college of Nature conservation officer- social work intl business minor religion- now trying to get foot in door at [employer]- wants to be in Merchandiser, retail for general dynamics- consider age 89 retirement      Hobbies: race sail boats, adrenaline related activities   Social Determinants of Radio broadcast assistant Strain: Not on Art therapist Insecurity: Not on file  Transportation Needs: Not on file  Physical Activity: Not on file  Stress: Not on file  Social Connections: Not on file  Intimate Partner Violence: Not on file    Family History:   The patient's family history includes Brain cancer in his maternal grandmother; CAD in his father, paternal grandfather, and paternal grandmother; Celiac disease in his sister; Dementia in his mother; Lymphoma in his sister; Other in his mother. There is no history of Diabetes.    ROS:  Please see the history of present illness.  All other ROS reviewed and negative.     Physical Exam/Data:   Vitals:   09/02/21 0615 09/02/21 0630 09/02/21 0645 09/02/21 0700  BP: 103/60 102/63 103/71 106/66  Pulse: 64 74 64 66  Resp: 14 20 16 20   Temp:       TempSrc:      SpO2: 94% 92% 94% 95%   No intake or output data in the 24 hours ending 09/02/21 0733    07/17/2021    8:21 AM 04/25/2021    8:44 AM 04/16/2021    7:48 AM  Last 3 Weights  Weight (lbs) 175 lb 9.6 oz 176 lb 178 lb 3.2 oz  Weight (kg) 79.652 kg 79.833 kg 80.831 kg     There is no height or weight on file to calculate BMI.  General:  Well nourished, well developed, male appears uncomfortable HEENT: normal Neck: no JVD Vascular: No carotid bruits; Distal pulses 2+ bilaterally   Cardiac:  normal S1, S2; RRR; soft murmur  Lungs: Decreased breath sounds bases with a few Rales bilaterally, no wheezing, rhonchi  Abd: soft, nontender, no hepatomegaly  Ext: no edema Musculoskeletal:  No deformities, BUE and BLE strength normal and equal Skin: warm and dry  Neuro:  CNs 2-12 intact, no focal abnormalities noted Psych:  Normal affect    EKG:  The ECG that was done today was personally reviewed and demonstrates sinus rhythm with ventricular pacing, QRS duration 152 ms (previous ECG QRS duration 120 ms)  Relevant CV Studies:  ECHO: 08/15/2017 - Left ventricle: The cavity size was moderately dilated. There was    moderate focal basal and mild concentric hypertrophy. Systolic    function was moderately to severely reduced. The estimated    ejection fraction was in the range of 30% to 35%. Features are    consistent with a pseudonormal left ventricular filling pattern,    with concomitant abnormal relaxation and increased filling    pressure (grade 2 diastolic dysfunction).  - Regional wall motion abnormality: Akinesis of the mid-apical    anterior, mid anteroseptal, mid inferoseptal, mid-apical    inferior, apical septal, and apical myocardium; hypokinesis of    the basal anterior, basal  anteroseptal, basal inferoseptal, basal    inferior, basal-mid inferolateral, basal-mid anterolateral, and    apical lateral myocardium.  - Aorta: Ascending aortic diameter: 40 mm (S).  -  Ascending aorta: The ascending aorta was mildly dilated.  - Left atrium: The atrium was mildly dilated.  - Right ventricle: The cavity size was moderately dilated. Wall    thickness was normal.  - Atrial septum: No defect or patent foramen ovale was identified.  - Pericardium, extracardiac: A trivial pericardial effusion was    identified.    CARDIAC CATH: 01/16/2015 Mid RCA to Dist RCA lesion, 95% stenosed. Post intervention, there is a 0% residual stenosis. There is severe left ventricular systolic dysfunction.   Diagnostic Dominance: Right  Intervention     Laboratory Data:  High Sensitivity Troponin:   Recent Labs  Lab 09/02/21 0258 09/02/21 0449  TROPONINIHS 16 15      Chemistry Recent Labs  Lab 09/02/21 0258 09/02/21 0409  NA 140 141  K 3.7 3.6  CL 109 107  CO2 24  --   GLUCOSE 118* 105*  BUN 16 19  CREATININE 1.29* 1.20  CALCIUM 8.8*  --   GFRNONAA 60*  --   ANIONGAP 7  --     No results for input(s): "PROT", "ALBUMIN", "AST", "ALT", "ALKPHOS", "BILITOT" in the last 168 hours. Lipids No results for input(s): "CHOL", "TRIG", "HDL", "LABVLDL", "LDLCALC", "CHOLHDL" in the last 168 hours. Hematology Recent Labs  Lab 09/02/21 0258 09/02/21 0409  WBC 10.7*  --   RBC 4.60  --   HGB 15.6 15.6  HCT 46.0 46.0  MCV 100.0  --   MCH 33.9  --   MCHC 33.9  --   RDW 12.9  --   PLT 162  --    Thyroid No results for input(s): "TSH", "FREET4" in the last 168 hours. BNP Recent Labs  Lab 09/02/21 0258  BNP 142.3*    DDimer  Recent Labs  Lab 09/02/21 0258  DDIMER 0.55*     Radiology/Studies:  DG Chest 2 View  Result Date: 09/02/2021 CLINICAL DATA:  Left-sided chest pain and shortness of breath EXAM: CHEST - 2 VIEW COMPARISON:  01/10/2016 FINDINGS: Cardiac shadow is within normal limits. Defibrillator is again noted and stable. The lungs are well aerated bilaterally with mild scarring in the bases. No sizable effusion or focal infiltrate is noted. No  bony abnormality is seen. IMPRESSION: Mild scarring in the bases bilaterally. No other focal abnormality is noted. Electronically Signed   By: Inez Catalina M.D.   On: 09/02/2021 03:27     Assessment and Plan:   Chest pain: -Pain is pleuritic in nature and cardiac enzymes are negative for MI - Nitroglycerin paste was no help, pain medications have improved symptoms - ECG is unchanged due to LV pacing - he is on Plavix alone, discuss w/ MD if we should add colchicine (and possibly high-dose NSAIDs for short course) -He has not had an ischemic evaluation since his cath in 2016 -No echo since 2019 - Check echo, if his EF is improved, discussed with MD if inpatient ischemic eval is needed, or follow-up with Dr Johnsie Cancel to decide on testing  2.  Ischemic cardiomyopathy - He says he has gained about 9 pounds in the last couple of months, but feels it is related to changes in his insulin medications - He has some dyspnea on exertion that is mostly chronic but may be a little worse - He has not had lower  extremity edema, no orthopnea or PND - BNP is only mildly abnormal, chest x-ray without edema - Continue current therapy  3. S/p MDT CRT-D -He was 98% BiV pacing in June - Device interrogation pending - His QRS morphology has changed and the width is significantly increased from his last ECG - Decide on further actions once device interrogation reviewed.  4. Diabetes, hyperlipidemia and other chronic medical problems, continue home medications.   Risk Assessment/Risk Scores:    HEAR Score (for undifferentiated chest pain):   Unable to obtain  New York Heart Association (NYHA) Functional Class NYHA Class II    For questions or updates, please contact [facility] Please consult [email or web address] for contact info under     Signed, Rosaria Ferries, PA-C  09/02/2021 7:33 AM    I have seen and examined the patient along with Rosaria Ferries, PA-C .  I have reviewed the chart, notes and new  data.  I agree with PA/NP's note.  Key new complaints: Chest discomfort is clearly pleuritic and is also positional.  It is localized in the lower left chest, radiating towards the back and also felt at the base of his neck, suggesting may be radiation in the phrenic nerve distribution.  He describes it as reminiscent of his discomfort before his infarction, but he also reports that that felt like a blow to the chest which she has not experienced at this time.  He has a mild nonproductive cough for a few days but has not had flulike illness, fever or chills.  He is not dyspneic.  He has no edema.  He does not have orthopnea or PND. Key examination changes: Clinically he appears euvolemic.  There are no pleural or pericardial rubs.  There is no jugular venous distention or edema or lung rales other than some atelectatic crackles in the base of the lungs that clear with cough.  Rhythm is regular with occasional ectopic beats.  The defibrillator site appears healthy.  Abdominal exam is benign. Key new findings / data: I interrogated his device personally.  The device is functioning normally.  Estimated generator longevity is over 2 years.  All lead parameters are within normal range.  There is greater than 98% biventricular pacing efficiency.  OptiVol has not crossed the threshold in 12 months.  He has not had any recent atrial fibrillation or episodes of ventricular tachycardia, even any nonsustained episodes.  Evaluation of his ECG shows an embryonal R wave in lead V1 (during device interrogation this is what the QRS complex looks like during biventricular pacing).  The previous ECG shows a very distinct positive R wave in lead V1 which is what his QRS complex looks like during LV pacing only.  Both tracings are very different from baseline ECG with left bundle branch block and very broad QRS, that he had before device implantation (when QRS was over 160 ms). Cardiac enzymes are normal despite several hours of  pain.  BNP is barely abnormal, probably at his baseline.  D-dimer is at the upper limit of normal, probably within normal range for his age.  PLAN: Pleuritic syndrome, suggesting acute pericarditis or pleuritis. Differential diagnosis also includes pneumonia or pulmonary infarction, neither 1 of which is apparent on the chest x-ray.  D-dimer does not support pulmonary embolism.  WBC is borderline elevated, nonspecific. No other symptoms to suggest pulmonary embolism.  He has not been recently immobilized.  No clinical signs of DVT.  No pleural effusion on chest x-ray. Check inflammatory markers and  echocardiogram. Start colchicine and analgesics.  Try to avoid NSAIDs if possible due to chronic antiplatelet therapy.  Consider a short course of steroids.  Sanda Klein, MD, [place] [phone] 09/02/2021, 9:42 AM

## 2021-09-02 NOTE — ED Notes (Signed)
Med[employer]onic interrogator equipment is having problems, [employer] rep contacted.

## 2021-09-02 NOTE — Progress Notes (Signed)
  Echocardiogram 2D Echocardiogram has been performed.  Delcie Roch 09/02/2021, 5:04 PM

## 2021-09-02 NOTE — ED Notes (Signed)
CP currently at 6/10

## 2021-09-03 ENCOUNTER — Other Ambulatory Visit: Payer: Self-pay | Admitting: Physician Assistant

## 2021-09-03 ENCOUNTER — Telehealth: Payer: Self-pay | Admitting: Physician Assistant

## 2021-09-03 ENCOUNTER — Telehealth (HOSPITAL_COMMUNITY): Payer: Self-pay | Admitting: Pharmacy Technician

## 2021-09-03 ENCOUNTER — Other Ambulatory Visit (HOSPITAL_COMMUNITY): Payer: Self-pay

## 2021-09-03 DIAGNOSIS — R079 Chest pain, unspecified: Secondary | ICD-10-CM | POA: Diagnosis not present

## 2021-09-03 LAB — GLUCOSE, CAPILLARY: Glucose-Capillary: 277 mg/dL — ABNORMAL HIGH (ref 70–99)

## 2021-09-03 LAB — HIV ANTIBODY (ROUTINE TESTING W REFLEX): HIV Screen 4th Generation wRfx: NONREACTIVE

## 2021-09-03 MED ORDER — COLCHICINE 0.6 MG PO TABS: 0.6000 mg | ORAL_TABLET | Freq: Every day | ORAL | 0 refills | Status: DC

## 2021-09-03 MED ORDER — PREDNISONE 20 MG PO TABS: 40.0000 mg | ORAL_TABLET | Freq: Every day | ORAL | 0 refills | Status: DC

## 2021-09-03 NOTE — TOC Benefit Eligibility Note (Signed)
Patient Scientific laboratory technician completed.     The patient is currently admitted and upon discharge could be taking Colchicine 0.6mg .   The current 30 day co-pay is, $11.70.   The patient is insured through [facility].

## 2021-09-03 NOTE — Care Management Obs Status (Signed)
MEDICARE OBSERVATION STATUS NOTIFICATION   Patient Details  Name: VERLE WHEELING MRN: [id number] Date of Birth: February 01, 1951   Medicare Observation Status Notification Given:  Yes    Gala Lewandowsky, RN 09/03/2021, 10:26 AM

## 2021-09-03 NOTE — Progress Notes (Addendum)
Progress Note  Patient Name: John Carrillo Date of Encounter: 09/03/2021  [facility] Cardiologist: Jenkins Rouge, MD   Subjective   Denies any CP or SOB.   Inpatient Medications    Scheduled Meds:  acidophilus  1 capsule Oral Daily   carvedilol  6.25 mg Oral BID WC   clopidogrel  75 mg Oral Daily   colchicine  0.6 mg Oral Daily   dapagliflozin propanediol  5 mg Oral Daily   enoxaparin (LOVENOX) injection  40 mg Subcutaneous Daily   ezetimibe  10 mg Oral Daily   insulin aspart  0-15 Units Subcutaneous TID WC   insulin aspart  0-5 Units Subcutaneous QHS   insulin glargine-yfgn  30 Units Subcutaneous QHS   levothyroxine  112 mcg Oral Q0600   predniSONE  40 mg Oral Q breakfast   rosuvastatin  10 mg Oral Daily   sacubitril-valsartan  1 tablet Oral BID   Continuous Infusions:  PRN Meds: acetaminophen, ALPRAZolam, fentaNYL (SUBLIMAZE) injection, HYDROcodone-acetaminophen, nitroGLYCERIN, ondansetron (ZOFRAN) IV, zolpidem   Vital Signs    Vitals:   09/02/21 1620 09/02/21 2143 09/03/21 0004 09/03/21 0651  BP: 107/67 115/72 129/84 140/77  Pulse: 86 61 62 63  Resp: 18 16 18 16   Temp:  97.6 F (36.4 C) 98 F (36.7 C) 98 F (36.7 C)  TempSrc:  Oral Oral Oral  SpO2: 92% 94% 94% 95%  Weight:       No intake or output data in the 24 hours ending 09/03/21 0757    09/02/2021    3:11 PM 07/17/2021    8:21 AM 04/25/2021    8:44 AM  Last 3 Weights  Weight (lbs) 183 lb 175 lb 9.6 oz 176 lb  Weight (kg) 83.008 kg 79.652 kg 79.833 kg      Telemetry    Paced rhythm - Personally Reviewed  ECG    Atrial sensed and ventricularly paced rhythm - Personally Reviewed  Physical Exam   GEN: No acute distress.   Neck: No JVD Cardiac: RRR, no murmurs, rubs, or gallops.  Respiratory: Clear to auscultation bilaterally. GI: Soft, nontender, non-distended  MS: No edema; No deformity. Neuro:  Nonfocal  Psych: Normal affect   Labs    High Sensitivity Troponin:   Recent  Labs  Lab 09/02/21 0258 09/02/21 0449  TROPONINIHS 16 15     Chemistry Recent Labs  Lab 09/02/21 0258 09/02/21 0409  NA 140 141  K 3.7 3.6  CL 109 107  CO2 24  --   GLUCOSE 118* 105*  BUN 16 19  CREATININE 1.29* 1.20  CALCIUM 8.8*  --   GFRNONAA 60*  --   ANIONGAP 7  --     Lipids No results for input(s): "CHOL", "TRIG", "HDL", "LABVLDL", "LDLCALC", "CHOLHDL" in the last 168 hours.  Hematology Recent Labs  Lab 09/02/21 0258 09/02/21 0409  WBC 10.7*  --   RBC 4.60  --   HGB 15.6 15.6  HCT 46.0 46.0  MCV 100.0  --   MCH 33.9  --   MCHC 33.9  --   RDW 12.9  --   PLT 162  --    Thyroid No results for input(s): "TSH", "FREET4" in the last 168 hours.  BNP Recent Labs  Lab 09/02/21 0258  BNP 142.3*    DDimer  Recent Labs  Lab 09/02/21 0258  DDIMER 0.55*     Radiology    ECHOCARDIOGRAM COMPLETE  Result Date: 09/02/2021    ECHOCARDIOGRAM REPORT  Patient Name:   John Carrillo Date of Exam: 09/02/2021 Medical Rec #:  [id number]        Height:       69.0 in Accession #:    [id number]       Weight:       183.0 lb Date of Birth:  1950/09/13        BSA:          1.989 m Patient Age:    71 years         BP:           120/77 mmHg Patient Gender: M                HR:           77 bpm. Exam Location:  Inpatient Procedure: 2D Echo Indications:    chest pain  History:        Patient has prior history of Echocardiogram examinations, most                 recent 08/15/2017. CAD, Defibrillator and Pacemaker, chronic                 kidney disease, Arrythmias:LBBB; Risk Factors:Dyslipidemia,                 Hypertension and Diabetes.  Sonographer:    Johny Chess RDCS Referring Phys: [place]  1. There is thinning and dyskinesis of the left ventricular apex, consistent with postinfarction scar. No left ventricular thrombus is seen. Left ventricular ejection fraction, by estimation, is 35 to 40%. The left ventricle has moderately decreased function. The left  ventricle demonstrates regional wall motion abnormalities (see scoring diagram/findings for description). Left ventricular diastolic parameters are consistent with Grade I diastolic dysfunction (impaired relaxation). There remains LBBB-related dyssynchrony despite CRT, making wall motion analysis less confident.  2. Right ventricular systolic function is normal. The right ventricular size is normal. There is normal pulmonary artery systolic pressure.  3. Left atrial size was mildly dilated.  4. The mitral valve is normal in structure. No evidence of mitral valve regurgitation.  5. The aortic valve is tricuspid. Aortic valve regurgitation is not visualized. No aortic stenosis is present.  6. The inferior vena cava is normal in size with greater than 50% respiratory variability, suggesting right atrial pressure of 3 mmHg. Comparison(s): No significant change from prior study. Prior images reviewed side by side. The left ventricular function is unchanged. The left ventricular wall motion abnormalities are unchanged. The images from 2019 were not avaialable for review. Compared to the 2018 study. FINDINGS  Left Ventricle: There is thinning and dyskinesis of the left ventricular apex, consistent with postinfarction scar. No left ventricular thrombus is seen. Left ventricular ejection fraction, by estimation, is 35 to 40%. The left ventricle has moderately decreased function. The left ventricle demonstrates regional wall motion abnormalities. The left ventricular internal cavity size was normal in size. There is no left ventricular hypertrophy. Abnormal (paradoxical) septal motion, consistent with left bundle branch block. Left ventricular diastolic parameters are consistent with Grade I diastolic dysfunction (impaired relaxation). Normal left ventricular filling pressure.  LV Wall Scoring: The apical septal segment, apical anterior segment, apical inferior segment, and apex are dyskinetic. The mid anteroseptal segment  and apical lateral segment are hypokinetic. The anterior wall, antero-lateral wall, inferior wall, posterior wall, basal anteroseptal segment, mid inferoseptal segment, and basal inferoseptal segment are normal. There remains LBBB-related dyssynchrony despite CRT, making wall motion analysis  less confident. Right Ventricle: The right ventricular size is normal. No increase in right ventricular wall thickness. Right ventricular systolic function is normal. There is normal pulmonary artery systolic pressure. The tricuspid regurgitant velocity is 2.14 m/s, and  with an assumed right atrial pressure of 3 mmHg, the estimated right ventricular systolic pressure is 16.9 mmHg. Left Atrium: Left atrial size was mildly dilated. Right Atrium: Right atrial size was normal in size. Pericardium: There is no evidence of pericardial effusion. Mitral Valve: The mitral valve is normal in structure. No evidence of mitral valve regurgitation. Tricuspid Valve: The tricuspid valve is normal in structure. Tricuspid valve regurgitation is trivial. Aortic Valve: The aortic valve is tricuspid. Aortic valve regurgitation is not visualized. No aortic stenosis is present. Pulmonic Valve: The pulmonic valve was grossly normal. Pulmonic valve regurgitation is trivial. Aorta: The aortic root and ascending aorta are structurally normal, with no evidence of dilitation. Venous: The inferior vena cava is normal in size with greater than 50% respiratory variability, suggesting right atrial pressure of 3 mmHg. IAS/Shunts: No atrial level shunt detected by color flow Doppler. Additional Comments: A device lead is visualized in the right ventricle.  LEFT VENTRICLE PLAX 2D LVIDd:         5.00 cm      Diastology LVIDs:         3.70 cm      LV e' medial:    6.64 cm/s LV PW:         1.10 cm      LV E/e' medial:  8.2 LV IVS:        1.10 cm      LV e' lateral:   5.77 cm/s LVOT diam:     2.10 cm      LV E/e' lateral: 9.4 LV SV:         72 LV SV Index:   36 LVOT  Area:     3.46 cm  LV Volumes (MOD) LV vol d, MOD A4C: 113.0 ml LV vol s, MOD A4C: 79.9 ml LV SV MOD A4C:     113.0 ml RIGHT VENTRICLE             IVC RV Basal diam:  2.50 cm     IVC diam: 1.50 cm RV S prime:     12.30 cm/s LEFT ATRIUM             Index        RIGHT ATRIUM           Index LA diam:        4.00 cm 2.01 cm/m   RA Area:     12.90 cm LA Vol (A2C):   61.9 ml 31.11 ml/m  RA Volume:   30.00 ml  15.08 ml/m LA Vol (A4C):   45.7 ml 22.97 ml/m LA Biplane Vol: 57.4 ml 28.85 ml/m  AORTIC VALVE LVOT Vmax:   113.00 cm/s LVOT Vmean:  72.100 cm/s LVOT VTI:    0.207 m  AORTA Ao Root diam: 3.60 cm Ao Asc diam:  3.70 cm MITRAL VALVE               TRICUSPID VALVE MV Area (PHT): 4.49 cm    TR Peak grad:   18.3 mmHg MV Decel Time: 169 msec    TR Vmax:        214.00 cm/s MV E velocity: 54.30 cm/s MV A velocity: 93.50 cm/s  SHUNTS MV E/A ratio:  0.58  Systemic VTI:  0.21 m                            Systemic Diam: 2.10 cm Sanda Klein MD Electronically signed by Sanda Klein MD Signature Date/Time: 09/02/2021/5:18:17 PM    Final    DG Chest 2 View  Result Date: 09/02/2021 CLINICAL DATA:  Left-sided chest pain and shortness of breath EXAM: CHEST - 2 VIEW COMPARISON:  01/10/2016 FINDINGS: Cardiac shadow is within normal limits. Defibrillator is again noted and stable. The lungs are well aerated bilaterally with mild scarring in the bases. No sizable effusion or focal infiltrate is noted. No bony abnormality is seen. IMPRESSION: Mild scarring in the bases bilaterally. No other focal abnormality is noted. Electronically Signed   By: Inez Catalina M.D.   On: 09/02/2021 03:27    Cardiac Studies   Echo 09/02/2021 1. There is thinning and dyskinesis of the left ventricular apex,  consistent with postinfarction scar. No left ventricular thrombus is seen.  Left ventricular ejection fraction, by estimation, is 35 to 40%. The left  ventricle has moderately decreased  function. The left ventricle demonstrates  regional wall motion  abnormalities (see scoring diagram/findings for description). Left  ventricular diastolic parameters are consistent with Grade I diastolic  dysfunction (impaired relaxation). There remains  LBBB-related dyssynchrony despite CRT, making wall motion analysis less  confident.   2. Right ventricular systolic function is normal. The right ventricular  size is normal. There is normal pulmonary artery systolic pressure.   3. Left atrial size was mildly dilated.   4. The mitral valve is normal in structure. No evidence of mitral valve  regurgitation.   5. The aortic valve is tricuspid. Aortic valve regurgitation is not  visualized. No aortic stenosis is present.   6. The inferior vena cava is normal in size with greater than 50%  respiratory variability, suggesting right atrial pressure of 3 mmHg.   Comparison(s): No significant change from prior study. Prior images  reviewed side by side. The left ventricular function is unchanged. The  left ventricular wall motion abnormalities are unchanged. The images from  2019 were not avaialable for review.  Compared to the 2018 study.   Patient Profile     71 y.o. male with PMH of CAD s/p anterior MI 2003 s/p TPA and LAD PCI, ICM, LBBB, CVA 2016, HLD and DM II. He underwent RCA intervention in 2016. Readmitted in 01/2015 with R occipital lobe CVA placed on Brilinta and Eliquis. He initially declined ICD but later agreed to undergo MDT CRT-D in 2017. He presented with pleuritic chest pain. Started colchicine and analgesics.   Assessment & Plan    Pleuritic chest pain  - serial trop 16 --> 15  - started on short course of steroid and colchicine.   - came in with chest pain and left shoulder pain that were sharp and worse with deep inspiration. Recently moved some furnitures, left shoulder and chest pain resolved, now has right shoulder pain.   - chest pain appears to be very atypical, given reassuring echo, would not recommend  further work up. Will discuss with MD, otherwise, plans for discharge.   CAD: had anterior MI 2003 s/p TPA and LAD PCI  Ischemic cardiomyopathy: EF 15% in Dec 2016, improved to 30-35% on last echo in July 2019. Repeat echo obtained on 7/30 showed EF 30-40%, grade 1 DD, LBBB related dyssynchrony.   HLD  DM II  H/o CVA  2016  For questions or updates, please contact [facility] Please consult [email or web address] for contact info under        Signed, Almyra Deforest, [place]  09/03/2021, 7:57 AM    Patient seen and examined with Almyra Deforest PA.  Agree as above, with the following exceptions and changes as noted below.  Presentation discussed with the patient, he notes significant heavy lifting and likely musculoskeletal pain afterward.  ESR normal but CRP mildly elevated.  Some features suggestive of pericarditis however most likely this represents musculoskeletal chest pain.  Left sided pain has completely resolved with 1 dose of prednisone and initiation of colchicine.  He does now have right-sided tender to palpation discomfort over his superior aspect of the trapezius.  He is eager to return to exercising.  It would be reasonable for him to walk at a moderate pace however I have discouraged any upper body weight lifting and have discouraged heavy exertion if he does in fact have pericarditis.  Risks discussed and patient acknowledged. Gen: NAD, CV: RRR, no murmurs, no rub lungs: clear, Abd: soft, Extrem: Warm, well perfused, no edema, Neuro/Psych: alert and oriented x 3, normal mood and affect. All available labs, radiology testing, previous records reviewed.  Patient feels comfortable with the plan and we will plan for next available follow-up as an outpatient to review symptoms once again.  Reasonable to finish prednisone burst for musculoskeletal pain initiated by prior team, and he would like to remain on colchicine if he continues to tolerate it well at least until follow-up.  Generally I continue colchicine  for pericarditis for 3 months to fully treat inflammation, however his diagnosis is equivocal for pericarditis and it may be reasonable to continue this for 2 to 4 weeks pending symptom resolution.  No NSAIDs.  Patient agrees that he is stable for hospital discharge today.  Eager to go home.  Elouise Munroe, MD 09/03/21 9:32 AM

## 2021-09-03 NOTE — Telephone Encounter (Signed)
Pharmacy Patient Advocate Encounter  Insurance verification completed.    The patient is insured through [facility]   The patient is currently admitted and ran test claims for the following: Colchicine 0.6mg .  Copays and coinsurance results were relayed to Inpatient clinical team.

## 2021-09-03 NOTE — Telephone Encounter (Signed)
Pt is schedule with Herma Carson on 08/08 at 1:45 p.m. TOC

## 2021-09-03 NOTE — Discharge Summary (Signed)
Discharge Summary    Patient ID: John Carrillo MRN: [id number]; DOB: 02-04-1951  Admit date: 09/02/2021 Discharge date: 09/03/2021  PCP:  Marin Olp, MD   [facility] Providers Cardiologist:  Jenkins Rouge, MD  Electrophysiologist:  Thompson Grayer, MD       Discharge Diagnoses    Principal Problem:   Chest pain with moderate risk for cardiac etiology    Diagnostic Studies/Procedures    Echo 09/02/2021 1. There is thinning and dyskinesis of the left ventricular apex,  consistent with postinfarction scar. No left ventricular thrombus is seen.  Left ventricular ejection fraction, by estimation, is 35 to 40%. The left  ventricle has moderately decreased  function. The left ventricle demonstrates regional wall motion  abnormalities (see scoring diagram/findings for description). Left  ventricular diastolic parameters are consistent with Grade I diastolic  dysfunction (impaired relaxation). There remains  LBBB-related dyssynchrony despite CRT, making wall motion analysis less  confident.   2. Right ventricular systolic function is normal. The right ventricular  size is normal. There is normal pulmonary artery systolic pressure.   3. Left atrial size was mildly dilated.   4. The mitral valve is normal in structure. No evidence of mitral valve  regurgitation.   5. The aortic valve is tricuspid. Aortic valve regurgitation is not  visualized. No aortic stenosis is present.   6. The inferior vena cava is normal in size with greater than 50%  respiratory variability, suggesting right atrial pressure of 3 mmHg.   Comparison(s): No significant change from prior study. Prior images  reviewed side by side. The left ventricular function is unchanged. The  left ventricular wall motion abnormalities are unchanged. The images from  2019 were not avaialable for review.  Compared to the 2018 study.  _____________   History of Present Illness     John Carrillo is a 71 y.o.  male with CAD status post anterior MI 2003 s/p TPA and rescue LAD PCI at [facility], ICM, LBBB, CVA 2016, HL, DM 2.  He declined ICD 2008.  Had SEMI 2016 with RCA intervention EF 15%, Readmitted 01/24/15 with right occipital lobe CVA placed on Brillinta and Eliquis. 2017 agreed to MDT CRTD device implant. He is being seen 09/02/2021 for the evaluation of Chest pain.  Mr. Dingley was doing well when seen by Dr Johnsie Cancel 04/2021. He fell in January and in April, injuring L shoulder.    He was in [place] until yesterday afternoon. He developed pain lower L chest, sharp throbbing pain, 9/10 at first. Worse w/ deep inspiration, no N&V, diaphoresis. Worse w lying down. Improved w/ certain positions.   Later the pain radiated around towards his back, but the chest pain continued. Pain is worse w/ cough.   Also having pain where his neck meets his shoulder on the Left. Worse w/ bending over, better if he sits upright.    Sx concerned him so he came to the ER.    In the ER, sx improved by pain meds, nitro paste only gave him a H/A.    Right now, the pain is a 5/10, improved by pain meds.    It does remind of his pre-PCI pain, but he is relieved to know ez neg.   Hospital Course     Consultants: N/A   Patient was admitted to cardiology service.  Serial troponin were negative x2.  He was treated with a short course of prednisone 40 mg and colchicine. Repeat echocardiogram obtained on 09/02/2021 showed EF 35  to to 40%, thinning and dyskinesis of the LV apex consistent with postinfarction scar, grade 1 DD, LBBB related desynchrony.  No significant change when compared to the previous study.  Patient was seen in the morning of 09/03/2021 at which time his chest pain has completely resolved.  He no longer has left shoulder pain, he does have right shoulder pain instead.  Patient was seen by cardiology service and felt to be stable for discharge from the cardiac perspective.  His symptom is atypical for pericarditis,  therefore our plan is to continue colchicine for 2 to 4 weeks pending symptom resolution.  He will finish a total of 5 days of prednisone burst for musculoskeletal pain.  He was given 3 more days of prednisone upon discharge.    Did the patient have an acute coronary syndrome (MI, NSTEMI, STEMI, etc) this admission?:  No                               Did the patient have a percutaneous coronary intervention (stent / angioplasty)?:  No.          _____________  Discharge Vitals Blood pressure 108/74, pulse 64, temperature (!) 97.4 F (36.3 C), temperature source Oral, resp. rate 17, weight 83 kg, SpO2 93 %.  Filed Weights   09/02/21 1511  Weight: 83 kg    Labs & Radiologic Studies    CBC Recent Labs    09/02/21 0258 09/02/21 0409  WBC 10.7*  --   HGB 15.6 15.6  HCT 46.0 46.0  MCV 100.0  --   PLT 162  --    Basic Metabolic Panel Recent Labs    09/02/21 0258 09/02/21 0409  NA 140 141  K 3.7 3.6  CL 109 107  CO2 24  --   GLUCOSE 118* 105*  BUN 16 19  CREATININE 1.29* 1.20  CALCIUM 8.8*  --    Liver Function Tests No results for input(s): "AST", "ALT", "ALKPHOS", "BILITOT", "PROT", "ALBUMIN" in the last 72 hours. No results for input(s): "LIPASE", "AMYLASE" in the last 72 hours. High Sensitivity Troponin:   Recent Labs  Lab 09/02/21 0258 09/02/21 0449  TROPONINIHS 16 15    BNP Invalid input(s): "POCBNP" D-Dimer Recent Labs    09/02/21 0258  DDIMER 0.55*   Hemoglobin A1C No results for input(s): "HGBA1C" in the last 72 hours. Fasting Lipid Panel No results for input(s): "CHOL", "HDL", "LDLCALC", "TRIG", "CHOLHDL", "LDLDIRECT" in the last 72 hours. Thyroid Function Tests No results for input(s): "TSH", "T4TOTAL", "T3FREE", "THYROIDAB" in the last 72 hours.  Invalid input(s): "FREET3" _____________  ECHOCARDIOGRAM COMPLETE  Result Date: 09/02/2021    ECHOCARDIOGRAM REPORT   Patient Name:   John Carrillo Date of Exam: 09/02/2021 Medical Rec #:   [id number]        Height:       69.0 in Accession #:    [id number]       Weight:       183.0 lb Date of Birth:  03/07/1950        BSA:          1.989 m Patient Age:    81 years         BP:           120/77 mmHg Patient Gender: M                HR:  77 bpm. Exam Location:  Inpatient Procedure: 2D Echo Indications:    chest pain  History:        Patient has prior history of Echocardiogram examinations, most                 recent 08/15/2017. CAD, Defibrillator and Pacemaker, chronic                 kidney disease, Arrythmias:LBBB; Risk Factors:Dyslipidemia,                 Hypertension and Diabetes.  Sonographer:    Johny Chess RDCS Referring Phys: [place]  1. There is thinning and dyskinesis of the left ventricular apex, consistent with postinfarction scar. No left ventricular thrombus is seen. Left ventricular ejection fraction, by estimation, is 35 to 40%. The left ventricle has moderately decreased function. The left ventricle demonstrates regional wall motion abnormalities (see scoring diagram/findings for description). Left ventricular diastolic parameters are consistent with Grade I diastolic dysfunction (impaired relaxation). There remains LBBB-related dyssynchrony despite CRT, making wall motion analysis less confident.  2. Right ventricular systolic function is normal. The right ventricular size is normal. There is normal pulmonary artery systolic pressure.  3. Left atrial size was mildly dilated.  4. The mitral valve is normal in structure. No evidence of mitral valve regurgitation.  5. The aortic valve is tricuspid. Aortic valve regurgitation is not visualized. No aortic stenosis is present.  6. The inferior vena cava is normal in size with greater than 50% respiratory variability, suggesting right atrial pressure of 3 mmHg. Comparison(s): No significant change from prior study. Prior images reviewed side by side. The left ventricular function is unchanged. The left  ventricular wall motion abnormalities are unchanged. The images from 2019 were not avaialable for review. Compared to the 2018 study. FINDINGS  Left Ventricle: There is thinning and dyskinesis of the left ventricular apex, consistent with postinfarction scar. No left ventricular thrombus is seen. Left ventricular ejection fraction, by estimation, is 35 to 40%. The left ventricle has moderately decreased function. The left ventricle demonstrates regional wall motion abnormalities. The left ventricular internal cavity size was normal in size. There is no left ventricular hypertrophy. Abnormal (paradoxical) septal motion, consistent with left bundle branch block. Left ventricular diastolic parameters are consistent with Grade I diastolic dysfunction (impaired relaxation). Normal left ventricular filling pressure.  LV Wall Scoring: The apical septal segment, apical anterior segment, apical inferior segment, and apex are dyskinetic. The mid anteroseptal segment and apical lateral segment are hypokinetic. The anterior wall, antero-lateral wall, inferior wall, posterior wall, basal anteroseptal segment, mid inferoseptal segment, and basal inferoseptal segment are normal. There remains LBBB-related dyssynchrony despite CRT, making wall motion analysis less confident. Right Ventricle: The right ventricular size is normal. No increase in right ventricular wall thickness. Right ventricular systolic function is normal. There is normal pulmonary artery systolic pressure. The tricuspid regurgitant velocity is 2.14 m/s, and  with an assumed right atrial pressure of 3 mmHg, the estimated right ventricular systolic pressure is 96.7 mmHg. Left Atrium: Left atrial size was mildly dilated. Right Atrium: Right atrial size was normal in size. Pericardium: There is no evidence of pericardial effusion. Mitral Valve: The mitral valve is normal in structure. No evidence of mitral valve regurgitation. Tricuspid Valve: The tricuspid valve is  normal in structure. Tricuspid valve regurgitation is trivial. Aortic Valve: The aortic valve is tricuspid. Aortic valve regurgitation is not visualized. No aortic stenosis is present. Pulmonic Valve: The pulmonic valve  was grossly normal. Pulmonic valve regurgitation is trivial. Aorta: The aortic root and ascending aorta are structurally normal, with no evidence of dilitation. Venous: The inferior vena cava is normal in size with greater than 50% respiratory variability, suggesting right atrial pressure of 3 mmHg. IAS/Shunts: No atrial level shunt detected by color flow Doppler. Additional Comments: A device lead is visualized in the right ventricle.  LEFT VENTRICLE PLAX 2D LVIDd:         5.00 cm      Diastology LVIDs:         3.70 cm      LV e' medial:    6.64 cm/s LV PW:         1.10 cm      LV E/e' medial:  8.2 LV IVS:        1.10 cm      LV e' lateral:   5.77 cm/s LVOT diam:     2.10 cm      LV E/e' lateral: 9.4 LV SV:         72 LV SV Index:   36 LVOT Area:     3.46 cm  LV Volumes (MOD) LV vol d, MOD A4C: 113.0 ml LV vol s, MOD A4C: 79.9 ml LV SV MOD A4C:     113.0 ml RIGHT VENTRICLE             IVC RV Basal diam:  2.50 cm     IVC diam: 1.50 cm RV S prime:     12.30 cm/s LEFT ATRIUM             Index        RIGHT ATRIUM           Index LA diam:        4.00 cm 2.01 cm/m   RA Area:     12.90 cm LA Vol (A2C):   61.9 ml 31.11 ml/m  RA Volume:   30.00 ml  15.08 ml/m LA Vol (A4C):   45.7 ml 22.97 ml/m LA Biplane Vol: 57.4 ml 28.85 ml/m  AORTIC VALVE LVOT Vmax:   113.00 cm/s LVOT Vmean:  72.100 cm/s LVOT VTI:    0.207 m  AORTA Ao Root diam: 3.60 cm Ao Asc diam:  3.70 cm MITRAL VALVE               TRICUSPID VALVE MV Area (PHT): 4.49 cm    TR Peak grad:   18.3 mmHg MV Decel Time: 169 msec    TR Vmax:        214.00 cm/s MV E velocity: 54.30 cm/s MV A velocity: 93.50 cm/s  SHUNTS MV E/A ratio:  0.58        Systemic VTI:  0.21 m                            Systemic Diam: 2.10 cm Dani Gobble Croitoru MD Electronically  signed by Sanda Klein MD Signature Date/Time: 09/02/2021/5:18:17 PM    Final    DG Chest 2 View  Result Date: 09/02/2021 CLINICAL DATA:  Left-sided chest pain and shortness of breath EXAM: CHEST - 2 VIEW COMPARISON:  01/10/2016 FINDINGS: Cardiac shadow is within normal limits. Defibrillator is again noted and stable. The lungs are well aerated bilaterally with mild scarring in the bases. No sizable effusion or focal infiltrate is noted. No bony abnormality is seen. IMPRESSION: Mild scarring in the bases bilaterally. No other focal abnormality  is noted. Electronically Signed   By: Inez Catalina M.D.   On: 09/02/2021 03:27    Disposition   Pt is being discharged home today in good condition.  Follow-up Plans & Appointments     Follow-up Information     Imogene Burn, PA-C Follow up on 09/11/2021.   Specialty: Cardiology Why: August 8th at 1:45PM. Cardiology follow up with Dr. Kyla Balzarine physician assistant Contact information: [address] [place] [id number] [phone]                  Discharge Medications   Allergies as of 09/03/2021   No Known Allergies      Medication List     STOP taking these medications    acetaminophen 325 MG tablet Commonly known as: TYLENOL       TAKE these medications    carvedilol 6.25 MG tablet Commonly known as: COREG TAKE 1 TABLET(6.25 MG) BY MOUTH TWICE DAILY What changed: See the new instructions.   CINNAMON PO Take 1 tablet by mouth daily.   clopidogrel 75 MG tablet Commonly known as: PLAVIX TAKE 1 TABLET(75 MG) BY MOUTH DAILY What changed: See the new instructions.   colchicine 0.6 MG tablet Take 1 tablet (0.6 mg total) by mouth daily. Start taking on: September 04, 2021   CoQ10 200 MG Caps Take 200 mg by mouth in the morning.   dapagliflozin propanediol 5 MG Tabs tablet Commonly known as: Farxiga Take 1 tablet (5 mg total) by mouth daily.   Dexcom G7 Sensor Misc 1 Device by Does not apply  route as directed. Change sensor every 10 days   Entresto 49-51 MG Generic drug: sacubitril-valsartan TAKE 1 TABLET BY MOUTH EVERY MORNING AND EVERY NIGHT AT BEDTIME. PLEASE KEEP UPCOMING APPT IN MARCH 2023 FOR MORE REFILLS. What changed: See the new instructions.   ezetimibe 10 MG tablet Commonly known as: ZETIA TAKE 1 TABLET(10 MG) BY MOUTH DAILY   GARLIC PO Take 1 tablet by mouth daily.   glucose blood test strip Commonly known as: OneTouch Verio 1 each by Other route 2 (two) times daily. And lancets 2/day   insulin lispro 100 UNIT/ML KwikPen Commonly known as: HumaLOG KwikPen 3 times a day (just before each meal) 7-3-7 units, and pen needles 4/day. What changed: when to take this   levothyroxine 112 MCG tablet Commonly known as: SYNTHROID Take 1 tablet (112 mcg total) by mouth daily before breakfast.   OneTouch Verio w/Device Kit 1 Device by Does not apply route daily.   predniSONE 20 MG tablet Commonly known as: DELTASONE Take 2 tablets (40 mg total) by mouth daily with breakfast. Start taking on: September 04, 2021   PROBIOTIC PO Take 1 capsule by mouth every morning.   rosuvastatin 10 MG tablet Commonly known as: CRESTOR Take 1 tablet (10 mg total) by mouth daily.   tadalafil 5 MG tablet Commonly known as: CIALIS TAKE 1 TABLET(5 MG) BY MOUTH DAILY What changed: See the new instructions.   Toujeo SoloStar 300 UNIT/ML Solostar Pen Generic drug: insulin glargine (1 Unit Dial) Inject 38 Units into the skin daily. 38 units once daily           Outstanding Labs/Studies   N/A  Duration of Discharge Encounter   Greater than 30 minutes including physician time.  Hilbert Corrigan, PA 09/03/2021, 10:58 AM

## 2021-09-04 LAB — LIPOPROTEIN A (LPA): Lipoprotein (a): 107.6 nmol/L — ABNORMAL HIGH (ref <75.0)

## 2021-09-04 NOTE — Telephone Encounter (Signed)
**Note De-Identified Berthold Glace Obfuscation** Patient contacted regarding discharge from [facility] on 09/03/1021.  Patient understands to follow up with provider Jacolyn Reedy, PA-c on 09/11/2021 at 10:45 at [address] in [place], [place] [address].  Patient understands discharge instructions? Yes Patient understands medications and regiment? Yes Patient understands to bring all medications to this visit? Yes  Ask patient:  Are you enrolled in My Chart: Yes  The pt reports that he is doing well today and he denies having any CP/discomfort, SOB, nausea, vomiting, diaphoresis, dizziness, or lightheadedness. He verified that he does have [facility]'s phone number and states that he will call us if he has any questions or concerns.  He thanked me for my call.

## 2021-09-07 NOTE — Progress Notes (Signed)
Cardiology Office Note    Date:  09/11/2021   ID:  John Carrillo, DOB 1950-02-22, MRN [id number]   PCP:  Marin Olp, [facility]  Cardiologist:  Jenkins Rouge, MD   Advanced Practice Provider:  No care team member to display Electrophysiologist:  Thompson Grayer, MD   [phone]   Chief Complaint  Patient presents with   Hospitalization Follow-up    History of Present Illness:  John Carrillo is a 71 y.o. male with CAD status post anterior MI 2003 s/p TPA and rescue LAD PCI at [facility], ICM, LBBB, CVA 2016, HL, DM 2.  He declined ICD 2008.  Had SEMI 2016 with RCA intervention EF 15%, Readmitted 01/24/15 with right occipital lobe CVA placed on Brillinta and Eliquis. 2017 agreed to MDT CRTD device implant. He is being seen 09/02/2021 for the evaluation of Chest pain.  Patient admitted with chest pain 08/2021.  Serial troponin were negative x2.  He was treated with a short course of prednisone 40 mg and colchicine. Repeat echocardiogram obtained on 09/02/2021 showed EF 35 to to 40%, thinning and dyskinesis of the LV apex consistent with postinfarction scar, grade 1 DD, LBBB related desynchrony.  No significant change when compared to the previous study.  Patient was seen in the morning of 09/03/2021 at which time his chest pain has completely resolved.  He no longer has left shoulder pain, he does have right shoulder pain instead. Walking at a moderate pace ok but hold off on upper body weight lifting and heavy exertion.  Patient comes in for f/u. Finished steroids. All the sharp chest pain is gone but having lingering soreness in his chest. Wants to know when he can start working out in the gym again.        Past Medical History:  Diagnosis Date   Acute kidney injury superimposed on chronic kidney disease ([place]) 01/25/2015   AICD (automatic cardioverter/defibrillator) present    CHF (congestive heart failure) ([place])    CORONARY ARTERY DISEASE 08/11/2006    CVA 12/19/2006   2008   DIABETES MELLITUS, TYPE I 08/11/2006   ECZEMA, HANDS 12/15/2009   History of kidney stones    10+ years ago   HYPERLIPIDEMIA 08/11/2006   HYPERTENSION 08/11/2006   Hypothyroidism    Myocardial infarction [facility])    Presence of permanent cardiac pacemaker    Type 2 diabetes mellitus without complications ([place]) 19/62/2297    Past Surgical History:  Procedure Laterality Date   CARDIAC CATHETERIZATION N/A 01/16/2015   Procedure: Left Heart Cath and Coronary Angiography;  Surgeon: Lorretta Harp, MD;  Location: [place] [facility];  Service: Cardiovascular;  Laterality: N/A;   CORONARY STENT PLACEMENT     EP IMPLANTABLE DEVICE N/A 01/10/2016   MDT [place] MRI Quad CRTD implanted by Dr Rayann Heman   INGUINAL HERNIA REPAIR Left 05/22/2020   Procedure: LEFT INGUINAL HERNIA REPAIR WITH MESH;  Surgeon: Coralie Keens, MD;  Location: [place];  Service: General;  Laterality: Left;   INSERT / REPLACE / REMOVE PACEMAKER     INSERTION OF MESH Left 05/22/2020   Procedure: INSERTION OF MESH;  Surgeon: Coralie Keens, MD;  Location: [place];  Service: General;  Laterality: Left;   TEE WITHOUT CARDIOVERSION N/A 01/26/2015   Procedure: TRANSESOPHAGEAL ECHOCARDIOGRAM (TEE);  Surgeon: Sanda Klein, MD;  Location: [place];  Service: Cardiovascular;  Laterality: N/A;   TONSILECTOMY, ADENOIDECTOMY, BILATERAL MYRINGOTOMY AND TUBES     TONSILLECTOMY  WISDOM TOOTH EXTRACTION     WRIST FRACTURE SURGERY Right 1960   3rd grade    Current Medications: Current Meds  Medication Sig   Blood Glucose Monitoring Suppl (ONETOUCH VERIO) w/Device KIT 1 Device by Does not apply route daily.   carvedilol (COREG) 6.25 MG tablet TAKE 1 TABLET(6.25 MG) BY MOUTH TWICE DAILY   CINNAMON PO Take 1 tablet by mouth daily.   clopidogrel (PLAVIX) 75 MG tablet TAKE 1 TABLET(75 MG) BY MOUTH DAILY   Coenzyme Q10 (COQ10) 200 MG CAPS Take 200 mg by mouth in the morning.   colchicine 0.6 MG tablet TAKE 1  TABLET(0.6 MG) BY MOUTH DAILY   Continuous Blood Gluc Sensor (DEXCOM G7 SENSOR) MISC 1 Device by Does not apply route as directed. Change sensor every 10 days   dapagliflozin propanediol (FARXIGA) 5 MG TABS tablet Take 1 tablet (5 mg total) by mouth daily.   ENTRESTO 49-51 MG TAKE 1 TABLET BY MOUTH EVERY MORNING AND EVERY NIGHT AT BEDTIME. PLEASE KEEP UPCOMING APPT.   ezetimibe (ZETIA) 10 MG tablet TAKE 1 TABLET(10 MG) BY MOUTH DAILY   GARLIC PO Take 1 tablet by mouth daily.   glucose blood (ONETOUCH VERIO) test strip 1 each by Other route 2 (two) times daily. And lancets 2/day   insulin glargine, 1 Unit Dial, (TOUJEO SOLOSTAR) 300 UNIT/ML Solostar Pen Inject 38 Units into the skin daily. 38 units once daily   insulin lispro (HUMALOG KWIKPEN) 100 UNIT/ML KwikPen 3 times a day (just before each meal) 7-3-7 units, and pen needles 4/day. (Patient taking differently: See admin instructions. 3 times a day (just before each meal) 7-3-7 units, and pen needles 4/day.)   levothyroxine (SYNTHROID) 112 MCG tablet Take 1 tablet (112 mcg total) by mouth daily before breakfast.   Probiotic Product (PROBIOTIC PO) Take 1 capsule by mouth every morning.   rosuvastatin (CRESTOR) 10 MG tablet Take 1 tablet (10 mg total) by mouth daily.   tadalafil (CIALIS) 5 MG tablet TAKE 1 TABLET(5 MG) BY MOUTH DAILY (Patient taking differently: Take 5 mg by mouth daily as needed for erectile dysfunction.)     Allergies:   Patient has no known allergies.   Social History   Socioeconomic History   Marital status: Married    Spouse name: Not on file   Number of children: Not on file   Years of education: Not on file   Highest education level: Not on file  Occupational History   Occupation: Software engineer: [employer]  Tobacco Use   Smoking status: Former    Packs/day: 1.00    Years: 30.00    Total pack years: 30.00    Types: Cigarettes    Quit date: 05/03/2000    Years since quitting: 21.3    Smokeless tobacco: Never  Vaping Use   Vaping Use: Never used  Substance and Sexual Activity   Alcohol use: Yes    Alcohol/week: 3.0 standard drinks of alcohol    Types: 1 Glasses of wine, 1 Cans of beer, 1 Shots of liquor per week    Comment: 1-2/month   Drug use: No   Sexual activity: Not on file  Other Topics Concern   Not on file  Social History Narrative   Married (kids are patients here, wife seen elsewhere). 3 step children, 1 biological. No grandkids.    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[employer] ([employer]) but also wants to do  grad school   Oldest daughter Ledell Noss - college of Nature conservation officer- social work intl business minor religion- now trying to get foot in door at [employer]- wants to be in Merchandiser, retail for general dynamics- consider age 25 retirement      Hobbies: race sail boats, adrenaline related activities   Social Determinants of Radio broadcast assistant Strain: Not on Art therapist Insecurity: Not on file  Transportation Needs: Not on file  Physical Activity: Not on file  Stress: Not on file  Social Connections: Not on file     Family History:  The patient's  family history includes Brain cancer in his maternal grandmother; CAD in his father, paternal grandfather, and paternal grandmother; Celiac disease in his sister; Dementia in his mother; Lymphoma in his sister; Other in his mother.   ROS:   Please see the history of present illness.    ROS All other systems reviewed and are negative.   PHYSICAL EXAM:   VS:  BP 106/68   Pulse 63   Ht 5' 9"  (1.753 m)   Wt 178 lb 9.6 oz (81 kg)   SpO2 95%   BMI 26.37 kg/m   Physical Exam  GEN: Well nourished, well developed, in no acute distress  Neck: no JVD, carotid bruits, or masses Cardiac:RRR; no murmurs, rubs, or gallops  Respiratory:  clear to auscultation bilaterally, normal work of breathing GI: soft, nontender, nondistended, + BS Ext: without cyanosis,  clubbing, or edema, Good distal pulses bilaterally Neuro:  Alert and Oriented x 3,  Psych: euthymic mood, full affect  Wt Readings from Last 3 Encounters:  09/11/21 178 lb 9.6 oz (81 kg)  09/02/21 183 lb (83 kg)  07/17/21 175 lb 9.6 oz (79.7 kg)      Studies/Labs Reviewed:   EKG:  EKG is not ordered today.    Recent Labs: 07/13/2021: ALT 42; TSH 7.62 09/02/2021: B Natriuretic Peptide 142.3; BUN 19; Creatinine, Ser 1.20; Hemoglobin 15.6; Platelets 162; Potassium 3.6; Sodium 141   Lipid Panel    Component Value Date/Time   CHOL 124 04/03/2021 0750   TRIG 109 04/03/2021 0750   HDL 53 04/03/2021 0750   CHOLHDL 2.3 04/03/2021 0750   CHOLHDL 2 01/14/2020 0756   VLDL 13.0 01/14/2020 0756   LDLCALC 51 04/03/2021 0750   LDLDIRECT 85.0 07/11/2017 0839    Additional studies/ records that were reviewed today include:  Echo 09/02/21 IMPRESSIONS     1. There is thinning and dyskinesis of the left ventricular apex,  consistent with postinfarction scar. No left ventricular thrombus is seen.  Left ventricular ejection fraction, by estimation, is 35 to 40%. The left  ventricle has moderately decreased  function. The left ventricle demonstrates regional wall motion  abnormalities (see scoring diagram/findings for description). Left  ventricular diastolic parameters are consistent with Grade I diastolic  dysfunction (impaired relaxation). There remains  LBBB-related dyssynchrony despite CRT, making wall motion analysis less  confident.   2. Right ventricular systolic function is normal. The right ventricular  size is normal. There is normal pulmonary artery systolic pressure.   3. Left atrial size was mildly dilated.   4. The mitral valve is normal in structure. No evidence of mitral valve  regurgitation.   5. The aortic valve is tricuspid. Aortic valve regurgitation is not  visualized. No aortic stenosis is present.   6. The inferior vena cava is normal in size with greater than 50%   respiratory variability, suggesting  right atrial pressure of 3 mmHg.   Comparison(s): No significant change from prior study. Prior images  reviewed side by side. The left ventricular function is unchanged. The  left ventricular wall motion abnormalities are unchanged. The images from  2019 were not avaialable for review.  Compared to the 2018 study.   FINDINGS   Left Ventricle: There is thinning and dyskinesis of the left ventricular  apex, consistent with postinfarction scar. No left ventricular thrombus is  seen. Left ventricular ejection fraction, by estimation, is 35 to 40%. The  left ventricle has moderately  decreased function. The left ventricle demonstrates regional wall motion  abnormalities. The left ventricular internal cavity size was normal in  size. There is no left ventricular hypertrophy. Abnormal (paradoxical)  septal motion, consistent with left  bundle branch block. Left ventricular diastolic parameters are consistent  with Grade I diastolic dysfunction (impaired relaxation). Normal left  ventricular filling pressure.    Risk Assessment/Calculations:         ASSESSMENT:    1. Viral pericarditis, unspecified chronicity   2. Coronary artery disease involving native coronary artery of native heart without angina pectoris   3. Cardiomyopathy, ischemic   4. History of CVA (cerebrovascular accident)   5. Essential hypertension   6. Hyperlipidemia, unspecified hyperlipidemia type      PLAN:  In order of problems listed above:  Pericarditis equivocal diagnosis with recent adm with chest pain treated with steroids and colchicine. Sharp pain is gone for the first time since Jan. Still has some lingering soreness. Will continue colchicine for 3 months. No upper body heavy lifting for now until he is seen back and symptoms resolve  CAD status post anterior MI 2003 s/p TPA and rescue LAD PCI at [facility], Had STEMI 2016 with RCA intervention EF 15%, improved 35-40%-no  angina   ICM LVEEF 35-40% on echo 08/2021, no CHF symptoms  01/24/15 with right occipital lobe CVA placed on Brillinta and Eliquis  MDT CRTD  HTN BP controlled  HLD LDL 51 03/2021   Shared Decision Making/Informed Consent        Medication Adjustments/Labs and Tests Ordered: Current medicines are reviewed at length with the patient today.  Concerns regarding medicines are outlined above.  Medication changes, Labs and Tests ordered today are listed in the Patient Instructions below. Patient Instructions  Medication Instructions:  Your physician recommends that you continue on your current medications as directed. Please refer to the Current Medication list given to you today.  *If you need a refill on your cardiac medications before your next appointment, please call your pharmacy*   Lab Work: None  If you have labs (blood work) drawn today and your tests are completely normal, you will receive your results only by: [place] (if you have MyChart) OR A paper copy in the mail If you have any lab test that is abnormal or we need to change your treatment, we will call you to review the results.   Follow-Up: At [facility], you and your health needs are our priority.  As part of our continuing mission to provide you with exceptional heart care, we have created designated Provider Care Teams.  These Care Teams include your primary Cardiologist (physician) and Advanced Practice Providers (APPs -  Physician Assistants and Nurse Practitioners) who all work together to provide you with the care you need, when you need it.  Your next appointment:   12/11/2021 with Ermalinda Barrios, PA 6 month(s)  The format for your next  appointment:   In Person  Provider:   Jenkins Rouge, MD       Signed, Ermalinda Barrios, PA-C  09/11/2021 2:07 PM    [facility] [place], [place], [place]  [address] Phone: [phone]; Fax: [phone]

## 2021-09-11 ENCOUNTER — Encounter: Payer: Self-pay | Admitting: Physician Assistant

## 2021-09-11 ENCOUNTER — Ambulatory Visit (INDEPENDENT_AMBULATORY_CARE_PROVIDER_SITE_OTHER): Payer: Medicare Other | Admitting: Physician Assistant

## 2021-09-11 VITALS — BP 106/68 | HR 63 | Ht 69.0 in | Wt 178.6 lb

## 2021-09-11 DIAGNOSIS — I255 Ischemic cardiomyopathy: Secondary | ICD-10-CM | POA: Diagnosis not present

## 2021-09-11 DIAGNOSIS — E785 Hyperlipidemia, unspecified: Secondary | ICD-10-CM

## 2021-09-11 DIAGNOSIS — Z8673 Personal history of transient ischemic attack (TIA), and cerebral infarction without residual deficits: Secondary | ICD-10-CM | POA: Diagnosis not present

## 2021-09-11 DIAGNOSIS — I2510 Atherosclerotic heart disease of native coronary artery without angina pectoris: Secondary | ICD-10-CM | POA: Diagnosis not present

## 2021-09-11 DIAGNOSIS — B3323 Viral pericarditis: Secondary | ICD-10-CM

## 2021-09-11 DIAGNOSIS — I10 Essential (primary) hypertension: Secondary | ICD-10-CM

## 2021-09-11 NOTE — Patient Instructions (Signed)
Medication Instructions:  Your physician recommends that you continue on your current medications as directed. Please refer to the Current Medication list given to you today.  *If you need a refill on your cardiac medications before your next appointment, please call your pharmacy*   Lab Work: None  If you have labs (blood work) drawn today and your tests are completely normal, you will receive your results only by: MyChart Message (if you have MyChart) OR A paper copy in the mail If you have any lab test that is abnormal or we need to change your treatment, we will call you to review the results.   Follow-Up: At [facility], you and your health needs are our priority.  As part of our continuing mission to provide you with exceptional heart care, we have created designated Provider Care Teams.  These Care Teams include your primary Cardiologist (physician) and Advanced Practice Providers (APPs -  Physician Assistants and Nurse Practitioners) who all work together to provide you with the care you need, when you need it.  Your next appointment:   12/11/2021 with Jacolyn Reedy, PA 6 month(s)  The format for your next appointment:   In Person  Provider:   Charlton Haws, MD

## 2021-09-28 ENCOUNTER — Encounter: Payer: Self-pay | Admitting: Family Medicine

## 2021-10-01 ENCOUNTER — Other Ambulatory Visit (INDEPENDENT_AMBULATORY_CARE_PROVIDER_SITE_OTHER): Payer: Medicare Other

## 2021-10-01 DIAGNOSIS — Z794 Long term (current) use of insulin: Secondary | ICD-10-CM

## 2021-10-01 DIAGNOSIS — E039 Hypothyroidism, unspecified: Secondary | ICD-10-CM

## 2021-10-01 DIAGNOSIS — E1165 Type 2 diabetes mellitus with hyperglycemia: Secondary | ICD-10-CM

## 2021-10-01 LAB — BASIC METABOLIC PANEL
BUN: 14 mg/dL (ref 6–23)
CO2: 29 mEq/L (ref 19–32)
Calcium: 8.7 mg/dL (ref 8.4–10.5)
Chloride: 107 mEq/L (ref 96–112)
Creatinine, Ser: 1.3 mg/dL (ref 0.40–1.50)
GFR: 55.83 mL/min — ABNORMAL LOW (ref >60.00)
Glucose, Bld: 68 mg/dL — ABNORMAL LOW (ref 70–99)
Potassium: 4.2 mEq/L (ref 3.5–5.1)
Sodium: 141 mEq/L (ref 135–145)

## 2021-10-01 LAB — HEMOGLOBIN A1C: Hgb A1c MFr Bld: 7 % — ABNORMAL HIGH (ref 4.6–6.5)

## 2021-10-01 LAB — T4, FREE: Free T4: 1.18 ng/dL (ref 0.60–1.60)

## 2021-10-01 LAB — TSH: TSH: 2.91 u[IU]/mL (ref 0.35–5.50)

## 2021-10-02 ENCOUNTER — Other Ambulatory Visit: Payer: Medicare Other

## 2021-10-09 ENCOUNTER — Encounter: Payer: Self-pay | Admitting: Endocrinology

## 2021-10-09 ENCOUNTER — Ambulatory Visit: Payer: Medicare Other | Admitting: Endocrinology

## 2021-10-09 VITALS — BP 110/70 | HR 69 | Ht 69.0 in | Wt 178.8 lb

## 2021-10-09 DIAGNOSIS — E039 Hypothyroidism, unspecified: Secondary | ICD-10-CM | POA: Diagnosis not present

## 2021-10-09 DIAGNOSIS — Z794 Long term (current) use of insulin: Secondary | ICD-10-CM | POA: Diagnosis not present

## 2021-10-09 DIAGNOSIS — E1165 Type 2 diabetes mellitus with hyperglycemia: Secondary | ICD-10-CM

## 2021-10-09 NOTE — Progress Notes (Signed)
Patient ID: KWADWO TARAS, male   DOB: August 16, 1950, 71 y.o.   MRN: [id number]             Reason for Appointment: Type II Diabetes follow-up   History of Present Illness   Diagnosis date: 2008  Previous history:  Oral hypoglycemic drugs previously used are: [place] from 8/21 Insulin was started soon after his diagnosis and he has been mostly on glargine insulin with Humalog Not clear whether he was treated with metformin previously  A1c range in the last few years is:7.4-9.4  Recent history:     Non-insulin hypoglycemic drugs: Farxiga 5 mg daily     Insulin regimen: 32 once a day in a.m. Humalog before meals: 4-6 at breakfast--4 lunch--4-8 after dinner         Side effects from medications: None  Current self management, blood sugar patterns and problems identified:  A1c is 7 compared to 7.9,  He was switched from Lantus to Toujeo on the last visit He also was told to start taking Humalog before starting to eat He also has been started on the Dexcom CGM and was reassured that this was not going to interfere with his cardiac devices  With using the Dexcom CGM he has been able to monitor his blood sugars much more closely and will adjust his insulin  He is taking Humalog based on his meal size is also Premeal blood sugar and usually takes about the same amount for breakfast and lunch  Usually in the evening he will adjust the dose based on what he is eating and may take up to 10 units rarely However he thinks his blood sugars are getting higher when he is eating desserts like ice cream or eating pizza or cookies  Overall he has had to reduce his basal insulin and has cut it down to 32 units from starting dose of 36 units TOUJEO  As before he is fairly active and still going to the gym in the mornings frequently  Usually able to avoid any low sugars with exercise in the gym with a snack However occasionally may be getting relatively low late afternoon when he is very active  does not Does not want to consider insulin pump   Exercise: Gym 3-4/7 days  Overall blood sugars are within the target range at all times HIGHEST blood sugars on an average are late in the evenings after 10 PM However significant variability is present in the evenings between 12 and 6 PM-12 AM Blood sugars are the lowest early morning around 4-6 AM POSTPRANDIAL blood sugar spikes are seen sporadically at different meals either lunch or dinner time  However overall postprandial rise in blood sugar on an average is not excessive and blood sugars are very evenly controlled after lunch Also may have rebound high sugars only rarely following a low blood sugar HYPOGLYCEMIC episodes are seen mostly early morning but occasionally has little morning or low sugars in the evening  Average blood sugar ranges from low 112 at 4 AM up to a high of 163 around midnight  CGM use % of time 99  2-week average/GV 132/35  Time in range       82%  % Time Above 180 13  % Time above 250 1  % Time Below 70 3       Dietician visit: Most recent: At the time of diagnosis  Weight control:  Wt Readings from Last 3 Encounters:  10/09/21 178 lb 12.8 oz (81.1 kg)  09/11/21 178 lb 9.6 oz (81 kg)  09/02/21 183 lb (83 kg)            Diabetes labs:  Lab Results  Component Value Date   HGBA1C 7.0 (H) 10/01/2021   HGBA1C 7.9 (H) 07/13/2021   HGBA1C 7.9 (A) 04/16/2021   Lab Results  Component Value Date   MICROALBUR 1.6 07/13/2021   LDLCALC 51 04/03/2021   CREATININE 1.30 10/01/2021     Allergies as of 10/09/2021   No Known Allergies      Medication List        Accurate as of October 09, 2021 10:11 AM. If you have any questions, ask your nurse or doctor.          carvedilol 6.25 MG tablet Commonly known as: COREG TAKE 1 TABLET(6.25 MG) BY MOUTH TWICE DAILY   CINNAMON PO Take 1 tablet by mouth daily.   clopidogrel 75 MG tablet Commonly known as: PLAVIX TAKE 1 TABLET(75 MG) BY MOUTH  DAILY   colchicine 0.6 MG tablet TAKE 1 TABLET(0.6 MG) BY MOUTH DAILY   CoQ10 200 MG Caps Take 200 mg by mouth in the morning.   dapagliflozin propanediol 5 MG Tabs tablet Commonly known as: Farxiga Take 1 tablet (5 mg total) by mouth daily.   Dexcom G7 Sensor Misc 1 Device by Does not apply route as directed. Change sensor every 10 days   Entresto 49-51 MG Generic drug: sacubitril-valsartan TAKE 1 TABLET BY MOUTH EVERY MORNING AND EVERY NIGHT AT BEDTIME. PLEASE KEEP UPCOMING APPT.   ezetimibe 10 MG tablet Commonly known as: ZETIA TAKE 1 TABLET(10 MG) BY MOUTH DAILY   GARLIC PO Take 1 tablet by mouth daily.   glucose blood test strip Commonly known as: OneTouch Verio 1 each by Other route 2 (two) times daily. And lancets 2/day   insulin lispro 100 UNIT/ML KwikPen Commonly known as: HumaLOG KwikPen 3 times a day (just before each meal) 7-3-7 units, and pen needles 4/day. What changed: when to take this   levothyroxine 112 MCG tablet Commonly known as: SYNTHROID Take 1 tablet (112 mcg total) by mouth daily before breakfast.   OneTouch Verio w/Device Kit 1 Device by Does not apply route daily.   PROBIOTIC PO Take 1 capsule by mouth every morning.   rosuvastatin 10 MG tablet Commonly known as: CRESTOR Take 1 tablet (10 mg total) by mouth daily.   tadalafil 5 MG tablet Commonly known as: CIALIS TAKE 1 TABLET(5 MG) BY MOUTH DAILY What changed: See the new instructions.   Toujeo SoloStar 300 UNIT/ML Solostar Pen Generic drug: insulin glargine (1 Unit Dial) Inject 38 Units into the skin daily. 38 units once daily        Allergies: No Known Allergies  Past Medical History:  Diagnosis Date   Acute kidney injury superimposed on chronic kidney disease ([place]) 01/25/2015   AICD (automatic cardioverter/defibrillator) present    CHF (congestive heart failure) ([place])    CORONARY ARTERY DISEASE 08/11/2006   CVA 12/19/2006   2008   DIABETES MELLITUS, TYPE I 08/11/2006    ECZEMA, HANDS 12/15/2009   History of kidney stones    10+ years ago   HYPERLIPIDEMIA 08/11/2006   HYPERTENSION 08/11/2006   Hypothyroidism    Myocardial infarction [facility])    Presence of permanent cardiac pacemaker    Type 2 diabetes mellitus without complications ([place]) 27/07/2374    Past Surgical History:  Procedure Laterality Date   CARDIAC CATHETERIZATION N/A 01/16/2015   Procedure: Left Heart  Cath and Coronary Angiography;  Surgeon: Lorretta Harp, MD;  Location: [place] CV LAB;  Service: Cardiovascular;  Laterality: N/A;   CORONARY STENT PLACEMENT     EP IMPLANTABLE DEVICE N/A 01/10/2016   MDT Claria MRI Quad CRTD implanted by Dr Rayann Heman   INGUINAL HERNIA REPAIR Left 05/22/2020   Procedure: LEFT INGUINAL HERNIA REPAIR WITH MESH;  Surgeon: Coralie Keens, MD;  Location: [place];  Service: General;  Laterality: Left;   INSERT / REPLACE / REMOVE PACEMAKER     INSERTION OF MESH Left 05/22/2020   Procedure: INSERTION OF MESH;  Surgeon: Coralie Keens, MD;  Location: [place];  Service: General;  Laterality: Left;   TEE WITHOUT CARDIOVERSION N/A 01/26/2015   Procedure: TRANSESOPHAGEAL ECHOCARDIOGRAM (TEE);  Surgeon: Sanda Klein, MD;  Location: [facility] ENDOSCOPY;  Service: Cardiovascular;  Laterality: N/A;   TONSILECTOMY, ADENOIDECTOMY, BILATERAL MYRINGOTOMY AND TUBES     TONSILLECTOMY     WISDOM TOOTH EXTRACTION     WRIST FRACTURE SURGERY Right 1960   3rd grade    Family History  Problem Relation Age of Onset   CAD Father    Other Mother        passed right before 28- tumor on kidney recent diagnosis   Dementia Mother        progressing rapidly   CAD Paternal Grandfather        8s   CAD Paternal Grandmother        34s   Brain cancer Maternal Grandmother    Lymphoma Sister        b cell   Celiac disease Sister    Diabetes Neg Hx     Social History:  reports that he quit smoking about 21 years ago. His smoking use included cigarettes. He has a 30.00 pack-year smoking  history. He has never used smokeless tobacco. He reports current alcohol use of about 3.0 standard drinks of alcohol per week. He reports that he does not use drugs.  Review of Systems:  Last diabetic eye exam date 9/22 showing mild nonproliferative background retinopathy  Last foot exam date: 8/22  Symptoms of neuropathy: Usually none Has history of erectile dysfunction  Hypertension:   Treatment includes Entresto and Coreg  BP Readings from Last 3 Encounters:  10/09/21 110/70  09/11/21 106/68  09/03/21 108/74   Lab Results  Component Value Date   CREATININE 1.30 10/01/2021   CREATININE 1.20 09/02/2021   CREATININE 1.29 (H) 09/02/2021    Lipids: Managed with Crestor 10 mg prescribed by his cardiologist, also on Zetia    Lab Results  Component Value Date   CHOL 124 04/03/2021   CHOL 124 12/18/2020   CHOL 123 06/16/2020   Lab Results  Component Value Date   HDL 53 04/03/2021   HDL 51 12/18/2020   HDL 51 06/16/2020   Lab Results  Component Value Date   LDLCALC 51 04/03/2021   LDLCALC 58 12/18/2020   LDLCALC 56 06/16/2020   Lab Results  Component Value Date   TRIG 109 04/03/2021   TRIG 71 12/18/2020   TRIG 81 06/16/2020   Lab Results  Component Value Date   CHOLHDL 2.3 04/03/2021   CHOLHDL 2.4 12/18/2020   CHOLHDL 2.4 06/16/2020   Lab Results  Component Value Date   LDLDIRECT 85.0 07/11/2017   LDLDIRECT 90.0 02/17/2015   LDLDIRECT 100.0 12/23/2014   He has been on thyroid supplement since about 2008 He thinks his initial symptoms were weight gain and fatigue  Has  not had any recent monitoring of his thyroid level and does not complain of any fatigue The dose was increased on his visit in June Currently taking levothyroxine 112 mcg which he takes before breakfast  Lab Results  Component Value Date   TSH 2.91 10/01/2021   TSH 7.62 (H) 07/13/2021   TSH 4.42 03/14/2020   FREET4 1.18 10/01/2021       Examination:   BP 110/70   Pulse 69   Ht  5' 9"  (1.753 m)   Wt 178 lb 12.8 oz (81.1 kg)   SpO2 94%   BMI 26.40 kg/m   Body mass index is 26.4 kg/m.    ASSESSMENT/ PLAN:    Diabetes type 2 insulin-dependent:   Current regimen: Basal bolus insulin, Farxiga 5 mg daily  A1c is  7 at his best level  Blood glucose control is much better and now better assessed with his new Dexcom CGM as discussed above  See history of present illness for detailed discussion of current diabetes management, blood sugar patterns and problems identified   Recommendations: Reduce Toujeo down to 28 instead of 32 If his blood sugars are trending above 130 he can go up on the dose to 30 and adjust every 3 to 5 days Continue adjusting mealtime doses based on his total carbohydrate intake and meal size as well as more for eating now May take supplemental doses for desserts like ice cream Discussed appropriate treatment of low sugars May skip the lunch mealtime dose if very active during the day Continue Farxiga unchanged   HYPOTHYROIDISM: TSH is back to normal with 112 mcg levothyroxine which she will continue   Patient Instructions  Toujeo 28 units and keep am sugars 80-120   Micheal Murad 10/09/2021, 10:11 AM

## 2021-10-09 NOTE — Patient Instructions (Signed)
Toujeo 28 units and keep am sugars 80-120

## 2021-10-10 ENCOUNTER — Ambulatory Visit (INDEPENDENT_AMBULATORY_CARE_PROVIDER_SITE_OTHER): Payer: Medicare Other

## 2021-10-10 DIAGNOSIS — I255 Ischemic cardiomyopathy: Secondary | ICD-10-CM

## 2021-10-10 LAB — CUP PACEART REMOTE DEVICE CHECK
Battery Remaining Longevity: 30 mo
Battery Voltage: 2.95 V
Brady Statistic AP VP Percent: 25.79 %
Brady Statistic AP VS Percent: 0.44 %
Brady Statistic AS VP Percent: 72.18 %
Brady Statistic AS VS Percent: 1.59 %
Brady Statistic RA Percent Paced: 25.9 %
Brady Statistic RV Percent Paced: 24.82 %
Date Time Interrogation Session: 20230906002205
HighPow Impedance: 74 Ohm
Implantable Lead Implant Date: 20171206
Implantable Lead Implant Date: 20171206
Implantable Lead Implant Date: 20171206
Implantable Lead Location: 753858
Implantable Lead Location: 753859
Implantable Lead Location: 753860
Implantable Lead Model: 4598
Implantable Lead Model: 5076
Implantable Pulse Generator Implant Date: 20171206
Lead Channel Impedance Value: 203.256
Lead Channel Impedance Value: 203.256
Lead Channel Impedance Value: 203.256
Lead Channel Impedance Value: 218.5 Ohm
Lead Channel Impedance Value: 218.5 Ohm
Lead Channel Impedance Value: 323 Ohm
Lead Channel Impedance Value: 380 Ohm
Lead Channel Impedance Value: 399 Ohm
Lead Channel Impedance Value: 399 Ohm
Lead Channel Impedance Value: 437 Ohm
Lead Channel Impedance Value: 437 Ohm
Lead Channel Impedance Value: 437 Ohm
Lead Channel Impedance Value: 456 Ohm
Lead Channel Impedance Value: 627 Ohm
Lead Channel Impedance Value: 646 Ohm
Lead Channel Impedance Value: 703 Ohm
Lead Channel Impedance Value: 703 Ohm
Lead Channel Impedance Value: 760 Ohm
Lead Channel Pacing Threshold Amplitude: 0.5 V
Lead Channel Pacing Threshold Amplitude: 1 V
Lead Channel Pacing Threshold Amplitude: 1.25 V
Lead Channel Pacing Threshold Pulse Width: 0.4 ms
Lead Channel Pacing Threshold Pulse Width: 0.4 ms
Lead Channel Pacing Threshold Pulse Width: 0.4 ms
Lead Channel Sensing Intrinsic Amplitude: 15.125 mV
Lead Channel Sensing Intrinsic Amplitude: 15.125 mV
Lead Channel Sensing Intrinsic Amplitude: 4.875 mV
Lead Channel Sensing Intrinsic Amplitude: 4.875 mV
Lead Channel Setting Pacing Amplitude: 1.75 V
Lead Channel Setting Pacing Amplitude: 2 V
Lead Channel Setting Pacing Amplitude: 2.5 V
Lead Channel Setting Pacing Pulse Width: 0.4 ms
Lead Channel Setting Pacing Pulse Width: 0.4 ms
Lead Channel Setting Sensing Sensitivity: 0.3 mV

## 2021-10-12 ENCOUNTER — Encounter: Payer: Self-pay | Admitting: Endocrinology

## 2021-10-17 ENCOUNTER — Other Ambulatory Visit: Payer: Medicare Other

## 2021-10-25 ENCOUNTER — Other Ambulatory Visit: Payer: Medicare Other

## 2021-10-26 ENCOUNTER — Ambulatory Visit: Payer: Medicare Other | Attending: Cardiovascular Disease

## 2021-10-26 DIAGNOSIS — E782 Mixed hyperlipidemia: Secondary | ICD-10-CM

## 2021-10-26 LAB — LIPID PANEL
Chol/HDL Ratio: 2.4 ratio (ref 0.0–5.0)
Cholesterol, Total: 134 mg/dL (ref 100–199)
HDL: 56 mg/dL (ref >39)
LDL Chol Calc (NIH): 63 mg/dL (ref 0–99)
Triglycerides: 75 mg/dL (ref 0–149)
VLDL Cholesterol Cal: 15 mg/dL (ref 5–40)

## 2021-10-26 LAB — HEPATIC FUNCTION PANEL
ALT: 30 IU/L (ref 0–44)
AST: 26 IU/L (ref 0–40)
Albumin: 4 g/dL (ref 3.9–4.9)
Alkaline Phosphatase: 54 IU/L (ref 44–121)
Bilirubin Total: 0.4 mg/dL (ref 0.0–1.2)
Bilirubin, Direct: 0.17 mg/dL (ref 0.00–0.40)
Total Protein: 6.2 g/dL (ref 6.0–8.5)

## 2021-10-29 ENCOUNTER — Telehealth: Payer: Self-pay

## 2021-10-29 ENCOUNTER — Encounter: Payer: Self-pay | Admitting: *Deleted

## 2021-10-29 DIAGNOSIS — E785 Hyperlipidemia, unspecified: Secondary | ICD-10-CM

## 2021-10-29 NOTE — Telephone Encounter (Signed)
-----   Message from Josue Hector, MD sent at 10/27/2021  9:12 AM EDT ----- Cholesterol is at goal and LFT's are normal F/U labs in 6 months

## 2021-10-29 NOTE — Telephone Encounter (Signed)
Will place order for lab work in 6 months.

## 2021-10-29 NOTE — Progress Notes (Signed)
Remote ICD transmission.   

## 2021-11-09 ENCOUNTER — Encounter: Payer: Self-pay | Admitting: Family Medicine

## 2021-12-01 ENCOUNTER — Other Ambulatory Visit: Payer: Self-pay | Admitting: Cardiovascular Disease

## 2021-12-03 ENCOUNTER — Encounter: Payer: Self-pay | Admitting: Cardiovascular Disease

## 2021-12-10 NOTE — Progress Notes (Unsigned)
Office Visit    Patient Name: John Carrillo Date of Encounter: 12/10/2021  PCP:  [name], MD   [facility]  Cardiologist:  Charlton Haws, MD  Advanced Practice Provider:  No care team member to display Electrophysiologist:  Hillis Range, MD   HPI    John Carrillo is a 71 y.o. male with past medical history significant for CAD status post anterior MI 2003 status post tPA and rescue LAD PCI [facility], ICM, LBBB, CVA 2016, HL, DM2 who presents today for follow-up appointment.  He declined ICD 2008.  Had STEMI 2016 with RCA intervention.  EF 15%, readmitted 01/2015 with right occipital lobe CVA placed on Brilinta and Eliquis.  2017 agreed to MDT CRT-D device implant.  He was last seen 09/11/2021.  Patient been admitted with chest pain 08/2021.  Serial troponin were negative x2.  Treated with short course of prednisone 40 mg and colchicine.  Repeat echocardiogram obtained 09/02/2021 with LVEF 35 to 40%, thinning and dyskinesis of the LV apex.  This was consistent with postinfarct scar, grade 1 DD, LBBB related to dyssynchrony.  No significant change when compared to previous study.  Patient was seen in the morning of 09/03/2021 at which time chest pain was completely resolved.  He no longer had left shoulder pain but did have right shoulder pain instead.  Walking at a moderate pace but told to hold off on heavy exertion and upper body weight lifting.  At his most recent follow-up he had finished his steroids.  All of his sharp chest pain was gone.  He was having some lingering soreness in his chest.  He was asking about when he could start working out of the gym again.  Colchicine was continued for 3 months.  Advised do upper body heavy lifting until follow-up.  Today, he ***  Past Medical History    Past Medical History:  Diagnosis Date   Acute kidney injury superimposed on chronic kidney disease (H[facility]) 01/25/2015   AICD (automatic  cardioverter/defibrillator) present    CHF (congestive heart failure) (H[facility])    CORONARY ARTERY DISEASE 08/11/2006   CVA 12/19/2006   2008   DIABETES MELLITUS, TYPE I 08/11/2006   ECZEMA, HANDS 12/15/2009   History of kidney stones    10+ years ago   HYPERLIPIDEMIA 08/11/2006   HYPERTENSION 08/11/2006   Hypothyroidism    Myocardial infarction [facility])    Presence of permanent cardiac pacemaker    Type 2 diabetes mellitus without complications (H[facility]) 12/20/2016   Past Surgical History:  Procedure Laterality Date   CARDIAC CATHETERIZATION N/A 01/16/2015   Procedure: Left Heart Cath and Coronary Angiography;  Surgeon: [name], MD;  Location: [facility] INVASIVE CV LAB;  Service: Cardiovascular;  Laterality: N/A;   CORONARY STENT PLACEMENT     EP IMPLANTABLE DEVICE N/A 01/10/2016   MDT Claria MRI Quad CRTD implanted by Dr [name]   INGUINAL HERNIA REPAIR Left 05/22/2020   Procedure: LEFT INGUINAL HERNIA REPAIR WITH MESH;  Surgeon: Abigail Miyamoto, MD;  Location: [facility] OR;  Service: General;  Laterality: Left;   INSERT / REPLACE / REMOVE PACEMAKER     INSERTION OF MESH Left 05/22/2020   Procedure: INSERTION OF MESH;  Surgeon: Abigail Miyamoto, MD;  Location: [facility] OR;  Service: General;  Laterality: Left;   TEE WITHOUT CARDIOVERSION N/A 01/26/2015   Procedure: TRANSESOPHAGEAL ECHOCARDIOGRAM (TEE);  Surgeon: [name], MD;  Location: [facility] ENDOSCOPY;  Service: Cardiovascular;  Laterality: N/A;  TONSILECTOMY, ADENOIDECTOMY, BILATERAL MYRINGOTOMY AND TUBES     TONSILLECTOMY     WISDOM TOOTH EXTRACTION     WRIST FRACTURE SURGERY Right 1960   3rd grade    Allergies  No Known Allergies  EKGs/Labs/Other Studies Reviewed:   The following studies were reviewed today:  Echocardiogram 09/02/2021 IMPRESSIONS     1. There is thinning and dyskinesis of the left ventricular apex,  consistent with postinfarction scar. No left ventricular thrombus is seen.  Left ventricular ejection fraction, by  estimation, is 35 to 40%. The left  ventricle has moderately decreased  function. The left ventricle demonstrates regional wall motion  abnormalities (see scoring diagram/findings for description). Left  ventricular diastolic parameters are consistent with Grade I diastolic  dysfunction (impaired relaxation). There remains  LBBB-related dyssynchrony despite CRT, making wall motion analysis less  confident.   2. Right ventricular systolic function is normal. The right ventricular  size is normal. There is normal pulmonary artery systolic pressure.   3. Left atrial size was mildly dilated.   4. The mitral valve is normal in structure. No evidence of mitral valve  regurgitation.   5. The aortic valve is tricuspid. Aortic valve regurgitation is not  visualized. No aortic stenosis is present.   6. The inferior vena cava is normal in size with greater than 50%  respiratory variability, suggesting right atrial pressure of 3 mmHg.   Comparison(s): No significant change from prior study. Prior images  reviewed side by side. The left ventricular function is unchanged. The  left ventricular wall motion abnormalities are unchanged. The images from  2019 were not avaialable for review.  Compared to the 2018 study.   FINDINGS   Left Ventricle: There is thinning and dyskinesis of the left ventricular  apex, consistent with postinfarction scar. No left ventricular thrombus is  seen. Left ventricular ejection fraction, by estimation, is 35 to 40%. The  left ventricle has moderately  decreased function. The left ventricle demonstrates regional wall motion  abnormalities. The left ventricular internal cavity size was normal in  size. There is no left ventricular hypertrophy. Abnormal (paradoxical)  septal motion, consistent with left  bundle branch block. Left ventricular diastolic parameters are consistent  with Grade I diastolic dysfunction (impaired relaxation). Normal left  ventricular filling  pressure.     LV Wall Scoring:  The apical septal segment, apical anterior segment, apical inferior  segment,  and apex are dyskinetic. The mid anteroseptal segment and apical lateral  segment are hypokinetic. The anterior wall, antero-lateral wall, inferior  wall, posterior wall, basal anteroseptal segment, mid inferoseptal  segment,  and basal inferoseptal segment are normal. There remains LBBB-related  dyssynchrony despite CRT, making wall motion analysis less confident.   Right Ventricle: The right ventricular size is normal. No increase in  right ventricular wall thickness. Right ventricular systolic function is  normal. There is normal pulmonary artery systolic pressure. The tricuspid  regurgitant velocity is 2.14 m/s, and   with an assumed right atrial pressure of 3 mmHg, the estimated right  ventricular systolic pressure is 88.5 mmHg.   Left Atrium: Left atrial size was mildly dilated.   Right Atrium: Right atrial size was normal in size.   Pericardium: There is no evidence of pericardial effusion.   Mitral Valve: The mitral valve is normal in structure. No evidence of  mitral valve regurgitation.   Tricuspid Valve: The tricuspid valve is normal in structure. Tricuspid  valve regurgitation is trivial.   Aortic Valve: The aortic valve  is tricuspid. Aortic valve regurgitation is  not visualized. No aortic stenosis is present.   Pulmonic Valve: The pulmonic valve was grossly normal. Pulmonic valve  regurgitation is trivial.   Aorta: The aortic root and ascending aorta are structurally normal, with  no evidence of dilitation.   Venous: The inferior vena cava is normal in size with greater than 50%  respiratory variability, suggesting right atrial pressure of 3 mmHg.   IAS/Shunts: No atrial level shunt detected by color flow Doppler.   EKG:  EKG is *** ordered today.  The ekg ordered today demonstrates ***  Recent Labs: 09/02/2021: B Natriuretic Peptide 142.3;  Hemoglobin 15.6; Platelets 162 10/01/2021: BUN 14; Creatinine, Ser 1.30; Potassium 4.2; Sodium 141; TSH 2.91 10/26/2021: ALT 30  Recent Lipid Panel    Component Value Date/Time   CHOL 134 10/26/2021 0903   TRIG 75 10/26/2021 0903   HDL 56 10/26/2021 0903   CHOLHDL 2.4 10/26/2021 0903   CHOLHDL 2 01/14/2020 0756   VLDL 13.0 01/14/2020 0756   LDLCALC 63 10/26/2021 0903   LDLDIRECT 85.0 07/11/2017 0839    Risk Assessment/Calculations:  {Does this patient have ATRIAL FIBRILLATION?:[phone]}  Home Medications   No outpatient medications have been marked as taking for the 12/12/21 encounter (Appointment) with Sharlene Dory, PA-C.     Review of Systems   ***   All other systems reviewed and are otherwise negative except as noted above.  Physical Exam    VS:  There were no vitals taken for this visit. , BMI There is no height or weight on file to calculate BMI.  Wt Readings from Last 3 Encounters:  10/09/21 178 lb 12.8 oz (81.1 kg)  09/11/21 178 lb 9.6 oz (81 kg)  09/02/21 183 lb (83 kg)     GEN: Well nourished, well developed, in no acute distress. HEENT: normal. Neck: Supple, no JVD, carotid bruits, or masses. Cardiac: ***RRR, no murmurs, rubs, or gallops. No clubbing, cyanosis, edema.  ***Radials/PT 2+ and equal bilaterally.  Respiratory:  ***Respirations regular and unlabored, clear to auscultation bilaterally. GI: Soft, nontender, nondistended. MS: No deformity or atrophy. Skin: Warm and dry, no rash. Neuro:  Strength and sensation are intact. Psych: Normal affect.  Assessment & Plan    Viral pericarditis CAD Ischemic cardiomyopathy History of CVA Hypertension Hyperlipidemia  No BP recorded.  {Refresh Note OR Click here to enter BP  :1}***      Disposition: Follow up {follow up:15908} with Charlton Haws, MD or APP.  Signed, Sharlene Dory, PA-C 12/10/2021, 12:34 PM [facility]

## 2021-12-11 ENCOUNTER — Ambulatory Visit: Payer: Medicare Other | Admitting: Physician Assistant

## 2021-12-11 NOTE — Progress Notes (Signed)
Office Visit    Patient Name: John Carrillo Date of Encounter: 12/11/2021  Primary Care Provider:  Marin Olp, MD Primary Cardiologist:  John Rouge, MD Primary Electrophysiologist: John Grayer, MD  Chief Complaint    John Carrillo is a 71 y.o. male with past medical history significant for CAD status post anterior MI 2003 status post tPA and rescue LAD PCI at [facility], ICM, LBBB, CVA right occipital lobe 2016, HL, DM2, V. tach, CRT-D (2017), who presents today for 58-monthfollow-up appointment.   Past Medical History    Past Medical History:  Diagnosis Date   Acute kidney injury superimposed on chronic kidney disease ([facility] 01/25/2015   AICD (automatic cardioverter/defibrillator) present    CHF (congestive heart failure) ([facility]    CORONARY ARTERY DISEASE 08/11/2006   CVA 12/19/2006   2008   DIABETES MELLITUS, TYPE I 08/11/2006   ECZEMA, HANDS 12/15/2009   History of kidney stones    10+ years ago   HYPERLIPIDEMIA 08/11/2006   HYPERTENSION 08/11/2006   Hypothyroidism    Myocardial infarction ([facility]    Presence of permanent cardiac pacemaker    Type 2 diabetes mellitus without complications ([facility] 171/07/2692  Past Surgical History:  Procedure Laterality Date   CARDIAC CATHETERIZATION N/A 01/16/2015   Procedure: Left Heart Cath and Coronary Angiography;  Surgeon: JLorretta Harp MD;  [facility];  Service: Cardiovascular;  Laterality: N/A;   CORONARY STENT PLACEMENT     EP IMPLANTABLE DEVICE N/A 01/10/2016   MDT Claria MRI Quad CRTD implanted by Dr ARayann Carrillo  INGUINAL HERNIA REPAIR Left 05/22/2020   Procedure: LEFT INGUINAL HERNIA REPAIR WITH MESH;  Surgeon: BCoralie Keens MD;  Location: [facility]  Service: General;  Laterality: Left;   INSERT / REPLACE / REMOVE PACEMAKER     INSERTION OF MESH Left 05/22/2020   Procedure: INSERTION OF MESH;  Surgeon: BCoralie Keens MD;  Location: [facility]  Service: General;  Laterality: Left;   TEE WITHOUT  CARDIOVERSION N/A 01/26/2015   Procedure: TRANSESOPHAGEAL ECHOCARDIOGRAM (TEE);  Surgeon: MSanda Klein MD;  [facility];  Service: Cardiovascular;  Laterality: N/A;   TONSILECTOMY, ADENOIDECTOMY, BILATERAL MYRINGOTOMY AND TUBES     TONSILLECTOMY     WISDOM TOOTH EXTRACTION     WRIST FRACTURE SURGERY Right 1960   3rd grade    Allergies  No Known Allergies  History of Present Illness    John Carrillo is a 71year old male with the above mention past medical history who presents today for 619-monthollow-up of coronary artery disease.  [name] been followed by Dr. NiJohnsie Carrillo many years.  He had a history of anterior wall MI in 2001 and [facility] He was evaluated by Dr. KlCaryl Comesn 2008 for ICD implant for primary prevention but declined at that time.  He was admitted 01/2015 with complaint of chest pain and shortness of breath following a heart at a party.  He was noted to have SVT with LBBB and converted to sinus rhythm after adenosine.  2D echo was completed showing EF of 15% he was sent for EP evaluation by Dr. AlRayann Hemannd ICD and LifeVest was not recommended with plan for optimize treatment after 3 months.  He was admitted on 01/24/2015 with sudden vision loss in left eye with CT showing new right occipital lobe CVA.  ILR was declined at that time and TEE demonstrated  severe LV dysfunction with decline tenuous echo contrast and LV indicating increased risk of thrombus.  He was started on Eliquis and Brilinta was continued.  2D echo was repeated 06/2015 with EF 20-25% and mild MR. He met with Dr. Rayann Carrillo 07/31/2015 and discussed compatible CRT device but patient declined at that time.  He however agreed and had BiV ICD placed 01/2016.  2D echo was completed and showed improved EF of 30-35%.  He was last seen by Dr. Johnsie Carrillo 04/2021 following a fall in January and in April injuring his shoulder.    He was seen 08/2021 with complaint of chest pain that  he endorsed at 5 out of 10 intensity.  He reports pain reminded him of his pre-PCI pain.  Serial troponins showed 16..15 and patient was started on steroid and colchicine for pleuritic chest pain.  Pain was described as sharp and worse with deep inspiration.  2D echo was repeated and showed EF 35 to 40% with thinning and dyskinesia of the LV apex consistent with postinfarct scar, grade 1 DD with LBB related dyssynchrony.  Pain was worse with lying down.  He was seen in follow-up 09/2021 by John Portland, PA reported no recurrent chest pain following completion of steroid.  He was continued on colchicine for 3 months continued on Brilinta and Eliquis for history of CVA.  John Carrillo presents today for follow-up alone.  Since last being seen in the office patient reports that he is doing well with no complaints of chest pain.  He completed his colchicine and tolerated this well.  He denies any excess swelling or chest pain.  He denies any chest pain moving forward or lying down.  He is currently euvolemic on examination and is tolerating his other medications without any adverse reactions.  His blood pressure today well controlled at 112/80 and heart rate was 60 bpm.  Patient denies chest pain, palpitations, dyspnea, PND, orthopnea, nausea, vomiting, dizziness, syncope, edema, weight gain, or early satiety.  Home Medications    Current Outpatient Medications  Medication Sig Dispense Refill   Blood Glucose Monitoring Suppl (ONETOUCH VERIO) w/Device KIT 1 Device by Does not apply route daily. 1 kit 0   carvedilol (COREG) 6.25 MG tablet TAKE 1 TABLET(6.25 MG) BY MOUTH TWICE DAILY 180 tablet 3   CINNAMON PO Take 1 tablet by mouth daily.     clopidogrel (PLAVIX) 75 MG tablet TAKE 1 TABLET(75 MG) BY MOUTH DAILY 90 tablet 2   Coenzyme Q10 (COQ10) 200 MG CAPS Take 200 mg by mouth in the morning.     colchicine 0.6 MG tablet TAKE 1 TABLET(0.6 MG) BY MOUTH DAILY 90 tablet 3   Continuous Blood Gluc Sensor (DEXCOM G7  SENSOR) MISC 1 Device by Does not apply route as directed. Change sensor every 10 days 3 each 3   dapagliflozin propanediol (FARXIGA) 5 MG TABS tablet Take 1 tablet (5 mg total) by mouth daily. 90 tablet 3   ENTRESTO 49-51 MG TAKE 1 TABLET BY MOUTH EVERY MORNING AND EVERY NIGHT AT BEDTIME 180 tablet 3   ezetimibe (ZETIA) 10 MG tablet TAKE 1 TABLET(10 MG) BY MOUTH DAILY 90 tablet 3   GARLIC PO Take 1 tablet by mouth daily.     glucose blood (ONETOUCH VERIO) test strip 1 each by Other route 2 (two) times daily. And lancets 2/day 200 each 12   insulin glargine, 1 Unit Dial, (TOUJEO SOLOSTAR) 300 UNIT/ML Solostar Pen Inject 38 Units into the skin daily. 38 units once daily  6 mL 3   insulin lispro (HUMALOG KWIKPEN) 100 UNIT/ML KwikPen 3 times a day (just before each meal) 7-3-7 units, and pen needles 4/day. (Patient taking differently: See admin instructions. 3 times a day (just before each meal) 7-3-7 units, and pen needles 4/day.) 30 mL 3   levothyroxine (SYNTHROID) 112 MCG tablet Take 1 tablet (112 mcg total) by mouth daily before breakfast. 90 tablet 1   Probiotic Product (PROBIOTIC PO) Take 1 capsule by mouth every morning.     rosuvastatin (CRESTOR) 10 MG tablet Take 1 tablet (10 mg total) by mouth daily. 90 tablet 3   tadalafil (CIALIS) 5 MG tablet TAKE 1 TABLET(5 MG) BY MOUTH DAILY (Patient taking differently: Take 5 mg by mouth daily as needed for erectile dysfunction.) 90 tablet 3   No current facility-administered medications for this visit.     Review of Systems  Please see the history of present illness.    (+) Seasonal allergies  All other systems reviewed and are otherwise negative except as noted above.  Physical Exam    Wt Readings from Last 3 Encounters:  10/09/21 178 lb 12.8 oz (81.1 kg)  09/11/21 178 lb 9.6 oz (81 kg)  09/02/21 183 lb (83 kg)   FY:BOFBP were no vitals filed for this visit.,There is no height or weight on file to calculate BMI.  Constitutional:       Appearance: Healthy appearance. Not in distress.  Neck:     Vascular: JVD normal.  Pulmonary:     Effort: Pulmonary effort is normal.     Breath sounds: No wheezing. No rales. Diminished in the bases Cardiovascular:     Normal rate. Regular rhythm. Normal S1. Normal S2.      Murmurs: There is no murmur.  Edema:    Peripheral edema absent.  Abdominal:     Palpations: Abdomen is soft non tender. There is no hepatomegaly.  Skin:    General: Skin is warm and dry.  Neurological:     General: No focal deficit present.     Mental Status: Alert and oriented to person, place and time.     Cranial Nerves: Cranial nerves are intact.  EKG/LABS/Other Studies Reviewed    ECG personally reviewed by me today -none completed today     Lab Results  Component Value Date   WBC 10.7 (H) 09/02/2021   HGB 15.6 09/02/2021   HCT 46.0 09/02/2021   MCV 100.0 09/02/2021   PLT 162 09/02/2021   Lab Results  Component Value Date   CREATININE 1.30 10/01/2021   BUN 14 10/01/2021   NA 141 10/01/2021   K 4.2 10/01/2021   CL 107 10/01/2021   CO2 29 10/01/2021   Lab Results  Component Value Date   ALT 30 10/26/2021   AST 26 10/26/2021   ALKPHOS 54 10/26/2021   BILITOT 0.4 10/26/2021   Lab Results  Component Value Date   CHOL 134 10/26/2021   HDL 56 10/26/2021   LDLCALC 63 10/26/2021   LDLDIRECT 85.0 07/11/2017   TRIG 75 10/26/2021   CHOLHDL 2.4 10/26/2021    Lab Results  Component Value Date   HGBA1C 7.0 (H) 10/01/2021    Assessment & Plan    1.  Coronary artery disease: -s/p anterior MI 2003 status post tPA and rescue LAD PCI  -Today he reports no chest pain or pleuritic discomfort -Continue GDMT with carvedilol 6.125 mg twice daily, Plavix 75 mg, Zetia and Crestor -He was advised to contact our office  if he experiences any new discomfort or return of the pleuritic pain experienced previously.  2.  Ischemic cardiomyopathy: -s/p CRT-D placed 2017  -2D echo was recently completed  showing EF 35 to 40% with thinning and dyskinesia of the LV apex consistent with postinfarct scar, grade 1 DD -Current GDMT consist of Entresto 49/51 mg twice daily, carvedilol 6.25 mg twice daily -He is euvolemic on exam today and blood pressures well controlled.  3.  History of CVA: - s/p CVA right occipital lobe 2016, currently on Plavix 75 mg -He denies any residual or complaints today.  4.  Hypertension: -Patient's blood pressure today was 112/80 -Continue carvedilol, Entresto as prescribed  5.  Hyperlipidemia: -Most recent LDL was 63 below goal of less than 70 -He reports some myalgias with Crestor and we discussed possible referral to lipid clinic for alternatives in the future if he experiences worse myalgias Continue Crestor 10 mg and ezetimibe 10 mg  Disposition: Follow-up with John Rouge, MD or APP in 6 months    Medication Adjustments/Labs and Tests Ordered: Current medicines are reviewed at length with the patient today.  Concerns regarding medicines are outlined above.   Signed, Mable Fill, Marissa Nestle, NP 12/11/2021, 12:03 PM [facility]  Note:  This document was prepared using Dragon voice recognition software and may include unintentional dictation errors.

## 2021-12-12 ENCOUNTER — Ambulatory Visit: Payer: Medicare Other | Admitting: Physician Assistant

## 2021-12-14 ENCOUNTER — Ambulatory Visit: Payer: Medicare Other | Attending: Physician Assistant | Admitting: Nurse Practitioner

## 2021-12-14 ENCOUNTER — Encounter: Payer: Self-pay | Admitting: Nurse Practitioner

## 2021-12-14 VITALS — BP 112/80 | HR 60 | Ht 69.0 in | Wt 186.0 lb

## 2021-12-14 DIAGNOSIS — Z8673 Personal history of transient ischemic attack (TIA), and cerebral infarction without residual deficits: Secondary | ICD-10-CM

## 2021-12-14 DIAGNOSIS — I2510 Atherosclerotic heart disease of native coronary artery without angina pectoris: Secondary | ICD-10-CM

## 2021-12-14 DIAGNOSIS — I255 Ischemic cardiomyopathy: Secondary | ICD-10-CM

## 2021-12-14 DIAGNOSIS — Z9861 Coronary angioplasty status: Secondary | ICD-10-CM

## 2021-12-14 DIAGNOSIS — E785 Hyperlipidemia, unspecified: Secondary | ICD-10-CM | POA: Diagnosis not present

## 2021-12-14 DIAGNOSIS — I10 Essential (primary) hypertension: Secondary | ICD-10-CM

## 2021-12-14 NOTE — Patient Instructions (Signed)
Medication Instructions:   Your physician recommends that you continue on your current medications as directed. Please refer to the Current Medication list given to you today.   *If you need a refill on your cardiac medications before your next appointment, please call your pharmacy*   Lab Work: NONE ORDERED  TODAY   If you have labs (blood work) drawn today and your tests are completely normal, you will receive your results only by: MyChart Message (if you have MyChart) OR A paper copy in the mail If you have any lab test that is abnormal or we need to change your treatment, we will call you to review the results.   Testing/Procedures: NONE ORDERED  TODAY     Follow-Up: At [facility], you and your health needs are our priority.  As part of our continuing mission to provide you with exceptional heart care, we have created designated Provider Care Teams.  These Care Teams include your primary Cardiologist (physician) and Advanced Practice Providers (APPs -  Physician Assistants and Nurse Practitioners) who all work together to provide you with the care you need, when you need it.  We recommend signing up for the patient portal called "MyChart".  Sign up information is provided on this After Visit Summary.  MyChart is used to connect with patients for Virtual Visits (Telemedicine).  Patients are able to view lab/test results, encounter notes, upcoming appointments, etc.  Non-urgent messages can be sent to your provider as well.   To learn more about what you can do with MyChart, go to [email or web address].    Your next appointment:   6 month(s)  The format for your next appointment:   In Person  Provider:   Jari Favre, PA-C        Other Instructions   Important Information About Sugar

## 2021-12-24 ENCOUNTER — Other Ambulatory Visit: Payer: Self-pay | Admitting: Internal Medicine

## 2021-12-25 ENCOUNTER — Other Ambulatory Visit: Payer: Self-pay | Admitting: Internal Medicine

## 2022-01-05 ENCOUNTER — Other Ambulatory Visit: Payer: Self-pay | Admitting: Endocrinology

## 2022-01-05 ENCOUNTER — Other Ambulatory Visit: Payer: Self-pay | Admitting: Student

## 2022-01-07 ENCOUNTER — Other Ambulatory Visit: Payer: Self-pay

## 2022-01-09 ENCOUNTER — Ambulatory Visit (INDEPENDENT_AMBULATORY_CARE_PROVIDER_SITE_OTHER): Payer: Medicare Other

## 2022-01-09 DIAGNOSIS — I255 Ischemic cardiomyopathy: Secondary | ICD-10-CM

## 2022-01-09 LAB — CUP PACEART REMOTE DEVICE CHECK
Battery Remaining Longevity: 32 mo
Battery Voltage: 2.95 V
Brady Statistic AP VP Percent: 28.01 %
Brady Statistic AP VS Percent: 0.48 %
Brady Statistic AS VP Percent: 70 %
Brady Statistic AS VS Percent: 1.5 %
Brady Statistic RA Percent Paced: 28.29 %
Brady Statistic RV Percent Paced: 30.19 %
Date Time Interrogation Session: 20231206031703
HighPow Impedance: 66 Ohm
Implantable Lead Connection Status: 753985
Implantable Lead Connection Status: 753985
Implantable Lead Connection Status: 753985
Implantable Lead Implant Date: 20171206
Implantable Lead Implant Date: 20171206
Implantable Lead Implant Date: 20171206
Implantable Lead Location: 753858
Implantable Lead Location: 753859
Implantable Lead Location: 753860
Implantable Lead Model: 4598
Implantable Lead Model: 5076
Implantable Pulse Generator Implant Date: 20171206
Lead Channel Impedance Value: 184.154
Lead Channel Impedance Value: 191.854
Lead Channel Impedance Value: 191.854
Lead Channel Impedance Value: 208.568
Lead Channel Impedance Value: 218.5 Ohm
Lead Channel Impedance Value: 304 Ohm
Lead Channel Impedance Value: 342 Ohm
Lead Channel Impedance Value: 380 Ohm
Lead Channel Impedance Value: 399 Ohm
Lead Channel Impedance Value: 399 Ohm
Lead Channel Impedance Value: 437 Ohm
Lead Channel Impedance Value: 437 Ohm
Lead Channel Impedance Value: 437 Ohm
Lead Channel Impedance Value: 665 Ohm
Lead Channel Impedance Value: 703 Ohm
Lead Channel Impedance Value: 703 Ohm
Lead Channel Impedance Value: 703 Ohm
Lead Channel Impedance Value: 703 Ohm
Lead Channel Pacing Threshold Amplitude: 0.5 V
Lead Channel Pacing Threshold Amplitude: 0.875 V
Lead Channel Pacing Threshold Amplitude: 1.25 V
Lead Channel Pacing Threshold Pulse Width: 0.4 ms
Lead Channel Pacing Threshold Pulse Width: 0.4 ms
Lead Channel Pacing Threshold Pulse Width: 0.4 ms
Lead Channel Sensing Intrinsic Amplitude: 16.875 mV
Lead Channel Sensing Intrinsic Amplitude: 16.875 mV
Lead Channel Sensing Intrinsic Amplitude: 3.125 mV
Lead Channel Sensing Intrinsic Amplitude: 3.125 mV
Lead Channel Setting Pacing Amplitude: 1.75 V
Lead Channel Setting Pacing Amplitude: 2 V
Lead Channel Setting Pacing Amplitude: 2.5 V
Lead Channel Setting Pacing Pulse Width: 0.4 ms
Lead Channel Setting Pacing Pulse Width: 0.4 ms
Lead Channel Setting Sensing Sensitivity: 0.3 mV
Zone Setting Status: 755011
Zone Setting Status: 755011

## 2022-01-22 ENCOUNTER — Other Ambulatory Visit: Payer: Self-pay | Admitting: Internal Medicine

## 2022-01-23 LAB — HM DIABETES EYE EXAM

## 2022-01-24 ENCOUNTER — Encounter: Payer: Self-pay | Admitting: Endocrinology

## 2022-02-01 NOTE — Progress Notes (Signed)
Remote ICD transmission.   

## 2022-02-11 ENCOUNTER — Other Ambulatory Visit (INDEPENDENT_AMBULATORY_CARE_PROVIDER_SITE_OTHER): Payer: Medicare Other

## 2022-02-11 DIAGNOSIS — E1165 Type 2 diabetes mellitus with hyperglycemia: Secondary | ICD-10-CM

## 2022-02-11 DIAGNOSIS — Z794 Long term (current) use of insulin: Secondary | ICD-10-CM | POA: Diagnosis not present

## 2022-02-11 LAB — HEMOGLOBIN A1C: Hgb A1c MFr Bld: 6.9 % — ABNORMAL HIGH (ref 4.6–6.5)

## 2022-02-11 LAB — BASIC METABOLIC PANEL
BUN: 17 mg/dL (ref 6–23)
CO2: 31 mEq/L (ref 19–32)
Calcium: 9.4 mg/dL (ref 8.4–10.5)
Chloride: 105 mEq/L (ref 96–112)
Creatinine, Ser: 1.27 mg/dL (ref 0.40–1.50)
GFR: 57.26 mL/min — ABNORMAL LOW (ref >60.00)
Glucose, Bld: 83 mg/dL (ref 70–99)
Potassium: 4.3 mEq/L (ref 3.5–5.1)
Sodium: 141 mEq/L (ref 135–145)

## 2022-02-13 ENCOUNTER — Encounter: Payer: Self-pay | Admitting: Endocrinology

## 2022-02-13 ENCOUNTER — Ambulatory Visit: Payer: Medicare Other | Admitting: Endocrinology

## 2022-02-13 VITALS — BP 138/80 | HR 60 | Ht 69.0 in | Wt 190.2 lb

## 2022-02-13 DIAGNOSIS — E039 Hypothyroidism, unspecified: Secondary | ICD-10-CM

## 2022-02-13 DIAGNOSIS — E1165 Type 2 diabetes mellitus with hyperglycemia: Secondary | ICD-10-CM

## 2022-02-13 DIAGNOSIS — Z794 Long term (current) use of insulin: Secondary | ICD-10-CM

## 2022-02-13 NOTE — Progress Notes (Signed)
Patient ID: John Carrillo, male   DOB: 05-21-1950, 71 y.o.   MRN: [id number]             Reason for Appointment: Type II Diabetes follow-up   History of Present Illness   Diagnosis date: 2008  Previous history:  Oral hypoglycemic drugs previously used are: [place] from 8/21 Insulin was started soon after his diagnosis and he has been mostly on glargine insulin with Humalog Not clear whether he was treated with metformin previously  A1c range in the last few years is:7.4-9.4  Recent history:     Non-insulin hypoglycemic drugs: Farxiga 5 mg daily     Insulin regimen: 36 Toujeo  once a day in a.m. Humalog before meals: 4-6 at breakfast--4 lunch--4-8 after dinner         Side effects from medications: None  Current self management, blood sugar patterns and problems identified:  A1c is 6.9 and stable  Blood sugar overall is well-controlled although recently time in range is not as good as before This is likely to be from getting large portions and snacks at dinnertime when blood sugars being higher regularly after dinner through late in the evening  Although he has not changed his Humalog dose he has gone up 4 units on the Toujeo likely from higher overnight readings, previously was recommended lower doses of Toujeo  However he is tending to be relatively lower hypoglycemic mid afternoon with being more active even with taking only 4 units of Humalog He only has a fruit snack in the afternoon and then gets very hungry in the evenings  Continues to be exercising but has gained weight Has not wanted to consider insulin pump   Exercise: Gym 3-4/7 days in ams  Interpretation of the Dexcom download shows the following interpretation  OVERNIGHT blood sugars show moderate variability and generally averaging 180 at midnight and gradually decreasing until about 6-7 AM; occasional hypoglycemia and week #1 appears mostly to be artifact otherwise may have occasional low normal readings   POSTPRANDIAL readings on an average fairly even after breakfast Usually postprandial readings after lunch are lower than Premeal readings including mild hypoglycemia at least on 2 or 3 occasions  HIGHEST blood sugars on an average are after dinner averaging about 199 around 8-9 PM in the evenings after 10 PM, occasionally blood sugars may continue to be high into the early part of the night As above hypoglycemia is mostly in the mid afternoon around 3-5 PM and rarely low normal overnight, although 4% of readings are below 70 this is not an accurate estimate  CGM use % of time 93  2-week average/GV 149/41  Time in range       71%  % Time Above 180 17  % Time above 250 7  % Time Below 70 4   Previously   CGM use % of time 99  2-week average/GV 132/35  Time in range       82%  % Time Above 180 13  % Time above 250 1  % Time Below 70 3     Dietician visit: Most recent: At the time of diagnosis  Weight control:  Wt Readings from Last 3 Encounters:  02/13/22 190 lb 3.2 oz (86.3 kg)  12/14/21 186 lb (84.4 kg)  10/09/21 178 lb 12.8 oz (81.1 kg)            Diabetes labs:  Lab Results  Component Value Date   HGBA1C 6.9 (H) 02/11/2022  HGBA1C 7.0 (H) 10/01/2021   HGBA1C 7.9 (H) 07/13/2021   Lab Results  Component Value Date   MICROALBUR 1.6 07/13/2021   LDLCALC 63 10/26/2021   CREATININE 1.27 02/11/2022     Allergies as of 02/13/2022   No Known Allergies      Medication List        Accurate as of February 13, 2022  8:48 AM. If you have any questions, ask your nurse or doctor.          carvedilol 6.25 MG tablet Commonly known as: COREG TAKE 1 TABLET(6.25 MG) BY MOUTH TWICE DAILY   CINNAMON PO Take 1 tablet by mouth daily.   clopidogrel 75 MG tablet Commonly known as: PLAVIX TAKE 1 TABLET(75 MG) BY MOUTH DAILY   CoQ10 200 MG Caps Take 200 mg by mouth in the morning.   dapagliflozin propanediol 5 MG Tabs tablet Commonly known as: Farxiga Take 1  tablet (5 mg total) by mouth daily.   Dexcom G7 Sensor Misc 1 Device by Does not apply route as directed. Change sensor every 10 days   Entresto 49-51 MG Generic drug: sacubitril-valsartan TAKE 1 TABLET BY MOUTH EVERY MORNING AND EVERY NIGHT AT BEDTIME   ezetimibe 10 MG tablet Commonly known as: ZETIA TAKE 1 TABLET(10 MG) BY MOUTH DAILY   GARLIC PO Take 1 tablet by mouth daily.   glucose blood test strip Commonly known as: OneTouch Verio 1 each by Other route 2 (two) times daily. And lancets 2/day   insulin lispro 100 UNIT/ML KwikPen Commonly known as: HumaLOG KwikPen 3 times a day (just before each meal) 7-3-7 units, and pen needles 4/day. What changed: when to take this   levothyroxine 112 MCG tablet Commonly known as: SYNTHROID TAKE 1 TABLET(112 MCG) BY MOUTH DAILY BEFORE BREAKFAST   OneTouch Verio w/Device Kit 1 Device by Does not apply route daily.   PROBIOTIC PO Take 1 capsule by mouth every morning.   rosuvastatin 10 MG tablet Commonly known as: CRESTOR Take 1 tablet (10 mg total) by mouth daily.   tadalafil 5 MG tablet Commonly known as: CIALIS TAKE 1 TABLET(5 MG) BY MOUTH DAILY What changed: See the new instructions.   Toujeo SoloStar 300 UNIT/ML Solostar Pen Generic drug: insulin glargine (1 Unit Dial) Inject 38 Units into the skin daily. 38 units once daily        Allergies: No Known Allergies  Past Medical History:  Diagnosis Date   Acute kidney injury superimposed on chronic kidney disease ([place]) 01/25/2015   AICD (automatic cardioverter/defibrillator) present    CHF (congestive heart failure) ([place])    CORONARY ARTERY DISEASE 08/11/2006   CVA 12/19/2006   2008   DIABETES MELLITUS, TYPE I 08/11/2006   ECZEMA, HANDS 12/15/2009   History of kidney stones    10+ years ago   HYPERLIPIDEMIA 08/11/2006   HYPERTENSION 08/11/2006   Hypothyroidism    Myocardial infarction [facility])    Presence of permanent cardiac pacemaker    Type 2 diabetes mellitus  without complications ([place]) 93/81/0175    Past Surgical History:  Procedure Laterality Date   CARDIAC CATHETERIZATION N/A 01/16/2015   Procedure: Left Heart Cath and Coronary Angiography;  Surgeon: Lorretta Harp, MD;  Location: [place] [facility] LAB;  Service: Cardiovascular;  Laterality: N/A;   CORONARY STENT PLACEMENT     EP IMPLANTABLE DEVICE N/A 01/10/2016   MDT [place] MRI Quad CRTD implanted by Dr Rayann Heman   INGUINAL HERNIA REPAIR Left 05/22/2020   Procedure: LEFT INGUINAL  HERNIA REPAIR WITH MESH;  Surgeon: Abigail Miyamoto, MD;  Location: [facility] OR;  Service: General;  Laterality: Left;   INSERT / REPLACE / REMOVE PACEMAKER     INSERTION OF MESH Left 05/22/2020   Procedure: INSERTION OF MESH;  Surgeon: Abigail Miyamoto, MD;  Location: [facility] OR;  Service: General;  Laterality: Left;   TEE WITHOUT CARDIOVERSION N/A 01/26/2015   Procedure: TRANSESOPHAGEAL ECHOCARDIOGRAM (TEE);  Surgeon: [name], MD;  Location: [facility] ENDOSCOPY;  Service: Cardiovascular;  Laterality: N/A;   TONSILECTOMY, ADENOIDECTOMY, BILATERAL MYRINGOTOMY AND TUBES     TONSILLECTOMY     WISDOM TOOTH EXTRACTION     WRIST FRACTURE SURGERY Right 1960   3rd grade    Family History  Problem Relation Age of Onset   CAD Father    Other Mother        passed right before 2- tumor on kidney recent diagnosis   Dementia Mother        progressing rapidly   CAD Paternal Grandfather        68s   CAD Paternal Grandmother        59s   Brain cancer Maternal Grandmother    Lymphoma Sister        b cell   Celiac disease Sister    Diabetes Neg Hx     Social History:  reports that he quit smoking about 21 years ago. His smoking use included cigarettes. He has a 30.00 pack-year smoking history. He has never used smokeless tobacco. He reports current alcohol use of about 3.0 standard drinks of alcohol per week. He reports that he does not use drugs.  Review of Systems:  Last diabetic eye exam date 9/22 showing mild  nonproliferative background retinopathy  Last foot exam date: 8/22  Symptoms of neuropathy: Usually none Has history of erectile dysfunction  Hypertension:   Treatment includes Entresto and Coreg He does monitor at home  BP Readings from Last 3 Encounters:  02/13/22 138/80  12/14/21 112/80  10/09/21 110/70   Lab Results  Component Value Date   CREATININE 1.27 02/11/2022   CREATININE 1.30 10/01/2021   CREATININE 1.20 09/02/2021    Lipids: Managed with Crestor 10 mg prescribed by his cardiologist, also on Zetia    Lab Results  Component Value Date   CHOL 134 10/26/2021   CHOL 124 04/03/2021   CHOL 124 12/18/2020   Lab Results  Component Value Date   HDL 56 10/26/2021   HDL 53 04/03/2021   HDL 51 12/18/2020   Lab Results  Component Value Date   LDLCALC 63 10/26/2021   LDLCALC 51 04/03/2021   LDLCALC 58 12/18/2020   Lab Results  Component Value Date   TRIG 75 10/26/2021   TRIG 109 04/03/2021   TRIG 71 12/18/2020   Lab Results  Component Value Date   CHOLHDL 2.4 10/26/2021   CHOLHDL 2.3 04/03/2021   CHOLHDL 2.4 12/18/2020   Lab Results  Component Value Date   LDLDIRECT 85.0 07/11/2017   LDLDIRECT 90.0 02/17/2015   LDLDIRECT 100.0 12/23/2014   He has been on thyroid supplement since about 2008 He thinks his initial symptoms were weight gain and fatigue  Has not had any recent monitoring of his thyroid level and does not complain of any fatigue The dose was increased on his visit in June 23 Currently taking levothyroxine 112 mcg every morning  Lab Results  Component Value Date   TSH 2.91 10/01/2021   TSH 7.62 (H) 07/13/2021   TSH 4.42  03/14/2020   FREET4 1.18 10/01/2021       Examination:   BP 138/80   Pulse 60   Ht 5\' 9"  (1.753 m)   Wt 190 lb 3.2 oz (86.3 kg)   SpO2 98%   BMI 28.09 kg/m   Body mass index is 28.09 kg/m.   Diabetic Foot Exam - Simple   Simple Foot Form Diabetic Foot exam was performed with the following findings: Yes    Visual Inspection No deformities, no ulcerations, no other skin breakdown bilaterally: Yes Sensation Testing Intact to touch and monofilament testing bilaterally: Yes Pulse Check Posterior Tibialis and Dorsalis pulse intact bilaterally: Yes See comments: Yes Comments Dorsalis pedis pulses not palpable     ASSESSMENT/ PLAN:    Diabetes type 2 insulin-dependent:   See history of present illness for detailed discussion of current diabetes management, blood sugar patterns and problems identified  Current regimen: Basal bolus insulin, Farxiga 5 mg daily  A1c is  6.9,  at his best level  Blood glucose control is generally fairly good but recently time in range is just 71% Also has frequent postprandial high blood sugars after dinner  Has also gained weight  Recommendations: Increase Farxiga to 10 mg, he can take 2 of the 5 mg together If his blood pressure tends to be significantly lower we will need to go back to 5 mg Otherwise he can call for a prescription for 10 mg and 5 mg once Also will monitor his renal function with the change If his blood sugars start getting lower overnight will reduce Toujeo down 2 to 4 units  Reminded him that the low blood sugar sometimes on the sensor may be artifacts when he is lying on the sensor and to verify the low blood sugar  He can generally skip the Humalog at lunchtime if he is planning to be active  Likely needs to take an average dose of 8 units to cover his dinner and 10 units for larger meals or more carbohydrate Instead of fruit in the afternoon he can have a more protein type snack in the mid afternoon to prevent being extra hungry at dinner  Consider adding a GLP-1 drug if weight gain continues to be a problem but he is reluctant to add another medication  Foot exam normal today  HYPOTHYROIDISM: TSH to be rechecked on the next visit  Follow-up in 3 months   Patient Instructions  Dinner dose 8 units average  Take 2 Farxiga  daily  Skip Humalog at lunch   02/13/2022, 8:48 AM

## 2022-02-13 NOTE — Patient Instructions (Addendum)
Dinner dose 8 units average  Take 2 Farxiga daily  Skip Humalog at lunch

## 2022-02-20 ENCOUNTER — Other Ambulatory Visit: Payer: Self-pay | Admitting: Endocrinology

## 2022-02-21 ENCOUNTER — Other Ambulatory Visit: Payer: Self-pay

## 2022-02-21 ENCOUNTER — Encounter: Payer: Self-pay | Admitting: Endocrinology

## 2022-02-21 DIAGNOSIS — E1165 Type 2 diabetes mellitus with hyperglycemia: Secondary | ICD-10-CM

## 2022-02-21 MED ORDER — INSULIN LISPRO (1 UNIT DIAL) 100 UNIT/ML (KWIKPEN): PEN_INJECTOR | SUBCUTANEOUS | 3 refills | Status: DC

## 2022-02-21 MED ORDER — PEN NEEDLES 32G X 4 MM MISC: 2 refills | Status: AC

## 2022-02-21 NOTE — Addendum Note (Signed)
Addended by: Cinda Quest on: 02/21/2022 03:57 PM   Modules accepted: Orders

## 2022-03-13 ENCOUNTER — Encounter: Payer: Self-pay | Admitting: Endocrinology

## 2022-03-13 DIAGNOSIS — E1165 Type 2 diabetes mellitus with hyperglycemia: Secondary | ICD-10-CM

## 2022-03-13 MED ORDER — DAPAGLIFLOZIN PROPANEDIOL 10 MG PO TABS: 10.0000 mg | ORAL_TABLET | Freq: Every day | ORAL | 1 refills | Status: DC

## 2022-03-16 ENCOUNTER — Other Ambulatory Visit: Payer: Self-pay | Admitting: Cardiovascular Disease

## 2022-04-02 ENCOUNTER — Encounter: Payer: Self-pay | Admitting: Endocrinology

## 2022-04-02 ENCOUNTER — Other Ambulatory Visit: Payer: Self-pay

## 2022-04-02 DIAGNOSIS — E1165 Type 2 diabetes mellitus with hyperglycemia: Secondary | ICD-10-CM

## 2022-04-02 MED ORDER — FARXIGA 10 MG PO TABS: 10.0000 mg | ORAL_TABLET | Freq: Every day | ORAL | 2 refills | Status: DC

## 2022-04-10 ENCOUNTER — Ambulatory Visit: Payer: Medicare Other

## 2022-04-10 DIAGNOSIS — I255 Ischemic cardiomyopathy: Secondary | ICD-10-CM

## 2022-04-10 LAB — CUP PACEART REMOTE DEVICE CHECK
Battery Remaining Longevity: 32 mo
Battery Voltage: 2.95 V
Brady Statistic AP VP Percent: 33.72 %
Brady Statistic AP VS Percent: 0.47 %
Brady Statistic AS VP Percent: 64.72 %
Brady Statistic AS VS Percent: 1.1 %
Brady Statistic RA Percent Paced: 33.94 %
Brady Statistic RV Percent Paced: 38.71 %
Date Time Interrogation Session: 20240306031807
HighPow Impedance: 66 Ohm
Implantable Lead Connection Status: 753985
Implantable Lead Connection Status: 753985
Implantable Lead Connection Status: 753985
Implantable Lead Implant Date: 20171206
Implantable Lead Implant Date: 20171206
Implantable Lead Implant Date: 20171206
Implantable Lead Location: 753858
Implantable Lead Location: 753859
Implantable Lead Location: 753860
Implantable Lead Model: 4598
Implantable Lead Model: 5076
Implantable Pulse Generator Implant Date: 20171206
Lead Channel Impedance Value: 199.5 Ohm
Lead Channel Impedance Value: 199.5 Ohm
Lead Channel Impedance Value: 208.568
Lead Channel Impedance Value: 208.568
Lead Channel Impedance Value: 208.568
Lead Channel Impedance Value: 304 Ohm
Lead Channel Impedance Value: 380 Ohm
Lead Channel Impedance Value: 399 Ohm
Lead Channel Impedance Value: 399 Ohm
Lead Channel Impedance Value: 399 Ohm
Lead Channel Impedance Value: 399 Ohm
Lead Channel Impedance Value: 437 Ohm
Lead Channel Impedance Value: 437 Ohm
Lead Channel Impedance Value: 665 Ohm
Lead Channel Impedance Value: 722 Ohm
Lead Channel Impedance Value: 722 Ohm
Lead Channel Impedance Value: 760 Ohm
Lead Channel Impedance Value: 760 Ohm
Lead Channel Pacing Threshold Amplitude: 0.5 V
Lead Channel Pacing Threshold Amplitude: 0.875 V
Lead Channel Pacing Threshold Amplitude: 1.25 V
Lead Channel Pacing Threshold Pulse Width: 0.4 ms
Lead Channel Pacing Threshold Pulse Width: 0.4 ms
Lead Channel Pacing Threshold Pulse Width: 0.4 ms
Lead Channel Sensing Intrinsic Amplitude: 13.5 mV
Lead Channel Sensing Intrinsic Amplitude: 13.5 mV
Lead Channel Sensing Intrinsic Amplitude: 2.5 mV
Lead Channel Sensing Intrinsic Amplitude: 2.5 mV
Lead Channel Setting Pacing Amplitude: 1.75 V
Lead Channel Setting Pacing Amplitude: 2 V
Lead Channel Setting Pacing Amplitude: 2.5 V
Lead Channel Setting Pacing Pulse Width: 0.4 ms
Lead Channel Setting Pacing Pulse Width: 0.4 ms
Lead Channel Setting Sensing Sensitivity: 0.3 mV
Zone Setting Status: 755011
Zone Setting Status: 755011

## 2022-04-26 ENCOUNTER — Encounter: Payer: Self-pay | Admitting: Family Medicine

## 2022-04-29 ENCOUNTER — Other Ambulatory Visit: Payer: Self-pay | Admitting: Family Medicine

## 2022-05-01 ENCOUNTER — Ambulatory Visit: Payer: Medicare Other | Attending: Cardiovascular Disease

## 2022-05-01 DIAGNOSIS — E785 Hyperlipidemia, unspecified: Secondary | ICD-10-CM

## 2022-05-03 ENCOUNTER — Telehealth: Payer: Self-pay

## 2022-05-03 LAB — LIPID PANEL
Chol/HDL Ratio: 2.6 ratio (ref 0.0–5.0)
Cholesterol, Total: 157 mg/dL (ref 100–199)
HDL: 61 mg/dL (ref >39)
LDL Chol Calc (NIH): 77 mg/dL (ref 0–99)
Triglycerides: 105 mg/dL (ref 0–149)
VLDL Cholesterol Cal: 19 mg/dL (ref 5–40)

## 2022-05-03 LAB — HEPATIC FUNCTION PANEL
ALT: 22 IU/L (ref 0–44)
AST: 21 IU/L (ref 0–40)
Albumin: 4.1 g/dL (ref 3.9–4.9)
Alkaline Phosphatase: 65 IU/L (ref 44–121)
Bilirubin Total: 0.5 mg/dL (ref 0.0–1.2)
Bilirubin, Direct: 0.15 mg/dL (ref 0.00–0.40)
Total Protein: 6.5 g/dL (ref 6.0–8.5)

## 2022-05-03 NOTE — Telephone Encounter (Signed)
Reviewed with patient that lipid labs are good per Dr. Johnsie Cancel and no changes are needed with his medications or treatment at this time. Patient verbalizes understanding.

## 2022-05-03 NOTE — Telephone Encounter (Signed)
-----   Message from Josue Hector, MD sent at 05/03/2022  8:33 AM EDT ----- Lipids good

## 2022-05-13 ENCOUNTER — Other Ambulatory Visit (INDEPENDENT_AMBULATORY_CARE_PROVIDER_SITE_OTHER): Payer: Medicare Other

## 2022-05-13 DIAGNOSIS — E039 Hypothyroidism, unspecified: Secondary | ICD-10-CM | POA: Diagnosis not present

## 2022-05-13 DIAGNOSIS — Z794 Long term (current) use of insulin: Secondary | ICD-10-CM

## 2022-05-13 DIAGNOSIS — E1165 Type 2 diabetes mellitus with hyperglycemia: Secondary | ICD-10-CM

## 2022-05-13 LAB — BASIC METABOLIC PANEL
BUN: 20 mg/dL (ref 6–23)
CO2: 26 mEq/L (ref 19–32)
Calcium: 9.1 mg/dL (ref 8.4–10.5)
Chloride: 105 mEq/L (ref 96–112)
Creatinine, Ser: 1.32 mg/dL (ref 0.40–1.50)
GFR: 54.58 mL/min — ABNORMAL LOW (ref >60.00)
Glucose, Bld: 153 mg/dL — ABNORMAL HIGH (ref 70–99)
Potassium: 4.2 mEq/L (ref 3.5–5.1)
Sodium: 140 mEq/L (ref 135–145)

## 2022-05-13 LAB — TSH: TSH: 7.6 u[IU]/mL — ABNORMAL HIGH (ref 0.35–5.50)

## 2022-05-13 LAB — HEMOGLOBIN A1C: Hgb A1c MFr Bld: 7 % — ABNORMAL HIGH (ref 4.6–6.5)

## 2022-05-14 NOTE — Progress Notes (Signed)
Remote ICD transmission.   

## 2022-05-15 ENCOUNTER — Ambulatory Visit: Payer: Medicare Other | Admitting: Endocrinology

## 2022-05-15 ENCOUNTER — Encounter: Payer: Self-pay | Admitting: Endocrinology

## 2022-05-15 VITALS — BP 118/74 | HR 80 | Ht 69.0 in | Wt 188.0 lb

## 2022-05-15 DIAGNOSIS — E039 Hypothyroidism, unspecified: Secondary | ICD-10-CM

## 2022-05-15 DIAGNOSIS — Z794 Long term (current) use of insulin: Secondary | ICD-10-CM

## 2022-05-15 DIAGNOSIS — E1165 Type 2 diabetes mellitus with hyperglycemia: Secondary | ICD-10-CM

## 2022-05-15 MED ORDER — GVOKE HYPOPEN 2-PACK 0.5 MG/0.1ML ~~LOC~~ SOAJ: SUBCUTANEOUS | 1 refills | Status: AC

## 2022-05-15 MED ORDER — LEVOTHYROXINE SODIUM 125 MCG PO TABS: 125.0000 ug | ORAL_TABLET | Freq: Every day | ORAL | 3 refills | Status: DC

## 2022-05-15 NOTE — Patient Instructions (Addendum)
Increase Humalog at breakfast by 2 units take only 3-4 units at lunch, 8-11 units at supper   Start OZEMPIC injections by dialing 0.25 mg on the pen as shown once weekly on the same day of the week.   You may inject in the sides of the stomach, outer thigh or arm as indicated in the brochure given. If you have any difficulties using the pen see the video at [email or web address]  You will feel fullness of the stomach with starting the medication and should try to keep the portions at meals small.  You may experience nausea in the first few days which usually gets better over time    After 4 weeks increase the dose to 0.5 mg weekly  If you have any questions or persistent side effects please call the office   You may also talk to a nurse educator with [employer] at [phone] Useful website: [email or web address]

## 2022-05-15 NOTE — Progress Notes (Signed)
Patient ID: John Carrillo, male   DOB: 25-Nov-1950, 72 y.o.   MRN: [id number]             Reason for Appointment: Type II Diabetes follow-up   History of Present Illness   Diagnosis date: 2008  Previous history:  Oral hypoglycemic drugs previously used are: [place] from 8/21 Insulin was started soon after his diagnosis and he has been mostly on glargine insulin with Humalog Not clear whether he was treated with metformin previously  A1c range in the last few years is:7.4-9.4  Recent history:     Non-insulin hypoglycemic drugs: Farxiga 10 mg daily     Insulin regimen: 28 Toujeo  once a day in a.m. Humalog before meals: 4-6 at breakfast--4 lunch--8 after dinner         Side effects from medications: None  Current self management, blood sugar patterns and problems identified:  A1c is 7 and stable  Blood sugar control appears to be significantly inconsistent recently as seen by his Dexcom sensor detailed below Also has much higher blood sugars in the second week of the last 2-week data especially after meals and late at night Not clear why his blood sugars are higher after breakfast and dinner lately, possibly from being on vacation He does still take his insulin before starting to eat Not currently adjusting his dose based on total carbohydrates or portions As before may be getting more carbohydrates and snacks later in the evening with highest readings after about 9 PM Overall taking somewhat less Toujeo than last year He was told on the last visit to make sure he has a protein snack in the afternoon but may still have low normal or slightly low sugars late afternoon He said that while on vacation he had significantly low sugars in the early afternoon even though he does not think he took coverage for a light lunch; he had difficulty being alert and needed a lot of juice and snacks to bring his blood sugar back up Overall average blood sugars are higher in the last 2 weeks  compared to his last visit Has not wanted to consider insulin pump previously but it is more open to this now   Exercise: Gym 3-4/7 days in ams  CGM use % of time   2-week average/GV 178/37  Time in range      54  %  % Time Above 180 29  % Time above 250 14  % Time Below 70 2    Interpretation of the Dexcom download shows the following interpretation  OVERNIGHT blood sugars are inconsistent but generally at the upper end of the target range on average.  Blood sugars showed significant hyperglycemia on at least 2 nights but also low normal another 2 occasions some improvement in blood sugars are generally highest around 10-11 PM through 1 AM Fasting blood sugars are relatively better in the first week and mostly higher in the second week POSTPRANDIAL readings are highly inconsistent: After breakfast blood sugars are mostly rising in the second week compared to the first week After lunch blood sugars are relatively flat to lower and usually low normal by 3-4 PM Postprandial readings after dinner are mostly higher in the second week compared to the first week but appear to be rising sometime later in the evening after 8-9 PM Hypoglycemia has been minimal with only transiently low sugars occasionally early part of the night or late afternoon  Previous data:  CGM use % of time 93  2-week average/GV 149/41  Time in range       71%  % Time Above 180 17  % Time above 250 7  % Time Below 70 4     Dietician visit: Most recent: At the time of diagnosis  Weight control:Max wt 205  Wt Readings from Last 3 Encounters:  05/15/22 188 lb (85.3 kg)  02/13/22 190 lb 3.2 oz (86.3 kg)  12/14/21 186 lb (84.4 kg)            Diabetes labs:  Lab Results  Component Value Date   HGBA1C 7.0 (H) 05/13/2022   HGBA1C 6.9 (H) 02/11/2022   HGBA1C 7.0 (H) 10/01/2021   Lab Results  Component Value Date   MICROALBUR 1.6 07/13/2021   LDLCALC 77 05/01/2022   CREATININE 1.32 05/13/2022      Allergies as of 05/15/2022   No Known Allergies      Medication List        Accurate as of May 15, 2022  9:03 AM. If you have any questions, ask your nurse or doctor.          carvedilol 6.25 MG tablet Commonly known as: COREG TAKE 1 TABLET(6.25 MG) BY MOUTH TWICE DAILY   CINNAMON PO Take 1 tablet by mouth daily.   clopidogrel 75 MG tablet Commonly known as: PLAVIX TAKE 1 TABLET(75 MG) BY MOUTH DAILY   CoQ10 200 MG Caps Take 200 mg by mouth in the morning.   Dexcom G7 Sensor Misc 1 Device by Does not apply route as directed. Change sensor every 10 days   Entresto 49-51 MG Generic drug: sacubitril-valsartan TAKE 1 TABLET BY MOUTH EVERY MORNING AND EVERY NIGHT AT BEDTIME   ezetimibe 10 MG tablet Commonly known as: ZETIA TAKE 1 TABLET(10 MG) BY MOUTH DAILY   Farxiga 10 MG Tabs tablet Generic drug: dapagliflozin propanediol Take 1 tablet (10 mg total) by mouth daily before breakfast.   GARLIC PO Take 1 tablet by mouth daily.   glucose blood test strip Commonly known as: OneTouch Verio 1 each by Other route 2 (two) times daily. And lancets 2/day   insulin lispro 100 UNIT/ML KwikPen Commonly known as: HumaLOG KwikPen 3 times a day (just before each meal) 7-3-7 units, and pen needles 4/day. What changed: additional instructions   levothyroxine 112 MCG tablet Commonly known as: SYNTHROID TAKE 1 TABLET(112 MCG) BY MOUTH DAILY BEFORE BREAKFAST   OneTouch Verio w/Device Kit 1 Device by Does not apply route daily.   Pen Needles 32G X 4 MM Misc Use to inject insulin 4 times a day   PROBIOTIC PO Take 1 capsule by mouth every morning.   rosuvastatin 10 MG tablet Commonly known as: CRESTOR TAKE 1 TABLET(10 MG) BY MOUTH DAILY   tadalafil 5 MG tablet Commonly known as: CIALIS TAKE 1 TABLET(5 MG) BY MOUTH DAILY   tamsulosin 0.4 MG Caps capsule Commonly known as: FLOMAX Take 0.4 mg by mouth daily.   Toujeo SoloStar 300 UNIT/ML Solostar  Pen Generic drug: insulin glargine (1 Unit Dial) ADMINISTER 38 UNITS UNDER THE SKIN EVERY DAY What changed: See the new instructions.        Allergies: No Known Allergies  Past Medical History:  Diagnosis Date   Acute kidney injury superimposed on chronic kidney disease 01/25/2015   AICD (automatic cardioverter/defibrillator) present    CHF (congestive heart failure)    CORONARY ARTERY DISEASE 08/11/2006   CVA 12/19/2006   2008   DIABETES MELLITUS, TYPE I 08/11/2006  ECZEMA, HANDS 12/15/2009   History of kidney stones    10+ years ago   HYPERLIPIDEMIA 08/11/2006   HYPERTENSION 08/11/2006   Hypothyroidism    Myocardial infarction    Presence of permanent cardiac pacemaker    Type 2 diabetes mellitus without complications 12/20/2016    Past Surgical History:  Procedure Laterality Date   CARDIAC CATHETERIZATION N/A 01/16/2015   Procedure: Left Heart Cath and Coronary Angiography;  Surgeon: [name], MD;  Location: [facility] INVASIVE CV LAB;  Service: Cardiovascular;  Laterality: N/A;   CORONARY STENT PLACEMENT     EP IMPLANTABLE DEVICE N/A 01/10/2016   MDT Claria MRI Quad CRTD implanted by Dr [name]   INGUINAL HERNIA REPAIR Left 05/22/2020   Procedure: LEFT INGUINAL HERNIA REPAIR WITH MESH;  Surgeon: Abigail Miyamoto, MD;  Location: [facility] OR;  Service: General;  Laterality: Left;   INSERT / REPLACE / REMOVE PACEMAKER     INSERTION OF MESH Left 05/22/2020   Procedure: INSERTION OF MESH;  Surgeon: Abigail Miyamoto, MD;  Location: [facility] OR;  Service: General;  Laterality: Left;   TEE WITHOUT CARDIOVERSION N/A 01/26/2015   Procedure: TRANSESOPHAGEAL ECHOCARDIOGRAM (TEE);  Surgeon: [name], MD;  Location: [facility] ENDOSCOPY;  Service: Cardiovascular;  Laterality: N/A;   TONSILECTOMY, ADENOIDECTOMY, BILATERAL MYRINGOTOMY AND TUBES     TONSILLECTOMY     WISDOM TOOTH EXTRACTION     WRIST FRACTURE SURGERY Right 1960   3rd grade    Family History  Problem Relation Age of Onset   CAD  Father    Other Mother        passed right before 44- tumor on kidney recent diagnosis   Dementia Mother        progressing rapidly   CAD Paternal Grandfather        37s   CAD Paternal Grandmother        30s   Brain cancer Maternal Grandmother    Lymphoma Sister        b cell   Celiac disease Sister    Diabetes Neg Hx     Social History:  reports that he quit smoking about 22 years ago. His smoking use included cigarettes. He has a 30.00 pack-year smoking history. He has never used smokeless tobacco. He reports current alcohol use of about 3.0 standard drinks of alcohol per week. He reports that he does not use drugs.  Review of Systems:  Last diabetic eye exam date 9/22 showing mild nonproliferative background retinopathy  Last foot exam date: 1/24  Symptoms of neuropathy: Usually none Has history of erectile dysfunction  Hypertension:   Treatment includes Entresto and Coreg He does monitor at home  BP Readings from Last 3 Encounters:  05/15/22 118/74  02/13/22 138/80  12/14/21 112/80   Lab Results  Component Value Date   CREATININE 1.32 05/13/2022   CREATININE 1.27 02/11/2022   CREATININE 1.30 10/01/2021    Lipids: Managed with Crestor 10 mg prescribed by his cardiologist, also on Zetia    Lab Results  Component Value Date   CHOL 157 05/01/2022   CHOL 134 10/26/2021   CHOL 124 04/03/2021   Lab Results  Component Value Date   HDL 61 05/01/2022   HDL 56 10/26/2021   HDL 53 04/03/2021   Lab Results  Component Value Date   LDLCALC 77 05/01/2022   LDLCALC 63 10/26/2021   LDLCALC 51 04/03/2021   Lab Results  Component Value Date   TRIG 105 05/01/2022   TRIG 75 10/26/2021  TRIG 109 04/03/2021   Lab Results  Component Value Date   CHOLHDL 2.6 05/01/2022   CHOLHDL 2.4 10/26/2021   CHOLHDL 2.3 04/03/2021   Lab Results  Component Value Date   LDLDIRECT 85.0 07/11/2017   LDLDIRECT 90.0 02/17/2015   LDLDIRECT 100.0 12/23/2014   He has been on  thyroid supplement since about 2008 He thinks his initial symptoms were weight gain and fatigue  The dose was increased on his visit in June 23 Currently taking levothyroxine 112 mcg every morning but TSH is higher now  Lab Results  Component Value Date   TSH 7.60 (H) 05/13/2022   TSH 2.91 10/01/2021   TSH 7.62 (H) 07/13/2021   FREET4 1.18 10/01/2021       Examination:   BP 118/74 (BP Location: Left Arm, Patient Position: Sitting, Cuff Size: Large)   Pulse 80   Ht 5\' 9"  (1.753 m)   Wt 188 lb (85.3 kg)   SpO2 96%   BMI 27.76 kg/m   Body mass index is 27.76 kg/m.   ASSESSMENT/ PLAN:    Diabetes type 2 insulin-dependent:   See history of present illness for detailed discussion of current diabetes management, blood sugar patterns and problems identified  Current regimen: Basal bolus insulin, Farxiga 5 mg daily  A1c is 7 but his recent GMI is 7.6   Recommendations: Referred to the diabetes educator to look into the insulin pump options, recommended Medtronic as the most efficient pump Again discussed reducing the amount of insulin to cover lunchtime but needs significantly more insulin at breakfast and lunch unless eating a small carbohydrate meal May benefit from adding a GLP-1 drug to help regulate his blood sugars better to avoid postprandial hyperglycemia on this weight loss tends to be a problem with this Explained to the patient what GLP-1 drugs are, the sites of actions and the body, reduction of hunger sensation and improved insulin secretion.  Discussed the benefit of weight loss with these medications. Explained possible side effects of OZEMPIC, most commonly nausea that usually improves over time.  Also explained safety information associated with the medication Demonstrated the medication injection device and injection technique to the patient.  To start the injections with the 0.25 mg dosage weekly for the first 4 injections and then go up to 0.5 mg weekly if no  continued nausea May also need to be reducing insulin coverage at lunch by 2 units while increasing breakfast and dinner coverage by 2 units Unlikely he needs a more rapidly acting insulin compared to Humalog but just adjustment of the dose for now Patient brochure on Ozempic with enclosed co-pay card given  Will give him glucagon injection and the Gvoke hypopen was prescribed, discussed when and how to use this and his wife is present and explained to her the site of injection of the entire dose   HYPOTHYROIDISM: Increase dose to 125 levothyroxine as TSH is high TSH to be rechecked on the next visit  Follow-up during the week after starting the pump   There are no Patient Instructions on file for this visit.   Reather Littler 05/15/2022, 9:03 AM

## 2022-05-16 ENCOUNTER — Encounter: Payer: Self-pay | Admitting: Endocrinology

## 2022-05-21 ENCOUNTER — Encounter: Payer: Medicare Other | Attending: Endocrinology | Admitting: Nutrition

## 2022-05-21 DIAGNOSIS — E1051 Type 1 diabetes mellitus with diabetic peripheral angiopathy without gangrene: Secondary | ICD-10-CM | POA: Insufficient documentation

## 2022-05-21 NOTE — Progress Notes (Signed)
Patient is here with his wife to learn about insulin pumps.  He says he has looked at them on line and wants an OmniPod because of to tubing.  He was shown all 3 pumps and we discussed the advantages and disadvantages of each pump, how they work to control blood sugars, and what sensors will work with each pump.  After discussing each pump, the patient decided against the OmniPod, and is considering both the Medtronic and Mobi.  He was given brochures on each pump.  He does not like the sensors that go with the Medtronic pump, and is now leaning toward the Mobi.  He was told to go on line to look more closely at each pump and their chat rooms to talk with other pump patients.  He agreed to do this.  He was  told how to order the pump of his choice on line, the ordering process, and the cost.   He had no final questions, and will call me when his pump comes in.   He reports having learned to count carbs, but has "gotten away from doing this", and is now only giving units of insulin.  We discussed the advantage of carb counting, and he downloaded the Calorie Zeba app to help him with this.  He did not want any literature about this, at this time.

## 2022-05-21 NOTE — Patient Instructions (Signed)
Call office when pump comes in to schedule training

## 2022-06-06 ENCOUNTER — Telehealth: Payer: Self-pay

## 2022-06-06 ENCOUNTER — Other Ambulatory Visit: Payer: Self-pay | Admitting: Endocrinology

## 2022-06-06 DIAGNOSIS — E1051 Type 1 diabetes mellitus with diabetic peripheral angiopathy without gangrene: Secondary | ICD-10-CM

## 2022-06-06 NOTE — Telephone Encounter (Signed)
Tandem insulin pump order requires Fasting glucose and C-peptide labs.

## 2022-06-06 NOTE — Telephone Encounter (Signed)
PA request received via CMM for Tadalafil 5MG  tablets  PA has been submitted to [employer] Medicare Part D and is pending determination  Key:  [id number]

## 2022-06-14 ENCOUNTER — Encounter: Payer: Self-pay | Admitting: Endocrinology

## 2022-06-18 ENCOUNTER — Other Ambulatory Visit (INDEPENDENT_AMBULATORY_CARE_PROVIDER_SITE_OTHER): Payer: Medicare Other

## 2022-06-18 DIAGNOSIS — E1051 Type 1 diabetes mellitus with diabetic peripheral angiopathy without gangrene: Secondary | ICD-10-CM | POA: Diagnosis not present

## 2022-06-18 DIAGNOSIS — Z794 Long term (current) use of insulin: Secondary | ICD-10-CM

## 2022-06-18 DIAGNOSIS — E1165 Type 2 diabetes mellitus with hyperglycemia: Secondary | ICD-10-CM

## 2022-06-18 LAB — BASIC METABOLIC PANEL
BUN: 18 mg/dL (ref 6–23)
CO2: 28 mEq/L (ref 19–32)
Calcium: 9.5 mg/dL (ref 8.4–10.5)
Chloride: 108 mEq/L (ref 96–112)
Creatinine, Ser: 1.28 mg/dL (ref 0.40–1.50)
GFR: 56.59 mL/min — ABNORMAL LOW (ref >60.00)
Glucose, Bld: 70 mg/dL (ref 70–99)
Potassium: 4.5 mEq/L (ref 3.5–5.1)
Sodium: 144 mEq/L (ref 135–145)

## 2022-06-18 LAB — GLUCOSE, RANDOM: Glucose, Bld: 70 mg/dL (ref 70–99)

## 2022-06-19 LAB — FRUCTOSAMINE: Fructosamine: 319 umol/L — ABNORMAL HIGH (ref 0–285)

## 2022-06-19 LAB — C-PEPTIDE: C-Peptide: <0.1 ng/mL — ABNORMAL LOW (ref 1.1–4.4)

## 2022-06-20 DIAGNOSIS — R3914 Feeling of incomplete bladder emptying: Secondary | ICD-10-CM | POA: Diagnosis not present

## 2022-06-20 DIAGNOSIS — R3916 Straining to void: Secondary | ICD-10-CM | POA: Diagnosis not present

## 2022-06-20 DIAGNOSIS — R3912 Poor urinary stream: Secondary | ICD-10-CM | POA: Diagnosis not present

## 2022-06-20 NOTE — Telephone Encounter (Signed)
DENIED:  Drugs when used for the treatment of sexual dysfunction are excluded from coverage under Medicare rules. Please refer to your Evidence of Coverage (EOC) section that references Part D drug coverage in your pharmacy plan documents for more information.

## 2022-06-21 ENCOUNTER — Telehealth: Payer: Self-pay

## 2022-06-21 NOTE — Telephone Encounter (Signed)
Inbound fax from DME supplier requesting form be completed and faxed with clinical notes. DME supplies ordered via [employer] through online portal. [facility]  Tandem pump and supplies.

## 2022-06-26 ENCOUNTER — Other Ambulatory Visit: Payer: Self-pay | Admitting: Urology

## 2022-06-26 DIAGNOSIS — R351 Nocturia: Secondary | ICD-10-CM

## 2022-06-26 DIAGNOSIS — R3914 Feeling of incomplete bladder emptying: Secondary | ICD-10-CM

## 2022-06-26 DIAGNOSIS — R3912 Poor urinary stream: Secondary | ICD-10-CM

## 2022-06-26 DIAGNOSIS — N401 Enlarged prostate with lower urinary tract symptoms: Secondary | ICD-10-CM

## 2022-06-26 DIAGNOSIS — R3916 Straining to void: Secondary | ICD-10-CM

## 2022-06-28 ENCOUNTER — Encounter (HOSPITAL_COMMUNITY): Payer: Self-pay

## 2022-06-28 ENCOUNTER — Inpatient Hospital Stay (HOSPITAL_COMMUNITY)
Admission: RE | Admit: 2022-06-28 | Discharge: 2022-06-28 | Disposition: A | Discharge status: Home / Self Care | Payer: Self-pay | Source: Ambulatory Visit

## 2022-06-28 ENCOUNTER — Ambulatory Visit (HOSPITAL_COMMUNITY)
Admission: EM | Admit: 2022-06-28 | Discharge: 2022-06-28 | Disposition: A | Discharge status: Home / Self Care | Payer: Medicare Other | Attending: Family Medicine | Admitting: Family Medicine

## 2022-06-28 DIAGNOSIS — N183 Chronic kidney disease, stage 3 unspecified: Secondary | ICD-10-CM | POA: Insufficient documentation

## 2022-06-28 DIAGNOSIS — Z7902 Long term (current) use of antithrombotics/antiplatelets: Secondary | ICD-10-CM | POA: Insufficient documentation

## 2022-06-28 DIAGNOSIS — I2510 Atherosclerotic heart disease of native coronary artery without angina pectoris: Secondary | ICD-10-CM | POA: Insufficient documentation

## 2022-06-28 DIAGNOSIS — I130 Hypertensive heart and chronic kidney disease with heart failure and stage 1 through stage 4 chronic kidney disease, or unspecified chronic kidney disease: Secondary | ICD-10-CM | POA: Insufficient documentation

## 2022-06-28 DIAGNOSIS — E1022 Type 1 diabetes mellitus with diabetic chronic kidney disease: Secondary | ICD-10-CM | POA: Diagnosis not present

## 2022-06-28 DIAGNOSIS — R509 Fever, unspecified: Secondary | ICD-10-CM | POA: Diagnosis present

## 2022-06-28 DIAGNOSIS — J069 Acute upper respiratory infection, unspecified: Secondary | ICD-10-CM | POA: Diagnosis not present

## 2022-06-28 DIAGNOSIS — Z1152 Encounter for screening for COVID-19: Secondary | ICD-10-CM | POA: Insufficient documentation

## 2022-06-28 DIAGNOSIS — Z7984 Long term (current) use of oral hypoglycemic drugs: Secondary | ICD-10-CM | POA: Diagnosis not present

## 2022-06-28 DIAGNOSIS — R059 Cough, unspecified: Secondary | ICD-10-CM | POA: Diagnosis present

## 2022-06-28 DIAGNOSIS — Z87891 Personal history of nicotine dependence: Secondary | ICD-10-CM | POA: Diagnosis not present

## 2022-06-28 DIAGNOSIS — Z794 Long term (current) use of insulin: Secondary | ICD-10-CM | POA: Diagnosis not present

## 2022-06-28 DIAGNOSIS — I5022 Chronic systolic (congestive) heart failure: Secondary | ICD-10-CM | POA: Insufficient documentation

## 2022-06-28 LAB — POCT INFLUENZA A/B
Influenza A, POC: NEGATIVE
Influenza B, POC: NEGATIVE

## 2022-06-28 MED ORDER — CHERATUSSIN AC 100-10 MG/5ML PO SOLN: 5.0000 mL | Freq: Four times a day (QID) | ORAL | 0 refills | Status: DC | PRN

## 2022-06-28 MED ORDER — ONDANSETRON 4 MG PO TBDP: 4.0000 mg | ORAL_TABLET | Freq: Three times a day (TID) | ORAL | 0 refills | Status: DC | PRN

## 2022-06-28 NOTE — ED Provider Notes (Addendum)
[facility]    CSN: [id number] Arrival date & time: 06/28/22  1643      History   Chief Complaint Chief Complaint  Patient presents with   Fever    HPI John Carrillo is a 72 y.o. male.    Fever  Here for cough and congestion and nausea and vomiting and fever and chills.  Symptoms began yesterday afternoon in the first symptoms were nausea and vomiting.  He threw up 3 times.  He also had some burning pain in his throat.  Currently he is not very nauseated but he is having myalgia and chills here in the office.  Highest temperature at home was 100.  He has had some Tylenol today, and is temperature here is 99.4  He is feeling short of breath some  Past medical history significant for diabetes and heart disease  Home COVID swab today was negative  Past Medical History:  Diagnosis Date   Acute kidney injury superimposed on chronic kidney disease (HCC) 01/25/2015   AICD (automatic cardioverter/defibrillator) present    CHF (congestive heart failure) (HCC)    CORONARY ARTERY DISEASE 08/11/2006   CVA 12/19/2006   2008   DIABETES MELLITUS, TYPE I 08/11/2006   ECZEMA, HANDS 12/15/2009   History of kidney stones    10+ years ago   HYPERLIPIDEMIA 08/11/2006   HYPERTENSION 08/11/2006   Hypothyroidism    Myocardial infarction [facility])    Presence of permanent cardiac pacemaker    Type 2 diabetes mellitus without complications (HCC) 12/20/2016    Patient Active Problem List   Diagnosis Date Noted   Chest pain with moderate risk for cardiac etiology 09/02/2021   Type 1 diabetes mellitus with diabetic peripheral angiopathy without gangrene (HCC) 03/14/2020   Diabetes (HCC) 01/15/2020   Erectile dysfunction 05/14/2019   Left groin hernia 02/13/2018   Allergic rhinitis 07/11/2017   Epidermoid cyst of ear 10/22/2016   Chronic systolic dysfunction of left ventricle 01/10/2016   Hypothyroidism 02/17/2015   Chronic systolic CHF (congestive heart failure) (HCC) 01/25/2015    Cardiomyopathy, ischemic 01/17/2015   LBBB (left bundle branch block) 01/17/2015   CKD (chronic kidney disease), stage III (HCC) 01/17/2015   SVT (supraventricular tachycardia) 01/14/2015   Former smoker 06/24/2014   Eczema of both hands 12/15/2009   History of CVA (cerebrovascular accident) 12/19/2006   Dyslipidemia 08/11/2006   Essential hypertension 08/11/2006   CAD S/P LAD PCI'03, RCA DES 01/16/15 08/11/2006    Past Surgical History:  Procedure Laterality Date   CARDIAC CATHETERIZATION N/A 01/16/2015   Procedure: Left Heart Cath and Coronary Angiography;  Surgeon: [name], MD;  Location: [facility] INVASIVE CV LAB;  Service: Cardiovascular;  Laterality: N/A;   CORONARY STENT PLACEMENT     EP IMPLANTABLE DEVICE N/A 01/10/2016   MDT Claria MRI Quad CRTD implanted by Dr [name]   INGUINAL HERNIA REPAIR Left 05/22/2020   Procedure: LEFT INGUINAL HERNIA REPAIR WITH MESH;  Surgeon: Abigail Miyamoto, MD;  Location: [facility] OR;  Service: General;  Laterality: Left;   INSERT / REPLACE / REMOVE PACEMAKER     INSERTION OF MESH Left 05/22/2020   Procedure: INSERTION OF MESH;  Surgeon: Abigail Miyamoto, MD;  Location: [facility] OR;  Service: General;  Laterality: Left;   TEE WITHOUT CARDIOVERSION N/A 01/26/2015   Procedure: TRANSESOPHAGEAL ECHOCARDIOGRAM (TEE);  Surgeon: [name], MD;  Location: [facility] ENDOSCOPY;  Service: Cardiovascular;  Laterality: N/A;   TONSILECTOMY, ADENOIDECTOMY, BILATERAL MYRINGOTOMY AND TUBES     TONSILLECTOMY  WISDOM TOOTH EXTRACTION     WRIST FRACTURE SURGERY Right 1960   3rd grade       Home Medications    Prior to Admission medications   Medication Sig Start Date End Date Taking? Authorizing Provider  guaiFENesin-codeine (CHERATUSSIN AC) 100-10 MG/5ML syrup Take 5 mLs by mouth 4 (four) times daily as needed for cough. 06/28/22  Yes Zenia Resides, MD  ondansetron (ZOFRAN-ODT) 4 MG disintegrating tablet Take 1 tablet (4 mg total) by mouth every 8 (eight) hours  as needed for nausea or vomiting. 06/28/22  Yes Zenia Resides, MD  Blood Glucose Monitoring Suppl (ONETOUCH VERIO) w/Device KIT 1 Device by Does not apply route daily. 10/02/20   Romero Belling, MD  carvedilol (COREG) 6.25 MG tablet TAKE 1 TABLET(6.25 MG) BY MOUTH TWICE DAILY 01/07/22   Graciella Freer, PA-C  CINNAMON PO Take 1 tablet by mouth daily.    [provider]  clopidogrel (PLAVIX) 75 MG tablet TAKE 1 TABLET(75 MG) BY MOUTH DAILY 01/22/22   Allred, Fayrene Fearing, MD  Coenzyme Q10 (COQ10) 200 MG CAPS Take 200 mg by mouth in the morning.    [provider]  Continuous Blood Gluc Sensor (DEXCOM G7 SENSOR) MISC 1 Device by Does not apply route as directed. Change sensor every 10 days 07/17/21   Reather Littler, MD  ENTRESTO 49-51 MG TAKE 1 TABLET BY MOUTH EVERY MORNING AND EVERY NIGHT AT BEDTIME 12/04/21   Wendall Stade, MD  ezetimibe (ZETIA) 10 MG tablet TAKE 1 TABLET(10 MG) BY MOUTH DAILY 08/03/21   [name], MD  FARXIGA 10 MG TABS tablet Take 1 tablet (10 mg total) by mouth daily before breakfast. 04/02/22   Reather Littler, MD  GARLIC PO Take 1 tablet by mouth daily.    [provider]  Glucagon (GVOKE HYPOPEN 2-PACK) 0.5 MG/0.1ML SOAJ Use for severe lows 05/15/22   Reather Littler, MD  glucose blood (ONETOUCH VERIO) test strip 1 each by Other route 2 (two) times daily. And lancets 2/day 10/25/16   Romero Belling, MD  insulin lispro (HUMALOG KWIKPEN) 100 UNIT/ML KwikPen 3 times a day (just before each meal) 7-3-7 units, and pen needles 4/day. Patient taking differently: 3 times a day (just before each meal) 6-8-8 units, and pen needles 4/day. 02/21/22   Reather Littler, MD  Insulin Pen Needle (PEN NEEDLES) 32G X 4 MM MISC Use to inject insulin 4 times a day 02/21/22   Reather Littler, MD  levothyroxine (SYNTHROID) 125 MCG tablet Take 1 tablet (125 mcg total) by mouth daily. 05/15/22   Reather Littler, MD  Probiotic Product (PROBIOTIC PO) Take 1 capsule by mouth every morning.     [provider]  rosuvastatin (CRESTOR) 10 MG tablet TAKE 1 TABLET(10 MG) BY MOUTH DAILY 03/18/22   Wendall Stade, MD  tadalafil (CIALIS) 5 MG tablet TAKE 1 TABLET(5 MG) BY MOUTH DAILY 04/29/22   [name], MD  tamsulosin (FLOMAX) 0.4 MG CAPS capsule Take 0.4 mg by mouth daily. 04/25/22   [provider]  TOUJEO SOLOSTAR 300 UNIT/ML Solostar Pen ADMINISTER 38 UNITS UNDER THE SKIN EVERY DAY Patient taking differently: Inject 26 Units into the skin daily at 6 (six) AM. 02/21/22   Reather Littler, MD    Family History Family History  Problem Relation Age of Onset   CAD Father    Other Mother        passed right before 48- tumor on kidney recent diagnosis   Dementia Mother  progressing rapidly   CAD Paternal Grandfather        41s   CAD Paternal Grandmother        7s   Brain cancer Maternal Grandmother    Lymphoma Sister        b cell   Celiac disease Sister    Diabetes Neg Hx     Social History Social History   Tobacco Use   Smoking status: Former    Packs/day: 1.00    Years: 30.00    Additional pack years: 0.00    Total pack years: 30.00    Types: Cigarettes    Quit date: 05/03/2000    Years since quitting: 22.1   Smokeless tobacco: Never  Vaping Use   Vaping Use: Never used  Substance Use Topics   Alcohol use: Yes    Alcohol/week: 3.0 standard drinks of alcohol    Types: 1 Glasses of wine, 1 Cans of beer, 1 Shots of liquor per week    Comment: 1-2/month   Drug use: No     Allergies   Patient has no known allergies.   Review of Systems Review of Systems  Constitutional:  Positive for fever.     Physical Exam Triage Vital Signs ED Triage Vitals  Enc Vitals Group     BP 06/28/22 1719 131/72     Pulse Rate 06/28/22 1719 90     Resp 06/28/22 1719 18     Temp 06/28/22 1719 99.4 F (37.4 C)     Temp src --      SpO2 06/28/22 1719 95 %     Weight --      Height --      Head Circumference --      Peak Flow --      Pain Score  06/28/22 1721 6     Pain Loc --      Pain Edu? --      Excl. in GC? --    No data found.  Updated Vital Signs BP 131/72 (BP Location: Right Arm)   Pulse 90   Temp 99.4 F (37.4 C)   Resp 18   SpO2 95%   Visual Acuity Right Eye Distance:   Left Eye Distance:   Bilateral Distance:    Right Eye Near:   Left Eye Near:    Bilateral Near:     Physical Exam Vitals reviewed.  Constitutional:      General: He is not in acute distress.    Appearance: He is not toxic-appearing.  HENT:     Right Ear: Tympanic membrane and ear canal normal.     Left Ear: Tympanic membrane and ear canal normal.     Nose: Nose normal.     Mouth/Throat:     Mouth: Mucous membranes are moist.     Comments: There is mild erythema of the posterior oropharynx.  Clear mucus draining.  No tonsillar hypertrophy Eyes:     Extraocular Movements: Extraocular movements intact.     Conjunctiva/sclera: Conjunctivae normal.     Pupils: Pupils are equal, round, and reactive to light.  Cardiovascular:     Rate and Rhythm: Normal rate and regular rhythm.     Heart sounds: No murmur heard. Pulmonary:     Effort: No respiratory distress.     Breath sounds: No stridor. No wheezing, rhonchi or rales.  Musculoskeletal:     Cervical back: Neck supple.  Lymphadenopathy:     Cervical: No cervical adenopathy.  Skin:  Capillary Refill: Capillary refill takes less than 2 seconds.     Coloration: Skin is not jaundiced or pale.  Neurological:     General: No focal deficit present.     Mental Status: He is alert and oriented to person, place, and time.  Psychiatric:        Behavior: Behavior normal.      UC Treatments / Results  Labs (all labs ordered are listed, but only abnormal results are displayed) Labs Reviewed  SARS CORONAVIRUS 2 (TAT 6-24 HRS)  POCT INFLUENZA A/B    EKG   Radiology No results found.  Procedures Procedures (including critical care time)  Medications Ordered in UC Medications  - No data to display  Initial Impression / Assessment and Plan / UC Course  I have reviewed the triage vital signs and the nursing notes.  Pertinent labs & imaging results that were available during my care of the patient were reviewed by me and considered in my medical decision making (see chart for details).        Flu test is negative.  He is swabbed for COVID, and if positive we will notify him.  Since he takes Plavix, he would not be able to take Paxlovid.  If he is positive for COVID, he should have a prescription for molnupiravir  Robitussin with codeine was sent in for the cough and Zofran is sent in case he has any more nausea Final Clinical Impressions(s) / UC Diagnoses   Final diagnoses:  Viral upper respiratory tract infection     Discharge Instructions      Flu test is negative.  Robitussin with codeine cough syrup--take 5 mL or 1 teaspoon every 6 hours as needed for cough.  Ondansetron dissolved in the mouth every 8 hours as needed for nausea or vomiting. Clear liquids(water, gatorade/pedialyte, ginger ale/sprite, chicken broth/soup) and bland things(crackers/toast, rice, potato, bananas) to eat. Avoid acidic foods like lemon/lime/orange/tomato, and avoid greasy/spicy foods.   You have been swabbed for COVID, and the test will result in the next 24 hours. Our staff will call you if positive. If the COVID test is positive, you should quarantine until you are fever free for 24 hours and you are starting to feel better, and then take added precautions for the next 5 days, such as physical distancing/wearing a mask and good hand hygiene/washing.      ED Prescriptions     Medication Sig Dispense Auth. Provider   guaiFENesin-codeine (CHERATUSSIN AC) 100-10 MG/5ML syrup Take 5 mLs by mouth 4 (four) times daily as needed for cough. 120 mL Zenia Resides, MD   ondansetron (ZOFRAN-ODT) 4 MG disintegrating tablet Take 1 tablet (4 mg total) by mouth every 8 (eight)  hours as needed for nausea or vomiting. 10 tablet Marlinda Mike Janace Aris, MD      I have reviewed the PDMP during this encounter.   Zenia Resides, MD 06/28/22 Aldona Lento    Zenia Resides, MD 06/28/22 Julio Sicks    Zenia Resides, MD 06/28/22 [phone]

## 2022-06-28 NOTE — ED Triage Notes (Signed)
Pt states fever,chills,body aches,cough for the past 24 hours. Has been taking tylenol at home for his fever. Had a negative home covid test.

## 2022-06-28 NOTE — Discharge Instructions (Addendum)
Flu test is negative.  Robitussin with codeine cough syrup--take 5 mL or 1 teaspoon every 6 hours as needed for cough.  Ondansetron dissolved in the mouth every 8 hours as needed for nausea or vomiting. Clear liquids(water, gatorade/pedialyte, ginger ale/sprite, chicken broth/soup) and bland things(crackers/toast, rice, potato, bananas) to eat. Avoid acidic foods like lemon/lime/orange/tomato, and avoid greasy/spicy foods.   You have been swabbed for COVID, and the test will result in the next 24 hours. Our staff will call you if positive. If the COVID test is positive, you should quarantine until you are fever free for 24 hours and you are starting to feel better, and then take added precautions for the next 5 days, such as physical distancing/wearing a mask and good hand hygiene/washing.

## 2022-06-29 ENCOUNTER — Other Ambulatory Visit: Payer: Self-pay | Admitting: Endocrinology

## 2022-06-29 DIAGNOSIS — E1165 Type 2 diabetes mellitus with hyperglycemia: Secondary | ICD-10-CM

## 2022-06-29 LAB — SARS CORONAVIRUS 2 (TAT 6-24 HRS): SARS Coronavirus 2: NEGATIVE

## 2022-07-01 ENCOUNTER — Other Ambulatory Visit: Payer: Self-pay | Admitting: Endocrinology

## 2022-07-01 ENCOUNTER — Emergency Department (HOSPITAL_COMMUNITY): Payer: Medicare Other

## 2022-07-01 ENCOUNTER — Other Ambulatory Visit: Payer: Self-pay

## 2022-07-01 ENCOUNTER — Encounter (HOSPITAL_COMMUNITY): Payer: Self-pay

## 2022-07-01 ENCOUNTER — Emergency Department (HOSPITAL_COMMUNITY)
Admission: EM | Admit: 2022-07-01 | Discharge: 2022-07-01 | Disposition: A | Discharge status: Home / Self Care | Payer: Medicare Other | Attending: Emergency Medicine | Admitting: Emergency Medicine

## 2022-07-01 ENCOUNTER — Ambulatory Visit (HOSPITAL_COMMUNITY)
Admission: RE | Admit: 2022-07-01 | Discharge: 2022-07-01 | Disposition: A | Discharge status: Home / Self Care | Payer: Medicare Other | Source: Ambulatory Visit | Attending: Family Medicine | Admitting: Family Medicine

## 2022-07-01 VITALS — BP 98/69 | HR 67 | Temp 98.1°F | Resp 19 | Ht 69.0 in | Wt 189.6 lb

## 2022-07-01 DIAGNOSIS — J189 Pneumonia, unspecified organism: Secondary | ICD-10-CM | POA: Diagnosis not present

## 2022-07-01 DIAGNOSIS — I10 Essential (primary) hypertension: Secondary | ICD-10-CM | POA: Diagnosis not present

## 2022-07-01 DIAGNOSIS — E860 Dehydration: Secondary | ICD-10-CM

## 2022-07-01 DIAGNOSIS — D72829 Elevated white blood cell count, unspecified: Secondary | ICD-10-CM | POA: Diagnosis not present

## 2022-07-01 DIAGNOSIS — I2510 Atherosclerotic heart disease of native coronary artery without angina pectoris: Secondary | ICD-10-CM | POA: Diagnosis not present

## 2022-07-01 DIAGNOSIS — Z7901 Long term (current) use of anticoagulants: Secondary | ICD-10-CM | POA: Insufficient documentation

## 2022-07-01 DIAGNOSIS — Z79899 Other long term (current) drug therapy: Secondary | ICD-10-CM | POA: Diagnosis not present

## 2022-07-01 DIAGNOSIS — Z794 Long term (current) use of insulin: Secondary | ICD-10-CM | POA: Diagnosis not present

## 2022-07-01 DIAGNOSIS — R531 Weakness: Secondary | ICD-10-CM | POA: Diagnosis not present

## 2022-07-01 DIAGNOSIS — R059 Cough, unspecified: Secondary | ICD-10-CM | POA: Diagnosis not present

## 2022-07-01 LAB — COMPREHENSIVE METABOLIC PANEL
ALT: 25 U/L (ref 0–44)
AST: 36 U/L (ref 15–41)
Albumin: 3 g/dL — ABNORMAL LOW (ref 3.5–5.0)
Alkaline Phosphatase: 52 U/L (ref 38–126)
Anion gap: 11 (ref 5–15)
BUN: 27 mg/dL — ABNORMAL HIGH (ref 8–23)
CO2: 24 mmol/L (ref 22–32)
Calcium: 8.3 mg/dL — ABNORMAL LOW (ref 8.9–10.3)
Chloride: 98 mmol/L (ref 98–111)
Creatinine, Ser: 1.47 mg/dL — ABNORMAL HIGH (ref 0.61–1.24)
GFR, Estimated: 51 mL/min — ABNORMAL LOW (ref >60)
Glucose, Bld: 192 mg/dL — ABNORMAL HIGH (ref 70–99)
Potassium: 4.1 mmol/L (ref 3.5–5.1)
Sodium: 133 mmol/L — ABNORMAL LOW (ref 135–145)
Total Bilirubin: 0.8 mg/dL (ref 0.3–1.2)
Total Protein: 6.6 g/dL (ref 6.5–8.1)

## 2022-07-01 LAB — CBC WITH DIFFERENTIAL/PLATELET
Abs Immature Granulocytes: 0.24 10*3/uL — ABNORMAL HIGH (ref 0.00–0.07)
Basophils Absolute: 0 10*3/uL (ref 0.0–0.1)
Basophils Relative: 0 %
Eosinophils Absolute: 0.1 10*3/uL (ref 0.0–0.5)
Eosinophils Relative: 1 %
HCT: 42.2 % (ref 39.0–52.0)
Hemoglobin: 14.2 g/dL (ref 13.0–17.0)
Immature Granulocytes: 2 %
Lymphocytes Relative: 9 %
Lymphs Abs: 1 10*3/uL (ref 0.7–4.0)
MCH: 32.9 pg (ref 26.0–34.0)
MCHC: 33.6 g/dL (ref 30.0–36.0)
MCV: 97.7 fL (ref 80.0–100.0)
Monocytes Absolute: 1.8 10*3/uL — ABNORMAL HIGH (ref 0.1–1.0)
Monocytes Relative: 15 %
Neutro Abs: 9.1 10*3/uL — ABNORMAL HIGH (ref 1.7–7.7)
Neutrophils Relative %: 73 %
Platelets: 142 10*3/uL — ABNORMAL LOW (ref 150–400)
RBC: 4.32 MIL/uL (ref 4.22–5.81)
RDW: 12.5 % (ref 11.5–15.5)
WBC: 12.3 10*3/uL — ABNORMAL HIGH (ref 4.0–10.5)
nRBC: 0 % (ref 0.0–0.2)

## 2022-07-01 MED ORDER — SODIUM CHLORIDE 0.9 % IV SOLN
2.0000 g | Freq: Once | INTRAVENOUS | Status: AC
  Administered 2022-07-01: 2 g via INTRAVENOUS
  Filled 2022-07-01: qty 20

## 2022-07-01 MED ORDER — SODIUM CHLORIDE 0.9 % IV BOLUS
500.0000 mL | Freq: Once | INTRAVENOUS | Status: AC
  Administered 2022-07-01: 500 mL via INTRAVENOUS

## 2022-07-01 MED ORDER — SODIUM CHLORIDE 0.9 % IV SOLN
500.0000 mg | Freq: Once | INTRAVENOUS | Status: AC
  Administered 2022-07-01: 500 mg via INTRAVENOUS
  Filled 2022-07-01: qty 5

## 2022-07-01 MED ORDER — DOXYCYCLINE HYCLATE 100 MG PO CAPS: 100.0000 mg | ORAL_CAPSULE | Freq: Two times a day (BID) | ORAL | 0 refills | Status: DC

## 2022-07-01 MED ORDER — OXYCODONE HCL 5 MG PO TABS: 5.0000 mg | ORAL_TABLET | Freq: Three times a day (TID) | ORAL | 0 refills | Status: AC | PRN

## 2022-07-01 MED ORDER — OXYCODONE HCL 5 MG PO TABS
5.0000 mg | ORAL_TABLET | Freq: Once | ORAL | Status: AC
  Administered 2022-07-01: 5 mg via ORAL
  Filled 2022-07-01: qty 1

## 2022-07-01 MED ORDER — SODIUM CHLORIDE 0.9 % IV BOLUS: 1000.0000 mL | Freq: Once | INTRAVENOUS | Status: DC

## 2022-07-01 NOTE — ED Triage Notes (Signed)
Per patient  Fever  Since Friday 100-102 temp Cough Takes an ace inhibitor Chronic Unsure if it is related General malaise Joint Pain Ankle Wrist UC recommend Xray Possible PNA IVF Blood work Tested for covid/flu Friday Negative  **has been around someone with similar symptoms (they were dx with a virus)

## 2022-07-01 NOTE — ED Triage Notes (Signed)
Pt was seen here on Friday states he is not getting any better, persistent join pain, cough, fever and nausea.

## 2022-07-01 NOTE — Discharge Instructions (Addendum)
Take next dose of antibiotic in the morning. Please return if symptoms worsen as discussed.  You should get a repeat chest x-ray at some point to make sure that pneumonia findings have resolved as well.  Discussed with your primary care doctor.

## 2022-07-01 NOTE — ED Provider Notes (Signed)
[place] EMERGENCY DEPARTMENT AT [facility] Provider Note   CSN: [id number] Arrival date & time: 07/01/22  1445     History  Chief Complaint  Patient presents with   URI    John Carrillo is a 72 y.o. male.  Patient here with cough.  Sent for possible pneumonia.  Fever for 3 days with cough.  Body aches and viral symptoms as well.  Had a negative COVID test initially 3 days ago at urgent care.  Denies any nausea vomiting diarrhea.  No abdominal pain.  History of hypertension, high cholesterol, CAD.  The history is provided by the patient.       Home Medications Prior to Admission medications   Medication Sig Start Date End Date Taking? Authorizing Provider  doxycycline (VIBRAMYCIN) 100 MG capsule Take 1 capsule (100 mg total) by mouth 2 (two) times daily. 07/01/22  Yes Jaid Quirion, DO  Blood Glucose Monitoring Suppl (ONETOUCH VERIO) w/Device KIT 1 Device by Does not apply route daily. 10/02/20   Romero Belling, MD  carvedilol (COREG) 6.25 MG tablet TAKE 1 TABLET(6.25 MG) BY MOUTH TWICE DAILY 01/07/22   Graciella Freer, PA-C  CINNAMON PO Take 1 tablet by mouth daily.    [provider]  clopidogrel (PLAVIX) 75 MG tablet TAKE 1 TABLET(75 MG) BY MOUTH DAILY 01/22/22   Allred, Fayrene Fearing, MD  Coenzyme Q10 (COQ10) 200 MG CAPS Take 200 mg by mouth in the morning.    [provider]  Continuous Blood Gluc Sensor (DEXCOM G7 SENSOR) MISC 1 Device by Does not apply route as directed. Change sensor every 10 days 07/17/21   Reather Littler, MD  ENTRESTO 49-51 MG TAKE 1 TABLET BY MOUTH EVERY MORNING AND EVERY NIGHT AT BEDTIME 12/04/21   Wendall Stade, MD  ezetimibe (ZETIA) 10 MG tablet TAKE 1 TABLET(10 MG) BY MOUTH DAILY 08/03/21   [name], MD  FARXIGA 10 MG TABS tablet Take 1 tablet (10 mg total) by mouth daily before breakfast. 04/02/22   Reather Littler, MD  GARLIC PO Take 1 tablet by mouth daily.    [provider]  Glucagon (GVOKE HYPOPEN  2-PACK) 0.5 MG/0.1ML SOAJ Use for severe lows 05/15/22   Reather Littler, MD  glucose blood (ONETOUCH VERIO) test strip 1 each by Other route 2 (two) times daily. And lancets 2/day 10/25/16   Romero Belling, MD  guaiFENesin-codeine [facility]) 100-10 MG/5ML syrup Take 5 mLs by mouth 4 (four) times daily as needed for cough. 06/28/22   Zenia Resides, MD  HUMALOG KWIKPEN 100 UNIT/ML KwikPen INJECT UNDER THE SKIN 7 UNITS EVERY MORNING, 3 UNITS AT LUNCH, AND 7 UNITS AT DINNER. 06/30/22   Reather Littler, MD  Insulin Pen Needle (PEN NEEDLES) 32G X 4 MM MISC Use to inject insulin 4 times a day 02/21/22   Reather Littler, MD  levothyroxine (SYNTHROID) 125 MCG tablet Take 1 tablet (125 mcg total) by mouth daily. 05/15/22   Reather Littler, MD  ondansetron (ZOFRAN-ODT) 4 MG disintegrating tablet Take 1 tablet (4 mg total) by mouth every 8 (eight) hours as needed for nausea or vomiting. 06/28/22   Zenia Resides, MD  Probiotic Product (PROBIOTIC PO) Take 1 capsule by mouth every morning.    [provider]  rosuvastatin (CRESTOR) 10 MG tablet TAKE 1 TABLET(10 MG) BY MOUTH DAILY 03/18/22   Wendall Stade, MD  tadalafil (CIALIS) 5 MG tablet TAKE 1 TABLET(5 MG) BY MOUTH DAILY 04/29/22   Tana Conch  O, MD  tamsulosin (FLOMAX) 0.4 MG CAPS capsule Take 0.4 mg by mouth daily. 04/25/22   [provider]  TOUJEO SOLOSTAR 300 UNIT/ML Solostar Pen ADMINISTER 38 UNITS UNDER THE SKIN EVERY DAY Patient taking differently: Inject 26 Units into the skin daily at 6 (six) AM. 02/21/22   Reather Littler, MD      Allergies    Patient has no known allergies.    Review of Systems   Review of Systems  Physical Exam Updated Vital Signs BP (!) 148/63 (BP Location: Right Arm)   Pulse 65   Temp 98.4 F (36.9 C) (Oral)   Resp 11   Ht 5\' 9"  (1.753 m)   Wt 78 kg   SpO2 95%   BMI 25.40 kg/m  Physical Exam Vitals and nursing note reviewed.  Constitutional:      General: He is not in acute distress.    Appearance: He  is well-developed.  HENT:     Head: Normocephalic and atraumatic.     Nose: Nose normal.     Mouth/Throat:     Mouth: Mucous membranes are moist.  Eyes:     Extraocular Movements: Extraocular movements intact.     Conjunctiva/sclera: Conjunctivae normal.     Pupils: Pupils are equal, round, and reactive to light.  Cardiovascular:     Rate and Rhythm: Normal rate and regular rhythm.     Pulses: Normal pulses.     Heart sounds: Normal heart sounds. No murmur heard. Pulmonary:     Effort: Pulmonary effort is normal. No respiratory distress.     Breath sounds: Normal breath sounds.  Abdominal:     Palpations: Abdomen is soft.     Tenderness: There is no abdominal tenderness.  Musculoskeletal:        General: No swelling.     Cervical back: Neck supple.  Skin:    General: Skin is warm and dry.     Capillary Refill: Capillary refill takes less than 2 seconds.  Neurological:     Mental Status: He is alert.  Psychiatric:        Mood and Affect: Mood normal.     ED Results / Procedures / Treatments   Labs (all labs ordered are listed, but only abnormal results are displayed) Labs Reviewed  CBC WITH DIFFERENTIAL/PLATELET - Abnormal; Notable for the following components:      Result Value   WBC 12.3 (*)    Platelets 142 (*)    Neutro Abs 9.1 (*)    Monocytes Absolute 1.8 (*)    Abs Immature Granulocytes 0.24 (*)    All other components within normal limits  COMPREHENSIVE METABOLIC PANEL - Abnormal; Notable for the following components:   Sodium 133 (*)    Glucose, Bld 192 (*)    BUN 27 (*)    Creatinine, Ser 1.47 (*)    Calcium 8.3 (*)    Albumin 3.0 (*)    GFR, Estimated 51 (*)    All other components within normal limits    EKG None  Radiology DG Chest Portable 1 View  Result Date: 07/01/2022 CLINICAL DATA:  Cough. EXAM: PORTABLE CHEST 1 VIEW COMPARISON:  September 02, 2021 FINDINGS: Normal cardiac silhouette. Tortuosity and calcific atherosclerotic disease of the  aorta. Biventricular pacemaker is unchanged in position. Dense airspace consolidation in the right upper lobe. IMPRESSION: Dense airspace consolidation in the right upper lobe. This may represent unifocal pneumonia, however post obstructive atelectasis or consolidation cannot be excluded. Please follow-up to  solution. If resolution could not be documented radiographically, CT of the chest is recommended. Electronically Signed   By: Ted Mcalpine M.D.   On: 07/01/2022 15:47    Procedures Procedures    Medications Ordered in ED Medications  azithromycin (ZITHROMAX) 500 mg in sodium chloride 0.9 % 250 mL IVPB (500 mg Intravenous New Bag/Given 07/01/22 1739)  sodium chloride 0.9 % bolus 500 mL (0 mLs Intravenous Stopped 07/01/22 1656)  cefTRIAXone (ROCEPHIN) 2 g in sodium chloride 0.9 % 100 mL IVPB (0 g Intravenous Stopped 07/01/22 1759)  oxyCODONE (Oxy IR/ROXICODONE) immediate release tablet 5 mg (5 mg Oral Given 07/01/22 1655)    ED Course/ Medical Decision Making/ A&P                             Medical Decision Making Amount and/or Complexity of Data Reviewed Labs: ordered. Radiology: ordered.  Risk Prescription drug management.   John Carrillo is here with cough and fever.  Normal vitals.  No fever.  He had a fever on and off for 3 days.  I have no concern for sepsis.  Suspect that this is likely a viral source of pneumonia.  Will get CBC BMP chest x-ray and give IV fluid bolus.  Chest x-ray per my review and interpretation appears consistent with a unifocal pneumonia.  He is got mild leukocytosis but otherwise lab works unremarkable.  I gave him a dose IV antibiotics here and will start him on oral antibiotics and have him follow-up closely with his primary care doctor.  He would likely need a repeat x-ray to make sure things are improving.  Discharged in good condition.  This chart was dictated using voice recognition software.  Despite best efforts to proofread,  errors can  occur which can change the documentation meaning.         Final Clinical Impression(s) / ED Diagnoses Final diagnoses:  Community acquired pneumonia, unspecified laterality    Rx / DC Orders ED Discharge Orders          Ordered    doxycycline (VIBRAMYCIN) 100 MG capsule  2 times daily        07/01/22 1810              Virgina Norfolk, DO 07/01/22 1811

## 2022-07-01 NOTE — Discharge Instructions (Signed)
Patient will proceed to the emergency room with his wife driving for further evaluation

## 2022-07-01 NOTE — ED Provider Notes (Signed)
[facility]    CSN: [id number] Arrival date & time: 07/01/22  1348      History   Chief Complaint Chief Complaint  Patient presents with   Fever    Was seen Friday.  Still have fever - Entered by patient    HPI John Carrillo is a 72 y.o. male.    Fever  Here for continued congestion and fever and weakness.  He has also felt dizzy.  He was seen here on May 24 by me.  At that time he had had 2 or 3 days of nausea and vomiting and congestion and cough.  Vital signs were normal at the time.  Lung exam was normal.  COVID swab was negative  He was provided a prescription for Zofran which she states did help.  He is feeling weaker now.  He continues to cough.  He is also having some joint pain.  Past Medical History:  Diagnosis Date   Acute kidney injury superimposed on chronic kidney disease (HCC) 01/25/2015   AICD (automatic cardioverter/defibrillator) present    CHF (congestive heart failure) (HCC)    CORONARY ARTERY DISEASE 08/11/2006   CVA 12/19/2006   2008   DIABETES MELLITUS, TYPE I 08/11/2006   ECZEMA, HANDS 12/15/2009   History of kidney stones    10+ years ago   HYPERLIPIDEMIA 08/11/2006   HYPERTENSION 08/11/2006   Hypothyroidism    Myocardial infarction [facility])    Presence of permanent cardiac pacemaker    Type 2 diabetes mellitus without complications (HCC) 12/20/2016    Patient Active Problem List   Diagnosis Date Noted   Chest pain with moderate risk for cardiac etiology 09/02/2021   Type 1 diabetes mellitus with diabetic peripheral angiopathy without gangrene (HCC) 03/14/2020   Diabetes (HCC) 01/15/2020   Erectile dysfunction 05/14/2019   Left groin hernia 02/13/2018   Allergic rhinitis 07/11/2017   Epidermoid cyst of ear 10/22/2016   Chronic systolic dysfunction of left ventricle 01/10/2016   Hypothyroidism 02/17/2015   Chronic systolic CHF (congestive heart failure) (HCC) 01/25/2015   Cardiomyopathy, ischemic 01/17/2015   LBBB (left bundle  branch block) 01/17/2015   CKD (chronic kidney disease), stage III (HCC) 01/17/2015   SVT (supraventricular tachycardia) 01/14/2015   Former smoker 06/24/2014   Eczema of both hands 12/15/2009   History of CVA (cerebrovascular accident) 12/19/2006   Dyslipidemia 08/11/2006   Essential hypertension 08/11/2006   CAD S/P LAD PCI'03, RCA DES 01/16/15 08/11/2006    Past Surgical History:  Procedure Laterality Date   CARDIAC CATHETERIZATION N/A 01/16/2015   Procedure: Left Heart Cath and Coronary Angiography;  Surgeon: [name], MD;  Location: [facility] INVASIVE CV LAB;  Service: Cardiovascular;  Laterality: N/A;   CORONARY STENT PLACEMENT     EP IMPLANTABLE DEVICE N/A 01/10/2016   MDT Claria MRI Quad CRTD implanted by Dr [name]   INGUINAL HERNIA REPAIR Left 05/22/2020   Procedure: LEFT INGUINAL HERNIA REPAIR WITH MESH;  Surgeon: Abigail Miyamoto, MD;  Location: [facility] OR;  Service: General;  Laterality: Left;   INSERT / REPLACE / REMOVE PACEMAKER     INSERTION OF MESH Left 05/22/2020   Procedure: INSERTION OF MESH;  Surgeon: Abigail Miyamoto, MD;  Location: [facility] OR;  Service: General;  Laterality: Left;   TEE WITHOUT CARDIOVERSION N/A 01/26/2015   Procedure: TRANSESOPHAGEAL ECHOCARDIOGRAM (TEE);  Surgeon: [name], MD;  Location: [facility] ENDOSCOPY;  Service: Cardiovascular;  Laterality: N/A;   TONSILECTOMY, ADENOIDECTOMY, BILATERAL MYRINGOTOMY AND TUBES     TONSILLECTOMY  WISDOM TOOTH EXTRACTION     WRIST FRACTURE SURGERY Right 1960   3rd grade       Home Medications    Prior to Admission medications   Medication Sig Start Date End Date Taking? Authorizing Provider  Blood Glucose Monitoring Suppl (ONETOUCH VERIO) w/Device KIT 1 Device by Does not apply route daily. 10/02/20   Romero Belling, MD  carvedilol (COREG) 6.25 MG tablet TAKE 1 TABLET(6.25 MG) BY MOUTH TWICE DAILY 01/07/22   Graciella Freer, PA-C  CINNAMON PO Take 1 tablet by mouth daily.    [provider]   clopidogrel (PLAVIX) 75 MG tablet TAKE 1 TABLET(75 MG) BY MOUTH DAILY 01/22/22   Allred, Fayrene Fearing, MD  Coenzyme Q10 (COQ10) 200 MG CAPS Take 200 mg by mouth in the morning.    [provider]  Continuous Blood Gluc Sensor (DEXCOM G7 SENSOR) MISC 1 Device by Does not apply route as directed. Change sensor every 10 days 07/17/21   Reather Littler, MD  ENTRESTO 49-51 MG TAKE 1 TABLET BY MOUTH EVERY MORNING AND EVERY NIGHT AT BEDTIME 12/04/21   Wendall Stade, MD  ezetimibe (ZETIA) 10 MG tablet TAKE 1 TABLET(10 MG) BY MOUTH DAILY 08/03/21   [name], MD  FARXIGA 10 MG TABS tablet Take 1 tablet (10 mg total) by mouth daily before breakfast. 04/02/22   Reather Littler, MD  GARLIC PO Take 1 tablet by mouth daily.    [provider]  Glucagon (GVOKE HYPOPEN 2-PACK) 0.5 MG/0.1ML SOAJ Use for severe lows 05/15/22   Reather Littler, MD  glucose blood (ONETOUCH VERIO) test strip 1 each by Other route 2 (two) times daily. And lancets 2/day 10/25/16   Romero Belling, MD  guaiFENesin-codeine [facility]) 100-10 MG/5ML syrup Take 5 mLs by mouth 4 (four) times daily as needed for cough. 06/28/22   Zenia Resides, MD  HUMALOG KWIKPEN 100 UNIT/ML KwikPen INJECT UNDER THE SKIN 7 UNITS EVERY MORNING, 3 UNITS AT LUNCH, AND 7 UNITS AT DINNER. 06/30/22   Reather Littler, MD  Insulin Pen Needle (PEN NEEDLES) 32G X 4 MM MISC Use to inject insulin 4 times a day 02/21/22   Reather Littler, MD  levothyroxine (SYNTHROID) 125 MCG tablet Take 1 tablet (125 mcg total) by mouth daily. 05/15/22   Reather Littler, MD  ondansetron (ZOFRAN-ODT) 4 MG disintegrating tablet Take 1 tablet (4 mg total) by mouth every 8 (eight) hours as needed for nausea or vomiting. 06/28/22   Zenia Resides, MD  Probiotic Product (PROBIOTIC PO) Take 1 capsule by mouth every morning.    [provider]  rosuvastatin (CRESTOR) 10 MG tablet TAKE 1 TABLET(10 MG) BY MOUTH DAILY 03/18/22   Wendall Stade, MD  tadalafil (CIALIS) 5 MG tablet TAKE 1  TABLET(5 MG) BY MOUTH DAILY 04/29/22   [name], MD  tamsulosin (FLOMAX) 0.4 MG CAPS capsule Take 0.4 mg by mouth daily. 04/25/22   [provider]  TOUJEO SOLOSTAR 300 UNIT/ML Solostar Pen ADMINISTER 38 UNITS UNDER THE SKIN EVERY DAY Patient taking differently: Inject 26 Units into the skin daily at 6 (six) AM. 02/21/22   Reather Littler, MD    Family History Family History  Problem Relation Age of Onset   CAD Father    Other Mother        passed right before 17- tumor on kidney recent diagnosis   Dementia Mother        progressing rapidly   CAD Paternal Grandfather  76s   CAD Paternal Grandmother        22s   Brain cancer Maternal Grandmother    Lymphoma Sister        b cell   Celiac disease Sister    Diabetes Neg Hx     Social History Social History   Tobacco Use   Smoking status: Former    Packs/day: 1.00    Years: 30.00    Additional pack years: 0.00    Total pack years: 30.00    Types: Cigarettes    Quit date: 05/03/2000    Years since quitting: 22.1   Smokeless tobacco: Never  Vaping Use   Vaping Use: Never used  Substance Use Topics   Alcohol use: Yes    Alcohol/week: 3.0 standard drinks of alcohol    Types: 1 Glasses of wine, 1 Cans of beer, 1 Shots of liquor per week    Comment: 1-2/month   Drug use: No     Allergies   Patient has no known allergies.   Review of Systems Review of Systems  Constitutional:  Positive for fever.     Physical Exam Triage Vital Signs ED Triage Vitals  Enc Vitals Group     BP 07/01/22 1358 98/69     Pulse Rate 07/01/22 1358 67     Resp 07/01/22 1358 19     Temp 07/01/22 1358 98.1 F (36.7 C)     Temp Source 07/01/22 1358 Oral     SpO2 07/01/22 1358 96 %     Weight 07/01/22 1359 189 lb 9.5 oz (86 kg)     Height 07/01/22 1359 5\' 9"  (1.753 m)     Head Circumference --      Peak Flow --      Pain Score 07/01/22 1358 8     Pain Loc --      Pain Edu? --      Excl. in GC? --    No data  found.  Updated Vital Signs BP 98/69 (BP Location: Right Arm)   Pulse 67   Temp 98.1 F (36.7 C) (Oral)   Resp 19   Ht 5\' 9"  (1.753 m)   Wt 86 kg   SpO2 96%   BMI 28.00 kg/m   Visual Acuity Right Eye Distance:   Left Eye Distance:   Bilateral Distance:    Right Eye Near:   Left Eye Near:    Bilateral Near:     Physical Exam Vitals reviewed.  Constitutional:      General: He is not in acute distress.    Appearance: He is ill-appearing.     Comments: He is not diaphoretic and he is alert and oriented.  HENT:     Mouth/Throat:     Mouth: Mucous membranes are moist.  Eyes:     Extraocular Movements: Extraocular movements intact.     Conjunctiva/sclera: Conjunctivae normal.     Pupils: Pupils are equal, round, and reactive to light.  Cardiovascular:     Rate and Rhythm: Normal rate and regular rhythm.  Pulmonary:     Effort: No respiratory distress.     Breath sounds: No stridor. No wheezing, rhonchi or rales.  Musculoskeletal:     Cervical back: Neck supple.  Skin:    Capillary Refill: Capillary refill takes less than 2 seconds.     Coloration: Skin is not jaundiced or pale.  Neurological:     General: No focal deficit present.     Mental Status: He  is oriented to person, place, and time.  Psychiatric:        Behavior: Behavior normal.      UC Treatments / Results  Labs (all labs ordered are listed, but only abnormal results are displayed) Labs Reviewed - No data to display  EKG   Radiology No results found.  Procedures Procedures (including critical care time)  Medications Ordered in UC Medications - No data to display  Initial Impression / Assessment and Plan / UC Course  I have reviewed the triage vital signs and the nursing notes.  Pertinent labs & imaging results that were available during my care of the patient were reviewed by me and considered in my medical decision making (see chart for details).    Blood pressure is 98/69, which is  about 40 points different from where he usually runs in previous office visits.  I discussed with him that I am concerned he is at least dehydrated and that he may have pneumonia.  He is going to proceed to the emergency room for urgent evaluation and treatment.  Final Clinical Impressions(s) / UC Diagnoses   Final diagnoses:  Weakness  Dehydration     Discharge Instructions      Patient will proceed to the emergency room with his wife driving for further evaluation     ED Prescriptions   None    PDMP not reviewed this encounter.   Zenia Resides, MD 07/01/22 [phone]

## 2022-07-02 ENCOUNTER — Encounter: Payer: Self-pay | Admitting: Endocrinology

## 2022-07-04 ENCOUNTER — Encounter: Payer: Self-pay | Admitting: Family Medicine

## 2022-07-05 ENCOUNTER — Encounter: Payer: Self-pay | Admitting: Endocrinology

## 2022-07-08 ENCOUNTER — Encounter: Payer: Self-pay | Admitting: Family Medicine

## 2022-07-08 ENCOUNTER — Ambulatory Visit (INDEPENDENT_AMBULATORY_CARE_PROVIDER_SITE_OTHER): Payer: Medicare Other | Admitting: Family Medicine

## 2022-07-08 VITALS — BP 122/82 | HR 67 | Temp 98.6°F | Ht 69.0 in | Wt 175.6 lb

## 2022-07-08 DIAGNOSIS — I2510 Atherosclerotic heart disease of native coronary artery without angina pectoris: Secondary | ICD-10-CM | POA: Diagnosis not present

## 2022-07-08 DIAGNOSIS — Z9861 Coronary angioplasty status: Secondary | ICD-10-CM | POA: Diagnosis not present

## 2022-07-08 DIAGNOSIS — Z1211 Encounter for screening for malignant neoplasm of colon: Secondary | ICD-10-CM | POA: Diagnosis not present

## 2022-07-08 DIAGNOSIS — E785 Hyperlipidemia, unspecified: Secondary | ICD-10-CM | POA: Diagnosis not present

## 2022-07-08 DIAGNOSIS — I10 Essential (primary) hypertension: Secondary | ICD-10-CM

## 2022-07-08 DIAGNOSIS — I5022 Chronic systolic (congestive) heart failure: Secondary | ICD-10-CM | POA: Diagnosis not present

## 2022-07-08 DIAGNOSIS — E039 Hypothyroidism, unspecified: Secondary | ICD-10-CM

## 2022-07-08 DIAGNOSIS — J189 Pneumonia, unspecified organism: Secondary | ICD-10-CM

## 2022-07-08 DIAGNOSIS — Z0000 Encounter for general adult medical examination without abnormal findings: Secondary | ICD-10-CM

## 2022-07-08 DIAGNOSIS — E1051 Type 1 diabetes mellitus with diabetic peripheral angiopathy without gangrene: Secondary | ICD-10-CM

## 2022-07-08 NOTE — Progress Notes (Signed)
Phone: [phone]   Subjective:  Patient presents today for their annual physical. Chief complaint-noted.   See problem oriented charting- ROS- full  review of systems was completed and negative  except for: recent pneumonia - chills, sweating, fatigue, fever, cough, shortness of breath. Also ongoing issues with BPH- difficulty urinating, urinary frequency, tough getting urine stream going -discomfort going, joint pain   The following were reviewed and entered/updated in epic: Past Medical History:  Diagnosis Date   Acute kidney injury superimposed on chronic kidney disease (HCC) 01/25/2015   AICD (automatic cardioverter/defibrillator) present    CHF (congestive heart failure) (HCC)    CORONARY ARTERY DISEASE 08/11/2006   CVA 12/19/2006   2008   DIABETES MELLITUS, TYPE I 08/11/2006   ECZEMA, HANDS 12/15/2009   History of kidney stones    10+ years ago   HYPERLIPIDEMIA 08/11/2006   HYPERTENSION 08/11/2006   Hypothyroidism    Myocardial infarction [facility])    Presence of permanent cardiac pacemaker    Type 2 diabetes mellitus without complications (HCC) 12/20/2016   Patient Active Problem List   Diagnosis Date Noted   Type 1 diabetes mellitus with diabetic peripheral angiopathy without gangrene (HCC) 03/14/2020    Priority: High   Chronic systolic CHF (congestive heart failure) (HCC) 01/25/2015    Priority: High   Cardiomyopathy, ischemic 01/17/2015    Priority: High   History of CVA (cerebrovascular accident) 12/19/2006    Priority: High   CAD S/P LAD PCI'03, RCA DES 01/16/15 08/11/2006    Priority: High   Erectile dysfunction 05/14/2019    Priority: Medium    Left groin hernia 02/13/2018    Priority: Medium    Hypothyroidism 02/17/2015    Priority: Medium    Dyslipidemia 08/11/2006    Priority: Medium    Essential hypertension 08/11/2006    Priority: Medium    Allergic rhinitis 07/11/2017    Priority: Low   Epidermoid cyst of ear 10/22/2016    Priority: Low   LBBB  (left bundle branch block) 01/17/2015    Priority: Low   SVT (supraventricular tachycardia) 01/14/2015    Priority: Low   Former smoker 06/24/2014    Priority: Low   Eczema of both hands 12/15/2009    Priority: Low   Chronic systolic dysfunction of left ventricle 01/10/2016   Past Surgical History:  Procedure Laterality Date   CARDIAC CATHETERIZATION N/A 01/16/2015   Procedure: Left Heart Cath and Coronary Angiography;  Surgeon: [name], MD;  Location: [facility] INVASIVE CV LAB;  Service: Cardiovascular;  Laterality: N/A;   CORONARY STENT PLACEMENT     EP IMPLANTABLE DEVICE N/A 01/10/2016   MDT Claria MRI Quad CRTD implanted by Dr [name]   INGUINAL HERNIA REPAIR Left 05/22/2020   Procedure: LEFT INGUINAL HERNIA REPAIR WITH MESH;  Surgeon: Abigail Miyamoto, MD;  Location: [facility] OR;  Service: General;  Laterality: Left;   INSERT / REPLACE / REMOVE PACEMAKER     INSERTION OF MESH Left 05/22/2020   Procedure: INSERTION OF MESH;  Surgeon: Abigail Miyamoto, MD;  Location: [facility] OR;  Service: General;  Laterality: Left;   TEE WITHOUT CARDIOVERSION N/A 01/26/2015   Procedure: TRANSESOPHAGEAL ECHOCARDIOGRAM (TEE);  Surgeon: [name], MD;  Location: [facility] ENDOSCOPY;  Service: Cardiovascular;  Laterality: N/A;   TONSILECTOMY, ADENOIDECTOMY, BILATERAL MYRINGOTOMY AND TUBES     TONSILLECTOMY     WISDOM TOOTH EXTRACTION     WRIST FRACTURE SURGERY Right 1960   3rd grade    Family History  Problem Relation Age  of Onset   CAD Father    Other Mother        passed right before 75- tumor on kidney recent diagnosis   Dementia Mother        progressing rapidly   CAD Paternal Grandfather        72s   CAD Paternal Grandmother        44s   Brain cancer Maternal Grandmother    Lymphoma Sister        b cell   Celiac disease Sister    Diabetes Neg Hx     Medications- reviewed and updated Current Outpatient Medications  Medication Sig Dispense Refill   Blood Glucose Monitoring Suppl (ONETOUCH  VERIO) w/Device KIT 1 Device by Does not apply route daily. 1 kit 0   carvedilol (COREG) 6.25 MG tablet TAKE 1 TABLET(6.25 MG) BY MOUTH TWICE DAILY 180 tablet 3   CINNAMON PO Take 1 tablet by mouth daily.     clopidogrel (PLAVIX) 75 MG tablet TAKE 1 TABLET(75 MG) BY MOUTH DAILY 90 tablet 3   Coenzyme Q10 (COQ10) 200 MG CAPS Take 200 mg by mouth in the morning.     Continuous Blood Gluc Sensor (DEXCOM G7 SENSOR) MISC 1 Device by Does not apply route as directed. Change sensor every 10 days 3 each 3   doxycycline (VIBRAMYCIN) 100 MG capsule Take 1 capsule (100 mg total) by mouth 2 (two) times daily. 20 capsule 0   ENTRESTO 49-51 MG TAKE 1 TABLET BY MOUTH EVERY MORNING AND EVERY NIGHT AT BEDTIME 180 tablet 3   ezetimibe (ZETIA) 10 MG tablet TAKE 1 TABLET(10 MG) BY MOUTH DAILY 90 tablet 3   FARXIGA 10 MG TABS tablet Take 1 tablet (10 mg total) by mouth daily before breakfast. 30 tablet 2   GARLIC PO Take 1 tablet by mouth daily.     Glucagon (GVOKE HYPOPEN 2-PACK) 0.5 MG/0.1ML SOAJ Use for severe lows 0.2 mL 1   glucose blood (ONETOUCH VERIO) test strip 1 each by Other route 2 (two) times daily. And lancets 2/day 200 each 12   guaiFENesin-codeine (CHERATUSSIN AC) 100-10 MG/5ML syrup Take 5 mLs by mouth 4 (four) times daily as needed for cough. 120 mL 0   HUMALOG KWIKPEN 100 UNIT/ML KwikPen INJECT UNDER THE SKIN 7 UNITS EVERY MORNING, 3 UNITS AT LUNCH, AND 7 UNITS AT DINNER. 30 mL 0   Insulin Pen Needle (PEN NEEDLES) 32G X 4 MM MISC Use to inject insulin 4 times a day 100 each 2   levothyroxine (SYNTHROID) 125 MCG tablet Take 1 tablet (125 mcg total) by mouth daily. 90 tablet 3   ondansetron (ZOFRAN-ODT) 4 MG disintegrating tablet Take 1 tablet (4 mg total) by mouth every 8 (eight) hours as needed for nausea or vomiting. 10 tablet 0   oxyCODONE (ROXICODONE) 5 MG immediate release tablet Take 1 tablet (5 mg total) by mouth every 8 (eight) hours as needed for up to 10 days for breakthrough pain. 10 tablet  0   Probiotic Product (PROBIOTIC PO) Take 1 capsule by mouth every morning.     rosuvastatin (CRESTOR) 10 MG tablet TAKE 1 TABLET(10 MG) BY MOUTH DAILY 90 tablet 1   tadalafil (CIALIS) 5 MG tablet TAKE 1 TABLET(5 MG) BY MOUTH DAILY 90 tablet 3   tamsulosin (FLOMAX) 0.4 MG CAPS capsule Take 0.4 mg by mouth daily.     TOUJEO SOLOSTAR 300 UNIT/ML Solostar Pen ADMINISTER 38 UNITS UNDER THE SKIN EVERY DAY (Patient taking differently: Inject  26 Units into the skin daily at 6 (six) AM.) 6 mL 3   No current facility-administered medications for this visit.    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 3 step children, 1 biological. No grandkids.       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 Oropharynx normal Neck: no thyromegaly CV: RRR no murmurs rubs or gallops Lungs: CTAB no crackles, wheeze, rhonchi Abdomen: soft/nontender/nondistended/normal bowel sounds. No rebound or guarding.  Ext: trace edema Skin: warm, dry Neuro: grossly normal, moves all extremities, PERRLA   Assessment and Plan   72 y.o. male presenting for annual physical.  Health Maintenance counseling: 1. Anticipatory guidance: Patient counseled regarding regular dental exams -q6 months, eye exams -yearly,  avoiding smoking and second hand smoke , limiting alcohol to 2 beverages per day - well under, no illicit drugs .   2. Risk factor reduction:  Advised patient of need for regular exercise and diet rich and fruits and vegetables to reduce risk of heart attack and stroke.  Exercise- trying  to start day at [facility] [employer]- gets 4 days a week- every Thursday with trainer half hour.  Diet/weight management-weight stable from last physical in 2020- had been up but worked it back down [employer] from Last 3 Encounters:  07/08/22 175 lb 9.6 oz (79.7 kg)  07/01/22 172 lb (78 kg)  07/01/22 189 lb 9.5 oz (86 kg)  3. Immunizations/screenings/ancillary studies-reports recently recovered from pneumonia so we will hold off on COVID vaccination for now-recommended for COVID-vaccine to be considered once recovered- he wants to do before traveling to [place]-also due for Tetanus, Diphtheria, and Pertussis (Tdap)  Immunization History  Administered Date(s) Administered   Influenza Split 11/15/2010, 11/15/2011   Influenza Whole 10/23/2007, 11/04/2009, 11/04/2012   Influenza, High Dose Seasonal PF 11/08/2021   Influenza,inj,Quad PF,6+ Mos 10/28/2014   Influenza-Unspecified 10/05/2013, 10/16/2016, 11/19/2017, 12/03/2019, 11/07/2020   PFIZER Comirnaty(Gray Top)Covid-19 Tri-Sucrose Vaccine 10/27/2021   PFIZER(Purple Top)SARS-COV-2 Vaccination 03/13/2019, 04/07/2019, 10/30/2019, 10/11/2020   Pneumococcal Conjugate-13 06/24/2014   Pneumococcal Polysaccharide-23 07/07/2008, 01/03/2017   Tdap 11/15/2010   Zoster Recombinat (Shingrix) 02/20/2018, 04/28/2018   Zoster, Live 11/15/2011  4. Prostate cancer screening- have referred him to urology in the past for concerning PSA trend-ultimately diagnosed with BPH-PSAs are followed by urology  -considering PAE with radiology or water ablation with Dr. Alvester Morin Lab Results  Component Value Date   PSA 5.25 (H) 03/14/2020   PSA 5.19 (H) 07/11/2017   PSA 4.31 (H) 01/03/2017   5. Colon cancer screening - last Cologuard in2018- wanted to hold off last time as on dual blood thinners at that time- now off and willing to proceed- ordered today  6. Skin cancer screening-no dermatologist. advised regular sunscreen use. Denies  worrisome, changing, or new skin lesions.  7. Smoking associated screening (lung cancer screening, AAA screen 65-75, UA)-former smoker- 30 pack years but quit in 2002-no regular screening required other than AAA screening- opts out of AAA screen-  we will try to control blood pressure and avoid quinolones as best as possible anyway  8. STD screening - only active with wife  Status of chronic or acute concerns   #pneumonia - patient is recovering on doxyxycline- they suggested follow up x-ray which we will order today and he can complete in a month  # Diabetes type I-follows with endocrinology Dr. Lucianne Muss S: Medication: working on pump processing with medicare.  Toujeo and short acting CBGs- has had some lows unfortunately with travel. Got one sugar really low and now has Gvoke- first time that's happened Lab Results  Component Value Date   HGBA1C 7.0 (H) 05/13/2022   HGBA1C 6.9 (H) 02/11/2022   HGBA1C 7.0 (H) 10/01/2021  A/P: good control on a1c but has had some lows and working to get pump to try to avoid these   #Heart failure with reduced ejection fraction- follow up with Dr. [name] S: Medication:Carvedilol 6.25 mg twice daily, Farxiga 5 mg, Entresto 49-51 mg Edema: none Weight gain:none- actually losing Shortness of breath: other than pneumonia  A/P: euvolemic- continue current medications as doing well   # CAD -does have ICD and pacemaker in place  # History of SVT-No recurrence #hyperlipidemia S: Medication:Zetia 10 mg, rosuvastatin 10 mg, clopidogrel 75 mg Lab Results  Component Value Date   CHOL 157 05/01/2022   HDL 61 05/01/2022   LDLCALC 77 05/01/2022   LDLDIRECT 85.0 07/11/2017   TRIG 105 05/01/2022   CHOLHDL 2.6 05/01/2022  A/P: coronary artery disease asymptomatic continue current medications  Lipids hair above goal but max tolerable dose with myalgais continue current medications   # CKD stage III-GFR typically in the 50s to 60s range.  Avoids NSAIDs- opts out of  repeat today- likely slightly worse with pneumonia and hydration -opts out of repeat CBC as well- white count ws high and platlets slightly low  #hypothyroidism S: compliant On thyroid medication-levothyroxine 125 mcg Lab Results  Component Value Date   TSH 7.60 (H) 05/13/2022  A/P:hopefully improved- update tsh today. Continue current meds for now    # Erectile dysfunction-Cialis 5 mg daily helpful  -Also helps for BPH but major issues    Recommended follow up: Return in about 1 year (around 07/08/2023) for physical or sooner if needed.Schedule b4 you leave. Future Appointments  Date Time Provider Department Center  07/10/2022  7:00 AM [facility] DEVICE REMOTES [facility]  07/12/2022 10:00 AM [facility] IR [facility] [facility]  07/16/2022  8:15 AM Reather Littler, MD [facility] None  10/09/2022  7:00 AM [facility] DEVICE REMOTES [facility]  11/11/2022  8:00 AM Wendall Stade, MD [facility]   Lab/Order associations: fasting   ICD-10-CM   1. Preventative health care  Z00.00     2. CAD S/P LAD PCI'03, RCA DES 01/16/15  I25.10    Z98.61     3. Chronic systolic CHF (congestive heart failure) (HCC)  I50.22     4. Dyslipidemia  E78.5     5. Essential hypertension  I10     6. Hypothyroidism, unspecified type  E03.9 TSH    7. Screen for colon cancer  Z12.11 Cologuard    8. Type 1 diabetes mellitus with diabetic peripheral angiopathy without gangrene (HCC)  E10.51 Microalbumin / creatinine urine ratio      No orders of the defined types were placed in this encounter.   Return precautions advised.  Tana Conch, MD

## 2022-07-08 NOTE — Patient Instructions (Addendum)
Let us know when you get our TDAP and anymore COVID vaccines at your pharmacy (can do together)   please let us know if you have not received your Cologuard within 3 weeks-please complete after you receive  Please stop by lab before you go If you have mychart- we will send your results within 3 business days of [place] receiving them.  If you do not have mychart- we will call you about results within 5 business days of [place] receiving them.  *please also note that you will see labs on mychart as soon as they post. I will later go in and write notes on them- will say "notes from Dr. Durene Cal"   You are eligible to schedule your annual wellness visit with our nurse specialist Inetta Fermo.  Please consider scheduling this before you leave today  Please go to [facility]  IN 1 MONTH to recheck make sure pneumonia cleared - located [address] across the street from [place] - in the basement - Hours: 8:30-5:00 PM M-F (with lunch from 12:30- 1 PM). You do NOT need an appointment.    Recommended follow up: Return in about 1 year (around 07/08/2023) for physical or sooner if needed.Schedule b4 you leave.

## 2022-07-09 LAB — MICROALBUMIN / CREATININE URINE RATIO
Creatinine,U: 43 mg/dL
Microalb Creat Ratio: 1.6 mg/g (ref 0.0–30.0)
Microalb, Ur: <0.7 mg/dL (ref 0.0–1.9)

## 2022-07-09 LAB — TSH: TSH: 3.82 u[IU]/mL (ref 0.35–5.50)

## 2022-07-10 ENCOUNTER — Ambulatory Visit (INDEPENDENT_AMBULATORY_CARE_PROVIDER_SITE_OTHER): Payer: Medicare Other

## 2022-07-10 ENCOUNTER — Other Ambulatory Visit: Payer: Medicare Other

## 2022-07-10 DIAGNOSIS — I255 Ischemic cardiomyopathy: Secondary | ICD-10-CM

## 2022-07-10 LAB — CUP PACEART REMOTE DEVICE CHECK
Battery Remaining Longevity: 32 mo
Battery Voltage: 2.94 V
Brady Statistic AP VP Percent: 18.15 %
Brady Statistic AP VS Percent: 0.27 %
Brady Statistic AS VP Percent: 80.29 %
Brady Statistic AS VS Percent: 1.29 %
Brady Statistic RA Percent Paced: 18.3 %
Brady Statistic RV Percent Paced: 23.26 %
Date Time Interrogation Session: 20240605100106
HighPow Impedance: 60 Ohm
Implantable Lead Connection Status: 753985
Implantable Lead Connection Status: 753985
Implantable Lead Connection Status: 753985
Implantable Lead Implant Date: 20171206
Implantable Lead Implant Date: 20171206
Implantable Lead Implant Date: 20171206
Implantable Lead Location: 753858
Implantable Lead Location: 753859
Implantable Lead Location: 753860
Implantable Lead Model: 4598
Implantable Lead Model: 5076
Implantable Pulse Generator Implant Date: 20171206
Lead Channel Impedance Value: 180 Ohm
Lead Channel Impedance Value: 180 Ohm
Lead Channel Impedance Value: 180 Ohm
Lead Channel Impedance Value: 190 Ohm
Lead Channel Impedance Value: 190 Ohm
Lead Channel Impedance Value: 304 Ohm
Lead Channel Impedance Value: 342 Ohm
Lead Channel Impedance Value: 380 Ohm
Lead Channel Impedance Value: 380 Ohm
Lead Channel Impedance Value: 380 Ohm
Lead Channel Impedance Value: 380 Ohm
Lead Channel Impedance Value: 399 Ohm
Lead Channel Impedance Value: 399 Ohm
Lead Channel Impedance Value: 627 Ohm
Lead Channel Impedance Value: 627 Ohm
Lead Channel Impedance Value: 646 Ohm
Lead Channel Impedance Value: 646 Ohm
Lead Channel Impedance Value: 646 Ohm
Lead Channel Pacing Threshold Amplitude: 0.375 V
Lead Channel Pacing Threshold Amplitude: 1 V
Lead Channel Pacing Threshold Amplitude: 1.125 V
Lead Channel Pacing Threshold Pulse Width: 0.4 ms
Lead Channel Pacing Threshold Pulse Width: 0.4 ms
Lead Channel Pacing Threshold Pulse Width: 0.4 ms
Lead Channel Sensing Intrinsic Amplitude: 1.875 mV
Lead Channel Sensing Intrinsic Amplitude: 1.875 mV
Lead Channel Sensing Intrinsic Amplitude: 12.125 mV
Lead Channel Sensing Intrinsic Amplitude: 12.125 mV
Lead Channel Setting Pacing Amplitude: 1.75 V
Lead Channel Setting Pacing Amplitude: 2 V
Lead Channel Setting Pacing Amplitude: 2.5 V
Lead Channel Setting Pacing Pulse Width: 0.4 ms
Lead Channel Setting Pacing Pulse Width: 0.4 ms
Lead Channel Setting Sensing Sensitivity: 0.3 mV
Zone Setting Status: 755011
Zone Setting Status: 755011

## 2022-07-12 ENCOUNTER — Ambulatory Visit
Admission: RE | Admit: 2022-07-12 | Discharge: 2022-07-12 | Disposition: A | Discharge status: Home / Self Care | Payer: Medicare Other | Source: Ambulatory Visit | Attending: Urology | Admitting: Urology

## 2022-07-12 ENCOUNTER — Other Ambulatory Visit: Payer: Self-pay | Admitting: Interventional Radiology

## 2022-07-12 ENCOUNTER — Other Ambulatory Visit (HOSPITAL_COMMUNITY): Payer: Self-pay | Admitting: Interventional Radiology

## 2022-07-12 DIAGNOSIS — R3912 Poor urinary stream: Secondary | ICD-10-CM

## 2022-07-12 DIAGNOSIS — R351 Nocturia: Secondary | ICD-10-CM

## 2022-07-12 DIAGNOSIS — N401 Enlarged prostate with lower urinary tract symptoms: Secondary | ICD-10-CM

## 2022-07-12 DIAGNOSIS — N138 Other obstructive and reflux uropathy: Secondary | ICD-10-CM | POA: Diagnosis not present

## 2022-07-12 DIAGNOSIS — R3916 Straining to void: Secondary | ICD-10-CM

## 2022-07-12 DIAGNOSIS — R3914 Feeling of incomplete bladder emptying: Secondary | ICD-10-CM

## 2022-07-12 DIAGNOSIS — E1065 Type 1 diabetes mellitus with hyperglycemia: Secondary | ICD-10-CM | POA: Diagnosis not present

## 2022-07-12 NOTE — Consult Note (Signed)
Chief Complaint: Patient was seen in consultation today for Symptomatic benign prostatic hypertrophy at the request of Bell,Eugene D III  Referring Physician(s): Bell,Eugene D III  History of Present Illness: John Carrillo is a 72 y.o. male History of CAD post stenting complicated by CVA,, with progressive urinary outflow obstruction despite use of Flomax.  He describes progressively poor stream and pain with micturition, with incomplete emptying.  IPSS = 25 (severe). Current symptoms managed with tamsulosin with partial symptomatic relief .  He is wary of complications of alternative surgical treatments due to blood thinners,  and is motivated to proceed with PAE if a candidate.    Past Medical History:  Diagnosis Date   Acute kidney injury superimposed on chronic kidney disease (HCC) 01/25/2015   AICD (automatic cardioverter/defibrillator) present    CHF (congestive heart failure) (HCC)    CORONARY ARTERY DISEASE 08/11/2006   CVA 12/19/2006   2008   DIABETES MELLITUS, TYPE I 08/11/2006   ECZEMA, HANDS 12/15/2009   History of kidney stones    10+ years ago   HYPERLIPIDEMIA 08/11/2006   HYPERTENSION 08/11/2006   Hypothyroidism    Myocardial infarction [facility])    Presence of permanent cardiac pacemaker    Type 2 diabetes mellitus without complications (HCC) 12/20/2016    Past Surgical History:  Procedure Laterality Date   CARDIAC CATHETERIZATION N/A 01/16/2015   Procedure: Left Heart Cath and Coronary Angiography;  Surgeon: [name], MD;  Location[facility] INVASIVE CV LAB;  Service: Cardiovascular;  Laterality: N/A;   CORONARY STENT PLACEMENT     EP IMPLANTABLE DEVICE N/A 01/10/2016   MDT [place] MRI Quad CRTD implanted by Dr [name]   INGUINAL HERNIA REPAIR Left 05/22/2020   Procedure: LEFT INGUINAL HERNIA REPAIR WITH MESH;  Surgeon: Abigail Miyamoto, MD;  Location: [facility] OR;  Service: General;  Laterality: Left;   INSERT / REPLACE / REMOVE PACEMAKER     INSERTION OF MESH Left  05/22/2020   Procedure: INSERTION OF MESH;  Surgeon: Abigail Miyamoto, MD;  Location: [facility] OR;  Service: General;  Laterality: Left;   TEE WITHOUT CARDIOVERSION N/A 01/26/2015   Procedure: TRANSESOPHAGEAL ECHOCARDIOGRAM (TEE);  Surgeon: [name], MD;  Location: [facility] ENDOSCOPY;  Service: Cardiovascular;  Laterality: N/A;   TONSILECTOMY, ADENOIDECTOMY, BILATERAL MYRINGOTOMY AND TUBES     TONSILLECTOMY     WISDOM TOOTH EXTRACTION     WRIST FRACTURE SURGERY Right 1960   3rd grade    Allergies: Patient has no known allergies.  Medications: Prior to Admission medications   Medication Sig Start Date End Date Taking? Authorizing Provider  Blood Glucose Monitoring Suppl (ONETOUCH VERIO) w/Device KIT 1 Device by Does not apply route daily. 10/02/20   Romero Belling, MD  carvedilol (COREG) 6.25 MG tablet TAKE 1 TABLET(6.25 MG) BY MOUTH TWICE DAILY 01/07/22   Graciella Freer, PA-C  CINNAMON PO Take 1 tablet by mouth daily.    [provider]  clopidogrel (PLAVIX) 75 MG tablet TAKE 1 TABLET(75 MG) BY MOUTH DAILY 01/22/22   Allred, Fayrene Fearing, MD  Coenzyme Q10 (COQ10) 200 MG CAPS Take 200 mg by mouth in the morning.    [provider]  Continuous Blood Gluc Sensor (DEXCOM G7 SENSOR) MISC 1 Device by Does not apply route as directed. Change sensor every 10 days 07/17/21   Reather Littler, MD  doxycycline (VIBRAMYCIN) 100 MG capsule Take 1 capsule (100 mg total) by mouth 2 (two) times daily. 07/01/22   Virgina Norfolk, DO  ENTRESTO [phone]  MG TAKE 1 TABLET BY MOUTH EVERY MORNING AND EVERY NIGHT AT BEDTIME 12/04/21   Wendall Stade, MD  ezetimibe (ZETIA) 10 MG tablet TAKE 1 TABLET(10 MG) BY MOUTH DAILY 08/03/21   [name], MD  FARXIGA 10 MG TABS tablet Take 1 tablet (10 mg total) by mouth daily before breakfast. 04/02/22   Reather Littler, MD  GARLIC PO Take 1 tablet by mouth daily.    [provider]  Glucagon (GVOKE HYPOPEN 2-PACK) 0.5 MG/0.1ML SOAJ Use for severe lows 05/15/22    Reather Littler, MD  glucose blood (ONETOUCH VERIO) test strip 1 each by Other route 2 (two) times daily. And lancets 2/day 10/25/16   Romero Belling, MD  guaiFENesin-codeine [facility]) 100-10 MG/5ML syrup Take 5 mLs by mouth 4 (four) times daily as needed for cough. 06/28/22   Zenia Resides, MD  HUMALOG KWIKPEN 100 UNIT/ML KwikPen INJECT UNDER THE SKIN 7 UNITS EVERY MORNING, 3 UNITS AT LUNCH, AND 7 UNITS AT DINNER. 06/30/22   Reather Littler, MD  Insulin Pen Needle (PEN NEEDLES) 32G X 4 MM MISC Use to inject insulin 4 times a day 02/21/22   Reather Littler, MD  levothyroxine (SYNTHROID) 125 MCG tablet Take 1 tablet (125 mcg total) by mouth daily. 05/15/22   Reather Littler, MD  ondansetron (ZOFRAN-ODT) 4 MG disintegrating tablet Take 1 tablet (4 mg total) by mouth every 8 (eight) hours as needed for nausea or vomiting. 06/28/22   Zenia Resides, MD  Probiotic Product (PROBIOTIC PO) Take 1 capsule by mouth every morning.    [provider]  rosuvastatin (CRESTOR) 10 MG tablet TAKE 1 TABLET(10 MG) BY MOUTH DAILY 03/18/22   Wendall Stade, MD  tadalafil (CIALIS) 5 MG tablet TAKE 1 TABLET(5 MG) BY MOUTH DAILY 04/29/22   [name], MD  tamsulosin (FLOMAX) 0.4 MG CAPS capsule Take 0.4 mg by mouth daily. 04/25/22   [provider]  TOUJEO SOLOSTAR 300 UNIT/ML Solostar Pen ADMINISTER 38 UNITS UNDER THE SKIN EVERY DAY Patient taking differently: Inject 26 Units into the skin daily at 6 (six) AM. 02/21/22   Reather Littler, MD     Family History  Problem Relation Age of Onset   CAD Father    Other Mother        passed right before 54- tumor on kidney recent diagnosis   Dementia Mother        progressing rapidly   CAD Paternal Grandfather        68s   CAD Paternal Grandmother        67s   Brain cancer Maternal Grandmother    Lymphoma Sister        b cell   Celiac disease Sister    Diabetes Neg Hx      Social History   Socioeconomic History   Marital status: Married    Spouse  name: Not on file   Number of children: Not on file   Years of education: Not on file   Highest education level: Master's degree (e.g., MA, MS, MEng, MEd, MSW, MBA)  Occupational History   Occupation: Engineer, maintenance: [employer]  Tobacco Use   Smoking status: Former    Packs/day: 1.00    Years: 30.00    Additional pack years: 0.00    Total pack years: 30.00    Types: Cigarettes    Quit date: 05/03/2000    Years since quitting: 22.2   Smokeless tobacco: Never  Vaping Use   Vaping Use: Never used  Substance and Sexual Activity   Alcohol use: Yes    Alcohol/week: 3.0 standard drinks of alcohol    Types: 1 Glasses of wine, 1 Cans of beer, 1 Shots of liquor per week    Comment: 1-2/month   Drug use: No   Sexual activity: Not on file  Other Topics Concern   Not on file  Social History Narrative   Married (wife seen elsewhere- still working in Catering manager 2024). 3 step children, 1 biological. No grandkids.       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      Hobbies: race sail boats, adrenaline related activities   Social Determinants of Health   Financial Resource Strain: Low Risk  (07/04/2022)   Overall Financial Resource Strain (CARDIA)    Difficulty of Paying Living Expenses: Not hard at all  Food Insecurity: No Food Insecurity (07/04/2022)   Hunger Vital Sign    Worried About Running Out of Food in the Last Year: Never true    Ran Out of Food in the Last Year: Never true  Transportation Needs: No Transportation Needs (07/04/2022)   PRAPARE - Administrator, Civil Service (Medical): No    Lack of Transportation (Non-Medical): No  Physical Activity: Sufficiently Active (07/04/2022)   Exercise Vital Sign    Days of Exercise per Week: 4 days    Minutes of Exercise per Session: 60 min  Stress: No Stress Concern Present (07/04/2022)   [employer] of Occupational Health - Occupational Stress Questionnaire     Feeling of Stress : Not at all  Social Connections: Moderately Isolated (07/04/2022)   Social Connection and Isolation Panel [NHANES]    Frequency of Communication with Friends and Family: Once a week    Frequency of Social Gatherings with Friends and Family: Once a week    Attends Religious Services: Never    Database administrator or Organizations: Yes    Attends Engineer, structural: 1 to 4 times per year    Marital Status: Married  Originally from [place], [place];  resident of the [place] area since the 80s  ECOG Status: 1 - Symptomatic but completely ambulatory  Review of Systems: A 12 point ROS discussed and pertinent positives are indicated in the HPI above.  All other systems are negative.  Review of Systems  Vital Signs: BP 112/76   Pulse 61   Wt 77.1 kg   SpO2 95% Comment: room air  BMI 25.10 kg/m     Physical Exam Constitutional: Oriented to person, place, and time. Well-developed and well-nourished. No distress.  Last Weight  Most recent update: 07/12/2022 10:01 AM    Weight  77.1 kg (170 lb)            HENT:  Head: Normocephalic and atraumatic.  Eyes: Conjunctivae and EOM are normal. Right eye exhibits no discharge. Left eye exhibits no discharge. No scleral icterus.  Neck: No JVD present.  Pulmonary/Chest: Effort normal. No stridor. No respiratory distress.  Abdomen:   non distended Neurological:  alert and oriented to person, place, and time.  Skin: Skin is warm and dry.  not diaphoretic.  Psychiatric:   normal mood and affect.   behavior is normal. Judgment and thought content normal.         Imaging: CUP PACEART REMOTE DEVICE CHECK  Result Date: 07/10/2022 Scheduled remote reviewed. Normal device function.  There were 3 SVT arrhythmias detected,  the longest was one minute and 47 seconds.  ? AVNRT vs VT, sent to triage Next remote 91 days. Kelita Wallis Halim, RN, CCDS, [employer]  DG Chest Portable 1 View  Result Date:  07/01/2022 CLINICAL DATA:  Cough. EXAM: PORTABLE CHEST 1 VIEW COMPARISON:  September 02, 2021 FINDINGS: Normal cardiac silhouette. Tortuosity and calcific atherosclerotic disease of the aorta. Biventricular pacemaker is unchanged in position. Dense airspace consolidation in the right upper lobe. IMPRESSION: Dense airspace consolidation in the right upper lobe. This may represent unifocal pneumonia, however post obstructive atelectasis or consolidation cannot be excluded. Please follow-up to solution. If resolution could not be documented radiographically, CT of the chest is recommended. Electronically Signed   By: Ted Mcalpine M.D.   On: 07/01/2022 15:47    Labs:  CBC: Recent Labs    09/02/21 0258 09/02/21 0409 07/01/22 1550  WBC 10.7*  --  12.3*  HGB 15.6 15.6 14.2  HCT 46.0 46.0 42.2  PLT 162  --  142*    COAGS: Recent Labs    09/02/21 0258  INR 0.9    BMP: Recent Labs    09/02/21 0258 09/02/21 0409 02/11/22 0810 05/13/22 0828 06/18/22 0857 07/01/22 1550  NA 140   < > 141 140 144 133*  K 3.7   < > 4.3 4.2 4.5 4.1  CL 109   < > 105 105 108 98  CO2 24   < > 31 26 28 24   GLUCOSE 118*   < > 83 153* 70  70 192*  BUN 16   < > 17 20 18  27*  CALCIUM 8.8*   < > 9.4 9.1 9.5 8.3*  CREATININE 1.29*   < > 1.27 1.32 1.28 1.47*  GFRNONAA 60*  --   --   --   --  51*   < > = values in this interval not displayed.    LIVER FUNCTION TESTS: Recent Labs    07/13/21 0810 10/26/21 0903 05/01/22 0733 07/01/22 1550  BILITOT 0.5 0.4 0.5 0.8  AST 34 26 21 36  ALT 42 30 22 25   ALKPHOS 48 54 65 52  PROT 6.7 6.2 6.5 6.6  ALBUMIN 3.9 4.0 4.1 3.0*    TUMOR MARKERS: No results for input(s): "AFPTM", "CEA", "CA199", "CHROMGRNA" in the last 8760 hours.  Assessment and Plan:  72 year old male with a history of benign prostatic hyperplasia with obstruction and lower urinary tract symptoms, lifestyle limiting.  We discussed BPH, natural history, surgical and nonsurgical treatment  options, details of PAE including risks and complications. He seemed to understand and did ask appropriate questions which were answered. He is an excellent candidate for PAE,  He is agreeable and wishes to proceed with PAE.   Given history of CVA post transradial cardiac procedures, we can use femoral approach to eliminate that risk.  Thank you for this interesting consult.  I greatly enjoyed meeting SASHANK ROUSSELLE and look forward to participating in their care.  A copy of this report was sent to the requesting provider on this date.  Electronically Signed: Durwin Glaze 07/12/2022, 11:40 AM   I spent a total of   40 Minutes   in  clinical consultation, greater than 50% of which was counseling/coordinating care for BPH.

## 2022-07-16 ENCOUNTER — Ambulatory Visit: Payer: Medicare Other | Admitting: Endocrinology

## 2022-07-16 ENCOUNTER — Ambulatory Visit
Admission: RE | Admit: 2022-07-16 | Discharge: 2022-07-16 | Disposition: A | Discharge status: Home / Self Care | Payer: Medicare Other | Source: Ambulatory Visit | Attending: Interventional Radiology | Admitting: Interventional Radiology

## 2022-07-16 DIAGNOSIS — R3914 Feeling of incomplete bladder emptying: Secondary | ICD-10-CM

## 2022-07-16 DIAGNOSIS — R3912 Poor urinary stream: Secondary | ICD-10-CM

## 2022-07-16 DIAGNOSIS — N138 Other obstructive and reflux uropathy: Secondary | ICD-10-CM

## 2022-07-16 DIAGNOSIS — R351 Nocturia: Secondary | ICD-10-CM

## 2022-07-16 DIAGNOSIS — R3916 Straining to void: Secondary | ICD-10-CM

## 2022-07-16 MED ORDER — IOPAMIDOL (ISOVUE-370) INJECTION 76%
100.0000 mL | Freq: Once | INTRAVENOUS | Status: AC | PRN
  Administered 2022-07-16: 100 mL via INTRAVENOUS

## 2022-07-17 ENCOUNTER — Encounter: Payer: Self-pay | Admitting: Family Medicine

## 2022-07-19 ENCOUNTER — Telehealth: Payer: Self-pay | Admitting: Dietician

## 2022-07-19 DIAGNOSIS — Z1211 Encounter for screening for malignant neoplasm of colon: Secondary | ICD-10-CM | POA: Diagnosis not present

## 2022-07-19 NOTE — Telephone Encounter (Signed)
Returned patient call. He now has the Mobi pump and states that he is ready for training. Patient of Dr. Lucianne Muss. Left message for Bonita Quin to arrange this appointment.  Oran Rein, RD, LDN, CDCES

## 2022-07-22 ENCOUNTER — Ambulatory Visit (INDEPENDENT_AMBULATORY_CARE_PROVIDER_SITE_OTHER): Payer: Medicare Other

## 2022-07-22 VITALS — Wt 170.0 lb

## 2022-07-22 DIAGNOSIS — Z0000 Encounter for general adult medical examination without abnormal findings: Secondary | ICD-10-CM | POA: Diagnosis not present

## 2022-07-22 NOTE — Progress Notes (Signed)
I connected with  John Carrillo on 07/22/22 by a audio enabled telemedicine application and verified that I am speaking with the correct person using two identifiers.  Patient Location: Home  Provider Location: Office/Clinic  I discussed the limitations of evaluation and management by telemedicine. The patient expressed understanding and agreed to proceed.   Patient Medicare AWV questionnaire was completed by the patient on 07/18/22; I have confirmed that all information answered by patient is correct and no changes since this date.      Subjective:   John Carrillo is a 72 y.o. male who presents for an Initial Medicare Annual Wellness Visit.  Review of Systems     Cardiac Risk Factors include: advanced age (>72men, >67 women);hypertension;dyslipidemia;diabetes mellitus;male gender     Objective:    Today's Vitals   07/22/22 1337  Weight: 170 lb (77.1 kg)   Body mass index is 25.1 kg/m.     07/22/2022    1:42 PM 07/01/2022    3:09 PM 09/02/2021    2:46 AM 05/22/2020   11:29 AM 05/18/2020    9:10 AM 01/10/2016    8:13 AM 01/26/2015    8:27 AM  Advanced Directives  Does Patient Have a Medical Advance Directive? Yes Yes No  Yes No Yes  Type of Estate agent of [place];Living will Healthcare Power of [place];Living will   Healthcare Power of [employer] of [place];Living will  Does patient want to make changes to medical advance directive?  No - Patient declined  No - Patient declined No - Patient declined    Copy of Healthcare Power of Attorney in Chart? No - copy requested No - copy requested   No - copy requested  No - copy requested  Would patient like information on creating a medical advance directive?      No - Patient declined     Current Medications (verified) Outpatient Encounter Medications as of 07/22/2022  Medication Sig   Blood Glucose Monitoring Suppl (ONETOUCH VERIO) w/Device KIT 1 Device by Does not apply route daily.    carvedilol (COREG) 6.25 MG tablet TAKE 1 TABLET(6.25 MG) BY MOUTH TWICE DAILY   CINNAMON PO Take 1 tablet by mouth daily.   clopidogrel (PLAVIX) 75 MG tablet TAKE 1 TABLET(75 MG) BY MOUTH DAILY   Coenzyme Q10 (COQ10) 200 MG CAPS Take 200 mg by mouth in the morning.   Continuous Blood Gluc Sensor (DEXCOM G7 SENSOR) MISC 1 Device by Does not apply route as directed. Change sensor every 10 days   ENTRESTO 49-51 MG TAKE 1 TABLET BY MOUTH EVERY MORNING AND EVERY NIGHT AT BEDTIME   ezetimibe (ZETIA) 10 MG tablet TAKE 1 TABLET(10 MG) BY MOUTH DAILY   FARXIGA 10 MG TABS tablet Take 1 tablet (10 mg total) by mouth daily before breakfast.   GARLIC PO Take 1 tablet by mouth daily.   Glucagon (GVOKE HYPOPEN 2-PACK) 0.5 MG/0.1ML SOAJ Use for severe lows   glucose blood (ONETOUCH VERIO) test strip 1 each by Other route 2 (two) times daily. And lancets 2/day   HUMALOG KWIKPEN 100 UNIT/ML KwikPen INJECT UNDER THE SKIN 7 UNITS EVERY MORNING, 3 UNITS AT LUNCH, AND 7 UNITS AT DINNER.   Insulin Pen Needle (PEN NEEDLES) 32G X 4 MM MISC Use to inject insulin 4 times a day   levothyroxine (SYNTHROID) 125 MCG tablet Take 1 tablet (125 mcg total) by mouth daily.   Probiotic Product (PROBIOTIC PO) Take 1 capsule by mouth every  morning.   rosuvastatin (CRESTOR) 10 MG tablet TAKE 1 TABLET(10 MG) BY MOUTH DAILY   tadalafil (CIALIS) 5 MG tablet TAKE 1 TABLET(5 MG) BY MOUTH DAILY   tamsulosin (FLOMAX) 0.4 MG CAPS capsule Take 0.4 mg by mouth daily.   TOUJEO SOLOSTAR 300 UNIT/ML Solostar Pen ADMINISTER 38 UNITS UNDER THE SKIN EVERY DAY (Patient taking differently: Inject 26 Units into the skin daily at 6 (six) AM.)   [DISCONTINUED] doxycycline (VIBRAMYCIN) 100 MG capsule Take 1 capsule (100 mg total) by mouth 2 (two) times daily.   [DISCONTINUED] guaiFENesin-codeine (CHERATUSSIN AC) 100-10 MG/5ML syrup Take 5 mLs by mouth 4 (four) times daily as needed for cough.   [DISCONTINUED] ondansetron (ZOFRAN-ODT) 4 MG disintegrating  tablet Take 1 tablet (4 mg total) by mouth every 8 (eight) hours as needed for nausea or vomiting.   No facility-administered encounter medications on file as of 07/22/2022.    Allergies (verified) Patient has no known allergies.   History: Past Medical History:  Diagnosis Date   Acute kidney injury superimposed on chronic kidney disease (HCC) 01/25/2015   AICD (automatic cardioverter/defibrillator) present    CHF (congestive heart failure) (HCC)    CORONARY ARTERY DISEASE 08/11/2006   CVA 12/19/2006   2008   DIABETES MELLITUS, TYPE I 08/11/2006   ECZEMA, HANDS 12/15/2009   History of kidney stones    10+ years ago   HYPERLIPIDEMIA 08/11/2006   HYPERTENSION 08/11/2006   Hypothyroidism    Myocardial infarction [facility])    Presence of permanent cardiac pacemaker    Type 2 diabetes mellitus without complications (HCC) 12/20/2016   Past Surgical History:  Procedure Laterality Date   CARDIAC CATHETERIZATION N/A 01/16/2015   Procedure: Left Heart Cath and Coronary Angiography;  Surgeon: [name], MD;  Location: [facility] INVASIVE CV LAB;  Service: Cardiovascular;  Laterality: N/A;   CORONARY STENT PLACEMENT     EP IMPLANTABLE DEVICE N/A 01/10/2016   MDT [place] MRI Quad CRTD implanted by Dr [name]   INGUINAL HERNIA REPAIR Left 05/22/2020   Procedure: LEFT INGUINAL HERNIA REPAIR WITH MESH;  Surgeon: Abigail Miyamoto, MD;  Location: [facility] OR;  Service: General;  Laterality: Left;   INSERT / REPLACE / REMOVE PACEMAKER     INSERTION OF MESH Left 05/22/2020   Procedure: INSERTION OF MESH;  Surgeon: Abigail Miyamoto, MD;  Location: [facility] OR;  Service: General;  Laterality: Left;   TEE WITHOUT CARDIOVERSION N/A 01/26/2015   Procedure: TRANSESOPHAGEAL ECHOCARDIOGRAM (TEE);  Surgeon: [name], MD;  Location: [facility] ENDOSCOPY;  Service: Cardiovascular;  Laterality: N/A;   TONSILECTOMY, ADENOIDECTOMY, BILATERAL MYRINGOTOMY AND TUBES     TONSILLECTOMY     WISDOM TOOTH EXTRACTION     WRIST FRACTURE SURGERY  Right 1960   3rd grade   Family History  Problem Relation Age of Onset   CAD Father    Other Mother        passed right before 60- tumor on kidney recent diagnosis   Dementia Mother        progressing rapidly   CAD Paternal Grandfather        27s   CAD Paternal Grandmother        10s   Brain cancer Maternal Grandmother    Lymphoma Sister        b cell   Celiac disease Sister    Diabetes Neg Hx    Social History   Socioeconomic History   Marital status: Married    Spouse name: Not on file  Number of children: Not on file   Years of education: Not on file   Highest education level: Master's degree (e.g., MA, MS, MEng, MEd, MSW, MBA)  Occupational History   Occupation: Engineer, maintenance: [employer]  Tobacco Use   Smoking status: Former    Packs/day: 1.00    Years: 30.00    Additional pack years: 0.00    Total pack years: 30.00    Types: Cigarettes    Quit date: 05/03/2000    Years since quitting: 22.2   Smokeless tobacco: Never  Vaping Use   Vaping Use: Never used  Substance and Sexual Activity   Alcohol use: Yes    Alcohol/week: 3.0 standard drinks of alcohol    Types: 1 Glasses of wine, 1 Cans of beer, 1 Shots of liquor per week    Comment: 1-2/month   Drug use: No   Sexual activity: Not on file  Other Topics Concern   Not on file  Social History Narrative   Married (wife seen elsewhere- still working in Catering manager 2024). 3 step children, 1 biological. No grandkids.       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      Hobbies: race sail boats, adrenaline related activities   Social Determinants of Health   Financial Resource Strain: Low Risk  (07/18/2022)   Overall Financial Resource Strain (CARDIA)    Difficulty of Paying Living Expenses: Not hard at all  Food Insecurity: No Food Insecurity (07/18/2022)   Hunger Vital Sign    Worried About Running Out of Food in the Last Year: Never true    Ran Out  of Food in the Last Year: Never true  Transportation Needs: No Transportation Needs (07/18/2022)   PRAPARE - Administrator, Civil Service (Medical): No    Lack of Transportation (Non-Medical): No  Physical Activity: Sufficiently Active (07/18/2022)   Exercise Vital Sign    Days of Exercise per Week: 5 days    Minutes of Exercise per Session: 60 min  Stress: No Stress Concern Present (07/18/2022)   [employer] of Occupational Health - Occupational Stress Questionnaire    Feeling of Stress : Not at all  Social Connections: Moderately Integrated (07/18/2022)   Social Connection and Isolation Panel [NHANES]    Frequency of Communication with Friends and Family: More than three times a week    Frequency of Social Gatherings with Friends and Family: Twice a week    Attends Religious Services: Never    Database administrator or Organizations: Yes    Attends Engineer, structural: More than 4 times per year    Marital Status: Married  Recent Concern: Social Connections - Moderately Isolated (07/04/2022)   Social Connection and Isolation Panel [NHANES]    Frequency of Communication with Friends and Family: Once a week    Frequency of Social Gatherings with Friends and Family: Once a week    Attends Religious Services: Never    Database administrator or Organizations: Yes    Attends Engineer, structural: 1 to 4 times per year    Marital Status: Married    Tobacco Counseling Counseling given: Not Answered   Clinical Intake:  Pre-visit preparation completed: Yes  Pain : No/denies pain     BMI - recorded: 25.1 Nutritional Status: BMI 25 -29 Overweight Nutritional Risks: None Diabetes: Yes CBG done?: Yes (230 per pt) CBG resulted in Enter/ Edit results?: No Did pt.  bring in CBG monitor from home?: No  How often do you need to have someone help you when you read instructions, pamphlets, or other written materials from your doctor or pharmacy?: (P) 1 -  Never  Diabetic?Nutrition Risk Assessment:  Has the patient had any N/V/D within the last 2 months?  No  Does the patient have any non-healing wounds?  No  Has the patient had any unintentional weight loss or weight gain?  No   Diabetes:  Is the patient diabetic?  Yes  If diabetic, was a CBG obtained today?  Yes  Did the patient bring in their glucometer from home?  No  How often do you monitor your CBG's? daily.   Financial Strains and Diabetes Management:  Are you having any financial strains with the device, your supplies or your medication? No .  Does the patient want to be seen by Chronic Care Management for management of their diabetes?  No  Would the patient like to be referred to a Nutritionist or for Diabetic Management?  No   Diabetic Exams:  Diabetic Eye Exam: Completed 01/23/22 Diabetic Foot Exam: Completed 02/13/22   Interpreter Needed?: No  Information entered by :: Lanier Ensign, LPN   Activities of Daily Living    07/18/2022    2:58 PM  In your present state of health, do you have any difficulty performing the following activities:  Hearing? 0  Vision? 0  Difficulty concentrating or making decisions? 0  Walking or climbing stairs? 0  Dressing or bathing? 0  Doing errands, shopping? 0  Preparing Food and eating ? N  Using the Toilet? N  In the past six months, have you accidently leaked urine? N  Do you have problems with loss of bowel control? N  Managing your Medications? N  Managing your Finances? N  Housekeeping or managing your Housekeeping? N    Patient Care Team: [name], MD as PCP - General (Family Medicine) Hillis Range, MD (Inactive) as PCP - Electrophysiology (Cardiology) Wendall Stade, MD as PCP - Cardiology (Cardiology)  Indicate any recent Medical Services you may have received from other than [facility] providers in the past year (date may be approximate).     Assessment:   This is a routine wellness examination for  John [name]  Hearing/Vision screen Hearing Screening - Comments:: Pt has hearing aids  Vision Screening - Comments:: Pt follows up Dr Fredrich Birks for annual eye exams   Dietary issues and exercise activities discussed: Current Exercise Habits: Home exercise routine, Type of exercise: walking;stretching;strength training/weights, Time (Minutes): 60, Frequency (Times/Week): 6, Weekly Exercise (Minutes/Week): 360   Goals Addressed             This Visit's Progress    Patient Stated       Stay healthy        Depression Screen    07/22/2022    1:42 PM 07/08/2022    2:47 PM 03/14/2020    8:50 AM 05/14/2019    9:53 AM 02/13/2018    8:14 AM 01/03/2017    8:50 AM 09/12/2016    9:53 AM  PHQ 2/9 Scores  PHQ - 2 Score 0 0 0 0 0 0 0  PHQ- 9 Score 0 0         Fall Risk    07/18/2022    2:58 PM 07/08/2022    2:43 PM 05/14/2019    9:53 AM 01/03/2017    8:50 AM 09/12/2016    9:53 AM  Fall Risk   Falls in the past year? 0 0 0 No No  Number falls in past yr: 0 0 0    Injury with Fall? 0 0 0    Risk for fall due to : Impaired vision No Fall Risks     Follow up Falls prevention discussed Falls evaluation completed       FALL RISK PREVENTION PERTAINING TO THE HOME:  Any stairs in or around the home? Yes  If so, are there any without handrails? No  Home free of loose throw rugs in walkways, pet beds, electrical cords, etc? Yes  Adequate lighting in your home to reduce risk of falls? Yes   ASSISTIVE DEVICES UTILIZED TO PREVENT FALLS:  Life alert? No  Use of a cane, walker or w/c? No  Grab bars in the bathroom? Yes  Shower chair or bench in shower? Yes  Elevated toilet seat or a handicapped toilet? No   TIMED UP AND GO:  Was the test performed? No .   Cognitive Function:        07/22/2022    1:45 PM  6CIT Screen  What Year? 0 points  What month? 0 points  What time? 0 points  Count back from 20 0 points  Months in reverse 0 points  Repeat phrase 0 points  Total Score 0 points     Immunizations Immunization History  Administered Date(s) Administered   Influenza Split 11/15/2010, 11/15/2011   Influenza Whole 10/23/2007, 11/04/2009, 11/04/2012   Influenza, High Dose Seasonal PF 11/08/2021   Influenza,inj,Quad PF,6+ Mos 10/28/2014   Influenza-Unspecified 10/05/2013, 10/16/2016, 11/19/2017, 12/03/2019, 11/07/2020   PFIZER Comirnaty(Gray Top)Covid-19 Tri-Sucrose Vaccine 10/27/2021, 07/15/2022   PFIZER(Purple Top)SARS-COV-2 Vaccination 03/13/2019, 04/07/2019, 10/30/2019, 10/11/2020   Pneumococcal Conjugate-13 06/24/2014   Pneumococcal Polysaccharide-23 07/07/2008, 01/03/2017   Tdap 11/15/2010, 07/15/2022   Zoster Recombinat (Shingrix) 02/20/2018, 04/28/2018   Zoster, Live 11/15/2011    TDAP status: Up to date  Flu Vaccine status: Up to date  Pneumococcal vaccine status: Up to date  Covid-19 vaccine status: Completed vaccines  Qualifies for Shingles Vaccine? Yes   Zostavax completed Yes   Shingrix Completed?: Yes  Screening Tests Health Maintenance  Topic Date Due   Fecal DNA (Cologuard)  01/21/2020   INFLUENZA VACCINE  09/05/2022   COVID-19 Vaccine (7 - 2023-24 season) 09/09/2022   HEMOGLOBIN A1C  11/12/2022   OPHTHALMOLOGY EXAM  01/24/2023   FOOT EXAM  02/14/2023   Diabetic kidney evaluation - eGFR measurement  07/01/2023   Diabetic kidney evaluation - Urine ACR  07/08/2023   Medicare Annual Wellness (AWV)  07/22/2023   Pneumonia Vaccine 71+ Years old  Completed   Hepatitis C Screening  Completed   Zoster Vaccines- Shingrix  Completed   HPV VACCINES  Aged Out   DTaP/Tdap/Td  Discontinued    Health Maintenance  Health Maintenance Due  Topic Date Due   Fecal DNA (Cologuard)  01/21/2020    Colorectal cancer screening: Type of screening: Cologuard. Completed 01/20/17. Repeat every 3 years   Additional Screening:  Hepatitis C Screening:  Completed 12/23/14  Vision Screening: Recommended annual ophthalmology exams for early detection of  glaucoma and other disorders of the eye. Is the patient up to date with their annual eye exam?  Yes  Who is the provider or what is the name of the office in which the patient attends annual eye exams? Dr Fredrich Birks If pt is not established with a provider, would they like to be referred to a provider  to establish care? No .   Dental Screening: Recommended annual dental exams for proper oral hygiene  Community Resource Referral / Chronic Care Management: CRR required this visit?  No   CCM required this visit?  No      Plan:     I have personally reviewed and noted the following in the patient's chart:   Medical and social history Use of alcohol, tobacco or illicit drugs  Current medications and supplements including opioid prescriptions. Patient is not currently taking opioid prescriptions. Functional ability and status Nutritional status Physical activity Advanced directives List of other physicians Hospitalizations, surgeries, and ER visits in previous 12 months Vitals Screenings to include cognitive, depression, and falls Referrals and appointments  In addition, I have reviewed and discussed with patient certain preventive protocols, quality metrics, and best practice recommendations. A written personalized care plan for preventive services as well as general preventive health recommendations were provided to patient.     Marzella Schlein, LPN   05/13/8117   Nurse Notes: none

## 2022-07-25 DIAGNOSIS — R3916 Straining to void: Secondary | ICD-10-CM | POA: Diagnosis not present

## 2022-07-25 DIAGNOSIS — R3914 Feeling of incomplete bladder emptying: Secondary | ICD-10-CM | POA: Diagnosis not present

## 2022-07-25 LAB — COLOGUARD: COLOGUARD: NEGATIVE

## 2022-07-29 ENCOUNTER — Ambulatory Visit (HOSPITAL_COMMUNITY): Payer: Medicare Other

## 2022-07-30 ENCOUNTER — Encounter: Payer: Self-pay | Admitting: Family Medicine

## 2022-07-30 ENCOUNTER — Other Ambulatory Visit: Payer: Self-pay

## 2022-07-30 MED ORDER — EZETIMIBE 10 MG PO TABS: ORAL_TABLET | ORAL | 3 refills | Status: DC

## 2022-08-05 ENCOUNTER — Telehealth: Payer: Self-pay | Admitting: Nutrition

## 2022-08-05 NOTE — Telephone Encounter (Unsigned)
Pt. Was contacted and appointment was scheduled by patient with another trainer

## 2022-08-05 NOTE — Telephone Encounter (Signed)
Called to schedule pump training, but patient has an appointment with a trainer on the the 21st.

## 2022-08-05 NOTE — Progress Notes (Signed)
Remote ICD transmission.   

## 2022-08-07 ENCOUNTER — Telehealth: Payer: Self-pay | Admitting: Nutrition

## 2022-08-07 NOTE — Telephone Encounter (Signed)
Per phone message yesterday that patient has a trainer for this pump, I called him back saying that I need the name of the trainer to send the pump start orders to, as well as to document for his record here.  Telephone number given to call me back

## 2022-08-13 ENCOUNTER — Other Ambulatory Visit: Payer: Self-pay | Admitting: Endocrinology

## 2022-08-21 ENCOUNTER — Other Ambulatory Visit: Payer: Self-pay | Admitting: Endocrinology

## 2022-08-27 ENCOUNTER — Other Ambulatory Visit: Payer: Self-pay | Admitting: Radiology

## 2022-08-27 ENCOUNTER — Other Ambulatory Visit: Payer: Self-pay | Admitting: Endocrinology

## 2022-08-27 DIAGNOSIS — N401 Enlarged prostate with lower urinary tract symptoms: Secondary | ICD-10-CM

## 2022-08-27 DIAGNOSIS — E1165 Type 2 diabetes mellitus with hyperglycemia: Secondary | ICD-10-CM

## 2022-08-27 NOTE — H&P (Signed)
Referring Physician(s): Bell,E  Supervising Physician: Oley Balm  Patient Status:  [facility]  Chief Complaint:  Symptomatic benign prostatic hyperplasia  Subjective: Patient known to IR team from consultation with Dr. Deanne Coffer on 07/12/2022 to discuss treatment options for symptomatic BPH.  He is a 72 year old male, ex-smoker, with past medical history significant for chronic kidney disease, AICD/pacemaker, CHF, coronary artery disease with prior MI/stenting, prior CVA 2008, diabetes, eczema, hyperlipidemia, hypertension, hypothyroidism, and diabetes.  He also has BPH with progressive urinary outflow obstruction despite use of Flomax.  He reports progressively poor stream and pain with micturition with incomplete emptying.  Following discussion with Dr. Deanne Coffer patient was deemed an appropriate candidate for prostate artery embolization and presents today for the procedure.   Past Medical History:  Diagnosis Date   Acute kidney injury superimposed on chronic kidney disease (HCC) 01/25/2015   AICD (automatic cardioverter/defibrillator) present    CHF (congestive heart failure) (HCC)    CORONARY ARTERY DISEASE 08/11/2006   CVA 12/19/2006   2008   DIABETES MELLITUS, TYPE I 08/11/2006   ECZEMA, HANDS 12/15/2009   History of kidney stones    10+ years ago   HYPERLIPIDEMIA 08/11/2006   HYPERTENSION 08/11/2006   Hypothyroidism    Myocardial infarction [facility])    Presence of permanent cardiac pacemaker    Type 2 diabetes mellitus without complications (HCC) 12/20/2016   Past Surgical History:  Procedure Laterality Date   CARDIAC CATHETERIZATION N/A 01/16/2015   Procedure: Left Heart Cath and Coronary Angiography;  Surgeon: [name], MD;  Location: [facility] INVASIVE CV LAB;  Service: Cardiovascular;  Laterality: N/A;   CORONARY STENT PLACEMENT     EP IMPLANTABLE DEVICE N/A 01/10/2016   MDT Claria MRI Quad CRTD implanted by Dr [name]   INGUINAL HERNIA REPAIR Left 05/22/2020   Procedure:  LEFT INGUINAL HERNIA REPAIR WITH MESH;  Surgeon: Abigail Miyamoto, MD;  Location: [facility] OR;  Service: General;  Laterality: Left;   INSERT / REPLACE / REMOVE PACEMAKER     INSERTION OF MESH Left 05/22/2020   Procedure: INSERTION OF MESH;  Surgeon: Abigail Miyamoto, MD;  Location: [facility] OR;  Service: General;  Laterality: Left;   TEE WITHOUT CARDIOVERSION N/A 01/26/2015   Procedure: TRANSESOPHAGEAL ECHOCARDIOGRAM (TEE);  Surgeon: [name], MD;  Location: [facility] ENDOSCOPY;  Service: Cardiovascular;  Laterality: N/A;   TONSILECTOMY, ADENOIDECTOMY, BILATERAL MYRINGOTOMY AND TUBES     TONSILLECTOMY     WISDOM TOOTH EXTRACTION     WRIST FRACTURE SURGERY Right 1960   3rd grade      Allergies: Patient has no known allergies.  Medications: Prior to Admission medications   Medication Sig Start Date End Date Taking? Authorizing Provider  Blood Glucose Monitoring Suppl (ONETOUCH VERIO) w/Device KIT 1 Device by Does not apply route daily. 10/02/20   Romero Belling, MD  carvedilol (COREG) 6.25 MG tablet TAKE 1 TABLET(6.25 MG) BY MOUTH TWICE DAILY 01/07/22   Graciella Freer, PA-C  CINNAMON PO Take 1 tablet by mouth daily.    [provider]  clopidogrel (PLAVIX) 75 MG tablet TAKE 1 TABLET(75 MG) BY MOUTH DAILY 01/22/22   Amberly Livas, Fayrene Fearing, MD  Coenzyme Q10 (COQ10) 200 MG CAPS Take 200 mg by mouth in the morning.    [provider]  Continuous Glucose Sensor (DEXCOM G7 SENSOR) MISC USE FOR CONTINUOUS BLOOD GLUCOSE MONITORING. REPLACE EVERY 10 DAYS 08/21/22   Reather Littler, MD  dapagliflozin propanediol (FARXIGA) 10 MG TABS tablet TAKE 1 TABLET(10 MG) BY MOUTH DAILY  BEFORE BREAKFAST 08/27/22   Reather Littler, MD  ENTRESTO 49-51 MG TAKE 1 TABLET BY MOUTH EVERY MORNING AND EVERY NIGHT AT BEDTIME 12/04/21   Wendall Stade, MD  ezetimibe (ZETIA) 10 MG tablet TAKE 1 TABLET(10 MG) BY MOUTH DAILY 07/30/22   [name], MD  GARLIC PO Take 1 tablet by mouth daily.    [provider]   Glucagon (GVOKE HYPOPEN 2-PACK) 0.5 MG/0.1ML SOAJ Use for severe lows 05/15/22   Reather Littler, MD  glucose blood (ONETOUCH VERIO) test strip 1 each by Other route 2 (two) times daily. And lancets 2/day 10/25/16   Romero Belling, MD  HUMALOG KWIKPEN 100 UNIT/ML KwikPen INJECT UNDER THE SKIN 7 UNITS EVERY MORNING, 3 UNITS AT LUNCH, AND 7 UNITS AT DINNER. 06/30/22   Reather Littler, MD  Insulin Pen Needle (PEN NEEDLES) 32G X 4 MM MISC Use to inject insulin 4 times a day 02/21/22   Reather Littler, MD  levothyroxine (SYNTHROID) 125 MCG tablet Take 1 tablet (125 mcg total) by mouth daily. 05/15/22   Reather Littler, MD  Probiotic Product (PROBIOTIC PO) Take 1 capsule by mouth every morning.    [provider]  rosuvastatin (CRESTOR) 10 MG tablet TAKE 1 TABLET(10 MG) BY MOUTH DAILY 03/18/22   Wendall Stade, MD  tadalafil (CIALIS) 5 MG tablet TAKE 1 TABLET(5 MG) BY MOUTH DAILY 04/29/22   [name], MD  tamsulosin (FLOMAX) 0.4 MG CAPS capsule Take 0.4 mg by mouth daily. 04/25/22   [provider]  TOUJEO SOLOSTAR 300 UNIT/ML Solostar Pen ADMINISTER 38 UNITS UNDER THE SKIN EVERY DAY 08/13/22   Reather Littler, MD     Vital Signs:    Code Status:   Physical Exam  Imaging: No results found.  Labs:  CBC: Recent Labs    09/02/21 0258 09/02/21 0409 07/01/22 1550  WBC 10.7*  --  12.3*  HGB 15.6 15.6 14.2  HCT 46.0 46.0 42.2  PLT 162  --  142*    COAGS: Recent Labs    09/02/21 0258  INR 0.9    BMP: Recent Labs    09/02/21 0258 09/02/21 0409 02/11/22 0810 05/13/22 0828 06/18/22 0857 07/01/22 1550  NA 140   < > 141 140 144 133*  K 3.7   < > 4.3 4.2 4.5 4.1  CL 109   < > 105 105 108 98  CO2 24   < > 31 26 28 24   GLUCOSE 118*   < > 83 153* 70  70 192*  BUN 16   < > 17 20 18  27*  CALCIUM 8.8*   < > 9.4 9.1 9.5 8.3*  CREATININE 1.29*   < > 1.27 1.32 1.28 1.47*  GFRNONAA 60*  --   --   --   --  51*   < > = values in this interval not displayed.    LIVER FUNCTION  TESTS: Recent Labs    10/26/21 0903 05/01/22 0733 07/01/22 1550  BILITOT 0.4 0.5 0.8  AST 26 21 36  ALT 30 22 25   ALKPHOS 54 65 52  PROT 6.2 6.5 6.6  ALBUMIN 4.0 4.1 3.0*    Assessment and Plan: 72 year old male, ex-smoker, with past medical history significant for chronic kidney disease, AICD/pacemaker, CHF, coronary artery disease with prior MI/stenting, prior CVA 2008, diabetes, eczema, hyperlipidemia, hypertension, hypothyroidism, and diabetes.  He also has BPH with progressive urinary outflow obstruction despite use of Flomax.  He reports progressively poor stream and pain  with micturition with incomplete emptying.  Following consultation with Dr. Deanne Coffer on 07/12/2022 to discuss treatment options for his symptomatic BPH patient was deemed an appropriate candidate for prostate artery embolization and presents today for the procedure.Risks and benefits of procedure were discussed with the patient including, but not limited to bleeding, infection, vascular injury or contrast induced renal failure.  This interventional procedure involves the use of X-rays and because of the nature of the planned procedure, it is possible that we will have prolonged use of X-ray fluoroscopy.  Potential radiation risks to you include (but are not limited to) the following: - A slightly elevated risk for cancer  several years later in life. This risk is typically less than 0.5% percent. This risk is low in comparison to the normal incidence of human cancer, which is 33% for women and 50% for men according to the American [employer] Society. - Radiation induced injury can include skin redness, resembling a rash, tissue breakdown / ulcers and hair loss (which can be temporary or permanent).   The likelihood of either of these occurring depends on the difficulty of the procedure and whether you are sensitive to radiation due to previous procedures, disease, or genetic conditions.   IF your procedure requires a  prolonged use of radiation, you will be notified and given written instructions for further action.  It is your responsibility to monitor the irradiated area for the 2 weeks following the procedure and to notify your physician if you are concerned that you have suffered a radiation induced injury.    All of the patient's questions were answered, patient is agreeable to proceed.  Consent signed and in chart.      Electronically Signed: D. Jeananne Rama, PA-C 08/27/2022, 2:52 PM   I spent a total of 25 Minutes at the the patient's bedside AND on the patient's hospital floor or unit, greater than 50% of which was counseling/coordinating care for prostate artery embolization

## 2022-08-28 ENCOUNTER — Other Ambulatory Visit (HOSPITAL_COMMUNITY): Payer: Self-pay | Admitting: Interventional Radiology

## 2022-08-28 ENCOUNTER — Ambulatory Visit (HOSPITAL_COMMUNITY)
Admission: RE | Admit: 2022-08-28 | Discharge: 2022-08-28 | Disposition: A | Discharge status: Home / Self Care | Payer: Medicare Other | Source: Ambulatory Visit | Attending: Interventional Radiology | Admitting: Interventional Radiology

## 2022-08-28 ENCOUNTER — Encounter (HOSPITAL_COMMUNITY): Payer: Self-pay

## 2022-08-28 DIAGNOSIS — I509 Heart failure, unspecified: Secondary | ICD-10-CM | POA: Insufficient documentation

## 2022-08-28 DIAGNOSIS — N189 Chronic kidney disease, unspecified: Secondary | ICD-10-CM | POA: Insufficient documentation

## 2022-08-28 DIAGNOSIS — Z794 Long term (current) use of insulin: Secondary | ICD-10-CM | POA: Insufficient documentation

## 2022-08-28 DIAGNOSIS — I130 Hypertensive heart and chronic kidney disease with heart failure and stage 1 through stage 4 chronic kidney disease, or unspecified chronic kidney disease: Secondary | ICD-10-CM | POA: Insufficient documentation

## 2022-08-28 DIAGNOSIS — R3914 Feeling of incomplete bladder emptying: Secondary | ICD-10-CM | POA: Insufficient documentation

## 2022-08-28 DIAGNOSIS — Z79899 Other long term (current) drug therapy: Secondary | ICD-10-CM | POA: Insufficient documentation

## 2022-08-28 DIAGNOSIS — N401 Enlarged prostate with lower urinary tract symptoms: Secondary | ICD-10-CM | POA: Diagnosis present

## 2022-08-28 DIAGNOSIS — E785 Hyperlipidemia, unspecified: Secondary | ICD-10-CM | POA: Insufficient documentation

## 2022-08-28 DIAGNOSIS — I2510 Atherosclerotic heart disease of native coronary artery without angina pectoris: Secondary | ICD-10-CM | POA: Diagnosis not present

## 2022-08-28 DIAGNOSIS — Z955 Presence of coronary angioplasty implant and graft: Secondary | ICD-10-CM | POA: Diagnosis not present

## 2022-08-28 DIAGNOSIS — E1122 Type 2 diabetes mellitus with diabetic chronic kidney disease: Secondary | ICD-10-CM | POA: Diagnosis not present

## 2022-08-28 DIAGNOSIS — I252 Old myocardial infarction: Secondary | ICD-10-CM | POA: Diagnosis not present

## 2022-08-28 DIAGNOSIS — Z8673 Personal history of transient ischemic attack (TIA), and cerebral infarction without residual deficits: Secondary | ICD-10-CM | POA: Diagnosis not present

## 2022-08-28 DIAGNOSIS — E039 Hypothyroidism, unspecified: Secondary | ICD-10-CM | POA: Diagnosis not present

## 2022-08-28 DIAGNOSIS — Z87891 Personal history of nicotine dependence: Secondary | ICD-10-CM | POA: Insufficient documentation

## 2022-08-28 DIAGNOSIS — Z95810 Presence of automatic (implantable) cardiac defibrillator: Secondary | ICD-10-CM | POA: Insufficient documentation

## 2022-08-28 HISTORY — PX: IR US GUIDE VASC ACCESS LEFT: IMG2389

## 2022-08-28 HISTORY — PX: IR EMBO TUMOR ORGAN ISCHEMIA INFARCT INC GUIDE ROADMAPPING: IMG5449

## 2022-08-28 HISTORY — PX: IR ANGIOGRAM PELVIS SELECTIVE OR SUPRASELECTIVE: IMG661

## 2022-08-28 HISTORY — PX: IR ANGIOGRAM SELECTIVE EACH ADDITIONAL VESSEL: IMG667

## 2022-08-28 HISTORY — PX: IR US GUIDE VASC ACCESS RIGHT: IMG2390

## 2022-08-28 HISTORY — PX: IR EMBO ARTERIAL NOT HEMORR HEMANG INC GUIDE ROADMAPPING: IMG5448

## 2022-08-28 LAB — GLUCOSE, CAPILLARY
Glucose-Capillary: 288 mg/dL — ABNORMAL HIGH (ref 70–99)
Glucose-Capillary: 208 mg/dL — ABNORMAL HIGH (ref 70–99)
Glucose-Capillary: 36 mg/dL — CL (ref 70–99)
Glucose-Capillary: 60 mg/dL — ABNORMAL LOW (ref 70–99)
Glucose-Capillary: 137 mg/dL — ABNORMAL HIGH (ref 70–99)
Glucose-Capillary: 60 mg/dL — ABNORMAL LOW (ref 70–99)

## 2022-08-28 LAB — CBC WITH DIFFERENTIAL/PLATELET
Abs Immature Granulocytes: 0.03 10*3/uL (ref 0.00–0.07)
Basophils Absolute: 0.1 10*3/uL (ref 0.0–0.1)
Basophils Relative: 1 %
Eosinophils Absolute: 0.4 10*3/uL (ref 0.0–0.5)
Eosinophils Relative: 6 %
HCT: 44.7 % (ref 39.0–52.0)
Hemoglobin: 14.7 g/dL (ref 13.0–17.0)
Immature Granulocytes: 0 %
Lymphocytes Relative: 22 %
Lymphs Abs: 1.7 10*3/uL (ref 0.7–4.0)
MCH: 33.4 pg (ref 26.0–34.0)
MCHC: 32.9 g/dL (ref 30.0–36.0)
MCV: 101.6 fL — ABNORMAL HIGH (ref 80.0–100.0)
Monocytes Absolute: 1 10*3/uL (ref 0.1–1.0)
Monocytes Relative: 13 %
Neutro Abs: 4.6 10*3/uL (ref 1.7–7.7)
Neutrophils Relative %: 58 %
Platelets: 198 10*3/uL (ref 150–400)
RBC: 4.4 MIL/uL (ref 4.22–5.81)
RDW: 13.4 % (ref 11.5–15.5)
WBC: 7.8 10*3/uL (ref 4.0–10.5)
nRBC: 0 % (ref 0.0–0.2)

## 2022-08-28 LAB — BASIC METABOLIC PANEL
Anion gap: 10 (ref 5–15)
BUN: 21 mg/dL (ref 8–23)
CO2: 20 mmol/L — ABNORMAL LOW (ref 22–32)
Calcium: 8.4 mg/dL — ABNORMAL LOW (ref 8.9–10.3)
Chloride: 109 mmol/L (ref 98–111)
Creatinine, Ser: 1.2 mg/dL (ref 0.61–1.24)
GFR, Estimated: >60 mL/min (ref >60)
Glucose, Bld: 275 mg/dL — ABNORMAL HIGH (ref 70–99)
Potassium: 3.9 mmol/L (ref 3.5–5.1)
Sodium: 139 mmol/L (ref 135–145)

## 2022-08-28 LAB — PROTIME-INR
INR: 1 (ref 0.8–1.2)
Prothrombin Time: 13.8 seconds (ref 11.4–15.2)

## 2022-08-28 MED ORDER — CIPROFLOXACIN HCL 500 MG PO TABS: 500.0000 mg | ORAL_TABLET | Freq: Two times a day (BID) | ORAL | 0 refills | Status: DC

## 2022-08-28 MED ORDER — INSULIN ASPART 100 UNIT/ML IJ SOLN
INTRAMUSCULAR | Status: AC
  Filled 2022-08-28: qty 1

## 2022-08-28 MED ORDER — CIPROFLOXACIN IN D5W 400 MG/200ML IV SOLN
400.0000 mg | INTRAVENOUS | Status: AC
  Administered 2022-08-28: 400 mg via INTRAVENOUS
  Filled 2022-08-28: qty 200

## 2022-08-28 MED ORDER — NITROGLYCERIN 1 MG/10 ML FOR IR/CATH LAB
INTRA_ARTERIAL | Status: AC | PRN
  Administered 2022-08-28: 100 ug via INTRA_ARTERIAL
  Administered 2022-08-28: 50 ug via INTRA_ARTERIAL
  Administered 2022-08-28: 200 ug via INTRA_ARTERIAL

## 2022-08-28 MED ORDER — NITROGLYCERIN IN D5W 100-5 MCG/ML-% IV SOLN
INTRAVENOUS | Status: AC
  Filled 2022-08-28: qty 250

## 2022-08-28 MED ORDER — IOHEXOL 300 MG/ML  SOLN
300.0000 mL | Freq: Once | INTRAMUSCULAR | Status: AC | PRN
  Administered 2022-08-28: 151 mL via INTRA_ARTERIAL

## 2022-08-28 MED ORDER — LIDOCAINE HCL 1 % IJ SOLN
INTRAMUSCULAR | Status: AC
  Filled 2022-08-28: qty 20

## 2022-08-28 MED ORDER — PHENAZOPYRIDINE HCL 100 MG PO TABS: 100.0000 mg | ORAL_TABLET | Freq: Three times a day (TID) | ORAL | 0 refills | Status: AC | PRN

## 2022-08-28 MED ORDER — INSULIN ASPART 100 UNIT/ML IJ SOLN
3.0000 [IU] | Freq: Once | INTRAMUSCULAR | Status: AC
  Administered 2022-08-28: 3 [IU] via SUBCUTANEOUS

## 2022-08-28 MED ORDER — NAPROXEN 500 MG PO TABS: 500.0000 mg | ORAL_TABLET | Freq: Two times a day (BID) | ORAL | 0 refills | Status: DC

## 2022-08-28 MED ORDER — FENTANYL CITRATE (PF) 100 MCG/2ML IJ SOLN
INTRAMUSCULAR | Status: AC | PRN
  Administered 2022-08-28 (×3): 50 ug via INTRAVENOUS

## 2022-08-28 MED ORDER — HYDROCODONE-ACETAMINOPHEN 5-325 MG PO TABS: 1.0000 | ORAL_TABLET | ORAL | Status: DC | PRN

## 2022-08-28 MED ORDER — KETOROLAC TROMETHAMINE 30 MG/ML IJ SOLN
15.0000 mg | INTRAMUSCULAR | Status: AC
  Administered 2022-08-28: 15 mg via INTRAVENOUS
  Filled 2022-08-28: qty 1

## 2022-08-28 MED ORDER — SODIUM CHLORIDE 0.9 % IV SOLN: INTRAVENOUS | Status: DC

## 2022-08-28 MED ORDER — KETOROLAC TROMETHAMINE 30 MG/ML IJ SOLN
INTRAMUSCULAR | Status: AC
  Filled 2022-08-28: qty 1

## 2022-08-28 MED ORDER — MIDAZOLAM HCL 2 MG/2ML IJ SOLN
INTRAMUSCULAR | Status: AC | PRN
  Administered 2022-08-28 (×3): 1 mg via INTRAVENOUS

## 2022-08-28 MED ORDER — SOLIFENACIN SUCCINATE 5 MG PO TABS: 5.0000 mg | ORAL_TABLET | Freq: Every day | ORAL | 0 refills | Status: DC

## 2022-08-28 MED ORDER — KETOROLAC TROMETHAMINE 30 MG/ML IJ SOLN
INTRAMUSCULAR | Status: AC | PRN
  Administered 2022-08-28: 30 mg via INTRAVENOUS

## 2022-08-28 MED ORDER — MIDAZOLAM HCL 2 MG/2ML IJ SOLN
INTRAMUSCULAR | Status: AC
  Filled 2022-08-28: qty 4

## 2022-08-28 MED ORDER — FENTANYL CITRATE (PF) 100 MCG/2ML IJ SOLN
INTRAMUSCULAR | Status: AC
  Filled 2022-08-28: qty 4

## 2022-08-28 MED ORDER — DEXTROSE 50 % IV SOLN
INTRAVENOUS | Status: AC
  Administered 2022-08-28: 25 mL
  Filled 2022-08-28: qty 50

## 2022-08-28 MED ORDER — ACETAMINOPHEN 500 MG PO TABS
1000.0000 mg | ORAL_TABLET | Freq: Once | ORAL | Status: AC
  Administered 2022-08-28: 1000 mg via ORAL
  Filled 2022-08-28: qty 2

## 2022-08-28 NOTE — Discharge Instructions (Addendum)
Embolization, Care After (Prostate)  The following information offers guidance on how to care for yourself after your procedure. Your health care provider may also give you more specific instructions. If you have problems or questions, contact your health care provider.  May remove dressing or bandaid and shower tomorrow.  Keep site clean and dry. Replace with clean dressing or bandaid as necessary.  Urgent needs IR clinic [phone] (mon-fri 8-5).  As per Dr. Deanne Coffer, you may resume your Plavix tomorrow, 08/29/2022  What can I expect after the procedure? After the procedure, it is common to have pain or bruising at the area where your catheter was inserted (insertion site). You may have other symptoms depending on the part of your body that was treated. For example: Embolization of the arteries  may cause: loss of appetite or nausea, or  have pain or cramps. Follow these instructions at home:  Insertion site care  If the site starts to bleed, lie down flat and put pressure on the site. If the bleeding does not stop, get help right away. This is a medical emergency. See radial site instructions. Follow instructions from your health care provider about how to take care of your insertion site. Make sure you: Wash your hands with soap and water for at least 20 seconds before and after you change your bandage (dressing). If soap and water are not available, use hand sanitizer. Change your dressing as told by your health care provider. Leave stitches (sutures), skin glue, or adhesive strips in place. These skin closures may need to stay in place for 2 weeks or longer. If adhesive strip edges start to loosen and curl up, you may trim the loose edges. Do not remove adhesive strips completely unless your health care provider tells you to do that. Keep the insertion site clean. To do this: Gently wash the site with plain soap and water. Pat the area dry with a clean towel. Do not rub the site.  This may cause bleeding. Check your insertion site every day for signs of infection. Check for: Redness, swelling, or pain. Fluid or blood. Warmth. Pus or a bad smell. Do not take baths, swim, or use a hot tub until your health care provider approves. You may be allowed to shower 24-48 hours after the procedure. Activity  You may have to avoid lifting. Ask your health care provider how much you can safely lift. If you were given a sedative during the procedure, it can affect you for several hours. Do not drive or operate machinery until your health care provider says that it is safe. Return to your normal activities as told by your health care provider. Ask your health care provider what activities are safe for you. General instructions  Take over-the-counter and prescription medicines only as told by your health care provider. Ask your health care provider if the medicine prescribed to you: Requires you to avoid driving or using machinery. Can cause constipation. You may need to take these actions to prevent or treat constipation: Drink enough fluid to keep your urine pale yellow. Take over-the-counter or prescription medicines. Eat foods that are high in fiber, such as beans, whole grains, and fresh fruits and vegetables. Limit foods that are high in fat and processed sugars, such as fried or sweet foods. Keep all follow-up visits. This is important. Contact a health care provider if: You have pain that gets worse or does not get better with medicine. You have redness, swelling, or pain around your insertion  site. You have fluid or blood coming from your insertion site. Your insertion site feels warm to the touch. You have pus or a bad smell coming from your insertion site. You have a fever. You have nausea or vomiting. Get help right away if: The insertion area swells very quickly. The insertion area is bleeding, and the bleeding does not stop after you hold steady pressure on the  area. The area near or just beyond the insertion site becomes pale, cool, tingly, or numb. You faint or feel like you might faint. You have chest pain. You have trouble breathing. You feel weak or have trouble moving your arms or legs. You have problems with balance, speech, or vision. These symptoms may be an emergency. Get help right away. Call 911. Do not wait to see if the symptoms will go away. Do not drive yourself to the hospital. Summary After your procedure, it is common to have pain or bruising at the area where your catheter was inserted. If you were given a sedative during the procedure, it can affect you for several hours. Do not drive or operate machinery until your health care provider says that it is safe. Follow instructions from your health care provider about how to take care of your insertion site and about activity restrictions. Contact a health care provider if you have a fever or if you have redness, swelling, or pain around your insertion site. If the site starts to bleed, lie down flat and put pressure on the site. If the bleeding does not stop, get help right away. This is a medical emergency. This information is not intended to replace advice given to you by your health care provider. Make sure you discuss any questions you have with your health care provider. Document Revised: 12/11/2020 Document Reviewed: 11/12/2020 Elsevier Patient Education  2023 [employer].

## 2022-08-28 NOTE — Progress Notes (Signed)
CBG 60 15 minutes after 8 oz apple juice. Patient eating saltines, graham crackers, peanut butter, and cheddar cheese. Patient denies s/s of hypoglycemia. Provided with 4 oz OJ. Will recheck CBG in 15 minutes.

## 2022-08-28 NOTE — Progress Notes (Signed)
CBG 156 15 min after 12.5 G D50 IV. PA Allred present at bedside and aware.

## 2022-08-28 NOTE — Sedation Documentation (Signed)
Blood glucose checked and found to be 36.Pt denies any signs and symptoms of low blood sugar. Dr. Deanne Coffer aware. No interventions given . Take pt to short stay let him eat

## 2022-08-30 ENCOUNTER — Encounter (HOSPITAL_COMMUNITY): Payer: Self-pay | Admitting: Interventional Radiology

## 2022-08-30 ENCOUNTER — Other Ambulatory Visit (HOSPITAL_COMMUNITY): Payer: Self-pay | Admitting: Interventional Radiology

## 2022-08-30 ENCOUNTER — Encounter (HOSPITAL_COMMUNITY): Payer: Self-pay | Admitting: Radiology

## 2022-08-30 DIAGNOSIS — N401 Enlarged prostate with lower urinary tract symptoms: Secondary | ICD-10-CM

## 2022-09-02 ENCOUNTER — Encounter: Payer: Self-pay | Admitting: Cardiovascular Disease

## 2022-09-02 ENCOUNTER — Ambulatory Visit: Payer: Medicare Other | Attending: Cardiovascular Disease | Admitting: Cardiovascular Disease

## 2022-09-02 VITALS — BP 110/64 | HR 60 | Ht 69.0 in | Wt 181.6 lb

## 2022-09-02 DIAGNOSIS — I10 Essential (primary) hypertension: Secondary | ICD-10-CM | POA: Diagnosis not present

## 2022-09-02 NOTE — Progress Notes (Signed)
  Electrophysiology Office Note:    Date:  09/02/2022   ID:  RODDRICK RUTIGLIANO, DOB 10-24-1950, MRN [id number]  PCP:  [name], MD   [facility] Providers Cardiologist:  Charlton Haws, MD Electrophysiologist:  Hillis Range, MD (Inactive)     Referring MD: [name], MD   History of Present Illness:    John Carrillo is a 72 y.o. male with a medical history significant for coronary disease status post MI, ischemic cardiomyopathy, left bundle branch block, stroke in 2016, diabetes, who presents for device follow-up.     He has a complicated history.  He had an anterior MI in 2003 and received tPA and rescue LAD PCI at [facility]. Apparently, ICD was recommended in 2008, but he declined.  He had a STEMI in 2016 with intervention to the RCA.  Ejection fraction at that time was 15%.  He was readmitted with occipital lobe stroke and in 2017 underwent placement of a Medtronic CRT D.     he has no device related complaints -- no new tenderness, drainage, redness.   He reports that he is doing very well.  He exercises regularly with a trainer at the [facility].  He recently returned from a [place] river boat cruise on the [place].  EKGs/Labs/Other Studies Reviewed Today:    Echocardiogram:  TTE September 02, 2021 EF 35 to 40%.  Grade 1 diastolic dysfunction.   Monitors:    Stress testing:    Advanced imaging:    Cardiac catherization    EKG:   EKG Interpretation Date/Time:  Monday September 02 2022 09:30:50 EDT Ventricular Rate:  60 PR Interval:  166 QRS Duration:  158 QT Interval:  526 QTC Calculation: 526 R Axis:   -85  Text Interpretation: AV dual-paced rhythm Biventricular pacemaker detected When compared with ECG of 02-Sep-2021 02:45, A-pacing today Confirmed by York Pellant [phone]) on 09/02/2022 10:02:11 AM     Physical Exam:    VS:  BP 110/64   Pulse 60   Ht 5\' 9"  (1.753 m)   Wt 181 lb 9.6 oz (82.4 kg)   SpO2 95%   BMI 26.82 kg/m     Wt  Readings from Last 3 Encounters:  09/02/22 181 lb 9.6 oz (82.4 kg)  08/28/22 170 lb (77.1 kg)  07/22/22 170 lb (77.1 kg)     GEN: Well nourished, well developed in no acute distress CARDIAC: RRR, no murmurs, rubs, gallops RESPIRATORY:  Normal work of breathing MUSCULOSKELETAL: no edema    ASSESSMENT & PLAN:    Medtronic BiV ICD Placed for ischemic cardiomyopathy with left bundle branch block Device is functioning normally He is not device-dependent  Tachycardia Appears to be AVNRT with P's on R's; though AT with PR = RR possible We will increase the lower threshold of his VT zone from 167 to 180 Episodes are brief; will continue to monitor for now He is asymptomatic    Signed, Maurice Small, MD  09/02/2022 10:20 AM    [facility]

## 2022-09-02 NOTE — Patient Instructions (Signed)

## 2022-09-03 ENCOUNTER — Encounter: Payer: Self-pay | Admitting: Endocrinology

## 2022-09-03 ENCOUNTER — Ambulatory Visit: Payer: Medicare Other | Admitting: Endocrinology

## 2022-09-03 VITALS — BP 120/70 | HR 60 | Ht 69.0 in | Wt 181.4 lb

## 2022-09-03 DIAGNOSIS — Z7984 Long term (current) use of oral hypoglycemic drugs: Secondary | ICD-10-CM

## 2022-09-03 DIAGNOSIS — E119 Type 2 diabetes mellitus without complications: Secondary | ICD-10-CM

## 2022-09-03 DIAGNOSIS — E1165 Type 2 diabetes mellitus with hyperglycemia: Secondary | ICD-10-CM

## 2022-09-03 DIAGNOSIS — Z794 Long term (current) use of insulin: Secondary | ICD-10-CM

## 2022-09-03 LAB — POCT GLYCOSYLATED HEMOGLOBIN (HGB A1C): Hemoglobin A1C: 7.2 % — AB (ref 4.0–5.6)

## 2022-09-03 NOTE — Progress Notes (Signed)
Patient ID: John Carrillo, male   DOB: 1950-03-11, 72 y.o.   MRN: [id number]             Reason for Appointment: Type II Diabetes follow-up   History of Present Illness   Diagnosis date: 2008  Previous history:  Oral hypoglycemic drugs previously used are: [place] from 8/21 Insulin was started soon after his diagnosis and he has been mostly on glargine insulin with Humalog Not clear whether he was treated with metformin previously  A1c range in the last few years is:7.4-9.4  Previous insulin regimen: 28 Toujeo  once a day in a.m. Humalog before meals: 4-6 at breakfast--4 lunch-4-5 after dinner       Recent history:     Non-insulin hypoglycemic drugs: Farxiga 10 mg daily       Side effects from medications: Nausea from Ozempic  T-slim insulin pump settings: Basal settings: 12 AM = 1.0, 12 PM = 0 point 7 and 6 PM = 1.0.  Carb ratio 1:10 with correction 1: 50  Current self management, blood sugar patterns and problems identified:  A1c is 7.2 today  He just started the insulin pump yesterday.  Diabetes educator from the company started this for him He previously had been taking somewhat empirical insulin bolus for meals but appears to be generally taking coverage of 1: 15 for meals  However he would still take 4 units for salads at lunch Prior to starting the pump on the last visit his time in range was only 54% and had inconsistent blood sugars after meals and also variable overnight Yesterday apparently did not take a bolus for meals before eating and only took 2 units correction doses postprandially when the blood sugars were in the low 200 range both after lunch and dinner His blood sugar was low normal around 6:30 PM and also had mild hypoglycemia around 3 AM last night.  However around waking up time this morning his blood sugars were in the 70-80 range Currently he has been programmed to use the exercise mode all the time by the diabetes educator He exercises usually  at 8 in the morning for 1 hour at least and he thinks his blood sugar tends to go down at the start of the exercise   Exercise: At the gym 3-4/7 days in ams, at least 1 hour  Previous CGM data:  CGM use % of time   2-week average/GV 178/37  Time in range      54  %  % Time Above 180 29  % Time above 250 14  % Time Below 70 2    Dietician visit: Most recent: At the time of diagnosis  Weight control:Max wt 205  Wt Readings from Last 3 Encounters:  09/03/22 181 lb 6.4 oz (82.3 kg)  09/02/22 181 lb 9.6 oz (82.4 kg)  08/28/22 170 lb (77.1 kg)            Diabetes labs:  Lab Results  Component Value Date   HGBA1C 7.2 (A) 09/03/2022   HGBA1C 7.0 (H) 05/13/2022   HGBA1C 6.9 (H) 02/11/2022   Lab Results  Component Value Date   MICROALBUR <0.7 07/08/2022   LDLCALC 77 05/01/2022   CREATININE 1.20 08/28/2022     Allergies as of 09/03/2022   No Known Allergies      Medication List        Accurate as of September 03, 2022  9:20 AM. If you have any questions, ask your nurse or doctor.  carvedilol 6.25 MG tablet Commonly known as: COREG TAKE 1 TABLET(6.25 MG) BY MOUTH TWICE DAILY   CINNAMON PO Take 1 tablet by mouth daily.   ciprofloxacin 500 MG tablet Commonly known as: Cipro Take 1 tablet (500 mg total) by mouth 2 (two) times daily.   clopidogrel 75 MG tablet Commonly known as: PLAVIX TAKE 1 TABLET(75 MG) BY MOUTH DAILY   CoQ10 200 MG Caps Take 200 mg by mouth in the morning.   Dexcom G7 Sensor Misc USE FOR CONTINUOUS BLOOD GLUCOSE MONITORING. REPLACE EVERY 10 DAYS   Entresto 49-51 MG Generic drug: sacubitril-valsartan TAKE 1 TABLET BY MOUTH EVERY MORNING AND EVERY NIGHT AT BEDTIME   ezetimibe 10 MG tablet Commonly known as: ZETIA TAKE 1 TABLET(10 MG) BY MOUTH DAILY   Farxiga 10 MG Tabs tablet Generic drug: dapagliflozin propanediol TAKE 1 TABLET(10 MG) BY MOUTH DAILY BEFORE BREAKFAST   GARLIC PO Take 1 tablet by mouth daily.   glucose  blood test strip Commonly known as: OneTouch Verio 1 each by Other route 2 (two) times daily. And lancets 2/day   Gvoke HypoPen 2-Pack 0.5 MG/0.1ML Soaj Generic drug: Glucagon Use for severe lows   HumaLOG KwikPen 100 UNIT/ML KwikPen Generic drug: insulin lispro INJECT UNDER THE SKIN 7 UNITS EVERY MORNING, 3 UNITS AT LUNCH, AND 7 UNITS AT DINNER.   levothyroxine 125 MCG tablet Commonly known as: SYNTHROID Take 1 tablet (125 mcg total) by mouth daily.   naproxen 500 MG tablet Commonly known as: Naprosyn Take 1 tablet (500 mg total) by mouth 2 (two) times daily with a meal.   OneTouch Verio w/Device Kit 1 Device by Does not apply route daily.   Pen Needles 32G X 4 MM Misc Use to inject insulin 4 times a day   phenazopyridine 100 MG tablet Commonly known as: Pyridium Take 1 tablet (100 mg total) by mouth 3 (three) times daily as needed for up to 7 days for pain.   PROBIOTIC PO Take 1 capsule by mouth every morning.   rosuvastatin 10 MG tablet Commonly known as: CRESTOR TAKE 1 TABLET(10 MG) BY MOUTH DAILY   solifenacin 5 MG tablet Commonly known as: VESIcare Take 1 tablet (5 mg total) by mouth daily.   tadalafil 5 MG tablet Commonly known as: CIALIS TAKE 1 TABLET(5 MG) BY MOUTH DAILY   tamsulosin 0.4 MG Caps capsule Commonly known as: FLOMAX Take 0.4 mg by mouth daily.   Toujeo SoloStar 300 UNIT/ML Solostar Pen Generic drug: insulin glargine (1 Unit Dial) ADMINISTER 38 UNITS UNDER THE SKIN EVERY DAY        Allergies: No Known Allergies  Past Medical History:  Diagnosis Date   Acute kidney injury superimposed on chronic kidney disease (HCC) 01/25/2015   AICD (automatic cardioverter/defibrillator) present    CHF (congestive heart failure) (HCC)    CORONARY ARTERY DISEASE 08/11/2006   CVA 12/19/2006   2008   DIABETES MELLITUS, TYPE I 08/11/2006   ECZEMA, HANDS 12/15/2009   History of kidney stones    10+ years ago   HYPERLIPIDEMIA 08/11/2006   HYPERTENSION  08/11/2006   Hypothyroidism    Myocardial infarction [facility])    Presence of permanent cardiac pacemaker    Type 2 diabetes mellitus without complications (HCC) 12/20/2016    Past Surgical History:  Procedure Laterality Date   CARDIAC CATHETERIZATION N/A 01/16/2015   Procedure: Left Heart Cath and Coronary Angiography;  Surgeon: [name], MD;  Location: [facility] INVASIVE CV LAB;  Service: Cardiovascular;  Laterality:  N/A;   CORONARY STENT PLACEMENT     EP IMPLANTABLE DEVICE N/A 01/10/2016   MDT Claria MRI Quad CRTD implanted by Dr [name]   INGUINAL HERNIA REPAIR Left 05/22/2020   Procedure: LEFT INGUINAL HERNIA REPAIR WITH MESH;  Surgeon: Abigail Miyamoto, MD;  Location: [facility] OR;  Service: General;  Laterality: Left;   INSERT / REPLACE / REMOVE PACEMAKER     INSERTION OF MESH Left 05/22/2020   Procedure: INSERTION OF MESH;  Surgeon: Abigail Miyamoto, MD;  Location[facility];  Service: General;  Laterality: Left;   IR ANGIOGRAM PELVIS SELECTIVE OR SUPRASELECTIVE  08/28/2022   IR ANGIOGRAM SELECTIVE EACH ADDITIONAL VESSEL  08/28/2022   IR ANGIOGRAM SELECTIVE EACH ADDITIONAL VESSEL  08/28/2022   IR ANGIOGRAM SELECTIVE EACH ADDITIONAL VESSEL  08/28/2022   IR EMBO TUMOR ORGAN ISCHEMIA INFARCT INC GUIDE ROADMAPPING  08/28/2022   IR US GUIDE VASC ACCESS LEFT  08/28/2022   IR US GUIDE VASC ACCESS LEFT  08/28/2022   IR US GUIDE VASC ACCESS LEFT  08/28/2022   IR US GUIDE VASC ACCESS RIGHT  08/28/2022   TEE WITHOUT CARDIOVERSION N/A 01/26/2015   Procedure: TRANSESOPHAGEAL ECHOCARDIOGRAM (TEE);  Surgeon: [name], MD;  Location: [facility] ENDOSCOPY;  Service: Cardiovascular;  Laterality: N/A;   TONSILECTOMY, ADENOIDECTOMY, BILATERAL MYRINGOTOMY AND TUBES     TONSILLECTOMY     WISDOM TOOTH EXTRACTION     WRIST FRACTURE SURGERY Right 1960   3rd grade    Family History  Problem Relation Age of Onset   CAD Father    Other Mother        passed right before 59- tumor on kidney recent diagnosis   Dementia Mother         progressing rapidly   CAD Paternal Grandfather        30s   CAD Paternal Grandmother        45s   Brain cancer Maternal Grandmother    Lymphoma Sister        b cell   Celiac disease Sister    Diabetes Neg Hx     Social History:  reports that he quit smoking about 22 years ago. His smoking use included cigarettes. He started smoking about 52 years ago. He has a 30 pack-year smoking history. He has never used smokeless tobacco. He reports current alcohol use of about 3.0 standard drinks of alcohol per week. He reports that he does not use drugs.  Review of Systems:  Last diabetic eye exam date 9/22 showing mild nonproliferative background retinopathy  Last foot exam date: 1/24  Symptoms of neuropathy: Usually none Has history of erectile dysfunction  Hypertension:   Treatment includes Entresto and Coreg He does monitor at home  BP Readings from Last 3 Encounters:  09/03/22 120/70  09/02/22 110/64  08/28/22 134/83   Lab Results  Component Value Date   CREATININE 1.20 08/28/2022   CREATININE 1.47 (H) 07/01/2022   CREATININE 1.28 06/18/2022    Lipids: Managed with Crestor 10 mg prescribed by his cardiologist, also on Zetia    Lab Results  Component Value Date   CHOL 157 05/01/2022   CHOL 134 10/26/2021   CHOL 124 04/03/2021   Lab Results  Component Value Date   HDL 61 05/01/2022   HDL 56 10/26/2021   HDL 53 04/03/2021   Lab Results  Component Value Date   LDLCALC 77 05/01/2022   LDLCALC 63 10/26/2021   LDLCALC 51 04/03/2021   Lab Results  Component Value Date  TRIG 105 05/01/2022   TRIG 75 10/26/2021   TRIG 109 04/03/2021   Lab Results  Component Value Date   CHOLHDL 2.6 05/01/2022   CHOLHDL 2.4 10/26/2021   CHOLHDL 2.3 04/03/2021   Lab Results  Component Value Date   LDLDIRECT 85.0 07/11/2017   LDLDIRECT 90.0 02/17/2015   LDLDIRECT 100.0 12/23/2014   He has been on thyroid supplement since about 2008 He thinks his initial symptoms  were weight gain and fatigue  The dose was increased on his visit in June 23 Currently taking levothyroxine 112 mcg every morning but TSH is higher now  Lab Results  Component Value Date   TSH 3.82 07/08/2022   TSH 7.60 (H) 05/13/2022   TSH 2.91 10/01/2021   FREET4 1.18 10/01/2021       Examination:   BP 120/70   Pulse 60   Ht 5\' 9"  (1.753 m)   Wt 181 lb 6.4 oz (82.3 kg)   SpO2 97%   BMI 26.79 kg/m   Body mass index is 26.79 kg/m.   ASSESSMENT/ PLAN:    Diabetes type 2 insulin-dependent:   See history of present illness for detailed discussion of current diabetes management, blood sugar patterns and problems identified  Current regimen: Basal bolus insulin, Farxiga 5 mg daily  A1c is 7.2  With starting the insulin pump his blood sugars appear to be relatively more predictable and less variable compared to the insulin injections although he has been on the pump only since midday yesterday He is not understanding the need for taking boluses before eating which he has not done Also not understanding that his bolus will be adjusted based on his blood sugar by the pump automatically Currently his basal rate appears to be excessive probably at all times since he has been low at 3 AM and 6-7 PM and this morning fasting was low normal  Recommendations:  His basal rates were changed as follows 12 AM = 0.8, 12 PM = 0.60 and 6 PM will be 0.8 Carb ratio 1:15 He needs to start bolusing with entering carbohydrates at every meal regardless of Premeal blood sugar and this was emphasized He will enter the actual carbohydrates along with his glucose at the time of bolus Stop the exercise mode, this was done in the office Start using the exercise mode up to 30 minutes before starting to exercise until the exercise is finished when he can turn it off For low-carb meals such as salads he can do only 1 to 2 units of bolus based on his carbohydrate intake Discussed appropriate treatment  of hypoglycemia  HYPOTHYROIDISM:  TSH to be rechecked on the next visit  Follow-up 3 weeks   There are no Patient Instructions on file for this visit.  Total visit time for evaluation and management and counseling = 30 minutes   Caylan Chenard 09/03/2022, 9:20 AM

## 2022-09-10 ENCOUNTER — Other Ambulatory Visit: Payer: Self-pay | Admitting: Cardiovascular Disease

## 2022-09-11 ENCOUNTER — Telehealth: Payer: Self-pay | Admitting: Nutrition

## 2022-09-11 ENCOUNTER — Encounter: Payer: Self-pay | Admitting: Endocrinology

## 2022-09-11 ENCOUNTER — Other Ambulatory Visit: Payer: Self-pay

## 2022-09-11 DIAGNOSIS — E1165 Type 2 diabetes mellitus with hyperglycemia: Secondary | ICD-10-CM

## 2022-09-11 MED ORDER — HUMALOG MIX 50/50 KWIKPEN (50-50) 100 UNIT/ML ~~LOC~~ SUPN: PEN_INJECTOR | SUBCUTANEOUS | 11 refills | Status: AC

## 2022-09-11 MED ORDER — INSULIN LISPRO 100 UNIT/ML IJ SOLN: INTRAMUSCULAR | 11 refills | Status: DC

## 2022-09-11 NOTE — Telephone Encounter (Signed)
Patient came in from Dr. Remus Blake office needing help in making the changes to his pump settings.  Per Dr. Remus Blake written order basal rate was changed to : MN: 0.8, 12PM: 0.6, 6PM: 0.8, Carb ratio: MN-MN: 15. He had no final questions

## 2022-09-26 DIAGNOSIS — R3912 Poor urinary stream: Secondary | ICD-10-CM | POA: Diagnosis not present

## 2022-09-26 DIAGNOSIS — R3914 Feeling of incomplete bladder emptying: Secondary | ICD-10-CM | POA: Diagnosis not present

## 2022-09-27 ENCOUNTER — Ambulatory Visit: Payer: Medicare Other | Admitting: Endocrinology

## 2022-10-04 ENCOUNTER — Encounter: Payer: Self-pay | Admitting: Endocrinology

## 2022-10-04 ENCOUNTER — Ambulatory Visit: Payer: Medicare Other | Admitting: Endocrinology

## 2022-10-04 ENCOUNTER — Ambulatory Visit
Admission: RE | Admit: 2022-10-04 | Discharge: 2022-10-04 | Disposition: A | Discharge status: Home / Self Care | Payer: Medicare Other | Source: Ambulatory Visit | Attending: Radiology | Admitting: Radiology

## 2022-10-04 VITALS — BP 95/60 | HR 75 | Ht 69.0 in | Wt 176.4 lb

## 2022-10-04 DIAGNOSIS — Z794 Long term (current) use of insulin: Secondary | ICD-10-CM

## 2022-10-04 DIAGNOSIS — E039 Hypothyroidism, unspecified: Secondary | ICD-10-CM | POA: Diagnosis not present

## 2022-10-04 DIAGNOSIS — E119 Type 2 diabetes mellitus without complications: Secondary | ICD-10-CM

## 2022-10-04 DIAGNOSIS — E113299 Type 2 diabetes mellitus with mild nonproliferative diabetic retinopathy without macular edema, unspecified eye: Secondary | ICD-10-CM | POA: Diagnosis not present

## 2022-10-04 DIAGNOSIS — N401 Enlarged prostate with lower urinary tract symptoms: Secondary | ICD-10-CM

## 2022-10-04 NOTE — Progress Notes (Signed)
Patient ID: John Carrillo, male   DOB: [birth year], 72 y.o.   MRN: [id number]       Chief Complaint: Patient was consulted remotely today (TeleHealth) for Follow-up prostate artery embolization at the request of Allred,Darrell K.    Referring Physician(s): Modena Slater, MD  History of Present Illness: John Carrillo is a 72 y.o. male with h/o CAD post stenting complicated by CVA,  initially referred to [place] with progressive urinary outflow obstruction despite use of Flomax,progressively poor stream and pain with micturition, with incomplete emptying.  IPSS = 25 (severe). symptoms managed with tamsulosin with partial symptomatic relief .   He underwent uncomplicated bilateral prostate artery embolization from right femoral approach on 08/28/2022. On follow-up call today he describes significant improvement in symptoms.  He feels like his stream during micturition is markedly improved.  Pain with micturition has resolved.  He has some occasional pressure when initiating urination, but not every time.  His sensation of incomplete bladder emptying has resolved, he states when he is done he is done.  He is quite happy with symptom relief thus far.No new or unexpected symptoms.No problems at the femoral entry site.  Past Medical History:  Diagnosis Date   Acute kidney injury superimposed on chronic kidney disease (HCC) 01/25/2015   AICD (automatic cardioverter/defibrillator) present    CHF (congestive heart failure) (HCC)    CORONARY ARTERY DISEASE 08/11/2006   CVA 12/19/2006   2008   DIABETES MELLITUS, TYPE I 08/11/2006   ECZEMA, HANDS 12/15/2009   History of kidney stones    10+ years ago   HYPERLIPIDEMIA 08/11/2006   HYPERTENSION 08/11/2006   Hypothyroidism    Myocardial infarction [facility])    Presence of permanent cardiac pacemaker    Type 2 diabetes mellitus without complications (HCC) 12/20/2016    Past Surgical History:  Procedure Laterality Date   CARDIAC CATHETERIZATION N/A 01/16/2015    Procedure: Left Heart Cath and Coronary Angiography;  Surgeon: [name], MD;  Location: [facility] INVASIVE CV LAB;  Service: Cardiovascular;  Laterality: N/A;   CORONARY STENT PLACEMENT     EP IMPLANTABLE DEVICE N/A 01/10/2016   MDT Claria MRI Quad CRTD implanted by Dr [name]   INGUINAL HERNIA REPAIR Left 05/22/2020   Procedure: LEFT INGUINAL HERNIA REPAIR WITH MESH;  Surgeon: Abigail Miyamoto, MD;  Location[facility] OR;  Service: General;  Laterality: Left;   INSERT / REPLACE / REMOVE PACEMAKER     INSERTION OF MESH Left 05/22/2020   Procedure: INSERTION OF MESH;  Surgeon: Abigail Miyamoto, MD;  Location: [facility] OR;  Service: General;  Laterality: Left;   IR ANGIOGRAM PELVIS SELECTIVE OR SUPRASELECTIVE  08/28/2022   IR ANGIOGRAM SELECTIVE EACH ADDITIONAL VESSEL  08/28/2022   IR ANGIOGRAM SELECTIVE EACH ADDITIONAL VESSEL  08/28/2022   IR ANGIOGRAM SELECTIVE EACH ADDITIONAL VESSEL  08/28/2022   IR EMBO TUMOR ORGAN ISCHEMIA INFARCT INC GUIDE ROADMAPPING  08/28/2022   IR US GUIDE VASC ACCESS LEFT  08/28/2022   IR US GUIDE VASC ACCESS LEFT  08/28/2022   IR US GUIDE VASC ACCESS LEFT  08/28/2022   IR US GUIDE VASC ACCESS RIGHT  08/28/2022   TEE WITHOUT CARDIOVERSION N/A 01/26/2015   Procedure: TRANSESOPHAGEAL ECHOCARDIOGRAM (TEE);  Surgeon: [name], MD;  Location: [facility] ENDOSCOPY;  Service: Cardiovascular;  Laterality: N/A;   TONSILECTOMY, ADENOIDECTOMY, BILATERAL MYRINGOTOMY AND TUBES     TONSILLECTOMY     WISDOM TOOTH EXTRACTION     WRIST FRACTURE SURGERY Right 1960   3rd grade  Allergies: Patient has no known allergies.  Medications: Prior to Admission medications   Medication Sig Start Date End Date Taking? Authorizing Provider  Blood Glucose Monitoring Suppl (ONETOUCH VERIO) w/Device KIT 1 Device by Does not apply route daily. 10/02/20   Romero Belling, MD  carvedilol (COREG) 6.25 MG tablet TAKE 1 TABLET(6.25 MG) BY MOUTH TWICE DAILY 01/07/22   Graciella Freer, PA-C  CINNAMON PO Take 1  tablet by mouth daily.    [provider]  ciprofloxacin (CIPRO) 500 MG tablet Take 1 tablet (500 mg total) by mouth 2 (two) times daily. 08/28/22   Allred, Darrell K, PA-C  clopidogrel (PLAVIX) 75 MG tablet TAKE 1 TABLET(75 MG) BY MOUTH DAILY 01/22/22   Allred, Fayrene Fearing, MD  Coenzyme Q10 (COQ10) 200 MG CAPS Take 200 mg by mouth in the morning.    [provider]  Continuous Glucose Sensor (DEXCOM G7 SENSOR) MISC USE FOR CONTINUOUS BLOOD GLUCOSE MONITORING. REPLACE EVERY 10 DAYS 08/21/22   Reather Littler, MD  dapagliflozin propanediol (FARXIGA) 10 MG TABS tablet TAKE 1 TABLET(10 MG) BY MOUTH DAILY BEFORE BREAKFAST 08/27/22   Reather Littler, MD  ENTRESTO 49-51 MG TAKE 1 TABLET BY MOUTH EVERY MORNING AND EVERY NIGHT AT BEDTIME 12/04/21   Wendall Stade, MD  ezetimibe (ZETIA) 10 MG tablet TAKE 1 TABLET(10 MG) BY MOUTH DAILY 07/30/22   [name], MD  GARLIC PO Take 1 tablet by mouth daily.    [provider]  Glucagon (GVOKE HYPOPEN 2-PACK) 0.5 MG/0.1ML SOAJ Use for severe lows 05/15/22   Reather Littler, MD  glucose blood (ONETOUCH VERIO) test strip 1 each by Other route 2 (two) times daily. And lancets 2/day 10/25/16   Romero Belling, MD  HUMALOG KWIKPEN 100 UNIT/ML KwikPen INJECT UNDER THE SKIN 7 UNITS EVERY MORNING, 3 UNITS AT LUNCH, AND 7 UNITS AT DINNER. 06/30/22   Reather Littler, MD  insulin lispro (HUMALOG) 100 UNIT/ML injection Use 60 units per day via pump. 09/11/22   Thapa, [place], MD  Insulin Lispro Prot & Lispro (HUMALOG MIX 50/50 KWIKPEN) (50-50) 100 UNIT/ML Kwikpen Inject under the skin 7 units in every morning , 3 units at lunch and 7 units at dinner 09/11/22   Reather Littler, MD  Insulin Pen Needle (PEN NEEDLES) 32G X 4 MM MISC Use to inject insulin 4 times a day 02/21/22   Reather Littler, MD  levothyroxine (SYNTHROID) 125 MCG tablet Take 1 tablet (125 mcg total) by mouth daily. 05/15/22   Reather Littler, MD  naproxen (NAPROSYN) 500 MG tablet Take 1 tablet (500 mg total) by mouth 2 (two)  times daily with a meal. 08/28/22   Allred, Darrell K, PA-C  Probiotic Product (PROBIOTIC PO) Take 1 capsule by mouth every morning.    [provider]  rosuvastatin (CRESTOR) 10 MG tablet TAKE 1 TABLET(10 MG) BY MOUTH DAILY 09/10/22   Wendall Stade, MD  solifenacin (VESICARE) 5 MG tablet Take 1 tablet (5 mg total) by mouth daily. 08/28/22   Allred, Darrell K, PA-C  tadalafil (CIALIS) 5 MG tablet TAKE 1 TABLET(5 MG) BY MOUTH DAILY 04/29/22   [name], MD  tamsulosin (FLOMAX) 0.4 MG CAPS capsule Take 0.4 mg by mouth daily. 04/25/22   [provider]  TOUJEO SOLOSTAR 300 UNIT/ML Solostar Pen ADMINISTER 38 UNITS UNDER THE SKIN EVERY DAY 08/13/22   Reather Littler, MD     Family History  Problem Relation Age of Onset   CAD Father    Other Mother  passed right before 90- tumor on kidney recent diagnosis   Dementia Mother        progressing rapidly   CAD Paternal Grandfather        29s   CAD Paternal Grandmother        58s   Brain cancer Maternal Grandmother    Lymphoma Sister        b cell   Celiac disease Sister    Diabetes Neg Hx     Social History   Socioeconomic History   Marital status: Married    Spouse name: Not on file   Number of children: Not on file   Years of education: Not on file   Highest education level: Master's degree (e.g., MA, MS, MEng, MEd, MSW, MBA)  Occupational History   Occupation: Engineer, maintenance: [employer]  Tobacco Use   Smoking status: Former    Current packs/day: 0.00    Average packs/day: 1 pack/day for 30.0 years (30.0 ttl pk-yrs)    Types: Cigarettes    Start date: 05/04/1970    Quit date: 05/03/2000    Years since quitting: 22.4   Smokeless tobacco: Never  Vaping Use   Vaping status: Never Used  Substance and Sexual Activity   Alcohol use: Yes    Alcohol/week: 3.0 standard drinks of alcohol    Types: 1 Glasses of wine, 1 Cans of beer, 1 Shots of liquor per week    Comment: 1-2/month   Drug  use: No   Sexual activity: Not on file  Other Topics Concern   Not on file  Social History Narrative   Married (wife seen elsewhere- still working in Catering manager 2024). 3 step children, 1 biological. No grandkids.       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      Hobbies: race sail boats, adrenaline related activities   Social Determinants of Health   Financial Resource Strain: Low Risk  (07/18/2022)   Overall Financial Resource Strain (CARDIA)    Difficulty of Paying Living Expenses: Not hard at all  Food Insecurity: No Food Insecurity (07/18/2022)   Hunger Vital Sign    Worried About Running Out of Food in the Last Year: Never true    Ran Out of Food in the Last Year: Never true  Transportation Needs: No Transportation Needs (07/18/2022)   PRAPARE - Administrator, Civil Service (Medical): No    Lack of Transportation (Non-Medical): No  Physical Activity: Sufficiently Active (07/18/2022)   Exercise Vital Sign    Days of Exercise per Week: 5 days    Minutes of Exercise per Session: 60 min  Stress: No Stress Concern Present (07/18/2022)   [employer] of Occupational Health - Occupational Stress Questionnaire    Feeling of Stress : Not at all  Social Connections: Moderately Integrated (07/18/2022)   Social Connection and Isolation Panel [NHANES]    Frequency of Communication with Friends and Family: More than three times a week    Frequency of Social Gatherings with Friends and Family: Twice a week    Attends Religious Services: Never    Database administrator or Organizations: Yes    Attends Engineer, structural: More than 4 times per year    Marital Status: Married  Recent Concern: Social Connections - Moderately Isolated (07/04/2022)   Social Connection and Isolation Panel [NHANES]    Frequency of Communication with Friends and Family: Once a week  Frequency of Social Gatherings with Friends and Family: Once a week     Attends Religious Services: Never    Database administrator or Organizations: Yes    Attends Engineer, structural: 1 to 4 times per year    Marital Status: Married    ECOG Status: 1 - Symptomatic but completely ambulatory  Review of Systems  Review of Systems: A 12 point ROS discussed and pertinent positives are indicated in the HPI above.  All other systems are negative.    Physical Exam No direct physical exam was performed (except for noted visual exam findings with Video Visits).    Vital Signs: There were no vitals taken for this visit.  Imaging: No results found.  Labs:  CBC: Recent Labs    07/01/22 1550 08/28/22 0814  WBC 12.3* 7.8  HGB 14.2 14.7  HCT 42.2 44.7  PLT 142* 198    COAGS: Recent Labs    08/28/22 0814  INR 1.0    BMP: Recent Labs    05/13/22 0828 06/18/22 0857 07/01/22 1550 08/28/22 0814  NA 140 144 133* 139  K 4.2 4.5 4.1 3.9  CL 105 108 98 109  CO2 26 28 24  20*  GLUCOSE 153* 70  70 192* 275*  BUN 20 18 27* 21  CALCIUM 9.1 9.5 8.3* 8.4*  CREATININE 1.32 1.28 1.47* 1.20  GFRNONAA  --   --  51* >60    LIVER FUNCTION TESTS: Recent Labs    10/26/21 0903 05/01/22 0733 07/01/22 1550  BILITOT 0.4 0.5 0.8  AST 26 21 36  ALT 30 22 25   ALKPHOS 54 65 52  PROT 6.2 6.5 6.6  ALBUMIN 4.0 4.1 3.0*    TUMOR MARKERS: No results for input(s): "AFPTM", "CEA", "CA199", "CHROMGRNA" in the last 8760 hours.  Assessment and Plan:  My impression is that this patient has done markedly well   just 1 month post bilateral prostate artery utilization.  Typically takes 3-6 months for the patients to   obtain maximum symptom relief from the procedure.  I would Anticipate long-term durable result.  He will plan to follow-up primarily with Dr. Alvester Morin in urology.  I am happy to see him back or discuss over the phone should any questions, complications, or other issues arise.  There is low likelihood for need of repeat embolization in the  future. He knows to call with any questions or problems.   Thank you for this interesting consult.  I greatly enjoyed meeting John Carrillo and look forward to participating in their care.  A copy of this report was sent to the requesting provider on this date.  Electronically Signed: Durwin Glaze 10/04/2022, 11:43 AM   I spent a total of    15 Minutes in remote  clinical consultation, greater than 50% of which was counseling/coordinating care for Benign prostatic hypertrophy, status post prostate artery embolization..    Visit type: Audio only (telephone). Audio (no video) only due to patient's lack of internet/smartphone capability. Alternative for in-person consultation at [facility], [address] [place], [place], [place].  This format is felt to be most appropriate for this patient at this time.  All issues noted in this document were discussed and addressed.

## 2022-10-04 NOTE — Progress Notes (Incomplete)
Outpatient Endocrinology Note John Lameisha Schuenemann, MD  10/06/22  Patient's Name: John Carrillo    DOB: 08/22/1950    MRN: [id number]                                                    REASON OF VISIT: Follow up for type 2 diabetes mellitus  PCP: [name], MD  HISTORY OF PRESENT ILLNESS:   AAQIL MASSUCCI is a 72 y.o. old male with past medical history listed below, is here for follow up of type 2 diabetes mellitus.  Patient was last seen by Dr. Lucianne Muss in July 2024.  Pertinent Diabetes History: Patient was diagnosed with type 2 diabetes mellitus in 2008.  Insulin therapy was started soon after the diagnosis.  Prior to insulin pump therapy used to be on basal bolus regimen with glargine and Humalog.  Not clear whether he was treated with metformin previously.  Patient was restarted on insulin pump therapy Tandem mobi pump from end of July 2024.  Chronic Diabetes Complications : Retinopathy: Mild nonproliferative retinopathy.  Following with ophthalmology. Nephropathy: no Peripheral neuropathy: no Coronary artery disease: yes Stroke: yes  Relevant comorbidities and cardiovascular risk factors: Obesity: no Body mass index is 26.05 kg/m.  Hypertension: yes Hyperlipidemia. Yes, on a statin.  Current / Home Diabetic regimen includes:  Farxiga 10 mg daily.  Tandem Mobi pump  Insulin Pump setting:  Basal MN- 0.500u/hour 12PM- 0.600  6PM- 0.500  Bolus CHO Ratio (1unit:CHO) MN- 1:15  Correction/Sensitivity: MN- 1:50  Target: 110   Active insulin time: 5 hours  Prior diabetic medications: Ozempic stopped due to nausea.  CONTINUOUS GLUCOSE MONITORING SYSTEM (CGMS) / INSULIN PUMP INTERPRETATION:                         Tandem Mobi Pump & Sensor Download (Reviewed and summarized below.) Pump: Dexcom G7 and Tandem t:slim /Mobi Dates: August 17 to October 04, 2022   Glucose Management Indicator: 7.4% Sensor Average: 169 SD 48 CGM/Sensor usage:100%  Glycemic  Trends:  <54: 0% 54-70: 0.1% 71-180: 64% 181-250: 29% 251-400: 6.8%   Average daily carbs entered: 0g Average total daily insulin:  26.42 units, Basal: 48%, Bolus: 52%,  (Manual Bolus: 24%,  Control IQ Bolus: 76%)   Control IQ Time in Use: 96%  Changing cartridge every  5.5  days.   Changing tubing every 5.5 days. Changing site/canuala every 5.5 days.    Trends:  He is having hyperglycemia postprandially mostly with supper and occasionally with lunch.  Hyperglycemia up to 200-350 range.  Hyperglycemia is related to no Premeal boluses.  Sometimes hyperglycemia is extended to overnight.  Blood sugar early morning and sometime overnight are acceptable.  No significant hypoglycemia.  He is mostly getting auto correction boluses from control IQ, he is also doing override increase in correction boluses and occasionally manual boluses.  He has not been doing food boluses.    Hypoglycemia: Patient has no hypoglycemic episodes. Patient has hypoglycemia awareness.    Factors modifying glucose control: 1.  Diabetic diet assessment: 3 meals a day.  2.  Staying active or exercising: Exercising at gym.  3.  Medication compliance: compliant all of the time.  # Hypothyroidism : He was diagnosed with hypothyroidism in 2008 and has been on thyroid hormone replacement since  the diagnosis.  He is currently taking levothyroxine 125 mcg daily.   Interval history 10/06/22 He is getting used to on insulin pump.  He was recently started on insulin pump therapy from end of July.  Pump and CGM data as reviewed above.  No other complaints today.  He is currently taking levothyroxine 125 mcg daily.  No hypo and hyperthyroid symptoms.  REVIEW OF SYSTEMS As per history of present illness.   PAST MEDICAL HISTORY: Past Medical History:  Diagnosis Date   Acute kidney injury superimposed on chronic kidney disease (HCC) 01/25/2015   AICD (automatic cardioverter/defibrillator) present    CHF (congestive heart  failure) (HCC)    CORONARY ARTERY DISEASE 08/11/2006   CVA 12/19/2006   2008   DIABETES MELLITUS, TYPE I 08/11/2006   ECZEMA, HANDS 12/15/2009   History of kidney stones    10+ years ago   HYPERLIPIDEMIA 08/11/2006   HYPERTENSION 08/11/2006   Hypothyroidism    Myocardial infarction [facility])    Presence of permanent cardiac pacemaker    Type 2 diabetes mellitus without complications (HCC) 12/20/2016    PAST SURGICAL HISTORY: Past Surgical History:  Procedure Laterality Date   CARDIAC CATHETERIZATION N/A 01/16/2015   Procedure: Left Heart Cath and Coronary Angiography;  Surgeon: [name], MD;  Location: [facility] INVASIVE CV LAB;  Service: Cardiovascular;  Laterality: N/A;   CORONARY STENT PLACEMENT     EP IMPLANTABLE DEVICE N/A 01/10/2016   MDT Claria MRI Quad CRTD implanted by Dr [name]   INGUINAL HERNIA REPAIR Left 05/22/2020   Procedure: LEFT INGUINAL HERNIA REPAIR WITH MESH;  Surgeon: Abigail Miyamoto, MD;  Location: [facility] OR;  Service: General;  Laterality: Left;   INSERT / REPLACE / REMOVE PACEMAKER     INSERTION OF MESH Left 05/22/2020   Procedure: INSERTION OF MESH;  Surgeon: Abigail Miyamoto, MD;  Location: [facility] OR;  Service: General;  Laterality: Left;   IR ANGIOGRAM PELVIS SELECTIVE OR SUPRASELECTIVE  08/28/2022   IR ANGIOGRAM SELECTIVE EACH ADDITIONAL VESSEL  08/28/2022   IR ANGIOGRAM SELECTIVE EACH ADDITIONAL VESSEL  08/28/2022   IR ANGIOGRAM SELECTIVE EACH ADDITIONAL VESSEL  08/28/2022   IR EMBO TUMOR ORGAN ISCHEMIA INFARCT INC GUIDE ROADMAPPING  08/28/2022   IR US GUIDE VASC ACCESS LEFT  08/28/2022   IR US GUIDE VASC ACCESS LEFT  08/28/2022   IR US GUIDE VASC ACCESS LEFT  08/28/2022   IR US GUIDE VASC ACCESS RIGHT  08/28/2022   TEE WITHOUT CARDIOVERSION N/A 01/26/2015   Procedure: TRANSESOPHAGEAL ECHOCARDIOGRAM (TEE);  Surgeon: [name], MD;  Location: [facility] ENDOSCOPY;  Service: Cardiovascular;  Laterality: N/A;   TONSILECTOMY, ADENOIDECTOMY, BILATERAL MYRINGOTOMY AND TUBES      TONSILLECTOMY     WISDOM TOOTH EXTRACTION     WRIST FRACTURE SURGERY Right 1960   3rd grade    ALLERGIES: No Known Allergies  FAMILY HISTORY:  Family History  Problem Relation Age of Onset   CAD Father    Other Mother        passed right before 15- tumor on kidney recent diagnosis   Dementia Mother        progressing rapidly   CAD Paternal Grandfather        95s   CAD Paternal Grandmother        62s   Brain cancer Maternal Grandmother    Lymphoma Sister        b cell   Celiac disease Sister    Diabetes Neg Hx  SOCIAL HISTORY: Social History   Socioeconomic History   Marital status: Married    Spouse name: Not on file   Number of children: Not on file   Years of education: Not on file   Highest education level: Master's degree (e.g., MA, MS, MEng, MEd, MSW, MBA)  Occupational History   Occupation: Engineer, maintenance: [employer]  Tobacco Use   Smoking status: Former    Current packs/day: 0.00    Average packs/day: 1 pack/day for 30.0 years (30.0 ttl pk-yrs)    Types: Cigarettes    Start date: 05/04/1970    Quit date: 05/03/2000    Years since quitting: 22.4   Smokeless tobacco: Never  Vaping Use   Vaping status: Never Used  Substance and Sexual Activity   Alcohol use: Yes    Alcohol/week: 3.0 standard drinks of alcohol    Types: 1 Glasses of wine, 1 Cans of beer, 1 Shots of liquor per week    Comment: 1-2/month   Drug use: No   Sexual activity: Not on file  Other Topics Concern   Not on file  Social History Narrative   Married (wife seen elsewhere- still working in Catering manager 2024). 3 step children, 1 biological. No grandkids.       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      Hobbies: race sail boats, adrenaline related activities   Social Determinants of Health   Financial Resource Strain: Low Risk  (07/18/2022)   Overall Financial Resource Strain (CARDIA)    Difficulty of Paying Living  Expenses: Not hard at all  Food Insecurity: No Food Insecurity (07/18/2022)   Hunger Vital Sign    Worried About Running Out of Food in the Last Year: Never true    Ran Out of Food in the Last Year: Never true  Transportation Needs: No Transportation Needs (07/18/2022)   PRAPARE - Administrator, Civil Service (Medical): No    Lack of Transportation (Non-Medical): No  Physical Activity: Sufficiently Active (07/18/2022)   Exercise Vital Sign    Days of Exercise per Week: 5 days    Minutes of Exercise per Session: 60 min  Stress: No Stress Concern Present (07/18/2022)   [employer] of Occupational Health - Occupational Stress Questionnaire    Feeling of Stress : Not at all  Social Connections: Moderately Integrated (07/18/2022)   Social Connection and Isolation Panel [NHANES]    Frequency of Communication with Friends and Family: More than three times a week    Frequency of Social Gatherings with Friends and Family: Twice a week    Attends Religious Services: Never    Database administrator or Organizations: Yes    Attends Engineer, structural: More than 4 times per year    Marital Status: Married  Recent Concern: Social Connections - Moderately Isolated (07/04/2022)   Social Connection and Isolation Panel [NHANES]    Frequency of Communication with Friends and Family: Once a week    Frequency of Social Gatherings with Friends and Family: Once a week    Attends Religious Services: Never    Database administrator or Organizations: Yes    Attends Engineer, structural: 1 to 4 times per year    Marital Status: Married    MEDICATIONS:  Current Outpatient Medications  Medication Sig Dispense Refill   Blood Glucose Monitoring Suppl (ONETOUCH VERIO) w/Device KIT 1 Device by Does not apply route daily. 1 kit  0   carvedilol (COREG) 6.25 MG tablet TAKE 1 TABLET(6.25 MG) BY MOUTH TWICE DAILY 180 tablet 3   CINNAMON PO Take 1 tablet by mouth daily.      clopidogrel (PLAVIX) 75 MG tablet TAKE 1 TABLET(75 MG) BY MOUTH DAILY 90 tablet 3   Coenzyme Q10 (COQ10) 200 MG CAPS Take 200 mg by mouth in the morning.     Continuous Glucose Sensor (DEXCOM G7 SENSOR) MISC USE FOR CONTINUOUS BLOOD GLUCOSE MONITORING. REPLACE EVERY 10 DAYS 3 each 3   dapagliflozin propanediol (FARXIGA) 10 MG TABS tablet TAKE 1 TABLET(10 MG) BY MOUTH DAILY BEFORE BREAKFAST 90 tablet 0   ENTRESTO 49-51 MG TAKE 1 TABLET BY MOUTH EVERY MORNING AND EVERY NIGHT AT BEDTIME 180 tablet 3   ezetimibe (ZETIA) 10 MG tablet TAKE 1 TABLET(10 MG) BY MOUTH DAILY 90 tablet 3   GARLIC PO Take 1 tablet by mouth daily.     Glucagon (GVOKE HYPOPEN 2-PACK) 0.5 MG/0.1ML SOAJ Use for severe lows 0.2 mL 1   glucose blood (ONETOUCH VERIO) test strip 1 each by Other route 2 (two) times daily. And lancets 2/day 200 each 12   HUMALOG KWIKPEN 100 UNIT/ML KwikPen INJECT UNDER THE SKIN 7 UNITS EVERY MORNING, 3 UNITS AT LUNCH, AND 7 UNITS AT DINNER. 30 mL 0   insulin lispro (HUMALOG) 100 UNIT/ML injection Use 60 units per day via pump. 10 mL 11   Insulin Lispro Prot & Lispro (HUMALOG MIX 50/50 KWIKPEN) (50-50) 100 UNIT/ML Kwikpen Inject under the skin 7 units in every morning , 3 units at lunch and 7 units at dinner 30 mL 11   Insulin Pen Needle (PEN NEEDLES) 32G X 4 MM MISC Use to inject insulin 4 times a day 100 each 2   levothyroxine (SYNTHROID) 125 MCG tablet Take 1 tablet (125 mcg total) by mouth daily. 90 tablet 3   naproxen (NAPROSYN) 500 MG tablet Take 1 tablet (500 mg total) by mouth 2 (two) times daily with a meal. 14 tablet 0   Probiotic Product (PROBIOTIC PO) Take 1 capsule by mouth every morning.     rosuvastatin (CRESTOR) 10 MG tablet TAKE 1 TABLET(10 MG) BY MOUTH DAILY 90 tablet 3   tadalafil (CIALIS) 5 MG tablet TAKE 1 TABLET(5 MG) BY MOUTH DAILY 90 tablet 3   tamsulosin (FLOMAX) 0.4 MG CAPS capsule Take 0.4 mg by mouth daily.     TOUJEO SOLOSTAR 300 UNIT/ML Solostar Pen ADMINISTER 38 UNITS UNDER  THE SKIN EVERY DAY 6 mL 3   ciprofloxacin (CIPRO) 500 MG tablet Take 1 tablet (500 mg total) by mouth 2 (two) times daily. 14 tablet 0   solifenacin (VESICARE) 5 MG tablet Take 1 tablet (5 mg total) by mouth daily. 7 tablet 0   No current facility-administered medications for this visit.    PHYSICAL EXAM: Vitals:   10/04/22 0936  BP: 95/60  Pulse: 75  SpO2: 98%  Weight: 176 lb 6.4 oz (80 kg)  Height: 5\' 9"  (1.753 m)   Body mass index is 26.05 kg/m.  Wt Readings from Last 3 Encounters:  10/04/22 176 lb 6.4 oz (80 kg)  10/04/22 176 lb (79.8 kg)  09/03/22 181 lb 6.4 oz (82.3 kg)    General: Well developed, well nourished male in no apparent distress.  HEENT: AT/[place], no external lesions.  Eyes: Conjunctiva clear and no icterus. Neck: Neck supple  Lungs: Respirations not labored Neurologic: Alert, oriented, normal speech Extremities / Skin: Dry. No sores or rashes noted.  Pump site okay.  Psychiatric: Does not appear depressed or anxious  Diabetic Foot Exam - Simple   No data filed    LABS Reviewed Lab Results  Component Value Date   HGBA1C 7.2 (A) 09/03/2022   HGBA1C 7.0 (H) 05/13/2022   HGBA1C 6.9 (H) 02/11/2022   Lab Results  Component Value Date   FRUCTOSAMINE 319 (H) 06/18/2022   FRUCTOSAMINE 413 (H) 11/24/2015   Lab Results  Component Value Date   CHOL 157 05/01/2022   HDL 61 05/01/2022   LDLCALC 77 05/01/2022   LDLDIRECT 85.0 07/11/2017   TRIG 105 05/01/2022   CHOLHDL 2.6 05/01/2022   Lab Results  Component Value Date   MICRALBCREAT 1.6 07/08/2022   MICRALBCREAT 1.2 07/13/2021   Lab Results  Component Value Date   CREATININE 1.20 08/28/2022   Lab Results  Component Value Date   GFR 56.59 (L) 06/18/2022    ASSESSMENT / PLAN  1. Insulin dependent type 2 diabetes mellitus (HCC)   2. Background diabetic retinopathy associated with type 2 diabetes mellitus (HCC)   3. Adult hypothyroidism   4. Primary hypothyroidism     Diabetes Mellitus type  2, complicated by diabetic retinopathy. - Diabetic status / severity: Fair control.  Lab Results  Component Value Date   HGBA1C 7.2 (A) 09/03/2022    - Hemoglobin A1c goal <7%   The diagnosis of diabetes, was mostly used type 2 diabetes and sometimes used as type 1 diabetes as well.  Patient is not clear.  Patient reports he used to have variable blood sugar with hyperglycemia and hypoglycemia prior to starting on insulin pump therapy.  -I would like to check type 1 diabetes autoimmune panel.  He had undetectably low C-peptide with blood sugar 70 in May 2024.  - Medications:  Patient has not been bolusing for meals and the pump.  I advised to bolus for each meal from the pump.  Insulin pump setting changed as follows: Insulin Pump setting:  Farxiga 10 mg daily.  Tandem Mobi pump  Insulin Pump setting:  Basal MN- 0.500u/hour 12PM- 0.600  6PM- 0.500  Bolus CHO Ratio (1unit:CHO) MN- 1:15, changed to 1:18  Correction/Sensitivity: MN- 1:50, changed to 1:60  Target: 110   Active insulin time: 5 hours  Discussed about using exercise mode in the pump when exercising.  - Home glucose testing: continue CGM and check blood glucose as needed.  - Discussed/ Gave Hypoglycemia treatment plan.  # Consult : not required at this time.   # Annual urine for microalbuminuria/ creatinine ratio, no microalbuminuria currently. Last  Lab Results  Component Value Date   MICRALBCREAT 1.6 07/08/2022    # Foot check nightly / neuropathy.  # Annual dilated diabetic eye exams.   - Diet: Make healthy diabetic food choices - Life style / activity / exercise: discussed.  2. Blood pressure  -  BP Readings from Last 1 Encounters:  10/04/22 95/60    - Control is in target.  - No change in current plans.  3. Lipid status / Hyperlipidemia - Last  Lab Results  Component Value Date   LDLCALC 77 05/01/2022   - Continue rosuvastatin 20 mg daily and Zetia 10 mg daily, managed by  cardiology.    # Primary hypothyroidism -Currently taking levothyroxine 125 mcg daily.  TSH was normal 3.82 in June 2024.  Will continue the current dose.  Diagnoses and all orders for this visit:  Insulin dependent type 2 diabetes mellitus (HCC) -  Glutamic acid decarboxylase auto abs; Future -     IA-2 Antibody; Future -     ZNT8 Antibodies; Future -     ZNT8 Antibodies -     IA-2 Antibody -     Glutamic acid decarboxylase auto abs  Background diabetic retinopathy associated with type 2 diabetes mellitus (HCC)  Adult hypothyroidism  Primary hypothyroidism   DISPOSITION Follow up in clinic in 3 months suggested.   All questions answered and patient verbalized understanding of the plan.  John Anabeth Chilcott, MD [facility] [address] [place], [address] [place], [place] [address] Phone # [phone]  At least part of this note was generated using voice recognition software. Inadvertent word errors may have occurred, which were not recognized during the proofreading process.

## 2022-10-06 ENCOUNTER — Encounter: Payer: Self-pay | Admitting: Endocrinology

## 2022-10-09 ENCOUNTER — Ambulatory Visit (INDEPENDENT_AMBULATORY_CARE_PROVIDER_SITE_OTHER): Payer: Medicare Other

## 2022-10-09 DIAGNOSIS — I255 Ischemic cardiomyopathy: Secondary | ICD-10-CM

## 2022-10-09 LAB — CUP PACEART REMOTE DEVICE CHECK
Battery Remaining Longevity: 29 mo
Battery Voltage: 2.93 V
Brady Statistic AP VP Percent: 36.57 %
Brady Statistic AP VS Percent: 0.59 %
Brady Statistic AS VP Percent: 61.82 %
Brady Statistic AS VS Percent: 1.02 %
Brady Statistic RA Percent Paced: 36.94 %
Brady Statistic RV Percent Paced: 35.83 %
Date Time Interrogation Session: 20240904061807
HighPow Impedance: 71 Ohm
Implantable Lead Connection Status: 753985
Implantable Lead Connection Status: 753985
Implantable Lead Connection Status: 753985
Implantable Lead Implant Date: 20171206
Implantable Lead Implant Date: 20171206
Implantable Lead Implant Date: 20171206
Implantable Lead Location: 753858
Implantable Lead Location: 753859
Implantable Lead Location: 753860
Implantable Lead Model: 4598
Implantable Lead Model: 5076
Implantable Pulse Generator Implant Date: 20171206
Lead Channel Impedance Value: 190 Ohm
Lead Channel Impedance Value: 194.634
Lead Channel Impedance Value: 194.634
Lead Channel Impedance Value: 194.634
Lead Channel Impedance Value: 199.5 Ohm
Lead Channel Impedance Value: 342 Ohm
Lead Channel Impedance Value: 380 Ohm
Lead Channel Impedance Value: 380 Ohm
Lead Channel Impedance Value: 399 Ohm
Lead Channel Impedance Value: 399 Ohm
Lead Channel Impedance Value: 399 Ohm
Lead Channel Impedance Value: 437 Ohm
Lead Channel Impedance Value: 437 Ohm
Lead Channel Impedance Value: 665 Ohm
Lead Channel Impedance Value: 665 Ohm
Lead Channel Impedance Value: 665 Ohm
Lead Channel Impedance Value: 665 Ohm
Lead Channel Impedance Value: 703 Ohm
Lead Channel Pacing Threshold Amplitude: 0.5 V
Lead Channel Pacing Threshold Amplitude: 1 V
Lead Channel Pacing Threshold Amplitude: 1.25 V
Lead Channel Pacing Threshold Pulse Width: 0.4 ms
Lead Channel Pacing Threshold Pulse Width: 0.4 ms
Lead Channel Pacing Threshold Pulse Width: 0.4 ms
Lead Channel Sensing Intrinsic Amplitude: 14.5 mV
Lead Channel Sensing Intrinsic Amplitude: 14.5 mV
Lead Channel Sensing Intrinsic Amplitude: 4.375 mV
Lead Channel Sensing Intrinsic Amplitude: 4.375 mV
Lead Channel Setting Pacing Amplitude: 1.75 V
Lead Channel Setting Pacing Amplitude: 2 V
Lead Channel Setting Pacing Amplitude: 2.5 V
Lead Channel Setting Pacing Pulse Width: 0.4 ms
Lead Channel Setting Pacing Pulse Width: 0.4 ms
Lead Channel Setting Sensing Sensitivity: 0.3 mV
Zone Setting Status: 755011
Zone Setting Status: 755011

## 2022-10-09 NOTE — Progress Notes (Deleted)
John Carrillo, CMA  ?

## 2022-10-11 ENCOUNTER — Encounter: Payer: Self-pay | Admitting: Cardiovascular Disease

## 2022-10-11 ENCOUNTER — Other Ambulatory Visit: Payer: Self-pay | Admitting: Cardiovascular Disease

## 2022-10-11 MED ORDER — ENTRESTO 49-51 MG PO TABS: 1.0000 | ORAL_TABLET | Freq: Two times a day (BID) | ORAL | 3 refills | Status: DC

## 2022-10-13 LAB — ZNT8 ANTIBODIES: ZNT8 Antibodies: <10 U/mL (ref <15)

## 2022-10-13 LAB — GLUTAMIC ACID DECARBOXYLASE AUTO ABS: Glutamic Acid Decarb Ab: 19 [IU]/mL — ABNORMAL HIGH (ref <5)

## 2022-10-13 LAB — IA-2 ANTIBODY: IA-2 Antibody: 8.3 U/mL — ABNORMAL HIGH (ref <5.4)

## 2022-10-15 NOTE — Progress Notes (Unsigned)
This encounter was created in error - please disregard.

## 2022-10-22 NOTE — Progress Notes (Signed)
Remote ICD transmission.   

## 2022-11-05 NOTE — Progress Notes (Signed)
CARDIOLOGY CONSULT NOTE       Patient ID: John Carrillo MRN: [id number] DOB/AGE: [birth year] 72 y.o.  Primary Physician: [name], MD Primary Cardiologist: [name] Reason for F/U:  Ischemic DCM/AICD     HPI:   John Carrillo is a 72 y.o. male with a hx of CAD status post anterior MI 20 years ago treated with stenting of the LAD, ICM, LBBB, prior CVA, HL, DM 2.  He was evaluated by Dr. [name] in 2008 for ICD implantation for primary prevention.  He declined at that time.  Had SEMI 2016 with RCA intervention EF 15% Readmitted 01/23/17 with right occipital lobe CVA placed on Brillinta and Eliquis. Eventually agreed to MDT CRTD device implant 2017   SEen by [facility] EP 09/02/22 and having some tachycardia ? 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 Also has a bipolar son living independantly  In [place]   No cardiac complaints    Had IR prostate embolization for hesitancy despite flomax Rx   Had a nice [employer] crews through [place] Daughter doing crew in [place]   ROS All other systems reviewed and negative except as noted above  Past Medical History:  Diagnosis Date   Acute kidney injury superimposed on chronic kidney disease (HCC) 01/25/2015   AICD (automatic cardioverter/defibrillator) present    CHF (congestive heart failure) (HCC)    CORONARY ARTERY DISEASE 08/11/2006   CVA 12/19/2006   2008   DIABETES MELLITUS, TYPE I 08/11/2006   ECZEMA, HANDS 12/15/2009   History of kidney stones    10+ years ago   HYPERLIPIDEMIA 08/11/2006   HYPERTENSION 08/11/2006    Hypothyroidism    Myocardial infarction [facility])    Presence of permanent cardiac pacemaker    Type 2 diabetes mellitus without complications (HCC) 12/20/2016    Family History  Problem Relation Age of Onset   CAD Father    Other Mother        passed right before 3- tumor on kidney recent diagnosis   Dementia Mother        progressing rapidly   CAD Paternal Grandfather        65s   CAD Paternal Grandmother        1s   Brain cancer Maternal Grandmother    Lymphoma Sister        b cell   Celiac disease Sister    Diabetes Neg Hx     Social History   Socioeconomic History   Marital status: Married    Spouse name: Not on file   Number of children: Not on file   Years of education: Not on file   Highest education level: Master's degree (e.g., MA, MS, MEng, MEd, MSW, MBA)  Occupational History   Occupation: Engineer, maintenance: [employer]  Tobacco Use   Smoking status: Former    Current packs/day: 0.00    Average packs/day: 1 pack/day for 30.0 years (30.0 ttl pk-yrs)    Types: Cigarettes    Start date: 05/04/1970    Quit date: 05/03/2000  Years since quitting: 22.5   Smokeless tobacco: Never  Vaping Use   Vaping status: Never Used  Substance and Sexual Activity   Alcohol use: Yes    Alcohol/week: 3.0 standard drinks of alcohol    Types: 1 Glasses of wine, 1 Cans of beer, 1 Shots of liquor per week    Comment: 1-2/month   Drug use: No   Sexual activity: Not on file  Other Topics Concern   Not on file  Social History Narrative   Married (wife seen elsewhere- still working in Catering manager 2024). 3 step children, 1 biological. No grandkids.       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      Hobbies: race sail boats, adrenaline related activities   Social Determinants of Health   Financial Resource Strain: Low Risk  (07/18/2022)   Overall Financial Resource Strain (CARDIA)    Difficulty of Paying Living Expenses: Not  hard at all  Food Insecurity: No Food Insecurity (07/18/2022)   Hunger Vital Sign    Worried About Running Out of Food in the Last Year: Never true    Ran Out of Food in the Last Year: Never true  Transportation Needs: No Transportation Needs (07/18/2022)   PRAPARE - Administrator, Civil Service (Medical): No    Lack of Transportation (Non-Medical): No  Physical Activity: Sufficiently Active (07/18/2022)   Exercise Vital Sign    Days of Exercise per Week: 5 days    Minutes of Exercise per Session: 60 min  Stress: No Stress Concern Present (07/18/2022)   [employer] of Occupational Health - Occupational Stress Questionnaire    Feeling of Stress : Not at all  Social Connections: Moderately Integrated (07/18/2022)   Social Connection and Isolation Panel [NHANES]    Frequency of Communication with Friends and Family: More than three times a week    Frequency of Social Gatherings with Friends and Family: Twice a week    Attends Religious Services: Never    Database administrator or Organizations: Yes    Attends Engineer, structural: More than 4 times per year    Marital Status: Married  Recent Concern: Social Connections - Moderately Isolated (07/04/2022)   Social Connection and Isolation Panel [NHANES]    Frequency of Communication with Friends and Family: Once a week    Frequency of Social Gatherings with Friends and Family: Once a week    Attends Religious Services: Never    Database administrator or Organizations: Yes    Attends Banker Meetings: 1 to 4 times per year    Marital Status: Married  Catering manager Violence: Not At Risk (07/22/2022)   Humiliation, Afraid, Rape, and Kick questionnaire    Fear of Current or Ex-Partner: No    Emotionally Abused: No    Physically Abused: No    Sexually Abused: No    Past Surgical History:  Procedure Laterality Date   CARDIAC CATHETERIZATION N/A 01/16/2015   Procedure: Left Heart Cath and Coronary  Angiography;  Surgeon: [name], MD;  Location: [facility]IVE [facility];  Service: Cardiovascular;  Laterality: N/A;   CORONARY STENT PLACEMENT     EP IMPLANTABLE DEVICE N/A 01/10/2016   MDT Claria MRI Quad CRTD implanted by Dr [name]   INGUINAL HERNIA REPAIR Left 05/22/2020   Procedure: LEFT INGUINAL HERNIA REPAIR WITH MESH;  Surgeon: Abigail Miyamoto, MD;  Location: [facility] OR;  Service: General;  Laterality: Left;  INSERT / REPLACE / REMOVE PACEMAKER     INSERTION OF MESH Left 05/22/2020   Procedure: INSERTION OF MESH;  Surgeon: Abigail Miyamoto, MD;  Location: [facility] OR;  Service: General;  Laterality: Left;   IR ANGIOGRAM PELVIS SELECTIVE OR SUPRASELECTIVE  08/28/2022   IR ANGIOGRAM SELECTIVE EACH ADDITIONAL VESSEL  08/28/2022   IR ANGIOGRAM SELECTIVE EACH ADDITIONAL VESSEL  08/28/2022   IR ANGIOGRAM SELECTIVE EACH ADDITIONAL VESSEL  08/28/2022   IR EMBO TUMOR ORGAN ISCHEMIA INFARCT INC GUIDE ROADMAPPING  08/28/2022   IR US GUIDE VASC ACCESS LEFT  08/28/2022   IR US GUIDE VASC ACCESS LEFT  08/28/2022   IR US GUIDE VASC ACCESS LEFT  08/28/2022   IR US GUIDE VASC ACCESS RIGHT  08/28/2022   TEE WITHOUT CARDIOVERSION N/A 01/26/2015   Procedure: TRANSESOPHAGEAL ECHOCARDIOGRAM (TEE);  Surgeon: [name], MD;  Location: [facility] ENDOSCOPY;  Service: Cardiovascular;  Laterality: N/A;   TONSILECTOMY, ADENOIDECTOMY, BILATERAL MYRINGOTOMY AND TUBES     TONSILLECTOMY     WISDOM TOOTH EXTRACTION     WRIST FRACTURE SURGERY Right 1960   3rd grade      Current Outpatient Medications:    Blood Glucose Monitoring Suppl (ONETOUCH VERIO) w/Device KIT, 1 Device by Does not apply route daily., Disp: 1 kit, Rfl: 0   carvedilol (COREG) 6.25 MG tablet, TAKE 1 TABLET(6.25 MG) BY MOUTH TWICE DAILY, Disp: 180 tablet, Rfl: 3   CINNAMON PO, Take 1 tablet by mouth daily., Disp: , Rfl:    clopidogrel (PLAVIX) 75 MG tablet, TAKE 1 TABLET(75 MG) BY MOUTH DAILY, Disp: 90 tablet, Rfl: 3   Coenzyme Q10 (COQ10) 200 MG CAPS, Take  200 mg by mouth in the morning., Disp: , Rfl:    Continuous Glucose Sensor (DEXCOM G7 SENSOR) MISC, USE FOR CONTINUOUS BLOOD GLUCOSE MONITORING. REPLACE EVERY 10 DAYS, Disp: 3 each, Rfl: 3   dapagliflozin propanediol (FARXIGA) 10 MG TABS tablet, TAKE 1 TABLET(10 MG) BY MOUTH DAILY BEFORE BREAKFAST, Disp: 90 tablet, Rfl: 0   ezetimibe (ZETIA) 10 MG tablet, TAKE 1 TABLET(10 MG) BY MOUTH DAILY, Disp: 90 tablet, Rfl: 3   GARLIC PO, Take 1 tablet by mouth daily., Disp: , Rfl:    Glucagon (GVOKE HYPOPEN 2-PACK) 0.5 MG/0.1ML SOAJ, Use for severe lows, Disp: 0.2 mL, Rfl: 1   glucose blood (ONETOUCH VERIO) test strip, 1 each by Other route 2 (two) times daily. And lancets 2/day, Disp: 200 each, Rfl: 12   HUMALOG KWIKPEN 100 UNIT/ML KwikPen, INJECT UNDER THE SKIN 7 UNITS EVERY MORNING, 3 UNITS AT LUNCH, AND 7 UNITS AT DINNER., Disp: 30 mL, Rfl: 0   insulin lispro (HUMALOG) 100 UNIT/ML injection, Use 60 units per day via pump., Disp: 10 mL, Rfl: 11   Insulin Lispro Prot & Lispro (HUMALOG MIX 50/50 KWIKPEN) (50-50) 100 UNIT/ML Kwikpen, Inject under the skin 7 units in every morning , 3 units at lunch and 7 units at dinner, Disp: 30 mL, Rfl: 11   Insulin Pen Needle (PEN NEEDLES) 32G X 4 MM MISC, Use to inject insulin 4 times a day, Disp: 100 each, Rfl: 2   levothyroxine (SYNTHROID) 125 MCG tablet, Take 1 tablet (125 mcg total) by mouth daily., Disp: 90 tablet, Rfl: 3   Probiotic Product (PROBIOTIC PO), Take 1 capsule by mouth every morning., Disp: , Rfl:    rosuvastatin (CRESTOR) 10 MG tablet, TAKE 1 TABLET(10 MG) BY MOUTH DAILY, Disp: 90 tablet, Rfl: 3   sacubitril-valsartan (ENTRESTO) 49-51 MG, Take 1 tablet by mouth  2 (two) times daily., Disp: 180 tablet, Rfl: 3   tadalafil (CIALIS) 5 MG tablet, TAKE 1 TABLET(5 MG) BY MOUTH DAILY, Disp: 90 tablet, Rfl: 3   tamsulosin (FLOMAX) 0.4 MG CAPS capsule, Take 0.4 mg by mouth daily., Disp: , Rfl:    TOUJEO SOLOSTAR 300 UNIT/ML Solostar Pen, ADMINISTER 38 UNITS UNDER  THE SKIN EVERY DAY, Disp: 6 mL, Rfl: 3    Physical Exam: Blood pressure 100/62, pulse 60, height 5\' 9"  (1.753 m), weight 168 lb (76.2 kg), SpO2 95%.    Affect appropriate Healthy:  appears stated age HEENT: normal Neck supple with no adenopathy JVP normal no bruits no thyromegaly Lungs clear with no wheezing and good diaphragmatic motion Heart:  S1/S2 no murmur,enlarged PMI AICD under left clavicle  Abdomen: benighn, post left inguinal hernia repair with mesh  Distal pulses intact with no bruits No edema Neuro non-focal Skin warm and dry No muscular weakness   Labs:   Lab Results  Component Value Date   WBC 7.8 08/28/2022   HGB 14.7 08/28/2022   HCT 44.7 08/28/2022   MCV 101.6 (H) 08/28/2022   PLT 198 08/28/2022   No results for input(s): "NA", "K", "CL", "CO2", "BUN", "CREATININE", "CALCIUM", "PROT", "BILITOT", "ALKPHOS", "ALT", "AST", "GLUCOSE" in the last 168 hours.  Invalid input(s): "LABALBU" Lab Results  Component Value Date   CKTOTAL 192 12/14/2006   CKMB 2.0 12/14/2006   TROPONINI 0.13 (H) 01/15/2015    Lab Results  Component Value Date   CHOL 157 05/01/2022   CHOL 134 10/26/2021   CHOL 124 04/03/2021   Lab Results  Component Value Date   HDL 61 05/01/2022   HDL 56 10/26/2021   HDL 53 04/03/2021   Lab Results  Component Value Date   LDLCALC 77 05/01/2022   LDLCALC 63 10/26/2021   LDLCALC 51 04/03/2021   Lab Results  Component Value Date   TRIG 105 05/01/2022   TRIG 75 10/26/2021   TRIG 109 04/03/2021   Lab Results  Component Value Date   CHOLHDL 2.6 05/01/2022   CHOLHDL 2.4 10/26/2021   CHOLHDL 2.3 04/03/2021   Lab Results  Component Value Date   LDLDIRECT 85.0 07/11/2017   LDLDIRECT 90.0 02/17/2015   LDLDIRECT 100.0 12/23/2014      Radiology: No results found.  EKG: A pacing RBBB QRS 120 msec    ASSESSMENT AND PLAN:  CHF:  EF 35-40% by TTE 09/02/21  functional class one On good GDMT  CAD:  Post large anterior MI with  stenting of LAD 19 years ago and SEMI stenting ?RCA in 2016 no angina continue medical Rx CRT-AICD:  F/u EP Implant 2017 some AVNRT VT zone increased f/u Dr Nelly Laurence  HLD:  LDL at goal on crestor  DM:  Discussed low carb diet.  Target hemoglobin A1c is 6.5 or less.  Continue current medications. Last  Thyroid:  On replacement with synthroid  Hernia:  post left inguinal repair improved   F/U with EP 6 months and me in a year   Signed: Charlton Haws 11/11/2022, 8:06 AM

## 2022-11-11 ENCOUNTER — Ambulatory Visit: Payer: Medicare Other | Attending: Cardiovascular Disease | Admitting: Cardiovascular Disease

## 2022-11-11 ENCOUNTER — Encounter: Payer: Self-pay | Admitting: Family Medicine

## 2022-11-11 ENCOUNTER — Encounter: Payer: Self-pay | Admitting: Cardiovascular Disease

## 2022-11-11 VITALS — BP 100/62 | HR 60 | Ht 69.0 in | Wt 168.0 lb

## 2022-11-11 DIAGNOSIS — E782 Mixed hyperlipidemia: Secondary | ICD-10-CM | POA: Diagnosis not present

## 2022-11-11 DIAGNOSIS — I5020 Unspecified systolic (congestive) heart failure: Secondary | ICD-10-CM

## 2022-11-11 DIAGNOSIS — Z95810 Presence of automatic (implantable) cardiac defibrillator: Secondary | ICD-10-CM

## 2022-11-11 DIAGNOSIS — Z8673 Personal history of transient ischemic attack (TIA), and cerebral infarction without residual deficits: Secondary | ICD-10-CM | POA: Diagnosis not present

## 2022-11-11 DIAGNOSIS — I472 Ventricular tachycardia, unspecified: Secondary | ICD-10-CM

## 2022-11-11 MED ORDER — ROSUVASTATIN CALCIUM 10 MG PO TABS: 10.0000 mg | ORAL_TABLET | Freq: Every day | ORAL | 3 refills | Status: DC

## 2022-11-11 NOTE — Patient Instructions (Signed)

## 2022-11-18 ENCOUNTER — Encounter: Payer: Self-pay | Admitting: Family Medicine

## 2022-12-12 DIAGNOSIS — E1065 Type 1 diabetes mellitus with hyperglycemia: Secondary | ICD-10-CM | POA: Diagnosis not present

## 2022-12-17 ENCOUNTER — Other Ambulatory Visit: Payer: Self-pay | Admitting: Endocrinology

## 2022-12-23 ENCOUNTER — Other Ambulatory Visit: Payer: Self-pay

## 2022-12-23 DIAGNOSIS — E1165 Type 2 diabetes mellitus with hyperglycemia: Secondary | ICD-10-CM

## 2022-12-23 MED ORDER — DAPAGLIFLOZIN PROPANEDIOL 10 MG PO TABS
ORAL_TABLET | ORAL | 0 refills | Status: DC
Start: 2022-12-23 — End: 2023-03-21

## 2022-12-30 ENCOUNTER — Other Ambulatory Visit: Payer: Self-pay | Admitting: Student

## 2023-01-07 ENCOUNTER — Encounter: Payer: Self-pay | Admitting: Family Medicine

## 2023-01-08 ENCOUNTER — Ambulatory Visit: Payer: Medicare Other | Admitting: Endocrinology

## 2023-01-08 ENCOUNTER — Ambulatory Visit (INDEPENDENT_AMBULATORY_CARE_PROVIDER_SITE_OTHER): Payer: Medicare Other

## 2023-01-08 ENCOUNTER — Encounter: Payer: Self-pay | Admitting: Endocrinology

## 2023-01-08 VITALS — BP 100/60 | HR 60 | Resp 20 | Ht <= 58 in | Wt 170.6 lb

## 2023-01-08 DIAGNOSIS — E039 Hypothyroidism, unspecified: Secondary | ICD-10-CM | POA: Diagnosis not present

## 2023-01-08 DIAGNOSIS — E108 Type 1 diabetes mellitus with unspecified complications: Secondary | ICD-10-CM

## 2023-01-08 DIAGNOSIS — I472 Ventricular tachycardia, unspecified: Secondary | ICD-10-CM

## 2023-01-08 DIAGNOSIS — E10319 Type 1 diabetes mellitus with unspecified diabetic retinopathy without macular edema: Secondary | ICD-10-CM

## 2023-01-08 LAB — CUP PACEART REMOTE DEVICE CHECK
Battery Remaining Longevity: 26 mo
Battery Voltage: 2.92 V
Brady Statistic AP VP Percent: 41.92 %
Brady Statistic AP VS Percent: 0.57 %
Brady Statistic AS VP Percent: 56.48 %
Brady Statistic AS VS Percent: 1.02 %
Brady Statistic RA Percent Paced: 41.9 %
Brady Statistic RV Percent Paced: 42.58 %
Date Time Interrogation Session: 20241204033424
HighPow Impedance: 62 Ohm
Implantable Lead Connection Status: 753985
Implantable Lead Connection Status: 753985
Implantable Lead Connection Status: 753985
Implantable Lead Implant Date: 20171206
Implantable Lead Implant Date: 20171206
Implantable Lead Implant Date: 20171206
Implantable Lead Location: 753858
Implantable Lead Location: 753859
Implantable Lead Location: 753860
Implantable Lead Model: 4598
Implantable Lead Model: 5076
Implantable Pulse Generator Implant Date: 20171206
Lead Channel Impedance Value: 199.5 Ohm
Lead Channel Impedance Value: 199.5 Ohm
Lead Channel Impedance Value: 208.568
Lead Channel Impedance Value: 208.568
Lead Channel Impedance Value: 208.568
Lead Channel Impedance Value: 304 Ohm
Lead Channel Impedance Value: 380 Ohm
Lead Channel Impedance Value: 399 Ohm
Lead Channel Impedance Value: 399 Ohm
Lead Channel Impedance Value: 399 Ohm
Lead Channel Impedance Value: 399 Ohm
Lead Channel Impedance Value: 437 Ohm
Lead Channel Impedance Value: 456 Ohm
Lead Channel Impedance Value: 665 Ohm
Lead Channel Impedance Value: 703 Ohm
Lead Channel Impedance Value: 703 Ohm
Lead Channel Impedance Value: 722 Ohm
Lead Channel Impedance Value: 722 Ohm
Lead Channel Pacing Threshold Amplitude: 0.5 V
Lead Channel Pacing Threshold Amplitude: 1.25 V
Lead Channel Pacing Threshold Amplitude: 1.25 V
Lead Channel Pacing Threshold Pulse Width: 0.4 ms
Lead Channel Pacing Threshold Pulse Width: 0.4 ms
Lead Channel Pacing Threshold Pulse Width: 0.4 ms
Lead Channel Sensing Intrinsic Amplitude: 14.125 mV
Lead Channel Sensing Intrinsic Amplitude: 14.125 mV
Lead Channel Sensing Intrinsic Amplitude: 3 mV
Lead Channel Sensing Intrinsic Amplitude: 3 mV
Lead Channel Setting Pacing Amplitude: 1.75 V
Lead Channel Setting Pacing Amplitude: 2 V
Lead Channel Setting Pacing Amplitude: 2.5 V
Lead Channel Setting Pacing Pulse Width: 0.4 ms
Lead Channel Setting Pacing Pulse Width: 0.4 ms
Lead Channel Setting Sensing Sensitivity: 0.3 mV
Zone Setting Status: 755011
Zone Setting Status: 755011

## 2023-01-08 LAB — POCT GLYCOSYLATED HEMOGLOBIN (HGB A1C): Hemoglobin A1C: 8 % — AB (ref 4.0–5.6)

## 2023-01-08 NOTE — Progress Notes (Signed)
Outpatient Endocrinology Note John Raejean Swinford, MD  01/08/23  Patient's Name: John Carrillo    DOB: 1950/02/23    MRN: [id number]                                                    REASON OF VISIT: Follow up for type 1 diabetes mellitus  PCP: [name], MD  HISTORY OF PRESENT ILLNESS:   John Carrillo is a 72 y.o. old male with past medical history listed below, is here for follow up of type 1 diabetes mellitus.    Pertinent Diabetes History: Patient was diagnosed with type 2 diabetes mellitus in 2008.  Insulin therapy was started soon after the diagnosis.  Prior to insulin pump therapy used to be on basal bolus regimen with glargine and Humalog.  Not clear whether he was treated with metformin previously.  He had positive IA 2 and GAD 65 antibody positive consistent with type 1 diabetes mellitus in August 2024.  Patient was restarted on insulin pump therapy Tandem mobi pump from end of July 2024.  Chronic Diabetes Complications : Retinopathy: Mild nonproliferative retinopathy.  Following with ophthalmology. Nephropathy: no Peripheral neuropathy: no Coronary artery disease: yes Stroke: yes  Relevant comorbidities and cardiovascular risk factors: Obesity: no Body mass index is 832.95 kg/m.  Hypertension: yes Hyperlipidemia. Yes, on a statin.  Current / Home Diabetic regimen includes:  Farxiga 10 mg daily.  Tandem Mobi pump using Humalog U100  Insulin Pump setting:  Basal MN- 0.500u/hour 12PM- 0.600  6PM- 0.500  Bolus CHO Ratio (1unit:CHO) MN- 1:17  Correction/Sensitivity: MN- 1:50  Target: 110   Active insulin time: 5 hours  Prior diabetic medications: Ozempic stopped due to nausea.  CONTINUOUS GLUCOSE MONITORING SYSTEM (CGMS) / INSULIN PUMP INTERPRETATION:                         Tandem Mobi Pump & Sensor Download (Reviewed and summarized below.) Pump: Dexcom G7 and Tandem t:slim /Mobi Dates: November 21 to January 08, 2023   Glucose  Management Indicator: 7.6%    Average total daily insulin:  36 units, Basal: 54%, Bolus: 46%,  (Manual Bolus: 47%,  Control IQ Bolus: 53%)    Trends:  Frequent hyperglycemia postprandially related with meals with blood sugar up to 250s range related with not enough meal boluses and some time with late meal bolus and missed meal bolus.  He has been manually bolusing when he eats not using bolus wizard from the pump.  Last night he had significant hyperglycemia with blood sugar up to 300 due to cartridge and site issue after changing the site.  He has been frequently using sleep more.  No hypoglycemia.  Blood sugar in between the meals and overnight are mostly acceptable.  Hypoglycemia: Patient has no hypoglycemic episodes. Patient has hypoglycemia awareness.    Factors modifying glucose control: 1.  Diabetic diet assessment: 3 meals a day.  2.  Staying active or exercising: Exercising at gym.  3.  Medication compliance: compliant all of the time.  # Hypothyroidism : He was diagnosed with hypothyroidism in 2008 and has been on thyroid hormone replacement since the diagnosis.  He is currently taking levothyroxine 125 mcg daily.   Interval history  Pump and CGM data as reviewed above.  He is having frequent  hypoglycemia.  He has been manually bolusing for meals.  He has been taking levothyroxine 125 mg daily.  No complaints today. Hemoglobin A1c today 8% worsening.  REVIEW OF SYSTEMS As per history of present illness.   PAST MEDICAL HISTORY: Past Medical History:  Diagnosis Date   Acute kidney injury superimposed on chronic kidney disease (HCC) 01/25/2015   AICD (automatic cardioverter/defibrillator) present    CHF (congestive heart failure) (HCC)    CORONARY ARTERY DISEASE 08/11/2006   CVA 12/19/2006   2008   DIABETES MELLITUS, TYPE I 08/11/2006   ECZEMA, HANDS 12/15/2009   History of kidney stones    10+ years ago   HYPERLIPIDEMIA 08/11/2006   HYPERTENSION 08/11/2006    Hypothyroidism    Myocardial infarction [facility])    Presence of permanent cardiac pacemaker    Type 2 diabetes mellitus without complications (HCC) 12/20/2016    PAST SURGICAL HISTORY: Past Surgical History:  Procedure Laterality Date   CARDIAC CATHETERIZATION N/A 01/16/2015   Procedure: Left Heart Cath and Coronary Angiography;  Surgeon: [name], MD;  [facility] INVASIVE CV LAB;  Service: Cardiovascular;  Laterality: N/A;   CORONARY STENT PLACEMENT     EP IMPLANTABLE DEVICE N/A 01/10/2016   MDT Claria MRI Quad CRTD implanted by Dr [name]   INGUINAL HERNIA REPAIR Left 05/22/2020   Procedure: LEFT INGUINAL HERNIA REPAIR WITH MESH;  Surgeon: Abigail Miyamoto, MD;  Location: [facility] OR;  Service: General;  Laterality: Left;   INSERT / REPLACE / REMOVE PACEMAKER     INSERTION OF MESH Left 05/22/2020   Procedure: INSERTION OF MESH;  Surgeon: Abigail Miyamoto, MD;  Location: [facility] OR;  Service: General;  Laterality: Left;   IR ANGIOGRAM PELVIS SELECTIVE OR SUPRASELECTIVE  08/28/2022   IR ANGIOGRAM SELECTIVE EACH ADDITIONAL VESSEL  08/28/2022   IR ANGIOGRAM SELECTIVE EACH ADDITIONAL VESSEL  08/28/2022   IR ANGIOGRAM SELECTIVE EACH ADDITIONAL VESSEL  08/28/2022   IR EMBO TUMOR ORGAN ISCHEMIA INFARCT INC GUIDE ROADMAPPING  08/28/2022   IR US GUIDE VASC ACCESS LEFT  08/28/2022   IR US GUIDE VASC ACCESS LEFT  08/28/2022   IR US GUIDE VASC ACCESS LEFT  08/28/2022   IR US GUIDE VASC ACCESS RIGHT  08/28/2022   TEE WITHOUT CARDIOVERSION N/A 01/26/2015   Procedure: TRANSESOPHAGEAL ECHOCARDIOGRAM (TEE);  Surgeon: [name], MD;  Location[facility] ENDOSCOPY;  Service: Cardiovascular;  Laterality: N/A;   TONSILECTOMY, ADENOIDECTOMY, BILATERAL MYRINGOTOMY AND TUBES     TONSILLECTOMY     WISDOM TOOTH EXTRACTION     WRIST FRACTURE SURGERY Right 1960   3rd grade    ALLERGIES: No Known Allergies  FAMILY HISTORY:  Family History  Problem Relation Age of Onset   CAD Father    Other Mother        passed  right before 79- tumor on kidney recent diagnosis   Dementia Mother        progressing rapidly   CAD Paternal Grandfather        109s   CAD Paternal Grandmother        38s   Brain cancer Maternal Grandmother    Lymphoma Sister        b cell   Celiac disease Sister    Diabetes Neg Hx     SOCIAL HISTORY: Social History   Socioeconomic History   Marital status: Married    Spouse name: Not on file   Number of children: Not on file   Years of education: Not on file  Highest education level: Master's degree (e.g., MA, MS, MEng, MEd, MSW, MBA)  Occupational History   Occupation: Engineer, maintenance: [employer]  Tobacco Use   Smoking status: Former    Current packs/day: 0.00    Average packs/day: 1 pack/day for 30.0 years (30.0 ttl pk-yrs)    Types: Cigarettes    Start date: 05/04/1970    Quit date: 05/03/2000    Years since quitting: 22.6   Smokeless tobacco: Never  Vaping Use   Vaping status: Never Used  Substance and Sexual Activity   Alcohol use: Yes    Alcohol/week: 3.0 standard drinks of alcohol    Types: 1 Glasses of wine, 1 Cans of beer, 1 Shots of liquor per week    Comment: 1-2/month   Drug use: No   Sexual activity: Not on file  Other Topics Concern   Not on file  Social History Narrative   Married (wife seen elsewhere- still working in Catering manager 2024). 3 step children, 1 biological. No grandkids.       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      Hobbies: race sail boats, adrenaline related activities   Social Determinants of Health   Financial Resource Strain: Low Risk  (07/18/2022)   Overall Financial Resource Strain (CARDIA)    Difficulty of Paying Living Expenses: Not hard at all  Food Insecurity: No Food Insecurity (07/18/2022)   Hunger Vital Sign    Worried About Running Out of Food in the Last Year: Never true    Ran Out of Food in the Last Year: Never true  Transportation Needs: No  Transportation Needs (07/18/2022)   PRAPARE - Administrator, Civil Service (Medical): No    Lack of Transportation (Non-Medical): No  Physical Activity: Sufficiently Active (07/18/2022)   Exercise Vital Sign    Days of Exercise per Week: 5 days    Minutes of Exercise per Session: 60 min  Stress: No Stress Concern Present (07/18/2022)   [employer] of Occupational Health - Occupational Stress Questionnaire    Feeling of Stress : Not at all  Social Connections: Moderately Integrated (07/18/2022)   Social Connection and Isolation Panel [NHANES]    Frequency of Communication with Friends and Family: More than three times a week    Frequency of Social Gatherings with Friends and Family: Twice a week    Attends Religious Services: Never    Database administrator or Organizations: Yes    Attends Engineer, structural: More than 4 times per year    Marital Status: Married  Recent Concern: Social Connections - Moderately Isolated (07/04/2022)   Social Connection and Isolation Panel [NHANES]    Frequency of Communication with Friends and Family: Once a week    Frequency of Social Gatherings with Friends and Family: Once a week    Attends Religious Services: Never    Database administrator or Organizations: Yes    Attends Engineer, structural: 1 to 4 times per year    Marital Status: Married    MEDICATIONS:  Current Outpatient Medications  Medication Sig Dispense Refill   Blood Glucose Monitoring Suppl (ONETOUCH VERIO) w/Device KIT 1 Device by Does not apply route daily. 1 kit 0   carvedilol (COREG) 6.25 MG tablet TAKE 1 TABLET(6.25 MG) BY MOUTH TWICE DAILY 180 tablet 2   CINNAMON PO Take 1 tablet by mouth daily.     clopidogrel (PLAVIX) 75 MG tablet TAKE  1 TABLET(75 MG) BY MOUTH DAILY 90 tablet 3   Coenzyme Q10 (COQ10) 200 MG CAPS Take 200 mg by mouth in the morning.     Continuous Glucose Sensor (DEXCOM G7 SENSOR) MISC USE FOR CONTINUOUS BLOOD GLUCOSE  MONITORING. REPLACE EVERY 10 DAYS 3 each 3   dapagliflozin propanediol (FARXIGA) 10 MG TABS tablet Take 1 tablet (10mg ) by mouth daily before breakfast 90 tablet 0   ezetimibe (ZETIA) 10 MG tablet TAKE 1 TABLET(10 MG) BY MOUTH DAILY 90 tablet 3   GARLIC PO Take 1 tablet by mouth daily.     Glucagon (GVOKE HYPOPEN 2-PACK) 0.5 MG/0.1ML SOAJ Use for severe lows 0.2 mL 1   glucose blood (ONETOUCH VERIO) test strip 1 each by Other route 2 (two) times daily. And lancets 2/day 200 each 12   HUMALOG KWIKPEN 100 UNIT/ML KwikPen INJECT UNDER THE SKIN 7 UNITS EVERY MORNING, 3 UNITS AT LUNCH, AND 7 UNITS AT DINNER. 30 mL 0   insulin lispro (HUMALOG) 100 UNIT/ML injection Use 60 units per day via pump. 10 mL 11   Insulin Lispro Prot & Lispro (HUMALOG MIX 50/50 KWIKPEN) (50-50) 100 UNIT/ML Kwikpen Inject under the skin 7 units in every morning , 3 units at lunch and 7 units at dinner 30 mL 11   Insulin Pen Needle (PEN NEEDLES) 32G X 4 MM MISC Use to inject insulin 4 times a day 100 each 2   levothyroxine (SYNTHROID) 125 MCG tablet Take 1 tablet (125 mcg total) by mouth daily. 90 tablet 3   Probiotic Product (PROBIOTIC PO) Take 1 capsule by mouth every morning.     rosuvastatin (CRESTOR) 10 MG tablet Take 1 tablet (10 mg total) by mouth daily. 90 tablet 3   sacubitril-valsartan (ENTRESTO) 49-51 MG Take 1 tablet by mouth 2 (two) times daily. 180 tablet 3   tadalafil (CIALIS) 5 MG tablet TAKE 1 TABLET(5 MG) BY MOUTH DAILY 90 tablet 3   tamsulosin (FLOMAX) 0.4 MG CAPS capsule Take 0.4 mg by mouth daily.     TOUJEO SOLOSTAR 300 UNIT/ML Solostar Pen ADMINISTER 38 UNITS UNDER THE SKIN EVERY DAY 6 mL 3   No current facility-administered medications for this visit.    PHYSICAL EXAM: Vitals:   01/08/23 0821  BP: 100/60  Pulse: 60  Resp: 20  SpO2: 97%  Weight: 170 lb 9.6 oz (77.4 kg)  Height: 1' (0.305 m)    Body mass index is 832.95 kg/m.  Wt Readings from Last 3 Encounters:  01/08/23 170 lb 9.6 oz (77.4  kg)  11/11/22 168 lb (76.2 kg)  10/04/22 176 lb 6.4 oz (80 kg)    General: Well developed, well nourished male in no apparent distress.  HEENT: AT/[place], no external lesions.  Eyes: Conjunctiva clear and no icterus. Neck: Neck supple  Lungs: Respirations not labored Neurologic: Alert, oriented, normal speech Extremities / Skin: Dry. No sores or rashes noted. Pump site okay.  Psychiatric: Does not appear depressed or anxious  Diabetic Foot Exam - Simple   No data filed    LABS Reviewed Lab Results  Component Value Date   HGBA1C 8.0 (A) 01/08/2023   HGBA1C 7.2 (A) 09/03/2022   HGBA1C 7.0 (H) 05/13/2022   Lab Results  Component Value Date   FRUCTOSAMINE 319 (H) 06/18/2022   FRUCTOSAMINE 413 (H) 11/24/2015   Lab Results  Component Value Date   CHOL 157 05/01/2022   HDL 61 05/01/2022   LDLCALC 77 05/01/2022   LDLDIRECT 85.0 07/11/2017   TRIG  105 05/01/2022   CHOLHDL 2.6 05/01/2022   Lab Results  Component Value Date   MICRALBCREAT 1.6 07/08/2022   MICRALBCREAT 1.2 07/13/2021   Lab Results  Component Value Date   CREATININE 1.20 08/28/2022   Lab Results  Component Value Date   GFR 56.59 (L) 06/18/2022    Latest Reference Range & Units 10/04/22 10:35  IA-2 Antibody <5.4 U/mL 8.3 (H)  ZNT8 Antibodies <15 U/mL <10  Glutamic Acid Decarb Ab <5 IU/mL 19 (H)  (H): Data is abnormally high  ASSESSMENT / PLAN  1. Type 1 diabetes mellitus with retinopathy, macular edema presence unspecified, unspecified laterality, unspecified retinopathy severity (HCC)   2. Adult hypothyroidism   3. Primary hypothyroidism   4. Type 1 diabetes mellitus with complications (HCC)      Diabetes Mellitus type 12, complicated by diabetic retinopathy.  He had type I antibodies positive consistent with type 1 diabetes mellitus.  - Diabetic status / severity: Uncontrolled, worsening.  Lab Results  Component Value Date   HGBA1C 8.0 (A) 01/08/2023    - Hemoglobin A1c goal <7%   -  Medications:  Patient has not been bolusing for meals and the pump.  I advised to bolus for each meal from the pump using bolus wizard, demonstrated and instructed to wait to bolus from the pump.  Adjusted pump setting as follows  Insulin pump setting changed as follows:  Insulin Pump setting:  Farxiga 10 mg daily.  Tandem Mobi pump  Insulin Pump setting:  Basal MN- 0.500u/hour 12PM- 0.600  6PM- 0.500  Bolus CHO Ratio (1unit:CHO) MN- 1:17, changed to 1:15  Correction/Sensitivity: MN- 1:50  Target: 110   Active insulin time: 5 hours  Discussed about using exercise mode in the pump when exercising.  Okay to use sleep or during night.  - Home glucose testing: continue CGM and check blood glucose as needed.  - Discussed/ Gave Hypoglycemia treatment plan.  # Consult : not required at this time.   # Annual urine for microalbuminuria/ creatinine ratio, no microalbuminuria currently. Last  Lab Results  Component Value Date   MICRALBCREAT 1.6 07/08/2022    # Foot check nightly / neuropathy.  # Annual dilated diabetic eye exams.   - Diet: Make healthy diabetic food choices - Life style / activity / exercise: discussed.  2. Blood pressure  -  BP Readings from Last 1 Encounters:  01/08/23 100/60    - Control is in target.  - No change in current plans.  3. Lipid status / Hyperlipidemia - Last  Lab Results  Component Value Date   LDLCALC 77 05/01/2022   - Continue rosuvastatin 20 mg daily and Zetia 10 mg daily, managed by cardiology.    # Primary hypothyroidism -Currently taking levothyroxine 125 mcg daily.  TSH was normal 3.82 in June 2024.  Will continue the current dose.  Diagnoses and all orders for this visit:  Type 1 diabetes mellitus with retinopathy, macular edema presence unspecified, unspecified laterality, unspecified retinopathy severity (HCC) -     POCT glycosylated hemoglobin (Hb A1C)  Adult hypothyroidism  Primary hypothyroidism  Type 1  diabetes mellitus with complications (HCC)    DISPOSITION Follow up in clinic in 3 months suggested.   All questions answered and patient verbalized understanding of the plan.  John Kandise Riehle, MD [facility] [address] [place], [address] [place], [place] [address] Phone # [phone]  At least part of this note was generated using voice recognition software. Inadvertent word  errors may have occurred, which were not recognized during the proofreading process.

## 2023-01-10 ENCOUNTER — Other Ambulatory Visit: Payer: Self-pay | Admitting: *Deleted

## 2023-01-10 ENCOUNTER — Other Ambulatory Visit: Payer: Self-pay | Admitting: Cardiovascular Disease

## 2023-01-10 MED ORDER — CLOPIDOGREL BISULFATE 75 MG PO TABS: 75.0000 mg | ORAL_TABLET | Freq: Every day | ORAL | 0 refills | Status: DC

## 2023-01-14 ENCOUNTER — Encounter: Payer: Self-pay | Admitting: Family Medicine

## 2023-01-21 DIAGNOSIS — R3914 Feeling of incomplete bladder emptying: Secondary | ICD-10-CM | POA: Diagnosis not present

## 2023-01-24 DIAGNOSIS — H3563 Retinal hemorrhage, bilateral: Secondary | ICD-10-CM | POA: Diagnosis not present

## 2023-01-24 LAB — HM DIABETES EYE EXAM

## 2023-01-27 NOTE — Addendum Note (Signed)
Encounter addended by: Arby Barrette on: 01/27/2023 10:57 AM  Actions taken: Imaging Exam ended

## 2023-02-10 ENCOUNTER — Other Ambulatory Visit: Payer: Self-pay | Admitting: Cardiovascular Disease

## 2023-03-11 ENCOUNTER — Other Ambulatory Visit: Payer: Self-pay

## 2023-03-11 DIAGNOSIS — E10319 Type 1 diabetes mellitus with unspecified diabetic retinopathy without macular edema: Secondary | ICD-10-CM

## 2023-03-11 MED ORDER — INSULIN LISPRO 100 UNIT/ML IJ SOLN: INTRAMUSCULAR | 11 refills | Status: DC

## 2023-03-18 ENCOUNTER — Telehealth: Payer: Self-pay

## 2023-03-18 NOTE — Telephone Encounter (Signed)
Pharmacy sent over fax with message stating Dexcom G7 was on back order until 05/04/23. Patient called to question which pharmacy he preferred, patient to call back.

## 2023-03-21 ENCOUNTER — Other Ambulatory Visit: Payer: Self-pay

## 2023-03-21 DIAGNOSIS — E1165 Type 2 diabetes mellitus with hyperglycemia: Secondary | ICD-10-CM

## 2023-03-21 MED ORDER — DAPAGLIFLOZIN PROPANEDIOL 10 MG PO TABS: ORAL_TABLET | ORAL | 0 refills | Status: DC

## 2023-03-24 ENCOUNTER — Other Ambulatory Visit: Payer: Self-pay

## 2023-04-08 ENCOUNTER — Encounter: Payer: Self-pay | Admitting: Endocrinology

## 2023-04-08 ENCOUNTER — Ambulatory Visit: Payer: Medicare Other | Admitting: Endocrinology

## 2023-04-08 VITALS — BP 108/64 | HR 64 | Ht 69.0 in | Wt 173.0 lb

## 2023-04-08 DIAGNOSIS — E782 Mixed hyperlipidemia: Secondary | ICD-10-CM

## 2023-04-08 DIAGNOSIS — E10319 Type 1 diabetes mellitus with unspecified diabetic retinopathy without macular edema: Secondary | ICD-10-CM

## 2023-04-08 DIAGNOSIS — E039 Hypothyroidism, unspecified: Secondary | ICD-10-CM | POA: Diagnosis not present

## 2023-04-08 LAB — POCT GLYCOSYLATED HEMOGLOBIN (HGB A1C): Hemoglobin A1C: 7.9 % — AB (ref 4.0–5.6)

## 2023-04-08 NOTE — Progress Notes (Signed)
 Outpatient Endocrinology Note John Riham Polyakov, MD  04/08/23  Patient's Name: John Carrillo    DOB: [birth year]    MRN: [id number]                                                    REASON OF VISIT: Follow up for type 1 diabetes mellitus  PCP: [name], MD  HISTORY OF PRESENT ILLNESS:   John Carrillo is a 73 y.o. old male with past medical history listed below, is here for follow up of type 1 diabetes mellitus.    Pertinent Diabetes History: Patient was diagnosed with type 2 diabetes mellitus in 2008.  Insulin therapy was started soon after the diagnosis.  Prior to insulin pump therapy used to be on basal bolus regimen with glargine and Humalog.  Not clear whether he was treated with metformin previously.  He had positive IA 2 and GAD 65 antibody positive consistent with type 1 diabetes mellitus in August 2024.  Patient was restarted on insulin pump therapy Tandem mobi pump from end of July 2024.  Chronic Diabetes Complications : Retinopathy: Mild nonproliferative retinopathy, annually in 01/2023.  Following with ophthalmology. Nephropathy: no Peripheral neuropathy: no Coronary artery disease: yes Stroke: yes  Relevant comorbidities and cardiovascular risk factors: Obesity: no Body mass index is 25.55 kg/m.  Hypertension: yes Hyperlipidemia. Yes, on a statin.  Current / Home Diabetic regimen includes:  Farxiga 10 mg daily.  Tandem Mobi pump using Humalog U100  Insulin Pump setting:  Basal MN- 0.500u/hour 12PM- 0.600  6PM- 0.500  Bolus CHO Ratio (1unit:CHO) MN- 1:15  Correction/Sensitivity: MN- 1:50  Target: 110   Active insulin time: 5 hours  Prior diabetic medications: Ozempic stopped due to nausea.  CONTINUOUS GLUCOSE MONITORING SYSTEM (CGMS) / INSULIN PUMP INTERPRETATION:                         Tandem Mobi Pump & Sensor Download (Reviewed and summarized below.) Pump: Dexcom G7 and Tandem t:slim /Mobi Dates: December 25 to February 11, 2023  Glucose Management Indicator: 7.6%    Average total daily insulin:  33 units, Basal: 50%, Bolus: 50%,  (Manual Bolus: 35%,  Control IQ Bolus: 65%)    Trends:  Frequent hyperglycemia postprandially with blood sugar up to 200-230 and occasionally up to 280 related to no meal bolus.  He has been using meal bolus rarely with carb count.  He has been manually bolusing for meals sometime before eating and sometime during or after eating.  One occasion with persistent hyperglycemia at the time of site change.  He had occasionally used sleep mode during the daytime preventing auto boluses.  Most of the time acceptable blood sugar overnight and in between the meals.  GMI 7.5%.  No hypoglycemia.  Time in range 60%.  Hypoglycemia: Patient has no hypoglycemic episodes. Patient has hypoglycemia awareness.    Factors modifying glucose control: 1.  Diabetic diet assessment: 3 meals a day.  2.  Staying active or exercising: Exercising at gym.  3.  Medication compliance: compliant all of the time.  # Hypothyroidism : He was diagnosed with hypothyroidism in 2008 and has been on thyroid hormone replacement since the diagnosis.  He is currently taking levothyroxine 125 mcg daily.   Interval history  Pump and CGM data as  reviewed above.  Download data as noted above.  Not able to download last 2 weeks data, he is mobile app is not updated.  Patient will be contacting tandem and updating Into his phone.  Mostly postprandial hyperglycemia.  No other complaints today.  Hemoglobin A1c 7.9% today.  He has been taking levothyroxine 125 mcg daily, no hypo and hyperthyroid symptoms.  REVIEW OF SYSTEMS As per history of present illness.   PAST MEDICAL HISTORY: Past Medical History:  Diagnosis Date   Acute kidney injury superimposed on chronic kidney disease (HCC) 01/25/2015   AICD (automatic cardioverter/defibrillator) present    CHF (congestive heart failure) (HCC)    CORONARY ARTERY DISEASE  08/11/2006   CVA 12/19/2006   2008   DIABETES MELLITUS, TYPE I 08/11/2006   ECZEMA, HANDS 12/15/2009   History of kidney stones    10+ years ago   HYPERLIPIDEMIA 08/11/2006   HYPERTENSION 08/11/2006   Hypothyroidism    Myocardial infarction [facility])    Presence of permanent cardiac pacemaker    Type 2 diabetes mellitus without complications (HCC) 12/20/2016    PAST SURGICAL HISTORY: Past Surgical History:  Procedure Laterality Date   CARDIAC CATHETERIZATION N/A 01/16/2015   Procedure: Left Heart Cath and Coronary Angiography;  Surgeon: [name], MD;  Location: [facility] INVASIVE CV LAB;  Service: Cardiovascular;  Laterality: N/A;   CORONARY STENT PLACEMENT     EP IMPLANTABLE DEVICE N/A 01/10/2016   MDT [place] MRI Quad CRTD implanted by Dr [name]   INGUINAL HERNIA REPAIR Left 05/22/2020   Procedure: LEFT INGUINAL HERNIA REPAIR WITH MESH;  Surgeon: Abigail Miyamoto, MD;  Location: [facility] OR;  Service: General;  Laterality: Left;   INSERT / REPLACE / REMOVE PACEMAKER     INSERTION OF MESH Left 05/22/2020   Procedure: INSERTION OF MESH;  Surgeon: Abigail Miyamoto, MD;  Location: [facility] OR;  Service: General;  Laterality: Left;   IR ANGIOGRAM PELVIS SELECTIVE OR SUPRASELECTIVE  08/28/2022   IR ANGIOGRAM SELECTIVE EACH ADDITIONAL VESSEL  08/28/2022   IR ANGIOGRAM SELECTIVE EACH ADDITIONAL VESSEL  08/28/2022   IR ANGIOGRAM SELECTIVE EACH ADDITIONAL VESSEL  08/28/2022   IR EMBO TUMOR ORGAN ISCHEMIA INFARCT INC GUIDE ROADMAPPING  08/28/2022   IR US GUIDE VASC ACCESS LEFT  08/28/2022   IR US GUIDE VASC ACCESS LEFT  08/28/2022   IR US GUIDE VASC ACCESS LEFT  08/28/2022   IR US GUIDE VASC ACCESS RIGHT  08/28/2022   TEE WITHOUT CARDIOVERSION N/A 01/26/2015   Procedure: TRANSESOPHAGEAL ECHOCARDIOGRAM (TEE);  Surgeon: [name], MD;  Location: [facility] ENDOSCOPY;  Service: Cardiovascular;  Laterality: N/A;   TONSILECTOMY, ADENOIDECTOMY, BILATERAL MYRINGOTOMY AND TUBES     TONSILLECTOMY     WISDOM TOOTH EXTRACTION      WRIST FRACTURE SURGERY Right 1960   3rd grade    ALLERGIES: No Known Allergies  FAMILY HISTORY:  Family History  Problem Relation Age of Onset   CAD Father    Other Mother        passed right before 79- tumor on kidney recent diagnosis   Dementia Mother        progressing rapidly   CAD Paternal Grandfather        70s   CAD Paternal Grandmother        85s   Brain cancer Maternal Grandmother    Lymphoma Sister        b cell   Celiac disease Sister    Diabetes Neg Hx     SOCIAL HISTORY:  Social History   Socioeconomic History   Marital status: Married    Spouse name: Not on file   Number of children: Not on file   Years of education: Not on file   Highest education level: Master's degree (e.g., MA, MS, MEng, MEd, MSW, MBA)  Occupational History   Occupation: Engineer, maintenance: [employer]  Tobacco Use   Smoking status: Former    Current packs/day: 0.00    Average packs/day: 1 pack/day for 30.0 years (30.0 ttl pk-yrs)    Types: Cigarettes    Start date: 05/04/1970    Quit date: 05/03/2000    Years since quitting: 22.9   Smokeless tobacco: Never  Vaping Use   Vaping status: Never Used  Substance and Sexual Activity   Alcohol use: Yes    Alcohol/week: 3.0 standard drinks of alcohol    Types: 1 Glasses of wine, 1 Cans of beer, 1 Shots of liquor per week    Comment: 1-2/month   Drug use: No   Sexual activity: Not on file  Other Topics Concern   Not on file  Social History Narrative   Married (wife seen elsewhere- still working in Catering manager 2024). 3 step children, 1 biological. No grandkids.       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      Hobbies: race sail boats, adrenaline related activities   Social Drivers of Corporate investment banker Strain: Low Risk  (07/18/2022)   Overall Financial Resource Strain (CARDIA)    Difficulty of Paying Living Expenses: Not hard at all  Food Insecurity: No Food  Insecurity (07/18/2022)   Hunger Vital Sign    Worried About Running Out of Food in the Last Year: Never true    Ran Out of Food in the Last Year: Never true  Transportation Needs: No Transportation Needs (07/18/2022)   PRAPARE - Administrator, Civil Service (Medical): No    Lack of Transportation (Non-Medical): No  Physical Activity: Sufficiently Active (07/18/2022)   Exercise Vital Sign    Days of Exercise per Week: 5 days    Minutes of Exercise per Session: 60 min  Stress: No Stress Concern Present (07/18/2022)   [employer] of Occupational Health - Occupational Stress Questionnaire    Feeling of Stress : Not at all  Social Connections: Moderately Integrated (07/18/2022)   Social Connection and Isolation Panel [NHANES]    Frequency of Communication with Friends and Family: More than three times a week    Frequency of Social Gatherings with Friends and Family: Twice a week    Attends Religious Services: Never    Database administrator or Organizations: Yes    Attends Engineer, structural: More than 4 times per year    Marital Status: Married  Recent Concern: Social Connections - Moderately Isolated (07/04/2022)   Social Connection and Isolation Panel [NHANES]    Frequency of Communication with Friends and Family: Once a week    Frequency of Social Gatherings with Friends and Family: Once a week    Attends Religious Services: Never    Database administrator or Organizations: Yes    Attends Engineer, structural: 1 to 4 times per year    Marital Status: Married    MEDICATIONS:  Current Outpatient Medications  Medication Sig Dispense Refill   Blood Glucose Monitoring Suppl (ONETOUCH VERIO) w/Device KIT 1 Device by Does not apply route daily. 1 kit 0  carvedilol (COREG) 6.25 MG tablet TAKE 1 TABLET(6.25 MG) BY MOUTH TWICE DAILY 180 tablet 2   CINNAMON PO Take 1 tablet by mouth daily.     clopidogrel (PLAVIX) 75 MG tablet Take 1 tablet (75 mg total)  by mouth daily. 90 tablet 2   Coenzyme Q10 (COQ10) 200 MG CAPS Take 200 mg by mouth in the morning.     Continuous Glucose Sensor (DEXCOM G7 SENSOR) MISC USE FOR CONTINUOUS BLOOD GLUCOSE MONITORING. REPLACE EVERY 10 DAYS 3 each 3   dapagliflozin propanediol (FARXIGA) 10 MG TABS tablet Take 1 tablet (10mg ) by mouth daily before breakfast 90 tablet 0   ezetimibe (ZETIA) 10 MG tablet TAKE 1 TABLET(10 MG) BY MOUTH DAILY 90 tablet 3   GARLIC PO Take 1 tablet by mouth daily.     Glucagon (GVOKE HYPOPEN 2-PACK) 0.5 MG/0.1ML SOAJ Use for severe lows 0.2 mL 1   glucose blood (ONETOUCH VERIO) test strip 1 each by Other route 2 (two) times daily. And lancets 2/day 200 each 12   HUMALOG KWIKPEN 100 UNIT/ML KwikPen INJECT UNDER THE SKIN 7 UNITS EVERY MORNING, 3 UNITS AT LUNCH, AND 7 UNITS AT DINNER. 30 mL 0   insulin lispro (HUMALOG) 100 UNIT/ML injection Use 60 units per day via pump. 10 mL 11   Insulin Lispro Prot & Lispro (HUMALOG MIX 50/50 KWIKPEN) (50-50) 100 UNIT/ML Kwikpen Inject under the skin 7 units in every morning , 3 units at lunch and 7 units at dinner 30 mL 11   Insulin Pen Needle (PEN NEEDLES) 32G X 4 MM MISC Use to inject insulin 4 times a day 100 each 2   levothyroxine (SYNTHROID) 125 MCG tablet Take 1 tablet (125 mcg total) by mouth daily. 90 tablet 3   Probiotic Product (PROBIOTIC PO) Take 1 capsule by mouth every morning.     rosuvastatin (CRESTOR) 10 MG tablet Take 1 tablet (10 mg total) by mouth daily. 90 tablet 3   sacubitril-valsartan (ENTRESTO) 49-51 MG Take 1 tablet by mouth 2 (two) times daily. 180 tablet 3   tadalafil (CIALIS) 5 MG tablet TAKE 1 TABLET(5 MG) BY MOUTH DAILY 90 tablet 3   TOUJEO SOLOSTAR 300 UNIT/ML Solostar Pen ADMINISTER 38 UNITS UNDER THE SKIN EVERY DAY 6 mL 3   tamsulosin (FLOMAX) 0.4 MG CAPS capsule Take 0.4 mg by mouth daily.     No current facility-administered medications for this visit.    PHYSICAL EXAM: Vitals:   04/08/23 0821  BP: 108/64  Pulse: 64   SpO2: 98%  Weight: 173 lb (78.5 kg)  Height: 5\' 9"  (1.753 m)     Body mass index is 25.55 kg/m.  Wt Readings from Last 3 Encounters:  04/08/23 173 lb (78.5 kg)  01/08/23 170 lb 9.6 oz (77.4 kg)  11/11/22 168 lb (76.2 kg)    General: Well developed, well nourished male in no apparent distress.  HEENT: AT/[place], no external lesions.  Eyes: Conjunctiva clear and no icterus. Neck: Neck supple  Lungs: Respirations not labored Neurologic: Alert, oriented, normal speech Extremities / Skin: Dry. No sores or rashes noted. Pump site okay.  Psychiatric: Does not appear depressed or anxious  Diabetic Foot Exam - Simple   Simple Foot Form Diabetic Foot exam was performed with the following findings: Yes 04/08/2023  8:34 AM  Visual Inspection No deformities, no ulcerations, no other skin breakdown bilaterally: Yes Sensation Testing Intact to touch and monofilament testing bilaterally: Yes Pulse Check Posterior Tibialis and Dorsalis pulse intact bilaterally: Yes  Comments    LABS Reviewed Lab Results  Component Value Date   HGBA1C 7.9 (A) 04/08/2023   HGBA1C 8.0 (A) 01/08/2023   HGBA1C 7.2 (A) 09/03/2022   Lab Results  Component Value Date   FRUCTOSAMINE 319 (H) 06/18/2022   FRUCTOSAMINE 413 (H) 11/24/2015   Lab Results  Component Value Date   CHOL 157 05/01/2022   HDL 61 05/01/2022   LDLCALC 77 05/01/2022   LDLDIRECT 85.0 07/11/2017   TRIG 105 05/01/2022   CHOLHDL 2.6 05/01/2022   Lab Results  Component Value Date   MICRALBCREAT 1.6 07/08/2022   MICRALBCREAT 1.2 07/13/2021   Lab Results  Component Value Date   CREATININE 1.20 08/28/2022   Lab Results  Component Value Date   GFR 56.59 (L) 06/18/2022    Latest Reference Range & Units 10/04/22 10:35  IA-2 Antibody <5.4 U/mL 8.3 (H)  ZNT8 Antibodies <15 U/mL <10  Glutamic Acid Decarb Ab <5 IU/mL 19 (H)  (H): Data is abnormally high  ASSESSMENT / PLAN  1. Type 1 diabetes mellitus with retinopathy, macular edema  presence unspecified, unspecified laterality, unspecified retinopathy severity (HCC)   2. Primary hypothyroidism   3. Mixed hyperlipidemia     Diabetes Mellitus type 1, complicated by diabetic retinopathy.  He had type I antibodies positive consistent with type 1 diabetes mellitus.  - Diabetic status / severity: Uncontrolled, worsening.  Lab Results  Component Value Date   HGBA1C 7.9 (A) 04/08/2023    - Hemoglobin A1c goal <7%   - Medications:   Patient has not been bolusing for meals and the pump mostly.  I advised to bolus for each meal from the pump using bolus wizard, demonstrated and instructed to wait to bolus from the pump.  Advised to bolus 10 to 15 minutes before eating.  Adjusted pump setting as follows  Insulin pump setting changed as follows:  Insulin Pump setting:  Farxiga 10 mg daily.  Tandem Mobi pump  Insulin Pump setting:  Basal MN- 0.500u/hour 12PM- 0.600  6PM- 0.500  Bolus CHO Ratio (1unit:CHO) MN- 1:15, changed to 1:12  Correction/Sensitivity: MN- 1:50, changed to 1 : 45  Target: 110   Active insulin time: 5 hours  Discussed about using exercise mode in the pump when exercising.  Okay to use sleep or during night.  - Home glucose testing: continue CGM and check blood glucose as needed.  - Discussed/ Gave Hypoglycemia treatment plan.  Patient asked to call in 2 weeks to remotely download and review pump and CGM data.  # Consult : not required at this time.   # Annual urine for microalbuminuria/ creatinine ratio, no microalbuminuria currently. Last  Lab Results  Component Value Date   MICRALBCREAT 1.6 07/08/2022    # Foot check nightly / neuropathy.  # Annual dilated diabetic eye exams.   - Diet: Make healthy diabetic food choices - Life style / activity / exercise: discussed.  2. Blood pressure  -  BP Readings from Last 1 Encounters:  04/08/23 108/64    - Control is in target.  - No change in current plans.  3. Lipid  status / Hyperlipidemia - Last  Lab Results  Component Value Date   LDLCALC 77 05/01/2022   - Continue rosuvastatin 20 mg daily and Zetia 10 mg daily, managed by cardiology.    # Primary hypothyroidism -Currently taking levothyroxine 125 mcg daily.   John "Brett Canales" was seen today for follow-up.  Diagnoses and all orders for this visit:  Type 1  diabetes mellitus with retinopathy, macular edema presence unspecified, unspecified laterality, unspecified retinopathy severity (HCC) -     POCT glycosylated hemoglobin (Hb A1C) -     BASIC METABOLIC PANEL WITH GFR -     Microalbumin / creatinine urine ratio  Primary hypothyroidism -     T4, free -     TSH  Mixed hyperlipidemia -     Lipid panel     DISPOSITION Follow up in clinic in 3 months suggested.  Labs prior to visit as ordered.   All questions answered and patient verbalized understanding of the plan.  John Brailen Macneal, MD [facility] Endocrinology [facility] [address] [place], [address] [place], [place] [address] Phone # [phone]  At least part of this note was generated using voice recognition software. Inadvertent word errors may have occurred, which were not recognized during the proofreading process.

## 2023-04-09 ENCOUNTER — Ambulatory Visit (INDEPENDENT_AMBULATORY_CARE_PROVIDER_SITE_OTHER): Payer: Medicare Other

## 2023-04-09 ENCOUNTER — Encounter: Payer: Self-pay | Admitting: Endocrinology

## 2023-04-09 DIAGNOSIS — I472 Ventricular tachycardia, unspecified: Secondary | ICD-10-CM

## 2023-04-12 LAB — CUP PACEART REMOTE DEVICE CHECK
Battery Remaining Longevity: 23 mo
Battery Voltage: 2.92 V
Brady Statistic AP VP Percent: 53.93 %
Brady Statistic AP VS Percent: 0.45 %
Brady Statistic AS VP Percent: 44.93 %
Brady Statistic AS VS Percent: 0.69 %
Brady Statistic RA Percent Paced: 53.75 %
Brady Statistic RV Percent Paced: 62.69 %
Date Time Interrogation Session: 20250305031705
HighPow Impedance: 64 Ohm
Implantable Lead Connection Status: 753985
Implantable Lead Connection Status: 753985
Implantable Lead Connection Status: 753985
Implantable Lead Implant Date: 20171206
Implantable Lead Implant Date: 20171206
Implantable Lead Implant Date: 20171206
Implantable Lead Location: 753858
Implantable Lead Location: 753859
Implantable Lead Location: 753860
Implantable Lead Model: 4598
Implantable Lead Model: 5076
Implantable Pulse Generator Implant Date: 20171206
Lead Channel Impedance Value: 199.5 Ohm
Lead Channel Impedance Value: 208.568
Lead Channel Impedance Value: 220.723
Lead Channel Impedance Value: 220.723
Lead Channel Impedance Value: 231.878
Lead Channel Impedance Value: 342 Ohm
Lead Channel Impedance Value: 399 Ohm
Lead Channel Impedance Value: 399 Ohm
Lead Channel Impedance Value: 399 Ohm
Lead Channel Impedance Value: 437 Ohm
Lead Channel Impedance Value: 437 Ohm
Lead Channel Impedance Value: 494 Ohm
Lead Channel Impedance Value: 494 Ohm
Lead Channel Impedance Value: 665 Ohm
Lead Channel Impedance Value: 722 Ohm
Lead Channel Impedance Value: 760 Ohm
Lead Channel Impedance Value: 779 Ohm
Lead Channel Impedance Value: 779 Ohm
Lead Channel Pacing Threshold Amplitude: 0.5 V
Lead Channel Pacing Threshold Amplitude: 1.125 V
Lead Channel Pacing Threshold Amplitude: 1.25 V
Lead Channel Pacing Threshold Pulse Width: 0.4 ms
Lead Channel Pacing Threshold Pulse Width: 0.4 ms
Lead Channel Pacing Threshold Pulse Width: 0.4 ms
Lead Channel Sensing Intrinsic Amplitude: 15.625 mV
Lead Channel Sensing Intrinsic Amplitude: 15.625 mV
Lead Channel Sensing Intrinsic Amplitude: 3.25 mV
Lead Channel Sensing Intrinsic Amplitude: 3.25 mV
Lead Channel Setting Pacing Amplitude: 1.75 V
Lead Channel Setting Pacing Amplitude: 2 V
Lead Channel Setting Pacing Amplitude: 2.5 V
Lead Channel Setting Pacing Pulse Width: 0.4 ms
Lead Channel Setting Pacing Pulse Width: 0.4 ms
Lead Channel Setting Sensing Sensitivity: 0.3 mV
Zone Setting Status: 755011
Zone Setting Status: 755011

## 2023-04-17 ENCOUNTER — Encounter: Payer: Self-pay | Admitting: Cardiology

## 2023-04-28 ENCOUNTER — Other Ambulatory Visit: Payer: Self-pay

## 2023-04-28 DIAGNOSIS — E039 Hypothyroidism, unspecified: Secondary | ICD-10-CM

## 2023-04-28 MED ORDER — LEVOTHYROXINE SODIUM 125 MCG PO TABS: 125.0000 ug | ORAL_TABLET | Freq: Every day | ORAL | 3 refills | Status: AC

## 2023-05-01 ENCOUNTER — Other Ambulatory Visit: Payer: Self-pay

## 2023-05-01 MED ORDER — DEXCOM G7 SENSOR MISC: 3 refills | Status: DC

## 2023-05-13 ENCOUNTER — Other Ambulatory Visit: Payer: Self-pay | Admitting: Family Medicine

## 2023-05-22 NOTE — Addendum Note (Signed)
 Addended by: Lott Rouleau A on: 05/22/2023 01:42 PM   Modules accepted: Orders

## 2023-05-22 NOTE — Progress Notes (Signed)
 Remote ICD transmission.

## 2023-06-16 ENCOUNTER — Other Ambulatory Visit: Payer: Self-pay

## 2023-06-16 DIAGNOSIS — Z794 Long term (current) use of insulin: Secondary | ICD-10-CM

## 2023-06-16 MED ORDER — DAPAGLIFLOZIN PROPANEDIOL 10 MG PO TABS: ORAL_TABLET | ORAL | 0 refills | Status: DC

## 2023-07-09 ENCOUNTER — Ambulatory Visit (INDEPENDENT_AMBULATORY_CARE_PROVIDER_SITE_OTHER): Payer: Medicare Other

## 2023-07-09 DIAGNOSIS — I472 Ventricular tachycardia, unspecified: Secondary | ICD-10-CM

## 2023-07-09 LAB — CUP PACEART REMOTE DEVICE CHECK
Battery Remaining Longevity: 21 mo
Battery Voltage: 2.91 V
Brady Statistic AP VP Percent: 53.37 %
Brady Statistic AP VS Percent: 0.34 %
Brady Statistic AS VP Percent: 45.63 %
Brady Statistic AS VS Percent: 0.66 %
Brady Statistic RA Percent Paced: 53.06 %
Brady Statistic RV Percent Paced: 64.15 %
Date Time Interrogation Session: 20250604031805
HighPow Impedance: 67 Ohm
Implantable Lead Connection Status: 753985
Implantable Lead Connection Status: 753985
Implantable Lead Connection Status: 753985
Implantable Lead Implant Date: 20171206
Implantable Lead Implant Date: 20171206
Implantable Lead Implant Date: 20171206
Implantable Lead Location: 753858
Implantable Lead Location: 753859
Implantable Lead Location: 753860
Implantable Lead Model: 4598
Implantable Lead Model: 5076
Implantable Pulse Generator Implant Date: 20171206
Lead Channel Impedance Value: 194.634
Lead Channel Impedance Value: 203.256
Lead Channel Impedance Value: 203.256
Lead Channel Impedance Value: 208.568
Lead Channel Impedance Value: 218.5 Ohm
Lead Channel Impedance Value: 323 Ohm
Lead Channel Impedance Value: 380 Ohm
Lead Channel Impedance Value: 380 Ohm
Lead Channel Impedance Value: 380 Ohm
Lead Channel Impedance Value: 399 Ohm
Lead Channel Impedance Value: 437 Ohm
Lead Channel Impedance Value: 437 Ohm
Lead Channel Impedance Value: 513 Ohm
Lead Channel Impedance Value: 665 Ohm
Lead Channel Impedance Value: 722 Ohm
Lead Channel Impedance Value: 722 Ohm
Lead Channel Impedance Value: 722 Ohm
Lead Channel Impedance Value: 760 Ohm
Lead Channel Pacing Threshold Amplitude: 0.5 V
Lead Channel Pacing Threshold Amplitude: 1 V
Lead Channel Pacing Threshold Amplitude: 1.25 V
Lead Channel Pacing Threshold Pulse Width: 0.4 ms
Lead Channel Pacing Threshold Pulse Width: 0.4 ms
Lead Channel Pacing Threshold Pulse Width: 0.4 ms
Lead Channel Sensing Intrinsic Amplitude: 16.125 mV
Lead Channel Sensing Intrinsic Amplitude: 16.125 mV
Lead Channel Sensing Intrinsic Amplitude: 3.375 mV
Lead Channel Sensing Intrinsic Amplitude: 3.375 mV
Lead Channel Setting Pacing Amplitude: 2 V
Lead Channel Setting Pacing Amplitude: 2 V
Lead Channel Setting Pacing Amplitude: 2.5 V
Lead Channel Setting Pacing Pulse Width: 0.4 ms
Lead Channel Setting Pacing Pulse Width: 0.4 ms
Lead Channel Setting Sensing Sensitivity: 0.3 mV
Zone Setting Status: 755011
Zone Setting Status: 755011

## 2023-07-10 ENCOUNTER — Encounter: Payer: Medicare Other | Admitting: Family Medicine

## 2023-07-12 ENCOUNTER — Ambulatory Visit: Payer: Self-pay | Admitting: Cardiology

## 2023-07-14 ENCOUNTER — Other Ambulatory Visit

## 2023-07-14 DIAGNOSIS — E782 Mixed hyperlipidemia: Secondary | ICD-10-CM | POA: Diagnosis not present

## 2023-07-14 DIAGNOSIS — E039 Hypothyroidism, unspecified: Secondary | ICD-10-CM | POA: Diagnosis not present

## 2023-07-14 DIAGNOSIS — E10319 Type 1 diabetes mellitus with unspecified diabetic retinopathy without macular edema: Secondary | ICD-10-CM | POA: Diagnosis not present

## 2023-07-15 ENCOUNTER — Ambulatory Visit: Payer: Self-pay | Admitting: Endocrinology

## 2023-07-15 LAB — MICROALBUMIN / CREATININE URINE RATIO
Creatinine, Urine: 28 mg/dL (ref 20–320)
Microalb, Ur: <0.2 mg/dL

## 2023-07-15 LAB — LIPID PANEL
Cholesterol: 136 mg/dL (ref <200)
HDL: 71 mg/dL (ref >=40)
LDL Cholesterol (Calc): 50 mg/dL
Non-HDL Cholesterol (Calc): 65 mg/dL (ref <130)
Total CHOL/HDL Ratio: 1.9 (calc) (ref <5.0)
Triglycerides: 70 mg/dL (ref <150)

## 2023-07-15 LAB — BASIC METABOLIC PANEL WITH GFR
BUN: 21 mg/dL (ref 7–25)
CO2: 27 mmol/L (ref 20–32)
Calcium: 9 mg/dL (ref 8.6–10.3)
Chloride: 106 mmol/L (ref 98–110)
Creat: 1.2 mg/dL (ref 0.70–1.28)
Glucose, Bld: 157 mg/dL — ABNORMAL HIGH (ref 65–99)
Potassium: 4.8 mmol/L (ref 3.5–5.3)
Sodium: 142 mmol/L (ref 135–146)
eGFR: 65 mL/min/{1.73_m2} (ref >=60)

## 2023-07-15 LAB — T4, FREE: Free T4: 1.5 ng/dL (ref 0.8–1.8)

## 2023-07-15 LAB — TSH: TSH: 1.51 m[IU]/L (ref 0.40–4.50)

## 2023-07-16 ENCOUNTER — Ambulatory Visit: Payer: Self-pay | Admitting: Endocrinology

## 2023-07-16 ENCOUNTER — Encounter: Payer: Self-pay | Admitting: Endocrinology

## 2023-07-16 ENCOUNTER — Ambulatory Visit: Admitting: Endocrinology

## 2023-07-16 VITALS — BP 122/80 | HR 60 | Resp 20 | Ht 69.0 in | Wt 171.8 lb

## 2023-07-16 DIAGNOSIS — E782 Mixed hyperlipidemia: Secondary | ICD-10-CM

## 2023-07-16 DIAGNOSIS — E039 Hypothyroidism, unspecified: Secondary | ICD-10-CM

## 2023-07-16 DIAGNOSIS — E10319 Type 1 diabetes mellitus with unspecified diabetic retinopathy without macular edema: Secondary | ICD-10-CM

## 2023-07-16 LAB — POCT GLYCOSYLATED HEMOGLOBIN (HGB A1C): Hemoglobin A1C: 7.2 % — AB (ref 4.0–5.6)

## 2023-07-16 NOTE — Progress Notes (Signed)
 Outpatient Endocrinology Note [place] Kea Callan, MD  07/16/23  Patient's Name: John Carrillo    DOB: 1950/06/22    MRN: [id number]                                                    REASON OF VISIT: Follow up for type 1 diabetes mellitus  PCP: Almira Jaeger, MD  HISTORY OF PRESENT ILLNESS:   QUANTEZ SCHNYDER is a 73 y.o. old male with past medical history listed below, is here for follow up of type 1 diabetes mellitus.    Pertinent Diabetes History: Patient was diagnosed with type 2 diabetes mellitus in 2008.  Insulin  therapy was started soon after the diagnosis.  Prior to insulin  pump therapy used to be on basal bolus regimen with glargine and Humalog .  Not clear whether he was treated with metformin previously.  He had positive IA 2 and GAD 65 antibody positive consistent with type 1 diabetes mellitus in August 2024.  Patient was restarted on insulin  pump therapy Tandem mobi pump from end of July 2024.  Chronic Diabetes Complications : Retinopathy: Mild nonproliferative retinopathy, annually in 01/2023.  Following with ophthalmology. Nephropathy: no Peripheral neuropathy: no Coronary artery disease: yes Stroke: yes  Relevant comorbidities and cardiovascular risk factors: Obesity: no Body mass index is 25.37 kg/m.  Hypertension: yes Hyperlipidemia. Yes, on a statin.  Current / Home Diabetic regimen includes:  Farxiga  10 mg daily.  Tandem Mobi pump using Humalog  U100  Insulin  Pump setting:  Basal MN- 0.500u/hour 12PM- 0.600  6PM- 0.500  Bolus CHO Ratio (1unit:CHO) MN- 1:12  Correction/Sensitivity: MN- 1:45  Target: 110   Active insulin  time: 5 hours  Prior diabetic medications: Ozempic stopped due to nausea.  CONTINUOUS GLUCOSE MONITORING SYSTEM (CGMS) / INSULIN  PUMP INTERPRETATION:                         Tandem Mobi Pump & Sensor Download (Reviewed and summarized below.) Pump: Dexcom G7 and Tandem t:slim /Mobi Dates: May 15 to July 15, 2009, 2025,  14 days  Glucose Management Indicator: 7.7%     Average total daily insulin :  33 units, Basal: 55%, Bolus: 45%,  (Manual Bolus: 47%,  Control IQ Bolus: 53%)    Trends:  Frequent postprandial hyperglycemia with blood sugar up to 200-270 related to no food bolus with the carb count.  He has been using manual boluses or dependent upon the control IQ bolus for hyperglycemia correction.  Blood sugar overnight mostly acceptable.  He has been using sleep mode.  Blood sugar in between the meals are acceptable.  No hypoglycemia.  Hypoglycemia: Patient has no hypoglycemic episodes. Patient has hypoglycemia awareness.    Factors modifying glucose control: 1.  Diabetic diet assessment: 3 meals a day.  2.  Staying active or exercising: Exercising at gym.  3.  Medication compliance: compliant all of the time.  # Hypothyroidism : He was diagnosed with hypothyroidism in 2008 and has been on thyroid  hormone replacement since the diagnosis.  He is currently taking levothyroxine  125 mcg daily.   Interval history  Pump and CGM data is reviewed and noted above.  Still with postprandial hyperglycemia with not using meal boluses with carb counting.  Hemoglobin A1c improved to 7.2%.  Patient reports he was traveling and was having somewhat  high blood sugar lately.  No numbness and ting of the feet.  No vision problem.  He started gym with strength training and exercise, uses activity mode on the pump.  Patient laboratory results reviewed, stable renal function with EGFR 65.  Acceptable lipids level.  Normal thyroid  function test.  Normal urine microalbumin creatinine ratio.  He has been taking levothyroxine  125 mcg daily, no hypo and hyperthyroid symptoms.  No other complaints today.  REVIEW OF SYSTEMS As per history of present illness.   PAST MEDICAL HISTORY: Past Medical History:  Diagnosis Date   Acute kidney injury superimposed on chronic kidney disease (HCC) 01/25/2015   AICD (automatic  cardioverter/defibrillator) present    CHF (congestive heart failure) (HCC)    CORONARY ARTERY DISEASE 08/11/2006   CVA 12/19/2006   2008   DIABETES MELLITUS, TYPE I 08/11/2006   ECZEMA, HANDS 12/15/2009   History of kidney stones    10+ years ago   HYPERLIPIDEMIA 08/11/2006   HYPERTENSION 08/11/2006   Hypothyroidism    Myocardial infarction [facility])    Presence of permanent cardiac pacemaker    Type 2 diabetes mellitus without complications (HCC) 12/20/2016    PAST SURGICAL HISTORY: Past Surgical History:  Procedure Laterality Date   CARDIAC CATHETERIZATION N/A 01/16/2015   Procedure: Left Heart Cath and Coronary Angiography;  Surgeon: Avanell Leigh, MD;  Location: [facility] INVASIVE CV LAB;  Service: Cardiovascular;  Laterality: N/A;   CORONARY STENT PLACEMENT     EP IMPLANTABLE DEVICE N/A 01/10/2016   MDT Claria MRI Quad CRTD implanted by Dr Nunzio Belch   INGUINAL HERNIA REPAIR Left 05/22/2020   Procedure: LEFT INGUINAL HERNIA REPAIR WITH MESH;  Surgeon: Oza Blumenthal, MD;  Location: [facility] OR;  Service: General;  Laterality: Left;   INSERT / REPLACE / REMOVE PACEMAKER     INSERTION OF MESH Left 05/22/2020   Procedure: INSERTION OF MESH;  Surgeon: Oza Blumenthal, MD;  Location: [facility] OR;  Service: General;  Laterality: Left;   IR ANGIOGRAM PELVIS SELECTIVE OR SUPRASELECTIVE  08/28/2022   IR ANGIOGRAM SELECTIVE EACH ADDITIONAL VESSEL  08/28/2022   IR ANGIOGRAM SELECTIVE EACH ADDITIONAL VESSEL  08/28/2022   IR ANGIOGRAM SELECTIVE EACH ADDITIONAL VESSEL  08/28/2022   IR EMBO TUMOR ORGAN ISCHEMIA INFARCT INC GUIDE ROADMAPPING  08/28/2022   IR US  GUIDE VASC ACCESS LEFT  08/28/2022   IR US  GUIDE VASC ACCESS LEFT  08/28/2022   IR US  GUIDE VASC ACCESS LEFT  08/28/2022   IR US  GUIDE VASC ACCESS RIGHT  08/28/2022   TEE WITHOUT CARDIOVERSION N/A 01/26/2015   Procedure: TRANSESOPHAGEAL ECHOCARDIOGRAM (TEE);  Surgeon: Luana Rumple, MD;  Location: [facility] ENDOSCOPY;  Service: Cardiovascular;  Laterality: N/A;    TONSILECTOMY, ADENOIDECTOMY, BILATERAL MYRINGOTOMY AND TUBES     TONSILLECTOMY     WISDOM TOOTH EXTRACTION     WRIST FRACTURE SURGERY Right 1960   3rd grade    ALLERGIES: No Known Allergies  FAMILY HISTORY:  Family History  Problem Relation Age of Onset   CAD Father    Other Mother        passed right before 92- tumor on kidney recent diagnosis   Dementia Mother        progressing rapidly   CAD Paternal Grandfather        77s   CAD Paternal Grandmother        58s   Brain cancer Maternal Grandmother    Lymphoma Sister        b cell   Celiac disease  Sister    Diabetes Neg Hx     SOCIAL HISTORY: Social History   Socioeconomic History   Marital status: Married    Spouse name: Not on file   Number of children: Not on file   Years of education: Not on file   Highest education level: Master's degree (e.g., MA, MS, MEng, MEd, MSW, MBA)  Occupational History   Occupation: Engineer, maintenance: [employer]  Tobacco Use   Smoking status: Former    Current packs/day: 0.00    Average packs/day: 1 pack/day for 30.0 years (30.0 ttl pk-yrs)    Types: Cigarettes    Start date: 05/04/1970    Quit date: 05/03/2000    Years since quitting: 23.2   Smokeless tobacco: Never  Vaping Use   Vaping status: Never Used  Substance and Sexual Activity   Alcohol use: Yes    Alcohol/week: 3.0 standard drinks of alcohol    Types: 1 Glasses of wine, 1 Cans of beer, 1 Shots of liquor per week    Comment: 1-2/month   Drug use: No   Sexual activity: Not on file  Other Topics Concern   Not on file  Social History Narrative   Married (wife seen elsewhere- still working in Catering manager 2024). 3 step children, 1 biological. No grandkids.       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      Hobbies: race sail boats, adrenaline related activities   Social Drivers of Corporate investment banker Strain: Low Risk  (07/18/2022)   Overall Financial  Resource Strain (CARDIA)    Difficulty of Paying Living Expenses: Not hard at all  Food Insecurity: No Food Insecurity (07/18/2022)   Hunger Vital Sign    Worried About Running Out of Food in the Last Year: Never true    Ran Out of Food in the Last Year: Never true  Transportation Needs: No Transportation Needs (07/18/2022)   PRAPARE - Administrator, Civil Service (Medical): No    Lack of Transportation (Non-Medical): No  Physical Activity: Sufficiently Active (07/18/2022)   Exercise Vital Sign    Days of Exercise per Week: 5 days    Minutes of Exercise per Session: 60 min  Stress: No Stress Concern Present (07/18/2022)   [employer] of Occupational Health - Occupational Stress Questionnaire    Feeling of Stress : Not at all  Social Connections: Moderately Integrated (07/18/2022)   Social Connection and Isolation Panel [NHANES]    Frequency of Communication with Friends and Family: More than three times a week    Frequency of Social Gatherings with Friends and Family: Twice a week    Attends Religious Services: Never    Database administrator or Organizations: Yes    Attends Engineer, structural: More than 4 times per year    Marital Status: Married  Recent Concern: Social Connections - Moderately Isolated (07/04/2022)   Social Connection and Isolation Panel [NHANES]    Frequency of Communication with Friends and Family: Once a week    Frequency of Social Gatherings with Friends and Family: Once a week    Attends Religious Services: Never    Database administrator or Organizations: Yes    Attends Engineer, structural: 1 to 4 times per year    Marital Status: Married    MEDICATIONS:  Current Outpatient Medications  Medication Sig Dispense Refill   Blood Glucose Monitoring Suppl (ONETOUCH VERIO) w/Device  KIT 1 Device by Does not apply route daily. 1 kit 0   carvedilol  (COREG ) 6.25 MG tablet TAKE 1 TABLET(6.25 MG) BY MOUTH TWICE DAILY 180 tablet 2    CINNAMON PO Take 1 tablet by mouth daily.     clopidogrel  (PLAVIX ) 75 MG tablet Take 1 tablet (75 mg total) by mouth daily. 90 tablet 2   Coenzyme Q10 (COQ10) 200 MG CAPS Take 200 mg by mouth in the morning.     Continuous Glucose Sensor (DEXCOM G7 SENSOR) MISC USE FOR CONTINUOUS BLOOD GLUCOSE MONITORING. REPLACE EVERY 10 DAYS 3 each 3   dapagliflozin  propanediol (FARXIGA ) 10 MG TABS tablet Take 1 tablet (10mg ) by mouth daily before breakfast 90 tablet 0   ezetimibe  (ZETIA ) 10 MG tablet TAKE 1 TABLET(10 MG) BY MOUTH DAILY 90 tablet 3   GARLIC PO Take 1 tablet by mouth daily.     Glucagon  (GVOKE HYPOPEN  2-PACK) 0.5 MG/0.1ML SOAJ Use for severe lows 0.2 mL 1   glucose blood (ONETOUCH VERIO) test strip 1 each by Other route 2 (two) times daily. And lancets 2/day 200 each 12   insulin  lispro (HUMALOG ) 100 UNIT/ML injection Use 60 units per day via pump. 10 mL 11   Insulin  Lispro Prot & Lispro (HUMALOG  MIX 50/50 KWIKPEN) (50-50) 100 UNIT/ML Kwikpen Inject under the skin 7 units in every morning , 3 units at lunch and 7 units at dinner 30 mL 11   Insulin  Pen Needle (PEN NEEDLES) 32G X 4 MM MISC Use to inject insulin  4 times a day 100 each 2   levothyroxine  (SYNTHROID ) 125 MCG tablet Take 1 tablet (125 mcg total) by mouth daily. 90 tablet 3   Probiotic Product (PROBIOTIC PO) Take 1 capsule by mouth every morning.     rosuvastatin  (CRESTOR ) 10 MG tablet Take 1 tablet (10 mg total) by mouth daily. 90 tablet 3   sacubitril -valsartan  (ENTRESTO ) 49-51 MG Take 1 tablet by mouth 2 (two) times daily. 180 tablet 3   tadalafil  (CIALIS ) 5 MG tablet TAKE 1 TABLET(5 MG) BY MOUTH DAILY 90 tablet 3   TOUJEO  SOLOSTAR 300 UNIT/ML Solostar Pen ADMINISTER 38 UNITS UNDER THE SKIN EVERY DAY 6 mL 3   HUMALOG  KWIKPEN 100 UNIT/ML KwikPen INJECT UNDER THE SKIN 7 UNITS EVERY MORNING, 3 UNITS AT LUNCH, AND 7 UNITS AT DINNER. (Patient not taking: Reported on 07/16/2023) 30 mL 0   tamsulosin (FLOMAX) 0.4 MG CAPS capsule Take 0.4 mg  by mouth daily.     No current facility-administered medications for this visit.    PHYSICAL EXAM: Vitals:   07/16/23 0809  BP: 122/80  Pulse: 60  Resp: 20  SpO2: 96%  Weight: 171 lb 12.8 oz (77.9 kg)  Height: 5' 9 (1.753 m)     Body mass index is 25.37 kg/m.  Wt Readings from Last 3 Encounters:  07/16/23 171 lb 12.8 oz (77.9 kg)  04/08/23 173 lb (78.5 kg)  01/08/23 170 lb 9.6 oz (77.4 kg)    General: Well developed, well nourished male in no apparent distress.  HEENT: AT/[place], no external lesions.  Eyes: Conjunctiva clear and no icterus. Neck: Neck supple  Lungs: Respirations not labored Neurologic: Alert, oriented, normal speech Extremities / Skin: Dry. Pump site okay.  Psychiatric: Does not appear depressed or anxious  Diabetic Foot Exam - Simple   No data filed    LABS Reviewed Lab Results  Component Value Date   HGBA1C 7.2 (A) 07/16/2023   HGBA1C 7.9 (A) 04/08/2023  HGBA1C 8.0 (A) 01/08/2023   Lab Results  Component Value Date   FRUCTOSAMINE 319 (H) 06/18/2022   FRUCTOSAMINE 413 (H) 11/24/2015   Lab Results  Component Value Date   CHOL 136 07/14/2023   HDL 71 07/14/2023   LDLCALC 50 07/14/2023   LDLDIRECT 85.0 07/11/2017   TRIG 70 07/14/2023   CHOLHDL 1.9 07/14/2023   Lab Results  Component Value Date   MICRALBCREAT NOTE 07/14/2023   MICRALBCREAT 1.6 07/08/2022   Lab Results  Component Value Date   CREATININE 1.20 07/14/2023   Lab Results  Component Value Date   GFR 56.59 (L) 06/18/2022    Latest Reference Range & Units 10/04/22 10:35  IA-2 Antibody <5.4 U/mL 8.3 (H)  ZNT8 Antibodies <15 U/mL <10  Glutamic Acid Decarb Ab <5 IU/mL 19 (H)  (H): Data is abnormally high  ASSESSMENT / PLAN  1. Type 1 diabetes mellitus with retinopathy, macular edema presence unspecified, unspecified laterality, unspecified retinopathy severity (HCC)     Diabetes Mellitus type 1, complicated by diabetic retinopathy.  He had type I antibodies positive  consistent with type 1 diabetes mellitus.  - Diabetic status / severity: Uncontrolled, improving.  Lab Results  Component Value Date   HGBA1C 7.2 (A) 07/16/2023    - Hemoglobin A1c goal <6.5%   - Medications:   Discussed about bolusing for meals before eating with carb count to prevent postprandial hyperglycemia.  Discussed that to make the diabetes control better and need to bolus for meals prior to eating.   Adjusted pump setting as follows : No change today.  Discussed about using exercise mode in the pump when exercising.  Okay to use sleep or during night.  - Home glucose testing: continue CGM and check blood glucose as needed.  - Discussed/ Gave Hypoglycemia treatment plan.  # Consult : not required at this time.   # Annual urine for microalbuminuria/ creatinine ratio, no microalbuminuria currently. Last  Lab Results  Component Value Date   MICRALBCREAT NOTE 07/14/2023    # Foot check nightly / neuropathy.  # Annual dilated diabetic eye exams.   - Diet: Make healthy diabetic food choices - Life style / activity / exercise: discussed.  2. Blood pressure  -  BP Readings from Last 1 Encounters:  07/16/23 122/80    - Control is in target.  - No change in current plans.  3. Lipid status / Hyperlipidemia - Last  Lab Results  Component Value Date   LDLCALC 50 07/14/2023   - Continue rosuvastatin  20 mg daily and Zetia  10 mg daily, managed by cardiology.    # Primary hypothyroidism -Currently taking levothyroxine  125 mcg daily.  Recent thyroid  function test normal.  Annual thyroid  lab.  Diagnoses and all orders for this visit:  Type 1 diabetes mellitus with retinopathy, macular edema presence unspecified, unspecified laterality, unspecified retinopathy severity (HCC) -     POCT glycosylated hemoglobin (Hb A1C)    DISPOSITION Follow up in clinic in 4 months suggested.  Labs on the same day of the visit.  All questions answered and patient verbalized  understanding of the plan.  [place] Jayvien Rowlette, MD [facility] Endocrinology [facility] [address] [place], [address] [place], [place] [address] Phone # [phone]  At least part of this note was generated using voice recognition software. Inadvertent word errors may have occurred, which were not recognized during the proofreading process.

## 2023-07-16 NOTE — Patient Instructions (Signed)
 Latest Reference Range & Units 01/08/23 08:26 04/08/23 08:31 07/16/23 08:10  Hemoglobin A1C 4.0 - 5.6 % -  8.0 ! Pend 7.9 ! Pend 7.2 ! Pend  !: Data is abnormal

## 2023-07-23 ENCOUNTER — Ambulatory Visit (INDEPENDENT_AMBULATORY_CARE_PROVIDER_SITE_OTHER)

## 2023-07-23 ENCOUNTER — Encounter: Payer: Self-pay | Admitting: Family Medicine

## 2023-07-23 VITALS — Ht 69.0 in | Wt 165.0 lb

## 2023-07-23 DIAGNOSIS — Z0000 Encounter for general adult medical examination without abnormal findings: Secondary | ICD-10-CM

## 2023-07-23 NOTE — Progress Notes (Signed)
 Subjective:   John Carrillo is a 73 y.o. who presents for a Medicare Wellness preventive visit.  As a reminder, Annual Wellness Visits don't include a physical exam, and some assessments may be limited, especially if this visit is performed virtually. We may recommend an in-person follow-up visit with your provider if needed.  Visit Complete: Virtual I connected with  John Carrillo on 07/23/23 by a audio enabled telemedicine application and verified that I am speaking with the correct person using two identifiers.  Patient Location: Home  Provider Location: Home Office  I discussed the limitations of evaluation and management by telemedicine. The patient expressed understanding and agreed to proceed.  Vital Signs: Because this visit was a virtual/telehealth visit, some criteria may be missing or patient reported. Any vitals not documented were not able to be obtained and vitals that have been documented are patient reported.  VideoDeclined- This patient declined Librarian, academic. Therefore the visit was completed with audio only.  Persons Participating in Visit: Patient.  AWV Questionnaire: No: Patient Medicare AWV questionnaire was not completed prior to this visit.  Cardiac Risk Factors include: advanced age (>52men, >16 women);diabetes mellitus;dyslipidemia;male gender;hypertension     Objective:    Today's Vitals   07/23/23 0848  Weight: 165 lb (74.8 kg)  Height: 5' 9 (1.753 m)   Body mass index is 24.37 kg/m.     07/23/2023    8:52 AM 08/28/2022    8:02 AM 07/22/2022    1:42 PM 07/01/2022    3:09 PM 09/02/2021    2:46 AM 05/22/2020   11:29 AM 05/18/2020    9:10 AM  Advanced Directives  Does Patient Have a Medical Advance Directive? Yes Yes Yes Yes No  Yes  Type of Estate agent of [place];Living will Healthcare Power of [place];Living will Healthcare Power of [place];Living will Healthcare Power of  [place];Living will   Healthcare Power of Attorney  Does patient want to make changes to medical advance directive?  No - Patient declined  No - Patient declined  No - Patient declined No - Patient declined  Copy of Healthcare Power of Attorney in Chart? No - copy requested No - copy requested No - copy requested No - copy requested   No - copy requested    Current Medications (verified) Outpatient Encounter Medications as of 07/23/2023  Medication Sig   Blood Glucose Monitoring Suppl (ONETOUCH VERIO) w/Device KIT 1 Device by Does not apply route daily.   carvedilol  (COREG ) 6.25 MG tablet TAKE 1 TABLET(6.25 MG) BY MOUTH TWICE DAILY   CINNAMON PO Take 1 tablet by mouth daily.   clopidogrel  (PLAVIX ) 75 MG tablet Take 1 tablet (75 mg total) by mouth daily.   Coenzyme Q10 (COQ10) 200 MG CAPS Take 200 mg by mouth in the morning.   Continuous Glucose Sensor (DEXCOM G7 SENSOR) MISC USE FOR CONTINUOUS BLOOD GLUCOSE MONITORING. REPLACE EVERY 10 DAYS   dapagliflozin  propanediol (FARXIGA ) 10 MG TABS tablet Take 1 tablet (10mg ) by mouth daily before breakfast   ezetimibe  (ZETIA ) 10 MG tablet TAKE 1 TABLET(10 MG) BY MOUTH DAILY   GARLIC PO Take 1 tablet by mouth daily.   Glucagon  (GVOKE HYPOPEN  2-PACK) 0.5 MG/0.1ML SOAJ Use for severe lows   glucose blood (ONETOUCH VERIO) test strip 1 each by Other route 2 (two) times daily. And lancets 2/day   HUMALOG  KWIKPEN 100 UNIT/ML KwikPen INJECT UNDER THE SKIN 7 UNITS EVERY MORNING, 3 UNITS AT LUNCH, AND 7 UNITS  AT DINNER.   Insulin  Infusion Pump Supplies (TANDEM MOBI AUTOSOFT XC KIT) MISC    insulin  lispro (HUMALOG ) 100 UNIT/ML injection Use 60 units per day via pump.   Insulin  Lispro Prot & Lispro (HUMALOG  MIX 50/50 KWIKPEN) (50-50) 100 UNIT/ML Kwikpen Inject under the skin 7 units in every morning , 3 units at lunch and 7 units at dinner   Insulin  Pen Needle (PEN NEEDLES) 32G X 4 MM MISC Use to inject insulin  4 times a day   levothyroxine  (SYNTHROID ) 125 MCG  tablet Take 1 tablet (125 mcg total) by mouth daily.   Probiotic Product (PROBIOTIC PO) Take 1 capsule by mouth every morning.   rosuvastatin  (CRESTOR ) 10 MG tablet Take 1 tablet (10 mg total) by mouth daily.   sacubitril -valsartan  (ENTRESTO ) 49-51 MG Take 1 tablet by mouth 2 (two) times daily.   tadalafil  (CIALIS ) 5 MG tablet TAKE 1 TABLET(5 MG) BY MOUTH DAILY   TOUJEO  SOLOSTAR 300 UNIT/ML Solostar Pen ADMINISTER 38 UNITS UNDER THE SKIN EVERY DAY   tamsulosin (FLOMAX) 0.4 MG CAPS capsule Take 0.4 mg by mouth daily.   No facility-administered encounter medications on file as of 07/23/2023.    Allergies (verified) Patient has no known allergies.   History: Past Medical History:  Diagnosis Date   Acute kidney injury superimposed on chronic kidney disease (HCC) 01/25/2015   AICD (automatic cardioverter/defibrillator) present    CHF (congestive heart failure) (HCC)    CORONARY ARTERY DISEASE 08/11/2006   CVA 12/19/2006   2008   DIABETES MELLITUS, TYPE I 08/11/2006   ECZEMA, HANDS 12/15/2009   History of kidney stones    10+ years ago   HYPERLIPIDEMIA 08/11/2006   HYPERTENSION 08/11/2006   Hypothyroidism    Myocardial infarction [facility])    Presence of permanent cardiac pacemaker    Type 2 diabetes mellitus without complications (HCC) 12/20/2016   Past Surgical History:  Procedure Laterality Date   CARDIAC CATHETERIZATION N/A 01/16/2015   Procedure: Left Heart Cath and Coronary Angiography;  Surgeon: Avanell Leigh, MD;  Location: [facility] INVASIVE CV LAB;  Service: Cardiovascular;  Laterality: N/A;   CORONARY STENT PLACEMENT     EP IMPLANTABLE DEVICE N/A 01/10/2016   MDT [place] MRI Quad CRTD implanted by Dr Nunzio Belch   INGUINAL HERNIA REPAIR Left 05/22/2020   Procedure: LEFT INGUINAL HERNIA REPAIR WITH MESH;  Surgeon: Oza Blumenthal, MD;  Location: [facility] OR;  Service: General;  Laterality: Left;   INSERT / REPLACE / REMOVE PACEMAKER     INSERTION OF MESH Left 05/22/2020   Procedure: INSERTION OF  MESH;  Surgeon: Oza Blumenthal, MD;  Location: [facility];  Service: General;  Laterality: Left;   IR ANGIOGRAM PELVIS SELECTIVE OR SUPRASELECTIVE  08/28/2022   IR ANGIOGRAM SELECTIVE EACH ADDITIONAL VESSEL  08/28/2022   IR ANGIOGRAM SELECTIVE EACH ADDITIONAL VESSEL  08/28/2022   IR ANGIOGRAM SELECTIVE EACH ADDITIONAL VESSEL  08/28/2022   IR EMBO TUMOR ORGAN ISCHEMIA INFARCT INC GUIDE ROADMAPPING  08/28/2022   IR US  GUIDE VASC ACCESS LEFT  08/28/2022   IR US  GUIDE VASC ACCESS LEFT  08/28/2022   IR US  GUIDE VASC ACCESS LEFT  08/28/2022   IR US  GUIDE VASC ACCESS RIGHT  08/28/2022   TEE WITHOUT CARDIOVERSION N/A 01/26/2015   Procedure: TRANSESOPHAGEAL ECHOCARDIOGRAM (TEE);  Surgeon: Luana Rumple, MD;  Location: [facility] ENDOSCOPY;  Service: Cardiovascular;  Laterality: N/A;   TONSILECTOMY, ADENOIDECTOMY, BILATERAL MYRINGOTOMY AND TUBES     TONSILLECTOMY     WISDOM TOOTH EXTRACTION  WRIST FRACTURE SURGERY Right 1960   3rd grade   Family History  Problem Relation Age of Onset   CAD Father    Other Mother        passed right before 31- tumor on kidney recent diagnosis   Dementia Mother        progressing rapidly   CAD Paternal Grandfather        55s   CAD Paternal Grandmother        31s   Brain cancer Maternal Grandmother    Lymphoma Sister        b cell   Celiac disease Sister    Diabetes Neg Hx    Social History   Socioeconomic History   Marital status: Married    Spouse name: Not on file   Number of children: Not on file   Years of education: Not on file   Highest education level: Master's degree (e.g., MA, MS, MEng, MEd, MSW, MBA)  Occupational History   Occupation: Engineer, maintenance: [employer]  Tobacco Use   Smoking status: Former    Current packs/day: 0.00    Average packs/day: 1 pack/day for 30.0 years (30.0 ttl pk-yrs)    Types: Cigarettes    Start date: 05/04/1970    Quit date: 05/03/2000    Years since quitting: 23.2   Smokeless tobacco: Never  Vaping  Use   Vaping status: Never Used  Substance and Sexual Activity   Alcohol use: Yes    Alcohol/week: 3.0 standard drinks of alcohol    Types: 1 Glasses of wine, 1 Cans of beer, 1 Shots of liquor per week    Comment: 1-2/month   Drug use: No   Sexual activity: Not on file  Other Topics Concern   Not on file  Social History Narrative   Married (wife seen elsewhere- still working in Catering manager 2024). 3 step children, 1 biological. No grandkids.       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      Hobbies: race sail boats, adrenaline related activities   Social Drivers of Corporate investment banker Strain: Low Risk  (07/23/2023)   Overall Financial Resource Strain (CARDIA)    Difficulty of Paying Living Expenses: Not hard at all  Food Insecurity: No Food Insecurity (07/23/2023)   Hunger Vital Sign    Worried About Running Out of Food in the Last Year: Never true    Ran Out of Food in the Last Year: Never true  Transportation Needs: No Transportation Needs (07/23/2023)   PRAPARE - Administrator, Civil Service (Medical): No    Lack of Transportation (Non-Medical): No  Physical Activity: Sufficiently Active (07/23/2023)   Exercise Vital Sign    Days of Exercise per Week: 5 days    Minutes of Exercise per Session: 60 min  Stress: No Stress Concern Present (07/23/2023)   [employer] of Occupational Health - Occupational Stress Questionnaire    Feeling of Stress: Not at all  Social Connections: Socially Integrated (07/23/2023)   Social Connection and Isolation Panel    Frequency of Communication with Friends and Family: More than three times a week    Frequency of Social Gatherings with Friends and Family: More than three times a week    Attends Religious Services: 1 to 4 times per year    Active Member of [employer] or Organizations: Yes    Attends Banker Meetings: 1 to 4 times per  year    Marital Status: Married    Tobacco  Counseling Counseling given: Not Answered    Clinical Intake:  Pre-visit preparation completed: Yes  Pain : No/denies pain     BMI - recorded: 24.37 Nutritional Status: BMI of 19-24  Normal Diabetes: Yes CBG done?: Yes (129 per pt) CBG resulted in Enter/ Edit results?: No Did pt. bring in CBG monitor from home?: No  Lab Results  Component Value Date   HGBA1C 7.2 (A) 07/16/2023   HGBA1C 7.9 (A) 04/08/2023   HGBA1C 8.0 (A) 01/08/2023     How often do you need to have someone help you when you read instructions, pamphlets, or other written materials from your doctor or pharmacy?: 1 - Never  Interpreter Needed?: No  Information entered by :: Lamont Pilsner, LPN   Activities of Daily Living     07/23/2023    8:50 AM  In your present state of health, do you have any difficulty performing the following activities:  Hearing? 1  Comment hearing aids  Vision? 0  Difficulty concentrating or making decisions? 0  Walking or climbing stairs? 0  Dressing or bathing? 0  Doing errands, shopping? 0  Preparing Food and eating ? N  Using the Toilet? N  In the past six months, have you accidently leaked urine? N  Do you have problems with loss of bowel control? N  Managing your Medications? N  Managing your Finances? N  Housekeeping or managing your Housekeeping? N    Patient Care Team: Almira Jaeger, MD as PCP - General (Family Medicine) Loyde Rule, MD as PCP - Cardiology (Cardiology) Mealor, Donnamae Gaba, MD as PCP - Electrophysiology (Cardiology) [facility], [place], [place]  I have updated your Care Teams any recent Medical Services you may have received from other providers in the past year.     Assessment:   This is a routine wellness examination for A[place]ion.  Hearing/Vision screen Hearing Screening - Comments:: Hearing aids  Vision Screening - Comments:: Wears rx glasses - up to date with routine eye exams with Dr Nelva Bang [facility]      Goals Addressed             This Visit's Progress    Patient Stated       Continue to work Strength and endurance        Depression Screen     07/23/2023    8:53 AM 07/22/2022    1:42 PM 07/08/2022    2:47 PM 03/14/2020    8:50 AM 05/14/2019    9:53 AM 02/13/2018    8:14 AM 01/03/2017    8:50 AM  PHQ 2/9 Scores  PHQ - 2 Score 0 0 0 0 0 0 0  PHQ- 9 Score  0 0        Fall Risk     07/23/2023    8:58 AM 07/18/2022    2:58 PM 07/08/2022    2:43 PM 05/14/2019    9:53 AM 01/03/2017    8:50 AM  Fall Risk   Falls in the past year? 0 0 0 0  No   Number falls in past yr: 0 0 0 0   Injury with Fall? 0 0 0 0   Risk for fall due to : No Fall Risks Impaired vision No Fall Risks    Follow up Falls prevention discussed Falls prevention discussed Falls evaluation completed       Data saved with a  previous flowsheet row definition    MEDICARE RISK AT HOME:  Medicare Risk at Home Any stairs in or around the home?: Yes If so, are there any without handrails?: No Home free of loose throw rugs in walkways, pet beds, electrical cords, etc?: Yes Adequate lighting in your home to reduce risk of falls?: Yes Life alert?: No Use of a cane, walker or w/c?: No Grab bars in the bathroom?: Yes Shower chair or bench in shower?: Yes Elevated toilet seat or a handicapped toilet?: Yes  TIMED UP AND GO:  Was the test performed?  No  Cognitive Function: 6CIT completed        07/23/2023    9:10 AM 07/22/2022    1:45 PM  6CIT Screen  What Year? 0 points 0 points  What month? 0 points 0 points  What time? 0 points 0 points  Count back from 20 0 points 0 points  Months in reverse 0 points 0 points  Repeat phrase 0 points 0 points  Total Score 0 points 0 points    Immunizations Immunization History  Administered Date(s) Administered   Influenza Split 11/15/2010, 11/15/2011   Influenza Whole 10/23/2007, 11/04/2009, 11/04/2012   Influenza, High Dose Seasonal PF 11/08/2021, 11/14/2022    Influenza,inj,Quad PF,6+ Mos 10/28/2014   Influenza-Unspecified 10/05/2013, 10/16/2016, 11/19/2017, 12/03/2019, 11/07/2020   PFIZER Comirnaty(Gray Top)Covid-19 Tri-Sucrose Vaccine 10/27/2021, 07/15/2022   PFIZER(Purple Top)SARS-COV-2 Vaccination 03/13/2019, 04/07/2019, 10/30/2019, 10/11/2020   Pfizer(Comirnaty)Fall Seasonal Vaccine 12 years and older 01/01/2023   Pneumococcal Conjugate-13 06/24/2014   Pneumococcal Polysaccharide-23 07/07/2008, 01/03/2017   Respiratory Syncytial Virus Vaccine,Recomb Aduvanted(Arexvy) 11/14/2022   Tdap 11/15/2010, 07/15/2022   Zoster Recombinant(Shingrix ) 02/20/2018, 04/28/2018   Zoster, Live 11/15/2011    Screening Tests Health Maintenance  Topic Date Due   COVID-19 Vaccine (8 - 2024-25 season) 07/01/2023   INFLUENZA VACCINE  09/05/2023   HEMOGLOBIN A1C  01/15/2024   OPHTHALMOLOGY EXAM  01/24/2024   FOOT EXAM  04/07/2024   Diabetic kidney evaluation - eGFR measurement  07/13/2024   Diabetic kidney evaluation - Urine ACR  07/13/2024   Medicare Annual Wellness (AWV)  07/22/2024   Fecal DNA (Cologuard)  07/18/2025   Pneumococcal Vaccine: 50+ Years  Completed   Hepatitis C Screening  Completed   Zoster Vaccines- Shingrix   Completed   HPV VACCINES  Aged Out   Meningococcal B Vaccine  Aged Out   DTaP/Tdap/Td  Discontinued    Health Maintenance  Health Maintenance Due  Topic Date Due   COVID-19 Vaccine (8 - 2024-25 season) 07/01/2023   Health Maintenance Items Addressed: See Nurse Notes at the end of this note  Additional Screening:  Vision Screening: Recommended annual ophthalmology exams for early detection of glaucoma and other disorders of the eye. Would you like a referral to an eye doctor? No    Dental Screening: Recommended annual dental exams for proper oral hygiene  Community Resource Referral / Chronic Care Management: CRR required this visit?  No   CCM required this visit?  No   Plan:    I have personally reviewed and  noted the following in the patient's chart:   Medical and social history Use of alcohol, tobacco or illicit drugs  Current medications and supplements including opioid prescriptions. Patient is not currently taking opioid prescriptions. Functional ability and status Nutritional status Physical activity Advanced directives List of other physicians Hospitalizations, surgeries, and ER visits in previous 12 months Vitals Screenings to include cognitive, depression, and falls Referrals and appointments  In addition, I have  reviewed and discussed with patient certain preventive protocols, quality metrics, and best practice recommendations. A written personalized care plan for preventive services as well as general preventive health recommendations were provided to patient.   Bruno Capri, LPN   9/56/2130   After Visit Summary: (MyChart) Due to this being a telephonic visit, the after visit summary with patients personalized plan was offered to patient via MyChart   Notes: Nothing significant to report at this time.

## 2023-07-23 NOTE — Patient Instructions (Signed)
 Mr. Spoelstra , Thank you for taking time out of your busy schedule to complete your Annual Wellness Visit with me. I enjoyed our conversation and look forward to speaking with you again next year. I, as well as your care team,  appreciate your ongoing commitment to your health goals. Please review the following plan we discussed and let me know if I can assist you in the future. Your Game plan/ To Do List    Referrals: If you haven't heard from the office you've been referred to, please reach out to them at the phone provided.   Follow up Visits: Next Medicare AWV with our clinical staff: 07/28/22   Have you seen your provider in the last 6 months (3 months if uncontrolled diabetes)? No Next Office Visit with your provider: 11/25/23  Clinician Recommendations:  Aim for 30 minutes of exercise or brisk walking, 6-8 glasses of water, and 5 servings of fruits and vegetables each day.       This is a list of the screening recommended for you and due dates:  Health Maintenance  Topic Date Due   COVID-19 Vaccine (8 - 2024-25 season) 07/01/2023   Medicare Annual Wellness Visit  07/22/2023   Flu Shot  09/05/2023   Hemoglobin A1C  01/15/2024   Eye exam for diabetics  01/24/2024   Complete foot exam   04/07/2024   Yearly kidney function blood test for diabetes  07/13/2024   Yearly kidney health urinalysis for diabetes  07/13/2024   Cologuard (Stool DNA test)  07/18/2025   Pneumococcal Vaccine for age over 75  Completed   Hepatitis C Screening  Completed   Zoster (Shingles) Vaccine  Completed   HPV Vaccine  Aged Out   Meningitis B Vaccine  Aged Out   DTaP/Tdap/Td vaccine  Discontinued    Advanced directives: (Copy Requested) Please bring a copy of your health care power of attorney and living will to the office to be added to your chart at your convenience. You can mail to [facility] [address] [place], [place] [address] or email to [email or web address] Advance Care  Planning is important because it:  [x]  Makes sure you receive the medical care that is consistent with your values, goals, and preferences  [x]  It provides guidance to your family and loved ones and reduces their decisional burden about whether or not they are making the right decisions based on your wishes.  Follow the link provided in your after visit summary or read over the paperwork we have mailed to you to help you started getting your Advance Directives in place. If you need assistance in completing these, please reach out to us  so that we can help you!  See attachments for Preventive Care and Fall Prevention Tips

## 2023-08-04 ENCOUNTER — Other Ambulatory Visit: Payer: Self-pay | Admitting: Family Medicine

## 2023-08-28 NOTE — Progress Notes (Signed)
 Remote ICD transmission.

## 2023-09-02 ENCOUNTER — Emergency Department (HOSPITAL_COMMUNITY)

## 2023-09-02 ENCOUNTER — Other Ambulatory Visit: Payer: Self-pay | Admitting: Endocrinology

## 2023-09-02 ENCOUNTER — Other Ambulatory Visit: Payer: Self-pay

## 2023-09-02 ENCOUNTER — Observation Stay (HOSPITAL_COMMUNITY)
Admission: EM | Admit: 2023-09-02 | Discharge: 2023-09-03 | Disposition: A | Discharge status: Home / Self Care | Attending: Family Medicine | Admitting: Family Medicine

## 2023-09-02 ENCOUNTER — Encounter: Payer: Self-pay | Admitting: Endocrinology

## 2023-09-02 DIAGNOSIS — Z95810 Presence of automatic (implantable) cardiac defibrillator: Secondary | ICD-10-CM

## 2023-09-02 DIAGNOSIS — F10920 Alcohol use, unspecified with intoxication, uncomplicated: Secondary | ICD-10-CM | POA: Diagnosis not present

## 2023-09-02 DIAGNOSIS — N179 Acute kidney failure, unspecified: Secondary | ICD-10-CM | POA: Insufficient documentation

## 2023-09-02 DIAGNOSIS — E109 Type 1 diabetes mellitus without complications: Secondary | ICD-10-CM | POA: Diagnosis not present

## 2023-09-02 DIAGNOSIS — R55 Syncope and collapse: Principal | ICD-10-CM | POA: Diagnosis present

## 2023-09-02 DIAGNOSIS — E039 Hypothyroidism, unspecified: Secondary | ICD-10-CM | POA: Insufficient documentation

## 2023-09-02 DIAGNOSIS — S3991XA Unspecified injury of abdomen, initial encounter: Secondary | ICD-10-CM | POA: Diagnosis not present

## 2023-09-02 DIAGNOSIS — I110 Hypertensive heart disease with heart failure: Secondary | ICD-10-CM | POA: Diagnosis not present

## 2023-09-02 DIAGNOSIS — M549 Dorsalgia, unspecified: Secondary | ICD-10-CM | POA: Diagnosis not present

## 2023-09-02 DIAGNOSIS — R404 Transient alteration of awareness: Secondary | ICD-10-CM | POA: Diagnosis not present

## 2023-09-02 DIAGNOSIS — I499 Cardiac arrhythmia, unspecified: Secondary | ICD-10-CM | POA: Diagnosis not present

## 2023-09-02 DIAGNOSIS — I2510 Atherosclerotic heart disease of native coronary artery without angina pectoris: Secondary | ICD-10-CM | POA: Diagnosis not present

## 2023-09-02 DIAGNOSIS — D696 Thrombocytopenia, unspecified: Secondary | ICD-10-CM | POA: Diagnosis not present

## 2023-09-02 DIAGNOSIS — I5022 Chronic systolic (congestive) heart failure: Secondary | ICD-10-CM | POA: Diagnosis not present

## 2023-09-02 DIAGNOSIS — S3993XA Unspecified injury of pelvis, initial encounter: Secondary | ICD-10-CM | POA: Diagnosis not present

## 2023-09-02 DIAGNOSIS — I255 Ischemic cardiomyopathy: Secondary | ICD-10-CM

## 2023-09-02 DIAGNOSIS — I9589 Other hypotension: Secondary | ICD-10-CM | POA: Diagnosis not present

## 2023-09-02 DIAGNOSIS — S299XXA Unspecified injury of thorax, initial encounter: Secondary | ICD-10-CM | POA: Diagnosis not present

## 2023-09-02 DIAGNOSIS — Z79899 Other long term (current) drug therapy: Secondary | ICD-10-CM | POA: Insufficient documentation

## 2023-09-02 DIAGNOSIS — I517 Cardiomegaly: Secondary | ICD-10-CM | POA: Diagnosis not present

## 2023-09-02 DIAGNOSIS — E1051 Type 1 diabetes mellitus with diabetic peripheral angiopathy without gangrene: Secondary | ICD-10-CM | POA: Diagnosis not present

## 2023-09-02 DIAGNOSIS — I639 Cerebral infarction, unspecified: Secondary | ICD-10-CM | POA: Diagnosis not present

## 2023-09-02 DIAGNOSIS — I6782 Cerebral ischemia: Secondary | ICD-10-CM | POA: Diagnosis not present

## 2023-09-02 DIAGNOSIS — S199XXA Unspecified injury of neck, initial encounter: Secondary | ICD-10-CM | POA: Diagnosis not present

## 2023-09-02 DIAGNOSIS — S0990XA Unspecified injury of head, initial encounter: Secondary | ICD-10-CM | POA: Diagnosis not present

## 2023-09-02 LAB — COMPREHENSIVE METABOLIC PANEL WITH GFR
ALT: 23 U/L (ref 0–44)
AST: 30 U/L (ref 15–41)
Albumin: 3.5 g/dL (ref 3.5–5.0)
Alkaline Phosphatase: 48 U/L (ref 38–126)
Anion gap: 12 (ref 5–15)
BUN: 21 mg/dL (ref 8–23)
CO2: 23 mmol/L (ref 22–32)
Calcium: 8.6 mg/dL — ABNORMAL LOW (ref 8.9–10.3)
Chloride: 103 mmol/L (ref 98–111)
Creatinine, Ser: 2.19 mg/dL — ABNORMAL HIGH (ref 0.61–1.24)
GFR, Estimated: 31 mL/min — ABNORMAL LOW (ref >60)
Glucose, Bld: 248 mg/dL — ABNORMAL HIGH (ref 70–99)
Potassium: 4.1 mmol/L (ref 3.5–5.1)
Sodium: 138 mmol/L (ref 135–145)
Total Bilirubin: 1.4 mg/dL — ABNORMAL HIGH (ref 0.0–1.2)
Total Protein: 6.2 g/dL — ABNORMAL LOW (ref 6.5–8.1)

## 2023-09-02 LAB — I-STAT CHEM 8, ED
BUN: 24 mg/dL — ABNORMAL HIGH (ref 8–23)
Calcium, Ion: 0.99 mmol/L — ABNORMAL LOW (ref 1.15–1.40)
Chloride: 103 mmol/L (ref 98–111)
Creatinine, Ser: 2.3 mg/dL — ABNORMAL HIGH (ref 0.61–1.24)
Glucose, Bld: 249 mg/dL — ABNORMAL HIGH (ref 70–99)
HCT: 48 % (ref 39.0–52.0)
Hemoglobin: 16.3 g/dL (ref 13.0–17.0)
Potassium: 4 mmol/L (ref 3.5–5.1)
Sodium: 139 mmol/L (ref 135–145)
TCO2: 23 mmol/L (ref 22–32)

## 2023-09-02 LAB — SAMPLE TO BLOOD BANK

## 2023-09-02 LAB — TROPONIN I (HIGH SENSITIVITY)
Troponin I (High Sensitivity): 16 ng/L (ref <18)
Troponin I (High Sensitivity): 22 ng/L — ABNORMAL HIGH (ref <18)

## 2023-09-02 LAB — CBC
HCT: 46.3 % (ref 39.0–52.0)
Hemoglobin: 15.6 g/dL (ref 13.0–17.0)
MCH: 32.8 pg (ref 26.0–34.0)
MCHC: 33.7 g/dL (ref 30.0–36.0)
MCV: 97.3 fL (ref 80.0–100.0)
Platelets: 160 K/uL (ref 150–400)
RBC: 4.76 MIL/uL (ref 4.22–5.81)
RDW: 12.4 % (ref 11.5–15.5)
WBC: 11.6 K/uL — ABNORMAL HIGH (ref 4.0–10.5)
nRBC: 0 % (ref 0.0–0.2)

## 2023-09-02 LAB — CBG MONITORING, ED
Glucose-Capillary: 240 mg/dL — ABNORMAL HIGH (ref 70–99)
Glucose-Capillary: 174 mg/dL — ABNORMAL HIGH (ref 70–99)

## 2023-09-02 LAB — PROTIME-INR
INR: 0.9 (ref 0.8–1.2)
Prothrombin Time: 12.9 s (ref 11.4–15.2)

## 2023-09-02 LAB — I-STAT CG4 LACTIC ACID, ED: Lactic Acid, Venous: 1.8 mmol/L (ref 0.5–1.9)

## 2023-09-02 LAB — ETHANOL: Alcohol, Ethyl (B): <15 mg/dL (ref <15)

## 2023-09-02 MED ORDER — MELATONIN 3 MG PO TABS: 3.0000 mg | ORAL_TABLET | Freq: Every evening | ORAL | Status: DC | PRN

## 2023-09-02 MED ORDER — ONDANSETRON HCL 4 MG/2ML IJ SOLN: 4.0000 mg | Freq: Four times a day (QID) | INTRAMUSCULAR | Status: DC | PRN

## 2023-09-02 MED ORDER — ACETAMINOPHEN 650 MG RE SUPP
650.0000 mg | Freq: Four times a day (QID) | RECTAL | Status: DC | PRN
Start: 2023-09-02 — End: 2023-09-03

## 2023-09-02 MED ORDER — SODIUM CHLORIDE 0.9 % IV BOLUS
1000.0000 mL | Freq: Once | INTRAVENOUS | Status: AC
  Administered 2023-09-02: 1000 mL via INTRAVENOUS

## 2023-09-02 MED ORDER — LACTATED RINGERS IV SOLN: INTRAVENOUS | Status: DC

## 2023-09-02 MED ORDER — INSULIN ASPART 100 UNIT/ML IJ SOLN: 0.0000 [IU] | Freq: Three times a day (TID) | INTRAMUSCULAR | Status: DC

## 2023-09-02 MED ORDER — ACETAMINOPHEN 325 MG PO TABS: 650.0000 mg | ORAL_TABLET | Freq: Four times a day (QID) | ORAL | Status: DC | PRN

## 2023-09-02 MED ORDER — IOHEXOL 350 MG/ML SOLN
100.0000 mL | Freq: Once | INTRAVENOUS | Status: AC | PRN
  Administered 2023-09-02: 100 mL via INTRAVENOUS

## 2023-09-02 MED ORDER — INSULIN ASPART 100 UNIT/ML IJ SOLN: 0.0000 [IU] | Freq: Every day | INTRAMUSCULAR | Status: DC

## 2023-09-02 NOTE — H&P (Signed)
 TRAUMA H&P  09/02/2023, 5:08 PM   Chief Complaint: Level 1 trauma activation for hypotension  Primary Survey:  ABC's intact on arrival  The patient is an 73 y.o. male.   HPI: 66M with significant cardiac history including pacemaker and h/o prior stroke presents after a syncopal fall. Hypotensive for EMS with SBP 70s.   Past Medical History:  Diagnosis Date   Acute kidney injury superimposed on chronic kidney disease (HCC) 01/25/2015   AICD (automatic cardioverter/defibrillator) present    CHF (congestive heart failure) (HCC)    CORONARY ARTERY DISEASE 08/11/2006   CVA 12/19/2006   2008   DIABETES MELLITUS, TYPE I 08/11/2006   ECZEMA, HANDS 12/15/2009   History of kidney stones    10+ years ago   HYPERLIPIDEMIA 08/11/2006   HYPERTENSION 08/11/2006   Hypothyroidism    Myocardial infarction [facility])    Presence of permanent cardiac pacemaker    Type 2 diabetes mellitus without complications (HCC) 12/20/2016    Past Surgical History:  Procedure Laterality Date   CARDIAC CATHETERIZATION N/A 01/16/2015   Procedure: Left Heart Cath and Coronary Angiography;  Surgeon: Dorn JINNY Lesches, MD;  Location: [facility] INVASIVE CV LAB;  Service: Cardiovascular;  Laterality: N/A;   CORONARY STENT PLACEMENT     EP IMPLANTABLE DEVICE N/A 01/10/2016   MDT Claria MRI Quad CRTD implanted by Dr Kelsie   INGUINAL HERNIA REPAIR Left 05/22/2020   Procedure: LEFT INGUINAL HERNIA REPAIR WITH MESH;  Surgeon: Vernetta Berg, MD;  Location: [facility] OR;  Service: General;  Laterality: Left;   INSERT / REPLACE / REMOVE PACEMAKER     INSERTION OF MESH Left 05/22/2020   Procedure: INSERTION OF MESH;  Surgeon: Vernetta Berg, MD;  Location: [facility] OR;  Service: General;  Laterality: Left;   IR ANGIOGRAM PELVIS SELECTIVE OR SUPRASELECTIVE  08/28/2022   IR ANGIOGRAM SELECTIVE EACH ADDITIONAL VESSEL  08/28/2022   IR ANGIOGRAM SELECTIVE EACH ADDITIONAL VESSEL  08/28/2022   IR ANGIOGRAM SELECTIVE EACH ADDITIONAL VESSEL  08/28/2022    IR EMBO TUMOR ORGAN ISCHEMIA INFARCT INC GUIDE ROADMAPPING  08/28/2022   IR US  GUIDE VASC ACCESS LEFT  08/28/2022   IR US  GUIDE VASC ACCESS LEFT  08/28/2022   IR US  GUIDE VASC ACCESS LEFT  08/28/2022   IR US  GUIDE VASC ACCESS RIGHT  08/28/2022   TEE WITHOUT CARDIOVERSION N/A 01/26/2015   Procedure: TRANSESOPHAGEAL ECHOCARDIOGRAM (TEE);  Surgeon: Jerel Balding, MD;  Location: [facility] ENDOSCOPY;  Service: Cardiovascular;  Laterality: N/A;   TONSILECTOMY, ADENOIDECTOMY, BILATERAL MYRINGOTOMY AND TUBES     TONSILLECTOMY     WISDOM TOOTH EXTRACTION     WRIST FRACTURE SURGERY Right 1960   3rd grade    No pertinent family history.  Social History:  reports that he quit smoking about 23 years ago. His smoking use included cigarettes. He started smoking about 53 years ago. He has a 30 pack-year smoking history. He has never used smokeless tobacco. He reports current alcohol use of about 3.0 standard drinks of alcohol per week. He reports that he does not use drugs.    Allergies: No Known Allergies  Medications: reviewed  Results for orders placed or performed during the hospital encounter of 09/02/23 (from the past 48 hours)  CBG monitoring, ED     Status: Abnormal   Collection Time: 09/02/23  4:56 PM  Result Value Ref Range   Glucose-Capillary 240 (H) 70 - 99 mg/dL    Comment: Glucose reference range applies only to samples taken after fasting for  at least 8 hours.  I-Stat Chem 8, ED     Status: Abnormal   Collection Time: 09/02/23  5:04 PM  Result Value Ref Range   Sodium 139 135 - 145 mmol/L   Potassium 4.0 3.5 - 5.1 mmol/L   Chloride 103 98 - 111 mmol/L   BUN 24 (H) 8 - 23 mg/dL   Creatinine, Ser 7.69 (H) 0.61 - 1.24 mg/dL   Glucose, Bld 750 (H) 70 - 99 mg/dL    Comment: Glucose reference range applies only to samples taken after fasting for at least 8 hours.   Calcium , Ion 0.99 (L) 1.15 - 1.40 mmol/L   TCO2 23 22 - 32 mmol/L   Hemoglobin 16.3 13.0 - 17.0 g/dL   HCT 51.9 60.9 - 47.9 %     No results found.  ROS 10 point review of systems is negative except as listed above in HPI.  Blood pressure 117/89, pulse 74, resp. rate 15, height 5' 9 (1.753 m), weight 75.8 kg, SpO2 96%.  Secondary Survey:  GCS: E(4)//V(5)//M(6) Constitutional: well-developed, well-nourished Skull: normocephalic, atraumatic Eyes: pupils equal, round, reactive to light, 2mm b/l, moist conjunctiva Face/ENT: midface stable without deformity, normal  dentition, external inspection of ears and nose normal, hearing intact  Oropharynx: normal oropharyngeal mucosa, no blood Neck: no thyromegaly, trachea midline, no midline cervical tenderness to palpation, no C-spine stepoffs Chest: breath sounds equal bilaterally, normal  respiratory effort, no midline or lateral chest wall tenderness to palpation/deformity Abdomen: soft, NT, no bruising, no hepatosplenomegaly FAST: not performed Pelvis: stable GU: no blood at urethral meatus of penis, no scrotal masses or abnormality Back: no wounds, no T/L spine TTP, no T/L spine stepoffs Rectal: deferred Extremities: 2+  radial and pedal pulses bilaterally, intact motor and sensation of bilateral UE and LE, no peripheral edema MSK: unable to assess gait/station, no clubbing/cyanosis of fingers/toes, normal ROM of all four extremities Skin: warm, dry, no rashes  CXR in TB: unremarkable Pelvis XR in TB: unremarkable    Assessment/Plan: Plan Syncopal fall - recommend IMS admission, trauma scans negative for acute injury Incidental renal mass - recommend f/u with urology FEN - regular diet okay from my standpoint Dispo - Cleared from trauma perspective, awaiting disposition determination by IMS service   Dreama GEANNIE Hanger, MD General and Trauma Surgery [facility]

## 2023-09-02 NOTE — Progress Notes (Signed)
 Chaplain responded to Level 1 Trauma.  Pt was out of room upon arrival. Chaplain inquired if any family present; nurse denied any family present.    Chaplain on standby throughout the night if needed.  [place] Chaplain

## 2023-09-02 NOTE — Consult Note (Signed)
 CARDIOLOGY CONSULT NOTE       Patient ID: John Carrillo MRN: [id number] DOB/AGE: 06/29/1950 73 y.o.  Admit date: 09/02/2023 Referring Physician: Paola Primary Physician: Katrinka Garnette KIDD, MD Primary Cardiologist: Delford Reason for Consultation: Syncope  Active Problems:   * No active hospital problems. *   HPI:  73 y.o. with history of ischemic DCM. Post AICD. Last TTEdone 09/02/21 with EF 35-40%. He has not had any clinical angina or CHF. He is retired past 3 years and active Was in bathroom washing up. Had had an hour of skin burning across his chest then got diaphoretic ? Vagal episode and fainted hitting his left hip and back of his head. Burning pain on skin like my previous pericarditis but not angina. He denies any AICD firing ECG stable no acute changes with a sense V pacing He is lucid with no concussion and no focal neuro deficits Boney CT head negative CT abdomen unfortunately ? 2.5 cm right renal lesion suspicious for neoplasm Troponin negative  ROS All other systems reviewed and negative except as noted above  Past Medical History:  Diagnosis Date   Acute kidney injury superimposed on chronic kidney disease (HCC) 01/25/2015   AICD (automatic cardioverter/defibrillator) present    CHF (congestive heart failure) (HCC)    CORONARY ARTERY DISEASE 08/11/2006   CVA 12/19/2006   2008   DIABETES MELLITUS, TYPE I 08/11/2006   ECZEMA, HANDS 12/15/2009   History of kidney stones    10+ years ago   HYPERLIPIDEMIA 08/11/2006   HYPERTENSION 08/11/2006   Hypothyroidism    Myocardial infarction [facility])    Presence of permanent cardiac pacemaker    Type 2 diabetes mellitus without complications (HCC) 12/20/2016    Family History  Problem Relation Age of Onset   CAD Father    Other Mother        passed right before 27- tumor on kidney recent diagnosis   Dementia Mother        progressing rapidly   CAD Paternal Grandfather        45s   CAD Paternal Grandmother        78s    Brain cancer Maternal Grandmother    Lymphoma Sister        b cell   Celiac disease Sister    Diabetes Neg Hx     Social History   Socioeconomic History   Marital status: Married    Spouse name: Not on file   Number of children: Not on file   Years of education: Not on file   Highest education level: Master's degree (e.g., MA, MS, MEng, MEd, MSW, MBA)  Occupational History   Occupation: Engineer, maintenance: [employer]  Tobacco Use   Smoking status: Former    Current packs/day: 0.00    Average packs/day: 1 pack/day for 30.0 years (30.0 ttl pk-yrs)    Types: Cigarettes    Start date: 05/04/1970    Quit date: 05/03/2000    Years since quitting: 23.3   Smokeless tobacco: Never  Vaping Use   Vaping status: Never Used  Substance and Sexual Activity   Alcohol use: Yes    Alcohol/week: 3.0 standard drinks of alcohol    Types: 1 Glasses of wine, 1 Cans of beer, 1 Shots of liquor per week    Comment: 1-2/month   Drug use: No   Sexual activity: Not on file  Other Topics Concern   Not on file  Social History Narrative  Married (wife seen elsewhere- still working in Catering manager 2024). 3 step children, 1 biological. No grandkids.       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      Hobbies: race sail boats, adrenaline related activities   Social Drivers of Corporate investment banker Strain: Low Risk  (07/23/2023)   Overall Financial Resource Strain (CARDIA)    Difficulty of Paying Living Expenses: Not hard at all  Food Insecurity: No Food Insecurity (07/23/2023)   Hunger Vital Sign    Worried About Running Out of Food in the Last Year: Never true    Ran Out of Food in the Last Year: Never true  Transportation Needs: No Transportation Needs (07/23/2023)   PRAPARE - Administrator, Civil Service (Medical): No    Lack of Transportation (Non-Medical): No  Physical Activity: Sufficiently Active (07/23/2023)   Exercise  Vital Sign    Days of Exercise per Week: 5 days    Minutes of Exercise per Session: 60 min  Stress: No Stress Concern Present (07/23/2023)   [employer] of Occupational Health - Occupational Stress Questionnaire    Feeling of Stress: Not at all  Social Connections: Socially Integrated (07/23/2023)   Social Connection and Isolation Panel    Frequency of Communication with Friends and Family: More than three times a week    Frequency of Social Gatherings with Friends and Family: More than three times a week    Attends Religious Services: 1 to 4 times per year    Active Member of Clubs or Organizations: Yes    Attends Banker Meetings: 1 to 4 times per year    Marital Status: Married  Catering manager Violence: Not At Risk (07/23/2023)   Humiliation, Afraid, Rape, and Kick questionnaire    Fear of Current or Ex-Partner: No    Emotionally Abused: No    Physically Abused: No    Sexually Abused: No    Past Surgical History:  Procedure Laterality Date   CARDIAC CATHETERIZATION N/A 01/16/2015   Procedure: Left Heart Cath and Coronary Angiography;  Surgeon: Dorn JINNY Lesches, MD;  Location: [facility] INVASIVE CV LAB;  Service: Cardiovascular;  Laterality: N/A;   CORONARY STENT PLACEMENT     EP IMPLANTABLE DEVICE N/A 01/10/2016   MDT Claria MRI Quad CRTD implanted by Dr Kelsie   INGUINAL HERNIA REPAIR Left 05/22/2020   Procedure: LEFT INGUINAL HERNIA REPAIR WITH MESH;  Surgeon: Vernetta Berg, MD;  Location: [facility] OR;  Service: General;  Laterality: Left;   INSERT / REPLACE / REMOVE PACEMAKER     INSERTION OF MESH Left 05/22/2020   Procedure: INSERTION OF MESH;  Surgeon: Vernetta Berg, MD;  Location: [facility] OR;  Service: General;  Laterality: Left;   IR ANGIOGRAM PELVIS SELECTIVE OR SUPRASELECTIVE  08/28/2022   IR ANGIOGRAM SELECTIVE EACH ADDITIONAL VESSEL  08/28/2022   IR ANGIOGRAM SELECTIVE EACH ADDITIONAL VESSEL  08/28/2022   IR ANGIOGRAM SELECTIVE EACH ADDITIONAL VESSEL  08/28/2022    IR EMBO TUMOR ORGAN ISCHEMIA INFARCT INC GUIDE ROADMAPPING  08/28/2022   IR US  GUIDE VASC ACCESS LEFT  08/28/2022   IR US  GUIDE VASC ACCESS LEFT  08/28/2022   IR US  GUIDE VASC ACCESS LEFT  08/28/2022   IR US  GUIDE VASC ACCESS RIGHT  08/28/2022   TEE WITHOUT CARDIOVERSION N/A 01/26/2015   Procedure: TRANSESOPHAGEAL ECHOCARDIOGRAM (TEE);  Surgeon: Jerel Balding, MD;  Location: [facility] ENDOSCOPY;  Service: Cardiovascular;  Laterality: N/A;   TONSILECTOMY, ADENOIDECTOMY, BILATERAL  MYRINGOTOMY AND TUBES     TONSILLECTOMY     WISDOM TOOTH EXTRACTION     WRIST FRACTURE SURGERY Right 1960   3rd grade     No current facility-administered medications for this encounter.  Current Outpatient Medications:    Blood Glucose Monitoring Suppl (ONETOUCH VERIO) w/Device KIT, 1 Device by Does not apply route daily., Disp: 1 kit, Rfl: 0   carvedilol  (COREG ) 6.25 MG tablet, TAKE 1 TABLET(6.25 MG) BY MOUTH TWICE DAILY, Disp: 180 tablet, Rfl: 2   CINNAMON PO, Take 1 tablet by mouth daily., Disp: , Rfl:    clopidogrel  (PLAVIX ) 75 MG tablet, Take 1 tablet (75 mg total) by mouth daily., Disp: 90 tablet, Rfl: 2   Coenzyme Q10 (COQ10) 200 MG CAPS, Take 200 mg by mouth in the morning., Disp: , Rfl:    Continuous Glucose Sensor (DEXCOM G7 SENSOR) MISC, USE FOR CONTINUOUS BLOOD GLUCOSE MONITORING. REPLACE EVERY 10 DAYS, Disp: 3 each, Rfl: 3   dapagliflozin  propanediol (FARXIGA ) 10 MG TABS tablet, Take 1 tablet (10mg ) by mouth daily before breakfast, Disp: 90 tablet, Rfl: 0   ezetimibe  (ZETIA ) 10 MG tablet, TAKE 1 TABLET(10 MG) BY MOUTH DAILY., Disp: 90 tablet, Rfl: 3   GARLIC PO, Take 1 tablet by mouth daily., Disp: , Rfl:    Glucagon  (GVOKE HYPOPEN  2-PACK) 0.5 MG/0.1ML SOAJ, Use for severe lows, Disp: 0.2 mL, Rfl: 1   glucose blood (ONETOUCH VERIO) test strip, 1 each by Other route 2 (two) times daily. And lancets 2/day, Disp: 200 each, Rfl: 12   HUMALOG  KWIKPEN 100 UNIT/ML KwikPen, INJECT UNDER THE SKIN 7 UNITS EVERY MORNING,  3 UNITS AT LUNCH, AND 7 UNITS AT DINNER., Disp: 30 mL, Rfl: 0   Insulin  Infusion Pump Supplies (TANDEM MOBI [employer] XC KIT) MISC, , Disp: , Rfl:    insulin  lispro (HUMALOG ) 100 UNIT/ML injection, Use 60 units per day via pump., Disp: 10 mL, Rfl: 11   Insulin  Lispro Prot & Lispro (HUMALOG  MIX 50/50 KWIKPEN) (50-50) 100 UNIT/ML Kwikpen, Inject under the skin 7 units in every morning , 3 units at lunch and 7 units at dinner, Disp: 30 mL, Rfl: 11   Insulin  Pen Needle (PEN NEEDLES) 32G X 4 MM MISC, Use to inject insulin  4 times a day, Disp: 100 each, Rfl: 2   levothyroxine  (SYNTHROID ) 125 MCG tablet, Take 1 tablet (125 mcg total) by mouth daily., Disp: 90 tablet, Rfl: 3   Probiotic Product (PROBIOTIC PO), Take 1 capsule by mouth every morning., Disp: , Rfl:    rosuvastatin  (CRESTOR ) 10 MG tablet, Take 1 tablet (10 mg total) by mouth daily., Disp: 90 tablet, Rfl: 3   sacubitril -valsartan  (ENTRESTO ) 49-51 MG, Take 1 tablet by mouth 2 (two) times daily., Disp: 180 tablet, Rfl: 3   tadalafil  (CIALIS ) 5 MG tablet, TAKE 1 TABLET(5 MG) BY MOUTH DAILY, Disp: 90 tablet, Rfl: 3   tamsulosin (FLOMAX) 0.4 MG CAPS capsule, Take 0.4 mg by mouth daily., Disp: , Rfl:    TOUJEO  SOLOSTAR 300 UNIT/ML Solostar Pen, ADMINISTER 38 UNITS UNDER THE SKIN EVERY DAY, Disp: 6 mL, Rfl: 3    Physical Exam: Blood pressure 117/89, pulse 74, temperature 98.1 F (36.7 C), resp. rate 15, height 5' 9 (1.753 m), weight 75.8 kg, SpO2 96%.    Lucid normal neuro Small echymosis on back of head Lungs clear AICD under left clavicle No murmur Abdomen benign No edema Palpable pedal pulses   Labs:   Lab Results  Component Value Date  WBC 11.6 (H) 09/02/2023   HGB 16.3 09/02/2023   HCT 48.0 09/02/2023   MCV 97.3 09/02/2023   PLT 160 09/02/2023    Recent Labs  Lab 09/02/23 1658 09/02/23 1704  NA 138 139  K 4.1 4.0  CL 103 103  CO2 23  --   BUN 21 24*  CREATININE 2.19* 2.30*  CALCIUM  8.6*  --   PROT 6.2*  --    BILITOT 1.4*  --   ALKPHOS 48  --   ALT 23  --   AST 30  --   GLUCOSE 248* 249*   Lab Results  Component Value Date   CKTOTAL 192 12/14/2006   CKMB 2.0 12/14/2006   TROPONINI 0.13 (H) 01/15/2015    Lab Results  Component Value Date   CHOL 136 07/14/2023   CHOL 157 05/01/2022   CHOL 134 10/26/2021   Lab Results  Component Value Date   HDL 71 07/14/2023   HDL 61 05/01/2022   HDL 56 10/26/2021   Lab Results  Component Value Date   LDLCALC 50 07/14/2023   LDLCALC 77 05/01/2022   LDLCALC 63 10/26/2021   Lab Results  Component Value Date   TRIG 70 07/14/2023   TRIG 105 05/01/2022   TRIG 75 10/26/2021   Lab Results  Component Value Date   CHOLHDL 1.9 07/14/2023   CHOLHDL 2.6 05/01/2022   CHOLHDL 2.4 10/26/2021   Lab Results  Component Value Date   LDLDIRECT 85.0 07/11/2017   LDLDIRECT 90.0 02/17/2015   LDLDIRECT 100.0 12/23/2014      Radiology: CT HEAD WO CONTRAST Result Date: 09/02/2023 CLINICAL DATA:  Head trauma, neck trauma, fall. Chest pain this afternoon. Syncopal episode. Fell and hit back of head. EXAM: CT HEAD WITHOUT CONTRAST CT CERVICAL SPINE WITHOUT CONTRAST TECHNIQUE: Multidetector CT imaging of the head and cervical spine was performed following the standard protocol without intravenous contrast. Multiplanar CT image reconstructions of the cervical spine were also generated. RADIATION DOSE REDUCTION: This exam was performed according to the departmental dose-optimization program which includes automated exposure control, adjustment of the mA and/or kV according to patient size and/or use of iterative reconstruction technique. COMPARISON:  CT head 01/24/2015. FINDINGS: CT HEAD FINDINGS Brain: No acute intracranial hemorrhage. No CT evidence of acute infarct. Nonspecific hypoattenuation in the periventricular and subcortical white matter favored to reflect chronic microvascular ischemic changes. Redemonstrated remote infarct in the left parietal  periventricular white matter. Additional small remote infarct in the lateral left cerebellum. No edema, mass effect, or midline shift. The basilar cisterns are patent. Ventricles: The ventricles are normal. Vascular: No hyperdense vessel or unexpected calcification. Skull: No acute or aggressive finding. Orbits: Orbits are symmetric. Sinuses: Mucous retention cyst in the right sphenoid sinus. Other: Mastoid air cells are clear. CT CERVICAL SPINE FINDINGS Alignment: Straightening of the normal cervical lordosis. There is 3 mm anterolisthesis of C3 on C4 likely related to degenerative changes. No facet subluxation or dislocation. Skull base and vertebrae: No acute fracture. No primary bone lesion or focal pathologic process. Soft tissues and spinal canal: No prevertebral fluid or swelling. No visible canal hematoma. Disc levels: Intervertebral disc space narrowing most pronounced at C4-5 through C6-7. Disc osteophyte complexes at multiple levels. No high-grade osseous spinal canal stenosis. Facet arthrosis and uncovertebral hypertrophy throughout the cervical spine. Foraminal stenosis at multiple levels. Upper chest: Negative. Other: None. IMPRESSION: No CT evidence of acute intracranial abnormality. No acute fracture or traumatic malalignment of the cervical spine. Small remote infarcts  in the left periventricular white matter and left cerebellum. Degenerative changes as above. Electronically Signed   By: Donnice Mania M.D.   On: 09/02/2023 17:40   CT CERVICAL SPINE WO CONTRAST Result Date: 09/02/2023 CLINICAL DATA:  Head trauma, neck trauma, fall. Chest pain this afternoon. Syncopal episode. Fell and hit back of head. EXAM: CT HEAD WITHOUT CONTRAST CT CERVICAL SPINE WITHOUT CONTRAST TECHNIQUE: Multidetector CT imaging of the head and cervical spine was performed following the standard protocol without intravenous contrast. Multiplanar CT image reconstructions of the cervical spine were also generated. RADIATION  DOSE REDUCTION: This exam was performed according to the departmental dose-optimization program which includes automated exposure control, adjustment of the mA and/or kV according to patient size and/or use of iterative reconstruction technique. COMPARISON:  CT head 01/24/2015. FINDINGS: CT HEAD FINDINGS Brain: No acute intracranial hemorrhage. No CT evidence of acute infarct. Nonspecific hypoattenuation in the periventricular and subcortical white matter favored to reflect chronic microvascular ischemic changes. Redemonstrated remote infarct in the left parietal periventricular white matter. Additional small remote infarct in the lateral left cerebellum. No edema, mass effect, or midline shift. The basilar cisterns are patent. Ventricles: The ventricles are normal. Vascular: No hyperdense vessel or unexpected calcification. Skull: No acute or aggressive finding. Orbits: Orbits are symmetric. Sinuses: Mucous retention cyst in the right sphenoid sinus. Other: Mastoid air cells are clear. CT CERVICAL SPINE FINDINGS Alignment: Straightening of the normal cervical lordosis. There is 3 mm anterolisthesis of C3 on C4 likely related to degenerative changes. No facet subluxation or dislocation. Skull base and vertebrae: No acute fracture. No primary bone lesion or focal pathologic process. Soft tissues and spinal canal: No prevertebral fluid or swelling. No visible canal hematoma. Disc levels: Intervertebral disc space narrowing most pronounced at C4-5 through C6-7. Disc osteophyte complexes at multiple levels. No high-grade osseous spinal canal stenosis. Facet arthrosis and uncovertebral hypertrophy throughout the cervical spine. Foraminal stenosis at multiple levels. Upper chest: Negative. Other: None. IMPRESSION: No CT evidence of acute intracranial abnormality. No acute fracture or traumatic malalignment of the cervical spine. Small remote infarcts in the left periventricular white matter and left cerebellum.  Degenerative changes as above. Electronically Signed   By: Donnice Mania M.D.   On: 09/02/2023 17:40   CT CHEST ABDOMEN PELVIS W CONTRAST Result Date: 09/02/2023 CLINICAL DATA:  Trauma. EXAM: CT CHEST, ABDOMEN, AND PELVIS WITH CONTRAST TECHNIQUE: Multidetector CT imaging of the chest, abdomen and pelvis was performed following the standard protocol during bolus administration of intravenous contrast. RADIATION DOSE REDUCTION: This exam was performed according to the departmental dose-optimization program which includes automated exposure control, adjustment of the mA and/or kV according to patient size and/or use of iterative reconstruction technique. CONTRAST:  OMNIPAQUE  IOHEXOL  350 MG/ML SOLN COMPARISON:  CT pelvis dated 07/16/2022. FINDINGS: CT CHEST FINDINGS Cardiovascular: There is no cardiomegaly. Small pericardial effusion measuring 4 mm in thickness anterior to the heart. Three vessel coronary vascular calcification. Left pectoral pacemaker device. The thoracic aorta is unremarkable. The origins of the great vessels of the aortic arch and the central pulmonary arteries are patent. Mediastinum/Nodes: No hilar or mediastinal adenopathy. The esophagus is grossly unremarkable. No mediastinal fluid collection. Lungs/Pleura: No focal consolidation, pleural effusion, or pneumothorax. The central airways are patent. Musculoskeletal: No acute osseous pathology. CT ABDOMEN PELVIS FINDINGS No intra-abdominal free air or free fluid. Hepatobiliary: Slight irregularity of the liver contour. Clinical correlation recommended to evaluate for possibility of early cirrhosis. No biliary dilatation. The gallbladder is unremarkable.  Pancreas: Subcentimeter hypodense focus in the head of the pancreas is suboptimally evaluated but may represent a side branch IPMN. No active inflammation. No dilatation of the main pancreatic duct or gland atrophy. Spleen: Normal in size without focal abnormality. Adrenals/Urinary Tract: The  adrenal glands are unremarkable. There is a 2.5 x 1.7 cm complex cyst with solid nodular component versus a solid lesion arising from the lateral interpolar right kidney suspicious for a renal neoplasm. Further evaluation with renal mass protocol MRI is recommended. A 3.5 cm right renal inferior pole cyst. There is no hydronephrosis on either side. There is symmetric enhancement and excretion of contrast by both kidneys. The visualized ureters and urinary bladder appear unremarkable. Stomach/Bowel: There is no bowel obstruction or active inflammation. The appendix is normal. Vascular/Lymphatic: Moderate aortoiliac atherosclerotic disease. The IVC is unremarkable. No portal venous gas. There is no adenopathy. Reproductive: The prostate gland is enlarged measuring 5.6 cm in transverse axial diameter. Other: None Musculoskeletal: No acute or significant osseous findings. IMPRESSION: 1. No acute/traumatic intrathoracic, abdominal, or pelvic pathology. 2. A 2.5 cm lesion in the lateral interpolar right kidney suspicious for a renal neoplasm. Further evaluation with renal mass protocol MRI is recommended. 3. Subcentimeter hypodense focus in the head of the pancreas may represent a side branch IPMN. Attention on follow-up imaging. 4. Enlarged prostate gland. 5.  Aortic Atherosclerosis (ICD10-I70.0). Electronically Signed   By: Vanetta Chou M.D.   On: 09/02/2023 17:36   DG Pelvis Portable Result Date: 09/02/2023 CLINICAL DATA:  Level 1 trauma EXAM: PORTABLE PELVIS 1-2 VIEWS COMPARISON:  None Available. FINDINGS: There is no evidence of pelvic fracture or diastasis. No pelvic bone lesions are seen. There surgical clips in the pelvis. Is IMPRESSION: Negative. Electronically Signed   By: Greig Pique M.D.   On: 09/02/2023 17:07   DG Chest Port 1 View Result Date: 09/02/2023 CLINICAL DATA:  Trauma EXAM: PORTABLE CHEST 1 VIEW COMPARISON:  Chest x-ray 07/01/2022 FINDINGS: Left-sided ICD is present. The heart is mildly  enlarged. The lungs and costophrenic angles are clear. There is no pneumothorax. No acute fractures are seen. IMPRESSION: 1. No active disease. 2. Mild cardiomegaly. Electronically Signed   By: Greig Pique M.D.   On: 09/02/2023 17:06    EKG: A sense V pacing   ASSESSMENT AND PLAN:   Syncope: sounds vasovagal. No evidence for acute dissection , PE or MI. ECG with a sense V pacing He denies AICD firing and ER doctor to have [employer] check device. Will update TTE and monitor.  CHF:  ischemic DCM EF 35-40% euvolemic with no CHF CAD: distant anterior MI no angina troponin negative ECG with V pacing  Renal:  needs f/u MRI for new find of right renal lesion Did not discuss this with patient   Signed: Maude Emmer 09/02/2023, 6:09 PM

## 2023-09-02 NOTE — TOC CAGE-AID Note (Signed)
 Transition of Care [facility]) - CAGE-AID Screening   Patient Details  Name: TEAL BONTRAGER MRN: [id number] Date of Birth: 20-Oct-1950  [facility] CM/SW Contact:    LEBRON ROCKIE ORN, RN Phone Number: [phone] 09/02/2023, 7:41 PM   Clinical Narrative: Pt was an activated level 1 due to having a fall and being on plavix  with a BP of 86/52.  Pt being admitted for syncope workup.  Pt denies any drug or alcohol use.  Screening complete.   CAGE-AID Screening:    Have You Ever Felt You Ought to Cut Down on Your Drinking or Drug Use?: No Have People Annoyed You By Critizing Your Drinking Or Drug Use?: No Have You Felt Bad Or Guilty About Your Drinking Or Drug Use?: No Have You Ever Had a Drink or Used Drugs First Thing In The Morning to Steady Your Nerves or to Get Rid of a Hangover?: No CAGE-AID Score: 0  Substance Abuse Education Offered: No

## 2023-09-02 NOTE — ED Triage Notes (Signed)
 Pt bib [facility] for fall. Pt had burning CP beginning around 1400 today. Pt went to bathroom and had syncopal episode. Pt fell and hit back of head. Pt takes Plavix . Hypotension with EMS and upon arrival. Pt is A&Ox4 and c/o no pain.

## 2023-09-02 NOTE — Progress Notes (Signed)
 Orthopedic Tech Progress Note Patient Details:  John Carrillo Jun 03, 1950 [id number]  Level 2 trauma   Patient ID: John Carrillo, male   DOB: Jun 23, 1950, 73 y.o.   MRN: [id number]  John Carrillo 09/02/2023, 5:04 PM

## 2023-09-02 NOTE — Progress Notes (Signed)
 While back in ED Chaplain noticed pt was back in the room and his wife had arrived.  Chaplain stepped in and spoke briefly with them.  Pt reported he is fine and awaiting discharge.  [place] Chaplain

## 2023-09-02 NOTE — Telephone Encounter (Signed)
 Refill request complete

## 2023-09-02 NOTE — Progress Notes (Signed)
  Carryover admission to the Day Admitter.  I discussed this case with the EDP, Dr. Caron Salt.  Per these discussions:   This is a 73 year old man with history of chronic systolic/diastolic heart failure, pacemaker, type 2 diabetes mellitus, who is being admitted for further evaluation of a single syncopal episode that occurred immediately after the patient had urinated in the toilet at home.  This was associated with preceding dizziness, lightheadedness, diaphoresis, before formally losing consciousness.  The patient believes he hit his head on the sink as a component of this episode of loss of consciousness.  He also notes that he is experiencing some chest discomfort across his chest over the 1 to 2 hours preceding this episode of chest pain.  He denies any ensuing or residual chest pain after the episode of loss of consciousness.  He is on daily Plavix , but otherwise on no blood thinners as an outpatient.  Most recent echocardiogram occurred in July 2023 and demonstrated LVEF 35 to 40% as well as grade 1 diastolic dysfunction.  In the ED today, initial troponin was 16, with repeat value trending up slightly to 22.  Labs also reported to be notable for mild acute kidney injury.  EKG reportedly shows no evidence of acute ischemic changes.   Orders have been placed for interrogation of the patient's pacemaker.  Chest x-ray showed no evidence of acute cardiopulmonary process.  CT head and neck reported showed no evidence of acute process.  CT chest, abdomen, pelvis was reported to show incidental finding of renal mass, but otherwise no acute process.  Per EDP, no report of any flank discomfort.  EDP discussed with on-call cardiology, Dr. Delford, who will formally consult.  Echocardiogram has been ordered.   In the ED, patient has received a 1 L NS bolus.  I have placed an order for observation to cardiac telemetry for further evaluation management of the above.  I have placed some  additional preliminary admit orders via the adult multi-morbid admission order set. I have also ordered further trending of troponin, fall precautions, orthostatic vital signs.  There is an existing order for urinalysis.  I have also ordered morning labs in the form of CMP, CBC, magnesium level.     Eva Pore, DO Hospitalist

## 2023-09-02 NOTE — ED Provider Notes (Signed)
 [place] AT [facility] Provider Note   CSN: [id number] Arrival date & time: 09/02/23  1654     Patient presents with: John Carrillo is a 73 y.o. male.   This is a 73 year old male presenting emergency department as a trauma activation after having a syncopal episode.  hit his head and is on Plavix .  Reports having chest pain for an hour or 2, went to the bathroom and syncopized.  Hit his head on the back of the toilet.  EMS reported hypotension with a systolic blood pressure in the 80s.  Received 500 mL of fluid prior to arrival.  He arrived and complained of minor headache, no other injuries.  Notes that chest pain has resolved.  He had been feeling his normal state of health for the past few days. URI symptoms.  No abdominal pain nausea vomiting.   Fall       Prior to Admission medications   Medication Sig Start Date End Date Taking? Authorizing Provider  Blood Glucose Monitoring Suppl (ONETOUCH VERIO) w/Device KIT 1 Device by Does not apply route daily. 10/02/20   Kassie Mallick, MD  carvedilol  (COREG ) 6.25 MG tablet TAKE 1 TABLET(6.25 MG) BY MOUTH TWICE DAILY 12/30/22   Lesia Ozell Barter, PA-C  CINNAMON PO Take 1 tablet by mouth daily.    [provider]  clopidogrel  (PLAVIX ) 75 MG tablet Take 1 tablet (75 mg total) by mouth daily. 02/10/23   Nishan, Peter C, MD  Coenzyme Q10 (COQ10) 200 MG CAPS Take 200 mg by mouth in the morning.    [provider]  Continuous Glucose Sensor (DEXCOM G7 SENSOR) MISC USE FOR CONTINUOUS BLOOD GLUCOSE MONITORING. REPLACE EVERY 10 DAYS 09/02/23   Thapa, [place], MD  dapagliflozin  propanediol (FARXIGA ) 10 MG TABS tablet Take 1 tablet (10mg ) by mouth daily before breakfast 06/16/23   Thapa, [place], MD  ezetimibe  (ZETIA ) 10 MG tablet TAKE 1 TABLET(10 MG) BY MOUTH DAILY. 08/04/23   Katrinka Garnette KIDD, MD  GARLIC PO Take 1 tablet by mouth daily.    [provider]  Glucagon  (GVOKE HYPOPEN   2-PACK) 0.5 MG/0.1ML SOAJ Use for severe lows 05/15/22   Von [place], MD  glucose blood (ONETOUCH VERIO) test strip 1 each by Other route 2 (two) times daily. And lancets 2/day 10/25/16   Kassie Mallick, MD  HUMALOG  KWIKPEN 100 UNIT/ML KwikPen INJECT UNDER THE SKIN 7 UNITS EVERY MORNING, 3 UNITS AT LUNCH, AND 7 UNITS AT DINNER. 06/30/22   Von [place], MD  Insulin  Infusion Pump Supplies (TANDEM MOBI AUTOSOFT XC KIT) MISC  04/10/23   [provider]  insulin  lispro (HUMALOG ) 100 UNIT/ML injection Use 60 units per day via pump. 03/11/23   Thapa, [place], MD  Insulin  Lispro Prot & Lispro (HUMALOG  MIX 50/50 KWIKPEN) (50-50) 100 UNIT/ML Kwikpen Inject under the skin 7 units in every morning , 3 units at lunch and 7 units at dinner 09/11/22   Von [place], MD  Insulin  Pen Needle (PEN NEEDLES) 32G X 4 MM MISC Use to inject insulin  4 times a day 02/21/22   Von [place], MD  levothyroxine  (SYNTHROID ) 125 MCG tablet Take 1 tablet (125 mcg total) by mouth daily. 04/28/23   Thapa, [place], MD  Probiotic Product (PROBIOTIC PO) Take 1 capsule by mouth every morning.    [provider]  rosuvastatin  (CRESTOR ) 10 MG tablet Take 1 tablet (10 mg total) by mouth daily. 11/11/22   Nishan, Peter C, MD  sacubitril -valsartan  (  ENTRESTO ) 49-51 MG Take 1 tablet by mouth 2 (two) times daily. 10/11/22   Delford Maude BROCKS, MD  tadalafil  (CIALIS ) 5 MG tablet TAKE 1 TABLET(5 MG) BY MOUTH DAILY 05/13/23   Katrinka Garnette KIDD, MD  tamsulosin (FLOMAX) 0.4 MG CAPS capsule Take 0.4 mg by mouth daily. 04/25/22   [provider]  TOUJEO  SOLOSTAR 300 UNIT/ML Solostar Pen ADMINISTER 38 UNITS UNDER THE SKIN EVERY DAY 08/13/22   Von [place], MD    Allergies: Patient has no known allergies.    Review of Systems  Updated Vital Signs BP 99/62   Pulse (!) 58   Temp 98.1 F (36.7 C)   Resp (!) 23   Ht 5' 9 (1.753 m)   Wt 75.8 kg   SpO2 99%   BMI 24.66 kg/m   Physical Exam Vitals and nursing note reviewed.  Constitutional:       General: He is not in acute distress.    Appearance: He is not toxic-appearing.  HENT:     Nose: Nose normal.     Mouth/Throat:     Mouth: Mucous membranes are moist.  Cardiovascular:     Rate and Rhythm: Normal rate and regular rhythm.  Pulmonary:     Effort: Pulmonary effort is normal.     Breath sounds: Normal breath sounds.  Abdominal:     General: Abdomen is flat. There is no distension.     Palpations: Abdomen is soft.     Tenderness: There is no abdominal tenderness. There is no guarding or rebound.  Musculoskeletal:        General: Normal range of motion.     Cervical back: Normal range of motion. No tenderness.  Skin:    General: Skin is warm and dry.     Capillary Refill: Capillary refill takes less than 2 seconds.  Neurological:     Mental Status: He is alert and oriented to person, place, and time.  Psychiatric:        Mood and Affect: Mood normal.        Behavior: Behavior normal.     (all labs ordered are listed, but only abnormal results are displayed) Labs Reviewed  COMPREHENSIVE METABOLIC PANEL WITH GFR - Abnormal; Notable for the following components:      Result Value   Glucose, Bld 248 (*)    Creatinine, Ser 2.19 (*)    Calcium  8.6 (*)    Total Protein 6.2 (*)    Total Bilirubin 1.4 (*)    GFR, Estimated 31 (*)    All other components within normal limits  CBC - Abnormal; Notable for the following components:   WBC 11.6 (*)    All other components within normal limits  CBG MONITORING, ED - Abnormal; Notable for the following components:   Glucose-Capillary 240 (*)    All other components within normal limits  I-STAT CHEM 8, ED - Abnormal; Notable for the following components:   BUN 24 (*)    Creatinine, Ser 2.30 (*)    Glucose, Bld 249 (*)    Calcium , Ion 0.99 (*)    All other components within normal limits  ETHANOL  PROTIME-INR  URINALYSIS, ROUTINE W REFLEX MICROSCOPIC  I-STAT CG4 LACTIC ACID, ED  SAMPLE TO BLOOD BANK  TROPONIN I (HIGH  SENSITIVITY)  TROPONIN I (HIGH SENSITIVITY)    EKG: EKG Interpretation Date/Time:  Tuesday September 02 2023 16:55:22 EDT Ventricular Rate:  72 PR Interval:  178 QRS Duration:  168 QT Interval:  488  QTC Calculation: 535 R Axis:   260  Text Interpretation: Sinus rhythm Consider left atrial enlargement Right bundle branch block Inferior infarct, old Abnormal lateral Q waves Abnrm T, consider ischemia, anterolateral lds Confirmed by Neysa Clap [id number]) on 09/02/2023 7:28:05 PM  Radiology: CT HEAD WO CONTRAST Result Date: 09/02/2023 CLINICAL DATA:  Head trauma, neck trauma, fall. Chest pain this afternoon. Syncopal episode. Fell and hit back of head. EXAM: CT HEAD WITHOUT CONTRAST CT CERVICAL SPINE WITHOUT CONTRAST TECHNIQUE: Multidetector CT imaging of the head and cervical spine was performed following the standard protocol without intravenous contrast. Multiplanar CT image reconstructions of the cervical spine were also generated. RADIATION DOSE REDUCTION: This exam was performed according to the departmental dose-optimization program which includes automated exposure control, adjustment of the mA and/or kV according to patient size and/or use of iterative reconstruction technique. COMPARISON:  CT head 01/24/2015. FINDINGS: CT HEAD FINDINGS Brain: No acute intracranial hemorrhage. No CT evidence of acute infarct. Nonspecific hypoattenuation in the periventricular and subcortical white matter favored to reflect chronic microvascular ischemic changes. Redemonstrated remote infarct in the left parietal periventricular white matter. Additional small remote infarct in the lateral left cerebellum. No edema, mass effect, or midline shift. The basilar cisterns are patent. Ventricles: The ventricles are normal. Vascular: No hyperdense vessel or unexpected calcification. Skull: No acute or aggressive finding. Orbits: Orbits are symmetric. Sinuses: Mucous retention cyst in the right sphenoid sinus. Other: Mastoid  air cells are clear. CT CERVICAL SPINE FINDINGS Alignment: Straightening of the normal cervical lordosis. There is 3 mm anterolisthesis of C3 on C4 likely related to degenerative changes. No facet subluxation or dislocation. Skull base and vertebrae: No acute fracture. No primary bone lesion or focal pathologic process. Soft tissues and spinal canal: No prevertebral fluid or swelling. No visible canal hematoma. Disc levels: Intervertebral disc space narrowing most pronounced at C4-5 through C6-7. Disc osteophyte complexes at multiple levels. No high-grade osseous spinal canal stenosis. Facet arthrosis and uncovertebral hypertrophy throughout the cervical spine. Foraminal stenosis at multiple levels. Upper chest: Negative. Other: None. IMPRESSION: No CT evidence of acute intracranial abnormality. No acute fracture or traumatic malalignment of the cervical spine. Small remote infarcts in the left periventricular white matter and left cerebellum. Degenerative changes as above. Electronically Signed   By: Donnice Mania M.D.   On: 09/02/2023 17:40   CT CERVICAL SPINE WO CONTRAST Result Date: 09/02/2023 CLINICAL DATA:  Head trauma, neck trauma, fall. Chest pain this afternoon. Syncopal episode. Fell and hit back of head. EXAM: CT HEAD WITHOUT CONTRAST CT CERVICAL SPINE WITHOUT CONTRAST TECHNIQUE: Multidetector CT imaging of the head and cervical spine was performed following the standard protocol without intravenous contrast. Multiplanar CT image reconstructions of the cervical spine were also generated. RADIATION DOSE REDUCTION: This exam was performed according to the departmental dose-optimization program which includes automated exposure control, adjustment of the mA and/or kV according to patient size and/or use of iterative reconstruction technique. COMPARISON:  CT head 01/24/2015. FINDINGS: CT HEAD FINDINGS Brain: No acute intracranial hemorrhage. No CT evidence of acute infarct. Nonspecific hypoattenuation in  the periventricular and subcortical white matter favored to reflect chronic microvascular ischemic changes. Redemonstrated remote infarct in the left parietal periventricular white matter. Additional small remote infarct in the lateral left cerebellum. No edema, mass effect, or midline shift. The basilar cisterns are patent. Ventricles: The ventricles are normal. Vascular: No hyperdense vessel or unexpected calcification. Skull: No acute or aggressive finding. Orbits: Orbits are symmetric. Sinuses: Mucous retention cyst in  the right sphenoid sinus. Other: Mastoid air cells are clear. CT CERVICAL SPINE FINDINGS Alignment: Straightening of the normal cervical lordosis. There is 3 mm anterolisthesis of C3 on C4 likely related to degenerative changes. No facet subluxation or dislocation. Skull base and vertebrae: No acute fracture. No primary bone lesion or focal pathologic process. Soft tissues and spinal canal: No prevertebral fluid or swelling. No visible canal hematoma. Disc levels: Intervertebral disc space narrowing most pronounced at C4-5 through C6-7. Disc osteophyte complexes at multiple levels. No high-grade osseous spinal canal stenosis. Facet arthrosis and uncovertebral hypertrophy throughout the cervical spine. Foraminal stenosis at multiple levels. Upper chest: Negative. Other: None. IMPRESSION: No CT evidence of acute intracranial abnormality. No acute fracture or traumatic malalignment of the cervical spine. Small remote infarcts in the left periventricular white matter and left cerebellum. Degenerative changes as above. Electronically Signed   By: Donnice Mania M.D.   On: 09/02/2023 17:40   CT CHEST ABDOMEN PELVIS W CONTRAST Result Date: 09/02/2023 CLINICAL DATA:  Trauma. EXAM: CT CHEST, ABDOMEN, AND PELVIS WITH CONTRAST TECHNIQUE: Multidetector CT imaging of the chest, abdomen and pelvis was performed following the standard protocol during bolus administration of intravenous contrast. RADIATION DOSE  REDUCTION: This exam was performed according to the departmental dose-optimization program which includes automated exposure control, adjustment of the mA and/or kV according to patient size and/or use of iterative reconstruction technique. CONTRAST:  OMNIPAQUE  IOHEXOL  350 MG/ML SOLN COMPARISON:  CT pelvis dated 07/16/2022. FINDINGS: CT CHEST FINDINGS Cardiovascular: There is no cardiomegaly. Small pericardial effusion measuring 4 mm in thickness anterior to the heart. Three vessel coronary vascular calcification. Left pectoral pacemaker device. The thoracic aorta is unremarkable. The origins of the great vessels of the aortic arch and the central pulmonary arteries are patent. Mediastinum/Nodes: No hilar or mediastinal adenopathy. The esophagus is grossly unremarkable. No mediastinal fluid collection. Lungs/Pleura: No focal consolidation, pleural effusion, or pneumothorax. The central airways are patent. Musculoskeletal: No acute osseous pathology. CT ABDOMEN PELVIS FINDINGS No intra-abdominal free air or free fluid. Hepatobiliary: Slight irregularity of the liver contour. Clinical correlation recommended to evaluate for possibility of early cirrhosis. No biliary dilatation. The gallbladder is unremarkable. Pancreas: Subcentimeter hypodense focus in the head of the pancreas is suboptimally evaluated but may represent a side branch IPMN. No active inflammation. No dilatation of the main pancreatic duct or gland atrophy. Spleen: Normal in size without focal abnormality. Adrenals/Urinary Tract: The adrenal glands are unremarkable. There is a 2.5 x 1.7 cm complex cyst with solid nodular component versus a solid lesion arising from the lateral interpolar right kidney suspicious for a renal neoplasm. Further evaluation with renal mass protocol MRI is recommended. A 3.5 cm right renal inferior pole cyst. There is no hydronephrosis on either side. There is symmetric enhancement and excretion of contrast by both  kidneys. The visualized ureters and urinary bladder appear unremarkable. Stomach/Bowel: There is no bowel obstruction or active inflammation. The appendix is normal. Vascular/Lymphatic: Moderate aortoiliac atherosclerotic disease. The IVC is unremarkable. No portal venous gas. There is no adenopathy. Reproductive: The prostate gland is enlarged measuring 5.6 cm in transverse axial diameter. Other: None Musculoskeletal: No acute or significant osseous findings. IMPRESSION: 1. No acute/traumatic intrathoracic, abdominal, or pelvic pathology. 2. A 2.5 cm lesion in the lateral interpolar right kidney suspicious for a renal neoplasm. Further evaluation with renal mass protocol MRI is recommended. 3. Subcentimeter hypodense focus in the head of the pancreas may represent a side branch IPMN. Attention on follow-up imaging.  4. Enlarged prostate gland. 5.  Aortic Atherosclerosis (ICD10-I70.0). Electronically Signed   By: Vanetta Chou M.D.   On: 09/02/2023 17:36   DG Pelvis Portable Result Date: 09/02/2023 CLINICAL DATA:  Level 1 trauma EXAM: PORTABLE PELVIS 1-2 VIEWS COMPARISON:  None Available. FINDINGS: There is no evidence of pelvic fracture or diastasis. No pelvic bone lesions are seen. There surgical clips in the pelvis. Is IMPRESSION: Negative. Electronically Signed   By: Greig Pique M.D.   On: 09/02/2023 17:07   DG Chest Port 1 View Result Date: 09/02/2023 CLINICAL DATA:  Trauma EXAM: PORTABLE CHEST 1 VIEW COMPARISON:  Chest x-ray 07/01/2022 FINDINGS: Left-sided ICD is present. The heart is mildly enlarged. The lungs and costophrenic angles are clear. There is no pneumothorax. No acute fractures are seen. IMPRESSION: 1. No active disease. 2. Mild cardiomegaly. Electronically Signed   By: Greig Pique M.D.   On: 09/02/2023 17:06     .Critical Care  Performed by: Neysa Caron PARAS, DO Authorized by: Neysa Caron PARAS, DO   Critical care provider statement:    Critical care time (minutes):  30    Critical care was necessary to treat or prevent imminent or life-threatening deterioration of the following conditions:  Trauma   Critical care was time spent personally by me on the following activities:  Development of treatment plan with patient or surrogate, discussions with consultants, evaluation of patient's response to treatment, examination of patient, ordering and review of laboratory studies, ordering and review of radiographic studies, ordering and performing treatments and interventions, pulse oximetry, re-evaluation of patient's condition and review of old charts    Medications Ordered in the ED  sodium chloride  0.9 % bolus 1,000 mL (has no administration in time range)  iohexol  (OMNIPAQUE ) 350 MG/ML injection 100 mL (100 mLs Intravenous Contrast Given 09/02/23 1719)    Clinical Course as of 09/02/23 1931  Tue Sep 02, 2023  1817 CT CHEST ABDOMEN PELVIS W CONTRAST IMPRESSION: 1. No acute/traumatic intrathoracic, abdominal, or pelvic pathology. 2. A 2.5 cm lesion in the lateral interpolar right kidney suspicious for a renal neoplasm. Further evaluation with renal mass protocol MRI is recommended. 3. Subcentimeter hypodense focus in the head of the pancreas may represent a side branch IPMN. Attention on follow-up imaging. 4. Enlarged prostate gland. 5.  Aortic Atherosclerosis (ICD10-I70.0).   Electronically Signed   By: Vanetta Chou M.D.   On: 09/02/2023 17:36   [TY]  1817 CT HEAD WO CONTRAST MPRESSION: No CT evidence of acute intracranial abnormality.  No acute fracture or traumatic malalignment of the cervical spine.  Small remote infarcts in the left periventricular white matter and left cerebellum.  Degenerative changes as above.   Electronically Signed   By: Donnice Mania M.D.   On: 09/02/2023 17:40   [TY]    Clinical Course User Index [TY] Neysa Caron PARAS, DO                                 Medical Decision Making This is a 73 year old male  presenting emergency department as a trauma activation after hitting his head while on Plavix  after a syncopal episode.  His EKG appears to be left bundle branch, without ischemic changes.  Initial troponin negative.  Trauma workup negative for acute traumatic injury, but did note incidental renal mass and possible subcentimeter pancreatic mass.  Does have an elevated creatinine, but no metabolic derangements.  Hyperglycemic, but no anion  gap and bicarb normal.  Unlikely DKA.  Minor leukocytosis, suspect reactionary.  Given patient's age and risk factors we will admit for syncopal workup.  Amount and/or Complexity of Data Reviewed External Data Reviewed:     Details: EF on last echo 35-40% Labs: ordered. Decision-making details documented in ED Course.    Details: See above  Radiology: ordered and independent interpretation performed. Decision-making details documented in ED Course.    Details: See ED course ECG/medicine tests:  Decision-making details documented in ED Course. Discussion of management or test interpretation with external provider(s): Trauma team. Hospitalist.   Risk Prescription drug management. Decision regarding hospitalization. Diagnosis or treatment significantly limited by social determinants of health.       Final diagnoses:  Syncope, unspecified syncope type    ED Discharge Orders     None          Neysa Caron PARAS, DO 09/02/23 1931

## 2023-09-03 ENCOUNTER — Observation Stay (HOSPITAL_COMMUNITY)

## 2023-09-03 DIAGNOSIS — R55 Syncope and collapse: Secondary | ICD-10-CM

## 2023-09-03 LAB — GLUCOSE, CAPILLARY
Glucose-Capillary: 121 mg/dL — ABNORMAL HIGH (ref 70–99)
Glucose-Capillary: 159 mg/dL — ABNORMAL HIGH (ref 70–99)

## 2023-09-03 LAB — COMPREHENSIVE METABOLIC PANEL WITH GFR
ALT: 23 U/L (ref 0–44)
AST: 22 U/L (ref 15–41)
Albumin: 3.1 g/dL — ABNORMAL LOW (ref 3.5–5.0)
Alkaline Phosphatase: 49 U/L (ref 38–126)
Anion gap: 6 (ref 5–15)
BUN: 20 mg/dL (ref 8–23)
CO2: 25 mmol/L (ref 22–32)
Calcium: 8.4 mg/dL — ABNORMAL LOW (ref 8.9–10.3)
Chloride: 109 mmol/L (ref 98–111)
Creatinine, Ser: 1.81 mg/dL — ABNORMAL HIGH (ref 0.61–1.24)
GFR, Estimated: 39 mL/min — ABNORMAL LOW (ref >60)
Glucose, Bld: 143 mg/dL — ABNORMAL HIGH (ref 70–99)
Potassium: 3.6 mmol/L (ref 3.5–5.1)
Sodium: 140 mmol/L (ref 135–145)
Total Bilirubin: 0.9 mg/dL (ref 0.0–1.2)
Total Protein: 5.8 g/dL — ABNORMAL LOW (ref 6.5–8.1)

## 2023-09-03 LAB — CBC WITH DIFFERENTIAL/PLATELET
Abs Immature Granulocytes: 0.02 K/uL (ref 0.00–0.07)
Basophils Absolute: 0 K/uL (ref 0.0–0.1)
Basophils Relative: 1 %
Eosinophils Absolute: 0.2 K/uL (ref 0.0–0.5)
Eosinophils Relative: 3 %
HCT: 45 % (ref 39.0–52.0)
Hemoglobin: 15.1 g/dL (ref 13.0–17.0)
Immature Granulocytes: 0 %
Lymphocytes Relative: 32 %
Lymphs Abs: 1.8 K/uL (ref 0.7–4.0)
MCH: 33 pg (ref 26.0–34.0)
MCHC: 33.6 g/dL (ref 30.0–36.0)
MCV: 98.5 fL (ref 80.0–100.0)
Monocytes Absolute: 0.9 K/uL (ref 0.1–1.0)
Monocytes Relative: 17 %
Neutro Abs: 2.7 K/uL (ref 1.7–7.7)
Neutrophils Relative %: 47 %
Platelets: 142 K/uL — ABNORMAL LOW (ref 150–400)
RBC: 4.57 MIL/uL (ref 4.22–5.81)
RDW: 12.7 % (ref 11.5–15.5)
WBC: 5.7 K/uL (ref 4.0–10.5)
nRBC: 0 % (ref 0.0–0.2)

## 2023-09-03 LAB — ECHOCARDIOGRAM COMPLETE
Area-P 1/2: 6.27 cm2
Calc EF: 41.6 %
Height: 69 in
S' Lateral: 3.3 cm
Single Plane A2C EF: 29.4 %
Single Plane A4C EF: 51.8 %
Weight: 2672 [oz_av]

## 2023-09-03 LAB — BRAIN NATRIURETIC PEPTIDE: B Natriuretic Peptide: 147.5 pg/mL — ABNORMAL HIGH (ref 0.0–100.0)

## 2023-09-03 LAB — TROPONIN I (HIGH SENSITIVITY): Troponin I (High Sensitivity): 31 ng/L — ABNORMAL HIGH (ref <18)

## 2023-09-03 LAB — MAGNESIUM: Magnesium: 2.3 mg/dL (ref 1.7–2.4)

## 2023-09-03 MED ORDER — CLOPIDOGREL BISULFATE 75 MG PO TABS: 75.0000 mg | ORAL_TABLET | Freq: Every day | ORAL | Status: DC

## 2023-09-03 MED ORDER — INSULIN PUMP
Freq: Three times a day (TID) | SUBCUTANEOUS | Status: DC
  Filled 2023-09-03: qty 1

## 2023-09-03 MED ORDER — EZETIMIBE 10 MG PO TABS: 10.0000 mg | ORAL_TABLET | Freq: Every day | ORAL | Status: DC

## 2023-09-03 MED ORDER — LEVOTHYROXINE SODIUM 25 MCG PO TABS: 125.0000 ug | ORAL_TABLET | Freq: Every day | ORAL | Status: DC

## 2023-09-03 MED ORDER — ROSUVASTATIN CALCIUM 5 MG PO TABS: 10.0000 mg | ORAL_TABLET | Freq: Every day | ORAL | Status: DC

## 2023-09-03 NOTE — Inpatient Diabetes Management (Signed)
 Inpatient Diabetes Program Recommendations  AACE/ADA: New Consensus Statement on Inpatient Glycemic Control (2025)  Target Ranges:  Prepandial:   less than 140 mg/dL      Peak postprandial:   less than 180 mg/dL (1-2 hours)      Critically ill patients:  140 - 180 mg/dL   Lab Results  Component Value Date   GLUCAP 159 (H) 09/03/2023   HGBA1C 7.2 (A) 07/16/2023    Review of Glycemic Control  Latest Reference Range & Units 09/02/23 16:56 09/02/23 22:53 09/03/23 06:40 09/03/23 11:02  Glucose-Capillary 70 - 99 mg/dL 759 (H) 825 (H) 878 (H) 159 (H)   Diabetes history: DM 1 (positive antibodies) Insulin  Pump setting:  Basal (per visit in March of 2025) MN-0.500u/hour 12PM-  0.600  6PM-0.500   Bolus CHO Ratio (1unit:CHO) MN-1:15   Correction/Sensitivity: MN-1:50   Target: 110    Active insulin  time: 5 hours   Current orders for Inpatient glycemic control:  Insulin  pump  Inpatient Diabetes Program Recommendations:   Patient is currently wearing insulin  pump.  Consider d/c of Novolog  correction.   Orders in place for insulin  pump.   Thanks,  Randall Bullocks, RN, BC-ADM Inpatient Diabetes Coordinator Pager [phone]  (8a-5p)

## 2023-09-03 NOTE — Progress Notes (Signed)
 The patient is admitted from ED  to 3 E 06 in stable condition. He's A & O x 4. Denied any acute pain . Full assessment to epic completed. Will continue to monitor.

## 2023-09-03 NOTE — Discharge Summary (Signed)
 Physician Discharge Summary  [name]:[id number] DOB: March 27, 1950 DOA: 09/02/2023  PCP: Katrinka Garnette KIDD, MD  Admit date: 09/02/2023 Discharge date: 09/03/2023  Time spent: 40 minutes  Recommendations for Outpatient Follow-up:  Follow outpatient CBC/CMP  Follow with cardiology outpatient  Follow blood pressure outpatient, repeat creatinine  Chest pain, burning, unclear cause - follow outpatient, additional w/u as indicated 2.5 cm renal lesion, follow with urology outpatient for imaging Possible IPMN, follow with PCP vs endocrine   Discharge Diagnoses:  Principal Problem:   Syncope   Discharge Condition: stable  Diet recommendation: diabetic  Filed Weights   09/02/23 1656  Weight: 75.8 kg    History of present illness:   ANTOINO Carrillo is John Carrillo 73 y.o. male with medical history significant of CAD, HFrEF, CRT-AICD, HTN, hypothyroidism, T1DM and other medical issues here after an episode of syncope.   Workup has been reassuring.  Seen by cards, concern for vasovagal syncope.  Holding entresto  at discharge, creatinine not yet back to normal.  Needs outpatient follow up of incidental findings.    Hospital Course:  Assessment and Plan:  Syncope Fall Occurred after episode of CP.  Was hypotensive (SBP in 90's) at presentation.  Creatinine elevated, suggesting possible hypovolemia/dehydration.  Diaphoresis prior to event as well as using bathroom.  Suspect vagal episode (given occurrence after using bathroom) vs orthostasis with hypotension/elevated creatinine (?dehydrated after workout)? EKG paced Troponins mildly elevated, but flat CT chest abdomen pelvis with contrast without acute findings Echo stable from prior  Orthostatics negative Interrogation of pacemaker, no episodes I saw in report Appreciate cardiology eval, ? Vasovagal syncope - ok for discharge.  Given AKI, will hold entresto  for 3 days at discharge, follow outpatient.  Seen by trauma surgery for his  fall, cleared from trauma perspective   Chest Pain [place] it felt like prior episode of pericarditis As above, trops flat.  Now has completely resolved.  Described burning through chest/back/arms - consider neuropathic pain, consider spine imaging? (But no back/neck pain and symptoms resolved, consider outpatient) Seen by cards Echo as below Unclear what this burning pain represented, follow outpatient, return for recurrent symptoms   Acute Kidney Injury Creatinine improving after IVF Continue to entresto  for 3 days CT without hydro  Needs repeat labs within 1 week   HFrEF  CRT-AICD Echo 2023 with EF 35-40% Repeat echo stable Continue coreg .  Continue farxiga .  Hold entresto  x3 days, then resume.     T1DM Continue home meds   CAD Dyslipidemia On plavix , zetia , crestor    Hypothyroidism Synthroid    Thrombocytopenia Noted   2.5 cmlesion in lateral interpolar R kidney concerning for renal neoplasm Needs renal protocol MRI - can be outpatient Discussed with urology, they'll arrange outpatient follow up   Hypodense focus in head of pancreas concerning for IPMN Consider outpatient MRI pancreas, follow with PCP or endocrine (discussed with patient)      Procedures: Echo IMPRESSIONS     1. Apex thin and dyskinetic, consistent with prior LAD infarct. . Left  ventricular ejection fraction, by estimation, is 40 to 45%. The left  ventricle has mildly decreased function. The left ventricle demonstrates  regional wall motion abnormalities (see  scoring diagram/findings for description). Left ventricular diastolic  parameters are consistent with Grade I diastolic dysfunction (impaired  relaxation).   2. Right ventricular systolic function is normal. The right ventricular  size is mildly enlarged. There is normal pulmonary artery systolic  pressure.   3. Left atrial size was mildly dilated.  4. The mitral valve is normal in structure. No evidence of mitral valve   regurgitation. No evidence of mitral stenosis.   5. The aortic valve is normal in structure. Aortic valve regurgitation is  not visualized. No aortic stenosis is present.   Comparison(s): No significant change from prior study.   Consultations: cardiology  Discharge Exam: Vitals:   09/03/23 0729 09/03/23 1103  BP: 135/83 126/87  Pulse: 60 60  Resp: 18 18  Temp: 97.7 F (36.5 C) 97.7 F (36.5 C)  SpO2: 100% 98%   See H&P, called to discuss d/c plan  Discharge Instructions   Discharge Instructions     Call MD for:  difficulty breathing, headache or visual disturbances   Complete by: As directed    Call MD for:  extreme fatigue   Complete by: As directed    Call MD for:  hives   Complete by: As directed    Call MD for:  persistant dizziness or light-headedness   Complete by: As directed    Call MD for:  persistant nausea and vomiting   Complete by: As directed    Call MD for:  redness, tenderness, or signs of infection (pain, swelling, redness, odor or green/yellow discharge around incision site)   Complete by: As directed    Call MD for:  severe uncontrolled pain   Complete by: As directed    Call MD for:  temperature >100.4   Complete by: As directed    Diet - low sodium heart healthy   Complete by: As directed    Discharge instructions   Complete by: As directed    You were seen after an episode of syncope (fainting).  It's not immediately clear what caused this, but your kidney function was down so you were probably dehydrated which could have predisposed you to this spell.  In addition, you had just used the bathroom, so you may have been predisposed to Kaladin Noseworthy vagal event.    Your workup has been reassuring so far.  Your interrogation of your defibrillator was reassuring.  Your echo was stable.  You were seen by cardiology who is comfortable with you discharging home.    Due to your low blood pressure at presentation and your lower kidney function, stop your  entresto .  You can resume this in 3 days.  Follow with your PCP and cardiologist for repeat labs to make sure your kidney function gets back to normal.    It's not clear to me what the burning in your chest and back was from.  Burning is often nerve pain.  If you have continued issues with this or neck pain or back pain it may be worth additional imaging of your spine.  I made urology aware of your renal mass.  This can be followed outpatient.  They'll arrange Promise Weldin follow up appointment and imaging for you.    You also had Hazelee Harbold lesion on the pancreas.  This can be followed up with repeat imaging by your PCP as an outpatient.    Return for new, recurrent, or worsening symptoms.  Please ask your PCP to request records from this hospitalization so they know what was done and what the next steps will be.   Increase activity slowly   Complete by: As directed       Allergies as of 09/03/2023   No Known Allergies      Medication List     PAUSE taking these medications    Entresto  49-51 MG Wait  to take this until: September 06, 2023 If you are asymptomatic and having no lightheadeness or dizziness, resume your entresto  on Saturday.  If you have recurrent lightheadedness after restarting this, hold until you can follow with your PCP or cardiologist for repeat labs and follow up blood pressure in the office. Generic drug: sacubitril -valsartan  Take 1 tablet by mouth 2 (two) times daily.       STOP taking these medications    tamsulosin 0.4 MG Caps capsule Commonly known as: FLOMAX       TAKE these medications    carvedilol  6.25 MG tablet Commonly known as: COREG  TAKE 1 TABLET(6.25 MG) BY MOUTH TWICE DAILY   CINNAMON PO Take 1 tablet by mouth daily.   clopidogrel  75 MG tablet Commonly known as: PLAVIX  Take 1 tablet (75 mg total) by mouth daily.   CoQ10 200 MG Caps Take 200 mg by mouth in the morning.   dapagliflozin  propanediol 10 MG Tabs tablet Commonly known as: Farxiga  Take 1  tablet (10mg ) by mouth daily before breakfast   Dexcom G7 Sensor Misc USE FOR CONTINUOUS BLOOD GLUCOSE MONITORING. REPLACE EVERY 10 DAYS   ezetimibe  10 MG tablet Commonly known as: ZETIA  TAKE 1 TABLET(10 MG) BY MOUTH DAILY.   GARLIC PO Take 1 tablet by mouth daily.   glucose blood test strip Commonly known as: OneTouch Verio 1 each by Other route 2 (two) times daily. And lancets 2/day   Gvoke HypoPen  2-Pack 0.5 MG/0.1ML Soaj Generic drug: Glucagon  Use for severe lows   HumaLOG  KwikPen 100 UNIT/ML KwikPen Generic drug: insulin  lispro INJECT UNDER THE SKIN 7 UNITS EVERY MORNING, 3 UNITS AT LUNCH, AND 7 UNITS AT DINNER.   insulin  lispro 100 UNIT/ML injection Commonly known as: HumaLOG  Use 60 units per day via pump.   HumaLOG  Mix 50/50 KwikPen (50-50) 100 UNIT/ML KwikPen Generic drug: Insulin  Lispro Prot & Lispro Inject under the skin 7 units in every morning , 3 units at lunch and 7 units at dinner   levothyroxine  125 MCG tablet Commonly known as: SYNTHROID  Take 1 tablet (125 mcg total) by mouth daily.   OneTouch Verio w/Device Kit 1 Device by Does not apply route daily.   Pen Needles 32G X 4 MM Misc Use to inject insulin  4 times Miko Sirico day   PROBIOTIC PO Take 1 capsule by mouth every morning.   rosuvastatin  10 MG tablet Commonly known as: CRESTOR  Take 1 tablet (10 mg total) by mouth daily.   tadalafil  5 MG tablet Commonly known as: CIALIS  TAKE 1 TABLET(5 MG) BY MOUTH DAILY   Tandem Mobi AutoSoft XC Kit Misc   Toujeo  SoloStar 300 UNIT/ML Solostar Pen Generic drug: insulin  glargine (1 Unit Dial ) ADMINISTER 38 UNITS UNDER THE SKIN EVERY DAY       No Known Allergies  Follow-up Information     Katrinka Garnette KIDD, MD Follow up.   Specialty: Family Medicine Contact information: [address] [place] [address] [phone]         [facility] Follow up.   Why: they'll arrange outpatient follow up for the 2.5 cm renal  lesion Contact information: [address] [place]  [address] [phone]                 The results of significant diagnostics from this hospitalization (including imaging, microbiology, ancillary and laboratory) are listed below for reference.    Significant Diagnostic Studies: ECHOCARDIOGRAM COMPLETE Result Date: 09/03/2023    ECHOCARDIOGRAM REPORT  Patient Name:   John Carrillo Date of Exam: 09/03/2023 Medical Rec #:  [id number]        Height:       69.0 in Accession #:    [id number]       Weight:       167.0 lb Date of Birth:  1950-12-30        BSA:          1.914 m Patient Age:    73 years         BP:           130/73 mmHg Patient Gender: M                HR:           60 bpm. Exam Location:  Inpatient Procedure: 2D Echo, Cardiac Doppler and Color Doppler (Both Spectral and Color            Flow Doppler were utilized during procedure). Indications:    Syncope  History:        Patient has prior history of Echocardiogram examinations. Risk                 Factors:Hypertension.  Sonographer:    Vella Key Referring Phys: MAUDE JAYSON EMMER IMPRESSIONS  1. Apex thin and dyskinetic, consistent with prior LAD infarct. . Left ventricular ejection fraction, by estimation, is 40 to 45%. The left ventricle has mildly decreased function. The left ventricle demonstrates regional wall motion abnormalities (see scoring diagram/findings for description). Left ventricular diastolic parameters are consistent with Grade I diastolic dysfunction (impaired relaxation).  2. Right ventricular systolic function is normal. The right ventricular size is mildly enlarged. There is normal pulmonary artery systolic pressure.  3. Left atrial size was mildly dilated.  4. The mitral valve is normal in structure. No evidence of mitral valve regurgitation. No evidence of mitral stenosis.  5. The aortic valve is normal in structure. Aortic valve regurgitation is not visualized. No aortic stenosis is present.  Comparison(s): No significant change from prior study. FINDINGS  Left Ventricle: Apex thin and dyskinetic, consistent with prior LAD infarct. Left ventricular ejection fraction, by estimation, is 40 to 45%. The left ventricle has mildly decreased function. The left ventricle demonstrates regional wall motion abnormalities. The left ventricular internal cavity size was normal in size. There is no left ventricular hypertrophy. Left ventricular diastolic parameters are consistent with Grade I diastolic dysfunction (impaired relaxation). Right Ventricle: The right ventricular size is mildly enlarged. No increase in right ventricular wall thickness. Right ventricular systolic function is normal. There is normal pulmonary artery systolic pressure. Left Atrium: Left atrial size was mildly dilated. Right Atrium: Right atrial size was normal in size. Pericardium: There is no evidence of pericardial effusion. Mitral Valve: The mitral valve is normal in structure. No evidence of mitral valve regurgitation. No evidence of mitral valve stenosis. Tricuspid Valve: The tricuspid valve is normal in structure. Tricuspid valve regurgitation is mild . No evidence of tricuspid stenosis. Aortic Valve: The aortic valve is normal in structure. Aortic valve regurgitation is not visualized. No aortic stenosis is present. Pulmonic Valve: The pulmonic valve was normal in structure. Pulmonic valve regurgitation is not visualized. No evidence of pulmonic stenosis. Aorta: The aortic root is normal in size and structure. Venous: The inferior vena cava was not well visualized. IAS/Shunts: No atrial level shunt detected by color flow Doppler. Additional Comments: Lianna Sitzmann device lead is visualized.  LEFT VENTRICLE PLAX 2D LVIDd:  5.20 cm      Diastology LVIDs:         3.30 cm      LV e' medial:    6.53 cm/s LV PW:         1.00 cm      LV E/e' medial:  10.3 LV IVS:        1.20 cm      LV e' lateral:   5.66 cm/s LVOT diam:     1.20 cm      LV E/e'  lateral: 11.9 LV SV:         28 LV SV Index:   14 LVOT Area:     1.13 cm  LV Volumes (MOD) LV vol d, MOD A2C: 124.0 ml LV vol d, MOD A4C: 166.0 ml LV vol s, MOD A2C: 87.5 ml LV vol s, MOD A4C: 80.0 ml LV SV MOD A2C:     36.5 ml LV SV MOD A4C:     166.0 ml LV SV MOD BP:      59.7 ml RIGHT VENTRICLE RV Basal diam:  4.00 cm RV S prime:     11.30 cm/s TAPSE (M-mode): 1.4 cm LEFT ATRIUM             Index        RIGHT ATRIUM           Index LA diam:        3.50 cm 1.83 cm/m   RA Area:     18.40 cm LA Vol (A2C):   54.5 ml 28.48 ml/m  RA Volume:   55.10 ml  28.79 ml/m LA Vol (A4C):   70.0 ml 36.58 ml/m LA Biplane Vol: 67.1 ml 35.07 ml/m  AORTIC VALVE LVOT Vmax:   96.30 cm/s LVOT Vmean:  71.300 cm/s LVOT VTI:    0.245 m  AORTA Ao Root diam: 3.70 cm Ao Asc diam:  3.30 cm MITRAL VALVE               TRICUSPID VALVE MV Area (PHT): 6.27 cm    TV Peak grad:   16.8 mmHg MV Decel Time: 121 msec    TV Vmax:        2.05 m/s MV E velocity: 67.30 cm/s MV Camron Monday velocity: 99.30 cm/s  SHUNTS MV E/Lynzie Cliburn ratio:  0.68        Systemic VTI:  0.24 m                            Systemic Diam: 1.20 cm Morene Brownie Electronically signed by Morene Brownie Signature Date/Time: 09/03/2023/12:14:16 PM    Final    CT HEAD WO CONTRAST Result Date: 09/02/2023 CLINICAL DATA:  Head trauma, neck trauma, fall. Chest pain this afternoon. Syncopal episode. Fell and hit back of head. EXAM: CT HEAD WITHOUT CONTRAST CT CERVICAL SPINE WITHOUT CONTRAST TECHNIQUE: Multidetector CT imaging of the head and cervical spine was performed following the standard protocol without intravenous contrast. Multiplanar CT image reconstructions of the cervical spine were also generated. RADIATION DOSE REDUCTION: This exam was performed according to the departmental dose-optimization program which includes automated exposure control, adjustment of the mA and/or kV according to patient size and/or use of iterative reconstruction technique. COMPARISON:  CT head 01/24/2015.  FINDINGS: CT HEAD FINDINGS Brain: No acute intracranial hemorrhage. No CT evidence of acute infarct. Nonspecific hypoattenuation in the periventricular and subcortical white matter favored to reflect chronic microvascular ischemic changes. Redemonstrated  remote infarct in the left parietal periventricular white matter. Additional small remote infarct in the lateral left cerebellum. No edema, mass effect, or midline shift. The basilar cisterns are patent. Ventricles: The ventricles are normal. Vascular: No hyperdense vessel or unexpected calcification. Skull: No acute or aggressive finding. Orbits: Orbits are symmetric. Sinuses: Mucous retention cyst in the right sphenoid sinus. Other: Mastoid air cells are clear. CT CERVICAL SPINE FINDINGS Alignment: Straightening of the normal cervical lordosis. There is 3 mm anterolisthesis of C3 on C4 likely related to degenerative changes. No facet subluxation or dislocation. Skull base and vertebrae: No acute fracture. No primary bone lesion or focal pathologic process. Soft tissues and spinal canal: No prevertebral fluid or swelling. No visible canal hematoma. Disc levels: Intervertebral disc space narrowing most pronounced at C4-5 through C6-7. Disc osteophyte complexes at multiple levels. No high-grade osseous spinal canal stenosis. Facet arthrosis and uncovertebral hypertrophy throughout the cervical spine. Foraminal stenosis at multiple levels. Upper chest: Negative. Other: None. IMPRESSION: No CT evidence of acute intracranial abnormality. No acute fracture or traumatic malalignment of the cervical spine. Small remote infarcts in the left periventricular white matter and left cerebellum. Degenerative changes as above. Electronically Signed   By: Donnice Mania M.D.   On: 09/02/2023 17:40   CT CERVICAL SPINE WO CONTRAST Result Date: 09/02/2023 CLINICAL DATA:  Head trauma, neck trauma, fall. Chest pain this afternoon. Syncopal episode. Fell and hit back of head. EXAM: CT  HEAD WITHOUT CONTRAST CT CERVICAL SPINE WITHOUT CONTRAST TECHNIQUE: Multidetector CT imaging of the head and cervical spine was performed following the standard protocol without intravenous contrast. Multiplanar CT image reconstructions of the cervical spine were also generated. RADIATION DOSE REDUCTION: This exam was performed according to the departmental dose-optimization program which includes automated exposure control, adjustment of the mA and/or kV according to patient size and/or use of iterative reconstruction technique. COMPARISON:  CT head 01/24/2015. FINDINGS: CT HEAD FINDINGS Brain: No acute intracranial hemorrhage. No CT evidence of acute infarct. Nonspecific hypoattenuation in the periventricular and subcortical white matter favored to reflect chronic microvascular ischemic changes. Redemonstrated remote infarct in the left parietal periventricular white matter. Additional small remote infarct in the lateral left cerebellum. No edema, mass effect, or midline shift. The basilar cisterns are patent. Ventricles: The ventricles are normal. Vascular: No hyperdense vessel or unexpected calcification. Skull: No acute or aggressive finding. Orbits: Orbits are symmetric. Sinuses: Mucous retention cyst in the right sphenoid sinus. Other: Mastoid air cells are clear. CT CERVICAL SPINE FINDINGS Alignment: Straightening of the normal cervical lordosis. There is 3 mm anterolisthesis of C3 on C4 likely related to degenerative changes. No facet subluxation or dislocation. Skull base and vertebrae: No acute fracture. No primary bone lesion or focal pathologic process. Soft tissues and spinal canal: No prevertebral fluid or swelling. No visible canal hematoma. Disc levels: Intervertebral disc space narrowing most pronounced at C4-5 through C6-7. Disc osteophyte complexes at multiple levels. No high-grade osseous spinal canal stenosis. Facet arthrosis and uncovertebral hypertrophy throughout the cervical spine.  Foraminal stenosis at multiple levels. Upper chest: Negative. Other: None. IMPRESSION: No CT evidence of acute intracranial abnormality. No acute fracture or traumatic malalignment of the cervical spine. Small remote infarcts in the left periventricular white matter and left cerebellum. Degenerative changes as above. Electronically Signed   By: Donnice Mania M.D.   On: 09/02/2023 17:40   CT CHEST ABDOMEN PELVIS W CONTRAST Result Date: 09/02/2023 CLINICAL DATA:  Trauma. EXAM: CT CHEST, ABDOMEN, AND PELVIS WITH CONTRAST  TECHNIQUE: Multidetector CT imaging of the chest, abdomen and pelvis was performed following the standard protocol during bolus administration of intravenous contrast. RADIATION DOSE REDUCTION: This exam was performed according to the departmental dose-optimization program which includes automated exposure control, adjustment of the mA and/or kV according to patient size and/or use of iterative reconstruction technique. CONTRAST:  OMNIPAQUE  IOHEXOL  350 MG/ML SOLN COMPARISON:  CT pelvis dated 07/16/2022. FINDINGS: CT CHEST FINDINGS Cardiovascular: There is no cardiomegaly. Small pericardial effusion measuring 4 mm in thickness anterior to the heart. Three vessel coronary vascular calcification. Left pectoral pacemaker device. The thoracic aorta is unremarkable. The origins of the great vessels of the aortic arch and the central pulmonary arteries are patent. Mediastinum/Nodes: No hilar or mediastinal adenopathy. The esophagus is grossly unremarkable. No mediastinal fluid collection. Lungs/Pleura: No focal consolidation, pleural effusion, or pneumothorax. The central airways are patent. Musculoskeletal: No acute osseous pathology. CT ABDOMEN PELVIS FINDINGS No intra-abdominal free air or free fluid. Hepatobiliary: Slight irregularity of the liver contour. Clinical correlation recommended to evaluate for possibility of early cirrhosis. No biliary dilatation. The gallbladder is unremarkable.  Pancreas: Subcentimeter hypodense focus in the head of the pancreas is suboptimally evaluated but may represent Kire Ferg side branch IPMN. No active inflammation. No dilatation of the main pancreatic duct or gland atrophy. Spleen: Normal in size without focal abnormality. Adrenals/Urinary Tract: The adrenal glands are unremarkable. There is Azara Gemme 2.5 x 1.7 cm complex cyst with solid nodular component versus Brent Noto solid lesion arising from the lateral interpolar right kidney suspicious for Breean Nannini renal neoplasm. Further evaluation with renal mass protocol MRI is recommended. Cashae Weich 3.5 cm right renal inferior pole cyst. There is no hydronephrosis on either side. There is symmetric enhancement and excretion of contrast by both kidneys. The visualized ureters and urinary bladder appear unremarkable. Stomach/Bowel: There is no bowel obstruction or active inflammation. The appendix is normal. Vascular/Lymphatic: Moderate aortoiliac atherosclerotic disease. The IVC is unremarkable. No portal venous gas. There is no adenopathy. Reproductive: The prostate gland is enlarged measuring 5.6 cm in transverse axial diameter. Other: None Musculoskeletal: No acute or significant osseous findings. IMPRESSION: 1. No acute/traumatic intrathoracic, abdominal, or pelvic pathology. 2. Alesi Zachery 2.5 cm lesion in the lateral interpolar right kidney suspicious for Trask Vosler renal neoplasm. Further evaluation with renal mass protocol MRI is recommended. 3. Subcentimeter hypodense focus in the head of the pancreas may represent Kemontae Dunklee side branch IPMN. Attention on follow-up imaging. 4. Enlarged prostate gland. 5.  Aortic Atherosclerosis (ICD10-I70.0). Electronically Signed   By: Vanetta Chou M.D.   On: 09/02/2023 17:36   DG Pelvis Portable Result Date: 09/02/2023 CLINICAL DATA:  Level 1 trauma EXAM: PORTABLE PELVIS 1-2 VIEWS COMPARISON:  None Available. FINDINGS: There is no evidence of pelvic fracture or diastasis. No pelvic bone lesions are seen. There surgical clips in the  pelvis. Is IMPRESSION: Negative. Electronically Signed   By: Greig Pique M.D.   On: 09/02/2023 17:07   DG Chest Port 1 View Result Date: 09/02/2023 CLINICAL DATA:  Trauma EXAM: PORTABLE CHEST 1 VIEW COMPARISON:  Chest x-ray 07/01/2022 FINDINGS: Left-sided ICD is present. The heart is mildly enlarged. The lungs and costophrenic angles are clear. There is no pneumothorax. No acute fractures are seen. IMPRESSION: 1. No active disease. 2. Mild cardiomegaly. Electronically Signed   By: Greig Pique M.D.   On: 09/02/2023 17:06    Microbiology: No results found for this or any previous visit (from the past 240 hours).   Labs: Basic Metabolic Panel: Recent Labs  Lab 09/02/23 1658 09/02/23 1704 09/03/23 0539  NA 138 139 140  K 4.1 4.0 3.6  CL 103 103 109  CO2 23  --  25  GLUCOSE 248* 249* 143*  BUN 21 24* 20  CREATININE 2.19* 2.30* 1.81*  CALCIUM  8.6*  --  8.4*  MG  --   --  2.3   Liver Function Tests: Recent Labs  Lab 09/02/23 1658 09/03/23 0539  AST 30 22  ALT 23 23  ALKPHOS 48 49  BILITOT 1.4* 0.9  PROT 6.2* 5.8*  ALBUMIN 3.5 3.1*   No results for input(s): LIPASE, AMYLASE in the last 168 hours. No results for input(s): AMMONIA in the last 168 hours. CBC: Recent Labs  Lab 09/02/23 1658 09/02/23 1704 09/03/23 0539  WBC 11.6*  --  5.7  NEUTROABS  --   --  2.7  HGB 15.6 16.3 15.1  HCT 46.3 48.0 45.0  MCV 97.3  --  98.5  PLT 160  --  142*   Cardiac Enzymes: No results for input(s): CKTOTAL, CKMB, CKMBINDEX, TROPONINI in the last 168 hours. BNP: BNP (last 3 results) Recent Labs    09/03/23 0539  BNP 147.5*    ProBNP (last 3 results) No results for input(s): PROBNP in the last 8760 hours.  CBG: Recent Labs  Lab 09/02/23 1656 09/02/23 2253 09/03/23 0640 09/03/23 1102  GLUCAP 240* 174* 121* 159*       Signed:  Meliton Monte MD.  [facility] 09/03/2023, 12:50 PM

## 2023-09-03 NOTE — Plan of Care (Signed)
  Problem: Education: Goal: Ability to describe self-care measures that may prevent or decrease complications (Diabetes Survival Skills Education) will improve Outcome: Adequate for Discharge Goal: Individualized Educational Video(s) Outcome: Adequate for Discharge   Problem: Coping: Goal: Ability to adjust to condition or change in health will improve Outcome: Adequate for Discharge   Problem: Fluid Volume: Goal: Ability to maintain a balanced intake and output will improve Outcome: Adequate for Discharge

## 2023-09-03 NOTE — Progress Notes (Signed)
 Explained discharge instructions to patient. Reviewed follow up appointment and next medication administration times. Also reviewed education. Patient verbalized having an understanding for instructions given. All belongings are in the patient's possession. IV and telemetry. No other needs verbalized. Volunteer services to transport downstairs for discharge.

## 2023-09-03 NOTE — TOC Transition Note (Signed)
 Transition of Care [facility]) - Discharge Note   Patient Details  Name: STEELE STRACENER MRN: [id number] Date of Birth: 03-Aug-1950  Transition of [facility]) CM/SW Contact:  Waddell Barnie Rama, RN Phone Number: 09/03/2023, 1:02 PM   Clinical Narrative:    For dc today, wife will transport home. Has no needs.         Patient Goals and CMS Choice            Discharge Placement                       Discharge Plan and Services Additional resources added to the After Visit Summary for                                       Social Drivers of Health (SDOH) Interventions SDOH Screenings   Food Insecurity: Unknown (09/03/2023)  Housing: Low Risk  (09/03/2023)  Transportation Needs: No Transportation Needs (09/03/2023)  Utilities: Not At Risk (07/23/2023)  Alcohol Screen: Low Risk  (07/23/2023)  Depression (PHQ2-9): Low Risk  (07/23/2023)  Financial Resource Strain: Low Risk  (07/23/2023)  Physical Activity: Sufficiently Active (07/23/2023)  Social Connections: Socially Integrated (09/03/2023)  Stress: No Stress Concern Present (07/23/2023)  Tobacco Use: Medium Risk (07/23/2023)  Health Literacy: Adequate Health Literacy (07/23/2023)     Readmission Risk Interventions     No data to display

## 2023-09-03 NOTE — Progress Notes (Signed)
 The patient has  his own insulin  pump. Dr. Marcene notified and he indicated,  let's hold insulin  pump for now and I'll ask day hospitalist to re-evaluate resumption of the pump.

## 2023-09-03 NOTE — Progress Notes (Signed)
 Subjective:  Denies SSCP, palpitations or Dyspnea   Objective:  Vitals:   09/03/23 0315 09/03/23 0321 09/03/23 0602 09/03/23 0729  BP: 109/66  130/73 135/83  Pulse: 60  (!) 59 60  Resp: 13   18  Temp:  97.9 F (36.6 C) 97.6 F (36.4 C) 97.7 F (36.5 C)  TempSrc:  Oral Oral Oral  SpO2: 97%  100% 100%  Weight:      Height:        Intake/Output from previous day:  Intake/Output Summary (Last 24 hours) at 09/03/2023 1020 Last data filed at 09/03/2023 0923 Gross per 24 hour  Intake 2216 ml  Output --  Net 2216 ml    Physical Exam: Small echymosis back of head Lungs clear No murmur  AICD under left clavicle  No edema  Lab Results: Basic Metabolic Panel: Recent Labs    09/02/23 1658 09/02/23 1704 09/03/23 0539  NA 138 139 140  K 4.1 4.0 3.6  CL 103 103 109  CO2 23  --  25  GLUCOSE 248* 249* 143*  BUN 21 24* 20  CREATININE 2.19* 2.30* 1.81*  CALCIUM  8.6*  --  8.4*  MG  --   --  2.3   Liver Function Tests: Recent Labs    09/02/23 1658 09/03/23 0539  AST 30 22  ALT 23 23  ALKPHOS 48 49  BILITOT 1.4* 0.9  PROT 6.2* 5.8*  ALBUMIN 3.5 3.1*   No results for input(s): LIPASE, AMYLASE in the last 72 hours. CBC: Recent Labs    09/02/23 1658 09/02/23 1704 09/03/23 0539  WBC 11.6*  --  5.7  NEUTROABS  --   --  2.7  HGB 15.6 16.3 15.1  HCT 46.3 48.0 45.0  MCV 97.3  --  98.5  PLT 160  --  142*     Imaging: CT HEAD WO CONTRAST Result Date: 09/02/2023 CLINICAL DATA:  Head trauma, neck trauma, fall. Chest pain this afternoon. Syncopal episode. Fell and hit back of head. EXAM: CT HEAD WITHOUT CONTRAST CT CERVICAL SPINE WITHOUT CONTRAST TECHNIQUE: Multidetector CT imaging of the head and cervical spine was performed following the standard protocol without intravenous contrast. Multiplanar CT image reconstructions of the cervical spine were also generated. RADIATION DOSE REDUCTION: This exam was performed according to the departmental dose-optimization  program which includes automated exposure control, adjustment of the mA and/or kV according to patient size and/or use of iterative reconstruction technique. COMPARISON:  CT head 01/24/2015. FINDINGS: CT HEAD FINDINGS Brain: No acute intracranial hemorrhage. No CT evidence of acute infarct. Nonspecific hypoattenuation in the periventricular and subcortical white matter favored to reflect chronic microvascular ischemic changes. Redemonstrated remote infarct in the left parietal periventricular white matter. Additional small remote infarct in the lateral left cerebellum. No edema, mass effect, or midline shift. The basilar cisterns are patent. Ventricles: The ventricles are normal. Vascular: No hyperdense vessel or unexpected calcification. Skull: No acute or aggressive finding. Orbits: Orbits are symmetric. Sinuses: Mucous retention cyst in the right sphenoid sinus. Other: Mastoid air cells are clear. CT CERVICAL SPINE FINDINGS Alignment: Straightening of the normal cervical lordosis. There is 3 mm anterolisthesis of C3 on C4 likely related to degenerative changes. No facet subluxation or dislocation. Skull base and vertebrae: No acute fracture. No primary bone lesion or focal pathologic process. Soft tissues and spinal canal: No prevertebral fluid or swelling. No visible canal hematoma. Disc levels: Intervertebral disc space narrowing most pronounced at C4-5 through C6-7. Disc osteophyte complexes at  multiple levels. No high-grade osseous spinal canal stenosis. Facet arthrosis and uncovertebral hypertrophy throughout the cervical spine. Foraminal stenosis at multiple levels. Upper chest: Negative. Other: None. IMPRESSION: No CT evidence of acute intracranial abnormality. No acute fracture or traumatic malalignment of the cervical spine. Small remote infarcts in the left periventricular white matter and left cerebellum. Degenerative changes as above. Electronically Signed   By: Donnice Mania M.D.   On: 09/02/2023  17:40   CT CERVICAL SPINE WO CONTRAST Result Date: 09/02/2023 CLINICAL DATA:  Head trauma, neck trauma, fall. Chest pain this afternoon. Syncopal episode. Fell and hit back of head. EXAM: CT HEAD WITHOUT CONTRAST CT CERVICAL SPINE WITHOUT CONTRAST TECHNIQUE: Multidetector CT imaging of the head and cervical spine was performed following the standard protocol without intravenous contrast. Multiplanar CT image reconstructions of the cervical spine were also generated. RADIATION DOSE REDUCTION: This exam was performed according to the departmental dose-optimization program which includes automated exposure control, adjustment of the mA and/or kV according to patient size and/or use of iterative reconstruction technique. COMPARISON:  CT head 01/24/2015. FINDINGS: CT HEAD FINDINGS Brain: No acute intracranial hemorrhage. No CT evidence of acute infarct. Nonspecific hypoattenuation in the periventricular and subcortical white matter favored to reflect chronic microvascular ischemic changes. Redemonstrated remote infarct in the left parietal periventricular white matter. Additional small remote infarct in the lateral left cerebellum. No edema, mass effect, or midline shift. The basilar cisterns are patent. Ventricles: The ventricles are normal. Vascular: No hyperdense vessel or unexpected calcification. Skull: No acute or aggressive finding. Orbits: Orbits are symmetric. Sinuses: Mucous retention cyst in the right sphenoid sinus. Other: Mastoid air cells are clear. CT CERVICAL SPINE FINDINGS Alignment: Straightening of the normal cervical lordosis. There is 3 mm anterolisthesis of C3 on C4 likely related to degenerative changes. No facet subluxation or dislocation. Skull base and vertebrae: No acute fracture. No primary bone lesion or focal pathologic process. Soft tissues and spinal canal: No prevertebral fluid or swelling. No visible canal hematoma. Disc levels: Intervertebral disc space narrowing most pronounced at  C4-5 through C6-7. Disc osteophyte complexes at multiple levels. No high-grade osseous spinal canal stenosis. Facet arthrosis and uncovertebral hypertrophy throughout the cervical spine. Foraminal stenosis at multiple levels. Upper chest: Negative. Other: None. IMPRESSION: No CT evidence of acute intracranial abnormality. No acute fracture or traumatic malalignment of the cervical spine. Small remote infarcts in the left periventricular white matter and left cerebellum. Degenerative changes as above. Electronically Signed   By: Donnice Mania M.D.   On: 09/02/2023 17:40   CT CHEST ABDOMEN PELVIS W CONTRAST Result Date: 09/02/2023 CLINICAL DATA:  Trauma. EXAM: CT CHEST, ABDOMEN, AND PELVIS WITH CONTRAST TECHNIQUE: Multidetector CT imaging of the chest, abdomen and pelvis was performed following the standard protocol during bolus administration of intravenous contrast. RADIATION DOSE REDUCTION: This exam was performed according to the departmental dose-optimization program which includes automated exposure control, adjustment of the mA and/or kV according to patient size and/or use of iterative reconstruction technique. CONTRAST:  OMNIPAQUE  IOHEXOL  350 MG/ML SOLN COMPARISON:  CT pelvis dated 07/16/2022. FINDINGS: CT CHEST FINDINGS Cardiovascular: There is no cardiomegaly. Small pericardial effusion measuring 4 mm in thickness anterior to the heart. Three vessel coronary vascular calcification. Left pectoral pacemaker device. The thoracic aorta is unremarkable. The origins of the great vessels of the aortic arch and the central pulmonary arteries are patent. Mediastinum/Nodes: No hilar or mediastinal adenopathy. The esophagus is grossly unremarkable. No mediastinal fluid collection. Lungs/Pleura: No focal consolidation, pleural  effusion, or pneumothorax. The central airways are patent. Musculoskeletal: No acute osseous pathology. CT ABDOMEN PELVIS FINDINGS No intra-abdominal free air or free fluid.  Hepatobiliary: Slight irregularity of the liver contour. Clinical correlation recommended to evaluate for possibility of early cirrhosis. No biliary dilatation. The gallbladder is unremarkable. Pancreas: Subcentimeter hypodense focus in the head of the pancreas is suboptimally evaluated but may represent a side branch IPMN. No active inflammation. No dilatation of the main pancreatic duct or gland atrophy. Spleen: Normal in size without focal abnormality. Adrenals/Urinary Tract: The adrenal glands are unremarkable. There is a 2.5 x 1.7 cm complex cyst with solid nodular component versus a solid lesion arising from the lateral interpolar right kidney suspicious for a renal neoplasm. Further evaluation with renal mass protocol MRI is recommended. A 3.5 cm right renal inferior pole cyst. There is no hydronephrosis on either side. There is symmetric enhancement and excretion of contrast by both kidneys. The visualized ureters and urinary bladder appear unremarkable. Stomach/Bowel: There is no bowel obstruction or active inflammation. The appendix is normal. Vascular/Lymphatic: Moderate aortoiliac atherosclerotic disease. The IVC is unremarkable. No portal venous gas. There is no adenopathy. Reproductive: The prostate gland is enlarged measuring 5.6 cm in transverse axial diameter. Other: None Musculoskeletal: No acute or significant osseous findings. IMPRESSION: 1. No acute/traumatic intrathoracic, abdominal, or pelvic pathology. 2. A 2.5 cm lesion in the lateral interpolar right kidney suspicious for a renal neoplasm. Further evaluation with renal mass protocol MRI is recommended. 3. Subcentimeter hypodense focus in the head of the pancreas may represent a side branch IPMN. Attention on follow-up imaging. 4. Enlarged prostate gland. 5.  Aortic Atherosclerosis (ICD10-I70.0). Electronically Signed   By: Vanetta Chou M.D.   On: 09/02/2023 17:36   DG Pelvis Portable Result Date: 09/02/2023 CLINICAL DATA:  Level 1  trauma EXAM: PORTABLE PELVIS 1-2 VIEWS COMPARISON:  None Available. FINDINGS: There is no evidence of pelvic fracture or diastasis. No pelvic bone lesions are seen. There surgical clips in the pelvis. Is IMPRESSION: Negative. Electronically Signed   By: Greig Pique M.D.   On: 09/02/2023 17:07   DG Chest Port 1 View Result Date: 09/02/2023 CLINICAL DATA:  Trauma EXAM: PORTABLE CHEST 1 VIEW COMPARISON:  Chest x-ray 07/01/2022 FINDINGS: Left-sided ICD is present. The heart is mildly enlarged. The lungs and costophrenic angles are clear. There is no pneumothorax. No acute fractures are seen. IMPRESSION: 1. No active disease. 2. Mild cardiomegaly. Electronically Signed   By: Greig Pique M.D.   On: 09/02/2023 17:06    Cardiac Studies:  ECG: A sense V pacing RBBB   Telemetry:  A sense V pacing   Echo: pending  Medications:    insulin  aspart  0-5 Units Subcutaneous QHS   insulin  aspart  0-9 Units Subcutaneous TID WC      Assessment/Plan:   Syncope:  ? Vasovagal in setting of dehydration. BS not low. No angina TTE pending [employer] device interrogation with no Rx deliveries and normal function  DCM:  known ischemic DCM with EF 30-35% updated TTE pending no angina ECG unrevealing with pacing Troponins negative Renal Mass:  and pancrease would have renal see him in house and arrange MRI abdomen expeditiously  OK to d/c home from cardiology stand point  Maude Emmer 09/03/2023, 10:20 AM

## 2023-09-03 NOTE — ED Notes (Addendum)
 Trauma Response Nurse Documentation   John Carrillo is a 73 y.o. male arriving to [place] ED via EMS  On clopidogrel  75 mg daily. Trauma was activated as a Level 1 by ED Charge RN/TRN based on the following trauma criteria Anytime Systolic Blood Pressure < 90. SBP 86. Patient cleared for CT by Dr. Paola. Pt transported to CT with trauma response nurse present to monitor. RN remained with the patient throughout their absence from the department for clinical observation.   GCS 15.  Trauma MD Arrival Time: Dr Paola at [phone].  History   Past Medical History:  Diagnosis Date   Acute kidney injury superimposed on chronic kidney disease (HCC) 01/25/2015   AICD (automatic cardioverter/defibrillator) present    CHF (congestive heart failure) (HCC)    CORONARY ARTERY DISEASE 08/11/2006   CVA 12/19/2006   2008   DIABETES MELLITUS, TYPE I 08/11/2006   ECZEMA, HANDS 12/15/2009   History of kidney stones    10+ years ago   HYPERLIPIDEMIA 08/11/2006   HYPERTENSION 08/11/2006   Hypothyroidism    Myocardial infarction [facility])    Presence of permanent cardiac pacemaker    Type 2 diabetes mellitus without complications (HCC) 12/20/2016     Past Surgical History:  Procedure Laterality Date   CARDIAC CATHETERIZATION N/A 01/16/2015   Procedure: Left Heart Cath and Coronary Angiography;  Surgeon: Dorn JINNY Lesches, MD;  Location: [facility] INVASIVE CV LAB;  Service: Cardiovascular;  Laterality: N/A;   CORONARY STENT PLACEMENT     EP IMPLANTABLE DEVICE N/A 01/10/2016   MDT Claria MRI Quad CRTD implanted by Dr Kelsie   INGUINAL HERNIA REPAIR Left 05/22/2020   Procedure: LEFT INGUINAL HERNIA REPAIR WITH MESH;  Surgeon: Vernetta Berg, MD;  Location: [facility] OR;  Service: General;  Laterality: Left;   INSERT / REPLACE / REMOVE PACEMAKER     INSERTION OF MESH Left 05/22/2020   Procedure: INSERTION OF MESH;  Surgeon: Vernetta Berg, MD;  Location: [facility] OR;  Service: General;  Laterality: Left;   IR ANGIOGRAM PELVIS  SELECTIVE OR SUPRASELECTIVE  08/28/2022   IR ANGIOGRAM SELECTIVE EACH ADDITIONAL VESSEL  08/28/2022   IR ANGIOGRAM SELECTIVE EACH ADDITIONAL VESSEL  08/28/2022   IR ANGIOGRAM SELECTIVE EACH ADDITIONAL VESSEL  08/28/2022   IR EMBO TUMOR ORGAN ISCHEMIA INFARCT INC GUIDE ROADMAPPING  08/28/2022   IR US  GUIDE VASC ACCESS LEFT  08/28/2022   IR US  GUIDE VASC ACCESS LEFT  08/28/2022   IR US  GUIDE VASC ACCESS LEFT  08/28/2022   IR US  GUIDE VASC ACCESS RIGHT  08/28/2022   TEE WITHOUT CARDIOVERSION N/A 01/26/2015   Procedure: TRANSESOPHAGEAL ECHOCARDIOGRAM (TEE);  Surgeon: Jerel Balding, MD;  Location: [facility] ENDOSCOPY;  Service: Cardiovascular;  Laterality: N/A;   TONSILECTOMY, ADENOIDECTOMY, BILATERAL MYRINGOTOMY AND TUBES     TONSILLECTOMY     WISDOM TOOTH EXTRACTION     WRIST FRACTURE SURGERY Right 1960   3rd grade     Initial Focused Assessment (If applicable, or please see trauma documentation): Airway: Patent, intact Breathing: Breath sounds clear, equal bilaterally. No CP or SOB upon arrival. Pt does report a burning sensation in his upper chest similar to how he felt when he previously had pericarditis.  Circulation: Small hematoma noted to back top of pt's head, no external bleeding. Pulses intact centrally and peripherally.  AICD noted to pt's left chest. 20G PIV to L AC placed by EMS. 500cc of fluids given en route. SBP improved upon arrival. Insulin  pump to RLQ of abdomen.  Disability: PERRLA, 2's brisk. A/Ox4. MAE with equal sensation throughout. No c-collar in place but c/o no neck pain and pt prefers to not wear one.   CT's Completed:   CT Head, CT C-Spine, CT Chest w/ contrast, and CT abdomen/pelvis w/ contrast   Interventions:  Trauma labs drawn CXR Pelvic XR Undressed pt and assessed  Palpated back - no stepoffs or deformities noted SBP obtained: 98/62 Warm blankets applied  CT pan scan  Plan for disposition:  Admission to floor for syncope workup - admit to  medicine/cards  Consults completed:  none at 1730  Event Summary: Pt was bib [facility] after he had a fall and struck his head.  Patient is on Plavix .  Pt states that he was in the bathroom at the sink washing his hands when he all of a sudden felt a burning sensation across his chest and immediately felt flush and diaphoretic.  Pt reports that he does not remember what happened from there and woke up on the floor. Pt called out for his wife who then called 911.  Pt noticed a knot on the back of his head and states that he thinks he hit his head as well as his tailbone/hip.  Pt feels much better upon arrival to hospital and responded well to fluids given by EMS.  Pt was seen by cardiology in the ED and pt is being admitted to medicine for syncope workup and having his AICD interrogated. Pt's wife has been notified of updates and plans to come back to the hospital once patient is in his room for the night.   Bedside handoff with ED RN John Carrillo and John [name]    John Carrillo  Trauma Response RN  Please call TRN at [phone] for further assistance.

## 2023-09-03 NOTE — H&P (Signed)
 History and Physical    John Carrillo FMW:[id number] DOB: 1950/05/10 DOA: 09/02/2023  PCP: Katrinka Garnette KIDD, MD  Patient coming from: home  I have personally briefly reviewed patient's old medical records in [facility]  Chief Complaint: syncope  HPI: John Carrillo is John Carrillo 73 y.o. male with medical history significant of CAD, HFrEF, CRT-AICD, HTN, hypothyroidism, T1DM and other medical issues here after an episode of syncope.  He notes his symptoms started yesterday around 1:30-2.  He was reading/working on his computer (sitting down) and then developed chest pain.  Describes it as feeling like when he had pericarditis.  Burning, radiating into back, neck, and down part of arms.  Came and went.  Like fireants stinging his skin.  He tried to ride it out.  Better when he leaned forward.  Tried ibuprofen, that maybe helped, but he's not sure.  He walked around John Carrillo little.  Notes lightheadedness during this period of time.  Had to urgently use bathroom (urinated and had BM), then he got up to get cleaned up bc he was sweating after using the bathroom.  He reached to grab Ranger Petrich towel and then he was out.  Chest pain stopped after the episode.  Only new meds/supplements are creatinine and leucine started 8-9 months ago.  He did Nezar Buckles new exercise routine yesterday morning, kettlebells lifted over his head.  He helped family move this weekend, but stayed hydrated.  No smoking, occasional drinking.  ED Course: Labs, imaging.  Level 2 trauma.  Cards, surgery eval.  Admit to hospitalist.  Carryover.  Review of Systems: As per HPI otherwise all other systems reviewed and are negative.  Past Medical History:  Diagnosis Date   Acute kidney injury superimposed on chronic kidney disease (HCC) 01/25/2015   AICD (automatic cardioverter/defibrillator) present    CHF (congestive heart failure) (HCC)    CORONARY ARTERY DISEASE 08/11/2006   CVA 12/19/2006   2008   DIABETES MELLITUS, TYPE I 08/11/2006   ECZEMA,  HANDS 12/15/2009   History of kidney stones    10+ years ago   HYPERLIPIDEMIA 08/11/2006   HYPERTENSION 08/11/2006   Hypothyroidism    Myocardial infarction [facility])    Presence of permanent cardiac pacemaker    Type 2 diabetes mellitus without complications (HCC) 12/20/2016    Past Surgical History:  Procedure Laterality Date   CARDIAC CATHETERIZATION N/Avarose Mervine 01/16/2015   Procedure: Left Heart Cath and Coronary Angiography;  Surgeon: Dorn JINNY Lesches, MD;  Location: [facility] INVASIVE CV LAB;  Service: Cardiovascular;  Laterality: N/Jhayla Podgorski;   CORONARY STENT PLACEMENT     EP IMPLANTABLE DEVICE N/Maimouna Rondeau 01/10/2016   MDT Claria MRI Quad CRTD implanted by Dr Kelsie   INGUINAL HERNIA REPAIR Left 05/22/2020   Procedure: LEFT INGUINAL HERNIA REPAIR WITH MESH;  Surgeon: Vernetta Berg, MD;  Location: [facility] OR;  Service: General;  Laterality: Left;   INSERT / REPLACE / REMOVE PACEMAKER     INSERTION OF MESH Left 05/22/2020   Procedure: INSERTION OF MESH;  Surgeon: Vernetta Berg, MD;  Location: [facility] OR;  Service: General;  Laterality: Left;   IR ANGIOGRAM PELVIS SELECTIVE OR SUPRASELECTIVE  08/28/2022   IR ANGIOGRAM SELECTIVE EACH ADDITIONAL VESSEL  08/28/2022   IR ANGIOGRAM SELECTIVE EACH ADDITIONAL VESSEL  08/28/2022   IR ANGIOGRAM SELECTIVE EACH ADDITIONAL VESSEL  08/28/2022   IR EMBO TUMOR ORGAN ISCHEMIA INFARCT INC GUIDE ROADMAPPING  08/28/2022   IR US  GUIDE VASC ACCESS LEFT  08/28/2022   IR US  GUIDE VASC ACCESS  LEFT  08/28/2022   IR US  GUIDE VASC ACCESS LEFT  08/28/2022   IR US  GUIDE VASC ACCESS RIGHT  08/28/2022   TEE WITHOUT CARDIOVERSION N/Tracker Mance 01/26/2015   Procedure: TRANSESOPHAGEAL ECHOCARDIOGRAM (TEE);  Surgeon: Jerel Balding, MD;  Location: [facility] ENDOSCOPY;  Service: Cardiovascular;  Laterality: N/Earlene Bjelland;   TONSILECTOMY, ADENOIDECTOMY, BILATERAL MYRINGOTOMY AND TUBES     TONSILLECTOMY     WISDOM TOOTH EXTRACTION     WRIST FRACTURE SURGERY Right 1960   3rd grade    Social History  reports that he quit smoking about 23  years ago. His smoking use included cigarettes. He started smoking about 53 years ago. He has John Carrillo 30 pack-year smoking history. He has never used smokeless tobacco. He reports current alcohol use of about 3.0 standard drinks of alcohol per week. He reports that he does not use drugs.  No Known Allergies  Family History  Problem Relation Age of Onset   CAD Father    Other Mother        passed right before 98- tumor on kidney recent diagnosis   Dementia Mother        progressing rapidly   CAD Paternal Grandfather        58s   CAD Paternal Grandmother        73s   Brain cancer Maternal Grandmother    Lymphoma Sister        b cell   Celiac disease Sister    Diabetes Neg Hx     Prior to Admission medications   Medication Sig Start Date End Date Taking? Authorizing Provider  Blood Glucose Monitoring Suppl (ONETOUCH VERIO) w/Device KIT 1 Device by Does not apply route daily. 10/02/20   Kassie Mallick, MD  carvedilol  (COREG ) 6.25 MG tablet TAKE 1 TABLET(6.25 MG) BY MOUTH TWICE DAILY 12/30/22   Lesia Ozell Barter, PA-C  CINNAMON PO Take 1 tablet by mouth daily.    [provider]  clopidogrel  (PLAVIX ) 75 MG tablet Take 1 tablet (75 mg total) by mouth daily. 02/10/23   Nishan, Peter C, MD  Coenzyme Q10 (COQ10) 200 MG CAPS Take 200 mg by mouth in the morning.    [provider]  Continuous Glucose Sensor (DEXCOM G7 SENSOR) MISC USE FOR CONTINUOUS BLOOD GLUCOSE MONITORING. REPLACE EVERY 10 DAYS 09/02/23   Thapa, [place], MD  dapagliflozin  propanediol (FARXIGA ) 10 MG TABS tablet Take 1 tablet (10mg ) by mouth daily before breakfast 06/16/23   Thapa, [place], MD  ezetimibe  (ZETIA ) 10 MG tablet TAKE 1 TABLET(10 MG) BY MOUTH DAILY. 08/04/23   Katrinka Garnette KIDD, MD  GARLIC PO Take 1 tablet by mouth daily.    [provider]  Glucagon  (GVOKE HYPOPEN  2-PACK) 0.5 MG/0.1ML SOAJ Use for severe lows 05/15/22   Von Pacific, MD  glucose blood (ONETOUCH VERIO) test strip 1 each by Other  route 2 (two) times daily. And lancets 2/day 10/25/16   Kassie Mallick, MD  HUMALOG  KWIKPEN 100 UNIT/ML KwikPen INJECT UNDER THE SKIN 7 UNITS EVERY MORNING, 3 UNITS AT LUNCH, AND 7 UNITS AT DINNER. 06/30/22   Von [place], MD  Insulin  Infusion Pump Supplies (TANDEM MOBI AUTOSOFT XC KIT) MISC  04/10/23   [provider]  insulin  lispro (HUMALOG ) 100 UNIT/ML injection Use 60 units per day via pump. 03/11/23   Thapa, [place], MD  Insulin  Lispro Prot & Lispro (HUMALOG  MIX 50/50 KWIKPEN) (50-50) 100 UNIT/ML Kwikpen Inject under the skin 7 units in every morning , 3 units at lunch and 7 units  at dinner 09/11/22   Von Pacific, MD  Insulin  Pen Needle (PEN NEEDLES) 32G X 4 MM MISC Use to inject insulin  4 times Zenon Leaf day 02/21/22   Von Pacific, MD  levothyroxine  (SYNTHROID ) 125 MCG tablet Take 1 tablet (125 mcg total) by mouth daily. 04/28/23   Thapa, [place], MD  Probiotic Product (PROBIOTIC PO) Take 1 capsule by mouth every morning.    [provider]  rosuvastatin  (CRESTOR ) 10 MG tablet Take 1 tablet (10 mg total) by mouth daily. 11/11/22   Delford Maude BROCKS, MD  sacubitril -valsartan  (ENTRESTO ) 49-51 MG Take 1 tablet by mouth 2 (two) times daily. 10/11/22   Delford Maude BROCKS, MD  tadalafil  (CIALIS ) 5 MG tablet TAKE 1 TABLET(5 MG) BY MOUTH DAILY 05/13/23   Katrinka Garnette KIDD, MD  tamsulosin (FLOMAX) 0.4 MG CAPS capsule Take 0.4 mg by mouth daily. 04/25/22   [provider]  TOUJEO  SOLOSTAR 300 UNIT/ML Solostar Pen ADMINISTER 38 UNITS UNDER THE SKIN EVERY DAY 08/13/22   Von Pacific, MD    Physical Exam: Vitals:   09/03/23 0315 09/03/23 0321 09/03/23 0602 09/03/23 0729  BP: 109/66  130/73 135/83  Pulse: 60  (!) 59 60  Resp: 13   18  Temp:  97.9 F (36.6 C) 97.6 F (36.4 C) 97.7 F (36.5 C)  TempSrc:  Oral Oral Oral  SpO2: 97%  100% 100%  Weight:      Height:        Constitutional: NAD, calm, comfortable Vitals:   09/03/23 0315 09/03/23 0321 09/03/23 0602 09/03/23 0729  BP: 109/66  130/73 135/83   Pulse: 60  (!) 59 60  Resp: 13   18  Temp:  97.9 F (36.6 C) 97.6 F (36.4 C) 97.7 F (36.5 C)  TempSrc:  Oral Oral Oral  SpO2: 97%  100% 100%  Weight:      Height:       Eyes: PERRL, lids and conjunctivae normal ENMT: Mucous membranes are moist.  Neck: normal, supple Respiratory: clear to auscultation bilaterally, no wheezing, no crackles.  Cardiovascular: Regular rate and rhythm, no murmurs / rubs / gallops. No extremity edema.   No lesions to chest/no rash. Abdomen: no tenderness, no masses palpated. No hepatosplenomegaly. Bowel sounds positive.  Musculoskeletal: no clubbing / cyanosis. No joint deformity upper and lower extremities. Good ROM, no contractures. Normal muscle tone.  Skin: no rashes, lesions, ulcers. No induration Neurologic: CN 2-12 grossly intact. Moving all extremities. Psychiatric: Normal judgment and insight. Alert and oriented x 3. Normal mood.   Labs on Admission: I have personally reviewed following labs and imaging studies  CBC: Recent Labs  Lab 09/02/23 1658 09/02/23 1704 09/03/23 0539  WBC 11.6*  --  5.7  NEUTROABS  --   --  2.7  HGB 15.6 16.3 15.1  HCT 46.3 48.0 45.0  MCV 97.3  --  98.5  PLT 160  --  142*    Basic Metabolic Panel: Recent Labs  Lab 09/02/23 1658 09/02/23 1704 09/03/23 0539  NA 138 139 140  K 4.1 4.0 3.6  CL 103 103 109  CO2 23  --  25  GLUCOSE 248* 249* 143*  BUN 21 24* 20  CREATININE 2.19* 2.30* 1.81*  CALCIUM  8.6*  --  8.4*  MG  --   --  2.3    GFR: Estimated Creatinine Clearance: 36.9 mL/min (Devera Englander) (by C-G formula based on SCr of 1.81 mg/dL (H)).  Liver Function Tests: Recent Labs  Lab 09/02/23 1658 09/03/23 0539  AST 30 22  ALT 23 23  ALKPHOS 48 49  BILITOT 1.4* 0.9  PROT 6.2* 5.8*  ALBUMIN 3.5 3.1*    Urine analysis:    Component Value Date/Time   COLORURINE YELLOW 02/13/2018 0911   APPEARANCEUR CLEAR 02/13/2018 0911   LABSPEC 1.020 02/13/2018 0911   PHURINE 6.0 02/13/2018 0911   GLUCOSEU  >=1000 (Odean Mcelwain) 02/13/2018 0911   HGBUR NEGATIVE 02/13/2018 0911   BILIRUBINUR NEGATIVE 02/13/2018 0911   BILIRUBINUR Negative 01/03/2017 0918   KETONESUR NEGATIVE 02/13/2018 0911   PROTEINUR Negative 01/03/2017 0918   PROTEINUR NEGATIVE 01/25/2015 0235   UROBILINOGEN 0.2 02/13/2018 0911   NITRITE NEGATIVE 02/13/2018 0911   LEUKOCYTESUR NEGATIVE 02/13/2018 0911    Radiological Exams on Admission: CT HEAD WO CONTRAST Result Date: 09/02/2023 CLINICAL DATA:  Head trauma, neck trauma, fall. Chest pain this afternoon. Syncopal episode. Fell and hit back of head. EXAM: CT HEAD WITHOUT CONTRAST CT CERVICAL SPINE WITHOUT CONTRAST TECHNIQUE: Multidetector CT imaging of the head and cervical spine was performed following the standard protocol without intravenous contrast. Multiplanar CT image reconstructions of the cervical spine were also generated. RADIATION DOSE REDUCTION: This exam was performed according to the departmental dose-optimization program which includes automated exposure control, adjustment of the mA and/or kV according to patient size and/or use of iterative reconstruction technique. COMPARISON:  CT head 01/24/2015. FINDINGS: CT HEAD FINDINGS Brain: No acute intracranial hemorrhage. No CT evidence of acute infarct. Nonspecific hypoattenuation in the periventricular and subcortical white matter favored to reflect chronic microvascular ischemic changes. Redemonstrated remote infarct in the left parietal periventricular white matter. Additional small remote infarct in the lateral left cerebellum. No edema, mass effect, or midline shift. The basilar cisterns are patent. Ventricles: The ventricles are normal. Vascular: No hyperdense vessel or unexpected calcification. Skull: No acute or aggressive finding. Orbits: Orbits are symmetric. Sinuses: Mucous retention cyst in the right sphenoid sinus. Other: Mastoid air cells are clear. CT CERVICAL SPINE FINDINGS Alignment: Straightening of the normal cervical  lordosis. There is 3 mm anterolisthesis of C3 on C4 likely related to degenerative changes. No facet subluxation or dislocation. Skull base and vertebrae: No acute fracture. No primary bone lesion or focal pathologic process. Soft tissues and spinal canal: No prevertebral fluid or swelling. No visible canal hematoma. Disc levels: Intervertebral disc space narrowing most pronounced at C4-5 through C6-7. Disc osteophyte complexes at multiple levels. No high-grade osseous spinal canal stenosis. Facet arthrosis and uncovertebral hypertrophy throughout the cervical spine. Foraminal stenosis at multiple levels. Upper chest: Negative. Other: None. IMPRESSION: No CT evidence of acute intracranial abnormality. No acute fracture or traumatic malalignment of the cervical spine. Small remote infarcts in the left periventricular white matter and left cerebellum. Degenerative changes as above. Electronically Signed   By: Donnice Mania M.D.   On: 09/02/2023 17:40   CT CERVICAL SPINE WO CONTRAST Result Date: 09/02/2023 CLINICAL DATA:  Head trauma, neck trauma, fall. Chest pain this afternoon. Syncopal episode. Fell and hit back of head. EXAM: CT HEAD WITHOUT CONTRAST CT CERVICAL SPINE WITHOUT CONTRAST TECHNIQUE: Multidetector CT imaging of the head and cervical spine was performed following the standard protocol without intravenous contrast. Multiplanar CT image reconstructions of the cervical spine were also generated. RADIATION DOSE REDUCTION: This exam was performed according to the departmental dose-optimization program which includes automated exposure control, adjustment of the mA and/or kV according to patient size and/or use of iterative reconstruction technique. COMPARISON:  CT head 01/24/2015. FINDINGS: CT HEAD FINDINGS Brain: No acute intracranial  hemorrhage. No CT evidence of acute infarct. Nonspecific hypoattenuation in the periventricular and subcortical white matter favored to reflect chronic microvascular  ischemic changes. Redemonstrated remote infarct in the left parietal periventricular white matter. Additional small remote infarct in the lateral left cerebellum. No edema, mass effect, or midline shift. The basilar cisterns are patent. Ventricles: The ventricles are normal. Vascular: No hyperdense vessel or unexpected calcification. Skull: No acute or aggressive finding. Orbits: Orbits are symmetric. Sinuses: Mucous retention cyst in the right sphenoid sinus. Other: Mastoid air cells are clear. CT CERVICAL SPINE FINDINGS Alignment: Straightening of the normal cervical lordosis. There is 3 mm anterolisthesis of C3 on C4 likely related to degenerative changes. No facet subluxation or dislocation. Skull base and vertebrae: No acute fracture. No primary bone lesion or focal pathologic process. Soft tissues and spinal canal: No prevertebral fluid or swelling. No visible canal hematoma. Disc levels: Intervertebral disc space narrowing most pronounced at C4-5 through C6-7. Disc osteophyte complexes at multiple levels. No high-grade osseous spinal canal stenosis. Facet arthrosis and uncovertebral hypertrophy throughout the cervical spine. Foraminal stenosis at multiple levels. Upper chest: Negative. Other: None. IMPRESSION: No CT evidence of acute intracranial abnormality. No acute fracture or traumatic malalignment of the cervical spine. Small remote infarcts in the left periventricular white matter and left cerebellum. Degenerative changes as above. Electronically Signed   By: Donnice Mania M.D.   On: 09/02/2023 17:40   CT CHEST ABDOMEN PELVIS W CONTRAST Result Date: 09/02/2023 CLINICAL DATA:  Trauma. EXAM: CT CHEST, ABDOMEN, AND PELVIS WITH CONTRAST TECHNIQUE: Multidetector CT imaging of the chest, abdomen and pelvis was performed following the standard protocol during bolus administration of intravenous contrast. RADIATION DOSE REDUCTION: This exam was performed according to the departmental dose-optimization  program which includes automated exposure control, adjustment of the mA and/or kV according to patient size and/or use of iterative reconstruction technique. CONTRAST:  OMNIPAQUE  IOHEXOL  350 MG/ML SOLN COMPARISON:  CT pelvis dated 07/16/2022. FINDINGS: CT CHEST FINDINGS Cardiovascular: There is no cardiomegaly. Small pericardial effusion measuring 4 mm in thickness anterior to the heart. Three vessel coronary vascular calcification. Left pectoral pacemaker device. The thoracic aorta is unremarkable. The origins of the great vessels of the aortic arch and the central pulmonary arteries are patent. Mediastinum/Nodes: No hilar or mediastinal adenopathy. The esophagus is grossly unremarkable. No mediastinal fluid collection. Lungs/Pleura: No focal consolidation, pleural effusion, or pneumothorax. The central airways are patent. Musculoskeletal: No acute osseous pathology. CT ABDOMEN PELVIS FINDINGS No intra-abdominal free air or free fluid. Hepatobiliary: Slight irregularity of the liver contour. Clinical correlation recommended to evaluate for possibility of early cirrhosis. No biliary dilatation. The gallbladder is unremarkable. Pancreas: Subcentimeter hypodense focus in the head of the pancreas is suboptimally evaluated but may represent Doy Taaffe side branch IPMN. No active inflammation. No dilatation of the main pancreatic duct or gland atrophy. Spleen: Normal in size without focal abnormality. Adrenals/Urinary Tract: The adrenal glands are unremarkable. There is Adekunle Rohrbach 2.5 x 1.7 cm complex cyst with solid nodular component versus Ladasha Schnackenberg solid lesion arising from the lateral interpolar right kidney suspicious for Glorianna Gott renal neoplasm. Further evaluation with renal mass protocol MRI is recommended. Duey Liller 3.5 cm right renal inferior pole cyst. There is no hydronephrosis on either side. There is symmetric enhancement and excretion of contrast by both kidneys. The visualized ureters and urinary bladder appear unremarkable. Stomach/Bowel:  There is no bowel obstruction or active inflammation. The appendix is normal. Vascular/Lymphatic: Moderate aortoiliac atherosclerotic disease. The IVC is unremarkable. No portal  venous gas. There is no adenopathy. Reproductive: The prostate gland is enlarged measuring 5.6 cm in transverse axial diameter. Other: None Musculoskeletal: No acute or significant osseous findings. IMPRESSION: 1. No acute/traumatic intrathoracic, abdominal, or pelvic pathology. 2. Keonia Pasko 2.5 cm lesion in the lateral interpolar right kidney suspicious for Mykel Sponaugle renal neoplasm. Further evaluation with renal mass protocol MRI is recommended. 3. Subcentimeter hypodense focus in the head of the pancreas may represent Khiem Gargis side branch IPMN. Attention on follow-up imaging. 4. Enlarged prostate gland. 5.  Aortic Atherosclerosis (ICD10-I70.0). Electronically Signed   By: Vanetta Chou M.D.   On: 09/02/2023 17:36   DG Pelvis Portable Result Date: 09/02/2023 CLINICAL DATA:  Level 1 trauma EXAM: PORTABLE PELVIS 1-2 VIEWS COMPARISON:  None Available. FINDINGS: There is no evidence of pelvic fracture or diastasis. No pelvic bone lesions are seen. There surgical clips in the pelvis. Is IMPRESSION: Negative. Electronically Signed   By: Greig Pique M.D.   On: 09/02/2023 17:07   DG Chest Port 1 View Result Date: 09/02/2023 CLINICAL DATA:  Trauma EXAM: PORTABLE CHEST 1 VIEW COMPARISON:  Chest x-ray 07/01/2022 FINDINGS: Left-sided ICD is present. The heart is mildly enlarged. The lungs and costophrenic angles are clear. There is no pneumothorax. No acute fractures are seen. IMPRESSION: 1. No active disease. 2. Mild cardiomegaly. Electronically Signed   By: Greig Pique M.D.   On: 09/02/2023 17:06    EKG: Independently reviewed. Paced rhythm  Assessment/Plan Principal Problem:   Syncope    Assessment and Plan:  Syncope Fall Occurred after episode of CP.  Was hypotensive (SBP in 90's) at presentation.  Creatinine elevated, suggesting possible  hypovolemia/dehydration.  Diaphoresis prior to event as well as using bathroom.  Suspect vagal episode (given occurrence after using bathroom) vs orthostasis with hypotension/elevated creatinine (?dehydrated after workout)? EKG paced Troponins mildly elevated, but flat CT with contrast without acute findings Echo pending orthostatics Interrogation of pacemaker, will look for results Appreciate cardiology eval, ? Vasovagal syncope - ok for discharge - will review what blood pressure meds to resume and which to hold given  Seen by trauma surgery for his fall, cleared from trauma perspective  Chest Pain [place] it felt like prior episode of pericarditis As above, trops flat.  Now has completely resolved.  Described burning through chest/back/arms - consider neuropathic pain, consider spine imaging? (But no back/neck pain and symptoms resolved, consider outpatient) Seen by cards Echo pending  Acute Kidney Injury Creatinine improving after IVF Continue to hold entresto , farxiga  CT without hydro   HFrEF  CRT-AICD Echo 2023 with EF 35-40% Repeat echo pending Will discuss which GDMT to potentially hold or reduce dose at discharge (on coreg , entresto , farxiga )  T1DM Continue insulin  pump   CAD Dyslipidemia On plavix , zetia , crestor   Hypothyroidism Synthroid   Thrombocytopenia Noted  2.5 cmlesion in lateral interpolar R kidney concerning for renal neoplasm Needs renal protocol MRI - can be outpatient  Hypodense focus in head of pancreas concerning for IPMN Consider outpatient MRI pancreas    DVT prophylaxis: SCD  Code Status:   full  Family Communication:  none  Disposition Plan:   Patient is from:  home  Anticipated DC to:  home  Anticipated DC date:  Hopefully today  Anticipated DC barriers: echo  Consults called:  cardiology  Admission status:  obs   Severity of Illness: The appropriate patient status for this patient is OBSERVATION. Observation status is judged to  be reasonable and necessary in order to provide the required intensity of  service to ensure the patient's safety. The patient's presenting symptoms, physical exam findings, and initial radiographic and laboratory data in the context of their medical condition is felt to place them at decreased risk for further clinical deterioration. Furthermore, it is anticipated that the patient will be medically stable for discharge from the hospital within 2 midnights of admission.     Meliton Monte MD [facility]  How to contact the [facility] Attending or Consulting provider 7A - 7P or covering provider during after hours 7P -7A, for this patient?   Check the care team in [facility] and look for Makhai Fulco) attending/consulting [facility] provider listed and b) the [facility] team listed Log into [email or web address] and use [place]'s universal password to access. If you do not have the password, please contact the hospital operator. Locate the [facility] provider you are looking for under [facility] and page to [place] number that you can be directly reached. If you still have difficulty reaching the provider, please page the [facility] (Director on Call) for the Hospitalists listed on amion for assistance.  09/03/2023, 10:51 AM

## 2023-09-03 NOTE — TOC CM/SW Note (Signed)
 Transition of Care [facility]) - Inpatient Brief Assessment   Patient Details  Name: John Carrillo MRN: [id number] Date of Birth: 1951/01/15  [facility]) CM/SW Contact:    Waddell Barnie Rama, RN Phone Number: 09/03/2023, 1:01 PM   Clinical Narrative: From home with spouse, has PCP and insurance on file, states has no HH services in place at this time or DME at home.  States family member will transport them home at [employer] and family is support system, states gets medications from [facility] on [address] and [employer].  Pta self ambulatory.      Transition of Care Asessment: Insurance and Status: Insurance coverage has been reviewed Patient has primary care physician: Yes Home environment has been reviewed: home with wife Prior level of function:: indep Prior/Current Home Services: No current home services Social Drivers of Health Review: SDOH reviewed no interventions necessary Readmission risk has been reviewed: Yes Transition of care needs: no transition of care needs at this time

## 2023-09-03 NOTE — Care Management Obs Status (Signed)
 MEDICARE OBSERVATION STATUS NOTIFICATION   Patient Details  Name: GARRETTE CAINE MRN: [id number] Date of Birth: [birth year]   Medicare Observation Status Notification Given:  Yes    Waddell Barnie Rama, RN 09/03/2023, 1:00 PM

## 2023-09-04 ENCOUNTER — Telehealth: Payer: Self-pay

## 2023-09-04 NOTE — Transitions of Care (Post Inpatient/ED Visit) (Signed)
   09/04/2023  Name: John Carrillo MRN: [id number] DOB: 1950/07/06  Today's TOC FU Call Status: Today's TOC FU Call Status:: Unsuccessful Call (1st Attempt) Unsuccessful Call (1st Attempt) Date: 09/04/23  Attempted to reach the patient regarding the most recent Inpatient/ED visit.  Follow Up Plan: Additional outreach attempts will be made to reach the patient to complete the Transitions of Care (Post Inpatient/ED visit) call.   Signature Julian Lemmings, LPN [facility] Nurse Health Advisor Direct Dial  [phone]

## 2023-09-04 NOTE — Transitions of Care (Post Inpatient/ED Visit) (Signed)
 09/04/2023  Name: John Carrillo MRN: [id number] DOB: 14-Feb-1950  Today's TOC FU Call Status: Today's TOC FU Call Status:: Successful TOC FU Call Completed Unsuccessful Call (1st Attempt) Date: 09/04/23 [facility] FU Call Complete Date: 09/04/23 Patient's Name and Date of Birth confirmed.  Transition Care Management Follow-up Telephone Call Date of Discharge: 09/03/23 Discharge Facility: [facility]) Type of Discharge: Inpatient Admission Primary Inpatient Discharge Diagnosis:: [facility] How have you been since you were released from the hospital?: Better Any questions or concerns?: No  Items Reviewed: Did you receive and understand the discharge instructions provided?: Yes Medications obtained,verified, and reconciled?: Yes (Medications Reviewed) Any new allergies since your discharge?: No Dietary orders reviewed?: Yes Do you have support at home?: Yes People in Home [RPT]: spouse  Medications Reviewed Today: Medications Reviewed Today     Reviewed by Emmitt Pan, LPN (Licensed Practical Nurse) on 09/04/23 at 1150  Med List Status: <None>   Medication Order Taking? Sig Documenting Provider Last Dose Status Informant  Blood Glucose Monitoring Suppl (ONETOUCH VERIO) w/Device KIT [id number] Yes 1 Device by Does not apply route daily. Kassie Mallick, MD  Active Self  carvedilol  (COREG ) 6.25 MG tablet [id number] Yes TAKE 1 TABLET(6.25 MG) BY MOUTH TWICE DAILY Tillery, Michael Andrew, PA-C  Active   CINNAMON PO [id number] Yes Take 1 tablet by mouth daily. [provider]  Active Self  clopidogrel  (PLAVIX ) 75 MG tablet [id number] Yes Take 1 tablet (75 mg total) by mouth daily. Nishan, Peter C, MD  Active   Coenzyme Q10 (COQ10) 200 MG CAPS [id number] Yes Take 200 mg by mouth in the morning. [provider]  Active Self  Continuous Glucose Sensor (DEXCOM G7 SENSOR) MISC [id number] Yes USE FOR CONTINUOUS BLOOD GLUCOSE MONITORING. REPLACE EVERY 10 DAYS Thapa, [place], MD  Active    dapagliflozin  propanediol (FARXIGA ) 10 MG TABS tablet [id number] Yes Take 1 tablet (10mg ) by mouth daily before breakfast Thapa, [place], MD  Active   ezetimibe  (ZETIA ) 10 MG tablet [id number] Yes TAKE 1 TABLET(10 MG) BY MOUTH DAILY. Katrinka Garnette KIDD, MD  Active   GARLIC PO [id number] Yes Take 1 tablet by mouth daily. [provider]  Active Self  Glucagon  (GVOKE HYPOPEN  2-PACK) 0.5 MG/0.1ML EMMANUEL [id number] Yes Use for severe lows Von Pacific, MD  Active            Med Note CEASAR, [facility]   Wed Aug 28, 2022  8:18 AM) On hand at home  glucose blood [facility] VERIO) test strip [id number] Yes 1 each by Other route 2 (two) times daily. And lancets 2/day Kassie Mallick, MD  Active Self  HUMALOG  KWIKPEN 100 UNIT/ML KwikPen [id number] Yes INJECT UNDER THE SKIN 7 UNITS EVERY MORNING, 3 UNITS AT LUNCH, AND 7 UNITS AT DINNER. Von Pacific, MD  Active   Insulin  Infusion Pump Supplies (TANDEM MOBI KATHERYNE PUMA KIT) MISC [id number] Yes  [provider]  Active   insulin  lispro (HUMALOG ) 100 UNIT/ML injection [id number] Yes Use 60 units per day via pump. Thapa, [place], MD  Active   Insulin  Lispro Prot & Lispro (HUMALOG  MIX 50/50 KWIKPEN) (50-50) 100 UNIT/ML Kary [id number] Yes Inject under the skin 7 units in every morning , 3 units at lunch and 7 units at dinner Von Pacific, MD  Active   Insulin  Pen Needle (PEN NEEDLES) 32G X 4 MM MISC [id number] Yes Use to inject insulin  4 times a day Von Pacific, MD  Active   levothyroxine  (SYNTHROID ) 125 MCG tablet [id number] Yes Take 1  tablet (125 mcg total) by mouth daily. Thapa, [place], MD  Active   Probiotic Product (PROBIOTIC PO) [id number] Yes Take 1 capsule by mouth every morning. [provider]  Active Self  rosuvastatin  (CRESTOR ) 10 MG tablet [id number] Yes Take 1 tablet (10 mg total) by mouth daily. Nishan, Peter C, MD  Active   sacubitril -valsartan  (ENTRESTO ) 49-51 MG [id number]  Take 1 tablet by mouth 2 (two) times daily.  Patient not taking: Reported  on 09/04/2023   Delford Maude BROCKS, MD  Active   tadalafil  (CIALIS ) 5 MG tablet [id number] Yes TAKE 1 TABLET(5 MG) BY MOUTH DAILY Katrinka Garnette KIDD, MD  Active   TOUJEO  SOLOSTAR 300 UNIT/ML Solostar Pen [id number] Yes ADMINISTER 38 UNITS UNDER THE SKIN EVERY DAY Von Pacific, MD  Active             Home Care and Equipment/Supplies: Were Home Health Services Ordered?: NA Any new equipment or medical supplies ordered?: NA  Functional Questionnaire: Do you need assistance with bathing/showering or dressing?: No Do you need assistance with meal preparation?: No Do you need assistance with eating?: No Do you have difficulty maintaining continence: No Do you need assistance with getting out of bed/getting out of a chair/moving?: No Do you have difficulty managing or taking your medications?: No  Follow up appointments reviewed: PCP Follow-up appointment confirmed?: No (no avail appts, sent message to staff to schedule) MD Provider Line Number:[phone] Given: No Specialist Hospital Follow-up appointment confirmed?: No Reason Specialist Follow-Up Not Confirmed: Patient has Specialist Provider Number and will Call for Appointment Do you need transportation to your follow-up appointment?: No Do you understand care options if your condition(s) worsen?: Yes-patient verbalized understanding    SIGNATURE Julian Lemmings, LPN [facility] Nurse Health Advisor Direct Dial  [phone]

## 2023-09-05 ENCOUNTER — Encounter: Payer: Self-pay | Admitting: Endocrinology

## 2023-09-05 ENCOUNTER — Other Ambulatory Visit: Payer: Self-pay | Admitting: Urology

## 2023-09-05 DIAGNOSIS — D4101 Neoplasm of uncertain behavior of right kidney: Secondary | ICD-10-CM

## 2023-09-05 NOTE — Telephone Encounter (Signed)
 If the MRI is planned for kidney lesions that will also show pancreatic lesion which means one MRI is enough for pancreatic and kidney lesion.  When the MRI images are reviewed by radiology they usually look for both kidney and pancreas.  [place] Mija Effertz, MD [facility] Endocrinology [facility] [address] [place], [address] [place], [place] [address] Phone # [phone]

## 2023-09-06 NOTE — Telephone Encounter (Signed)
 Available 8 20 this Friday morning- please schedule as soon as possible Monday morning

## 2023-09-08 ENCOUNTER — Telehealth: Payer: Self-pay | Admitting: Family Medicine

## 2023-09-08 NOTE — Telephone Encounter (Signed)
 Looking at the schedule, how about 8/11 or 8/12 end of morning? If not, I can find something else. Thank you!

## 2023-09-08 NOTE — Telephone Encounter (Signed)
 Lets try 1140 on the 11th-please let him know as we are working him in may be a substantial wait

## 2023-09-08 NOTE — Telephone Encounter (Signed)
 Provider requested pt to come in on 09/12/23 for HFU. Date was taken over the weekend. Pt needs to be seen by 09/17/23. If provider willing to [facility] or ok to see another provider? Please advise

## 2023-09-08 NOTE — Telephone Encounter (Signed)
 See below

## 2023-09-08 NOTE — Telephone Encounter (Signed)
 I'm open to overbook- do you have suggestion for end of day or end of morning for me to look at?

## 2023-09-09 NOTE — Telephone Encounter (Signed)
 See below

## 2023-09-15 ENCOUNTER — Ambulatory Visit: Payer: Self-pay | Admitting: Family Medicine

## 2023-09-15 ENCOUNTER — Ambulatory Visit (INDEPENDENT_AMBULATORY_CARE_PROVIDER_SITE_OTHER): Admitting: Family Medicine

## 2023-09-15 ENCOUNTER — Encounter: Payer: Self-pay | Admitting: Family Medicine

## 2023-09-15 VITALS — BP 110/68 | HR 60 | Temp 98.4°F | Ht 69.0 in | Wt 173.4 lb

## 2023-09-15 DIAGNOSIS — Z9861 Coronary angioplasty status: Secondary | ICD-10-CM

## 2023-09-15 DIAGNOSIS — E1051 Type 1 diabetes mellitus with diabetic peripheral angiopathy without gangrene: Secondary | ICD-10-CM | POA: Diagnosis not present

## 2023-09-15 DIAGNOSIS — E039 Hypothyroidism, unspecified: Secondary | ICD-10-CM | POA: Diagnosis not present

## 2023-09-15 DIAGNOSIS — E785 Hyperlipidemia, unspecified: Secondary | ICD-10-CM | POA: Diagnosis not present

## 2023-09-15 DIAGNOSIS — N179 Acute kidney failure, unspecified: Secondary | ICD-10-CM | POA: Diagnosis not present

## 2023-09-15 DIAGNOSIS — I10 Essential (primary) hypertension: Secondary | ICD-10-CM

## 2023-09-15 DIAGNOSIS — I2510 Atherosclerotic heart disease of native coronary artery without angina pectoris: Secondary | ICD-10-CM

## 2023-09-15 DIAGNOSIS — I5022 Chronic systolic (congestive) heart failure: Secondary | ICD-10-CM

## 2023-09-15 DIAGNOSIS — Z95811 Presence of heart assist device: Secondary | ICD-10-CM

## 2023-09-15 DIAGNOSIS — R55 Syncope and collapse: Secondary | ICD-10-CM | POA: Diagnosis not present

## 2023-09-15 LAB — CBC WITH DIFFERENTIAL/PLATELET
Basophils Absolute: 0 K/uL (ref 0.0–0.1)
Basophils Relative: 0.6 % (ref 0.0–3.0)
Eosinophils Absolute: 0.3 K/uL (ref 0.0–0.7)
Eosinophils Relative: 4.6 % (ref 0.0–5.0)
HCT: 45.9 % (ref 39.0–52.0)
Hemoglobin: 15.4 g/dL (ref 13.0–17.0)
Lymphocytes Relative: 25.3 % (ref 12.0–46.0)
Lymphs Abs: 1.7 K/uL (ref 0.7–4.0)
MCHC: 33.6 g/dL (ref 30.0–36.0)
MCV: 97.8 fl (ref 78.0–100.0)
Monocytes Absolute: 1 K/uL (ref 0.1–1.0)
Monocytes Relative: 14.8 % — ABNORMAL HIGH (ref 3.0–12.0)
Neutro Abs: 3.6 K/uL (ref 1.4–7.7)
Neutrophils Relative %: 54.7 % (ref 43.0–77.0)
Platelets: 170 K/uL (ref 150.0–400.0)
RBC: 4.69 Mil/uL (ref 4.22–5.81)
RDW: 13.1 % (ref 11.5–15.5)
WBC: 6.7 K/uL (ref 4.0–10.5)

## 2023-09-15 LAB — COMPREHENSIVE METABOLIC PANEL WITH GFR
ALT: 22 U/L (ref 0–53)
AST: 22 U/L (ref 0–37)
Albumin: 3.8 g/dL (ref 3.5–5.2)
Alkaline Phosphatase: 50 U/L (ref 39–117)
BUN: 23 mg/dL (ref 6–23)
CO2: 29 meq/L (ref 19–32)
Calcium: 8.8 mg/dL (ref 8.4–10.5)
Chloride: 105 meq/L (ref 96–112)
Creatinine, Ser: 1.49 mg/dL (ref 0.40–1.50)
GFR: 46.75 mL/min — ABNORMAL LOW (ref >60.00)
Glucose, Bld: 128 mg/dL — ABNORMAL HIGH (ref 70–99)
Potassium: 4.3 meq/L (ref 3.5–5.1)
Sodium: 140 meq/L (ref 135–145)
Total Bilirubin: 0.5 mg/dL (ref 0.2–1.2)
Total Protein: 6.4 g/dL (ref 6.0–8.3)

## 2023-09-15 NOTE — Assessment & Plan Note (Signed)
 Patient reports doing better with fewer lows with insulin  pump.  Working closely with Dr. Mercie.  A1c has improved to 7.2-continue current medication and endocrinology follow-up

## 2023-09-15 NOTE — Assessment & Plan Note (Signed)
 Noted

## 2023-09-15 NOTE — Progress Notes (Signed)
 Phone [phone]   Subjective:  John Carrillo is a 73 y.o. year old very pleasant male patient who presents for transitional care management and hospital follow up for syncope. Patient was hospitalized from 09/02/23 to 09/03/23. A TCM phone call was completed on 78/4/25. Medical complexity moderate   Summary: Patient presented to the hospital after episode of syncope.  Highest level concern was for vasovagal syncope.  Workup was reassuring.  Entresto  was held at discharge.  Slight elevation in creatinine compared to baseline even on discharge  Hospital course by problem: Syncope/fall-patient reported episode of chest pain and then had syncopal event. Prior to this was helping his son move (thought he was hydrating).   He was hypotensive with systolic blood pressure in the 90s at presentation.  Creatinine was elevated and there was appropriate concern for possible hypovolemia/dehydration.  After the chest pain felt a sudden urge to use bathroom- was able to urinate and defecate- started sweating profusely during this (got to sink to try to towel off and next thing he knew he was on the ground had fallen into cabinet- family came in quickly as heard a thud)-so concern this could be primarily vasovagal.  He had also been working out prior to this contributing to the concern for hypovolemia. - Paced rhythm on EKG -Pacemaker interrogated and no abnormal episodes -Cardiology was consulted-they were okay with holding Entresto - he later restarted after 3 days - Troponins mildly elevated but flat trend - CT chest abdomen pelvis without acute findings-did include contrast -Echocardiogram stable from prior -Orthostatics negative- slightly pushes against dehydration trigger -Had a fall associated with this and trauma surgery evaluated him but cleared from trauma perspective  Chest pain - Patient fell like it was similar to prior episode of pericarditis but no evidence on echocardiogram - Resolved  during hospitalization-felt like a burning through chest/back/arms.  Also some concern for neuropathic pain with possible spinal etiology-we held off on imaging as he did not have back or neck pain and symptoms resolved - Plan was for further workup if recurrence - had a asimilar episode in may but none since hospital. He wonders if dehydration related.   Acute kidney injury - Creatinine improved with IV fluid- from 2.19 down to 1.81 - Entresto  was being held - No hydronephrosis on CT - Recommended outpatient BMP which we will plan on today  CAD/hyperlipidemia - He was maintained on Plavix , coenzyme q10, Zetia  10 mg, rosuvastatin  10 mg daily -was not clearly CAD related- cleared by cardiology- continue to monitor . Lipids have been at goal Lab Results  Component Value Date   CHOL 136 07/14/2023   HDL 71 07/14/2023   LDLCALC 50 07/14/2023   LDLDIRECT 85.0 07/11/2017   TRIG 70 07/14/2023   CHOLHDL 1.9 07/14/2023   Heart failure with reduced ejection fraction with ICD in place - Ejection fraction remained at 40 to 45% compared to 2023 echocardiogram up slightly - Carvedilol  was continued as well as Farxiga  - Plan was to restart Entresto  after 3 days and patient reports- he has resteated   Type 1 diabetes mellitus - Remains on insulin .  A1c only mildly elevated-pump with far more rare blood sugars- exertion/timigng.  Continue close follow up with endocrinology Lab Results  Component Value Date   HGBA1C 7.2 (A) 07/16/2023   HGBA1C 7.9 (A) 04/08/2023   HGBA1C 8.0 (A) 01/08/2023   Hypothyroidism-was maintained on home levothyroxine  125 mcg-levels were normal 2 months ago-offered repeat  Lab Results  Component Value Date  TSH 1.51 07/14/2023   # Mild thrombocytopenia with other cell lines normal-stable during hospitalization-continue to monitor.    Incidental but important findings - 2.5 cm lesion in the lateral interpolar right kidney concerning for renal neoplasm-recommended  outpatient MRI with renal protocol-plan was for this to be arranged through urology- this is scheduled for the 26th- [facility]  -Hypodense focus in the head of the pancreas concerning for IPMN-recommend MRI pancreas with PCP or endocrine- this is getting done with MR abdomen on the 26th as well    See problem oriented charting as well  Past Medical History-  Patient Active Problem List   Diagnosis Date Noted   Type 1 diabetes mellitus with diabetic peripheral angiopathy without gangrene (HCC) 03/14/2020    Priority: High   Chronic systolic CHF (congestive heart failure) (HCC) 01/25/2015    Priority: High   Cardiomyopathy, ischemic 01/17/2015    Priority: High   History of CVA (cerebrovascular accident) 12/19/2006    Priority: High   CAD S/P LAD PCI'03, RCA DES 01/16/15 08/11/2006    Priority: High   Erectile dysfunction 05/14/2019    Priority: Medium    Left groin hernia 02/13/2018    Priority: Medium    Hypothyroidism 02/17/2015    Priority: Medium    Dyslipidemia 08/11/2006    Priority: Medium    Essential hypertension 08/11/2006    Priority: Medium    Allergic rhinitis 07/11/2017    Priority: Low   Epidermoid cyst of ear 10/22/2016    Priority: Low   LBBB (left bundle branch block) 01/17/2015    Priority: Low   SVT (supraventricular tachycardia) (HCC) 01/14/2015    Priority: Low   Former smoker 06/24/2014    Priority: Low   Eczema of both hands 12/15/2009    Priority: Low   Presence of heart assist device (HCC) 09/15/2023   Syncope 09/02/2023   Chronic systolic dysfunction of left ventricle 01/10/2016    Medications- reviewed and updated  A medical reconciliation was performed comparing current medicines to hospital discharge medications. Current Outpatient Medications  Medication Sig Dispense Refill   Blood Glucose Monitoring Suppl (ONETOUCH VERIO) w/Device KIT 1 Device by Does not apply route daily. 1 kit 0   carvedilol  (COREG ) 6.25 MG  tablet TAKE 1 TABLET(6.25 MG) BY MOUTH TWICE DAILY 180 tablet 2   CINNAMON PO Take 1 tablet by mouth daily.     clopidogrel  (PLAVIX ) 75 MG tablet Take 1 tablet (75 mg total) by mouth daily. 90 tablet 2   Coenzyme Q10 (COQ10) 200 MG CAPS Take 200 mg by mouth in the morning.     Continuous Glucose Sensor (DEXCOM G7 SENSOR) MISC USE FOR CONTINUOUS BLOOD GLUCOSE MONITORING. REPLACE EVERY 10 DAYS 3 each 3   dapagliflozin  propanediol (FARXIGA ) 10 MG TABS tablet Take 1 tablet (10mg ) by mouth daily before breakfast 90 tablet 0   ezetimibe  (ZETIA ) 10 MG tablet TAKE 1 TABLET(10 MG) BY MOUTH DAILY. 90 tablet 3   GARLIC PO Take 1 tablet by mouth daily.     Glucagon  (GVOKE HYPOPEN  2-PACK) 0.5 MG/0.1ML SOAJ Use for severe lows 0.2 mL 1   glucose blood (ONETOUCH VERIO) test strip 1 each by Other route 2 (two) times daily. And lancets 2/day 200 each 12   HUMALOG  KWIKPEN 100 UNIT/ML KwikPen INJECT UNDER THE SKIN 7 UNITS EVERY MORNING, 3 UNITS AT LUNCH, AND 7 UNITS AT DINNER. 30 mL 0   Insulin  Infusion Pump Supplies (TANDEM MOBI AUTOSOFT  XC KIT) MISC      insulin  lispro (HUMALOG ) 100 UNIT/ML injection Use 60 units per day via pump. 10 mL 11   Insulin  Lispro Prot & Lispro (HUMALOG  MIX 50/50 KWIKPEN) (50-50) 100 UNIT/ML Kwikpen Inject under the skin 7 units in every morning , 3 units at lunch and 7 units at dinner 30 mL 11   Insulin  Pen Needle (PEN NEEDLES) 32G X 4 MM MISC Use to inject insulin  4 times a day 100 each 2   levothyroxine  (SYNTHROID ) 125 MCG tablet Take 1 tablet (125 mcg total) by mouth daily. 90 tablet 3   Probiotic Product (PROBIOTIC PO) Take 1 capsule by mouth every morning.     rosuvastatin  (CRESTOR ) 10 MG tablet Take 1 tablet (10 mg total) by mouth daily. 90 tablet 3   sacubitril -valsartan  (ENTRESTO ) 49-51 MG Take 1 tablet by mouth 2 (two) times daily. 180 tablet 3   tadalafil  (CIALIS ) 5 MG tablet TAKE 1 TABLET(5 MG) BY MOUTH DAILY 90 tablet 3   No current facility-administered medications for  this visit.   Objective  Objective:  BP 110/68 (BP Location: Right Arm, Patient Position: Sitting, Cuff Size: Normal)   Pulse 60   Temp 98.4 F (36.9 C) (Temporal)   Ht 5' 9 (1.753 m)   Wt 173 lb 6.4 oz (78.7 kg)   SpO2 94%   BMI 25.61 kg/m  Gen: NAD, resting comfortably CV: RRR no murmurs rubs or gallops No chest wall pain Lungs: CTAB no crackles, wheeze, rhonchi Abdomen: soft/nontender/nondistended/normal bowel sounds. No rebound or guarding.  Ext: trace edema Skin: warm, dry Neuro: Wears hearing aids    Assessment and Plan:   #TCM/hospital follow up   # Social update-will be granted in 3 weeks-his oldest son is having a daughter-congratulated him!  His wife is dealing with PMR which has been challenging-on high-dose prednisone  and waiting on rheumatology Assessment & Plan Syncope, unspecified syncope type Suspected to be vasovagal with possible dehydration element.  Extensive workup was reassuring including echocardiogram, cardiac monitoring, troponin trend -Episode was preceded by chest pain but there has been no recurrence of chest pain the cause of this was unclear including MI ruled out.  Advised him to seek care immediately if recurrent chest pain-we also advise seeking care if presyncope or syncope obviously Chronic systolic CHF (congestive heart failure) (HCC) Ejection fraction actually improved since 2023 up to 40 to 45%.  No sign of fluid overload today.  He will maintain his carvedilol  6.25 mg twice daily, Farxiga  5 mg.  He has already restarted his Entresto  after 3 days off-continue current medication CAD S/P LAD PCI'03, RCA DES 01/16/15 His episode of chest pain was not thought to be cardiac in nature.  Thankfully no recurrence.  He thinks dehydration may have played a role and he plans to remain well-hydrated.  He will continue Zetia  10 mg, rosuvastatin  10 mg-max due to myalgias, clopidogrel  75 mg - Also has heart assist device with ICD and pacemaker Essential  hypertension Well-controlled on carvedilol  6.25 mg twice daily, Entresto  Dyslipidemia Lipids well-controlled on rosuvastatin  10 mg and Zetia  10 mg with LDL under 55-continue current medication-too soon for repeat Lab Results  Component Value Date   CHOL 136 07/14/2023   HDL 71 07/14/2023   LDLCALC 50 07/14/2023   LDLDIRECT 85.0 07/11/2017   TRIG 70 07/14/2023   CHOLHDL 1.9 07/14/2023   Type 1 diabetes mellitus with diabetic peripheral angiopathy without gangrene [facility]) Patient reports doing better with fewer lows with insulin  pump.  Working closely with Dr. Mercie.  A1c has improved to 7.2-continue current medication and endocrinology follow-up Hypothyroidism, unspecified type Well-controlled in June on levothyroxine  125 mcg-continue current medication Presence of heart assist device [facility]) Noted Acute kidney injury (HCC) Patient's GFR typically in the 50s and sometimes up into the 60s.  He knows to avoid NSAIDs.  Jump in creatinine up to 2.2 during hospitalization but was already trending down-hoping will get closer to baseline 1.2-1.4 with recheck today  As far as incidental findings of possible IPMN and renal lesion-MR abdomen has already been ordered the patient reports to be done on the 26th through urology-hoping will be forwarded a copy of results  Recommended follow up: Return for next already scheduled visit or sooner if needed. Future Appointments  Date Time Provider Department Center  09/30/2023 11:00 AM [facility]-MR 2 [facility]  10/08/2023  7:00 AM [facility] HVT DEVICE REMOTES [facility] H&V  11/18/2023  8:20 AM Thapa, [place], MD [facility] None  11/25/2023  8:00 AM Katrinka Garnette KIDD, MD [facility] P[facility]  01/07/2024  7:00 AM [facility] HVT DEVICE REMOTES [facility] H&V  07/27/2024  8:40 AM [facility] ANNUAL WELLNESS VISIT 1 [facility]    Lab/Order associations:   ICD-10-CM   1. Syncope, unspecified syncope type  R55     2. Chronic systolic CHF (congestive heart failure) (HCC)  I50.22 Comprehensive  metabolic panel with GFR    CBC with Differential/Platelet    3. CAD S/P LAD PCI'03, RCA DES 01/16/15  I25.10    Z98.61     4. Essential hypertension  I10 Comprehensive metabolic panel with GFR    CBC with Differential/Platelet    5. Dyslipidemia  E78.5     6. Type 1 diabetes mellitus with diabetic peripheral angiopathy without gangrene (HCC)  E10.51     7. Hypothyroidism, unspecified type  E03.9     8. Presence of heart assist device [facility]) Chronic Z95.811     9. Acute kidney injury (HCC)  N17.9      I personally spent a total of 45 minutes in the care of the patient today including preparing to see the patient, getting/reviewing separately obtained history, performing a medically appropriate exam/evaluation, counseling and educating, placing orders, documenting clinical information in the EHR, and communicating results.   Return precautions advised.  Garnette Katrinka, MD

## 2023-09-15 NOTE — Patient Instructions (Addendum)
 Please stop by lab before you go If you have mychart- we will send your results within 3 business days of us  receiving them.  If you do not have mychart- we will call you about results within 5 business days of us  receiving them.  *please also note that you will see labs on mychart as soon as they post. I will later go in and write notes on them- will say notes from Dr. Katrinka   Obviously any recurrence please seek care immediately but glad you are doing well  Recommended follow up: Return for next already scheduled visit or sooner if needed.

## 2023-09-15 NOTE — Assessment & Plan Note (Signed)
 Suspected to be vasovagal with possible dehydration element.  Extensive workup was reassuring including echocardiogram, cardiac monitoring, troponin trend -Episode was preceded by chest pain but there has been no recurrence of chest pain the cause of this was unclear including MI ruled out.  Advised him to seek care immediately if recurrent chest pain-we also advise seeking care if presyncope or syncope obviously

## 2023-09-15 NOTE — Assessment & Plan Note (Signed)
 Lipids well-controlled on rosuvastatin  10 mg and Zetia  10 mg with LDL under 55-continue current medication-too soon for repeat Lab Results  Component Value Date   CHOL 136 07/14/2023   HDL 71 07/14/2023   LDLCALC 50 07/14/2023   LDLDIRECT 85.0 07/11/2017   TRIG 70 07/14/2023   CHOLHDL 1.9 07/14/2023

## 2023-09-15 NOTE — Assessment & Plan Note (Signed)
 His episode of chest pain was not thought to be cardiac in nature.  Thankfully no recurrence.  He thinks dehydration may have played a role and he plans to remain well-hydrated.  He will continue Zetia  10 mg, rosuvastatin  10 mg-max due to myalgias, clopidogrel  75 mg - Also has heart assist device with ICD and pacemaker

## 2023-09-15 NOTE — Assessment & Plan Note (Signed)
 Well-controlled in June on levothyroxine  125 mcg-continue current medication

## 2023-09-15 NOTE — Assessment & Plan Note (Signed)
 Well-controlled on carvedilol  6.25 mg twice daily, Entresto 

## 2023-09-15 NOTE — Assessment & Plan Note (Signed)
 Ejection fraction actually improved since 2023 up to 40 to 45%.  No sign of fluid overload today.  He will maintain his carvedilol  6.25 mg twice daily, Farxiga  5 mg.  He has already restarted his Entresto  after 3 days off-continue current medication

## 2023-09-18 ENCOUNTER — Other Ambulatory Visit: Payer: Self-pay | Admitting: Endocrinology

## 2023-09-18 DIAGNOSIS — E1165 Type 2 diabetes mellitus with hyperglycemia: Secondary | ICD-10-CM

## 2023-09-26 NOTE — CV Procedure (Signed)
  Device system confirmed to be MRI conditional, with implant date > 6 weeks ago, and no evidence of abandoned or epicardial leads in review of most recent CXR  Device last cleared by EP Provider: Prentice Passey 09/25/23  Clearance is good through for 1 year as long as parameters remain stable at time of check. If pt undergoes a cardiac device procedure during that time, they should be re-cleared.   Tachy-therapies to be programmed off if applicable with device back to pre-MRI settings after completion of exam.  [employer] - Programming recommendation received through [employer] App/Tablet  [employer], RT  09/26/2023 8:07 AM

## 2023-09-27 ENCOUNTER — Other Ambulatory Visit: Payer: Self-pay | Admitting: Student

## 2023-09-30 ENCOUNTER — Ambulatory Visit (HOSPITAL_COMMUNITY)
Admission: RE | Admit: 2023-09-30 | Discharge: 2023-09-30 | Disposition: A | Discharge status: Home / Self Care | Source: Ambulatory Visit | Attending: Urology | Admitting: Urology

## 2023-09-30 DIAGNOSIS — D4101 Neoplasm of uncertain behavior of right kidney: Secondary | ICD-10-CM | POA: Diagnosis not present

## 2023-09-30 DIAGNOSIS — N281 Cyst of kidney, acquired: Secondary | ICD-10-CM | POA: Diagnosis not present

## 2023-09-30 DIAGNOSIS — I700 Atherosclerosis of aorta: Secondary | ICD-10-CM | POA: Diagnosis not present

## 2023-09-30 DIAGNOSIS — N2889 Other specified disorders of kidney and ureter: Secondary | ICD-10-CM | POA: Diagnosis not present

## 2023-09-30 MED ORDER — GADOBUTROL 1 MMOL/ML IV SOLN
7.5000 mL | Freq: Once | INTRAVENOUS | Status: AC | PRN
  Administered 2023-09-30: 7.5 mL via INTRAVENOUS

## 2023-09-30 NOTE — Progress Notes (Signed)
 Patient was monitored by this RN during MRI scan due to presence of a pacemaker. Cardiac rhythm was continuously monitored throughout the procedure. Prior to the start of the scan, the pacemaker was placed in MRI-safe mode by the MRI technician and/or pacemaker representative. Following the completion of the scan, the device was returned to its pre-MRI settings. Neurological status and orientation post-procedure were unchanged from baseline.   Pre-procedure Heart Rate (Prior to being placed in MRI safe mode):  60  Post-procedure Heart Rate (Once pacemaker is returned to baseline mode):  60

## 2023-10-03 ENCOUNTER — Other Ambulatory Visit: Payer: Self-pay | Admitting: Endocrinology

## 2023-10-03 DIAGNOSIS — E10319 Type 1 diabetes mellitus with unspecified diabetic retinopathy without macular edema: Secondary | ICD-10-CM

## 2023-10-08 ENCOUNTER — Ambulatory Visit: Payer: Medicare Other

## 2023-10-08 DIAGNOSIS — I472 Ventricular tachycardia, unspecified: Secondary | ICD-10-CM

## 2023-10-09 ENCOUNTER — Ambulatory Visit: Admitting: Internal Medicine

## 2023-10-09 VITALS — BP 110/70 | HR 66 | Temp 98.0°F | Ht 69.0 in | Wt 173.8 lb

## 2023-10-09 DIAGNOSIS — T162XXD Foreign body in left ear, subsequent encounter: Secondary | ICD-10-CM | POA: Diagnosis not present

## 2023-10-09 LAB — CUP PACEART REMOTE DEVICE CHECK
Battery Remaining Longevity: 18 mo
Battery Voltage: 2.89 V
Brady Statistic AP VP Percent: 64.38 %
Brady Statistic AP VS Percent: 0.43 %
Brady Statistic AS VP Percent: 34.64 %
Brady Statistic AS VS Percent: 0.55 %
Brady Statistic RA Percent Paced: 63.99 %
Brady Statistic RV Percent Paced: 72.59 %
Date Time Interrogation Session: 20250904003631
HighPow Impedance: 65 Ohm
Implantable Lead Connection Status: 753985
Implantable Lead Connection Status: 753985
Implantable Lead Connection Status: 753985
Implantable Lead Implant Date: 20171206
Implantable Lead Implant Date: 20171206
Implantable Lead Implant Date: 20171206
Implantable Lead Location: 753858
Implantable Lead Location: 753859
Implantable Lead Location: 753860
Implantable Lead Model: 4598
Implantable Lead Model: 5076
Implantable Pulse Generator Implant Date: 20171206
Lead Channel Impedance Value: 218.5 Ohm
Lead Channel Impedance Value: 231.878
Lead Channel Impedance Value: 235.98 Ohm
Lead Channel Impedance Value: 235.98 Ohm
Lead Channel Impedance Value: 251.66 Ohm
Lead Channel Impedance Value: 304 Ohm
Lead Channel Impedance Value: 342 Ohm
Lead Channel Impedance Value: 399 Ohm
Lead Channel Impedance Value: 437 Ohm
Lead Channel Impedance Value: 437 Ohm
Lead Channel Impedance Value: 494 Ohm
Lead Channel Impedance Value: 513 Ohm
Lead Channel Impedance Value: 513 Ohm
Lead Channel Impedance Value: 779 Ohm
Lead Channel Impedance Value: 817 Ohm
Lead Channel Impedance Value: 836 Ohm
Lead Channel Impedance Value: 836 Ohm
Lead Channel Impedance Value: 855 Ohm
Lead Channel Pacing Threshold Amplitude: 0.5 V
Lead Channel Pacing Threshold Amplitude: 1 V
Lead Channel Pacing Threshold Amplitude: 1.375 V
Lead Channel Pacing Threshold Pulse Width: 0.4 ms
Lead Channel Pacing Threshold Pulse Width: 0.4 ms
Lead Channel Pacing Threshold Pulse Width: 0.4 ms
Lead Channel Sensing Intrinsic Amplitude: 16.875 mV
Lead Channel Sensing Intrinsic Amplitude: 16.875 mV
Lead Channel Sensing Intrinsic Amplitude: 2.875 mV
Lead Channel Sensing Intrinsic Amplitude: 2.875 mV
Lead Channel Setting Pacing Amplitude: 2 V
Lead Channel Setting Pacing Amplitude: 2 V
Lead Channel Setting Pacing Amplitude: 2.5 V
Lead Channel Setting Pacing Pulse Width: 0.4 ms
Lead Channel Setting Pacing Pulse Width: 0.4 ms
Lead Channel Setting Sensing Sensitivity: 0.3 mV
Zone Setting Status: 755011
Zone Setting Status: 755011

## 2023-10-09 NOTE — Progress Notes (Signed)
 ==============================  [facility]: [phone]   -- Medical Office Visit --  Patient: John Carrillo      Age: 73 y.o.       Sex:  male  Date:   10/09/2023 Today's Healthcare Provider: Bernardino KANDICE Cone, MD  ==============================   Chief Complaint: Ear Pain (Pt has piece of hearing aid stuck in his ear ear dr states they could not do anything due to being on blood thinner pt states. Pt states could have been in there since April or may of this year. Ear dr is the one that found it the audiology )  Discussed the use of AI scribe software for clinical note transcription with the patient, who gave verbal consent to proceed.  History of Present Illness 73 year old male who presents with a piece of his hearing aid stuck in his ear.  During a routine audiologist appointment, a filter from his hearing aid was discovered to be stuck in his ear. The filter is a small component that attaches to the part of the hearing aid that goes into the ear and is held in place by a small bladder, which broke off, causing the filter to remain in his ear.  He has been using these hearing aids for almost three years. The issue with the filter was identified during an exam prior to an audio exam. The hearing aid unit had been sent back for repair in April to replace a grommet, and he had noticed some filters coming out when removing his hearing aids.  He has not experienced any pain or discomfort from the filter being in his ear. He is on blood thinners. He has not attempted any home remedies to remove the filter, such as flushing with water, and has opted to have it professionally removed. Photographs Taken 10/09/2023 :    PROCEDURE: CERUMEN DISIMPACTION   Otoscopic viewing of the tympanic membrane was initially obstructed by copious impacted cerumen in the external auditory canal, so disimpaction by irrigation was recommended.  The associated and risk of  tympanic membrane perforation was discussed and verbal consent was obtained prior to performing the procedure. The affected left auditory canal(s) were then irrigated by gentle ear lavage with successful removal of impacted cerumen as confirmed on post procedural repeat otoscopy.   Also, the tympanic membranes appeared to still be intact immediately following the procedure, while auditory canals were normal.   Patient tolerated well without complications.    After the procedure, the patient reported: complete resolution   Physical Exam:    10/09/2023    3:59 PM 09/15/2023   11:13 AM 09/03/2023   11:03 AM  Vitals with BMI  Height 5' 9 5' 9   Weight 173 lbs 13 oz 173 lbs 6 oz   BMI 25.65 25.6   Systolic 110 110 873  Diastolic 70 68 87  Pulse 66 60 60  Vital signs reviewed.  Nursing notes reviewed. Weight trend reviewed.  General Appearance:  No acute distress appreciable.   Well-groomed, healthy-appearing male.  Well proportioned with no abnormal fat distribution.  Good muscle tone. Pulmonary:  Normal work of breathing at rest, no respiratory distress apparent. SpO2: 98 %  Musculoskeletal: All extremities are intact.  Neurological:  Awake, alert, oriented, and engaged.  No obvious focal neurological deficits or cognitive impairments.  Sensorium seems unclouded.   Speech is clear and coherent with logical content. Psychiatric:  Appropriate mood, pleasant and cooperative demeanor, thoughtful and engaged  during the exam   Verbalized to patient: Physical Exam HEENT: Ears examined with digital otoscope. Foreign body in ear, piece of hearing aid filter stuck.     ASSESSMENT & PLAN   Assessment & Plan Foreign body in left auditory canal, subsequent encounter Foreign body in external ear canal identified by audiologist presents for removal. A hearing aid filter has been lodged in his external ear canal since April, discovered during a routine audiologist visit. Removal was not possible due to  blood thinner use and the audiologist's scope of practice. Plan to perform cureting to remove the foreign body, using a digital ear scope for visualization. A picture will be taken for the chart record. Risks of cureting, including ear canal scratching, discomfort, and potential eardrum perforation, were discussed, with pain being the most common complication. Verbal consent was obtained after discussing these risks. Less invasive measures may be considered if the foreign body is loose.     This document was synthesized by artificial intelligence (Abridge) using HIPAA-compliant recording of the clinical interaction;   We discussed the use of AI scribe software for clinical note transcription with the patient, who gave verbal consent to proceed. additional Info: This encounter employed state-of-the-art, real-time, collaborative documentation. The patient actively reviewed and assisted in updating their electronic medical record on a shared screen, ensuring transparency and facilitating joint problem-solving for the problem list, overview, and plan. This approach promotes accurate, informed care. The treatment plan was discussed and reviewed in detail, including medication safety, potential side effects, and all patient questions. We confirmed understanding and comfort with the plan. Follow-up instructions were established, including contacting the office for any concerns, returning if symptoms worsen, persist, or new symptoms develop, and precautions for potential emergency department visits.   Billing: For irrigation-only removal: bill the E/M visit only--there isn't a separate CPT for irrigating an ear-canal foreign body

## 2023-10-11 ENCOUNTER — Ambulatory Visit: Payer: Self-pay | Admitting: Cardiology

## 2023-10-18 NOTE — Progress Notes (Signed)
Remote ICD Transmission.

## 2023-11-10 ENCOUNTER — Other Ambulatory Visit: Payer: Self-pay | Admitting: Cardiovascular Disease

## 2023-11-18 ENCOUNTER — Encounter: Payer: Self-pay | Admitting: Endocrinology

## 2023-11-18 ENCOUNTER — Ambulatory Visit: Payer: Self-pay | Admitting: Endocrinology

## 2023-11-18 ENCOUNTER — Ambulatory Visit: Admitting: Endocrinology

## 2023-11-18 VITALS — BP 120/70 | HR 60 | Ht 69.0 in | Wt 172.8 lb

## 2023-11-18 DIAGNOSIS — E039 Hypothyroidism, unspecified: Secondary | ICD-10-CM

## 2023-11-18 DIAGNOSIS — E10319 Type 1 diabetes mellitus with unspecified diabetic retinopathy without macular edema: Secondary | ICD-10-CM

## 2023-11-18 LAB — POCT GLYCOSYLATED HEMOGLOBIN (HGB A1C): Hemoglobin A1C: 7.7 % — AB (ref 4.0–5.6)

## 2023-11-18 NOTE — Progress Notes (Signed)
 Outpatient Endocrinology Note John Nemesio Castrillon, MD  11/18/23  Patient's Name: John Carrillo    DOB: [birth year]    MRN: [id number]                                                    REASON OF VISIT: Follow up for type 1 diabetes mellitus  PCP: Katrinka Garnette KIDD, MD  HISTORY OF PRESENT ILLNESS:   John Carrillo is a 73 y.o. old male with past medical history listed below, is here for follow up of type 1 diabetes mellitus.    Pertinent Diabetes History: Patient was diagnosed with diabetes mellitus in 2008, managed as type 2 diabetes mellitus for several years, in August 2024 he had positive antibodies consistent with type 1 diabetes mellitus in August 2024.  Insulin  therapy was started soon after the diagnosis.  Prior to insulin  pump therapy used to be on basal bolus regimen with glargine and Humalog .  Not clear whether he was treated with metformin previously.  He had positive IA 2 and GAD 65 antibody positive consistent with type 1 diabetes mellitus in August 2024.  Patient was restarted on insulin  pump therapy Tandem mobi pump from end of July 2024.  Chronic Diabetes Complications : Retinopathy: Mild nonproliferative retinopathy, annually in 01/2023.  Following with ophthalmology. Nephropathy: no Peripheral neuropathy: no Coronary artery disease: yes Stroke: yes  Relevant comorbidities and cardiovascular risk factors: Obesity: no Body mass index is 25.52 kg/m.  Hypertension: yes Hyperlipidemia. Yes, on a statin.  Current / Home Diabetic regimen includes:  Farxiga  10 mg daily.  Tandem Mobi pump using Humalog  U100  Insulin  Pump setting:  Basal MN- 0.400u/hour 12PM- 0.500  6PM- 0.400  Bolus CHO Ratio (1unit:CHO) MN- 1:35 12PM-  1:30 6PM-    1:30  Correction/Sensitivity: MN- 1:55 12PM- 1:45 6PM-   1:50  Target: 120   Active insulin  time: 5 hours  Prior diabetic medications: Ozempic stopped due to nausea.  CONTINUOUS GLUCOSE MONITORING SYSTEM (CGMS) /  INSULIN  PUMP INTERPRETATION:                         Tandem Mobi Pump & Sensor Download (Reviewed and summarized below.) Pump: Dexcom G7 and Tandem t:slim /Mobi Dates: October 1 to November 18, 2023, 14 days  Glucose Management Indicator: 7.8%      Average total daily insulin :  34 units, Basal:47%, Bolus: 43%,  (Manual Bolus:46%,  Control IQ Bolus: 54%)    Trends:  Frequent postprandial hyperglycemia with blood sugar 200- 290 range, related to not enough meal bolus, no meal bolus usually bolusing for meals with manual boluses.  Blood sugar in between the meals acceptable.  Frequent hyperglycemia overnight and he has been using sleep mode for overnight.  No hypoglycemia.  Hypoglycemia: Patient has no hypoglycemic episodes. Patient has hypoglycemia awareness.    Factors modifying glucose control: 1.  Diabetic diet assessment: 3 meals a day.  2.  Staying active or exercising: Exercising at gym.  3.  Medication compliance: compliant all of the time.  # Hypothyroidism : He was diagnosed with hypothyroidism in 2008 and has been on thyroid  hormone replacement since the diagnosis.  He is currently taking levothyroxine  125 mcg daily.   Interval history  Insulin  pump and CGM data as reviewed above.  Mostly postprandial hyperglycemia.  He  changed pump setting as noted above, reports due to hypoglycemia.  Bolus setting is significantly low /light on carb ratio.  Hemoglobin A1c 7.7%.  He reports he has ill wife at home, stressful time.  He has been taking levothyroxine  120 g daily.  No hypo and hyperthyroid symptoms.  No other complaints today.  REVIEW OF SYSTEMS As per history of present illness.   PAST MEDICAL HISTORY: Past Medical History:  Diagnosis Date   Acute kidney injury superimposed on chronic kidney disease 01/25/2015   AICD (automatic cardioverter/defibrillator) present    CHF (congestive heart failure) (HCC)    CORONARY ARTERY DISEASE 08/11/2006   CVA 12/19/2006   2008    DIABETES MELLITUS, TYPE I 08/11/2006   ECZEMA, HANDS 12/15/2009   History of kidney stones    10+ years ago   HYPERLIPIDEMIA 08/11/2006   HYPERTENSION 08/11/2006   Hypothyroidism    Myocardial infarction [facility])    Presence of permanent cardiac pacemaker    Type 2 diabetes mellitus without complications (HCC) 12/20/2016    PAST SURGICAL HISTORY: Past Surgical History:  Procedure Laterality Date   CARDIAC CATHETERIZATION N/A 01/16/2015   Procedure: Left Heart Cath and Coronary Angiography;  Surgeon: Dorn JINNY Lesches, MD;  Location: [facility] INVASIVE CV LAB;  Service: Cardiovascular;  Laterality: N/A;   CORONARY STENT PLACEMENT     EP IMPLANTABLE DEVICE N/A 01/10/2016   MDT Claria MRI Quad CRTD implanted by Dr Kelsie   HERNIA REPAIR     INGUINAL HERNIA REPAIR Left 05/22/2020   Procedure: LEFT INGUINAL HERNIA REPAIR WITH MESH;  Surgeon: Vernetta Berg, MD;  Location: [facility] OR;  Service: General;  Laterality: Left;   INSERT / REPLACE / REMOVE PACEMAKER     INSERTION OF MESH Left 05/22/2020   Procedure: INSERTION OF MESH;  Surgeon: Vernetta Berg, MD;  Location: [facility] OR;  Service: General;  Laterality: Left;   IR ANGIOGRAM PELVIS SELECTIVE OR SUPRASELECTIVE  08/28/2022   IR ANGIOGRAM SELECTIVE EACH ADDITIONAL VESSEL  08/28/2022   IR ANGIOGRAM SELECTIVE EACH ADDITIONAL VESSEL  08/28/2022   IR ANGIOGRAM SELECTIVE EACH ADDITIONAL VESSEL  08/28/2022   IR EMBO TUMOR ORGAN ISCHEMIA INFARCT INC GUIDE ROADMAPPING  08/28/2022   IR US  GUIDE VASC ACCESS LEFT  08/28/2022   IR US  GUIDE VASC ACCESS LEFT  08/28/2022   IR US  GUIDE VASC ACCESS LEFT  08/28/2022   IR US  GUIDE VASC ACCESS RIGHT  08/28/2022   TEE WITHOUT CARDIOVERSION N/A 01/26/2015   Procedure: TRANSESOPHAGEAL ECHOCARDIOGRAM (TEE);  Surgeon: Jerel Balding, MD;  Location: [facility] ENDOSCOPY;  Service: Cardiovascular;  Laterality: N/A;   TONSILECTOMY, ADENOIDECTOMY, BILATERAL MYRINGOTOMY AND TUBES     TONSILLECTOMY     WISDOM TOOTH EXTRACTION     WRIST  FRACTURE SURGERY Right 1960   3rd grade    ALLERGIES: No Known Allergies  FAMILY HISTORY:  Family History  Problem Relation Age of Onset   CAD Father    Other Mother        passed right before 32- tumor on kidney recent diagnosis   Dementia Mother        progressing rapidly   CAD Paternal Grandfather        35s   CAD Paternal Grandmother        67s   Brain cancer Maternal Grandmother    Lymphoma Sister        b cell   Celiac disease Sister    Diabetes Neg Hx     SOCIAL HISTORY: Social History  Socioeconomic History   Marital status: Married    Spouse name: Not on file   Number of children: Not on file   Years of education: Not on file   Highest education level: Master's degree (e.g., MA, MS, MEng, MEd, MSW, MBA)  Occupational History   Occupation: Engineer, maintenance: [employer]  Tobacco Use   Smoking status: Former    Current packs/day: 0.00    Average packs/day: 1 pack/day for 30.0 years (30.0 ttl pk-yrs)    Types: Cigarettes    Start date: 05/04/1970    Quit date: 05/03/2000    Years since quitting: 23.5   Smokeless tobacco: Never  Vaping Use   Vaping status: Never Used  Substance and Sexual Activity   Alcohol use: Yes    Alcohol/week: 2.0 standard drinks of alcohol    Types: 1 Glasses of wine, 1 Shots of liquor per week    Comment: 1-2/month   Drug use: No   Sexual activity: Yes    Birth control/protection: I.U.D.  Other Topics Concern   Not on file  Social History Narrative   Married (wife seen elsewhere- still working in Catering manager 2024). 3 step children, 1 biological.  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      Hobbies: race sail boats, adrenaline related activities   Social Drivers of Corporate investment banker Strain: Low Risk  (10/09/2023)   Overall Financial Resource Strain (CARDIA)    Difficulty of Paying Living Expenses: Not hard  at all  Food Insecurity: No Food Insecurity (10/09/2023)   Hunger Vital Sign    Worried About Running Out of Food in the Last Year: Never true    Ran Out of Food in the Last Year: Never true  Transportation Needs: No Transportation Needs (10/09/2023)   PRAPARE - Administrator, Civil Service (Medical): No    Lack of Transportation (Non-Medical): No  Physical Activity: Sufficiently Active (10/09/2023)   Exercise Vital Sign    Days of Exercise per Week: 5 days    Minutes of Exercise per Session: 60 min  Stress: No Stress Concern Present (10/09/2023)   [employer] of Occupational Health - Occupational Stress Questionnaire    Feeling of Stress: Not at all  Social Connections: Moderately Integrated (10/09/2023)   Social Connection and Isolation Panel    Frequency of Communication with Friends and Family: More than three times a week    Frequency of Social Gatherings with Friends and Family: Once a week    Attends Religious Services: Patient declined    Database administrator or Organizations: Yes    Attends Engineer, structural: More than 4 times per year    Marital Status: Married    MEDICATIONS:  Current Outpatient Medications  Medication Sig Dispense Refill   Blood Glucose Monitoring Suppl (ONETOUCH VERIO) w/Device KIT 1 Device by Does not apply route daily. 1 kit 0   carvedilol  (COREG ) 6.25 MG tablet TAKE 1 TABLET(6.25 MG) BY MOUTH TWICE DAILY 180 tablet 2   CINNAMON PO Take 1 tablet by mouth daily.     clopidogrel  (PLAVIX ) 75 MG tablet TAKE 1 TABLET(75 MG) BY MOUTH DAILY 30 tablet 0   Coenzyme Q10 (COQ10) 200 MG CAPS Take 200 mg by mouth in the morning.     Continuous Glucose Sensor (DEXCOM G7 SENSOR) MISC USE FOR CONTINUOUS BLOOD GLUCOSE MONITORING. REPLACE EVERY  10 DAYS 3 each 3   dapagliflozin  propanediol (FARXIGA ) 10 MG TABS tablet TAKE 1 TABLET(10 MG) BY MOUTH DAILY BEFORE BREAKFAST 90 tablet 3   ezetimibe  (ZETIA ) 10 MG tablet TAKE 1 TABLET(10 MG) BY MOUTH  DAILY. 90 tablet 3   GARLIC PO Take 1 tablet by mouth daily.     Glucagon  (GVOKE HYPOPEN  2-PACK) 0.5 MG/0.1ML SOAJ Use for severe lows 0.2 mL 1   glucose blood (ONETOUCH VERIO) test strip 1 each by Other route 2 (two) times daily. And lancets 2/day 200 each 12   HUMALOG  KWIKPEN 100 UNIT/ML KwikPen INJECT UNDER THE SKIN 7 UNITS EVERY MORNING, 3 UNITS AT LUNCH, AND 7 UNITS AT DINNER. 30 mL 0   Insulin  Infusion Pump Supplies (TANDEM MOBI AUTOSOFT XC KIT) MISC      insulin  lispro (HUMALOG ) 100 UNIT/ML injection INJECT 60 UNITS VIA PUMP DAILY 10 mL 11   Insulin  Lispro Prot & Lispro (HUMALOG  MIX 50/50 KWIKPEN) (50-50) 100 UNIT/ML Kwikpen Inject under the skin 7 units in every morning , 3 units at lunch and 7 units at dinner 30 mL 11   Insulin  Pen Needle (PEN NEEDLES) 32G X 4 MM MISC Use to inject insulin  4 times a day 100 each 2   levothyroxine  (SYNTHROID ) 125 MCG tablet Take 1 tablet (125 mcg total) by mouth daily. 90 tablet 3   Probiotic Product (PROBIOTIC PO) Take 1 capsule by mouth every morning.     rosuvastatin  (CRESTOR ) 10 MG tablet Take 1 tablet (10 mg total) by mouth daily. 90 tablet 3   sacubitril -valsartan  (ENTRESTO ) 49-51 MG Take 1 tablet by mouth 2 (two) times daily. 180 tablet 3   tadalafil  (CIALIS ) 5 MG tablet TAKE 1 TABLET(5 MG) BY MOUTH DAILY 90 tablet 3   No current facility-administered medications for this visit.    PHYSICAL EXAM: Vitals:   11/18/23 0822  BP: 120/70  Pulse: 60  SpO2: 95%  Weight: 172 lb 12.8 oz (78.4 kg)  Height: 5' 9 (1.753 m)     Body mass index is 25.52 kg/m.  Wt Readings from Last 3 Encounters:  11/18/23 172 lb 12.8 oz (78.4 kg)  10/09/23 173 lb 12.8 oz (78.8 kg)  09/15/23 173 lb 6.4 oz (78.7 kg)    General: Well developed, well nourished male in no apparent distress.  HEENT: AT/[place], no external lesions.  Eyes: Conjunctiva clear and no icterus. Neck: Neck supple  Lungs: Respirations not labored Neurologic: Alert, oriented, normal  speech Extremities / Skin: Dry. Pump site okay.  Psychiatric: Does not appear depressed or anxious  Diabetic Foot Exam - Simple   No data filed    LABS Reviewed Lab Results  Component Value Date   HGBA1C 7.7 (A) 11/18/2023   HGBA1C 7.2 (A) 07/16/2023   HGBA1C 7.9 (A) 04/08/2023   Lab Results  Component Value Date   FRUCTOSAMINE 319 (H) 06/18/2022   FRUCTOSAMINE 413 (H) 11/24/2015   Lab Results  Component Value Date   CHOL 136 07/14/2023   HDL 71 07/14/2023   LDLCALC 50 07/14/2023   LDLDIRECT 85.0 07/11/2017   TRIG 70 07/14/2023   CHOLHDL 1.9 07/14/2023   Lab Results  Component Value Date   MICRALBCREAT NOTE 07/14/2023   MICRALBCREAT 6.4 04/21/2009   Lab Results  Component Value Date   CREATININE 1.49 09/15/2023   Lab Results  Component Value Date   GFR 46.75 (L) 09/15/2023    Latest Reference Range & Units 10/04/22 10:35  IA-2 Antibody <5.4 U/mL 8.3 (H)  ZNT8 Antibodies <15 U/mL <10  Glutamic Acid Decarb Ab <5 IU/mL 19 (H)  (H): Data is abnormally high  ASSESSMENT / PLAN  1. Type 1 diabetes mellitus with retinopathy, macular edema presence unspecified, unspecified laterality, unspecified retinopathy severity (HCC)   2. Primary hypothyroidism     Diabetes Mellitus type 1, complicated by diabetic retinopathy.  He had type I antibodies positive consistent with type 1 diabetes mellitus.  - Diabetic status / severity: Uncontrolled, worsening  Lab Results  Component Value Date   HGBA1C 7.7 (A) 11/18/2023    - Hemoglobin A1c goal <6.5%   - Medications:   Discussed about bolusing for meals before eating with carb count to prevent postprandial hyperglycemia.  Discussed that to make the diabetes control better and need to bolus for meals prior to eating.  Advised to use pump bolus setting for meal boluses.  Adjusted pump setting as follows : He is mostly having postprandial hyperglycemia.  Changed bolus setting as follows.  Tandem Mobi pump using Humalog   U100  Insulin  Pump setting:  Basal MN- 0.400u/hour 12PM- 0.500  6PM- 0.400  Bolus CHO Ratio (1unit:CHO) MN- 1:35, changed to  1:20 12PM-  1:30, changed to  1:20 6PM-    1:30, changed to  1:20  Correction/Sensitivity: MN- 1:55, changed to 1:45 12PM- 1:45, changed to 1:45 6PM-   1:50, changed to 1:45  Target: 120   Active insulin  time: 5 hours  Advised not to use sleep more as he is having frequent hyperglycemia overnight.  - Home glucose testing: continue CGM and check blood glucose as needed.  - Discussed/ Gave Hypoglycemia treatment plan.  # Consult : not required at this time.   # Annual urine for microalbuminuria/ creatinine ratio, no microalbuminuria currently. Last  Lab Results  Component Value Date   MICRALBCREAT NOTE 07/14/2023    # Foot check nightly / neuropathy.  # Annual dilated diabetic eye exams.   - Diet: Make healthy diabetic food choices - Life style / activity / exercise: discussed.  2. Blood pressure  -  BP Readings from Last 1 Encounters:  11/18/23 120/70    - Control is in target.  - No change in current plans.  3. Lipid status / Hyperlipidemia - Last  Lab Results  Component Value Date   LDLCALC 50 07/14/2023   - Continue rosuvastatin  20 mg daily and Zetia  10 mg daily, managed by cardiology.    # Primary hypothyroidism -Currently taking levothyroxine  125 mcg daily.  Annual thyroid  lab.  Diagnoses and all orders for this visit:  Type 1 diabetes mellitus with retinopathy, macular edema presence unspecified, unspecified laterality, unspecified retinopathy severity (HCC) -     POCT glycosylated hemoglobin (Hb A1C)  Primary hypothyroidism   DISPOSITION Follow up in clinic in 4 months suggested.  Labs on the same day of the visit.  All questions answered and patient verbalized understanding of the plan.  John Draxton Luu, MD [facility] Endocrinology [facility] [address], [address] [place], [place] [address] Phone  # [phone]  At least part of this note was generated using voice recognition software. Inadvertent word errors may have occurred, which were not recognized during the proofreading process.

## 2023-11-19 ENCOUNTER — Other Ambulatory Visit: Payer: Self-pay | Admitting: Cardiovascular Disease

## 2023-11-25 ENCOUNTER — Ambulatory Visit: Payer: Self-pay | Admitting: Family Medicine

## 2023-11-25 ENCOUNTER — Encounter: Payer: Self-pay | Admitting: Family Medicine

## 2023-11-25 ENCOUNTER — Telehealth: Payer: Self-pay

## 2023-11-25 ENCOUNTER — Ambulatory Visit: Admitting: Family Medicine

## 2023-11-25 ENCOUNTER — Other Ambulatory Visit: Payer: Self-pay

## 2023-11-25 VITALS — BP 118/68 | HR 68 | Temp 97.4°F | Ht 69.0 in | Wt 173.6 lb

## 2023-11-25 DIAGNOSIS — I10 Essential (primary) hypertension: Secondary | ICD-10-CM

## 2023-11-25 DIAGNOSIS — Z0000 Encounter for general adult medical examination without abnormal findings: Secondary | ICD-10-CM | POA: Diagnosis not present

## 2023-11-25 DIAGNOSIS — E785 Hyperlipidemia, unspecified: Secondary | ICD-10-CM

## 2023-11-25 DIAGNOSIS — Z9861 Coronary angioplasty status: Secondary | ICD-10-CM

## 2023-11-25 DIAGNOSIS — E039 Hypothyroidism, unspecified: Secondary | ICD-10-CM | POA: Diagnosis not present

## 2023-11-25 DIAGNOSIS — E875 Hyperkalemia: Secondary | ICD-10-CM

## 2023-11-25 DIAGNOSIS — Z125 Encounter for screening for malignant neoplasm of prostate: Secondary | ICD-10-CM

## 2023-11-25 DIAGNOSIS — I2510 Atherosclerotic heart disease of native coronary artery without angina pectoris: Secondary | ICD-10-CM | POA: Diagnosis not present

## 2023-11-25 DIAGNOSIS — E1051 Type 1 diabetes mellitus with diabetic peripheral angiopathy without gangrene: Secondary | ICD-10-CM | POA: Diagnosis not present

## 2023-11-25 DIAGNOSIS — I5022 Chronic systolic (congestive) heart failure: Secondary | ICD-10-CM | POA: Diagnosis not present

## 2023-11-25 LAB — COMPREHENSIVE METABOLIC PANEL WITH GFR
ALT: 27 U/L (ref 0–53)
AST: 26 U/L (ref 0–37)
Albumin: 4.3 g/dL (ref 3.5–5.2)
Alkaline Phosphatase: 52 U/L (ref 39–117)
BUN: 23 mg/dL (ref 6–23)
CO2: 30 meq/L (ref 19–32)
Calcium: 9.7 mg/dL (ref 8.4–10.5)
Chloride: 106 meq/L (ref 96–112)
Creatinine, Ser: 1.5 mg/dL (ref 0.40–1.50)
GFR: 46.31 mL/min — ABNORMAL LOW (ref >60.00)
Glucose, Bld: 159 mg/dL — ABNORMAL HIGH (ref 70–99)
Potassium: 6 meq/L (ref 3.5–5.1)
Sodium: 146 meq/L — ABNORMAL HIGH (ref 135–145)
Total Bilirubin: 0.7 mg/dL (ref 0.2–1.2)
Total Protein: 6.8 g/dL (ref 6.0–8.3)

## 2023-11-25 LAB — CBC WITH DIFFERENTIAL/PLATELET
Basophils Absolute: 0 K/uL (ref 0.0–0.1)
Basophils Relative: 0.5 % (ref 0.0–3.0)
Eosinophils Absolute: 0.4 K/uL (ref 0.0–0.7)
Eosinophils Relative: 4.5 % (ref 0.0–5.0)
HCT: 49.2 % (ref 39.0–52.0)
Hemoglobin: 16.3 g/dL (ref 13.0–17.0)
Lymphocytes Relative: 24.9 % (ref 12.0–46.0)
Lymphs Abs: 2 K/uL (ref 0.7–4.0)
MCHC: 33 g/dL (ref 30.0–36.0)
MCV: 98.7 fl (ref 78.0–100.0)
Monocytes Absolute: 1.1 K/uL — ABNORMAL HIGH (ref 0.1–1.0)
Monocytes Relative: 13.7 % — ABNORMAL HIGH (ref 3.0–12.0)
Neutro Abs: 4.6 K/uL (ref 1.4–7.7)
Neutrophils Relative %: 56.4 % (ref 43.0–77.0)
Platelets: 165 K/uL (ref 150.0–400.0)
RBC: 4.99 Mil/uL (ref 4.22–5.81)
RDW: 13.4 % (ref 11.5–15.5)
WBC: 8.2 K/uL (ref 4.0–10.5)

## 2023-11-25 LAB — TSH: TSH: 0.96 u[IU]/mL (ref 0.35–5.50)

## 2023-11-25 NOTE — Progress Notes (Signed)
 Phone: [phone]   Subjective:  Patient presents today for their annual physical. Chief complaint-noted.   See problem oriented charting- ROS- full  review of systems was completed and negative  except for topics noted under acute/chronic concerns  The following were reviewed and entered/updated in epic: Past Medical History:  Diagnosis Date   Acute kidney injury superimposed on chronic kidney disease 01/25/2015   AICD (automatic cardioverter/defibrillator) present    CHF (congestive heart failure) (HCC)    CORONARY ARTERY DISEASE 08/11/2006   CVA 12/19/2006   2008   DIABETES MELLITUS, TYPE I 08/11/2006   ECZEMA, HANDS 12/15/2009   History of kidney stones    10+ years ago   HYPERLIPIDEMIA 08/11/2006   HYPERTENSION 08/11/2006   Hypothyroidism    Myocardial infarction [facility])    Presence of permanent cardiac pacemaker    Type 2 diabetes mellitus without complications (HCC) 12/20/2016   Patient Active Problem List   Diagnosis Date Noted   Type 1 diabetes mellitus with diabetic peripheral angiopathy without gangrene (HCC) 03/14/2020    Priority: High   Chronic systolic CHF (congestive heart failure) (HCC) 01/25/2015    Priority: High   Cardiomyopathy, ischemic 01/17/2015    Priority: High   History of CVA (cerebrovascular accident) 12/19/2006    Priority: High   CAD S/P LAD PCI'03, RCA DES 01/16/15 08/11/2006    Priority: High   Erectile dysfunction 05/14/2019    Priority: Medium    Left groin hernia 02/13/2018    Priority: Medium    Hypothyroidism 02/17/2015    Priority: Medium    Dyslipidemia 08/11/2006    Priority: Medium    Essential hypertension 08/11/2006    Priority: Medium    Allergic rhinitis 07/11/2017    Priority: Low   Epidermoid cyst of ear 10/22/2016    Priority: Low   LBBB (left bundle branch block) 01/17/2015    Priority: Low   SVT (supraventricular tachycardia) 01/14/2015    Priority: Low   Former smoker 06/24/2014    Priority: Low   Eczema of  both hands 12/15/2009    Priority: Low   Presence of heart assist device (HCC) 09/15/2023   Syncope 09/02/2023   Chronic systolic dysfunction of left ventricle 01/10/2016   Past Surgical History:  Procedure Laterality Date   CARDIAC CATHETERIZATION N/A 01/16/2015   Procedure: Left Heart Cath and Coronary Angiography;  Surgeon: Dorn JINNY Lesches, MD;  Location: [facility] INVASIVE CV LAB;  Service: Cardiovascular;  Laterality: N/A;   CORONARY STENT PLACEMENT     EP IMPLANTABLE DEVICE N/A 01/10/2016   MDT Claria MRI Quad CRTD implanted by Dr Kelsie   HERNIA REPAIR     INGUINAL HERNIA REPAIR Left 05/22/2020   Procedure: LEFT INGUINAL HERNIA REPAIR WITH MESH;  Surgeon: Vernetta Berg, MD;  Location: [facility] OR;  Service: General;  Laterality: Left;   INSERT / REPLACE / REMOVE PACEMAKER     INSERTION OF MESH Left 05/22/2020   Procedure: INSERTION OF MESH;  Surgeon: Vernetta Berg, MD;  Location: [facility] OR;  Service: General;  Laterality: Left;   IR ANGIOGRAM PELVIS SELECTIVE OR SUPRASELECTIVE  08/28/2022   IR ANGIOGRAM SELECTIVE EACH ADDITIONAL VESSEL  08/28/2022   IR ANGIOGRAM SELECTIVE EACH ADDITIONAL VESSEL  08/28/2022   IR ANGIOGRAM SELECTIVE EACH ADDITIONAL VESSEL  08/28/2022   IR EMBO TUMOR ORGAN ISCHEMIA INFARCT INC GUIDE ROADMAPPING  08/28/2022   IR US  GUIDE VASC ACCESS LEFT  08/28/2022   IR US  GUIDE VASC ACCESS LEFT  08/28/2022   IR US   GUIDE VASC ACCESS LEFT  08/28/2022   IR US  GUIDE VASC ACCESS RIGHT  08/28/2022   TEE WITHOUT CARDIOVERSION N/A 01/26/2015   Procedure: TRANSESOPHAGEAL ECHOCARDIOGRAM (TEE);  Surgeon: Jerel Balding, MD;  Location: [facility] ENDOSCOPY;  Service: Cardiovascular;  Laterality: N/A;   TONSILECTOMY, ADENOIDECTOMY, BILATERAL MYRINGOTOMY AND TUBES     TONSILLECTOMY     WISDOM TOOTH EXTRACTION     WRIST FRACTURE SURGERY Right 1960   3rd grade    Family History  Problem Relation Age of Onset   CAD Father    Other Mother        passed right before 37- tumor on kidney  recent diagnosis   Dementia Mother        progressing rapidly   CAD Paternal Grandfather        2s   CAD Paternal Grandmother        38s   Brain cancer Maternal Grandmother    Lymphoma Sister        b cell   Celiac disease Sister    Diabetes Neg Hx     Medications- reviewed and updated Current Outpatient Medications  Medication Sig Dispense Refill   Blood Glucose Monitoring Suppl (ONETOUCH VERIO) w/Device KIT 1 Device by Does not apply route daily. 1 kit 0   carvedilol  (COREG ) 6.25 MG tablet TAKE 1 TABLET(6.25 MG) BY MOUTH TWICE DAILY 180 tablet 2   CINNAMON PO Take 1 tablet by mouth daily.     clopidogrel  (PLAVIX ) 75 MG tablet TAKE 1 TABLET(75 MG) BY MOUTH DAILY 30 tablet 0   Coenzyme Q10 (COQ10) 200 MG CAPS Take 200 mg by mouth in the morning.     Continuous Glucose Sensor (DEXCOM G7 SENSOR) MISC USE FOR CONTINUOUS BLOOD GLUCOSE MONITORING. REPLACE EVERY 10 DAYS 3 each 3   dapagliflozin  propanediol (FARXIGA ) 10 MG TABS tablet TAKE 1 TABLET(10 MG) BY MOUTH DAILY BEFORE BREAKFAST 90 tablet 3   ezetimibe  (ZETIA ) 10 MG tablet TAKE 1 TABLET(10 MG) BY MOUTH DAILY. 90 tablet 3   GARLIC PO Take 1 tablet by mouth daily.     Glucagon  (GVOKE HYPOPEN  2-PACK) 0.5 MG/0.1ML SOAJ Use for severe lows 0.2 mL 1   glucose blood (ONETOUCH VERIO) test strip 1 each by Other route 2 (two) times daily. And lancets 2/day 200 each 12   HUMALOG  KWIKPEN 100 UNIT/ML KwikPen INJECT UNDER THE SKIN 7 UNITS EVERY MORNING, 3 UNITS AT LUNCH, AND 7 UNITS AT DINNER. 30 mL 0   Insulin  Infusion Pump Supplies (TANDEM MOBI AUTOSOFT XC KIT) MISC      insulin  lispro (HUMALOG ) 100 UNIT/ML injection INJECT 60 UNITS VIA PUMP DAILY 10 mL 11   Insulin  Lispro Prot & Lispro (HUMALOG  MIX 50/50 KWIKPEN) (50-50) 100 UNIT/ML Kwikpen Inject under the skin 7 units in every morning , 3 units at lunch and 7 units at dinner 30 mL 11   Insulin  Pen Needle (PEN NEEDLES) 32G X 4 MM MISC Use to inject insulin  4 times a day 100 each 2    levothyroxine  (SYNTHROID ) 125 MCG tablet Take 1 tablet (125 mcg total) by mouth daily. 90 tablet 3   Probiotic Product (PROBIOTIC PO) Take 1 capsule by mouth every morning.     rosuvastatin  (CRESTOR ) 10 MG tablet Take 1 tablet (10 mg total) by mouth daily. 90 tablet 3   sacubitril -valsartan  (ENTRESTO ) 49-51 MG TAKE 1 TABLET BY MOUTH TWICE DAILY. 60 tablet 0   tadalafil  (CIALIS ) 5 MG tablet TAKE 1 TABLET(5 MG) BY MOUTH DAILY  90 tablet 3   No current facility-administered medications for this visit.    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 3 step children, 1 biological.  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 Oropharynx normal Neck: no thyromegaly CV: RRR no murmurs rubs or gallops Lungs: CTAB no crackles, wheeze, rhonchi Abdomen: soft/nontender/nondistended/normal bowel sounds. No rebound or guarding.  Ext: no edema Skin: warm, dry Neuro: grossly normal, moves all extremities, PERRLA   Assessment and Plan  73 y.o. male presenting for annual physical.  Health Maintenance counseling: 1. Anticipatory guidance: Patient counseled regarding regular dental exams -q6 months, eye exams -yearly,  avoiding smoking and second hand smoke , limiting alcohol to 2 beverages per day - 2 a month mostly, no illicit drugs .   2. Risk factor reduction:  Advised patient of need for regular exercise and diet rich and fruits and  vegetables to reduce risk of heart attack and stroke.  Exercise- gym just once a week caring for wife and new grandbaby- hoping when things settle to increase again.  Diet/weight management-pretty healthy weight for muscle mass- but thinks losing some muscle mass.  Wt Readings from Last 3 Encounters:  11/25/23 173 lb 9.6 oz (78.7 kg)  11/18/23 172 lb 12.8 oz (78.4 kg)  10/09/23 173 lb 12.8 oz (78.8 kg)  3. Immunizations/screenings/ancillary studies- plans to update COVID at pharmacy. Flu shot today Immunization History  Administered Date(s) Administered   INFLUENZA, HIGH DOSE SEASONAL PF 11/08/2021, 11/14/2022   Influenza Split 11/15/2010, 11/15/2011   Influenza Whole 10/23/2007, 11/04/2009, 11/04/2012   Influenza,inj,Quad PF,6+ Mos 10/28/2014   Influenza-Unspecified 10/05/2013, 10/16/2016, 11/19/2017, 12/03/2019, 11/07/2020   PFIZER Comirnaty(Gray Top)Covid-19 Tri-Sucrose Vaccine 10/27/2021, 07/15/2022   PFIZER(Purple Top)SARS-COV-2 Vaccination 03/13/2019, 04/07/2019, 10/30/2019, 10/11/2020   Pfizer(Comirnaty)Fall Seasonal Vaccine 12 years and older 01/01/2023   Pneumococcal Conjugate-13 06/24/2014   Pneumococcal Polysaccharide-23 07/07/2008, 01/03/2017   Respiratory Syncytial Virus Vaccine,Recomb Aduvanted(Arexvy) 11/14/2022   Tdap 11/15/2010, 07/15/2022   Zoster Recombinant(Shingrix ) 02/20/2018, 04/28/2018   Zoster, Live 11/15/2011   4. Prostate cancer screening-  PSA decision with urology- has visit in December- high levels due to high prostate- but right now focus on his kidneys. History of prostate artery embolization  5. Colon cancer screening - Cologuard negative - June 2024 good through 2027 6. Skin cancer screening- no dermatologist. advised regular sunscreen use. Denies worrisome, changing, or new skin lesions.  7. Smoking associated screening (lung cancer screening, AAA screen 65-75, UA)- former smoker- 30 pack years but quit in 2002- no regular screening other than UA with  urology. We have offered AAA sre in past- he has opted out but did have CT chest abdomen pelvis 09/02/23 with no mention of aneurysm 8. STD screening - only active with wife  Status of chronic or acute concerns   # Social update-60 month old grandbaby October 2025. Wife still dealing  with PMR- needing IV and challenging  #renal cell mass 2.2 cm- has repeat MRI upcoming in December 2025- putting off intervention until wife's situation stabilizes  #prior syncope- may have been dehydation related- no recurrent episodes   # Diabetes type I-follows with endocrinology Dr. Mercie  and now on pump S: Medication:recent adjustments as spiking after meals - had previously needed to adjust for lows and then went a little high Lab Results  Component Value Date   HGBA1C 7.7 (A) 11/18/2023   HGBA1C 7.2 (A) 07/16/2023   HGBA1C 7.9 (A) 04/08/2023  A/P: hopefully improving- continue current medications    #Heart failure with reduced ejection fraction- follow up with Dr. Delford 35-40 % EF in 2023- now 40--45% in 2025 S: Medication: Carvedilol  6.25 mg twice daily, Farxiga  10 mg, Entresto  49-51 mg  Edema: none Weight gain: stable Shortness of breath: none A/P: heart failure noted but euvolemic- continue current medications    # CAD -does have ICD and pacemaker in place  # History of SVT-No recurrence #hyperlipidemia S: Medication:Zetia  10 mg, rosuvastatin  10 mg (max tolerable dose with myalgais), clopidogrel  75 mg -no chest pain or shortness of breath  Lab Results  Component Value Date   CHOL 136 07/14/2023   HDL 71 07/14/2023   LDLCALC 50 07/14/2023   LDLDIRECT 85.0 07/11/2017   TRIG 70 07/14/2023   CHOLHDL 1.9 07/14/2023  A/P: CAD asymptomatic continue current medications  - LDL looked great in June- continue current medications   # CKD stage III-GFR typically in the 50s to 60s range.  Avoids NSAIDs. With ospital as high as 2.3 but trended back down to 1.49 with GFR near  47  #hypothyroidism S: compliant On thyroid  medication-levothyroxine  125 mcg  Lab Results  Component Value Date   TSH 1.51 07/14/2023   A/P:hopefully stable- update tsh today. Continue current meds for now    # Erectile dysfunction-Cialis  5 mg daily helpful  -Also helps for BPH -considering PAE with radiology or water ablation with Dr. Carolee  Recommended follow up: No follow-ups on file. Future Appointments  Date Time Provider Department Center  01/07/2024  7:00 AM [facility] HVT DEVICE REMOTES [facility] H&V  03/22/2024  8:20 AM [place], [place], MD [facility] None  07/27/2024  8:40 AM [facility] ANNUAL WELLNESS VISIT 1 [facility] [place]   Lab/Order associations: fasting   ICD-10-CM   1. Preventative health care  Z00.00     2. CAD S/P LAD PCI'03, RCA DES 01/16/15  I25.10    Z98.61     3. Chronic systolic CHF (congestive heart failure) (HCC)  I50.22     4. Type 1 diabetes mellitus with diabetic peripheral angiopathy without gangrene (HCC)  E10.51     5. Screening for prostate cancer  Z12.5     6. Dyslipidemia  E78.5     7. Essential hypertension  I10     8. Hypothyroidism, unspecified type  E03.9       No orders of the defined types were placed in this encounter.   Return precautions advised.  Garnette Lukes, MD

## 2023-11-25 NOTE — Telephone Encounter (Signed)
 CRITICAL VALUE STICKER  CRITICAL VALUE:potassium level 6.0   RECEIVER (on-site recipient of call):John Carrillo  DATE & TIME NOTIFIED: 11/25/2023 3:29pm  MESSENGER (representative from lab):Ninette [facility]  MD NOTIFIED: Dr Katrinka  TIME OF NOTIFICATION:3:29pm  RESPONSE:  provider advise MA to call pt. MA called pt to ask if he had has any muscle weakness, fatigue, diarrhea, muscle cramps pt stated he is not having any. Did advise if he did to go to the ED per provider advise. Provider would like for pt to have redraw labs in the morning for BMP for hyperkalemia. Pt understood well.

## 2023-11-25 NOTE — Patient Instructions (Addendum)
 Please stop by lab before you go If you have mychart- we will send your results within 3 business days of us  receiving them.  If you do not have mychart- we will call you about results within 5 business days of us  receiving them.  *please also note that you will see labs on mychart as soon as they post. I will later go in and write notes on them- will say notes from Dr. Katrinka   Flu shot today high dose  Let us  know when you get COVID shot later  Recommended follow up: Return in about 1 year (around 11/24/2024) for physical or sooner if needed.Schedule b4 you leave.

## 2023-11-26 ENCOUNTER — Other Ambulatory Visit (INDEPENDENT_AMBULATORY_CARE_PROVIDER_SITE_OTHER)

## 2023-11-26 DIAGNOSIS — E875 Hyperkalemia: Secondary | ICD-10-CM

## 2023-11-27 ENCOUNTER — Ambulatory Visit: Payer: Self-pay | Admitting: Family Medicine

## 2023-11-27 LAB — BASIC METABOLIC PANEL WITH GFR
BUN: 19 mg/dL (ref 6–23)
CO2: 30 meq/L (ref 19–32)
Calcium: 8.8 mg/dL (ref 8.4–10.5)
Chloride: 102 meq/L (ref 96–112)
Creatinine, Ser: 1.38 mg/dL (ref 0.40–1.50)
GFR: 51.18 mL/min — ABNORMAL LOW (ref >60.00)
Glucose, Bld: 201 mg/dL — ABNORMAL HIGH (ref 70–99)
Potassium: 4.2 meq/L (ref 3.5–5.1)
Sodium: 139 meq/L (ref 135–145)

## 2023-11-30 ENCOUNTER — Other Ambulatory Visit: Payer: Self-pay | Admitting: Cardiovascular Disease

## 2023-12-02 ENCOUNTER — Other Ambulatory Visit: Payer: Self-pay | Admitting: Cardiovascular Disease

## 2023-12-10 ENCOUNTER — Other Ambulatory Visit: Payer: Self-pay | Admitting: Cardiovascular Disease

## 2023-12-17 ENCOUNTER — Other Ambulatory Visit: Payer: Self-pay | Admitting: Cardiovascular Disease

## 2023-12-24 ENCOUNTER — Telehealth: Payer: Self-pay | Admitting: Pharmacy Technician

## 2023-12-24 ENCOUNTER — Other Ambulatory Visit (HOSPITAL_COMMUNITY): Payer: Self-pay

## 2023-12-24 NOTE — Telephone Encounter (Signed)
 Pharmacy Patient Advocate Encounter   Received notification from Onbase that prior authorization for Dexcom G7 Sensor  is required/requested.   Insurance verification completed.   The patient is insured through [facility].   Per test claim: PA required; PA submitted to above mentioned insurance via Latent Key/confirmation #/EOC [id number] Status is pending

## 2023-12-25 ENCOUNTER — Other Ambulatory Visit (HOSPITAL_COMMUNITY): Payer: Self-pay

## 2023-12-25 NOTE — Telephone Encounter (Signed)
 Pharmacy Patient Advocate Encounter  Received notification from [employer] that Prior Authorization for Dexcom G7 Sensor  has been APPROVED from 12/25/23 to 02/03/25. Ran test claim, Copay is $0.00. This test claim was processed through [facility]- copay amounts may vary at other pharmacies due to pharmacy/plan contracts, or as the patient moves through the different stages of their insurance plan.   PA #/Case ID/Reference #: [id number]

## 2023-12-29 ENCOUNTER — Encounter: Payer: Self-pay | Admitting: Family Medicine

## 2023-12-31 ENCOUNTER — Other Ambulatory Visit: Payer: Self-pay | Admitting: Cardiovascular Disease

## 2024-01-03 ENCOUNTER — Other Ambulatory Visit: Payer: Self-pay | Admitting: Cardiovascular Disease

## 2024-01-07 ENCOUNTER — Other Ambulatory Visit: Payer: Self-pay | Admitting: Cardiovascular Disease

## 2024-01-07 ENCOUNTER — Ambulatory Visit: Payer: Medicare Other

## 2024-01-07 DIAGNOSIS — I472 Ventricular tachycardia, unspecified: Secondary | ICD-10-CM

## 2024-01-08 ENCOUNTER — Encounter: Payer: Self-pay | Admitting: Cardiovascular Disease

## 2024-01-09 LAB — CUP PACEART REMOTE DEVICE CHECK
Battery Remaining Longevity: 15 mo
Battery Voltage: 2.88 V
Brady Statistic AP VP Percent: 67.34 %
Brady Statistic AP VS Percent: 0.36 %
Brady Statistic AS VP Percent: 31.83 %
Brady Statistic AS VS Percent: 0.47 %
Brady Statistic RA Percent Paced: 66.32 %
Brady Statistic RV Percent Paced: 79.92 %
Date Time Interrogation Session: 20251205022827
HighPow Impedance: 63 Ohm
Implantable Lead Connection Status: 753985
Implantable Lead Connection Status: 753985
Implantable Lead Connection Status: 753985
Implantable Lead Implant Date: 20171206
Implantable Lead Implant Date: 20171206
Implantable Lead Implant Date: 20171206
Implantable Lead Location: 753858
Implantable Lead Location: 753859
Implantable Lead Location: 753860
Implantable Lead Model: 4598
Implantable Lead Model: 5076
Implantable Pulse Generator Implant Date: 20171206
Lead Channel Impedance Value: 223.149
Lead Channel Impedance Value: 231.878
Lead Channel Impedance Value: 239.922
Lead Channel Impedance Value: 245.538
Lead Channel Impedance Value: 256.148
Lead Channel Impedance Value: 304 Ohm
Lead Channel Impedance Value: 380 Ohm
Lead Channel Impedance Value: 399 Ohm
Lead Channel Impedance Value: 437 Ohm
Lead Channel Impedance Value: 456 Ohm
Lead Channel Impedance Value: 494 Ohm
Lead Channel Impedance Value: 532 Ohm
Lead Channel Impedance Value: 532 Ohm
Lead Channel Impedance Value: 779 Ohm
Lead Channel Impedance Value: 817 Ohm
Lead Channel Impedance Value: 836 Ohm
Lead Channel Impedance Value: 855 Ohm
Lead Channel Impedance Value: 855 Ohm
Lead Channel Pacing Threshold Amplitude: 0.5 V
Lead Channel Pacing Threshold Amplitude: 1 V
Lead Channel Pacing Threshold Amplitude: 1.375 V
Lead Channel Pacing Threshold Pulse Width: 0.4 ms
Lead Channel Pacing Threshold Pulse Width: 0.4 ms
Lead Channel Pacing Threshold Pulse Width: 0.4 ms
Lead Channel Sensing Intrinsic Amplitude: 13 mV
Lead Channel Sensing Intrinsic Amplitude: 13 mV
Lead Channel Sensing Intrinsic Amplitude: 2.625 mV
Lead Channel Sensing Intrinsic Amplitude: 2.625 mV
Lead Channel Setting Pacing Amplitude: 2 V
Lead Channel Setting Pacing Amplitude: 2 V
Lead Channel Setting Pacing Amplitude: 2.5 V
Lead Channel Setting Pacing Pulse Width: 0.4 ms
Lead Channel Setting Pacing Pulse Width: 0.4 ms
Lead Channel Setting Sensing Sensitivity: 0.3 mV
Zone Setting Status: 755011
Zone Setting Status: 755011

## 2024-01-13 NOTE — Progress Notes (Signed)
 Remote ICD Transmission

## 2024-01-18 ENCOUNTER — Other Ambulatory Visit: Payer: Self-pay | Admitting: Endocrinology

## 2024-01-19 ENCOUNTER — Other Ambulatory Visit: Payer: Self-pay | Admitting: Endocrinology

## 2024-01-19 DIAGNOSIS — E10319 Type 1 diabetes mellitus with unspecified diabetic retinopathy without macular edema: Secondary | ICD-10-CM

## 2024-01-19 MED ORDER — DEXCOM G7 SENSOR MISC: 3 refills | Status: AC

## 2024-01-27 ENCOUNTER — Other Ambulatory Visit: Payer: Self-pay | Admitting: Cardiovascular Disease

## 2024-01-27 LAB — OPHTHALMOLOGY REPORT-SCANNED

## 2024-01-28 ENCOUNTER — Other Ambulatory Visit (HOSPITAL_COMMUNITY): Payer: Self-pay | Admitting: Urology

## 2024-01-28 DIAGNOSIS — D4101 Neoplasm of uncertain behavior of right kidney: Secondary | ICD-10-CM

## 2024-02-02 ENCOUNTER — Other Ambulatory Visit: Payer: Self-pay | Admitting: Cardiovascular Disease

## 2024-02-02 ENCOUNTER — Encounter: Payer: Self-pay | Admitting: Family Medicine

## 2024-02-10 ENCOUNTER — Encounter: Payer: Self-pay | Admitting: Cardiovascular Disease

## 2024-02-10 ENCOUNTER — Ambulatory Visit: Payer: Self-pay | Admitting: Endocrinology

## 2024-02-10 ENCOUNTER — Other Ambulatory Visit: Payer: Self-pay | Admitting: Cardiovascular Disease

## 2024-02-10 NOTE — Progress Notes (Signed)
 JERRELL MANGEL                                          MRN: [id number]   02/10/2024   The VBCI Quality Team Specialist reviewed this patient medical record for the purposes of chart review for care gap closure. The following were reviewed: abstraction for care gap closure-glycemic status assessment.    VBCI Quality Team

## 2024-02-15 ENCOUNTER — Encounter: Payer: Self-pay | Admitting: Cardiovascular Disease

## 2024-02-18 ENCOUNTER — Other Ambulatory Visit: Payer: Self-pay | Admitting: Urology

## 2024-02-18 DIAGNOSIS — D4101 Neoplasm of uncertain behavior of right kidney: Secondary | ICD-10-CM

## 2024-02-20 NOTE — CV Procedure (Signed)
" °  Device system confirmed to be MRI conditional, with implant date > 6 weeks ago, and no evidence of abandoned or epicardial leads in review of most recent CXR  Device last cleared by EP Provider: Prentice Passey 02/20/2024 (cleared 12/2023)  Clearance is good through for 1 year as long as parameters remain stable at time of check. If pt undergoes a cardiac device procedure during that time, they should be re-cleared.   Tachy-therapies to be programmed off if applicable with device back to pre-MRI settings after completion of exam.  [employer] - Programming recommendation received through [employer] App/Tablet  Rocky Catalan, RT  02/20/2024 8:49 AM     "

## 2024-02-24 ENCOUNTER — Ambulatory Visit (HOSPITAL_COMMUNITY)
Admission: RE | Admit: 2024-02-24 | Discharge: 2024-02-24 | Disposition: A | Discharge status: Home / Self Care | Source: Ambulatory Visit | Attending: Urology | Admitting: Urology

## 2024-02-24 ENCOUNTER — Encounter: Payer: Self-pay | Admitting: Cardiovascular Disease

## 2024-02-24 DIAGNOSIS — D4101 Neoplasm of uncertain behavior of right kidney: Secondary | ICD-10-CM | POA: Insufficient documentation

## 2024-02-24 MED ORDER — GADOBUTROL 1 MMOL/ML IV SOLN
7.5000 mL | Freq: Once | INTRAVENOUS | Status: AC | PRN
  Administered 2024-02-24: 7.5 mL via INTRAVENOUS

## 2024-02-24 NOTE — Progress Notes (Signed)
 Patient was monitored by this RN during MRI scan due to presence of a pacemaker. Cardiac rhythm was continuously monitored throughout the procedure. Prior to the start of the scan, the pacemaker was placed in MRI-safe mode by the MRI technician. Following the completion of the scan, the device was returned to its pre-MRI settings. Neurological status and orientation post-procedure were unchanged from baseline.   Pre-procedure Heart Rate (Prior to being placed in MRI safe mode):58-60bpm   Post-procedure Heart Rate (Once pacemaker is returned to baseline mode): 60bpm

## 2024-03-04 ENCOUNTER — Other Ambulatory Visit: Payer: Self-pay | Admitting: Cardiovascular Disease

## 2024-03-05 ENCOUNTER — Other Ambulatory Visit (HOSPITAL_COMMUNITY): Payer: Self-pay | Admitting: Interventional Radiology

## 2024-03-05 ENCOUNTER — Ambulatory Visit
Admission: RE | Admit: 2024-03-05 | Discharge: 2024-03-05 | Disposition: A | Source: Ambulatory Visit | Attending: Urology

## 2024-03-05 ENCOUNTER — Telehealth: Payer: Self-pay | Admitting: Cardiovascular Disease

## 2024-03-05 DIAGNOSIS — D4101 Neoplasm of uncertain behavior of right kidney: Secondary | ICD-10-CM

## 2024-03-05 DIAGNOSIS — N2889 Other specified disorders of kidney and ureter: Secondary | ICD-10-CM

## 2024-03-05 NOTE — Telephone Encounter (Signed)
" ° °  Pre-operative Risk Assessment    Patient Name: John Carrillo  DOB: 05-Jan-1951 MRN: [id number]      Request for Surgical Clearance    Procedure:  Renal Cryoablation Core Biopsy   Date of Surgery:  Clearance 03/17/24                                 Surgeon:  Toribio Faes  Surgeon's Group or Practice Name:  [facility]  Phone number:  [phone] Fax number:  [phone]   Type of Clearance Requested:   - Pharmacy:  Hold Clopidogrel  (Plavix ) 5 days prior    Type of Anesthesia:  General    Additional requests/questions:  Fax to office   Signed, Chantal CHRISTELLA Penton   03/05/2024, 11:27 AM   "

## 2024-03-05 NOTE — Consult Note (Signed)
 "      Chief Complaint: Incidental right renal mass   Referring Physician(s): Bell,Eugene D III  History of Present Illness: John Carrillo is a 74 y.o. male  seen in consultation today for R renal mass at the request of Bell,Eugene D III. He is known to us  from prior PAE, which went well and those symptoms are improved and stable.  More recently,  09/02/23 He had a vagal episode and fall   and CT obtained showed incidental A 2.5 cm lesion in the lateral interpolar right kidney suspicious for a renal neoplasm.  09/30/23 MR confirmed 2.2 cm right renal mass is most consistent with renal cell carcinoma. No evidence of right renal vein involvement or abdominal metastatic disease.   02/24/24 MR showed Redemonstration of heterogeneous enhancing 1.7 x 2.1 cm lesion arising from the right kidney interpolar region, laterally, compatible with renal neoplasm. No evidence of metastatic disease within the abdomen.  He presents for discussion of possible cryoablation.  Past Medical History:  Diagnosis Date   Acute kidney injury superimposed on chronic kidney disease 01/25/2015   AICD (automatic cardioverter/defibrillator) present    CHF (congestive heart failure) (HCC)    CORONARY ARTERY DISEASE 08/11/2006   CVA 12/19/2006   2008   DIABETES MELLITUS, TYPE I 08/11/2006   ECZEMA, HANDS 12/15/2009   History of kidney stones    10+ years ago   HYPERLIPIDEMIA 08/11/2006   HYPERTENSION 08/11/2006   Hypothyroidism    Myocardial infarction [facility])    Presence of permanent cardiac pacemaker    Type 2 diabetes mellitus without complications (HCC) 12/20/2016    Past Surgical History:  Procedure Laterality Date   CARDIAC CATHETERIZATION N/A 01/16/2015   Procedure: Left Heart Cath and Coronary Angiography;  Surgeon: Dorn JINNY Lesches, MD;  Location: [facility] INVASIVE CV LAB;  Service: Cardiovascular;  Laterality: N/A;   CORONARY STENT PLACEMENT     EP IMPLANTABLE DEVICE N/A 01/10/2016   MDT Claria MRI Quad CRTD  implanted by Dr Kelsie   HERNIA REPAIR     INGUINAL HERNIA REPAIR Left 05/22/2020   Procedure: LEFT INGUINAL HERNIA REPAIR WITH MESH;  Surgeon: Vernetta Berg, MD;  Location: [facility] OR;  Service: General;  Laterality: Left;   INSERT / REPLACE / REMOVE PACEMAKER     INSERTION OF MESH Left 05/22/2020   Procedure: INSERTION OF MESH;  Surgeon: Vernetta Berg, MD;  Location: [facility] OR;  Service: General;  Laterality: Left;   IR ANGIOGRAM PELVIS SELECTIVE OR SUPRASELECTIVE  08/28/2022   IR ANGIOGRAM SELECTIVE EACH ADDITIONAL VESSEL  08/28/2022   IR ANGIOGRAM SELECTIVE EACH ADDITIONAL VESSEL  08/28/2022   IR ANGIOGRAM SELECTIVE EACH ADDITIONAL VESSEL  08/28/2022   IR EMBO TUMOR ORGAN ISCHEMIA INFARCT INC GUIDE ROADMAPPING  08/28/2022   IR US  GUIDE VASC ACCESS LEFT  08/28/2022   IR US  GUIDE VASC ACCESS LEFT  08/28/2022   IR US  GUIDE VASC ACCESS LEFT  08/28/2022   IR US  GUIDE VASC ACCESS RIGHT  08/28/2022   TEE WITHOUT CARDIOVERSION N/A 01/26/2015   Procedure: TRANSESOPHAGEAL ECHOCARDIOGRAM (TEE);  Surgeon: Jerel Balding, MD;  Location: [facility] ENDOSCOPY;  Service: Cardiovascular;  Laterality: N/A;   TONSILECTOMY, ADENOIDECTOMY, BILATERAL MYRINGOTOMY AND TUBES     TONSILLECTOMY     WISDOM TOOTH EXTRACTION     WRIST FRACTURE SURGERY Right 1960   3rd grade    Allergies: Patient has no known allergies.  Medications: Prior to Admission medications  Medication Sig Start Date End Date Taking? Authorizing Provider  Blood  Glucose Monitoring Suppl (ONETOUCH VERIO) w/Device KIT 1 Device by Does not apply route daily. 10/02/20   Kassie Mallick, MD  carvedilol  (COREG ) 6.25 MG tablet TAKE 1 TABLET(6.25 MG) BY MOUTH TWICE DAILY 09/29/23   Lesia Ozell Barter, PA-C  CINNAMON PO Take 1 tablet by mouth daily.    [provider]  clopidogrel  (PLAVIX ) 75 MG tablet TAKE 1 TABLET(75 MG) BY MOUTH DAILY 12/11/23   Nishan, Peter C, MD  Coenzyme Q10 (COQ10) 200 MG CAPS Take 200 mg by mouth in the morning.     [provider]  Continuous Glucose Sensor (DEXCOM G7 SENSOR) MISC USE FOR CONTINUOUS BLOOD GLUCOSE MONITORING. REPLACE EVERY 10 DAYS 01/19/24   Thapa, Sudan, MD  dapagliflozin  propanediol (FARXIGA ) 10 MG TABS tablet TAKE 1 TABLET(10 MG) BY MOUTH DAILY BEFORE BREAKFAST 09/18/23   Thapa, Sudan, MD  ezetimibe  (ZETIA ) 10 MG tablet TAKE 1 TABLET(10 MG) BY MOUTH DAILY. 08/04/23   Katrinka Garnette KIDD, MD  GARLIC PO Take 1 tablet by mouth daily.    [provider]  Glucagon  (GVOKE HYPOPEN  2-PACK) 0.5 MG/0.1ML SOAJ Use for severe lows 05/15/22   Von Pacific, MD  glucose blood (ONETOUCH VERIO) test strip 1 each by Other route 2 (two) times daily. And lancets 2/day 10/25/16   Kassie Mallick, MD  HUMALOG  [facility] 100 UNIT/ML KwikPen INJECT UNDER THE SKIN 7 UNITS EVERY MORNING, 3 UNITS AT LUNCH, AND 7 UNITS AT DINNER. 06/30/22   Von Pacific, MD  Insulin  Infusion Pump Supplies (TANDEM MOBI AUTOSOFT XC KIT) MISC  04/10/23   [provider]  insulin  lispro (HUMALOG ) 100 UNIT/ML injection INJECT 60 UNITS VIA PUMP DAILY 10/03/23   Thapa, Sudan, MD  Insulin  Lispro Prot & Lispro (HUMALOG  MIX 50/50 KWIKPEN) (50-50) 100 UNIT/ML Kwikpen Inject under the skin 7 units in every morning , 3 units at lunch and 7 units at dinner 09/11/22   Von Pacific, MD  Insulin  Pen Needle (PEN NEEDLES) 32G X 4 MM MISC Use to inject insulin  4 times a day 02/21/22   Von Pacific, MD  levothyroxine  (SYNTHROID ) 125 MCG tablet Take 1 tablet (125 mcg total) by mouth daily. 04/28/23   Thapa, Sudan, MD  Probiotic Product (PROBIOTIC PO) Take 1 capsule by mouth every morning.    [provider]  rosuvastatin  (CRESTOR ) 10 MG tablet TAKE 1 TABLET(10 MG) BY MOUTH DAILY. MAKE APPOINTMENT FOR FURTHER REFILLS. 2 ND ATTEMPT 02/11/24   Nishan, Peter C, MD  sacubitril -valsartan  (ENTRESTO ) 49-51 MG TAKE 1 TABLET BY MOUTH TWICE DAILY. MAKE APPOINTMENT WITH DOCTOR NISHAN FOR FURTHER REFILLS. [phone] FINAL ATTEMPT 03/04/24   Delford Maude BROCKS, MD   tadalafil  (CIALIS ) 5 MG tablet TAKE 1 TABLET(5 MG) BY MOUTH DAILY 05/13/23   Katrinka Garnette KIDD, MD     Family History  Problem Relation Age of Onset   CAD Father    Other Mother        passed right before 22- tumor on kidney recent diagnosis   Dementia Mother        progressing rapidly   CAD Paternal Grandfather        48s   CAD Paternal Grandmother        77s   Brain cancer Maternal Grandmother    Lymphoma Sister        b cell   Celiac disease Sister    Diabetes Neg Hx     Social History   Socioeconomic History   Marital status: Married    Spouse name: Not  on file   Number of children: Not on file   Years of education: Not on file   Highest education level: Master's degree (e.g., MA, MS, MEng, MEd, MSW, MBA)  Occupational History   Occupation: Engineer, Maintenance: [employer]  Tobacco Use   Smoking status: Former    Current packs/day: 0.00    Average packs/day: 1 pack/day for 30.0 years (30.0 ttl pk-yrs)    Types: Cigarettes    Start date: 05/04/1970    Quit date: 05/03/2000    Years since quitting: 23.8   Smokeless tobacco: Never  Vaping Use   Vaping status: Never Used  Substance and Sexual Activity   Alcohol use: Yes    Alcohol/week: 2.0 standard drinks of alcohol    Types: 1 Glasses of wine, 1 Shots of liquor per week    Comment: 1-2/month   Drug use: No   Sexual activity: Yes    Birth control/protection: I.U.D.  Other Topics Concern   Not on file  Social History Narrative   Married (wife seen elsewhere- still working in catering manager 2024). 3 step children, 1 biological.  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      Hobbies: race sail boats, adrenaline related activities   Social Drivers of Health   Tobacco Use: Medium Risk (11/25/2023)   Patient History    Smoking Tobacco Use: Former    Smokeless Tobacco Use: Never    Passive Exposure: Not on Programmer, Applications Strain: Low Risk (11/24/2023)   Overall Financial Resource Strain (CARDIA)    Difficulty of Paying Living Expenses: Not hard at all  Food Insecurity: No Food Insecurity (11/24/2023)   Epic    Worried About Programme Researcher, Broadcasting/film/video in the Last Year: Never true    Ran Out of Food in the Last Year: Never true  Transportation Needs: No Transportation Needs (11/24/2023)   Epic    Lack of Transportation (Medical): No    Lack of Transportation (Non-Medical): No  Physical Activity: Sufficiently Active (11/24/2023)   Exercise Vital Sign    Days of Exercise per Week: 5 days    Minutes of Exercise per Session: 60 min  Stress: No Stress Concern Present (11/24/2023)   [employer] of Occupational Health - Occupational Stress Questionnaire    Feeling of Stress: Not at all  Social Connections: Socially Integrated (11/24/2023)   Social Connection and Isolation Panel    Frequency of Communication with Friends and Family: Three times a week    Frequency of Social Gatherings with Friends and Family: Twice a week    Attends Religious Services: 1 to 4 times per year    Active Member of Clubs or Organizations: Yes    Attends Banker Meetings: More than 4 times per year    Marital Status: Married  Depression (PHQ2-9): Low Risk (11/25/2023)   Depression (PHQ2-9)    PHQ-2 Score: 0  Alcohol Screen: Low Risk (11/24/2023)   Alcohol Screen    Last Alcohol Screening Score (AUDIT): 1  Housing: Low Risk (11/24/2023)   Epic    Unable to Pay for Housing in the Last Year: No    Number of Times Moved in the Last Year: 0    Homeless in the Last Year: No  Utilities: Not At Risk (07/23/2023)   Epic    Threatened with loss of utilities: No  Health Literacy: Adequate Health  Literacy (07/23/2023)   B1300 Health Literacy    Frequency of need for help with medical instructions: Never    ECOG Status: 1 - Symptomatic but completely ambulatory  Review of Systems: A 12 point ROS  discussed and pertinent positives are indicated in the HPI above.  All other systems are negative.  Review of Systems  Vital Signs: BP 132/79 (BP Location: Left Arm, Patient Position: Sitting, Cuff Size: Normal)   Pulse 60   Temp (!) 97.4 F (36.3 C) (Oral)   Resp 18   SpO2 95%     Physical Exam Constitutional: Oriented to person, place, and time. Well-developed and well-nourished. No distress.   HENT:  Head: Normocephalic and atraumatic.  Eyes: Conjunctivae and EOM are normal. Right eye exhibits no discharge. Left eye exhibits no discharge. No scleral icterus.  Neck: No JVD present.  Pulmonary/Chest: Effort normal. No stridor. No respiratory distress.  Abdomen: soft, non distended Neurological:  alert and oriented to person, place, and time.  Skin: Skin is warm and dry.  not diaphoretic.  Psychiatric:   normal mood and affect.   behavior is normal. Judgment and thought content normal.       Imaging: MR ABDOMEN WWO CONTRAST Result Date: 02/24/2024 CLINICAL DATA:  Neoplasm of uncertain behavior of right kidney. EXAM: MRI ABDOMEN WITHOUT AND WITH CONTRAST TECHNIQUE: Multiplanar multisequence MR imaging of the abdomen was performed both before and after the administration of intravenous contrast. CONTRAST:  7.5mL GADAVIST  GADOBUTROL  1 MMOL/ML IV SOLN COMPARISON:  MRI abdomen from 09/30/2023. FINDINGS: Lower chest: Unremarkable MR appearance to the lung bases. No pleural effusion. No pericardial effusion. Normal heart size. Hepatobiliary: The liver is normal in size. Noncirrhotic configuration. No focal liver lesion. No intrahepatic or extrahepatic bile duct dilatation. No choledocholithiasis. Unremarkable gallbladder. Pancreas: Normal T1 hyperintense signal intensity of the pancreas. Redemonstration of 5 x 9 mm T2 hyperintense lesion in the pancreatic head. No direct communication of the lesion identified within the pancreatic side branch or main duct however, this is unchanged since the  prior study and favored to represent pancreatic side-branch IPMN. No mural nodule or internal enhancement on the postcontrast images. There is an additional 3 mm cystic focus in the pancreatic tail (series 10, image 19), which is also unchanged since the prior study. No other focal pancreatic lesion. No peripancreatic fat stranding. Main pancreatic duct is not dilated. Spleen:  Within normal limits in size and appearance. No focal mass. Adrenals/Urinary Tract: Unremarkable adrenal glands. No hydroureteronephrosis. Redemonstration of partially exophytic heterogeneous 1.7 x 2.1 cm lesion arising from the right kidney interpolar region, laterally. The lesion exhibits heterogeneous enhancement and is compatible with renal neoplasm. Right renal vein is patent. No perirenal lymphadenopathy. No suspicious mass in the left kidney. There is a simple cyst arising from the right kidney interpolar region, anteriorly measuring up to 3.2 x 3.3 cm. Stomach/Bowel: Visualized portions within the abdomen are unremarkable. No disproportionate dilation of bowel loops. There is tiny sliding hiatal hernia. Moderate stool burden noted. Vascular/Lymphatic: No pathologically enlarged lymph nodes identified. No abdominal aortic aneurysm demonstrated. No ascites. Other:  None. Musculoskeletal: No suspicious bone lesions identified. IMPRESSION: 1. Redemonstration of heterogeneous enhancing 1.7 x 2.1 cm lesion arising from the right kidney interpolar region, laterally, compatible with renal neoplasm. No evidence of metastatic disease within the abdomen. 2. Redemonstration of subcentimeter sized cystic lesions in the pancreas, as described above, favored to represent pancreatic side-branch IPMN. Follow-up examination with MRI abdomen with MRCP protocol is recommended in 2  years to document stability. 3. Multiple other nonacute observations, as described above. Electronically Signed   By: Ree Molt M.D.   On: 02/24/2024 12:18     Labs:  CBC: Recent Labs    09/02/23 1658 09/02/23 1704 09/03/23 0539 09/15/23 1215 11/25/23 0836  WBC 11.6*  --  5.7 6.7 8.2  HGB 15.6 16.3 15.1 15.4 16.3  HCT 46.3 48.0 45.0 45.9 49.2  PLT 160  --  142* 170.0 165.0    COAGS: Recent Labs    09/02/23 1658  INR 0.9    BMP: Recent Labs    09/02/23 1658 09/02/23 1704 09/03/23 0539 09/15/23 1215 11/25/23 0836 11/26/23 1610  NA 138   < > 140 140 146* 139  K 4.1   < > 3.6 4.3 6.0 No hemolysis seen* 4.2  CL 103   < > 109 105 106 102  CO2 23  --  25 29 30 30   GLUCOSE 248*   < > 143* 128* 159* 201*  BUN 21   < > 20 23 23 19   CALCIUM  8.6*  --  8.4* 8.8 9.7 8.8  CREATININE 2.19*   < > 1.81* 1.49 1.50 1.38  GFRNONAA 31*  --  39*  --   --   --    < > = values in this interval not displayed.    LIVER FUNCTION TESTS: Recent Labs    09/02/23 1658 09/03/23 0539 09/15/23 1215 11/25/23 0836  BILITOT 1.4* 0.9 0.5 0.7  AST 30 22 22 26   ALT 23 23 22 27   ALKPHOS 48 49 50 52  PROT 6.2* 5.8* 6.4 6.8  ALBUMIN 3.5 3.1* 3.8 4.3    TUMOR MARKERS: No results for input(s): AFPTM, CEA, CA199, CHROMGRNA in the last 8760 hours.  Assessment and Plan: My impression is that this patient has a 2.1cm peripheral right mid pole renal mass concerning for   renal cell carcinoma.   We discussed this in detail and in regards to the spectrum of renal masses which includes cysts (pure cysts are considered benign), solid masses and everything in between. The risk of metastasis increases as the size of solid renal mass increases. In general, it is believed that the risk of metastasis for renal masses less than 3-4 cm is small (up to approximately 5%) based mainly on large retrospective studies.  In some cases and especially in patients of older age and multiple comorbidities a surveillance approach may be appropriate.   Active surveillance could also be considered given that this lesion is less than 3 cm to help further assess growth  rate, etc. The treatment of solid renal masses includes:  cryoablation (percutaneous and laparoscopic) in addition to partial and complete nephrectomy (each with option of laparoscopic, robotic and open depending on appropriateness). Furthermore, nephrectomy appears to be an independent risk factor for the development of chronic kidney disease suggesting that nephron sparing approaches should be implored whenever feasible. We reviewed these options in context of the patients current situation as well as the pros and cons of each.  We had   discussion today about the risk and benefits of each. We discussed in particular the percutaneous cryoablation procedure with CT guidance under anesthesia, anticipated benefits, possible risks and complications, expected postoperative course, likelihood of concurrent percutaneous core biopsy to get a pathologic specimen, and need for continued imaging follow-up assuming this is demonstrated to be carcinoma.  PT and spouse  seemed to understand and did ask appropriate questions. He  had had  a good discussion with Dr. Carolee  already about this. He  is motivated to proceed. Accordingly, we can set him  up for CT-guided left renal mass cryoablation and core biopsy under anesthesia at [facility]  at his.  Thank you for this interesting consult.  I greatly enjoyed meeting VIRAL SCHRAMM and look forward to participating in their care.  A copy of this report was sent to the requesting provider on this date.  Electronically Signed: Dayne Ashantee Deupree 03/05/2024, 8:54 AM   I spent a total of    25 Minutes in face to face in clinical consultation, greater than 50% of which was counseling/coordinating care for right renal mass.   "

## 2024-03-05 NOTE — Telephone Encounter (Signed)
 S/w the pt and he has agreed to in office appt 03/08/24 with Lum Louis, NP for preop clearance. Pt does not want to wait until 03/26/24 when he see's Dr. Delford and Jodie tiller, PAC.   Pt will keep his appt with Dr. Nishan and Lorna Passey, [facility] as planned.

## 2024-03-05 NOTE — Telephone Encounter (Signed)
" ° ° °  Primary Cardiologist:Peter Delford, MD  Chart reviewed as part of pre-operative protocol coverage. Because of John Carrillo's past medical history and time since last visit, he/she will require a follow-up visit in order to better assess preoperative cardiovascular risk.  Pre-op covering staff: - Please schedule office appointment and call patient to inform them. - Please contact requesting surgeon's office via preferred method (i.e, phone, fax) to inform them of need for appointment prior to surgery.  If applicable, this message will also be routed to pharmacy pool and/or primary cardiologist for input on holding anticoagulant/antiplatelet agent as requested below so that this information is available at time of patient's appointment.   Josefa CHRISTELLA Beauvais, NP  03/05/2024, 11:51 AM   "

## 2024-03-08 ENCOUNTER — Ambulatory Visit: Admitting: Emergency Medicine

## 2024-03-08 ENCOUNTER — Encounter: Payer: Self-pay | Admitting: Emergency Medicine

## 2024-03-08 VITALS — BP 90/68 | HR 59 | Ht 69.0 in | Wt 180.0 lb

## 2024-03-08 DIAGNOSIS — Z01810 Encounter for preprocedural cardiovascular examination: Secondary | ICD-10-CM | POA: Diagnosis not present

## 2024-03-08 DIAGNOSIS — I255 Ischemic cardiomyopathy: Secondary | ICD-10-CM

## 2024-03-08 MED ORDER — CARVEDILOL 3.125 MG PO TABS: 3.1250 mg | ORAL_TABLET | Freq: Two times a day (BID) | ORAL | 0 refills | Status: AC

## 2024-03-08 MED ORDER — SACUBITRIL-VALSARTAN 49-51 MG PO TABS: 1.0000 | ORAL_TABLET | Freq: Two times a day (BID) | ORAL | 0 refills | Status: AC

## 2024-03-08 NOTE — Patient Instructions (Addendum)
 Medication Instructions:  DECREASE Carvedilol  3.125 mg Twice daily *If you need a refill on your cardiac medications before your next appointment, please call your pharmacy*  Lab Work: NONE ORDERED  Testing/Procedures: NONE ORDERED  Follow-Up: At [facility], you and your health needs are our priority.  As part of our continuing mission to provide you with exceptional heart care, our providers are all part of one team.  This team includes your primary Cardiologist (physician) and Advanced Practice Providers or APPs (Physician Assistants and Nurse Practitioners) who all work together to provide you with the care you need, when you need it.  Your next appointment:    Keep follow up appointment with Dr. Delford   We recommend signing up for the patient portal called MyChart.  Sign up information is provided on this After Visit Summary.  MyChart is used to connect with patients for Virtual Visits (Telemedicine).  Patients are able to view lab/test results, encounter notes, upcoming appointments, etc.  Non-urgent messages can be sent to your provider as well.   To learn more about what you can do with MyChart, go to [email or web address].

## 2024-03-08 NOTE — Progress Notes (Signed)
 "     Evaluation Performed:  Preoperative cardiovascular risk assessment _____________   Date:  03/08/2024   Patient ID:  John Carrillo, DOB 1950-04-03, MRN [id number] Patient Location:  Office Provider location:   Office  Primary Care Provider:  Katrinka Garnette KIDD, MD Primary Cardiologist:  Maude Emmer, MD  Chief Complaint / Patient Profile   74 y.o. y/o male with a h/o CAD status post anterior MI 2003 status post tPA and rescue LAD PCI at [facility], ICM, LBBB, CVA right occipital lobe 2016, HL, DM2, V. tach, CRT-D (2017) who is pending Renal Cryoablation Core Biopsy on 03/17/2024 and presents today for preoperative cardiovascular risk assessment.  History of Present Illness    John Carrillo is a 74 y.o. male who presents today with the above PMH.   LHC 01/2015: Patent LAD stent, high-grade mid-distal RCA stenosis was successfully stented.  Pt was last seen in cardiology clinic on 11/11/2022 by Dr. Emmer.  At that time DENG KEMLER was doing well.  Most recent device check on 01/09/2024 demonstrating AP/BiV paced, no arrhythmias.  Echo 09/03/2023: Thin and dyskinetic apex, consistent with prior LAD infarct, LVEF 40-45%, mildly decreased LV function, RWMA present (see echo), G1 DD, RV mildly enlarged, LA mildly dilated, no significant valvular abnormalities.  The echo above was performed during an admission for an episode of syncope that was believed to have been related to a vagal episode requiring admission, where they found the renal lesion that was suspicious for neoplasm.  The patient is now pending procedure as outlined above.  He presents independently, appears stable from a cardiovascular standpoint.  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  He tries to stay away from heavy lifting or other activities that may elicit a vagal response/lightheadedness, which has only happened twice over the last 8  months, resolves with laying down.  He reports staying hydrated helps to prevent symptoms. Denies chest pain, SOB, orthopnea, PND, dizziness, near-syncope dark/tarry/bloody stools, hematuria, weight gain, edema.  Patient is mildly hypotensive on arrival, 90/68, asymptomatic.  Past Medical History    Past Medical History:  Diagnosis Date   Acute kidney injury superimposed on chronic kidney disease 01/25/2015   AICD (automatic cardioverter/defibrillator) present    CHF (congestive heart failure) (HCC)    CORONARY ARTERY DISEASE 08/11/2006   CVA 12/19/2006   2008   DIABETES MELLITUS, TYPE I 08/11/2006   ECZEMA, HANDS 12/15/2009   History of kidney stones    10+ years ago   HYPERLIPIDEMIA 08/11/2006   HYPERTENSION 08/11/2006   Hypothyroidism    Myocardial infarction [facility])    Presence of permanent cardiac pacemaker    Type 2 diabetes mellitus without complications (HCC) 12/20/2016   Past Surgical History:  Procedure Laterality Date   CARDIAC CATHETERIZATION N/A 01/16/2015   Procedure: Left Heart Cath and Coronary Angiography;  Surgeon: Dorn JINNY Lesches, MD;  Location: [facility] INVASIVE CV LAB;  Service: Cardiovascular;  Laterality: N/A;   CORONARY STENT PLACEMENT     EP IMPLANTABLE DEVICE N/A 01/10/2016   MDT [place] MRI Quad CRTD implanted by Dr Kelsie   HERNIA REPAIR     INGUINAL HERNIA REPAIR Left 05/22/2020   Procedure: LEFT INGUINAL HERNIA REPAIR WITH MESH;  Surgeon: Vernetta Berg, MD;  Location: [facility] OR;  Service: General;  Laterality: Left;   INSERT / REPLACE / REMOVE PACEMAKER     INSERTION OF MESH Left  05/22/2020   Procedure: INSERTION OF MESH;  Surgeon: Vernetta Berg, MD;  Location: [facility] OR;  Service: General;  Laterality: Left;   IR ANGIOGRAM PELVIS SELECTIVE OR SUPRASELECTIVE  08/28/2022   IR ANGIOGRAM SELECTIVE EACH ADDITIONAL VESSEL  08/28/2022   IR ANGIOGRAM SELECTIVE EACH ADDITIONAL VESSEL  08/28/2022   IR ANGIOGRAM SELECTIVE EACH ADDITIONAL VESSEL  08/28/2022   IR EMBO TUMOR  ORGAN ISCHEMIA INFARCT INC GUIDE ROADMAPPING  08/28/2022   IR US  GUIDE VASC ACCESS LEFT  08/28/2022   IR US  GUIDE VASC ACCESS LEFT  08/28/2022   IR US  GUIDE VASC ACCESS LEFT  08/28/2022   IR US  GUIDE VASC ACCESS RIGHT  08/28/2022   TEE WITHOUT CARDIOVERSION N/A 01/26/2015   Procedure: TRANSESOPHAGEAL ECHOCARDIOGRAM (TEE);  Surgeon: Jerel Balding, MD;  Location: [facility] ENDOSCOPY;  Service: Cardiovascular;  Laterality: N/A;   TONSILECTOMY, ADENOIDECTOMY, BILATERAL MYRINGOTOMY AND TUBES     TONSILLECTOMY     WISDOM TOOTH EXTRACTION     WRIST FRACTURE SURGERY Right 1960   3rd grade    Allergies  Allergies[1]  Home Medications    Prior to Admission medications  Medication Sig Start Date End Date Taking? Authorizing Provider  Blood Glucose Monitoring Suppl (ONETOUCH VERIO) w/Device KIT 1 Device by Does not apply route daily. 10/02/20   Kassie Mallick, MD  carvedilol  (COREG ) 6.25 MG tablet TAKE 1 TABLET(6.25 MG) BY MOUTH TWICE DAILY 09/29/23   Lesia Ozell Barter, PA-C  CINNAMON PO Take 1 tablet by mouth daily.    [provider]  clopidogrel  (PLAVIX ) 75 MG tablet TAKE 1 TABLET(75 MG) BY MOUTH DAILY 12/11/23   Nishan, Peter C, MD  Coenzyme Q10 (COQ10) 200 MG CAPS Take 200 mg by mouth in the morning.    [provider]  Continuous Glucose Sensor (DEXCOM G7 SENSOR) MISC USE FOR CONTINUOUS BLOOD GLUCOSE MONITORING. REPLACE EVERY 10 DAYS 01/19/24   Thapa, Sudan, MD  dapagliflozin  propanediol (FARXIGA ) 10 MG TABS tablet TAKE 1 TABLET(10 MG) BY MOUTH DAILY BEFORE BREAKFAST 09/18/23   Thapa, Sudan, MD  ezetimibe  (ZETIA ) 10 MG tablet TAKE 1 TABLET(10 MG) BY MOUTH DAILY. 08/04/23   Katrinka Garnette KIDD, MD  GARLIC PO Take 1 tablet by mouth daily.    [provider]  Glucagon  (GVOKE HYPOPEN  2-PACK) 0.5 MG/0.1ML SOAJ Use for severe lows 05/15/22   Von [place], MD  glucose blood (ONETOUCH VERIO) test strip 1 each by Other route 2 (two) times daily. And lancets 2/day 10/25/16   Kassie Mallick, MD  HUMALOG  KWIKPEN 100 UNIT/ML KwikPen INJECT UNDER THE SKIN 7 UNITS EVERY MORNING, 3 UNITS AT LUNCH, AND 7 UNITS AT DINNER. 06/30/22   Von [place], MD  Insulin  Infusion Pump Supplies (TANDEM MOBI AUTOSOFT XC KIT) MISC  04/10/23   [provider]  insulin  lispro (HUMALOG ) 100 UNIT/ML injection INJECT 60 UNITS VIA PUMP DAILY 10/03/23   Thapa, Sudan, MD  Insulin  Lispro Prot & Lispro (HUMALOG  MIX 50/50 KWIKPEN) (50-50) 100 UNIT/ML Kwikpen Inject under the skin 7 units in every morning , 3 units at lunch and 7 units at dinner 09/11/22   Von [place], MD  Insulin  Pen Needle (PEN NEEDLES) 32G X 4 MM MISC Use to inject insulin  4 times a day 02/21/22   Von [place], MD  levothyroxine  (SYNTHROID ) 125 MCG tablet Take 1 tablet (125 mcg total) by mouth daily. 04/28/23   Thapa, Sudan, MD  Probiotic Product (PROBIOTIC PO) Take 1 capsule by mouth every morning.    [provider]  rosuvastatin  (  CRESTOR ) 10 MG tablet TAKE 1 TABLET(10 MG) BY MOUTH DAILY. MAKE APPOINTMENT FOR FURTHER REFILLS. 2 ND ATTEMPT 02/11/24   Nishan, Peter C, MD  sacubitril -valsartan  (ENTRESTO ) 49-51 MG TAKE 1 TABLET BY MOUTH TWICE DAILY. MAKE APPOINTMENT WITH DOCTOR NISHAN FOR FURTHER REFILLS. [phone] FINAL ATTEMPT 03/04/24   Delford Maude BROCKS, MD  tadalafil  (CIALIS ) 5 MG tablet TAKE 1 TABLET(5 MG) BY MOUTH DAILY 05/13/23   Katrinka Garnette KIDD, MD    Physical Exam    GEN: Well-nourished, well-developed, no acute distress HEENT: No abnormalities Cardiac: S1 and S2 clear, RRR, no murmurs, rubs Pulmonary: Clear to auscultation, no wheezes, rhonchi, rales Musculoskeletal: No edema, no deformity Skin: Warm and dry Neuro: A&O X3 Psych: Normal affect   Assessment & Plan    1.  Preoperative Cardiovascular Risk Assessment: According to the Revised Cardiac Risk Index (RCRI), his Perioperative Risk of Major Cardiac Event is (%): 11 His Functional Capacity in METs is: 6.61 according to the Duke Activity Status Index  (DASI). Therefore, based on ACC/AHA guidelines, patient would be at high, but acceptable risk for the planned procedure without further cardiovascular testing.   The patient was advised that if he develops new symptoms prior to surgery to contact our office to arrange for a follow-up visit, and he verbalized understanding.  It is okay to hold clopidogrel  (Plavix ) for 5 days prior to surgery. I recommend taking aspirin  81 mg daily while off of clopidogrel .  The patient should discuss this with his surgeon to see if it is acceptable from a bleeding perspective.  If the surgeon feels the bleeding risk is too great, he should just stop clopidogrel  5 days prior. Postoperatively, he can stop aspirin  and resume clopidogrel  when cleared by the surgeon.  2.  Ischemic cardiomyopathy Echo 09/03/2023: Thin and dyskinetic apex, consistent with prior LAD infarct, LVEF 40-45%, mildly decreased LV function, RWMA present (see echo), G1 DD, RV mildly enlarged, LA mildly dilated, no significant valvular abnormalities. Doing well, asymptomatic.  Exercises 3-5 days a week without symptoms.  Weight stable Rare episodes of lightheadedness that improved with lying flat, suspect secondary to vasovagal response/hypotension, has not occurred in months. Will decrease carvedilol  to 3.125 mg twice daily to see if BP improves to help prevent further episodes. Will advise patient to keep BP log until next visit in 3 weeks with Dr. Delford. Continue Entresto  49-51 mg twice daily  A copy of this note will be routed to requesting surgeon.  Patient requested to keep follow-up with Dr. Nishan in 3 weeks.  Proceed to ED with any new or worsening symptoms.   Kaisei Gilbo E Zakry Caso, NP  03/08/2024, 5:12 PM     [1] No Known Allergies  "

## 2024-03-09 ENCOUNTER — Other Ambulatory Visit: Payer: Self-pay | Admitting: Cardiovascular Disease

## 2024-03-12 NOTE — Progress Notes (Unsigned)
 CARDIOLOGY CONSULT NOTE       Patient ID: John Carrillo MRN: [id number] DOB/AGE: [birth year] 73 y.o.  Primary Physician: Katrinka Garnette KIDD, MD Primary Cardiologist: Delford Reason for F/U:  Ischemic DCM/AICD     HPI:   John Carrillo is a 74 y.o. male with a hx of CAD status post anterior MI 20 years ago treated with stenting of the LAD, ICM, LBBB, prior CVA, HL, DM 2.  He was evaluated by Dr. Elspeth Sage in 2008 for ICD implantation for primary prevention.  He declined at that time.  Had SEMI 2016 with RCA intervention EF 15% Readmitted 01/23/17 with right occipital lobe CVA placed on Brillinta and Eliquis . Eventually agreed to MDT CRTD device implant 2017   SEen by [facility] EP 09/02/22 and having some tachycardia ? 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 Also has a bipolar son living independantly  In [place]   Had IR prostate embolization for hesitancy despite flomax Rx  Scheduled for renal core biopsy and cryoablation on 03/17/24 Had a 1.7 x 2.1 cm lesion in right kidney interpolar regions  ***    ROS All other systems reviewed and negative except as noted above  Past Medical History:  Diagnosis Date   Acute kidney injury superimposed on chronic kidney disease 01/25/2015   AICD (automatic cardioverter/defibrillator) present    CHF (congestive heart failure) (HCC)    CORONARY ARTERY DISEASE 08/11/2006   CVA 12/19/2006   2008   DIABETES MELLITUS, TYPE I 08/11/2006   ECZEMA, HANDS 12/15/2009   History of kidney stones    10+ years ago   HYPERLIPIDEMIA 08/11/2006    HYPERTENSION 08/11/2006   Hypothyroidism    Myocardial infarction [facility])    Presence of permanent cardiac pacemaker    Type 2 diabetes mellitus without complications (HCC) 12/20/2016    Family History  Problem Relation Age of Onset   CAD Father    Other Mother        passed right before 63- tumor on kidney recent diagnosis   Dementia Mother        progressing rapidly   CAD Paternal Grandfather        76s   CAD Paternal Grandmother        66s   Brain cancer Maternal Grandmother    Lymphoma Sister        b cell   Celiac disease Sister    Diabetes Neg Hx     Social History   Socioeconomic History   Marital status: Married    Spouse name: Not on file   Number of children: Not on file   Years of education: Not on file   Highest education level: Master's degree (e.g., MA, MS, MEng, MEd, MSW, MBA)  Occupational History   Occupation: Engineer, Maintenance: [employer]  Tobacco Use   Smoking status: Former    Current packs/day: 0.00    Average packs/day: 1 pack/day for 30.0 years (30.0 ttl pk-yrs)    Types: Cigarettes    Start date: 05/04/1970  Quit date: 05/03/2000    Years since quitting: 23.8   Smokeless tobacco: Never  Vaping Use   Vaping status: Never Used  Substance and Sexual Activity   Alcohol use: Yes    Alcohol/week: 2.0 standard drinks of alcohol    Types: 1 Glasses of wine, 1 Shots of liquor per week    Comment: 1-2/month   Drug use: No   Sexual activity: Yes    Birth control/protection: I.U.D.  Other Topics Concern   Not on file  Social History Narrative   Married (wife seen elsewhere- still working in catering manager 2024). 3 step children, 1 biological.  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      Hobbies: race sail boats, adrenaline related activities   Social Drivers of Health   Tobacco Use: Medium Risk (03/08/2024)   Patient History    Smoking Tobacco  Use: Former    Smokeless Tobacco Use: Never    Passive Exposure: Not on Actuary Strain: Low Risk (11/24/2023)   Overall Financial Resource Strain (CARDIA)    Difficulty of Paying Living Expenses: Not hard at all  Food Insecurity: No Food Insecurity (11/24/2023)   Epic    Worried About Programme Researcher, Broadcasting/film/video in the Last Year: Never true    Ran Out of Food in the Last Year: Never true  Transportation Needs: No Transportation Needs (11/24/2023)   Epic    Lack of Transportation (Medical): No    Lack of Transportation (Non-Medical): No  Physical Activity: Sufficiently Active (11/24/2023)   Exercise Vital Sign    Days of Exercise per Week: 5 days    Minutes of Exercise per Session: 60 min  Stress: No Stress Concern Present (11/24/2023)   [employer] of Occupational Health - Occupational Stress Questionnaire    Feeling of Stress: Not at all  Social Connections: Socially Integrated (11/24/2023)   Social Connection and Isolation Panel    Frequency of Communication with Friends and Family: Three times a week    Frequency of Social Gatherings with Friends and Family: Twice a week    Attends Religious Services: 1 to 4 times per year    Active Member of Clubs or Organizations: Yes    Attends Banker Meetings: More than 4 times per year    Marital Status: Married  Catering Manager Violence: Not At Risk (09/03/2023)   Epic    Fear of Current or Ex-Partner: No    Emotionally Abused: No    Physically Abused: No    Sexually Abused: No  Depression (PHQ2-9): Low Risk (11/25/2023)   Depression (PHQ2-9)    PHQ-2 Score: 0  Alcohol Screen: Low Risk (11/24/2023)   Alcohol Screen    Last Alcohol Screening Score (AUDIT): 1  Housing: Low Risk (11/24/2023)   Epic    Unable to Pay for Housing in the Last Year: No    Number of Times Moved in the Last Year: 0    Homeless in the Last Year: No  Utilities: Not At Risk (07/23/2023)   Epic    Threatened with loss of  utilities: No  Health Literacy: Adequate Health Literacy (07/23/2023)   B1300 Health Literacy    Frequency of need for help with medical instructions: Never    Past Surgical History:  Procedure Laterality Date   CARDIAC CATHETERIZATION N/A 01/16/2015   Procedure: Left Heart Cath and Coronary Angiography;  Surgeon: Dorn PARAS  Court, MD;  Location: [facility] INVASIVE CV LAB;  Service: Cardiovascular;  Laterality: N/A;   CORONARY STENT PLACEMENT     EP IMPLANTABLE DEVICE N/A 01/10/2016   MDT Claria MRI Quad CRTD implanted by Dr Kelsie   HERNIA REPAIR     INGUINAL HERNIA REPAIR Left 05/22/2020   Procedure: LEFT INGUINAL HERNIA REPAIR WITH MESH;  Surgeon: Vernetta Berg, MD;  Location: [facility] OR;  Service: General;  Laterality: Left;   INSERT / REPLACE / REMOVE PACEMAKER     INSERTION OF MESH Left 05/22/2020   Procedure: INSERTION OF MESH;  Surgeon: Vernetta Berg, MD;  Location: [facility] OR;  Service: General;  Laterality: Left;   IR ANGIOGRAM PELVIS SELECTIVE OR SUPRASELECTIVE  08/28/2022   IR ANGIOGRAM SELECTIVE EACH ADDITIONAL VESSEL  08/28/2022   IR ANGIOGRAM SELECTIVE EACH ADDITIONAL VESSEL  08/28/2022   IR ANGIOGRAM SELECTIVE EACH ADDITIONAL VESSEL  08/28/2022   IR EMBO TUMOR ORGAN ISCHEMIA INFARCT INC GUIDE ROADMAPPING  08/28/2022   IR US  GUIDE VASC ACCESS LEFT  08/28/2022   IR US  GUIDE VASC ACCESS LEFT  08/28/2022   IR US  GUIDE VASC ACCESS LEFT  08/28/2022   IR US  GUIDE VASC ACCESS RIGHT  08/28/2022   TEE WITHOUT CARDIOVERSION N/A 01/26/2015   Procedure: TRANSESOPHAGEAL ECHOCARDIOGRAM (TEE);  Surgeon: Jerel Balding, MD;  Location: [facility] ENDOSCOPY;  Service: Cardiovascular;  Laterality: N/A;   TONSILECTOMY, ADENOIDECTOMY, BILATERAL MYRINGOTOMY AND TUBES     TONSILLECTOMY     WISDOM TOOTH EXTRACTION     WRIST FRACTURE SURGERY Right 1960   3rd grade      Current Outpatient Medications:    Blood Glucose Monitoring Suppl (ONETOUCH VERIO) w/Device KIT, 1 Device by Does not apply route daily.,  Disp: 1 kit, Rfl: 0   carvedilol  (COREG ) 3.125 MG tablet, Take 1 tablet (3.125 mg total) by mouth 2 (two) times daily., Disp: 180 tablet, Rfl: 0   cholecalciferol (VITAMIN D3) 25 MCG (1000 UNIT) tablet, Take 1,000 Units by mouth daily., Disp: , Rfl:    CINNAMON PO, Take 1 tablet by mouth daily., Disp: , Rfl:    clopidogrel  (PLAVIX ) 75 MG tablet, TAKE 1 TABLET(75 MG) BY MOUTH DAILY, Disp: 15 tablet, Rfl: 0   Coenzyme Q10 (COQ10) 200 MG CAPS, Take 200 mg by mouth in the morning., Disp: , Rfl:    Continuous Glucose Sensor (DEXCOM G7 SENSOR) MISC, USE FOR CONTINUOUS BLOOD GLUCOSE MONITORING. REPLACE EVERY 10 DAYS, Disp: 9 each, Rfl: 3   dapagliflozin  propanediol (FARXIGA ) 10 MG TABS tablet, TAKE 1 TABLET(10 MG) BY MOUTH DAILY BEFORE BREAKFAST, Disp: 90 tablet, Rfl: 3   ezetimibe  (ZETIA ) 10 MG tablet, TAKE 1 TABLET(10 MG) BY MOUTH DAILY., Disp: 90 tablet, Rfl: 3   GARLIC PO, Take 1 tablet by mouth daily., Disp: , Rfl:    Glucagon  (GVOKE HYPOPEN  2-PACK) 0.5 MG/0.1ML SOAJ, Use for severe lows, Disp: 0.2 mL, Rfl: 1   glucose blood (ONETOUCH VERIO) test strip, 1 each by Other route 2 (two) times daily. And lancets 2/day, Disp: 200 each, Rfl: 12   HUMALOG  KWIKPEN 100 UNIT/ML KwikPen, INJECT UNDER THE SKIN 7 UNITS EVERY MORNING, 3 UNITS AT LUNCH, AND 7 UNITS AT DINNER., Disp: 30 mL, Rfl: 0   Insulin  Infusion Pump Supplies (TANDEM MOBI AUTOSOFT XC KIT) MISC, , Disp: , Rfl:    insulin  lispro (HUMALOG ) 100 UNIT/ML injection, INJECT 60 UNITS VIA PUMP DAILY, Disp: 10 mL, Rfl: 11   Insulin  Lispro Prot & Lispro (HUMALOG  MIX 50/50 KWIKPEN) (50-50) 100 UNIT/ML  Kwikpen, Inject under the skin 7 units in every morning , 3 units at lunch and 7 units at dinner (Patient not taking: Reported on 03/10/2024), Disp: 30 mL, Rfl: 11   Insulin  Pen Needle (PEN NEEDLES) 32G X 4 MM MISC, Use to inject insulin  4 times a day, Disp: 100 each, Rfl: 2   levothyroxine  (SYNTHROID ) 125 MCG tablet, Take 1 tablet (125 mcg total) by mouth daily.,  Disp: 90 tablet, Rfl: 3   Probiotic Product (PROBIOTIC PO), Take 1 capsule by mouth every morning., Disp: , Rfl:    rosuvastatin  (CRESTOR ) 10 MG tablet, TAKE 1 TABLET(10 MG) BY MOUTH DAILY. MAKE APPOINTMENT FOR FURTHER REFILLS. 2 ND ATTEMPT, Disp: 90 tablet, Rfl: 0   sacubitril -valsartan  (ENTRESTO ) 49-51 MG, Take 1 tablet by mouth 2 (two) times daily., Disp: 180 tablet, Rfl: 0   tadalafil  (CIALIS ) 5 MG tablet, TAKE 1 TABLET(5 MG) BY MOUTH DAILY, Disp: 90 tablet, Rfl: 3    Physical Exam: There were no vitals taken for this visit.    Affect appropriate Healthy:  appears stated age HEENT: normal Neck supple with no adenopathy JVP normal no bruits no thyromegaly Lungs clear with no wheezing and good diaphragmatic motion Heart:  S1/S2 no murmur,enlarged PMI AICD under left clavicle  Abdomen: benighn, post left inguinal hernia repair with mesh  Distal pulses intact with no bruits No edema Neuro non-focal Skin warm and dry No muscular weakness   Labs:   Lab Results  Component Value Date   WBC 8.2 11/25/2023   HGB 16.3 11/25/2023   HCT 49.2 11/25/2023   MCV 98.7 11/25/2023   PLT 165.0 11/25/2023   No results for input(s): NA, K, CL, CO2, BUN, CREATININE, CALCIUM , PROT, BILITOT, ALKPHOS, ALT, AST, GLUCOSE in the last 168 hours.  Invalid input(s): LABALBU Lab Results  Component Value Date   CKTOTAL 192 12/14/2006   CKMB 2.0 12/14/2006   TROPONINI 0.13 (H) 01/15/2015    Lab Results  Component Value Date   CHOL 136 07/14/2023   CHOL 157 05/01/2022   CHOL 134 10/26/2021   Lab Results  Component Value Date   HDL 71 07/14/2023   HDL 61 05/01/2022   HDL 56 10/26/2021   Lab Results  Component Value Date   LDLCALC 50 07/14/2023   LDLCALC 77 05/01/2022   LDLCALC 63 10/26/2021   Lab Results  Component Value Date   TRIG 70 07/14/2023   TRIG 105 05/01/2022   TRIG 75 10/26/2021   Lab Results  Component Value Date   CHOLHDL 1.9 07/14/2023    CHOLHDL 2.6 05/01/2022   CHOLHDL 2.4 10/26/2021   Lab Results  Component Value Date   LDLDIRECT 85.0 07/11/2017   LDLDIRECT 90.0 02/17/2015   LDLDIRECT 100.0 12/23/2014      Radiology: MR ABDOMEN WWO CONTRAST Result Date: 02/24/2024 CLINICAL DATA:  Neoplasm of uncertain behavior of right kidney. EXAM: MRI ABDOMEN WITHOUT AND WITH CONTRAST TECHNIQUE: Multiplanar multisequence MR imaging of the abdomen was performed both before and after the administration of intravenous contrast. CONTRAST:  7.5mL GADAVIST  GADOBUTROL  1 MMOL/ML IV SOLN COMPARISON:  MRI abdomen from 09/30/2023. FINDINGS: Lower chest: Unremarkable MR appearance to the lung bases. No pleural effusion. No pericardial effusion. Normal heart size. Hepatobiliary: The liver is normal in size. Noncirrhotic configuration. No focal liver lesion. No intrahepatic or extrahepatic bile duct dilatation. No choledocholithiasis. Unremarkable gallbladder. Pancreas: Normal T1 hyperintense signal intensity of the pancreas. Redemonstration of 5 x 9 mm T2 hyperintense lesion in the pancreatic head. No  direct communication of the lesion identified within the pancreatic side branch or main duct however, this is unchanged since the prior study and favored to represent pancreatic side-branch IPMN. No mural nodule or internal enhancement on the postcontrast images. There is an additional 3 mm cystic focus in the pancreatic tail (series 10, image 19), which is also unchanged since the prior study. No other focal pancreatic lesion. No peripancreatic fat stranding. Main pancreatic duct is not dilated. Spleen:  Within normal limits in size and appearance. No focal mass. Adrenals/Urinary Tract: Unremarkable adrenal glands. No hydroureteronephrosis. Redemonstration of partially exophytic heterogeneous 1.7 x 2.1 cm lesion arising from the right kidney interpolar region, laterally. The lesion exhibits heterogeneous enhancement and is compatible with renal neoplasm. Right  renal vein is patent. No perirenal lymphadenopathy. No suspicious mass in the left kidney. There is a simple cyst arising from the right kidney interpolar region, anteriorly measuring up to 3.2 x 3.3 cm. Stomach/Bowel: Visualized portions within the abdomen are unremarkable. No disproportionate dilation of bowel loops. There is tiny sliding hiatal hernia. Moderate stool burden noted. Vascular/Lymphatic: No pathologically enlarged lymph nodes identified. No abdominal aortic aneurysm demonstrated. No ascites. Other:  None. Musculoskeletal: No suspicious bone lesions identified. IMPRESSION: 1. Redemonstration of heterogeneous enhancing 1.7 x 2.1 cm lesion arising from the right kidney interpolar region, laterally, compatible with renal neoplasm. No evidence of metastatic disease within the abdomen. 2. Redemonstration of subcentimeter sized cystic lesions in the pancreas, as described above, favored to represent pancreatic side-branch IPMN. Follow-up examination with MRI abdomen with MRCP protocol is recommended in 2 years to document stability. 3. Multiple other nonacute observations, as described above. Electronically Signed   By: Ree Molt M.D.   On: 02/24/2024 12:18    EKG: A pacing RBBB QRS 120 msec    ASSESSMENT AND PLAN:  CHF:  EF 35-40% by TTE 09/02/21  functional class one On good GDMT will update echo  CAD:  Post large anterior MI with stenting of LAD 20 plusyears ago and SEMI stenting ?RCA in 2016 no angina continue medical Rx *** CRT-AICD:  F/u EP Implant 2017 some AVNRT VT zone increased f/u Dr Nancey  HLD:  LDL at goal on crestor   DM:  Discussed low carb diet.  Target hemoglobin A1c is 6.5 or less.  Continue current medications. Last  Thyroid :  On replacement with synthroid   Hernia:  post left inguinal repair improved  Preoperative. IR renal biopsy for presumed localized right pole neoplasm. Ok to hold plavix  5 days before procedure ***  Echo for ischemic DCM  F/U with EP 6 months and  me in a year   Signed: Maude Emmer 03/12/2024, 5:01 PM

## 2024-03-22 ENCOUNTER — Encounter (HOSPITAL_COMMUNITY)

## 2024-03-22 ENCOUNTER — Ambulatory Visit: Admitting: Endocrinology

## 2024-03-26 ENCOUNTER — Ambulatory Visit: Admitting: Cardiovascular Disease

## 2024-03-26 ENCOUNTER — Ambulatory Visit: Admitting: Student

## 2024-03-31 ENCOUNTER — Ambulatory Visit (HOSPITAL_COMMUNITY)

## 2024-03-31 ENCOUNTER — Ambulatory Visit (HOSPITAL_COMMUNITY): Admit: 2024-03-31

## 2024-04-07 ENCOUNTER — Ambulatory Visit

## 2024-07-07 ENCOUNTER — Ambulatory Visit

## 2024-07-27 ENCOUNTER — Ambulatory Visit

## 2024-10-06 ENCOUNTER — Ambulatory Visit

## 2024-11-30 ENCOUNTER — Encounter: Admitting: Family Medicine

## 2025-01-05 ENCOUNTER — Ambulatory Visit

## 2025-04-06 ENCOUNTER — Ambulatory Visit
